# Patient Record
Sex: Male | Born: 1955 | Race: Black or African American | Hispanic: No | Smoking: Current every day smoker
Health system: Southern US, Community
[De-identification: ages and names within clinical notes are randomized; demographics above are authoritative.]

## PROBLEM LIST (undated history)

## (undated) ENCOUNTER — Emergency Department: Discharge status: Absent without leave | Payer: Medicaid Other

## (undated) DIAGNOSIS — K859 Acute pancreatitis without necrosis or infection, unspecified: Secondary | ICD-10-CM

## (undated) DIAGNOSIS — I509 Heart failure, unspecified: Secondary | ICD-10-CM

## (undated) DIAGNOSIS — I480 Paroxysmal atrial fibrillation: Secondary | ICD-10-CM

## (undated) DIAGNOSIS — I10 Essential (primary) hypertension: Secondary | ICD-10-CM

## (undated) DIAGNOSIS — Z789 Other specified health status: Secondary | ICD-10-CM

## (undated) DIAGNOSIS — I219 Acute myocardial infarction, unspecified: Secondary | ICD-10-CM

## (undated) DIAGNOSIS — U071 COVID-19: Secondary | ICD-10-CM

## (undated) DIAGNOSIS — J449 Chronic obstructive pulmonary disease, unspecified: Secondary | ICD-10-CM

## (undated) DIAGNOSIS — I2510 Atherosclerotic heart disease of native coronary artery without angina pectoris: Secondary | ICD-10-CM

## (undated) DIAGNOSIS — F1090 Alcohol use, unspecified, uncomplicated: Secondary | ICD-10-CM

## (undated) HISTORY — DX: Essential (primary) hypertension: I10

## (undated) HISTORY — DX: Atherosclerotic heart disease of native coronary artery without angina pectoris: I25.10

## (undated) HISTORY — DX: Chronic obstructive pulmonary disease, unspecified: J44.9

## (undated) HISTORY — DX: Alcohol use, unspecified, uncomplicated: F10.90

## (undated) HISTORY — PX: PARTIAL COLECTOMY: SHX5273

## (undated) HISTORY — DX: Other specified health status: Z78.9

## (undated) HISTORY — DX: Heart failure, unspecified: I50.9

---

## 2005-05-07 ENCOUNTER — Emergency Department: Payer: Self-pay | Admitting: Emergency Medicine

## 2005-07-15 ENCOUNTER — Emergency Department: Payer: Self-pay | Admitting: Emergency Medicine

## 2005-07-18 ENCOUNTER — Emergency Department: Payer: Self-pay | Admitting: Emergency Medicine

## 2005-07-25 ENCOUNTER — Emergency Department: Payer: Self-pay | Admitting: Emergency Medicine

## 2018-11-11 ENCOUNTER — Emergency Department: Payer: Medicaid Other

## 2018-11-11 ENCOUNTER — Other Ambulatory Visit: Payer: Self-pay

## 2018-11-11 ENCOUNTER — Encounter: Payer: Self-pay | Admitting: Radiology

## 2018-11-11 ENCOUNTER — Inpatient Hospital Stay
Admission: EM | Admit: 2018-11-11 | Discharge: 2018-11-25 | DRG: 871 | Disposition: A | Discharge status: Home / Self Care | Payer: Medicaid Other | Attending: Internal Medicine | Admitting: Internal Medicine

## 2018-11-11 DIAGNOSIS — E860 Dehydration: Secondary | ICD-10-CM | POA: Diagnosis present

## 2018-11-11 DIAGNOSIS — M461 Sacroiliitis, not elsewhere classified: Secondary | ICD-10-CM | POA: Diagnosis present

## 2018-11-11 DIAGNOSIS — J850 Gangrene and necrosis of lung: Secondary | ICD-10-CM | POA: Diagnosis present

## 2018-11-11 DIAGNOSIS — J690 Pneumonitis due to inhalation of food and vomit: Secondary | ICD-10-CM | POA: Diagnosis present

## 2018-11-11 DIAGNOSIS — M109 Gout, unspecified: Secondary | ICD-10-CM | POA: Diagnosis present

## 2018-11-11 DIAGNOSIS — J918 Pleural effusion in other conditions classified elsewhere: Secondary | ICD-10-CM | POA: Diagnosis present

## 2018-11-11 DIAGNOSIS — Z20828 Contact with and (suspected) exposure to other viral communicable diseases: Secondary | ICD-10-CM | POA: Diagnosis present

## 2018-11-11 DIAGNOSIS — E876 Hypokalemia: Secondary | ICD-10-CM | POA: Diagnosis present

## 2018-11-11 DIAGNOSIS — Z809 Family history of malignant neoplasm, unspecified: Secondary | ICD-10-CM

## 2018-11-11 DIAGNOSIS — J869 Pyothorax without fistula: Secondary | ICD-10-CM | POA: Diagnosis not present

## 2018-11-11 DIAGNOSIS — I248 Other forms of acute ischemic heart disease: Secondary | ICD-10-CM | POA: Diagnosis not present

## 2018-11-11 DIAGNOSIS — R1011 Right upper quadrant pain: Secondary | ICD-10-CM

## 2018-11-11 DIAGNOSIS — D65 Disseminated intravascular coagulation [defibrination syndrome]: Secondary | ICD-10-CM | POA: Diagnosis not present

## 2018-11-11 DIAGNOSIS — E43 Unspecified severe protein-calorie malnutrition: Secondary | ICD-10-CM | POA: Diagnosis present

## 2018-11-11 DIAGNOSIS — I4891 Unspecified atrial fibrillation: Secondary | ICD-10-CM | POA: Diagnosis present

## 2018-11-11 DIAGNOSIS — F17200 Nicotine dependence, unspecified, uncomplicated: Secondary | ICD-10-CM | POA: Diagnosis present

## 2018-11-11 DIAGNOSIS — F17210 Nicotine dependence, cigarettes, uncomplicated: Secondary | ICD-10-CM | POA: Diagnosis present

## 2018-11-11 DIAGNOSIS — A419 Sepsis, unspecified organism: Secondary | ICD-10-CM

## 2018-11-11 DIAGNOSIS — Z6823 Body mass index (BMI) 23.0-23.9, adult: Secondary | ICD-10-CM

## 2018-11-11 DIAGNOSIS — E872 Acidosis: Secondary | ICD-10-CM | POA: Diagnosis present

## 2018-11-11 DIAGNOSIS — K8020 Calculus of gallbladder without cholecystitis without obstruction: Secondary | ICD-10-CM | POA: Diagnosis present

## 2018-11-11 DIAGNOSIS — R7989 Other specified abnormal findings of blood chemistry: Secondary | ICD-10-CM

## 2018-11-11 DIAGNOSIS — R079 Chest pain, unspecified: Secondary | ICD-10-CM | POA: Diagnosis not present

## 2018-11-11 DIAGNOSIS — Z841 Family history of disorders of kidney and ureter: Secondary | ICD-10-CM

## 2018-11-11 DIAGNOSIS — B179 Acute viral hepatitis, unspecified: Secondary | ICD-10-CM | POA: Diagnosis not present

## 2018-11-11 DIAGNOSIS — J90 Pleural effusion, not elsewhere classified: Secondary | ICD-10-CM | POA: Diagnosis not present

## 2018-11-11 DIAGNOSIS — F1010 Alcohol abuse, uncomplicated: Secondary | ICD-10-CM | POA: Diagnosis present

## 2018-11-11 DIAGNOSIS — E46 Unspecified protein-calorie malnutrition: Secondary | ICD-10-CM | POA: Diagnosis not present

## 2018-11-11 DIAGNOSIS — A408 Other streptococcal sepsis: Secondary | ICD-10-CM | POA: Diagnosis present

## 2018-11-11 DIAGNOSIS — Z09 Encounter for follow-up examination after completed treatment for conditions other than malignant neoplasm: Secondary | ICD-10-CM

## 2018-11-11 DIAGNOSIS — B954 Other streptococcus as the cause of diseases classified elsewhere: Secondary | ICD-10-CM | POA: Diagnosis not present

## 2018-11-11 DIAGNOSIS — R945 Abnormal results of liver function studies: Secondary | ICD-10-CM | POA: Diagnosis not present

## 2018-11-11 DIAGNOSIS — K7010 Alcoholic hepatitis without ascites: Secondary | ICD-10-CM | POA: Diagnosis present

## 2018-11-11 DIAGNOSIS — Z978 Presence of other specified devices: Secondary | ICD-10-CM | POA: Diagnosis not present

## 2018-11-11 DIAGNOSIS — N179 Acute kidney failure, unspecified: Secondary | ICD-10-CM | POA: Diagnosis present

## 2018-11-11 DIAGNOSIS — R52 Pain, unspecified: Secondary | ICD-10-CM

## 2018-11-11 DIAGNOSIS — D72829 Elevated white blood cell count, unspecified: Secondary | ICD-10-CM | POA: Diagnosis not present

## 2018-11-11 DIAGNOSIS — R7401 Elevation of levels of liver transaminase levels: Secondary | ICD-10-CM | POA: Diagnosis not present

## 2018-11-11 DIAGNOSIS — K760 Fatty (change of) liver, not elsewhere classified: Secondary | ICD-10-CM | POA: Diagnosis not present

## 2018-11-11 LAB — COMPREHENSIVE METABOLIC PANEL
ALT: 309 U/L — ABNORMAL HIGH (ref 0–44)
AST: 1065 U/L — ABNORMAL HIGH (ref 15–41)
Albumin: 2.9 g/dL — ABNORMAL LOW (ref 3.5–5.0)
Alkaline Phosphatase: 141 U/L — ABNORMAL HIGH (ref 38–126)
Anion gap: 26 — ABNORMAL HIGH (ref 5–15)
BUN: 27 mg/dL — ABNORMAL HIGH (ref 8–23)
CO2: 17 mmol/L — ABNORMAL LOW (ref 22–32)
Calcium: 9.2 mg/dL (ref 8.9–10.3)
Chloride: 95 mmol/L — ABNORMAL LOW (ref 98–111)
Creatinine, Ser: 2.04 mg/dL — ABNORMAL HIGH (ref 0.61–1.24)
GFR calc Af Amer: 39 mL/min — ABNORMAL LOW (ref >60)
GFR calc non Af Amer: 34 mL/min — ABNORMAL LOW (ref >60)
Glucose, Bld: 53 mg/dL — ABNORMAL LOW (ref 70–99)
Potassium: 2.4 mmol/L — CL (ref 3.5–5.1)
Sodium: 138 mmol/L (ref 135–145)
Total Bilirubin: 2.4 mg/dL — ABNORMAL HIGH (ref 0.3–1.2)
Total Protein: 7.7 g/dL (ref 6.5–8.1)

## 2018-11-11 LAB — CBC WITH DIFFERENTIAL/PLATELET
Abs Immature Granulocytes: 0 10*3/uL (ref 0.00–0.07)
Basophils Absolute: 0 10*3/uL (ref 0.0–0.1)
Basophils Relative: 0 %
Eosinophils Absolute: 0 10*3/uL (ref 0.0–0.5)
Eosinophils Relative: 0 %
HCT: 49.1 % (ref 39.0–52.0)
Hemoglobin: 16.1 g/dL (ref 13.0–17.0)
Lymphocytes Relative: 3 %
Lymphs Abs: 0.7 10*3/uL (ref 0.7–4.0)
MCH: 31.3 pg (ref 26.0–34.0)
MCHC: 32.8 g/dL (ref 30.0–36.0)
MCV: 95.5 fL (ref 80.0–100.0)
Monocytes Absolute: 1.2 10*3/uL — ABNORMAL HIGH (ref 0.1–1.0)
Monocytes Relative: 5 %
Neutro Abs: 21.3 10*3/uL — ABNORMAL HIGH (ref 1.7–7.7)
Neutrophils Relative %: 87 %
Other: 5 %
Platelets: 363 10*3/uL (ref 150–400)
RBC: 5.14 MIL/uL (ref 4.22–5.81)
RDW: 13.9 % (ref 11.5–15.5)
WBC: 24.7 10*3/uL — ABNORMAL HIGH (ref 4.0–10.5)
nRBC: 0.1 % (ref 0.0–0.2)

## 2018-11-11 LAB — T4, FREE: Free T4: 1.27 ng/dL — ABNORMAL HIGH (ref 0.61–1.12)

## 2018-11-11 LAB — ACETAMINOPHEN LEVEL: Acetaminophen (Tylenol), Serum: 12 ug/mL (ref 10–30)

## 2018-11-11 LAB — LACTIC ACID, PLASMA
Lactic Acid, Venous: 6.7 mmol/L (ref 0.5–1.9)
Lactic Acid, Venous: 5.1 mmol/L (ref 0.5–1.9)

## 2018-11-11 LAB — TROPONIN I (HIGH SENSITIVITY): Troponin I (High Sensitivity): 41 ng/L — ABNORMAL HIGH (ref <18)

## 2018-11-11 LAB — ETHANOL: Alcohol, Ethyl (B): <10 mg/dL (ref <10)

## 2018-11-11 LAB — MAGNESIUM: Magnesium: 1.5 mg/dL — ABNORMAL LOW (ref 1.7–2.4)

## 2018-11-11 LAB — PROTIME-INR
INR: 2.5 — ABNORMAL HIGH (ref 0.8–1.2)
Prothrombin Time: 26.3 seconds — ABNORMAL HIGH (ref 11.4–15.2)

## 2018-11-11 LAB — APTT: aPTT: 39 seconds — ABNORMAL HIGH (ref 24–36)

## 2018-11-11 LAB — FIBRIN DERIVATIVES D-DIMER (ARMC ONLY): Fibrin derivatives D-dimer (ARMC): >7500 ng/mL (FEU) — ABNORMAL HIGH (ref 0.00–499.00)

## 2018-11-11 LAB — TSH: TSH: 0.985 u[IU]/mL (ref 0.350–4.500)

## 2018-11-11 MED ORDER — SODIUM CHLORIDE 0.9 % IV BOLUS
1000.0000 mL | Freq: Once | INTRAVENOUS | Status: AC
  Administered 2018-11-11: 1000 mL via INTRAVENOUS

## 2018-11-11 MED ORDER — MORPHINE SULFATE (PF) 2 MG/ML IV SOLN
2.0000 mg | INTRAVENOUS | Status: DC | PRN
  Administered 2018-11-12: 2 mg via INTRAVENOUS
  Filled 2018-11-11: qty 1

## 2018-11-11 MED ORDER — ACETAMINOPHEN 650 MG RE SUPP: 650.0000 mg | Freq: Four times a day (QID) | RECTAL | Status: DC | PRN

## 2018-11-11 MED ORDER — SODIUM CHLORIDE 0.9 % IV SOLN
2.0000 g | Freq: Once | INTRAVENOUS | Status: AC
  Administered 2018-11-11: 2 g via INTRAVENOUS
  Filled 2018-11-11 (×2): qty 2

## 2018-11-11 MED ORDER — PIPERACILLIN-TAZOBACTAM 3.375 G IVPB 30 MIN
3.3750 g | INTRAVENOUS | Status: AC
  Administered 2018-11-11: 3.375 g via INTRAVENOUS
  Filled 2018-11-11: qty 50

## 2018-11-11 MED ORDER — ONDANSETRON HCL 4 MG/2ML IJ SOLN
4.0000 mg | Freq: Once | INTRAMUSCULAR | Status: AC
  Administered 2018-11-11: 21:00:00 4 mg via INTRAVENOUS

## 2018-11-11 MED ORDER — NICOTINE 21 MG/24HR TD PT24: 21.0000 mg | MEDICATED_PATCH | Freq: Every day | TRANSDERMAL | Status: DC

## 2018-11-11 MED ORDER — ONDANSETRON HCL 4 MG/2ML IJ SOLN
INTRAMUSCULAR | Status: AC
  Administered 2018-11-11: 4 mg via INTRAVENOUS
  Filled 2018-11-11: qty 2

## 2018-11-11 MED ORDER — ACETAMINOPHEN 325 MG PO TABS: 650.0000 mg | ORAL_TABLET | Freq: Four times a day (QID) | ORAL | Status: DC | PRN

## 2018-11-11 MED ORDER — IOHEXOL 350 MG/ML SOLN
75.0000 mL | Freq: Once | INTRAVENOUS | Status: AC | PRN
  Administered 2018-11-11: 60 mL via INTRAVENOUS

## 2018-11-11 MED ORDER — MAGNESIUM SULFATE 2 GM/50ML IV SOLN
2.0000 g | Freq: Once | INTRAVENOUS | Status: AC
  Administered 2018-11-11: 2 g via INTRAVENOUS
  Filled 2018-11-11: qty 50

## 2018-11-11 MED ORDER — DILTIAZEM HCL 25 MG/5ML IV SOLN
15.0000 mg | Freq: Once | INTRAVENOUS | Status: AC
  Administered 2018-11-11: 15 mg via INTRAVENOUS

## 2018-11-11 MED ORDER — ENOXAPARIN SODIUM 40 MG/0.4ML ~~LOC~~ SOLN: 40.0000 mg | SUBCUTANEOUS | Status: DC

## 2018-11-11 MED ORDER — POTASSIUM CHLORIDE CRYS ER 20 MEQ PO TBCR
40.0000 meq | EXTENDED_RELEASE_TABLET | Freq: Once | ORAL | Status: AC
  Administered 2018-11-11: 40 meq via ORAL
  Filled 2018-11-11: qty 2

## 2018-11-11 MED ORDER — ONDANSETRON HCL 4 MG PO TABS: 4.0000 mg | ORAL_TABLET | Freq: Four times a day (QID) | ORAL | Status: DC | PRN

## 2018-11-11 MED ORDER — VANCOMYCIN HCL 10 G IV SOLR
1750.0000 mg | Freq: Once | INTRAVENOUS | Status: AC
  Administered 2018-11-11: 23:00:00 1750 mg via INTRAVENOUS
  Filled 2018-11-11 (×2): qty 1750

## 2018-11-11 MED ORDER — ALUM & MAG HYDROXIDE-SIMETH 200-200-20 MG/5ML PO SUSP
30.0000 mL | Freq: Once | ORAL | Status: AC
  Administered 2018-11-11: 30 mL via ORAL
  Filled 2018-11-11: qty 30

## 2018-11-11 MED ORDER — OXYCODONE HCL 5 MG/5ML PO SOLN
5.0000 mg | Freq: Once | ORAL | Status: AC
  Administered 2018-11-11: 5 mg via ORAL
  Filled 2018-11-11: qty 5

## 2018-11-11 MED ORDER — METRONIDAZOLE IN NACL 5-0.79 MG/ML-% IV SOLN
500.0000 mg | Freq: Once | INTRAVENOUS | Status: DC
  Filled 2018-11-11: qty 100

## 2018-11-11 MED ORDER — POTASSIUM CHLORIDE 10 MEQ/100ML IV SOLN
10.0000 meq | INTRAVENOUS | Status: AC
  Filled 2018-11-11 (×3): qty 100

## 2018-11-11 MED ORDER — POTASSIUM CHLORIDE IN NACL 20-0.9 MEQ/L-% IV SOLN
INTRAVENOUS | Status: DC
  Administered 2018-11-12 – 2018-11-14 (×3): via INTRAVENOUS
  Filled 2018-11-11 (×10): qty 1000

## 2018-11-11 MED ORDER — DILTIAZEM HCL 25 MG/5ML IV SOLN
25.0000 mg | Freq: Once | INTRAVENOUS | Status: AC
  Administered 2018-11-11: 15 mg via INTRAVENOUS

## 2018-11-11 MED ORDER — VANCOMYCIN HCL IN DEXTROSE 1-5 GM/200ML-% IV SOLN
1000.0000 mg | Freq: Once | INTRAVENOUS | Status: DC
  Filled 2018-11-11: qty 200

## 2018-11-11 MED ORDER — ONDANSETRON HCL 4 MG/2ML IJ SOLN: 4.0000 mg | Freq: Four times a day (QID) | INTRAMUSCULAR | Status: DC | PRN

## 2018-11-11 MED ORDER — POTASSIUM CHLORIDE 10 MEQ/100ML IV SOLN
10.0000 meq | INTRAVENOUS | Status: AC
  Administered 2018-11-11 – 2018-11-12 (×4): 10 meq via INTRAVENOUS
  Filled 2018-11-11 (×4): qty 100

## 2018-11-11 NOTE — H&P (Addendum)
[facility] [place] [place] NAME: Thomas Coffey    MR#:  [id number]  DATE OF BIRTH:  [birth year]  DATE OF ADMISSION:  11/11/2018  PRIMARY CARE PHYSICIAN: Patient, [name]   REQUESTING/REFERRING PHYSICIAN: Dr. Marjean Donna  CHIEF COMPLAINT:   Chief Complaint  Patient presents with   Chest Pain    HISTORY OF PRESENT ILLNESS:  Thomas Coffey  is a 63 y.o. male with history of tobacco abuse who presents to the hospital complaining of vague epigastric/right upper quadrant/chest pain.  Patient says he was in his usual state of health and developed worsening symptoms yesterday late evening which progressed today and he had associated vomiting twice today.  His vomitus was nonbloody and bilious in nature.  Patient was not feeling better and therefore came to the ER for further evaluation.  In the emergency room patient underwent blood work which showed acute kidney injury with abnormal LFTs and ultrasound findings suggestive of suspected cholelithiasis with cholecystitis but given his acute kidney injury and the fact that he was in A. fib with RVR surgery recommended admission by hospitalist services.  Patient was given 1 dose of IV Cardizem in the ER for his atrial fibrillation and he has now converted to normal sinus rhythm.  Given patient's elevated D-dimer and atypical symptoms he underwent a CTA of the chest abdomen pelvis which is not suggestive of a necrotizing left-sided pneumonia with empyema.  Patient does admit to some exertional dyspnea, no night sweats, chills but no documented fever.  Given his abnormal imaging findings and blood work hospitalist services were contacted for admission.  PAST MEDICAL HISTORY:  History reviewed. No pertinent past medical history.  PAST SURGICAL HISTORY:  None  SOCIAL HISTORY:   Social History   Tobacco Use   Smoking status: Current Every Day Smoker    Packs/day: 0.50    Years: 30.00    Pack years: 15.00     Types: Cigarettes  Substance Use Topics   Alcohol use: Yes    Comment: socially    FAMILY HISTORY:   Family History  Problem Relation Age of Onset   Cancer Mother    Kidney disease Brother     DRUG ALLERGIES:  No Known Allergies  REVIEW OF SYSTEMS:   Review of Systems  Constitutional: Negative for fever and weight loss.  HENT: Negative for congestion, nosebleeds and tinnitus.   Eyes: Negative for blurred vision, double vision and redness.  Respiratory: Negative for cough, hemoptysis and shortness of breath.   Cardiovascular: Negative for chest pain, orthopnea, leg swelling and PND.  Gastrointestinal: Positive for abdominal pain, nausea and vomiting. Negative for diarrhea and melena.  Genitourinary: Negative for dysuria, hematuria and urgency.  Musculoskeletal: Negative for falls and joint pain.  Neurological: Negative for dizziness, tingling, sensory change, focal weakness, seizures, weakness and headaches.  Endo/Heme/Allergies: Negative for polydipsia. Does not bruise/bleed easily.  Psychiatric/Behavioral: Negative for depression and memory loss. The patient is not nervous/anxious.     MEDICATIONS AT HOME:   Prior to Admission medications   Medication Sig Start Date End Date Taking? Authorizing Provider  acetaminophen-codeine (TYLENOL #3) 300-30 MG tablet Take 1 tablet by mouth every 6 (six) hours as needed for pain. 11/08/18 11/13/18 Yes [provider]      VITAL SIGNS:  Blood pressure 101/60, pulse (!) 108, resp. rate (!) 22, weight 79.4 kg, SpO2 91 %.  PHYSICAL EXAMINATION:  Physical Exam  GENERAL:  63 y.o.-year-old patient lying  in the bed in no acute distress.  EYES: Pupils equal, round, reactive to light and accommodation. No scleral icterus. Extraocular muscles intact.  HEENT: Head atraumatic, normocephalic. Oropharynx and nasopharynx clear. No oropharyngeal erythema, moist oral mucosa  NECK:  Supple, no jugular venous distention. No thyroid  enlargement, no tenderness.  LUNGS: Normal breath sounds bilaterally, no wheezing, rales, rhonchi. No use of accessory muscles of respiration.  CARDIOVASCULAR: S1, S2 RRR. No murmurs, rubs, gallops, clicks.  ABDOMEN: Soft, Tender in RUQ, Epigastric area. No rebound, rigidity, nondistended. Bowel sounds present. No organomegaly or mass.  EXTREMITIES: No pedal edema, cyanosis, or clubbing. + 2 pedal & radial pulses b/l.   NEUROLOGIC: Cranial nerves II through XII are intact. No focal Motor or sensory deficits appreciated b/l PSYCHIATRIC: The patient is alert and oriented x 3.  SKIN: No obvious rash, lesion, or ulcer.   LABORATORY PANEL:   CBC Recent Labs  Lab 11/11/18 1836  WBC 24.7*  HGB 16.1  HCT 49.1  PLT 363   ------------------------------------------------------------------------------------------------------------------  Chemistries  Recent Labs  Lab 11/11/18 1836  NA 138  K 2.4*  CL 95*  CO2 17*  GLUCOSE 53*  BUN 27*  CREATININE 2.04*  CALCIUM 9.2  MG 1.5*  AST 1,065*  ALT 309*  ALKPHOS 141*  BILITOT 2.4*   ------------------------------------------------------------------------------------------------------------------  Cardiac Enzymes No results for input(s): TROPONINI in the last 168 hours. ------------------------------------------------------------------------------------------------------------------  RADIOLOGY:  Dg Chest 1 View  Result Date: 11/11/2018 CLINICAL DATA:  Chest pain EXAM: CHEST  1 VIEW COMPARISON:  07/15/2005 FINDINGS: Defibrillator pads overlie the left lower chest. Increased left basilar ill-defined opacity obscures the left hemidiaphragm may represent atelectasis and/or consolidation. Difficult to exclude left lower lung pneumonia. Right lung remains clear. Normal heart size and vascularity. No large effusion or pneumothorax. Trachea midline. Aorta atherosclerotic. Degenerative changes of the spine. IMPRESSION: Increased left lower lung  ill-defined opacity compatible with atelectasis or developing airspace disease. Electronically Signed   By: Judie Petit.  Shick M.D.   On: 11/11/2018 19:28   Ct Angio Chest Pe W And/or Wo Contrast  Result Date: 11/11/2018 CLINICAL DATA:  Abdominal pain and complex chest pain. EXAM: CT ANGIOGRAPHY CHEST CT ABDOMEN AND PELVIS WITH CONTRAST TECHNIQUE: Multidetector CT imaging of the chest was performed using the standard protocol during bolus administration of intravenous contrast. Multiplanar CT image reconstructions and MIPs were obtained to evaluate the vascular anatomy. Multidetector CT imaging of the abdomen and pelvis was performed using the standard protocol during bolus administration of intravenous contrast. CONTRAST:  45mL OMNIPAQUE IOHEXOL 350 MG/ML SOLN COMPARISON:  CT dated July 15, 2005 FINDINGS: CTA CHEST FINDINGS Cardiovascular: Contrast injection is sufficient to demonstrate satisfactory opacification of the pulmonary arteries to the segmental level. There is no pulmonary embolus. The main pulmonary artery is within normal limits for size. There is no CT evidence of acute right heart strain. The visualized aorta is normal. Heart size is normal, without pericardial effusion. Mediastinum/Nodes: --there is some mildly prominent mediastinal lymph nodes, likely reactive. --No axillary lymphadenopathy. --No supraclavicular lymphadenopathy. --Normal thyroid gland. --The esophagus is unremarkable Lungs/Pleura: There is a large complex left-sided empyema with multiple pockets of gas scattered throughout the collection both in a dependent and nondependent location. There is consolidation of the left lower lobe with findings raising concern for necrotizing pneumonia. Moderate emphysematous changes are noted bilaterally. There is a trace right-sided pleural effusion there is some mild interlobular septal thickening. There is some debris within the trachea. Musculoskeletal: No chest wall abnormality. No  acute or  significant osseous findings. Review of the MIP images confirms the above findings. CT ABDOMEN and PELVIS FINDINGS Hepatobiliary: There is decreased hepatic attenuation suggestive of hepatic steatosis. Normal gallbladder.There is no biliary ductal dilation. Pancreas: Normal contours without ductal dilatation. No peripancreatic fluid collection. Spleen: No splenic laceration or hematoma. Adrenals/Urinary Tract: --Adrenal glands: No adrenal hemorrhage. --Right kidney/ureter: No hydronephrosis or perinephric hematoma. --Left kidney/ureter: No hydronephrosis or perinephric hematoma. --Urinary bladder: Unremarkable. Stomach/Bowel: --Stomach/Duodenum: There is moderate distention of the stomach with an air-fluid level --Small bowel: No dilatation or inflammation. --Colon: The patient is status post right hemicolectomy. --Appendix: Surgically absent. Vascular/Lymphatic: Normal course and caliber of the major abdominal vessels. --No retroperitoneal lymphadenopathy. --No mesenteric lymphadenopathy. --No pelvic or inguinal lymphadenopathy. Reproductive: Unremarkable Other: No ascites or free air. The abdominal wall is normal. Musculoskeletal. No acute displaced fractures. Review of the MIP images confirms the above findings. IMPRESSION: 1. Large complex left-sided empyema. 2. Consolidation of the left lower lobe with findings raising concern for necrotizing pneumonia. 3. Trace right-sided pleural effusion. 4. No acute intra-abdominal or pelvic process. 5. Hepatic steatosis. 6. Moderate distention of the stomach. 7. Status post right hemicolectomy. Aortic Atherosclerosis (ICD10-I70.0) and Emphysema (ICD10-J43.9). Electronically Signed   By: Katherine Mantle M.D.   On: 11/11/2018 21:37   Ct Abdomen Pelvis W Contrast  Result Date: 11/11/2018 CLINICAL DATA:  Abdominal pain and complex chest pain. EXAM: CT ANGIOGRAPHY CHEST CT ABDOMEN AND PELVIS WITH CONTRAST TECHNIQUE: Multidetector CT imaging of the chest was performed  using the standard protocol during bolus administration of intravenous contrast. Multiplanar CT image reconstructions and MIPs were obtained to evaluate the vascular anatomy. Multidetector CT imaging of the abdomen and pelvis was performed using the standard protocol during bolus administration of intravenous contrast. CONTRAST:  18mL OMNIPAQUE IOHEXOL 350 MG/ML SOLN COMPARISON:  CT dated July 15, 2005 FINDINGS: CTA CHEST FINDINGS Cardiovascular: Contrast injection is sufficient to demonstrate satisfactory opacification of the pulmonary arteries to the segmental level. There is no pulmonary embolus. The main pulmonary artery is within normal limits for size. There is no CT evidence of acute right heart strain. The visualized aorta is normal. Heart size is normal, without pericardial effusion. Mediastinum/Nodes: --there is some mildly prominent mediastinal lymph nodes, likely reactive. --No axillary lymphadenopathy. --No supraclavicular lymphadenopathy. --Normal thyroid gland. --The esophagus is unremarkable Lungs/Pleura: There is a large complex left-sided empyema with multiple pockets of gas scattered throughout the collection both in a dependent and nondependent location. There is consolidation of the left lower lobe with findings raising concern for necrotizing pneumonia. Moderate emphysematous changes are noted bilaterally. There is a trace right-sided pleural effusion there is some mild interlobular septal thickening. There is some debris within the trachea. Musculoskeletal: No chest wall abnormality. No acute or significant osseous findings. Review of the MIP images confirms the above findings. CT ABDOMEN and PELVIS FINDINGS Hepatobiliary: There is decreased hepatic attenuation suggestive of hepatic steatosis. Normal gallbladder.There is no biliary ductal dilation. Pancreas: Normal contours without ductal dilatation. No peripancreatic fluid collection. Spleen: No splenic laceration or hematoma.  Adrenals/Urinary Tract: --Adrenal glands: No adrenal hemorrhage. --Right kidney/ureter: No hydronephrosis or perinephric hematoma. --Left kidney/ureter: No hydronephrosis or perinephric hematoma. --Urinary bladder: Unremarkable. Stomach/Bowel: --Stomach/Duodenum: There is moderate distention of the stomach with an air-fluid level --Small bowel: No dilatation or inflammation. --Colon: The patient is status post right hemicolectomy. --Appendix: Surgically absent. Vascular/Lymphatic: Normal course and caliber of the major abdominal vessels. --No retroperitoneal lymphadenopathy. --No mesenteric lymphadenopathy. --No pelvic or inguinal  lymphadenopathy. Reproductive: Unremarkable Other: No ascites or free air. The abdominal wall is normal. Musculoskeletal. No acute displaced fractures. Review of the MIP images confirms the above findings. IMPRESSION: 1. Large complex left-sided empyema. 2. Consolidation of the left lower lobe with findings raising concern for necrotizing pneumonia. 3. Trace right-sided pleural effusion. 4. No acute intra-abdominal or pelvic process. 5. Hepatic steatosis. 6. Moderate distention of the stomach. 7. Status post right hemicolectomy. Aortic Atherosclerosis (ICD10-I70.0) and Emphysema (ICD10-J43.9). Electronically Signed   By: Katherine Mantlehristopher  Green M.D.   On: 11/11/2018 21:37   [place] Abdomen Limited Ruq  Result Date: 11/11/2018 CLINICAL DATA:  Pain EXAM: ULTRASOUND ABDOMEN LIMITED RIGHT UPPER QUADRANT COMPARISON:  None. FINDINGS: Gallbladder: Multiple gallstones are noted. There is gallbladder wall thickening with the gallbladder wall measuring approximately 4 mm. The sonographic Eulah PontMurphy sign is negative. There is no pericholecystic free fluid. Common bile duct: Diameter: 4 mm Liver: No focal lesion identified. Within normal limits in parenchymal echogenicity. Portal vein is patent on color Doppler imaging with normal direction of blood flow towards the liver. Other: None. IMPRESSION:  Cholelithiasis with mild gallbladder wall thickening. In the absence of a positive sonographic Murphy sign, these findings are equivocal for acute cholecystitis. If there is high clinical suspicion for acute cholecystitis, follow-up with HIDA scan is recommended. Electronically Signed   By: Katherine Mantlehristopher  Green M.D.   On: 11/11/2018 20:28     IMPRESSION AND PLAN:   63 year old male with past medical history of tobacco abuse who presents to the hospital complaining of chest/abdominal pain.  1.  Sepsis-patient meets criteria given the patient's leukocytosis and CT scan findings suggestive of necrotizing pneumonia empyema. -We will treat the patient with broad-spectrum IV antibiotics with vancomycin, Zosyn.  Follow cultures. -Patient is presently afebrile and hemodynamically stable.  2.  Necrotizing pneumonia/empyema-source of patient's sepsis. -We will treat the patient with broad-spectrum IV antibiotics with vancomycin, Zosyn. -We will order a ultrasound-guided diagnostic thoracentesis tomorrow. -We will also get a cardiothoracic surgery consult with Dr. Thelma Bargeaks to see if patient would benefit from a chest tube. -Consider pulmonary consult if needed.  3.  Right upper quadrant/epigastric abdominal pain with abnormal LFTs-patient's right upper quadrant ultrasound was suggestive of cholelithiasis with mild gallbladder wall thickening.  CBD was normal in dimension. - ?? Alcohol abuse but he denies.  Will check Hepatitis Panel.  -We will treat the patient empirically with IV Zosyn, get MRCP. -We will also get a gastroenterology and general surgical consult.  I have sent message to Dr. Servando SnareWohl and also Dr. Aleen CampiPiscoya via [facility].  -Continue supportive care with pain control, IV fluids, antiemetics. -Follow LFTs.  4.  Acute kidney injury-secondary to nausea vomiting and dehydration. -We will hydrate the patient with IV fluids, follow BUN/creatinine urine output.  5.  Hypokalemia/hypomagnesemia -secondary to  nausea vomiting. -We will replace the potassium magnesium intravenously and repeat level in the morning.  6. A. Fib with RVR -presented to the hospital with heart rates in the 180s and thought to be in A. fib with RVR/SVT.  Patient given 1 dose of IV Cardizem and has now converted to a sinus rhythm.  This was likely related to sepsis/electrolyte abnormalities as stated above. -Patient currently is in sinus rhythm, continue supportive care to treat underlying infection for now. -I will check an echocardiogram.  6.  Tobacco abuse-we will place the patient on nicotine patch.   All the records are reviewed and case discussed with ED provider. Management plans discussed with the patient, family and  they are in agreement.  CODE STATUS: Full code  TOTAL TIME TAKING CARE OF THIS PATIENT: 45 minutes.    Houston SirenVivek J Gertha Lichtenberg M.D on 11/11/2018 at 9:51 PM  Between 7am to 6pm - Pager - [phone]  After 6pm go to [email or web address] - password [email or web address]  [facility]io [facility]  Office  [phone]  CC: Primary care physician; Patient, [name]

## 2018-11-11 NOTE — ED Notes (Addendum)
Date and time results received: 11/11/18 7:30 PM    Test: Potassium Critical Value: 2.4  Name of Provider Notified: Dr. Jari Pigg

## 2018-11-11 NOTE — ED Notes (Addendum)
Attempted to call report/ RN unable to accept report at this time Secretary was given this RN's number to call

## 2018-11-11 NOTE — Progress Notes (Signed)
11/11/18 8:45 pm  Dr. Jari Pigg paged about this patient.  63 yo male presented with chest pain and abdominal pain that started yesterday.  He had ultrasound which showed cholelithiasis with wall thickness of 4 mm, but no pericholecystic fluid.  CBD normal at 4 mm as well.  However, his lab work has significant electrolyte derangement with K 2.4, Mg 1.5, Cl 95, CO2 17, Cr 2.04, and elevated LFTs with total bilirubin of 2.4, AST 1065, ALT 309, and alk phos 141.  He does drink but alcohol level was < 10.  WBC 24.7 with some hemoconcentration.  He is also in atrial fibrillation.  He is being admitted to medicine.  We'll follow for possible cholecystitis, possible choledocholithiasis.  Recommend GI consult given elevated LFTs.  Anticipate he would need MRCP for further evaluation of his biliary anatomy.  Will add lipase to his labs to evaluate for any possible gallstone pancreatitis given his abdominal pain and elevated LFTs as well.  Full consult note to follow in AM.   Olean Ree, MD

## 2018-11-11 NOTE — ED Notes (Signed)
Pharmacy called regarding missing Cefepime medication, informed it would be sent up.

## 2018-11-11 NOTE — Progress Notes (Signed)
PHARMACY -  BRIEF ANTIBIOTIC NOTE   Pharmacy has received consult(s) for Vancomycin and Cefepime from an ED provider.  The patient's profile has been reviewed for ht/wt/allergies/indication/available labs.    One time order(s) placed for Vancomycin 1,750mg  IV x 1 and Cefepime 2g x 1.  Further antibiotics/pharmacy consults should be ordered by admitting physician if indicated.                       Thank you, Pearla Dubonnet 11/11/2018  8:03 PM

## 2018-11-11 NOTE — ED Notes (Addendum)
MD Jari Pigg at bedside. Pt states he has right lower back pain for approx 1 year. Pt states NV, SOB, dizziness prior to arrival and chest pain that he rates at an 8. Pt's initial HR 189. Pt's HR after 15 of diltiazem in the 150s. Pt refusing to wear O2 which was placed for comfort.

## 2018-11-11 NOTE — ED Triage Notes (Signed)
Pt to ED from home c/o SOB that started yesterday and pain to chest and back.  Pt appears in respiratory distress and pain.  Pt wheeled back immediately after EKG in triage and Dr. Jari Pigg at bedside.

## 2018-11-11 NOTE — ED Notes (Addendum)
Pt asking for O2. Pt placed on 2L O2 per MD Jari Pigg for comfort.

## 2018-11-11 NOTE — ED Notes (Signed)
Patient transported to CT at this time. 

## 2018-11-11 NOTE — Progress Notes (Signed)
Received from ER to room 242 via stretcher at 2330. Assisted to bed and positioned for comfort. Oriented to room, bed and unit.

## 2018-11-11 NOTE — ED Notes (Signed)
Pt has  2 IVs established and antibiotics ordered. Pt is a difficult stick, only able to obtain 1 set of cultures at this time.

## 2018-11-11 NOTE — ED Provider Notes (Signed)
[facility] Emergency Department Provider Note  ____________________________________________   First MD Initiated Contact with Patient 11/11/18 1835     (approximate)  I have reviewed the triage vital signs and the nursing notes.   HISTORY  Chief Complaint Chest Pain    HPI Thomas Coffey is a 63 y.o. male who denies any medical history but has had frequent presentations [facility] for multiple issues who was last seen there 4 days ago for hip pain.  Patient had negative MRI.  Patient presents today for chest pain that started yesterday.  The pain was in the middle of his chest, severe, nothing makes better, nothing makes it worse.  It was associated with severe shortness of breath.  Denies any fevers.  Medical: Denies any medical history Surgical: No heart surgeries Social: Uses alcohol but denies any today.  Denies drugs  Review of Systems Constitutional: No fever/chills Eyes: No visual changes. ENT: No sore throat. Cardiovascular: Positive chest pain Respiratory: Positive shortness of breath Gastrointestinal: No abdominal pain.  No nausea, no vomiting.  No diarrhea.  No constipation. Genitourinary: Negative for dysuria. Musculoskeletal: Negative for back pain. Skin: Negative for rash. Neurological: Negative for headaches, focal weakness or numbness. All other ROS negative ____________________________________________   PHYSICAL EXAM:  VITAL SIGNS: ED Triage Vitals  Enc Vitals Group     BP 11/11/18 1805 (!) 122/102     Pulse Rate 11/11/18 1805 (!) 189     Resp 11/11/18 1805 (!) 32     Temp --      Temp src --      SpO2 11/11/18 1805 96 %     Weight 11/11/18 1833 175 lb (79.4 kg)     Height --      Head Circumference --      Peak Flow --      Pain Score 11/11/18 1821 10     Pain Loc --      Pain Edu? --      Excl. in GC? --     Constitutional: Alert and oriented.  Eyes: Conjunctivae are normal. EOMI. Head: Atraumatic.  Nose: No congestion/rhinnorhea. Mouth/Throat: Mucous membranes are moist.  Poor dentition Neck: No stridor. Trachea Midline. FROM Cardiovascular: Irregular and tachycardic, grossly normal heart sounds.  Good peripheral circulation. Respiratory: Normal respiratory effort.  No retractions. Lungs CTAB. Gastrointestinal: Soft and nontender. No distention. No abdominal bruits.  Musculoskeletal: No lower extremity tenderness nor edema.  No joint effusions. Neurologic:  Normal speech and language. No gross focal neurologic deficits are appreciated.  Skin:  Skin is warm, dry and intact. No rash noted. Psychiatric: Mood and affect are normal. Speech and behavior are normal. GU: Deferred  Back: Flank tenderness (present for 2 years)  ____________________________________________   LABS (all labs ordered are listed, but only abnormal results are displayed)  Labs Reviewed  CBC WITH DIFFERENTIAL/PLATELET  ETHANOL  COMPREHENSIVE METABOLIC PANEL  MAGNESIUM  FIBRIN DERIVATIVES D-DIMER ([facility] ONLY)  TSH  T4, FREE  URINALYSIS, ROUTINE W REFLEX MICROSCOPIC  TROPONIN I (HIGH SENSITIVITY)   ____________________________________________   ED ECG REPORT I, Concha SeMary E Leocadia Idleman, the attending physician, personally viewed and interpreted this ECG.  EKG A. fib with a rate of 195, no ST elevation, no T wave inversion, 2 PVCs, otherwise normal intervals  EKG atrial fib rate of 146, no ST elevation, no T wave inversions, QTC slightly prolonged at 515 ____________________________________________  RADIOLOGY Vela ProseI, Bhavin Monjaraz E Daleon Willinger, personally viewed and evaluated these images (plain radiographs)  as part of my medical decision making, as well as reviewing the written report by the radiologist.  ED MD interpretation:  No pna   Official radiology report(s): Dg Chest 1 View  Result Date: 11/11/2018 CLINICAL DATA:  Chest pain EXAM: CHEST  1 VIEW COMPARISON:  07/15/2005 FINDINGS: Defibrillator pads overlie the left lower  chest. Increased left basilar ill-defined opacity obscures the left hemidiaphragm may represent atelectasis and/or consolidation. Difficult to exclude left lower lung pneumonia. Right lung remains clear. Normal heart size and vascularity. No large effusion or pneumothorax. Trachea midline. Aorta atherosclerotic. Degenerative changes of the spine. IMPRESSION: Increased left lower lung ill-defined opacity compatible with atelectasis or developing airspace disease. Electronically Signed   By: Jerilynn Mages.  Shick M.D.   On: 11/11/2018 19:28   US Abdomen Limited Ruq  Result Date: 11/11/2018 CLINICAL DATA:  Pain EXAM: ULTRASOUND ABDOMEN LIMITED RIGHT UPPER QUADRANT COMPARISON:  None. FINDINGS: Gallbladder: Multiple gallstones are noted. There is gallbladder wall thickening with the gallbladder wall measuring approximately 4 mm. The sonographic Percell Miller sign is negative. There is no pericholecystic free fluid. Common bile duct: Diameter: 4 mm Liver: No focal lesion identified. Within normal limits in parenchymal echogenicity. Portal vein is patent on color Doppler imaging with normal direction of blood flow towards the liver. Other: None. IMPRESSION: Cholelithiasis with mild gallbladder wall thickening. In the absence of a positive sonographic Murphy sign, these findings are equivocal for acute cholecystitis. If there is high clinical suspicion for acute cholecystitis, follow-up with HIDA scan is recommended. Electronically Signed   By: Constance Holster M.D.   On: 11/11/2018 20:28    ____________________________________________   PROCEDURES  Procedure(s) performed (including Critical Care):  .Critical Care Performed by: Vanessa Dayton, MD Authorized by: Vanessa [place], MD   Critical care provider statement:    Critical care time (minutes):  45   Critical care was necessary to treat or prevent imminent or life-threatening deterioration of the following conditions:  Cardiac failure   Critical care was time spent  personally by me on the following activities:  Discussions with consultants, evaluation of patient's response to treatment, examination of patient, ordering and performing treatments and interventions, ordering and review of laboratory studies, ordering and review of radiographic studies, pulse oximetry, re-evaluation of patient's condition, obtaining history from patient or surrogate and review of old charts     ____________________________________________   INITIAL IMPRESSION / Center / ED COURSE   Thomas Coffey was evaluated in Emergency Department on 11/11/2018 for the symptoms described in the history of present illness. He was evaluated in the context of the global COVID-19 pandemic, which necessitated consideration that the patient might be at risk for infection with the SARS-CoV-2 virus that causes COVID-19. Institutional protocols and algorithms that pertain to the evaluation of patients at risk for COVID-19 are in a state of rapid change based on information released by regulatory bodies including the CDC and federal and state organizations. These policies and algorithms were followed during the patient's care in the ED.    This is most concerning for A. fib with RVR.  Patient initially normotensive and given 15 mg of diltiazem.  Afterwards patient became hypotensive and 1 L of fluids was infused.  I discussed with patient cardioversion and he converted to normal sinus during this conversation.  He denies alcohol use today.  He does have some right flank pain that he says been going on for years now.  We will do work-up for his A. fib  but anticipate admission for new A. fib with RVR.   DDx that was also considered d/t potential to cause harm, but was found less likely based on history and physical (as detailed above): -PNA (no fevers, cough but CXR to evaluate) -PNX (reassured with equal b/l breath sounds, CXR to evaluate) -Symptomatic anemia (will get H&H) -Pulmonary  embolism as no sob at rest, not pleuritic in nature, no hypoxia -Aortic Dissection as no tearing pain and no radiation to the mid back, pulses equal -Pericarditis no rub on exam, EKG changes or hx to suggest dx -Tamponade (no notable SOB, tachycardic, hypotensive) -Esophageal rupture (no h/o diffuse vomitting/no crepitus)   6:51 PM repeat EKG sinus tachycardia rate of 108, no ST elevation, no T wave inversions, normal intervals.  Labs show a K 2.4 will begin to repleat. Significant elevated LFTS with T bili 2.4 AG 26. Mag 1.5.   7:38 PM given patient's elevated heart rate in conjunction with the white count of 24,000 we will do a sepsis alert and start patient on broad-spectrum antibiotics.  Review of patient's labs show at [facility] on 10/23 he had normal LFTs.  This concerning for possible cholecystitis.  Will get ultrasound to further evaluate.  8:36 PM ultrasound is concerning for cholecystitis with cholelithiasis and gallbladder wall thickening but given the abnormal LFTs that are new I am concerned about cholecystitis or choledocholithiasis.  D/w Dr Aleen Campi surgery and agree with plan for admission for mrcp added on lipase.  Is difficult to assess for patient's pain is given he has chronic pain all over.  Took 3g tylenol yesterday denies more then 4g in one day.  Added on tylenol level.   Given the elevated D-dimer will get PE scan.  Also get CT abdomen given the elevated white count to make sure that were not missing any of of a chronic infection.  Discussed with Lanora Manis from the hospital team and she will admit patient.     ____________________________________________   FINAL CLINICAL IMPRESSION(S) / ED DIAGNOSES   Final diagnoses:  Atrial fibrillation with rapid ventricular response (HCC)  RUQ pain  Sepsis, due to unspecified organism, unspecified whether acute organ dysfunction present [facility])  Liver function test abnormality  AKI (acute kidney injury) (HCC)      MEDICATIONS GIVEN DURING THIS VISIT:  Medications  potassium chloride 10 mEq in 100 mL IVPB (has no administration in time range)  magnesium sulfate IVPB 2 g 50 mL (has no administration in time range)  sodium chloride 0.9 % bolus 1,000 mL (has no administration in time range)  ceFEPIme (MAXIPIME) 2 g in sodium chloride 0.9 % 100 mL IVPB (has no administration in time range)  metroNIDAZOLE (FLAGYL) IVPB 500 mg (has no administration in time range)  vancomycin (VANCOCIN) 1,750 mg in sodium chloride 0.9 % 500 mL IVPB (has no administration in time range)  diltiazem (CARDIZEM) injection 15 mg (15 mg Intravenous Given 11/11/18 1814)  diltiazem (CARDIZEM) injection 25 mg (15 mg Intravenous Given 11/11/18 1811)  sodium chloride 0.9 % bolus 1,000 mL (1,000 mLs Intravenous New Bag/Given 11/11/18 1811)  potassium chloride SA (KLOR-CON) CR tablet 40 mEq (40 mEq Oral Given 11/11/18 1959)  oxyCODONE (ROXICODONE) 5 MG/5ML solution 5 mg (5 mg Oral Given 11/11/18 1956)  ondansetron (ZOFRAN) injection 4 mg (4 mg Intravenous Given 11/11/18 2051)     ED Discharge Orders    None       Note:  This document was prepared using Dragon voice recognition software and may include  unintentional dictation errors.   Concha Se, MD 11/11/18 2056

## 2018-11-11 NOTE — ED Notes (Signed)
Date and time results received: 11/11/18 10:10 PM    Test: Lactic  Critical Value: 6.7  Name of Provider Notified: Dr. Quentin Cornwall   .

## 2018-11-11 NOTE — ED Notes (Signed)
Pt lying in bed, eyes open, A&Ox4. PT in NAD at this time. Informed that family requested to visit with pt, family notified that they are able to visit at this time. Pt reports 7/10 pain in his left lateral thigh r/t muscle tear that was followed up at [facility]. MD notified of pain mediation request. Pt st he "feels a lot better and can breathe easier".

## 2018-11-12 ENCOUNTER — Inpatient Hospital Stay: Payer: Medicaid Other

## 2018-11-12 ENCOUNTER — Inpatient Hospital Stay (HOSPITAL_COMMUNITY)
Admit: 2018-11-12 | Discharge: 2018-11-12 | Disposition: A | Discharge status: Home / Self Care | Payer: Medicaid Other | Attending: Specialist | Admitting: Specialist

## 2018-11-12 DIAGNOSIS — J869 Pyothorax without fistula: Secondary | ICD-10-CM

## 2018-11-12 DIAGNOSIS — R079 Chest pain, unspecified: Secondary | ICD-10-CM | POA: Diagnosis not present

## 2018-11-12 DIAGNOSIS — B179 Acute viral hepatitis, unspecified: Secondary | ICD-10-CM

## 2018-11-12 LAB — CBC
HCT: 34.7 % — ABNORMAL LOW (ref 39.0–52.0)
HCT: 34.2 % — ABNORMAL LOW (ref 39.0–52.0)
Hemoglobin: 12.1 g/dL — ABNORMAL LOW (ref 13.0–17.0)
Hemoglobin: 11.4 g/dL — ABNORMAL LOW (ref 13.0–17.0)
MCH: 31.8 pg (ref 26.0–34.0)
MCH: 31 pg (ref 26.0–34.0)
MCHC: 34.9 g/dL (ref 30.0–36.0)
MCHC: 33.3 g/dL (ref 30.0–36.0)
MCV: 91.1 fL (ref 80.0–100.0)
MCV: 92.9 fL (ref 80.0–100.0)
Platelets: 285 10*3/uL (ref 150–400)
Platelets: 295 10*3/uL (ref 150–400)
RBC: 3.81 MIL/uL — ABNORMAL LOW (ref 4.22–5.81)
RBC: 3.68 MIL/uL — ABNORMAL LOW (ref 4.22–5.81)
RDW: 13.3 % (ref 11.5–15.5)
RDW: 13.6 % (ref 11.5–15.5)
WBC: 20.9 10*3/uL — ABNORMAL HIGH (ref 4.0–10.5)
WBC: 21.3 10*3/uL — ABNORMAL HIGH (ref 4.0–10.5)
nRBC: 0.1 % (ref 0.0–0.2)
nRBC: 0 % (ref 0.0–0.2)

## 2018-11-12 LAB — HIV ANTIBODY (ROUTINE TESTING W REFLEX): HIV Screen 4th Generation wRfx: NONREACTIVE

## 2018-11-12 LAB — COMPREHENSIVE METABOLIC PANEL
ALT: 572 U/L — ABNORMAL HIGH (ref 0–44)
AST: 2239 U/L — ABNORMAL HIGH (ref 15–41)
Albumin: 2.3 g/dL — ABNORMAL LOW (ref 3.5–5.0)
Alkaline Phosphatase: 93 U/L (ref 38–126)
Anion gap: 14 (ref 5–15)
BUN: 38 mg/dL — ABNORMAL HIGH (ref 8–23)
CO2: 19 mmol/L — ABNORMAL LOW (ref 22–32)
Calcium: 7.5 mg/dL — ABNORMAL LOW (ref 8.9–10.3)
Chloride: 103 mmol/L (ref 98–111)
Creatinine, Ser: 2.25 mg/dL — ABNORMAL HIGH (ref 0.61–1.24)
GFR calc Af Amer: 35 mL/min — ABNORMAL LOW (ref >60)
GFR calc non Af Amer: 30 mL/min — ABNORMAL LOW (ref >60)
Glucose, Bld: 68 mg/dL — ABNORMAL LOW (ref 70–99)
Potassium: 3.9 mmol/L (ref 3.5–5.1)
Sodium: 136 mmol/L (ref 135–145)
Total Bilirubin: 2 mg/dL — ABNORMAL HIGH (ref 0.3–1.2)
Total Protein: 5.6 g/dL — ABNORMAL LOW (ref 6.5–8.1)

## 2018-11-12 LAB — PATHOLOGIST SMEAR REVIEW

## 2018-11-12 LAB — TROPONIN I (HIGH SENSITIVITY)
Troponin I (High Sensitivity): 425 ng/L (ref <18)
Troponin I (High Sensitivity): 439 ng/L (ref <18)

## 2018-11-12 LAB — HEPATIC FUNCTION PANEL
ALT: 572 U/L — ABNORMAL HIGH (ref 0–44)
AST: 2249 U/L — ABNORMAL HIGH (ref 15–41)
Albumin: 2.1 g/dL — ABNORMAL LOW (ref 3.5–5.0)
Alkaline Phosphatase: 87 U/L (ref 38–126)
Bilirubin, Direct: 0.8 mg/dL — ABNORMAL HIGH (ref 0.0–0.2)
Indirect Bilirubin: 0.9 mg/dL (ref 0.3–0.9)
Total Bilirubin: 1.7 mg/dL — ABNORMAL HIGH (ref 0.3–1.2)
Total Protein: 5 g/dL — ABNORMAL LOW (ref 6.5–8.1)

## 2018-11-12 LAB — MAGNESIUM
Magnesium: 1.8 mg/dL (ref 1.7–2.4)
Magnesium: 2.7 mg/dL — ABNORMAL HIGH (ref 1.7–2.4)

## 2018-11-12 LAB — PHOSPHORUS: Phosphorus: 5.4 mg/dL — ABNORMAL HIGH (ref 2.5–4.6)

## 2018-11-12 LAB — HEPATITIS PANEL, ACUTE
HCV Ab: NONREACTIVE
Hep A IgM: NONREACTIVE
Hep B C IgM: NONREACTIVE
Hepatitis B Surface Ag: NONREACTIVE

## 2018-11-12 LAB — ECHOCARDIOGRAM COMPLETE
Height: 69 in
Weight: 2539.7 oz

## 2018-11-12 LAB — PROTIME-INR
INR: 1.9 — ABNORMAL HIGH (ref 0.8–1.2)
Prothrombin Time: 21.7 seconds — ABNORMAL HIGH (ref 11.4–15.2)

## 2018-11-12 LAB — MRSA PCR SCREENING: MRSA by PCR: NEGATIVE

## 2018-11-12 LAB — HEPARIN LEVEL (UNFRACTIONATED): Heparin Unfractionated: <0.1 IU/mL — ABNORMAL LOW (ref 0.30–0.70)

## 2018-11-12 LAB — LACTIC ACID, PLASMA
Lactic Acid, Venous: 2.9 mmol/L (ref 0.5–1.9)
Lactic Acid, Venous: 2.2 mmol/L (ref 0.5–1.9)

## 2018-11-12 LAB — SARS CORONAVIRUS 2 (TAT 6-24 HRS): SARS Coronavirus 2: NEGATIVE

## 2018-11-12 LAB — LIPASE, BLOOD: Lipase: 17 U/L (ref 11–51)

## 2018-11-12 MED ORDER — ADULT MULTIVITAMIN W/MINERALS CH
1.0000 | ORAL_TABLET | Freq: Every day | ORAL | Status: DC
  Administered 2018-11-13 – 2018-11-24 (×12): 1 via ORAL
  Filled 2018-11-12 (×13): qty 1

## 2018-11-12 MED ORDER — MIDAZOLAM HCL 5 MG/5ML IJ SOLN
INTRAMUSCULAR | Status: AC
  Filled 2018-11-12: qty 5

## 2018-11-12 MED ORDER — MAGNESIUM SULFATE IN D5W 1-5 GM/100ML-% IV SOLN
1.0000 g | Freq: Once | INTRAVENOUS | Status: AC
  Administered 2018-11-12: 1 g via INTRAVENOUS
  Filled 2018-11-12: qty 100

## 2018-11-12 MED ORDER — POTASSIUM CHLORIDE 20 MEQ PO PACK
40.0000 meq | PACK | Freq: Once | ORAL | Status: AC
  Administered 2018-11-12: 40 meq via ORAL
  Filled 2018-11-12: qty 2

## 2018-11-12 MED ORDER — VANCOMYCIN HCL 1.25 G IV SOLR: 1250.0000 mg | INTRAVENOUS | Status: DC

## 2018-11-12 MED ORDER — MAGNESIUM SULFATE 2 GM/50ML IV SOLN
2.0000 g | Freq: Once | INTRAVENOUS | Status: AC
  Administered 2018-11-12: 2 g via INTRAVENOUS
  Filled 2018-11-12: qty 50

## 2018-11-12 MED ORDER — VANCOMYCIN VARIABLE DOSE PER UNSTABLE RENAL FUNCTION (PHARMACIST DOSING): Status: DC

## 2018-11-12 MED ORDER — HEPARIN (PORCINE) 25000 UT/250ML-% IV SOLN
1300.0000 [IU]/h | INTRAVENOUS | Status: DC
  Administered 2018-11-12: 900 [IU]/h via INTRAVENOUS
  Administered 2018-11-12: 850 [IU]/h via INTRAVENOUS
  Administered 2018-11-13: 1300 [IU]/h via INTRAVENOUS
  Filled 2018-11-12 (×3): qty 250

## 2018-11-12 MED ORDER — MIDAZOLAM HCL 2 MG/2ML IJ SOLN
INTRAMUSCULAR | Status: AC | PRN
  Administered 2018-11-12 (×2): 1 mg via INTRAVENOUS

## 2018-11-12 MED ORDER — NICOTINE 21 MG/24HR TD PT24
21.0000 mg | MEDICATED_PATCH | Freq: Every day | TRANSDERMAL | Status: DC
  Administered 2018-11-12 – 2018-11-25 (×15): 21 mg via TRANSDERMAL
  Filled 2018-11-12 (×15): qty 1

## 2018-11-12 MED ORDER — PIPERACILLIN-TAZOBACTAM 3.375 G IVPB
3.3750 g | Freq: Three times a day (TID) | INTRAVENOUS | Status: DC
  Administered 2018-11-12 – 2018-11-14 (×7): 3.375 g via INTRAVENOUS
  Filled 2018-11-12 (×7): qty 50

## 2018-11-12 MED ORDER — FENTANYL CITRATE (PF) 100 MCG/2ML IJ SOLN
INTRAMUSCULAR | Status: AC | PRN
  Administered 2018-11-12 (×2): 50 ug via INTRAVENOUS

## 2018-11-12 MED ORDER — DILTIAZEM HCL 25 MG/5ML IV SOLN: 10.0000 mg | Freq: Once | INTRAVENOUS | Status: DC

## 2018-11-12 MED ORDER — FOLIC ACID 1 MG PO TABS
1.0000 mg | ORAL_TABLET | Freq: Every day | ORAL | Status: DC
  Administered 2018-11-13 – 2018-11-24 (×12): 1 mg via ORAL
  Filled 2018-11-12 (×13): qty 1

## 2018-11-12 MED ORDER — ASPIRIN EC 81 MG PO TBEC
81.0000 mg | DELAYED_RELEASE_TABLET | Freq: Every day | ORAL | Status: DC
  Administered 2018-11-12 – 2018-11-25 (×14): 81 mg via ORAL
  Filled 2018-11-12 (×14): qty 1

## 2018-11-12 MED ORDER — LORAZEPAM 2 MG/ML IJ SOLN: 1.0000 mg | INTRAMUSCULAR | Status: AC | PRN

## 2018-11-12 MED ORDER — NITROGLYCERIN 0.4 MG SL SUBL: 0.4000 mg | SUBLINGUAL_TABLET | SUBLINGUAL | Status: DC | PRN

## 2018-11-12 MED ORDER — ATORVASTATIN CALCIUM 20 MG PO TABS: 40.0000 mg | ORAL_TABLET | Freq: Every day | ORAL | Status: DC

## 2018-11-12 MED ORDER — FENTANYL CITRATE (PF) 100 MCG/2ML IJ SOLN
INTRAMUSCULAR | Status: AC
  Filled 2018-11-12: qty 2

## 2018-11-12 MED ORDER — LORAZEPAM 1 MG PO TABS: 1.0000 mg | ORAL_TABLET | ORAL | Status: AC | PRN

## 2018-11-12 MED ORDER — VITAMIN B-1 100 MG PO TABS
100.0000 mg | ORAL_TABLET | Freq: Every day | ORAL | Status: DC
  Administered 2018-11-13 – 2018-11-24 (×11): 100 mg via ORAL
  Filled 2018-11-12 (×13): qty 1

## 2018-11-12 MED ORDER — THIAMINE HCL 100 MG/ML IJ SOLN
100.0000 mg | Freq: Every day | INTRAMUSCULAR | Status: DC
  Administered 2018-11-21: 100 mg via INTRAVENOUS
  Filled 2018-11-12 (×4): qty 2

## 2018-11-12 MED ORDER — LACTATED RINGERS IV BOLUS
500.0000 mL | Freq: Once | INTRAVENOUS | Status: AC
  Administered 2018-11-12: 500 mL via INTRAVENOUS

## 2018-11-12 MED ORDER — MORPHINE SULFATE (PF) 4 MG/ML IV SOLN
4.0000 mg | INTRAVENOUS | Status: DC | PRN
  Administered 2018-11-12 – 2018-11-16 (×15): 4 mg via INTRAVENOUS
  Filled 2018-11-12 (×16): qty 1

## 2018-11-12 MED ORDER — MORPHINE SULFATE (PF) 2 MG/ML IV SOLN: 1.0000 mg | INTRAVENOUS | Status: DC | PRN

## 2018-11-12 NOTE — Procedures (Signed)
Interventional Radiology Procedure:   Indications: Left chest empyema.  Procedure: CT guided left chest tube placement  Findings: 14 French drain placed and cloudy yellow fluid removed.   Complications: None     EBL: less than 20 ml  Plan: Send fluid for culture.  Chest tube management per Thoracic Surgery.     Skyylar Kopf R. Anselm Pancoast, MD  Pager: [phone]

## 2018-11-12 NOTE — Progress Notes (Signed)
*  PRELIMINARY RESULTS* Echocardiogram 2D Echocardiogram has been performed.  Thomas Coffey 11/12/2018, 11:57 AM 

## 2018-11-12 NOTE — Progress Notes (Signed)
Patient ID: Thomas Coffey, male   DOB: 05-Oct-1955, 63 y.o.   MRN: [id number]  Chief Complaint  Patient presents with  . Chest Pain    Referred By Dr. Darvin Neighbours  Reason for Referral complex left pleural effusion  HPI Location, Quality, Duration, Severity, Timing, Context, Modifying Factors, Associated Signs and Symptoms.  Thomas Coffey is a 63 y.o. male.  His problems began several weeks ago when he presented to [facility] with complaints of some right upper quadrant pain midepigastric pain and left chest wall pain.  Occasionally this was associated with some shortness of breath but no fevers or chills.  He states that he was extensively evaluated including x-rays and CT scans but no further intervention was required.  His last visit to [facility] was within the last for 5 days.  He was visiting his son here in [place] and his symptoms persisted so he came to our emergency room where he was found to be in rapid atrial fibrillation.  He also had a CT scan done which showed an extensive left lower lobe pneumonia with what appears to be a complicated left pleural effusion (probable empyema).  He was also noted to have cholelithiasis and extensive elevation of his liver enzymes.  He is admitted at the hospital for evaluation of his pneumonia cholelithiasis hepatitis and abnormal laboratory studies.  The patient states that he is a lifelong smoker.  He smokes about 1/2 pack cigarettes a day.  He has had a previous intestinal surgery although he is not sure what kind.   History reviewed. No pertinent past medical history.    Family History  Problem Relation Age of Onset  . Cancer Mother   . Kidney disease Brother     Social History Social History   Tobacco Use  . Smoking status: Current Every Day Smoker    Packs/day: 0.50    Years: 30.00    Pack years: 15.00    Types: Cigarettes  Substance Use Topics  . Alcohol use: Yes    Comment: socially  . Drug use: Never    No Known  Allergies  Current Facility-Administered Medications  Medication Dose Route Frequency Provider Last Rate Last Dose  . 0.9 % NaCl with KCl 20 mEq/ L  infusion   Intravenous Continuous Henreitta Leber, MD 125 mL/hr at 11/12/18 0109    . acetaminophen (TYLENOL) tablet 650 mg  650 mg Oral Q6H PRN Henreitta Leber, MD       Or  . acetaminophen (TYLENOL) suppository 650 mg  650 mg Rectal Q6H PRN Henreitta Leber, MD      . aspirin EC tablet 81 mg  81 mg Oral Daily Mansy, Jan A, MD      . atorvastatin (LIPITOR) tablet 40 mg  40 mg Oral q1800 Mansy, Jan A, MD      . heparin ADULT infusion 100 units/mL (25000 units/243mL sodium chloride 0.45%)  850 Units/hr Intravenous Continuous Hall, Scott A, RPH      . magnesium sulfate IVPB 2 g 50 mL  2 g Intravenous Once Mansy, Jan A, MD 50 mL/hr at 11/12/18 0639 2 g at 11/12/18 0639  . morphine 2 MG/ML injection 1-2 mg  1-2 mg Intravenous Q2H PRN Mansy, Jan A, MD      . nicotine (NICODERM CQ - dosed in mg/24 hours) patch 21 mg  21 mg Transdermal Daily Hart Robinsons A, RPH   21 mg at 11/12/18 0445  . nitroGLYCERIN (NITROSTAT) SL tablet 0.4 mg  0.4 mg Sublingual Q5 min PRN Mansy, Jan A, MD      . ondansetron (ZOFRAN) tablet 4 mg  4 mg Oral Q6H PRN Houston Siren, MD       Or  . ondansetron (ZOFRAN) injection 4 mg  4 mg Intravenous Q6H PRN Houston Siren, MD      . piperacillin-tazobactam (ZOSYN) IVPB 3.375 g  3.375 g Intravenous Q8H Hall, Scott A, RPH 12.5 mL/hr at 11/12/18 0640 3.375 g at 11/12/18 0640  . [START ON 11/13/2018] vancomycin (VANCOCIN) 1,250 mg in sodium chloride 0.9 % 250 mL IVPB  1,250 mg Intravenous Q36H Hall, Scott A, RPH          Review of Systems A complete review of systems was asked and was negative except for the following positive findings occasional sputum production but no fever he states that he has some left-sided chest pain that is localized to the left lower aspect of his chest.  He holds his hand over his lateral chest  wall.  Blood pressure (!) 93/54, pulse (!) 102, temperature 99 F (37.2 C), temperature source Oral, resp. rate 18, height 5\' 9"  (1.753 m), weight 72 kg, SpO2 90 %.  Physical Exam CONSTITUTIONAL:  Pleasant, well-developed, well-nourished, and in no acute distress. EYES: Pupils equal and reactive to light, Sclera non-icteric EARS, NOSE, MOUTH AND THROAT:  The oropharynx was clear.  Dentition is good repair.  Oral mucosa pink and moist. LYMPH NODES:  Lymph nodes in the neck and axillae were normal RESPIRATORY:  Lungs were clear except at the left base.  Normal respiratory effort without pathologic use of accessory muscles of respiration CARDIOVASCULAR: Heart was regular without murmurs.  There were no carotid bruits. GI: The abdomen was soft, nontender, and nondistended. There were no palpable masses. There was no hepatosplenomegaly. There were normal bowel sounds in all quadrants. GU:  Rectal deferred.   MUSCULOSKELETAL:  Normal muscle strength and tone.  No clubbing or cyanosis.   SKIN:  There were no pathologic skin lesions.  There were no nodules on palpation. NEUROLOGIC:  Sensation is normal.  Cranial nerves are grossly intact. PSYCH:  Oriented to person, place and time.  Mood and affect are normal.  Data Reviewed Chest x-rays and CT scans  I have personally reviewed the patient's imaging, laboratory findings and medical records.    Assessment    Complicated left parapneumonic effusion which may be an empyema    Plan    My recommendation given his extensive comorbid conditions would be to percutaneously drain his left pleural effusion with a large pigtail catheter by interventional radiology.  Once this is complete we can then use intrapleural thrombolytics for management of his empyema.  We will also ask to obtain the x-rays from [facility] obtained earlier this week.  This will be helpful for review of the tempo of his pneumonia.       [facility], MD 11/12/2018, 7:24  AM

## 2018-11-12 NOTE — Progress Notes (Signed)
Patient clinically stable post CT Placement per Dr Anselm Pancoast, tolerated well, denies complaints at this time. Report given to Serenity RN on 2a with questions answered. Pink/tan tinged drainage. Dressing secured and pink tube secured to connection of CT to suction tubing per policy. No visible air leak post procedure. Received Versed 2mg  along with Fentanyl 100 mcg iv for procedure.

## 2018-11-12 NOTE — Progress Notes (Signed)
Pharmacy Antibiotic Note  Thomas Coffey is a 63 y.o. male admitted on 11/11/2018 with pneumonia and IAI.  Pharmacy has been consulted for Zosyn and Vancomycin dosing.  Vanc 1750mg  and Zosyn 3.375gm given in ED.  Plan: Zosyn 3.375gm IV q8h (4 hr infusion)  Vancomycin 1250 mg IV Q 36 hrs. Goal AUC 400-550. Expected AUC: 457.2 SCr used: 2.04  Height: 5\' 9"  (175.3 cm) Weight: 158 lb 11.7 oz (72 kg) IBW/kg (Calculated) : 70.7  Temp (24hrs), Avg:98.6 F (37 C), Min:98.2 F (36.8 C), Max:99 F (37.2 C)  Recent Labs  Lab 11/11/18 1836 11/11/18 1938 11/11/18 2258  WBC 24.7*  --   --   CREATININE 2.04*  --   --   LATICACIDVEN  --  6.7* 5.1*    Estimated Creatinine Clearance: 37.1 mL/min (A) (by C-G formula based on SCr of 2.04 mg/dL (H)).    No Known Allergies  Antimicrobials this admission: Vancomycin 10/26 >>  Zosyn 10/26 >>  Cefepime 10/26 x 1  Dose adjustments this admission:   Microbiology results:  BCx:   UCx:    Sputum:    MRSA PCR:   Thank you for allowing pharmacy to be a part of this patient's care.  Hart Robinsons A 11/12/2018 3:33 AM

## 2018-11-12 NOTE — Progress Notes (Signed)
ANTICOAGULATION CONSULT NOTE - Follow up [place] for Heparin Indication: chest pain/ACS  No Known Allergies  Patient Measurements: Height: 5\' 9"  (175.3 cm) Weight: 158 lb 11.7 oz (72 kg) IBW/kg (Calculated) : 70.7 HEPARIN DW (KG): 72  Vital Signs: BP: 116/72 (10/27 1605) Pulse Rate: 92 (10/27 1605)  Labs: Recent Labs    11/11/18 1836 11/11/18 1938 11/11/18 2345 11/12/18 0501 11/12/18 0816 11/12/18 1354 11/12/18 1635  HGB 16.1  --   --  12.1*  --   --  11.4*  HCT 49.1  --   --  34.7*  --   --  34.2*  PLT 363  --   --  285  --   --  295  APTT  --  39*  --   --   --   --   --   LABPROT  --  26.3*  --   --   --   --  21.7*  INR  --  2.5*  --   --   --   --  1.9*  HEPARINUNFRC  --   --   --   --   --  <0.10*  --   CREATININE 2.04*  --   --  2.25*  --   --   --   TROPONINIHS 41*  --  439*  --  425*  --   --     Estimated Creatinine Clearance: 33.6 mL/min (A) (by C-G formula based on SCr of 2.25 mg/dL (H)).  Assessment: Pharmacy asked to initiate and monitor Heparin for elevated troponin.  Pt not on any anticoagulants PTA per med list.  INR and aPTT is elevated on admission.  Heparin infusion started at 850 units/hr, no bolus due to elevated INR  Goal of Therapy:  Heparin level 0.3-0.7 units/ml Monitor platelets by anticoagulation protocol: Yes   Plan:  HL at 1354 <0.10, heparin drip stopped at 1319 for chest tube placement (delayed entry on MAR). Will restart heparin drip at 900 units/hr.  Will check heparin level 6 hours after infusion restarted CBC in AM  Pharmacy will continue to follow.   Rayna Sexton L 11/12/2018,5:49 PM

## 2018-11-12 NOTE — Consult Note (Signed)
Chief Complaint: Patient was seen in consultation today for chest tube placement.  Referring Physician(s): Dr. Genevive Bi  Patient Status: Thomas Coffey - In-pt  History of Present Illness: Thomas Coffey is a 63 y.o. male with multiple medical problems including probable left chest empyema.  Patient presented yesterday to the hospital with chest pain and shortness of breath.  CT of the chest demonstrates complex left pleural effusion with gas.  Findings are most compatible with an empyema.  Patient was evaluated by thoracic surgery and a image guided chest tube has been requested.  Patient also has abnormal liver enzymes and elevated INR level.  Patient was on heparin for atrial fibrillation but the heparin has been held for the procedure.  Allergies: Patient has no known allergies.  Medications: Prior to Admission medications   Medication Sig Start Date End Date Taking? Authorizing Provider  acetaminophen-codeine (TYLENOL #3) 300-30 MG tablet Take 1 tablet by mouth every 6 (six) hours as needed for pain. 11/08/18 11/13/18 Yes [provider]     Family History  Problem Relation Age of Onset   Cancer Mother    Kidney disease Brother     Social History   Socioeconomic History   Marital status: Married    Spouse name: Not on file   Number of children: Not on file   Years of education: Not on file   Highest education level: Not on file  Occupational History   Not on file  Social Needs   Financial resource strain: Not on file   Food insecurity    Worry: Not on file    Inability: Not on file   Transportation needs    Medical: Not on file    Non-medical: Not on file  Tobacco Use   Smoking status: Current Every Day Smoker    Packs/day: 0.50    Years: 30.00    Pack years: 15.00    Types: Cigarettes  Substance and Sexual Activity   Alcohol use: Yes    Comment: socially   Drug use: Never   Sexual activity: Not on file  Lifestyle   Physical activity   Days per week: Not on file    Minutes per session: Not on file   Stress: Not on file  Relationships   Social connections    Talks on phone: Not on file    Gets together: Not on file    Attends religious service: Not on file    Active member of club or organization: Not on file    Attends meetings of clubs or organizations: Not on file    Relationship status: Not on file  Other Topics Concern   Not on file  Social History Narrative   Not on file      Review of Systems  Respiratory: Positive for shortness of breath.   Cardiovascular: Positive for chest pain.    Vital Signs: BP (!) 93/54 (BP Location: Right Arm)    Pulse 99    Temp 99 F (37.2 C) (Oral)    Resp 18    Ht 5\' 9"  (1.753 m)    Wt 72 kg    SpO2 90%    BMI 23.44 kg/m   Physical Exam Cardiovascular:     Rate and Rhythm: Normal rate and regular rhythm.  Pulmonary:     Breath sounds: Rhonchi present.     Comments: Decreased breath sounds bilaterally. Neurological:     Mental Status: He is alert.     Imaging: Dg Chest 1  View  Result Date: 11/11/2018 CLINICAL DATA:  Chest pain EXAM: CHEST  1 VIEW COMPARISON:  07/15/2005 FINDINGS: Defibrillator pads overlie the left lower chest. Increased left basilar ill-defined opacity obscures the left hemidiaphragm may represent atelectasis and/or consolidation. Difficult to exclude left lower lung pneumonia. Right lung remains clear. Normal heart size and vascularity. No large effusion or pneumothorax. Trachea midline. Aorta atherosclerotic. Degenerative changes of the spine. IMPRESSION: Increased left lower lung ill-defined opacity compatible with atelectasis or developing airspace disease. Electronically Signed   By: Judie Petit.  Shick M.D.   On: 11/11/2018 19:28   Ct Angio Chest Pe W And/or Wo Contrast  Result Date: 11/11/2018 CLINICAL DATA:  Abdominal pain and complex chest pain. EXAM: CT ANGIOGRAPHY CHEST CT ABDOMEN AND PELVIS WITH CONTRAST TECHNIQUE: Multidetector CT imaging of  the chest was performed using the standard protocol during bolus administration of intravenous contrast. Multiplanar CT image reconstructions and MIPs were obtained to evaluate the vascular anatomy. Multidetector CT imaging of the abdomen and pelvis was performed using the standard protocol during bolus administration of intravenous contrast. CONTRAST:  59mL OMNIPAQUE IOHEXOL 350 MG/ML SOLN COMPARISON:  CT dated July 15, 2005 FINDINGS: CTA CHEST FINDINGS Cardiovascular: Contrast injection is sufficient to demonstrate satisfactory opacification of the pulmonary arteries to the segmental level. There is no pulmonary embolus. The main pulmonary artery is within normal limits for size. There is no CT evidence of acute right heart strain. The visualized aorta is normal. Heart size is normal, without pericardial effusion. Mediastinum/Nodes: --there is some mildly prominent mediastinal lymph nodes, likely reactive. --No axillary lymphadenopathy. --No supraclavicular lymphadenopathy. --Normal thyroid gland. --The esophagus is unremarkable Lungs/Pleura: There is a large complex left-sided empyema with multiple pockets of gas scattered throughout the collection both in a dependent and nondependent location. There is consolidation of the left lower lobe with findings raising concern for necrotizing pneumonia. Moderate emphysematous changes are noted bilaterally. There is a trace right-sided pleural effusion there is some mild interlobular septal thickening. There is some debris within the trachea. Musculoskeletal: No chest wall abnormality. No acute or significant osseous findings. Review of the MIP images confirms the above findings. CT ABDOMEN and PELVIS FINDINGS Hepatobiliary: There is decreased hepatic attenuation suggestive of hepatic steatosis. Normal gallbladder.There is no biliary ductal dilation. Pancreas: Normal contours without ductal dilatation. No peripancreatic fluid collection. Spleen: No splenic laceration or  hematoma. Adrenals/Urinary Tract: --Adrenal glands: No adrenal hemorrhage. --Right kidney/ureter: No hydronephrosis or perinephric hematoma. --Left kidney/ureter: No hydronephrosis or perinephric hematoma. --Urinary bladder: Unremarkable. Stomach/Bowel: --Stomach/Duodenum: There is moderate distention of the stomach with an air-fluid level --Small bowel: No dilatation or inflammation. --Colon: The patient is status post right hemicolectomy. --Appendix: Surgically absent. Vascular/Lymphatic: Normal course and caliber of the major abdominal vessels. --No retroperitoneal lymphadenopathy. --No mesenteric lymphadenopathy. --No pelvic or inguinal lymphadenopathy. Reproductive: Unremarkable Other: No ascites or free air. The abdominal wall is normal. Musculoskeletal. No acute displaced fractures. Review of the MIP images confirms the above findings. IMPRESSION: 1. Large complex left-sided empyema. 2. Consolidation of the left lower lobe with findings raising concern for necrotizing pneumonia. 3. Trace right-sided pleural effusion. 4. No acute intra-abdominal or pelvic process. 5. Hepatic steatosis. 6. Moderate distention of the stomach. 7. Status post right hemicolectomy. Aortic Atherosclerosis (ICD10-I70.0) and Emphysema (ICD10-J43.9). Electronically Signed   By: Katherine Mantle M.D.   On: 11/11/2018 21:37   Ct Abdomen Pelvis W Contrast  Result Date: 11/11/2018 CLINICAL DATA:  Abdominal pain and complex chest pain. EXAM: CT ANGIOGRAPHY CHEST CT ABDOMEN  AND PELVIS WITH CONTRAST TECHNIQUE: Multidetector CT imaging of the chest was performed using the standard protocol during bolus administration of intravenous contrast. Multiplanar CT image reconstructions and MIPs were obtained to evaluate the vascular anatomy. Multidetector CT imaging of the abdomen and pelvis was performed using the standard protocol during bolus administration of intravenous contrast. CONTRAST:  60mL OMNIPAQUE IOHEXOL 350 MG/ML SOLN COMPARISON:   CT dated July 15, 2005 FINDINGS: CTA CHEST FINDINGS Cardiovascular: Contrast injection is sufficient to demonstrate satisfactory opacification of the pulmonary arteries to the segmental level. There is no pulmonary embolus. The main pulmonary artery is within normal limits for size. There is no CT evidence of acute right heart strain. The visualized aorta is normal. Heart size is normal, without pericardial effusion. Mediastinum/Nodes: --there is some mildly prominent mediastinal lymph nodes, likely reactive. --No axillary lymphadenopathy. --No supraclavicular lymphadenopathy. --Normal thyroid gland. --The esophagus is unremarkable Lungs/Pleura: There is a large complex left-sided empyema with multiple pockets of gas scattered throughout the collection both in a dependent and nondependent location. There is consolidation of the left lower lobe with findings raising concern for necrotizing pneumonia. Moderate emphysematous changes are noted bilaterally. There is a trace right-sided pleural effusion there is some mild interlobular septal thickening. There is some debris within the trachea. Musculoskeletal: No chest wall abnormality. No acute or significant osseous findings. Review of the MIP images confirms the above findings. CT ABDOMEN and PELVIS FINDINGS Hepatobiliary: There is decreased hepatic attenuation suggestive of hepatic steatosis. Normal gallbladder.There is no biliary ductal dilation. Pancreas: Normal contours without ductal dilatation. No peripancreatic fluid collection. Spleen: No splenic laceration or hematoma. Adrenals/Urinary Tract: --Adrenal glands: No adrenal hemorrhage. --Right kidney/ureter: No hydronephrosis or perinephric hematoma. --Left kidney/ureter: No hydronephrosis or perinephric hematoma. --Urinary bladder: Unremarkable. Stomach/Bowel: --Stomach/Duodenum: There is moderate distention of the stomach with an air-fluid level --Small bowel: No dilatation or inflammation. --Colon: The patient  is status post right hemicolectomy. --Appendix: Surgically absent. Vascular/Lymphatic: Normal course and caliber of the major abdominal vessels. --No retroperitoneal lymphadenopathy. --No mesenteric lymphadenopathy. --No pelvic or inguinal lymphadenopathy. Reproductive: Unremarkable Other: No ascites or free air. The abdominal wall is normal. Musculoskeletal. No acute displaced fractures. Review of the MIP images confirms the above findings. IMPRESSION: 1. Large complex left-sided empyema. 2. Consolidation of the left lower lobe with findings raising concern for necrotizing pneumonia. 3. Trace right-sided pleural effusion. 4. No acute intra-abdominal or pelvic process. 5. Hepatic steatosis. 6. Moderate distention of the stomach. 7. Status post right hemicolectomy. Aortic Atherosclerosis (ICD10-I70.0) and Emphysema (ICD10-J43.9). Electronically Signed   By: Katherine Mantlehristopher  Green M.D.   On: 11/11/2018 21:37   Mr Abdomen Mrcp Wo Contrast  Result Date: 11/12/2018 CLINICAL DATA:  63 year old male with history of abnormal liver function tests. Cholelithiasis. Right upper quadrant abdominal pain. EXAM: MRI ABDOMEN WITHOUT CONTRAST  (INCLUDING MRCP) TECHNIQUE: Multiplanar multisequence MR imaging of the abdomen was performed. Heavily T2-weighted images of the biliary and pancreatic ducts were obtained, and three-dimensional MRCP images were rendered by post processing. COMPARISON:  No prior abdominal MRI. CT the abdomen and pelvis 11/11/2018. FINDINGS: Comment: Study is limited for detection and characterization of visceral and/or vascular lesions by lack of IV gadolinium. Lower chest: Trace right pleural effusion. Complex multilocular left pleural effusion with probable atelectasis in the left lower lobe. Hepatobiliary: Mild diffuse loss of signal intensity throughout the hepatic parenchyma on out of phase dual echo images, indicative of mild hepatic steatosis. No discrete cystic or solid hepatic lesions are confidently  identified on today's  noncontrast examination. Gallbladder is normal in appearance. No intra or extrahepatic biliary ductal dilatation noted on MRCP images. Common bile duct is normal in caliber measuring 5 mm in the porta hepatis. No filling defects in the common bile duct to suggest choledocholithiasis. Pancreas: No definite pancreatic mass identified on today's noncontrast examination. No pancreatic ductal dilatation noted on MRCP images. No pancreatic or peripancreatic fluid collections or inflammatory changes. Spleen:  Unremarkable. Adrenals/Urinary Tract: Unenhanced appearance of the kidneys and bilateral adrenal glands is unremarkable. No hydroureteronephrosis in the visualized portions of the abdomen. Stomach/Bowel: Visualized portions are unremarkable. Vascular/Lymphatic: Aortic atherosclerosis without definite aneurysm in the abdomen on today's noncontrast examination. No lymphadenopathy noted in the abdomen. Other: No significant volume of ascites in the visualized portions of the peritoneal cavity. Trace amount of perinephric fluid bilaterally (nonspecific). Musculoskeletal: No aggressive appearing osseous lesions are noted in the visualized portions of the skeleton. IMPRESSION: 1. No findings to suggest biliary tract obstruction. No choledocholithiasis. 2. Mild hepatic steatosis. 3. Trace right pleural effusion and large complex multilocular left pleural effusion with probable atelectasis in the left lower lobe. 4. Aortic atherosclerosis. Electronically Signed   By: Trudie Reedaniel  Entrikin M.D.   On: 11/12/2018 10:26   Mr 3d Recon At Scanner  Result Date: 11/12/2018 CLINICAL DATA:  63 year old male with history of abnormal liver function tests. Cholelithiasis. Right upper quadrant abdominal pain. EXAM: MRI ABDOMEN WITHOUT CONTRAST  (INCLUDING MRCP) TECHNIQUE: Multiplanar multisequence MR imaging of the abdomen was performed. Heavily T2-weighted images of the biliary and pancreatic ducts were obtained, and  three-dimensional MRCP images were rendered by post processing. COMPARISON:  No prior abdominal MRI. CT the abdomen and pelvis 11/11/2018. FINDINGS: Comment: Study is limited for detection and characterization of visceral and/or vascular lesions by lack of IV gadolinium. Lower chest: Trace right pleural effusion. Complex multilocular left pleural effusion with probable atelectasis in the left lower lobe. Hepatobiliary: Mild diffuse loss of signal intensity throughout the hepatic parenchyma on out of phase dual echo images, indicative of mild hepatic steatosis. No discrete cystic or solid hepatic lesions are confidently identified on today's noncontrast examination. Gallbladder is normal in appearance. No intra or extrahepatic biliary ductal dilatation noted on MRCP images. Common bile duct is normal in caliber measuring 5 mm in the porta hepatis. No filling defects in the common bile duct to suggest choledocholithiasis. Pancreas: No definite pancreatic mass identified on today's noncontrast examination. No pancreatic ductal dilatation noted on MRCP images. No pancreatic or peripancreatic fluid collections or inflammatory changes. Spleen:  Unremarkable. Adrenals/Urinary Tract: Unenhanced appearance of the kidneys and bilateral adrenal glands is unremarkable. No hydroureteronephrosis in the visualized portions of the abdomen. Stomach/Bowel: Visualized portions are unremarkable. Vascular/Lymphatic: Aortic atherosclerosis without definite aneurysm in the abdomen on today's noncontrast examination. No lymphadenopathy noted in the abdomen. Other: No significant volume of ascites in the visualized portions of the peritoneal cavity. Trace amount of perinephric fluid bilaterally (nonspecific). Musculoskeletal: No aggressive appearing osseous lesions are noted in the visualized portions of the skeleton. IMPRESSION: 1. No findings to suggest biliary tract obstruction. No choledocholithiasis. 2. Mild hepatic steatosis. 3. Trace  right pleural effusion and large complex multilocular left pleural effusion with probable atelectasis in the left lower lobe. 4. Aortic atherosclerosis. Electronically Signed   By: Trudie Reedaniel  Entrikin M.D.   On: 11/12/2018 10:26   [place] Abdomen Limited Ruq  Result Date: 11/11/2018 CLINICAL DATA:  Pain EXAM: ULTRASOUND ABDOMEN LIMITED RIGHT UPPER QUADRANT COMPARISON:  None. FINDINGS: Gallbladder: Multiple gallstones are noted. There is  gallbladder wall thickening with the gallbladder wall measuring approximately 4 mm. The sonographic Eulah Pont sign is negative. There is no pericholecystic free fluid. Common bile duct: Diameter: 4 mm Liver: No focal lesion identified. Within normal limits in parenchymal echogenicity. Portal vein is patent on color Doppler imaging with normal direction of blood flow towards the liver. Other: None. IMPRESSION: Cholelithiasis with mild gallbladder wall thickening. In the absence of a positive sonographic Murphy sign, these findings are equivocal for acute cholecystitis. If there is high clinical suspicion for acute cholecystitis, follow-up with HIDA scan is recommended. Electronically Signed   By: Katherine Mantle M.D.   On: 11/11/2018 20:28    Labs:  CBC: Recent Labs    11/11/18 1836 11/12/18 0501  WBC 24.7* 20.9*  HGB 16.1 12.1*  HCT 49.1 34.7*  PLT 363 285    COAGS: Recent Labs    11/11/18 1938  INR 2.5*  APTT 39*    BMP: Recent Labs    11/11/18 1836 11/12/18 0501  NA 138 136  K 2.4* 3.9  CL 95* 103  CO2 17* 19*  GLUCOSE 53* 68*  BUN 27* 38*  CALCIUM 9.2 7.5*  CREATININE 2.04* 2.25*  GFRNONAA 34* 30*  GFRAA 39* 35*    LIVER FUNCTION TESTS: Recent Labs    11/11/18 1836 11/12/18 0501  BILITOT 2.4* 2.0*  AST 1,065* 2,239*  ALT 309* 572*  ALKPHOS 141* 93  PROT 7.7 5.6*  ALBUMIN 2.9* 2.3*    TUMOR MARKERS: No results for input(s): AFPTM, CEA, CA199, CHROMGRNA in the last 8760 hours.  Assessment and Plan:  63 year old with multiple  medical problems including a left chest empyema.  Thoracic surgery has evaluated the patient and recommended an image guided chest tube for intrapleural thrombolytic therapy.  CT-guided left chest tube placement was discussed the patient.   Specifically, we talked about the risk of bleeding due to patient's elevated INR level which is likely related to his current liver disease.   Risks and benefits discussed with the patient including bleeding, infection, damage to adjacent structures, and sepsis.  All of the patient's questions were answered, patient is agreeable to proceed. Consent signed and in chart.  Plan for CT-guided left chest tube placement with moderate sedation.   Thank you for this interesting consult.  I greatly enjoyed meeting Thomas Coffey and look forward to participating in their care.  A copy of this report was sent to the requesting provider on this date.  Electronically Signed: Arn Medal, MD 11/12/2018, 3:08 PM   I spent a total of 20 Minutes    in face to face in clinical consultation, greater than 50% of which was counseling/coordinating care for left chest empyema.

## 2018-11-12 NOTE — Progress Notes (Addendum)
Pharmacy Antibiotic Note  Thomas Coffey is a 63 y.o. male admitted on 11/11/2018 with pneumonia and intra-abdominal infection.  Pharmacy has been consulted for Zosyn and Vancomycin dosing.  Vanc 1750mg  and Zosyn 3.375gm given in ED.  Plan: Zosyn 3.375gm IV q8h (4 hr infusion)  Baseline creatinine unknown. Creatinine is trending up. Will dose vancomycin by levels due to unstable renal function. Pt received vancomycin 1750 @ 10/26 2245. Will order a vancomycin random at 2200. Will re-dose vancomycin according to the level. Recommend to d/c vancomycin if MRSA PCR returned negative.   Height: 5\' 9"  (175.3 cm) Weight: 158 lb 11.7 oz (72 kg) IBW/kg (Calculated) : 70.7  Temp (24hrs), Avg:98.6 F (37 C), Min:98.2 F (36.8 C), Max:99 F (37.2 C)  Recent Labs  Lab 11/11/18 1836 11/11/18 1938 11/11/18 2258 11/12/18 0501  WBC 24.7*  --   --  20.9*  CREATININE 2.04*  --   --  2.25*  LATICACIDVEN  --  6.7* 5.1*  --     Estimated Creatinine Clearance: 33.6 mL/min (A) (by C-G formula based on SCr of 2.25 mg/dL (H)).    No Known Allergies  Antimicrobials this admission: Vancomycin 10/26 >>  Zosyn 10/26 >>  Cefepime 10/26 x 1  Dose adjustments this admission:   Microbiology results: 10/26 BCx: pending:   10/27 MRSA PCR: ordered.   Thank you for allowing pharmacy to be a part of this patient's care.  Oswald Hillock, PharmD, BCPS 11/12/2018 7:55 AM

## 2018-11-12 NOTE — Consult Note (Signed)
Thomas Darby, MD [address]  [place]  [place], [place] [address]  Main: [phone]  Fax: [phone] Pager: [phone]   Consultation  Referring Provider:     No ref. provider found Primary Care Physician:  Patient, [name] Primary Gastroenterologist: Althia Forts        Reason for Consultation:     Elevated LFTs  Date of Admission:  11/11/2018 Date of Consultation:  11/12/2018         HPI:   Thomas Coffey is a 63 y.o. male with no past medical history came to ER yesterday for shortness of breath and pain across his lower chest bilateral as well as back.  He was found to have elevated troponins, CT chest PE protocol revealed large complex left side empyema with consolidation of the left lower lobe concerning for necrotizing pneumonia.  Hepatic steatosis and distended stomach.  He also had a history of right hemicolectomy. In the ER, labs were significant for neutrophilic leukocytosis, elevated lactate 6.7, AKI, significantly elevated transaminases AST 2239, ALT 572, total bilirubin 2, alkaline phosphatase 93.  GI is consulted to evaluate for elevated LFTs He had ultrasound which did not reveal bile duct obstruction, subsequently underwent MRCP which also did not show any biliary obstruction, lipase normal  Review from care everywhere, apparently patient had multiple ER visits to [facility] on several occasions and recently, he has been experiencing pain in his left hip for which he underwent MRI which did not reveal osteomyelitis.  However, patient reports that he has been taking BC powder, over-the-counter NSAIDs including aspirin, Aleve, Advil.  He also reports subjective fevers He reports tobacco use, occasional alcohol use as well  Patient is started on broad-spectrum antibiotics and IV fluids NSAIDs: BC powder, over-the-counter NSAIDs for hip pain  Antiplts/Anticoagulants/Anti thrombotics: None  GI Procedures: Unknown  History reviewed. No pertinent past  medical history.   Prior to Admission medications   Medication Sig Start Date End Date Taking? Authorizing Provider  acetaminophen-codeine (TYLENOL #3) 300-30 MG tablet Take 1 tablet by mouth every 6 (six) hours as needed for pain. 11/08/18 11/13/18 Yes [provider]   Current Facility-Administered Medications:    0.9 % NaCl with KCl 20 mEq/ L  infusion, , Intravenous, Continuous, Sainani, Belia Heman, MD, Last Rate: 125 mL/hr at 11/12/18 0109   aspirin EC tablet 81 mg, 81 mg, Oral, Daily, Mansy, Jan A, MD, 81 mg at 11/12/18 0926   fentaNYL (SUBLIMAZE) 100 MCG/2ML injection, , , ,    heparin ADULT infusion 100 units/mL (25000 units/232m sodium chloride 0.45%), 850 Units/hr, Intravenous, Continuous, Hall, Scott A, RPH, Last Rate: 8.5 mL/hr at 11/12/18 0758, 850 Units/hr at 11/12/18 0758   midazolam (VERSED) 5 MG/5ML injection, , , ,    morphine 4 MG/ML injection 4 mg, 4 mg, Intravenous, Q3H PRN, Mayo, KPete Pelt MD, 4 mg at 11/12/18 1152   nicotine (NICODERM CQ - dosed in mg/24 hours) patch 21 mg, 21 mg, Transdermal, Daily, Hall, Scott A, RPH, 21 mg at 11/12/18 0927   nitroGLYCERIN (NITROSTAT) SL tablet 0.4 mg, 0.4 mg, Sublingual, Q5 min PRN, Mansy, Jan A, MD   ondansetron (ZOFRAN) tablet 4 mg, 4 mg, Oral, Q6H PRN **OR** ondansetron (ZOFRAN) injection 4 mg, 4 mg, Intravenous, Q6H PRN, Sainani, Vivek J, MD   piperacillin-tazobactam (ZOSYN) IVPB 3.375 g, 3.375 g, Intravenous, Q8H, Hall, Scott A, RPH, Last Rate: 12.5 mL/hr at 11/12/18 1423, 3.375 g at 11/12/18 1423   Family History  Problem Relation Age of Onset   Cancer Mother    Kidney disease Brother      Social History   Tobacco Use   Smoking status: Current Every Day Smoker    Packs/day: 0.50    Years: 30.00    Pack years: 15.00    Types: Cigarettes  Substance Use Topics   Alcohol use: Yes    Comment: socially   Drug use: Never    Allergies as of 11/11/2018   (No Known Allergies)    Review of  Systems:    All systems reviewed and negative except where noted in HPI.   Physical Exam:  Vital signs in last 24 hours: Temp:  [98.2 F (36.8 C)-99 F (37.2 C)] 99 F (37.2 C) (10/27 0255) Pulse Rate:  [99-189] 99 (10/27 0752) Resp:  [16-35] 18 (10/27 0255) BP: (84-122)/(49-102) 93/54 (10/27 0255) SpO2:  [90 %-100 %] 90 % (10/27 0255) Weight:  [71.6 kg-79.4 kg] 72 kg (10/27 0255)   General:   Pleasant, cooperative in NAD Head:  Normocephalic and atraumatic. Eyes:   No icterus.   Conjunctiva pink. PERRLA. Ears:  Normal auditory acuity. Neck:  Supple; no masses or thyroidomegaly Lungs: Respirations even and unlabored. Lungs clear to auscultation bilaterally.   No wheezes, crackles, or rhonchi.  Heart:  Regular rate and rhythm;  Without murmur, clicks, rubs or gallops Abdomen:  Soft, nondistended, nontender. Normal bowel sounds. No appreciable masses or hepatomegaly.  No rebound or guarding.  Rectal:  Not performed. Msk:  Symmetrical without gross deformities.  Strength normal Extremities:  Without edema, cyanosis or clubbing. Neurologic:  Alert and oriented x3;  grossly normal neurologically. Skin:  Intact without significant lesions or rashes. Psych:  Alert and cooperative. Normal affect.  LAB RESULTS: CBC Latest Ref Rng & Units 11/12/2018 11/11/2018  WBC 4.0 - 10.5 K/uL 20.9(H) 24.7(H)  Hemoglobin 13.0 - 17.0 g/dL 12.1(L) 16.1  Hematocrit 39.0 - 52.0 % 34.7(L) 49.1  Platelets 150 - 400 K/uL 285 363    BMET BMP Latest Ref Rng & Units 11/12/2018 11/11/2018  Glucose 70 - 99 mg/dL 68(L) 53(L)  BUN 8 - 23 mg/dL 38(H) 27(H)  Creatinine 0.61 - 1.24 mg/dL 2.25(H) 2.04(H)  Sodium 135 - 145 mmol/L 136 138  Potassium 3.5 - 5.1 mmol/L 3.9 2.4(LL)  Chloride 98 - 111 mmol/L 103 95(L)  CO2 22 - 32 mmol/L 19(L) 17(L)  Calcium 8.9 - 10.3 mg/dL 7.5(L) 9.2    LFT Hepatic Function Latest Ref Rng & Units 11/12/2018 11/11/2018  Total Protein 6.5 - 8.1 g/dL 5.6(L) 7.7  Albumin 3.5 -  5.0 g/dL 2.3(L) 2.9(L)  AST 15 - 41 U/L 2,239(H) 1,065(H)  ALT 0 - 44 U/L 572(H) 309(H)  Alk Phosphatase 38 - 126 U/L 93 141(H)  Total Bilirubin 0.3 - 1.2 mg/dL 2.0(H) 2.4(H)     STUDIES: Dg Chest 1 View  Result Date: 11/11/2018 CLINICAL DATA:  Chest pain EXAM: CHEST  1 VIEW COMPARISON:  07/15/2005 FINDINGS: Defibrillator pads overlie the left lower chest. Increased left basilar ill-defined opacity obscures the left hemidiaphragm may represent atelectasis and/or consolidation. Difficult to exclude left lower lung pneumonia. Right lung remains clear. Normal heart size and vascularity. No large effusion or pneumothorax. Trachea midline. Aorta atherosclerotic. Degenerative changes of the spine. IMPRESSION: Increased left lower lung ill-defined opacity compatible with atelectasis or developing airspace disease. Electronically Signed   By: Jerilynn Mages.  Shick M.D.   On: 11/11/2018 19:28   Ct Angio Chest Pe W And/or Wo Contrast  Result Date: 11/11/2018 CLINICAL DATA:  Abdominal pain and complex chest pain. EXAM: CT ANGIOGRAPHY CHEST CT ABDOMEN AND PELVIS WITH CONTRAST TECHNIQUE: Multidetector CT imaging of the chest was performed using the standard protocol during bolus administration of intravenous contrast. Multiplanar CT image reconstructions and MIPs were obtained to evaluate the vascular anatomy. Multidetector CT imaging of the abdomen and pelvis was performed using the standard protocol during bolus administration of intravenous contrast. CONTRAST:  55m OMNIPAQUE IOHEXOL 350 MG/ML SOLN COMPARISON:  CT dated July 15, 2005 FINDINGS: CTA CHEST FINDINGS Cardiovascular: Contrast injection is sufficient to demonstrate satisfactory opacification of the pulmonary arteries to the segmental level. There is no pulmonary embolus. The main pulmonary artery is within normal limits for size. There is no CT evidence of acute right heart strain. The visualized aorta is normal. Heart size is normal, without pericardial  effusion. Mediastinum/Nodes: --there is some mildly prominent mediastinal lymph nodes, likely reactive. --No axillary lymphadenopathy. --No supraclavicular lymphadenopathy. --Normal thyroid gland. --The esophagus is unremarkable Lungs/Pleura: There is a large complex left-sided empyema with multiple pockets of gas scattered throughout the collection both in a dependent and nondependent location. There is consolidation of the left lower lobe with findings raising concern for necrotizing pneumonia. Moderate emphysematous changes are noted bilaterally. There is a trace right-sided pleural effusion there is some mild interlobular septal thickening. There is some debris within the trachea. Musculoskeletal: No chest wall abnormality. No acute or significant osseous findings. Review of the MIP images confirms the above findings. CT ABDOMEN and PELVIS FINDINGS Hepatobiliary: There is decreased hepatic attenuation suggestive of hepatic steatosis. Normal gallbladder.There is no biliary ductal dilation. Pancreas: Normal contours without ductal dilatation. No peripancreatic fluid collection. Spleen: No splenic laceration or hematoma. Adrenals/Urinary Tract: --Adrenal glands: No adrenal hemorrhage. --Right kidney/ureter: No hydronephrosis or perinephric hematoma. --Left kidney/ureter: No hydronephrosis or perinephric hematoma. --Urinary bladder: Unremarkable. Stomach/Bowel: --Stomach/Duodenum: There is moderate distention of the stomach with an air-fluid level --Small bowel: No dilatation or inflammation. --Colon: The patient is status post right hemicolectomy. --Appendix: Surgically absent. Vascular/Lymphatic: Normal course and caliber of the major abdominal vessels. --No retroperitoneal lymphadenopathy. --No mesenteric lymphadenopathy. --No pelvic or inguinal lymphadenopathy. Reproductive: Unremarkable Other: No ascites or free air. The abdominal wall is normal. Musculoskeletal. No acute displaced fractures. Review of the MIP  images confirms the above findings. IMPRESSION: 1. Large complex left-sided empyema. 2. Consolidation of the left lower lobe with findings raising concern for necrotizing pneumonia. 3. Trace right-sided pleural effusion. 4. No acute intra-abdominal or pelvic process. 5. Hepatic steatosis. 6. Moderate distention of the stomach. 7. Status post right hemicolectomy. Aortic Atherosclerosis (ICD10-I70.0) and Emphysema (ICD10-J43.9). Electronically Signed   By: CConstance HolsterM.D.   On: 11/11/2018 21:37   Ct Abdomen Pelvis W Contrast  Result Date: 11/11/2018 CLINICAL DATA:  Abdominal pain and complex chest pain. EXAM: CT ANGIOGRAPHY CHEST CT ABDOMEN AND PELVIS WITH CONTRAST TECHNIQUE: Multidetector CT imaging of the chest was performed using the standard protocol during bolus administration of intravenous contrast. Multiplanar CT image reconstructions and MIPs were obtained to evaluate the vascular anatomy. Multidetector CT imaging of the abdomen and pelvis was performed using the standard protocol during bolus administration of intravenous contrast. CONTRAST:  612mOMNIPAQUE IOHEXOL 350 MG/ML SOLN COMPARISON:  CT dated July 15, 2005 FINDINGS: CTA CHEST FINDINGS Cardiovascular: Contrast injection is sufficient to demonstrate satisfactory opacification of the pulmonary arteries to the segmental level. There is no pulmonary embolus. The main pulmonary artery is within normal limits for size. There is  no CT evidence of acute right heart strain. The visualized aorta is normal. Heart size is normal, without pericardial effusion. Mediastinum/Nodes: --there is some mildly prominent mediastinal lymph nodes, likely reactive. --No axillary lymphadenopathy. --No supraclavicular lymphadenopathy. --Normal thyroid gland. --The esophagus is unremarkable Lungs/Pleura: There is a large complex left-sided empyema with multiple pockets of gas scattered throughout the collection both in a dependent and nondependent location. There is  consolidation of the left lower lobe with findings raising concern for necrotizing pneumonia. Moderate emphysematous changes are noted bilaterally. There is a trace right-sided pleural effusion there is some mild interlobular septal thickening. There is some debris within the trachea. Musculoskeletal: No chest wall abnormality. No acute or significant osseous findings. Review of the MIP images confirms the above findings. CT ABDOMEN and PELVIS FINDINGS Hepatobiliary: There is decreased hepatic attenuation suggestive of hepatic steatosis. Normal gallbladder.There is no biliary ductal dilation. Pancreas: Normal contours without ductal dilatation. No peripancreatic fluid collection. Spleen: No splenic laceration or hematoma. Adrenals/Urinary Tract: --Adrenal glands: No adrenal hemorrhage. --Right kidney/ureter: No hydronephrosis or perinephric hematoma. --Left kidney/ureter: No hydronephrosis or perinephric hematoma. --Urinary bladder: Unremarkable. Stomach/Bowel: --Stomach/Duodenum: There is moderate distention of the stomach with an air-fluid level --Small bowel: No dilatation or inflammation. --Colon: The patient is status post right hemicolectomy. --Appendix: Surgically absent. Vascular/Lymphatic: Normal course and caliber of the major abdominal vessels. --No retroperitoneal lymphadenopathy. --No mesenteric lymphadenopathy. --No pelvic or inguinal lymphadenopathy. Reproductive: Unremarkable Other: No ascites or free air. The abdominal wall is normal. Musculoskeletal. No acute displaced fractures. Review of the MIP images confirms the above findings. IMPRESSION: 1. Large complex left-sided empyema. 2. Consolidation of the left lower lobe with findings raising concern for necrotizing pneumonia. 3. Trace right-sided pleural effusion. 4. No acute intra-abdominal or pelvic process. 5. Hepatic steatosis. 6. Moderate distention of the stomach. 7. Status post right hemicolectomy. Aortic Atherosclerosis (ICD10-I70.0) and  Emphysema (ICD10-J43.9). Electronically Signed   By: Constance Holster M.D.   On: 11/11/2018 21:37   Mr Abdomen Mrcp Wo Contrast  Result Date: 11/12/2018 CLINICAL DATA:  63 year old male with history of abnormal liver function tests. Cholelithiasis. Right upper quadrant abdominal pain. EXAM: MRI ABDOMEN WITHOUT CONTRAST  (INCLUDING MRCP) TECHNIQUE: Multiplanar multisequence MR imaging of the abdomen was performed. Heavily T2-weighted images of the biliary and pancreatic ducts were obtained, and three-dimensional MRCP images were rendered by post processing. COMPARISON:  No prior abdominal MRI. CT the abdomen and pelvis 11/11/2018. FINDINGS: Comment: Study is limited for detection and characterization of visceral and/or vascular lesions by lack of IV gadolinium. Lower chest: Trace right pleural effusion. Complex multilocular left pleural effusion with probable atelectasis in the left lower lobe. Hepatobiliary: Mild diffuse loss of signal intensity throughout the hepatic parenchyma on out of phase dual echo images, indicative of mild hepatic steatosis. No discrete cystic or solid hepatic lesions are confidently identified on today's noncontrast examination. Gallbladder is normal in appearance. No intra or extrahepatic biliary ductal dilatation noted on MRCP images. Common bile duct is normal in caliber measuring 5 mm in the porta hepatis. No filling defects in the common bile duct to suggest choledocholithiasis. Pancreas: No definite pancreatic mass identified on today's noncontrast examination. No pancreatic ductal dilatation noted on MRCP images. No pancreatic or peripancreatic fluid collections or inflammatory changes. Spleen:  Unremarkable. Adrenals/Urinary Tract: Unenhanced appearance of the kidneys and bilateral adrenal glands is unremarkable. No hydroureteronephrosis in the visualized portions of the abdomen. Stomach/Bowel: Visualized portions are unremarkable. Vascular/Lymphatic: Aortic atherosclerosis  without definite aneurysm in the abdomen  on today's noncontrast examination. No lymphadenopathy noted in the abdomen. Other: No significant volume of ascites in the visualized portions of the peritoneal cavity. Trace amount of perinephric fluid bilaterally (nonspecific). Musculoskeletal: No aggressive appearing osseous lesions are noted in the visualized portions of the skeleton. IMPRESSION: 1. No findings to suggest biliary tract obstruction. No choledocholithiasis. 2. Mild hepatic steatosis. 3. Trace right pleural effusion and large complex multilocular left pleural effusion with probable atelectasis in the left lower lobe. 4. Aortic atherosclerosis. Electronically Signed   By: Vinnie Langton M.D.   On: 11/12/2018 10:26   Mr 3d Recon At Scanner  Result Date: 11/12/2018 CLINICAL DATA:  63 year old male with history of abnormal liver function tests. Cholelithiasis. Right upper quadrant abdominal pain. EXAM: MRI ABDOMEN WITHOUT CONTRAST  (INCLUDING MRCP) TECHNIQUE: Multiplanar multisequence MR imaging of the abdomen was performed. Heavily T2-weighted images of the biliary and pancreatic ducts were obtained, and three-dimensional MRCP images were rendered by post processing. COMPARISON:  No prior abdominal MRI. CT the abdomen and pelvis 11/11/2018. FINDINGS: Comment: Study is limited for detection and characterization of visceral and/or vascular lesions by lack of IV gadolinium. Lower chest: Trace right pleural effusion. Complex multilocular left pleural effusion with probable atelectasis in the left lower lobe. Hepatobiliary: Mild diffuse loss of signal intensity throughout the hepatic parenchyma on out of phase dual echo images, indicative of mild hepatic steatosis. No discrete cystic or solid hepatic lesions are confidently identified on today's noncontrast examination. Gallbladder is normal in appearance. No intra or extrahepatic biliary ductal dilatation noted on MRCP images. Common bile duct is normal in  caliber measuring 5 mm in the porta hepatis. No filling defects in the common bile duct to suggest choledocholithiasis. Pancreas: No definite pancreatic mass identified on today's noncontrast examination. No pancreatic ductal dilatation noted on MRCP images. No pancreatic or peripancreatic fluid collections or inflammatory changes. Spleen:  Unremarkable. Adrenals/Urinary Tract: Unenhanced appearance of the kidneys and bilateral adrenal glands is unremarkable. No hydroureteronephrosis in the visualized portions of the abdomen. Stomach/Bowel: Visualized portions are unremarkable. Vascular/Lymphatic: Aortic atherosclerosis without definite aneurysm in the abdomen on today's noncontrast examination. No lymphadenopathy noted in the abdomen. Other: No significant volume of ascites in the visualized portions of the peritoneal cavity. Trace amount of perinephric fluid bilaterally (nonspecific). Musculoskeletal: No aggressive appearing osseous lesions are noted in the visualized portions of the skeleton. IMPRESSION: 1. No findings to suggest biliary tract obstruction. No choledocholithiasis. 2. Mild hepatic steatosis. 3. Trace right pleural effusion and large complex multilocular left pleural effusion with probable atelectasis in the left lower lobe. 4. Aortic atherosclerosis. Electronically Signed   By: Vinnie Langton M.D.   On: 11/12/2018 10:26   US Abdomen Limited Ruq  Result Date: 11/11/2018 CLINICAL DATA:  Pain EXAM: ULTRASOUND ABDOMEN LIMITED RIGHT UPPER QUADRANT COMPARISON:  None. FINDINGS: Gallbladder: Multiple gallstones are noted. There is gallbladder wall thickening with the gallbladder wall measuring approximately 4 mm. The sonographic Percell Miller sign is negative. There is no pericholecystic free fluid. Common bile duct: Diameter: 4 mm Liver: No focal lesion identified. Within normal limits in parenchymal echogenicity. Portal vein is patent on color Doppler imaging with normal direction of blood flow towards  the liver. Other: None. IMPRESSION: Cholelithiasis with mild gallbladder wall thickening. In the absence of a positive sonographic Murphy sign, these findings are equivocal for acute cholecystitis. If there is high clinical suspicion for acute cholecystitis, follow-up with HIDA scan is recommended. Electronically Signed   By: Constance Holster M.D.   On:  11/11/2018 20:28      Impression / Plan:   ZETH BUDAY is a 63 y.o. male with history of alcohol and tobacco use, admitted with left-sided empyema with necrotizing pneumonia and GI is consulted for elevated LFTs  Acute hepatitis Elevated LFTs, elevated transaminases suggest hepatocellular pattern Most likely in the setting of sepsis,and No evidence of biliary obstruction or gallstone pancreatitis, 2D echo revealed EF of 60 to 65% Follow-up on acute viral hepatitis panel Agree with urine toxicology screen to evaluate for cocaine use If above work-up negative, will evaluate for EBV, CMV, HSV infection Monitor LFTs daily Continue maintenance IV fluids  Coagulopathy secondary to DIC in setting of sepsis Closely monitor for symptoms of bleeding FFP as needed  Empyema Patient is undergoing CT-guided drainage with chest tube placement On broad-spectrum antibiotics Cultures pending   Thank you for involving me in the care of this patient.  We will follow along with you    LOS: 1 day   Sherri Sear, MD  11/12/2018, 3:25 PM   Note: This dictation was prepared with Dragon dictation along with smaller phrase technology. Any transcriptional errors that result from this process are unintentional.

## 2018-11-12 NOTE — Consult Note (Signed)
[facility] SURGICAL CONSULTATION NOTE (initial) - cpt: 19509 (Outpatient/ED)   HISTORY OF PRESENT ILLNESS (HPI):  63 y.o. male presented to [facility] ED yesterday (10/26) for evaluation of chest pain. Patient reports that he was working in his yard yesterday afternoon when he noticed that he was SOB. He trie to rest but found himself SOB with even ambulating a short distance. He also endorsed a left sided and central chest tightness. Resting provided mild relief. However this was concerning so he presented to the ED. He notes 1 episode of nausea and emesis and diaphoresis. He denied any cough, congestion, abdominal pain, or bowel changes. No itching or yellowing of his skin or eyes. He denied any history of similar presentation in the past. In the ED he was found to have leukocytosis to 24K, electrolyte derangement, AKI, lactic acidosis (6.7), and elevated LFTs ( AST 2239, ALT 572, total bilirubin 2, alkaline phosphatase 93). RUQ US showed cholelithiasis with CBD dilation. CT did reveal a significant left empyema.   Surgery is consulted by emergency medicine physician Dr. Artis Delay, MD in this context for evaluation and management of cholelithiasis in setting of elevated LFTs.   PAST MEDICAL HISTORY (PMH):  History reviewed. No pertinent past medical history.     MEDICATIONS:  Prior to Admission medications   Medication Sig Start Date End Date Taking? Authorizing Provider  acetaminophen-codeine (TYLENOL #3) 300-30 MG tablet Take 1 tablet by mouth every 6 (six) hours as needed for pain. 11/08/18 11/13/18 Yes [provider]     ALLERGIES:  No Known Allergies   SOCIAL HISTORY:  Social History   Socioeconomic History  . Marital status: Married    Spouse name: Not on file  . Number of children: Not on file  . Years of education: Not on file  . Highest education level: Not on file  Occupational History  . Not on file  Social Needs  . Financial resource strain:  Not on file  . Food insecurity    Worry: Not on file    Inability: Not on file  . Transportation needs    Medical: Not on file    Non-medical: Not on file  Tobacco Use  . Smoking status: Current Every Day Smoker    Packs/day: 0.50    Years: 30.00    Pack years: 15.00    Types: Cigarettes  Substance and Sexual Activity  . Alcohol use: Yes    Comment: socially  . Drug use: Never  . Sexual activity: Not on file  Lifestyle  . Physical activity    Days per week: Not on file    Minutes per session: Not on file  . Stress: Not on file  Relationships  . Social Musician on phone: Not on file    Gets together: Not on file    Attends religious service: Not on file    Active member of club or organization: Not on file    Attends meetings of clubs or organizations: Not on file    Relationship status: Not on file  . Intimate partner violence    Fear of current or ex partner: Not on file    Emotionally abused: Not on file    Physically abused: Not on file    Forced sexual activity: Not on file  Other Topics Concern  . Not on file  Social History Narrative  . Not on file     FAMILY HISTORY:  Family History  Problem Relation Age  of Onset  . Cancer Mother   . Kidney disease Brother       REVIEW OF SYSTEMS:  Review of Systems  Constitutional: Positive for diaphoresis. Negative for chills and fever.  HENT: Negative for congestion and sore throat.   Respiratory: Positive for shortness of breath. Negative for cough.   Cardiovascular: Positive for chest pain. Negative for palpitations.  Gastrointestinal: Positive for nausea and vomiting. Negative for abdominal pain, constipation and diarrhea.  All other systems reviewed and are negative.   VITAL SIGNS:  Temp:  [98.2 F (36.8 C)-99 F (37.2 C)] 99 F (37.2 C) (10/27 0255) Pulse Rate:  [88-189] 95 (10/27 1544) Resp:  [13-35] 26 (10/27 1544) BP: (84-122)/(49-102) 99/64 (10/27 1544) SpO2:  [90 %-100 %] 99 % (10/27  1544) Weight:  [71.6 kg-79.4 kg] 72 kg (10/27 0255)     Height: 5\' 9"  (175.3 cm) Weight: 72 kg BMI (Calculated): 23.43   INTAKE/OUTPUT:  10/26 0701 - 10/27 0700 In: 2200 [IV Piggyback:2200] Out: 0   PHYSICAL EXAM:  Physical Exam Vitals signs and nursing note reviewed.  Constitutional:      General: He is not in acute distress.    Appearance: He is well-developed and normal weight. He is not ill-appearing.  HENT:     Head: Normocephalic and atraumatic.  Eyes:     General: No scleral icterus.    Extraocular Movements: Extraocular movements intact.     Pupils: Pupils are equal, round, and reactive to light.  Cardiovascular:     Rate and Rhythm: Normal rate and regular rhythm.     Pulses: Normal pulses.     Heart sounds: Normal heart sounds. No murmur. No friction rub. No gallop.   Pulmonary:     Effort: Pulmonary effort is normal. No respiratory distress.     Breath sounds: Examination of the left-lower field reveals rhonchi. Decreased breath sounds and rhonchi present. No wheezing.  Abdominal:     General: Abdomen is flat. A surgical scar is present. There is no distension.     Palpations: Abdomen is soft.     Tenderness: There is no abdominal tenderness. There is no guarding or rebound. Negative signs include Murphy's sign.  Genitourinary:    Comments: deferred Musculoskeletal:     Right lower leg: No edema.     Left lower leg: No edema.  Skin:    General: Skin is warm and dry.     Coloration: Skin is not jaundiced.     Findings: No erythema.  Neurological:     General: No focal deficit present.     Mental Status: He is alert. He is disoriented.  Psychiatric:        Mood and Affect: Mood normal.        Behavior: Behavior normal.      Labs:  CBC Latest Ref Rng & Units 11/12/2018 11/11/2018  WBC 4.0 - 10.5 K/uL 20.9(H) 24.7(H)  Hemoglobin 13.0 - 17.0 g/dL 12.1(L) 16.1  Hematocrit 39.0 - 52.0 % 34.7(L) 49.1  Platelets 150 - 400 K/uL 285 363   CMP Latest Ref Rng &  Units 11/12/2018 11/11/2018  Glucose 70 - 99 mg/dL 11/13/2018) 60(V)  BUN 8 - 23 mg/dL 37(T) 06(Y)  Creatinine 0.61 - 1.24 mg/dL 69(S) 8.54(O)  Sodium 135 - 145 mmol/L 136 138  Potassium 3.5 - 5.1 mmol/L 3.9 2.4(LL)  Chloride 98 - 111 mmol/L 103 95(L)  CO2 22 - 32 mmol/L 19(L) 17(L)  Calcium 8.9 - 10.3 mg/dL 7.5(L) 9.2  Total  Protein 6.5 - 8.1 g/dL 5.6(L) 7.7  Total Bilirubin 0.3 - 1.2 mg/dL 2.0(H) 2.4(H)  Alkaline Phos 38 - 126 U/L 93 141(H)  AST 15 - 41 U/L 2,239(H) 1,065(H)  ALT 0 - 44 U/L 572(H) 309(H)     Imaging studies:   RUQ [place] (11/01/2018) personally reviewed showing cholelithiasis and radiologist report reviewed below:  IMPRESSION: Cholelithiasis with mild gallbladder wall thickening. In the absence of a positive sonographic Murphy sign, these findings are equivocal for acute cholecystitis. If there is high clinical suspicion for acute cholecystitis, follow-up with HIDA scan is recommended.  CT Chest/Abdomen/Pelvis (11/11/2018) personally reviewed most concerning for left complex empyema, and radiologist report reviewed:  IMPRESSION: 1. Large complex left-sided empyema. 2. Consolidation of the left lower lobe with findings raising concern for necrotizing pneumonia. 3. Trace right-sided pleural effusion. 4. No acute intra-abdominal or pelvic process. 5. Hepatic steatosis. 6. Moderate distention of the stomach. 7. Status post right hemicolectomy.  MRCP (11/12/2018) personally reviewed without evidence of choledocholithiasis, and radiologist report reviewed:  IMPRESSION: 1. No findings to suggest biliary tract obstruction. No choledocholithiasis. 2. Mild hepatic steatosis. 3. Trace right pleural effusion and large complex multilocular left pleural effusion with probable atelectasis in the left lower lobe. 4. Aortic atherosclerosis.   Assessment/Plan: (ICD-10's: K25.20) 63 y.o. male with cholelithiasis on [place] however he denies any abdominal pain currently and his lab  work is more consistent with hepatic process especially given the significant transaminitis. Do not suspect any gallbladder etiology at this time.    - NPO for IR procedure  - IV ABx for empyema  - No indication for surgical intervention regarding the gallbladder, do not suspect this is the source of his sepsis.    - Agree with GI consultation, appreciate input   - further management per primary service    - No general surgery issues identified, we will sign off.   All of the above findings and recommendations were discussed with the patient and the medical team, and all of patient's questions were answered to his expressed satisfaction.  Thank you for the opportunity to participate in this patient's care.   -- Edison Simon, PA-C [facility] 11/12/2018, 3:50 PM [phone] M-F: 7am - 4pm\

## 2018-11-12 NOTE — Progress Notes (Signed)
ANTICOAGULATION CONSULT NOTE - Initial Consult  Pharmacy Consult for Heparin Indication: chest pain/ACS  No Known Allergies  Patient Measurements: Height: 5\' 9"  (175.3 cm) Weight: 158 lb 11.7 oz (72 kg) IBW/kg (Calculated) : 70.7 HEPARIN DW (KG): 72  Vital Signs: Temp: 99 F (37.2 C) (10/27 0255) Temp Source: Oral (10/27 0255) BP: 93/54 (10/27 0255) Pulse Rate: 102 (10/27 0255)  Labs: Recent Labs    11/11/18 1836 11/11/18 1938 11/11/18 2345 11/12/18 0501  HGB 16.1  --   --  12.1*  HCT 49.1  --   --  34.7*  PLT 363  --   --  285  APTT  --  39*  --   --   LABPROT  --  26.3*  --   --   INR  --  2.5*  --   --   CREATININE 2.04*  --   --   --   TROPONINIHS 41*  --  439*  --     Estimated Creatinine Clearance: 37.1 mL/min (A) (by C-G formula based on SCr of 2.04 mg/dL (H)).   Medical History: History reviewed. No pertinent past medical history.  Medications:  Medications Prior to Admission  Medication Sig Dispense Refill Last Dose  . acetaminophen-codeine (TYLENOL #3) 300-30 MG tablet Take 1 tablet by mouth every 6 (six) hours as needed for pain.   11/11/2018 at 1000    Assessment: Pharmacy asked to initiate and monitor Heparin for elevated troponin.  Pt not on any anticoagulants PTA per med list.  INR and aPTT is elevated on admission.  Goal of Therapy:  Heparin level 0.3-0.7 units/ml Monitor platelets by anticoagulation protocol: Yes   Plan:  Heparin infusion started at 850 units/hr, no bolus due to elevated INR Will check heparin level 6 hours after infusion started  Hart Robinsons A 11/12/2018,6:32 AM

## 2018-11-12 NOTE — Progress Notes (Signed)
[facility] - [place] [facility]   PATIENT NAME: Thomas BeersClinton Coffey    MR#:  [id number]  DATE OF BIRTH:  [birth year]  SUBJECTIVE:   Patient states he is feeling okay today.  He endorses left chest wall pain that radiates down his left flank.  He endorses dry cough.  The left chest wall pain is worse with coughing.  He endorses shortness of breath.  He denies any right upper quadrant pain, epigastric pain, nausea, or vomiting.  REVIEW OF SYSTEMS:  Review of Systems  Constitutional: Negative for chills and fever.  HENT: Negative for congestion and sore throat.   Eyes: Negative for blurred vision and double vision.  Respiratory: Positive for cough and shortness of breath.   Cardiovascular: Positive for chest pain. Negative for palpitations and leg swelling.  Gastrointestinal: Negative for abdominal pain, nausea and vomiting.  Genitourinary: Negative for dysuria and urgency.  Musculoskeletal: Negative for back pain and neck pain.  Neurological: Negative for dizziness and headaches.  Psychiatric/Behavioral: Negative for depression. The patient is not nervous/anxious.     DRUG ALLERGIES:  No Known Allergies VITALS:  Blood pressure (!) 93/54, pulse 99, temperature 99 F (37.2 C), temperature source Oral, resp. rate 18, height 5\' 9"  (1.753 m), weight 72 kg, SpO2 90 %. PHYSICAL EXAMINATION:  Physical Exam  GENERAL:  Laying in the bed with no acute distress.  Thin appearing. HEENT: Head atraumatic, normocephalic. Pupils equal, round, reactive to light and accommodation. No scleral icterus. Extraocular muscles intact. Oropharynx and nasopharynx clear.  NECK:  Supple, no jugular venous distention. No thyroid enlargement. LUNGS: Lungs are clear to auscultation bilaterally. No wheezes, crackles, rhonchi. No use of accessory muscles of respiration.  CARDIOVASCULAR: Mildly tachycardic, regular rhythm, S1, S2 normal. No murmurs, rubs, or gallops.  ABDOMEN: Soft, nontender,  nondistended. Bowel sounds present.  Negative Murphy sign. EXTREMITIES: No pedal edema, cyanosis, or clubbing.  NEUROLOGIC: CN 2-12 intact, no focal deficits. 5/5 muscle strength throughout all extremities. Sensation intact throughout. Gait not checked.  PSYCHIATRIC: The patient is alert and oriented x 3.  SKIN: No obvious rash, lesion, or ulcer.  LABORATORY PANEL:  Male CBC Recent Labs  Lab 11/12/18 0501  WBC 20.9*  HGB 12.1*  HCT 34.7*  PLT 285   ------------------------------------------------------------------------------------------------------------------ Chemistries  Recent Labs  Lab 11/12/18 0501  NA 136  K 3.9  CL 103  CO2 19*  GLUCOSE 68*  BUN 38*  CREATININE 2.25*  CALCIUM 7.5*  MG 1.8  AST 2,239*  ALT 572*  ALKPHOS 93  BILITOT 2.0*   RADIOLOGY:  Dg Chest 1 View  Result Date: 11/11/2018 CLINICAL DATA:  Chest pain EXAM: CHEST  1 VIEW COMPARISON:  07/15/2005 FINDINGS: Defibrillator pads overlie the left lower chest. Increased left basilar ill-defined opacity obscures the left hemidiaphragm may represent atelectasis and/or consolidation. Difficult to exclude left lower lung pneumonia. Right lung remains clear. Normal heart size and vascularity. No large effusion or pneumothorax. Trachea midline. Aorta atherosclerotic. Degenerative changes of the spine. IMPRESSION: Increased left lower lung ill-defined opacity compatible with atelectasis or developing airspace disease. Electronically Signed   By: Judie PetitM.  Shick M.D.   On: 11/11/2018 19:28   Ct Angio Chest Pe W And/or Wo Contrast  Result Date: 11/11/2018 CLINICAL DATA:  Abdominal pain and complex chest pain. EXAM: CT ANGIOGRAPHY CHEST CT ABDOMEN AND PELVIS WITH CONTRAST TECHNIQUE: Multidetector CT imaging of the chest was performed using the standard protocol during bolus administration of intravenous contrast. Multiplanar CT image reconstructions and  MIPs were obtained to evaluate the vascular anatomy. Multidetector CT  imaging of the abdomen and pelvis was performed using the standard protocol during bolus administration of intravenous contrast. CONTRAST:  60mL OMNIPAQUE IOHEXOL 350 MG/ML SOLN COMPARISON:  CT dated July 15, 2005 FINDINGS: CTA CHEST FINDINGS Cardiovascular: Contrast injection is sufficient to demonstrate satisfactory opacification of the pulmonary arteries to the segmental level. There is no pulmonary embolus. The main pulmonary artery is within normal limits for size. There is no CT evidence of acute right heart strain. The visualized aorta is normal. Heart size is normal, without pericardial effusion. Mediastinum/Nodes: --there is some mildly prominent mediastinal lymph nodes, likely reactive. --No axillary lymphadenopathy. --No supraclavicular lymphadenopathy. --Normal thyroid gland. --The esophagus is unremarkable Lungs/Pleura: There is a large complex left-sided empyema with multiple pockets of gas scattered throughout the collection both in a dependent and nondependent location. There is consolidation of the left lower lobe with findings raising concern for necrotizing pneumonia. Moderate emphysematous changes are noted bilaterally. There is a trace right-sided pleural effusion there is some mild interlobular septal thickening. There is some debris within the trachea. Musculoskeletal: No chest wall abnormality. No acute or significant osseous findings. Review of the MIP images confirms the above findings. CT ABDOMEN and PELVIS FINDINGS Hepatobiliary: There is decreased hepatic attenuation suggestive of hepatic steatosis. Normal gallbladder.There is no biliary ductal dilation. Pancreas: Normal contours without ductal dilatation. No peripancreatic fluid collection. Spleen: No splenic laceration or hematoma. Adrenals/Urinary Tract: --Adrenal glands: No adrenal hemorrhage. --Right kidney/ureter: No hydronephrosis or perinephric hematoma. --Left kidney/ureter: No hydronephrosis or perinephric hematoma. --Urinary  bladder: Unremarkable. Stomach/Bowel: --Stomach/Duodenum: There is moderate distention of the stomach with an air-fluid level --Small bowel: No dilatation or inflammation. --Colon: The patient is status post right hemicolectomy. --Appendix: Surgically absent. Vascular/Lymphatic: Normal course and caliber of the major abdominal vessels. --No retroperitoneal lymphadenopathy. --No mesenteric lymphadenopathy. --No pelvic or inguinal lymphadenopathy. Reproductive: Unremarkable Other: No ascites or free air. The abdominal wall is normal. Musculoskeletal. No acute displaced fractures. Review of the MIP images confirms the above findings. IMPRESSION: 1. Large complex left-sided empyema. 2. Consolidation of the left lower lobe with findings raising concern for necrotizing pneumonia. 3. Trace right-sided pleural effusion. 4. No acute intra-abdominal or pelvic process. 5. Hepatic steatosis. 6. Moderate distention of the stomach. 7. Status post right hemicolectomy. Aortic Atherosclerosis (ICD10-I70.0) and Emphysema (ICD10-J43.9). Electronically Signed   By: Katherine Mantlehristopher  Green M.D.   On: 11/11/2018 21:37   Ct Abdomen Pelvis W Contrast  Result Date: 11/11/2018 CLINICAL DATA:  Abdominal pain and complex chest pain. EXAM: CT ANGIOGRAPHY CHEST CT ABDOMEN AND PELVIS WITH CONTRAST TECHNIQUE: Multidetector CT imaging of the chest was performed using the standard protocol during bolus administration of intravenous contrast. Multiplanar CT image reconstructions and MIPs were obtained to evaluate the vascular anatomy. Multidetector CT imaging of the abdomen and pelvis was performed using the standard protocol during bolus administration of intravenous contrast. CONTRAST:  60mL OMNIPAQUE IOHEXOL 350 MG/ML SOLN COMPARISON:  CT dated July 15, 2005 FINDINGS: CTA CHEST FINDINGS Cardiovascular: Contrast injection is sufficient to demonstrate satisfactory opacification of the pulmonary arteries to the segmental level. There is no pulmonary  embolus. The main pulmonary artery is within normal limits for size. There is no CT evidence of acute right heart strain. The visualized aorta is normal. Heart size is normal, without pericardial effusion. Mediastinum/Nodes: --there is some mildly prominent mediastinal lymph nodes, likely reactive. --No axillary lymphadenopathy. --No supraclavicular lymphadenopathy. --Normal thyroid gland. --The esophagus is unremarkable Lungs/Pleura:  There is a large complex left-sided empyema with multiple pockets of gas scattered throughout the collection both in a dependent and nondependent location. There is consolidation of the left lower lobe with findings raising concern for necrotizing pneumonia. Moderate emphysematous changes are noted bilaterally. There is a trace right-sided pleural effusion there is some mild interlobular septal thickening. There is some debris within the trachea. Musculoskeletal: No chest wall abnormality. No acute or significant osseous findings. Review of the MIP images confirms the above findings. CT ABDOMEN and PELVIS FINDINGS Hepatobiliary: There is decreased hepatic attenuation suggestive of hepatic steatosis. Normal gallbladder.There is no biliary ductal dilation. Pancreas: Normal contours without ductal dilatation. No peripancreatic fluid collection. Spleen: No splenic laceration or hematoma. Adrenals/Urinary Tract: --Adrenal glands: No adrenal hemorrhage. --Right kidney/ureter: No hydronephrosis or perinephric hematoma. --Left kidney/ureter: No hydronephrosis or perinephric hematoma. --Urinary bladder: Unremarkable. Stomach/Bowel: --Stomach/Duodenum: There is moderate distention of the stomach with an air-fluid level --Small bowel: No dilatation or inflammation. --Colon: The patient is status post right hemicolectomy. --Appendix: Surgically absent. Vascular/Lymphatic: Normal course and caliber of the major abdominal vessels. --No retroperitoneal lymphadenopathy. --No mesenteric  lymphadenopathy. --No pelvic or inguinal lymphadenopathy. Reproductive: Unremarkable Other: No ascites or free air. The abdominal wall is normal. Musculoskeletal. No acute displaced fractures. Review of the MIP images confirms the above findings. IMPRESSION: 1. Large complex left-sided empyema. 2. Consolidation of the left lower lobe with findings raising concern for necrotizing pneumonia. 3. Trace right-sided pleural effusion. 4. No acute intra-abdominal or pelvic process. 5. Hepatic steatosis. 6. Moderate distention of the stomach. 7. Status post right hemicolectomy. Aortic Atherosclerosis (ICD10-I70.0) and Emphysema (ICD10-J43.9). Electronically Signed   By: Katherine Mantle M.D.   On: 11/11/2018 21:37   Mr Abdomen Mrcp Wo Contrast  Result Date: 11/12/2018 CLINICAL DATA:  63 year old male with history of abnormal liver function tests. Cholelithiasis. Right upper quadrant abdominal pain. EXAM: MRI ABDOMEN WITHOUT CONTRAST  (INCLUDING MRCP) TECHNIQUE: Multiplanar multisequence MR imaging of the abdomen was performed. Heavily T2-weighted images of the biliary and pancreatic ducts were obtained, and three-dimensional MRCP images were rendered by post processing. COMPARISON:  No prior abdominal MRI. CT the abdomen and pelvis 11/11/2018. FINDINGS: Comment: Study is limited for detection and characterization of visceral and/or vascular lesions by lack of IV gadolinium. Lower chest: Trace right pleural effusion. Complex multilocular left pleural effusion with probable atelectasis in the left lower lobe. Hepatobiliary: Mild diffuse loss of signal intensity throughout the hepatic parenchyma on out of phase dual echo images, indicative of mild hepatic steatosis. No discrete cystic or solid hepatic lesions are confidently identified on today's noncontrast examination. Gallbladder is normal in appearance. No intra or extrahepatic biliary ductal dilatation noted on MRCP images. Common bile duct is normal in caliber  measuring 5 mm in the porta hepatis. No filling defects in the common bile duct to suggest choledocholithiasis. Pancreas: No definite pancreatic mass identified on today's noncontrast examination. No pancreatic ductal dilatation noted on MRCP images. No pancreatic or peripancreatic fluid collections or inflammatory changes. Spleen:  Unremarkable. Adrenals/Urinary Tract: Unenhanced appearance of the kidneys and bilateral adrenal glands is unremarkable. No hydroureteronephrosis in the visualized portions of the abdomen. Stomach/Bowel: Visualized portions are unremarkable. Vascular/Lymphatic: Aortic atherosclerosis without definite aneurysm in the abdomen on today's noncontrast examination. No lymphadenopathy noted in the abdomen. Other: No significant volume of ascites in the visualized portions of the peritoneal cavity. Trace amount of perinephric fluid bilaterally (nonspecific). Musculoskeletal: No aggressive appearing osseous lesions are noted in the visualized portions of the skeleton.  IMPRESSION: 1. No findings to suggest biliary tract obstruction. No choledocholithiasis. 2. Mild hepatic steatosis. 3. Trace right pleural effusion and large complex multilocular left pleural effusion with probable atelectasis in the left lower lobe. 4. Aortic atherosclerosis. Electronically Signed   By: Trudie Reed M.D.   On: 11/12/2018 10:26   Mr 3d Recon At Scanner  Result Date: 11/12/2018 CLINICAL DATA:  63 year old male with history of abnormal liver function tests. Cholelithiasis. Right upper quadrant abdominal pain. EXAM: MRI ABDOMEN WITHOUT CONTRAST  (INCLUDING MRCP) TECHNIQUE: Multiplanar multisequence MR imaging of the abdomen was performed. Heavily T2-weighted images of the biliary and pancreatic ducts were obtained, and three-dimensional MRCP images were rendered by post processing. COMPARISON:  No prior abdominal MRI. CT the abdomen and pelvis 11/11/2018. FINDINGS: Comment: Study is limited for detection and  characterization of visceral and/or vascular lesions by lack of IV gadolinium. Lower chest: Trace right pleural effusion. Complex multilocular left pleural effusion with probable atelectasis in the left lower lobe. Hepatobiliary: Mild diffuse loss of signal intensity throughout the hepatic parenchyma on out of phase dual echo images, indicative of mild hepatic steatosis. No discrete cystic or solid hepatic lesions are confidently identified on today's noncontrast examination. Gallbladder is normal in appearance. No intra or extrahepatic biliary ductal dilatation noted on MRCP images. Common bile duct is normal in caliber measuring 5 mm in the porta hepatis. No filling defects in the common bile duct to suggest choledocholithiasis. Pancreas: No definite pancreatic mass identified on today's noncontrast examination. No pancreatic ductal dilatation noted on MRCP images. No pancreatic or peripancreatic fluid collections or inflammatory changes. Spleen:  Unremarkable. Adrenals/Urinary Tract: Unenhanced appearance of the kidneys and bilateral adrenal glands is unremarkable. No hydroureteronephrosis in the visualized portions of the abdomen. Stomach/Bowel: Visualized portions are unremarkable. Vascular/Lymphatic: Aortic atherosclerosis without definite aneurysm in the abdomen on today's noncontrast examination. No lymphadenopathy noted in the abdomen. Other: No significant volume of ascites in the visualized portions of the peritoneal cavity. Trace amount of perinephric fluid bilaterally (nonspecific). Musculoskeletal: No aggressive appearing osseous lesions are noted in the visualized portions of the skeleton. IMPRESSION: 1. No findings to suggest biliary tract obstruction. No choledocholithiasis. 2. Mild hepatic steatosis. 3. Trace right pleural effusion and large complex multilocular left pleural effusion with probable atelectasis in the left lower lobe. 4. Aortic atherosclerosis. Electronically Signed   By: Trudie Reed M.D.   On: 11/12/2018 10:26   US Abdomen Limited Ruq  Result Date: 11/11/2018 CLINICAL DATA:  Pain EXAM: ULTRASOUND ABDOMEN LIMITED RIGHT UPPER QUADRANT COMPARISON:  None. FINDINGS: Gallbladder: Multiple gallstones are noted. There is gallbladder wall thickening with the gallbladder wall measuring approximately 4 mm. The sonographic Eulah Pont sign is negative. There is no pericholecystic free fluid. Common bile duct: Diameter: 4 mm Liver: No focal lesion identified. Within normal limits in parenchymal echogenicity. Portal vein is patent on color Doppler imaging with normal direction of blood flow towards the liver. Other: None. IMPRESSION: Cholelithiasis with mild gallbladder wall thickening. In the absence of a positive sonographic Murphy sign, these findings are equivocal for acute cholecystitis. If there is high clinical suspicion for acute cholecystitis, follow-up with HIDA scan is recommended. Electronically Signed   By: Katherine Mantle M.D.   On: 11/11/2018 20:28   ASSESSMENT AND PLAN:   Sepsis secondary to necrotizing pneumonia/empyema- meeting sepsis criteria on admission with tachycardia, leukocytosis, and lactic acidosis. -Cardiothoracic surgery following-recommend that IR place a large pigtail catheter- this will happen today -F/u fluid studies -MRSA PCR is negative,  so will stop vancomycin and continue Zosyn -Follow-up blood and urine cultures -Trend lactic acid -Continue IV fluids  Elevated LFTs- may be due to ischemic hepatitis in the setting of sepsis vs alcoholic hepatitis.  -RUQ ultrasound was suggestive of cholelithiasis and showed some possible gallbladder wall thickening, but doubt cholecystitis without any right upper quadrant pain -MRCP performed today with no evidence of biliary tract obstruction -General surgery consulted -GI consulted -Acute hepatitis panel is pending -Hold tylenol and statin -Recheck CMP in the morning  AKI- likely due to sepsis and  dehydration in the setting of vomiting.  Creatinine has worsened from yesterday to today. -Continue IV fluids -Check renal ultrasound -Stop vancomycin -Avoid nephrotoxic agents  Elevated troponin- likely demand ischemia in the setting of sepsis.  Patient denies any active cardiac chest pain. -Monitor  A. fib with RVR- likely due to sepsis resolved with a dose of IV Cardizem yesterday.  Patient is currently in normal sinus rhythm. -Continue heparin drip -Echo is pending -Cardiac monitoring  Alcohol abuse- son states patient has been drinking a lot recently- usually 4 Coffey and 1/2 pint of liquor per day. -Start CIWA  Tobacco use -Nicotine patch daily  All the records are reviewed and case discussed with Care Management/Social Worker. Management plans discussed with the patient, family and they are in agreement.  CODE STATUS: Full Code  TOTAL TIME TAKING CARE OF THIS PATIENT: 45 minutes.   More than 50% of the time was spent in counseling/coordination of care: YES  POSSIBLE D/C IN 3-4 DAYS, DEPENDING ON CLINICAL CONDITION.   Berna Spare Sashay Felling M.D on 11/12/2018 at 3:03 PM  Between 7am to 6pm - Pager [phone]  After 6pm go to [email or web address] - Proofreader  [facility] [place] [facility]  Office  [phone]  CC: Primary care physician; Patient, [name]  Note: This dictation was prepared with Dragon dictation along with smaller phrase technology. Any transcriptional errors that result from this process are unintentional.

## 2018-11-13 ENCOUNTER — Inpatient Hospital Stay: Payer: Medicaid Other

## 2018-11-13 DIAGNOSIS — B179 Acute viral hepatitis, unspecified: Secondary | ICD-10-CM | POA: Diagnosis not present

## 2018-11-13 LAB — CBC
HCT: 34.5 % — ABNORMAL LOW (ref 39.0–52.0)
Hemoglobin: 11.6 g/dL — ABNORMAL LOW (ref 13.0–17.0)
MCH: 31.5 pg (ref 26.0–34.0)
MCHC: 33.6 g/dL (ref 30.0–36.0)
MCV: 93.8 fL (ref 80.0–100.0)
Platelets: 305 10*3/uL (ref 150–400)
RBC: 3.68 MIL/uL — ABNORMAL LOW (ref 4.22–5.81)
RDW: 13.9 % (ref 11.5–15.5)
WBC: 20.6 10*3/uL — ABNORMAL HIGH (ref 4.0–10.5)
nRBC: 0.1 % (ref 0.0–0.2)

## 2018-11-13 LAB — COMPREHENSIVE METABOLIC PANEL
ALT: 517 U/L — ABNORMAL HIGH (ref 0–44)
AST: 1488 U/L — ABNORMAL HIGH (ref 15–41)
Albumin: 2.1 g/dL — ABNORMAL LOW (ref 3.5–5.0)
Alkaline Phosphatase: 100 U/L (ref 38–126)
Anion gap: 8 (ref 5–15)
BUN: 52 mg/dL — ABNORMAL HIGH (ref 8–23)
CO2: 22 mmol/L (ref 22–32)
Calcium: 8 mg/dL — ABNORMAL LOW (ref 8.9–10.3)
Chloride: 105 mmol/L (ref 98–111)
Creatinine, Ser: 2.06 mg/dL — ABNORMAL HIGH (ref 0.61–1.24)
GFR calc Af Amer: 39 mL/min — ABNORMAL LOW (ref >60)
GFR calc non Af Amer: 33 mL/min — ABNORMAL LOW (ref >60)
Glucose, Bld: 120 mg/dL — ABNORMAL HIGH (ref 70–99)
Potassium: 3.5 mmol/L (ref 3.5–5.1)
Sodium: 135 mmol/L (ref 135–145)
Total Bilirubin: 1.8 mg/dL — ABNORMAL HIGH (ref 0.3–1.2)
Total Protein: 5.2 g/dL — ABNORMAL LOW (ref 6.5–8.1)

## 2018-11-13 LAB — BODY FLUID CELL COUNT WITH DIFFERENTIAL
Eos, Fluid: 0 %
Lymphs, Fluid: 10 %
Monocyte-Macrophage-Serous Fluid: 15 %
Neutrophil Count, Fluid: 75 %
Total Nucleated Cell Count, Fluid: 89976 cu mm

## 2018-11-13 LAB — HEPARIN LEVEL (UNFRACTIONATED)
Heparin Unfractionated: <0.1 IU/mL — ABNORMAL LOW (ref 0.30–0.70)
Heparin Unfractionated: 0.1 IU/mL — ABNORMAL LOW (ref 0.30–0.70)
Heparin Unfractionated: 0.11 IU/mL — ABNORMAL LOW (ref 0.30–0.70)

## 2018-11-13 LAB — GLUCOSE, BODY FLUID OTHER: Glucose, Body Fluid Other: <20

## 2018-11-13 LAB — APTT: aPTT: 73 seconds — ABNORMAL HIGH (ref 24–36)

## 2018-11-13 LAB — LACTATE DEHYDROGENASE, PLEURAL OR PERITONEAL FLUID: LD, Fluid: 7726 U/L — ABNORMAL HIGH (ref 3–23)

## 2018-11-13 LAB — LACTIC ACID, PLASMA: Lactic Acid, Venous: 2.3 mmol/L (ref 0.5–1.9)

## 2018-11-13 MED ORDER — OXYCODONE HCL 5 MG PO TABS
5.0000 mg | ORAL_TABLET | ORAL | Status: DC | PRN
  Administered 2018-11-14 – 2018-11-25 (×38): 5 mg via ORAL
  Filled 2018-11-13 (×40): qty 1

## 2018-11-13 MED ORDER — LACTATED RINGERS IV BOLUS
500.0000 mL | Freq: Once | INTRAVENOUS | Status: AC
  Administered 2018-11-13: 500 mL via INTRAVENOUS

## 2018-11-13 MED ORDER — HEPARIN BOLUS VIA INFUSION
2000.0000 [IU] | Freq: Once | INTRAVENOUS | Status: AC
  Administered 2018-11-13: 2000 [IU] via INTRAVENOUS
  Filled 2018-11-13: qty 2000

## 2018-11-13 MED ORDER — HEPARIN BOLUS VIA INFUSION
2150.0000 [IU] | Freq: Once | INTRAVENOUS | Status: AC
  Administered 2018-11-13: 2150 [IU] via INTRAVENOUS
  Filled 2018-11-13: qty 2150

## 2018-11-13 MED ORDER — HEPARIN BOLUS VIA INFUSION
1000.0000 [IU] | Freq: Once | INTRAVENOUS | Status: AC
  Administered 2018-11-13: 1000 [IU] via INTRAVENOUS
  Filled 2018-11-13: qty 1000

## 2018-11-13 MED ORDER — HEPARIN (PORCINE) 25000 UT/250ML-% IV SOLN
1700.0000 [IU]/h | INTRAVENOUS | Status: DC
  Administered 2018-11-13: 1500 [IU]/h via INTRAVENOUS
  Filled 2018-11-13: qty 250

## 2018-11-13 NOTE — Progress Notes (Signed)
ANTICOAGULATION CONSULT NOTE - Follow up [place] for Heparin Indication: chest pain/ACS  No Known Allergies  Patient Measurements: Height: 5\' 9"  (175.3 cm) Weight: 158 lb 11.7 oz (72 kg) IBW/kg (Calculated) : 70.7 HEPARIN DW (KG): 72  Vital Signs: Temp: 97.5 F (36.4 C) (10/27 2052) Temp Source: Oral (10/27 2052) BP: 110/67 (10/27 2052) Pulse Rate: 101 (10/27 2052)  Labs: Recent Labs    11/11/18 1836 11/11/18 1938 11/11/18 2345 11/12/18 0501 11/12/18 0816 11/12/18 1354 11/12/18 1635 11/13/18 0026  HGB 16.1  --   --  12.1*  --   --  11.4*  --   HCT 49.1  --   --  34.7*  --   --  34.2*  --   PLT 363  --   --  285  --   --  295  --   APTT  --  39*  --   --   --   --   --   --   LABPROT  --  26.3*  --   --   --   --  21.7*  --   INR  --  2.5*  --   --   --   --  1.9*  --   HEPARINUNFRC  --   --   --   --   --  <0.10*  --  <0.10*  CREATININE 2.04*  --   --  2.25*  --   --   --   --   TROPONINIHS 41*  --  439*  --  425*  --   --   --     Estimated Creatinine Clearance: 33.6 mL/min (A) (by C-G formula based on SCr of 2.25 mg/dL (H)).  Assessment: Pharmacy asked to initiate and monitor Heparin for elevated troponin.  Pt not on any anticoagulants PTA per med list.  INR and aPTT is elevated on admission.  Heparin infusion started at 850 units/hr, no bolus due to elevated INR  Goal of Therapy:  Heparin level 0.3-0.7 units/ml Monitor platelets by anticoagulation protocol: Yes   Plan:  HL at 1354 <0.10, heparin drip stopped at 1319 for chest tube placement (delayed entry on MAR). Will restart heparin drip at 900 units/hr.   10/28:  HL at 0026 < 0.10, subtherapeutic.  INR dropped to 1.9.  Will give 1000 unit bolus and increase infusion to 1100 units/hr.  Will check heparin level 6 hours after rate change.   CBC in AM  Pharmacy will continue to follow.   [place] Crane, Sussan Meter A 11/13/2018,2:18 AM

## 2018-11-13 NOTE — Progress Notes (Addendum)
[facility] - [place] at [facility]   PATIENT NAME: Thomas Coffey    MR#:  [id number]  DATE OF BIRTH:  September 26, 1955  SUBJECTIVE:   Patient states he is feeling okay today.  He endorses left chest wall pain that radiates down his left flank.  He endorses dry cough.  The left chest wall pain is worse with coughing.  He endorses shortness of breath.  He denies any right upper quadrant pain, epigastric pain, nausea, or vomiting.  REVIEW OF SYSTEMS:  Review of Systems  Constitutional: Negative for chills and fever.  HENT: Negative for congestion and sore throat.   Eyes: Negative for blurred vision and double vision.  Respiratory: Positive for cough and shortness of breath.   Cardiovascular: Positive for chest pain. Negative for palpitations and leg swelling.  Gastrointestinal: Negative for abdominal pain, nausea and vomiting.  Genitourinary: Negative for dysuria and urgency.  Musculoskeletal: Negative for back pain and neck pain.  Neurological: Negative for dizziness and headaches.  Psychiatric/Behavioral: Negative for depression. The patient is not nervous/anxious.    DRUG ALLERGIES:  No Known Allergies VITALS:  Blood pressure 117/83, pulse 91, temperature 97.6 F (36.4 C), temperature source Oral, resp. rate 18, height 5\' 9"  (1.753 m), weight 75.3 kg, SpO2 95 %. PHYSICAL EXAMINATION:  Physical Exam  GENERAL:  Laying in the bed with no acute distress.  Thin appearing. HEENT: Head atraumatic, normocephalic. Pupils equal, round, reactive to light and accommodation. No scleral icterus. Extraocular muscles intact. Oropharynx and nasopharynx clear.  NECK:  Supple, no jugular venous distention. No thyroid enlargement. LUNGS: Lungs are clear to auscultation bilaterally. No wheezes, crackles, rhonchi. No use of accessory muscles of respiration.  CARDIOVASCULAR: RRR, S1, S2 normal. No murmurs, rubs, or gallops.  ABDOMEN: Soft, nontender, nondistended. Bowel sounds present.   Negative Murphy sign. EXTREMITIES: No pedal edema, cyanosis, or clubbing.  NEUROLOGIC: CN 2-12 intact, no focal deficits. 5/5 muscle strength throughout all extremities. Sensation intact throughout. Gait not checked.  PSYCHIATRIC: The patient is alert and oriented x 3.  SKIN: No obvious rash, lesion, or ulcer.  LABORATORY PANEL:  Male CBC Recent Labs  Lab 11/13/18 0455  WBC 20.6*  HGB 11.6*  HCT 34.5*  PLT 305   ------------------------------------------------------------------------------------------------------------------ Chemistries  Recent Labs  Lab 11/12/18 1635 11/13/18 0455  NA  --  135  K  --  3.5  CL  --  105  CO2  --  22  GLUCOSE  --  120*  BUN  --  52*  CREATININE  --  2.06*  CALCIUM  --  8.0*  MG 2.7*  --   AST 2,249* 1,488*  ALT 572* 517*  ALKPHOS 87 100  BILITOT 1.7* 1.8*   RADIOLOGY:  Dg Chest 2 View  Result Date: 11/13/2018 CLINICAL DATA:  Status post left chest tube placement for drainage of empyema. EXAM: CHEST - 2 VIEW COMPARISON:  11/11/2018 FINDINGS: The heart size and mediastinal contours are within normal limits. Interval placement of left-sided pleural pigtail catheter with improved aeration of the left lung. No pneumothorax identified. Bibasilar atelectasis present. The visualized skeletal structures are unremarkable. IMPRESSION: No pneumothorax identified after placement of left-sided pleural pigtail catheter. Bibasilar atelectasis present with improved aeration of the left lung. Electronically Signed   By: 11/13/2018 M.D.   On: 11/13/2018 08:59   11/15/2018 Renal  Result Date: 11/13/2018 CLINICAL DATA:  Acute kidney injury EXAM: RENAL / URINARY TRACT ULTRASOUND COMPLETE COMPARISON:  CT 11/11/2018 FINDINGS: Right Kidney: Renal  measurements: 13.7 x 9.5 x 5.9 cm = volume: 231 mL. Mildly increased echotexture throughout the right kidney. No mass or hydronephrosis. Left Kidney: Renal measurements: 12.8 x 5.9 x 5.1 cm = volume: 200 mL. Echogenicity  within normal limits. No mass or hydronephrosis visualized. Bladder: Appears normal for degree of bladder distention. Other: IMPRESSION: Increased echotexture in the kidneys bilaterally suggesting medical renal disease. No hydronephrosis. Electronically Signed   By: Rolm Baptise M.D.   On: 11/13/2018 09:07   Ct Image Guided Drainage By Percutaneous Catheter  Result Date: 11/12/2018 INDICATION: 63 year old with left chest empyema. EXAM: CT-GUIDED PLACEMENT OF LEFT CHEST TUBE MEDICATIONS: Moderate sedation ANESTHESIA/SEDATION: 2.0 mg IV Versed 100 mcg IV Fentanyl Moderate Sedation Time:  15 minutes The patient was continuously monitored during the procedure by the interventional radiology nurse under my direct supervision. COMPLICATIONS: None immediate. TECHNIQUE: Informed written consent was obtained from the patient after a thorough discussion of the procedural risks, benefits and alternatives. All questions were addressed. A timeout was performed prior to the initiation of the procedure. PROCEDURE: Patient was placed on his right side. Images through the chest were obtained. Left side of the chest was prepped with chlorhexidine and sterile field was created. Skin and soft tissues were anesthetized with 1% lidocaine. Using CT guidance, a Yueh catheter was directed into the pleural space and cloudy yellow fluid was aspirated. Stiff Amplatz wire was advanced into the pleural space. The tract was dilated to accommodate a 14 [place] multipurpose drain. Additional cloudy yellow fluid was collected and sent for culture. Catheter was sutured to skin and attached to a PleurEvac collection device. Dressing was placed over the chest tube. FINDINGS: Complex left pleural effusion containing pockets of gas. Consolidation and volume loss in left lung. Cloudy yellow fluid was removed from the left pleural space. 77 French catheter positioned within the left pleural space. IMPRESSION: CT-guided placement of a left chest tube.  Electronically Signed   By: Markus Daft M.D.   On: 11/12/2018 16:26   ASSESSMENT AND PLAN:   Sepsis secondary to necrotizing pneumonia/empyema- meeting sepsis criteria on admission with tachycardia, leukocytosis, and lactic acidosis, all of which are improving. -s/p CT-guided left chest tube placement 10/27 -Cardiothoracic surgery following -Body fluid studies ordered today -Body fluid cultures with no growth to date -Continue zosyn -Blood cultures with no growth -Trend lactic acid -Continue IV fluids  Acute hepatitis- may be due to ischemic hepatitis in the setting of sepsis vs alcoholic hepatitis. Improving. -RUQ ultrasound was suggestive of cholelithiasis and showed some possible gallbladder wall thickening, but doubt cholecystitis without any right upper quadrant pain -MRCP performed 10/27 with no evidence of biliary tract obstruction -General surgery consulted -GI consulted -Acute hepatitis panel was negative -Avoid nephrotoxins  AKI- likely due to sepsis and dehydration in the setting of vomiting.  Creatinine has improved. -Continue IV fluids -Renal ultrasound with medical renal disease -Avoid nephrotoxic agents  Elevated troponin- likely demand ischemia in the setting of sepsis.  Patient denies any active cardiac chest pain. -Monitor  A. fib with RVR- likely due to sepsis, resolved with a dose of IV Cardizem yesterday.  Patient is currently in normal sinus rhythm. -Continue heparin drip -ECHO with EF 60-65%, mild LVH -Cardiac monitoring  Alcohol abuse- drinks 4 beers and 1/2 pint of liquor per day. -CIWA  Tobacco use -Nicotine patch daily  Son, [name], updated via phone.  All the records are reviewed and case discussed with Care Management/Social Worker. Management plans discussed with the patient, family  and they are in agreement.  CODE STATUS: Full Code  TOTAL TIME TAKING CARE OF THIS PATIENT: 38 minutes.   More than 50% of the time was spent in  counseling/coordination of care: YES  POSSIBLE D/C IN 3-4 DAYS, DEPENDING ON CLINICAL CONDITION.   Jinny Blossom Marly Schuld M.D on 11/13/2018 at 1:00 PM  Between 7am to 6pm - Pager [phone]  After 6pm go to [email or web address] - Social research officer, government  [facility]  Office  [phone]  CC: Primary care physician; Patient, [name]  Note: This dictation was prepared with Dragon dictation along with smaller phrase technology. Any transcriptional errors that result from this process are unintentional.

## 2018-11-13 NOTE — Progress Notes (Signed)
ANTICOAGULATION CONSULT NOTE - Follow up [place] for Heparin Indication: chest pain/ACS  No Known Allergies  Patient Measurements: Height: 5\' 9"  (175.3 cm) Weight: 166 lb (75.3 kg)(pt refused to stand) IBW/kg (Calculated) : 70.7 HEPARIN DW (KG): 72  Vital Signs: Temp: 98.1 F (36.7 C) (10/28 1520) Temp Source: Oral (10/28 1520) BP: 100/65 (10/28 1520) Pulse Rate: 91 (10/28 1520)  Labs: Recent Labs    11/11/18 1836 11/11/18 1938 11/11/18 2345 11/12/18 0501 11/12/18 0816  11/12/18 1635 11/13/18 0026 11/13/18 0455 11/13/18 0947 11/13/18 1712  HGB 16.1  --   --  12.1*  --   --  11.4*  --  11.6*  --   --   HCT 49.1  --   --  34.7*  --   --  34.2*  --  34.5*  --   --   PLT 363  --   --  285  --   --  295  --  305  --   --   APTT  --  39*  --   --   --   --   --   --  73*  --   --   LABPROT  --  26.3*  --   --   --   --  21.7*  --   --   --   --   INR  --  2.5*  --   --   --   --  1.9*  --   --   --   --   HEPARINUNFRC  --   --   --   --   --    < >  --  <0.10*  --  0.10* 0.11*  CREATININE 2.04*  --   --  2.25*  --   --   --   --  2.06*  --   --   TROPONINIHS 41*  --  439*  --  425*  --   --   --   --   --   --    < > = values in this interval not displayed.    Estimated Creatinine Clearance: 36.7 mL/min (A) (by C-G formula based on SCr of 2.06 mg/dL (H)).  Assessment: Pharmacy asked to initiate and monitor Heparin for elevated troponin.  Pt not on any anticoagulants PTA per med list.  INR and aPTT is elevated on admission.  Heparin infusion started at 850 units/hr, no bolus due to elevated INR 10/28 @0947  HL 0.1  10/28 @ 1712 HL 0.11. Heparin level is subtherapeutic s/p bolus and rate increase. Confirmed with floor nurse heparin had not been interrupted.   Goal of Therapy:  Heparin level 0.3-0.7 units/ml Monitor platelets by anticoagulation protocol: Yes   Plan:  Will give 2150 unit bolus and increase infusion to 1500 units/hr.  Will check  heparin level 6 hours after rate change. CBC stable.   Pharmacy will continue to follow.   Rowland Lathe, PharmD 11/13/2018,6:19 PM

## 2018-11-13 NOTE — Progress Notes (Signed)
CRITICAL VALUE ALERT  Critical Value:  lactid acid 2.3  Date & Time Notied:  11/13/18.1119  Provider Notified: Dr. Brett Albino  Orders Received/Actions taken: pt is already on continuous fluids/abx

## 2018-11-13 NOTE — Progress Notes (Signed)
Pt difficult IV stick, put IV team consult, per IV team nurse pt refused to get IV stick, pt was not able to tolerated it.

## 2018-11-13 NOTE — Progress Notes (Signed)
ANTICOAGULATION CONSULT NOTE - Follow up [place] for Heparin Indication: chest pain/ACS  No Known Allergies  Patient Measurements: Height: 5\' 9"  (175.3 cm) Weight: 166 lb (75.3 kg)(pt refused to stand) IBW/kg (Calculated) : 70.7 HEPARIN DW (KG): 72  Vital Signs: Temp: 97.6 F (36.4 C) (10/28 0934) Temp Source: Oral (10/28 0934) BP: 117/83 (10/28 0934) Pulse Rate: 91 (10/28 0934)  Labs: Recent Labs    11/11/18 1836 11/11/18 1938 11/11/18 2345 11/12/18 0501 11/12/18 0816 11/12/18 1354 11/12/18 1635 11/13/18 0026 11/13/18 0455 11/13/18 0947  HGB 16.1  --   --  12.1*  --   --  11.4*  --  11.6*  --   HCT 49.1  --   --  34.7*  --   --  34.2*  --  34.5*  --   PLT 363  --   --  285  --   --  295  --  305  --   APTT  --  39*  --   --   --   --   --   --  73*  --   LABPROT  --  26.3*  --   --   --   --  21.7*  --   --   --   INR  --  2.5*  --   --   --   --  1.9*  --   --   --   HEPARINUNFRC  --   --   --   --   --  <0.10*  --  <0.10*  --  0.10*  CREATININE 2.04*  --   --  2.25*  --   --   --   --  2.06*  --   TROPONINIHS 41*  --  439*  --  425*  --   --   --   --   --     Estimated Creatinine Clearance: 36.7 mL/min (A) (by C-G formula based on SCr of 2.06 mg/dL (H)).  Assessment: Pharmacy asked to initiate and monitor Heparin for elevated troponin.  Pt not on any anticoagulants PTA per med list.  INR and aPTT is elevated on admission.  Heparin infusion started at 850 units/hr, no bolus due to elevated INR 10/28 @0947  HL 0.1   Goal of Therapy:  Heparin level 0.3-0.7 units/ml Monitor platelets by anticoagulation protocol: Yes   Plan:  Heparin level is subtherapeutic.  Will give 2000 unit bolus and increase infusion to 1300 units/hr.  Will check heparin level 6 hours after rate change. CBC stable.   Pharmacy will continue to follow.   Oswald Hillock, PharmD, BCPS 11/13/2018,10:53 AM

## 2018-11-13 NOTE — Progress Notes (Signed)
  Patient ID: Thomas Coffey, male   DOB: 1955/10/01, 63 y.o.   MRN: [id number]  HISTORY: He says he feels much better overall.  There is a small amount of clear yellow fluid in chest tube.  Does not appear like pus.  No fluid studies sent yesterday   Vitals:   11/13/18 0402 11/13/18 0404  BP: 99/66 97/63  Pulse: 94 93  Resp: 20   Temp: 97.7 F (36.5 C)   SpO2: (!) 83% (!) 87%     EXAM:    Resp: Lungs are clear on the right but with mechanical chest tube sounds on the left.  No respiratory distress, normal effort. Heart:  Regular without murmurs Abd:  Abdomen is soft, non distended and non tender. No masses are palpable.  There is no rebound and no guarding.  Neurological: Alert and oriented to person, place, and time. Coordination normal.  Skin: Skin is warm and dry. No rash noted. No diaphoretic. No erythema. No pallor.  Psychiatric: Normal mood and affect. Normal behavior. Judgment and thought content normal.    ASSESSMENT: Parapneumonic effusion on left.  Reviewed the indications and risks of intrapleural TPA.  Aware of the risk of bleeding as well.   PLAN:   Would recommend that we send fluid for analysis Would consider using intrapleural TPA but he is already on heparin drip - concerning for increased risk of bleeding Will check CXRay    Nestor Lewandowsky, MD

## 2018-11-13 NOTE — Progress Notes (Signed)
Arlyss Repress, MD [address]  [address]  [place], [place] [address]  Main: [phone]  Fax: [phone] Pager: [phone]   Subjective: He reports that his appetite is coming back.  He denies abdominal pain, nausea or vomiting.  Has been hemodynamically stable.  He underwent chest tube placement yesterday   Objective: Vital signs in last 24 hours: Vitals:   11/13/18 0402 11/13/18 0404 11/13/18 0934 11/13/18 1520  BP: 99/66 97/63 117/83 100/65  Pulse: 94 93 91 91  Resp: Temp: 97.7 F (36.5 C)  97.6 F (36.4 C) 98.1 F (36.7 C)  TempSrc: Oral  Oral Oral  SpO2: (!) 83% (!) 87% 95% 90%  Weight:  75.3 kg    Height:       Weight change: -4.082 kg  Intake/Output Summary (Last 24 hours) at 11/13/2018 1826 Last data filed at 11/13/2018 1658 Gross per 24 hour  Intake 1803.87 ml  Output 325 ml  Net 1478.87 ml     Exam: Heart:: Regular rate and rhythm, S1S2 present or without murmur or extra heart sounds Lungs: Decreased breath sounds in his left lung, chest tube in place Abdomen: soft, nontender, normal bowel sounds   Lab Results: CBC Latest Ref Rng & Units 11/13/2018 11/12/2018 11/12/2018  WBC 4.0 - 10.5 K/uL 20.6(H) 21.3(H) 20.9(H)  Hemoglobin 13.0 - 17.0 g/dL 11.6(L) 11.4(L) 12.1(L)  Hematocrit 39.0 - 52.0 % 34.5(L) 34.2(L) 34.7(L)  Platelets 150 - 400 K/uL 305 295 285   CMP Latest Ref Rng & Units 11/13/2018 11/12/2018 11/12/2018  Glucose 70 - 99 mg/dL 962(X) - 52(W)  BUN 8 - 23 mg/dL 41(L) - 24(M)  Creatinine 0.61 - 1.24 mg/dL 0.10(U) - 7.25(D)  Sodium 135 - 145 mmol/L 135 - 136  Potassium 3.5 - 5.1 mmol/L 3.5 - 3.9  Chloride 98 - 111 mmol/L 105 - 103  CO2 22 - 32 mmol/L 22 - 19(L)  Calcium 8.9 - 10.3 mg/dL 8.0(L) - 7.5(L)  Total Protein 6.5 - 8.1 g/dL 5.2(L) 5.0(L) 5.6(L)  Total Bilirubin 0.3 - 1.2 mg/dL 6.6(Y) 4.0(H) 2.0(H)  Alkaline Phos 38 - 126 U/L 100 87 93  AST 15 - 41 U/L 1,488(H) 2,249(H) 2,239(H)  ALT 0 - 44 U/L 517(H)  572(H) 572(H)    Micro Results: Recent Results (from the past 240 hour(s))  Blood Culture (routine x 2)     Status: None (Preliminary result)   Collection Time: 11/11/18  6:15 PM   Specimen: BLOOD  Result Value Ref Range Status   Specimen Description BLOOD BLOOD LEFT WRIST  Final   Special Requests   Final    BOTTLES DRAWN AEROBIC AND ANAEROBIC Blood Culture adequate volume   Culture   Final    NO GROWTH 2 DAYS Performed at [facility], [address], [place], [place] [address]    Report Status PENDING  Incomplete  SARS CORONAVIRUS 2 (TAT 6-24 HRS) Nasopharyngeal Nasopharyngeal Swab     Status: None   Collection Time: 11/11/18 10:41 PM   Specimen: Nasopharyngeal Swab  Result Value Ref Range Status   SARS Coronavirus 2 NEGATIVE NEGATIVE Final    Comment: (NOTE) SARS-CoV-2 target nucleic acids are NOT DETECTED. The SARS-CoV-2 RNA is generally detectable in upper and lower respiratory specimens during the acute phase of infection. Negative results do not preclude SARS-CoV-2 infection, do not rule out co-infections with other pathogens, and should not be used as the sole basis for treatment or other patient management decisions.  Negative results must be combined with clinical observations, patient history, and epidemiological information. The expected result is Negative. Fact Sheet for Patients: HairSlick.nohttps://www.fda.gov/media/138098/download Fact Sheet for Healthcare Providers: quierodirigir.comhttps://www.fda.gov/media/138095/download This test is not yet approved or cleared by the [place] FDA and  has been authorized for detection and/or diagnosis of SARS-CoV-2 by FDA under an Emergency Use Authorization (EUA). This EUA will remain  in effect (meaning this test can be used) for the duration of the COVID-19 declaration under Section 56 4(b)(1) of the Act, 21 U.S.C. section 360bbb-3(b)(1), unless the authorization is terminated or revoked sooner. Performed at [facility], [address] [place], [place] [address]   Blood Culture (routine x 2)     Status: None (Preliminary result)   Collection Time: 11/11/18 11:45 PM   Specimen: BLOOD  Result Value Ref Range Status   Specimen Description BLOOD BLOOD LEFT WRIST  Final   Special Requests   Final    BOTTLES DRAWN AEROBIC AND ANAEROBIC Blood Culture adequate volume   Culture   Final    NO GROWTH 2 DAYS Performed at [facility], [address] [place], [place] [address]    Report Status PENDING  Incomplete  MRSA PCR Screening     Status: None   Collection Time: 11/12/18  7:50 AM   Specimen: Nasal Mucosa; Nasopharyngeal  Result Value Ref Range Status   MRSA by PCR NEGATIVE NEGATIVE Final    Comment:        The GeneXpert MRSA Assay (FDA approved for NASAL specimens only), is one component of a comprehensive MRSA colonization surveillance program. It is not intended to diagnose MRSA infection nor to guide or monitor treatment for MRSA infections. Performed at [facility], [address] [place], [place] [address]   Aerobic/Anaerobic Culture (surgical/deep wound)     Status: None (Preliminary result)   Collection Time: 11/12/18  5:08 PM   Specimen: Pleural Fluid  Result Value Ref Range Status   Specimen Description   Final    PLEURAL LEFT PLEURAL FLUID Performed at [facility], [address] [place], [place] [address]    Special Requests   Final    NONE Performed at [facility], [address] [place], [place] [address]    Gram Stain   Final    WBC PRESENT,BOTH PMN AND MONONUCLEAR NO ORGANISMS SEEN CYTOSPIN SMEAR    Culture   Final    NO GROWTH < 12 HOURS Performed at [facility], [address] [place], [place] [address]    Report Status PENDING  Incomplete   Studies/Results: Dg Chest 1 View  Result Date: 11/11/2018 CLINICAL DATA:  Chest pain EXAM: CHEST  1 VIEW COMPARISON:  07/15/2005 FINDINGS: Defibrillator pads overlie the left  lower chest. Increased left basilar ill-defined opacity obscures the left hemidiaphragm may represent atelectasis and/or consolidation. Difficult to exclude left lower lung pneumonia. Right lung remains clear. Normal heart size and vascularity. No large effusion or pneumothorax. Trachea midline. Aorta atherosclerotic. Degenerative changes of the spine. IMPRESSION: Increased left lower lung ill-defined opacity compatible with atelectasis or developing airspace disease. Electronically Signed   By: Judie PetitM.  Shick M.D.   On: 11/11/2018 19:28   Dg Chest 2 View  Result Date: 11/13/2018 CLINICAL DATA:  Status post left chest tube placement for drainage of empyema. EXAM: CHEST - 2 VIEW COMPARISON:  11/11/2018 FINDINGS: The heart size and mediastinal contours are within normal limits. Interval placement of left-sided pleural pigtail  catheter with improved aeration of the left lung. No pneumothorax identified. Bibasilar atelectasis present. The visualized skeletal structures are unremarkable. IMPRESSION: No pneumothorax identified after placement of left-sided pleural pigtail catheter. Bibasilar atelectasis present with improved aeration of the left lung. Electronically Signed   By: Irish Lack M.D.   On: 11/13/2018 08:59   Ct Angio Chest Pe W And/or Wo Contrast  Result Date: 11/11/2018 CLINICAL DATA:  Abdominal pain and complex chest pain. EXAM: CT ANGIOGRAPHY CHEST CT ABDOMEN AND PELVIS WITH CONTRAST TECHNIQUE: Multidetector CT imaging of the chest was performed using the standard protocol during bolus administration of intravenous contrast. Multiplanar CT image reconstructions and MIPs were obtained to evaluate the vascular anatomy. Multidetector CT imaging of the abdomen and pelvis was performed using the standard protocol during bolus administration of intravenous contrast. CONTRAST:  41mL OMNIPAQUE IOHEXOL 350 MG/ML SOLN COMPARISON:  CT dated July 15, 2005 FINDINGS: CTA CHEST FINDINGS Cardiovascular: Contrast  injection is sufficient to demonstrate satisfactory opacification of the pulmonary arteries to the segmental level. There is no pulmonary embolus. The main pulmonary artery is within normal limits for size. There is no CT evidence of acute right heart strain. The visualized aorta is normal. Heart size is normal, without pericardial effusion. Mediastinum/Nodes: --there is some mildly prominent mediastinal lymph nodes, likely reactive. --No axillary lymphadenopathy. --No supraclavicular lymphadenopathy. --Normal thyroid gland. --The esophagus is unremarkable Lungs/Pleura: There is a large complex left-sided empyema with multiple pockets of gas scattered throughout the collection both in a dependent and nondependent location. There is consolidation of the left lower lobe with findings raising concern for necrotizing pneumonia. Moderate emphysematous changes are noted bilaterally. There is a trace right-sided pleural effusion there is some mild interlobular septal thickening. There is some debris within the trachea. Musculoskeletal: No chest wall abnormality. No acute or significant osseous findings. Review of the MIP images confirms the above findings. CT ABDOMEN and PELVIS FINDINGS Hepatobiliary: There is decreased hepatic attenuation suggestive of hepatic steatosis. Normal gallbladder.There is no biliary ductal dilation. Pancreas: Normal contours without ductal dilatation. No peripancreatic fluid collection. Spleen: No splenic laceration or hematoma. Adrenals/Urinary Tract: --Adrenal glands: No adrenal hemorrhage. --Right kidney/ureter: No hydronephrosis or perinephric hematoma. --Left kidney/ureter: No hydronephrosis or perinephric hematoma. --Urinary bladder: Unremarkable. Stomach/Bowel: --Stomach/Duodenum: There is moderate distention of the stomach with an air-fluid level --Small bowel: No dilatation or inflammation. --Colon: The patient is status post right hemicolectomy. --Appendix: Surgically absent.  Vascular/Lymphatic: Normal course and caliber of the major abdominal vessels. --No retroperitoneal lymphadenopathy. --No mesenteric lymphadenopathy. --No pelvic or inguinal lymphadenopathy. Reproductive: Unremarkable Other: No ascites or free air. The abdominal wall is normal. Musculoskeletal. No acute displaced fractures. Review of the MIP images confirms the above findings. IMPRESSION: 1. Large complex left-sided empyema. 2. Consolidation of the left lower lobe with findings raising concern for necrotizing pneumonia. 3. Trace right-sided pleural effusion. 4. No acute intra-abdominal or pelvic process. 5. Hepatic steatosis. 6. Moderate distention of the stomach. 7. Status post right hemicolectomy. Aortic Atherosclerosis (ICD10-I70.0) and Emphysema (ICD10-J43.9). Electronically Signed   By: Katherine Mantle M.D.   On: 11/11/2018 21:37   Ct Abdomen Pelvis W Contrast  Result Date: 11/11/2018 CLINICAL DATA:  Abdominal pain and complex chest pain. EXAM: CT ANGIOGRAPHY CHEST CT ABDOMEN AND PELVIS WITH CONTRAST TECHNIQUE: Multidetector CT imaging of the chest was performed using the standard protocol during bolus administration of intravenous contrast. Multiplanar CT image reconstructions and MIPs were obtained to evaluate the vascular anatomy. Multidetector CT imaging of the abdomen and  pelvis was performed using the standard protocol during bolus administration of intravenous contrast. CONTRAST:  60mL OMNIPAQUE IOHEXOL 350 MG/ML SOLN COMPARISON:  CT dated July 15, 2005 FINDINGS: CTA CHEST FINDINGS Cardiovascular: Contrast injection is sufficient to demonstrate satisfactory opacification of the pulmonary arteries to the segmental level. There is no pulmonary embolus. The main pulmonary artery is within normal limits for size. There is no CT evidence of acute right heart strain. The visualized aorta is normal. Heart size is normal, without pericardial effusion. Mediastinum/Nodes: --there is some mildly prominent  mediastinal lymph nodes, likely reactive. --No axillary lymphadenopathy. --No supraclavicular lymphadenopathy. --Normal thyroid gland. --The esophagus is unremarkable Lungs/Pleura: There is a large complex left-sided empyema with multiple pockets of gas scattered throughout the collection both in a dependent and nondependent location. There is consolidation of the left lower lobe with findings raising concern for necrotizing pneumonia. Moderate emphysematous changes are noted bilaterally. There is a trace right-sided pleural effusion there is some mild interlobular septal thickening. There is some debris within the trachea. Musculoskeletal: No chest wall abnormality. No acute or significant osseous findings. Review of the MIP images confirms the above findings. CT ABDOMEN and PELVIS FINDINGS Hepatobiliary: There is decreased hepatic attenuation suggestive of hepatic steatosis. Normal gallbladder.There is no biliary ductal dilation. Pancreas: Normal contours without ductal dilatation. No peripancreatic fluid collection. Spleen: No splenic laceration or hematoma. Adrenals/Urinary Tract: --Adrenal glands: No adrenal hemorrhage. --Right kidney/ureter: No hydronephrosis or perinephric hematoma. --Left kidney/ureter: No hydronephrosis or perinephric hematoma. --Urinary bladder: Unremarkable. Stomach/Bowel: --Stomach/Duodenum: There is moderate distention of the stomach with an air-fluid level --Small bowel: No dilatation or inflammation. --Colon: The patient is status post right hemicolectomy. --Appendix: Surgically absent. Vascular/Lymphatic: Normal course and caliber of the major abdominal vessels. --No retroperitoneal lymphadenopathy. --No mesenteric lymphadenopathy. --No pelvic or inguinal lymphadenopathy. Reproductive: Unremarkable Other: No ascites or free air. The abdominal wall is normal. Musculoskeletal. No acute displaced fractures. Review of the MIP images confirms the above findings. IMPRESSION: 1. Large  complex left-sided empyema. 2. Consolidation of the left lower lobe with findings raising concern for necrotizing pneumonia. 3. Trace right-sided pleural effusion. 4. No acute intra-abdominal or pelvic process. 5. Hepatic steatosis. 6. Moderate distention of the stomach. 7. Status post right hemicolectomy. Aortic Atherosclerosis (ICD10-I70.0) and Emphysema (ICD10-J43.9). Electronically Signed   By: Katherine Mantlehristopher  Green M.D.   On: 11/11/2018 21:37   [place] Renal  Result Date: 11/13/2018 CLINICAL DATA:  Acute kidney injury EXAM: RENAL / URINARY TRACT ULTRASOUND COMPLETE COMPARISON:  CT 11/11/2018 FINDINGS: Right Kidney: Renal measurements: 13.7 x 9.5 x 5.9 cm = volume: 231 mL. Mildly increased echotexture throughout the right kidney. No mass or hydronephrosis. Left Kidney: Renal measurements: 12.8 x 5.9 x 5.1 cm = volume: 200 mL. Echogenicity within normal limits. No mass or hydronephrosis visualized. Bladder: Appears normal for degree of bladder distention. Other: IMPRESSION: Increased echotexture in the kidneys bilaterally suggesting medical renal disease. No hydronephrosis. Electronically Signed   By: Charlett NoseKevin  Dover M.D.   On: 11/13/2018 09:07   Mr Abdomen Mrcp Wo Contrast  Result Date: 11/12/2018 CLINICAL DATA:  63 year old male with history of abnormal liver function tests. Cholelithiasis. Right upper quadrant abdominal pain. EXAM: MRI ABDOMEN WITHOUT CONTRAST  (INCLUDING MRCP) TECHNIQUE: Multiplanar multisequence MR imaging of the abdomen was performed. Heavily T2-weighted images of the biliary and pancreatic ducts were obtained, and three-dimensional MRCP images were rendered by post processing. COMPARISON:  No prior abdominal MRI. CT the abdomen and pelvis 11/11/2018. FINDINGS: Comment: Study is limited for detection  and characterization of visceral and/or vascular lesions by lack of IV gadolinium. Lower chest: Trace right pleural effusion. Complex multilocular left pleural effusion with probable atelectasis  in the left lower lobe. Hepatobiliary: Mild diffuse loss of signal intensity throughout the hepatic parenchyma on out of phase dual echo images, indicative of mild hepatic steatosis. No discrete cystic or solid hepatic lesions are confidently identified on today's noncontrast examination. Gallbladder is normal in appearance. No intra or extrahepatic biliary ductal dilatation noted on MRCP images. Common bile duct is normal in caliber measuring 5 mm in the porta hepatis. No filling defects in the common bile duct to suggest choledocholithiasis. Pancreas: No definite pancreatic mass identified on today's noncontrast examination. No pancreatic ductal dilatation noted on MRCP images. No pancreatic or peripancreatic fluid collections or inflammatory changes. Spleen:  Unremarkable. Adrenals/Urinary Tract: Unenhanced appearance of the kidneys and bilateral adrenal glands is unremarkable. No hydroureteronephrosis in the visualized portions of the abdomen. Stomach/Bowel: Visualized portions are unremarkable. Vascular/Lymphatic: Aortic atherosclerosis without definite aneurysm in the abdomen on today's noncontrast examination. No lymphadenopathy noted in the abdomen. Other: No significant volume of ascites in the visualized portions of the peritoneal cavity. Trace amount of perinephric fluid bilaterally (nonspecific). Musculoskeletal: No aggressive appearing osseous lesions are noted in the visualized portions of the skeleton. IMPRESSION: 1. No findings to suggest biliary tract obstruction. No choledocholithiasis. 2. Mild hepatic steatosis. 3. Trace right pleural effusion and large complex multilocular left pleural effusion with probable atelectasis in the left lower lobe. 4. Aortic atherosclerosis. Electronically Signed   By: Vinnie Langton M.D.   On: 11/12/2018 10:26   Mr 3d Recon At Scanner  Result Date: 11/12/2018 CLINICAL DATA:  63 year old male with history of abnormal liver function tests. Cholelithiasis. Right  upper quadrant abdominal pain. EXAM: MRI ABDOMEN WITHOUT CONTRAST  (INCLUDING MRCP) TECHNIQUE: Multiplanar multisequence MR imaging of the abdomen was performed. Heavily T2-weighted images of the biliary and pancreatic ducts were obtained, and three-dimensional MRCP images were rendered by post processing. COMPARISON:  No prior abdominal MRI. CT the abdomen and pelvis 11/11/2018. FINDINGS: Comment: Study is limited for detection and characterization of visceral and/or vascular lesions by lack of IV gadolinium. Lower chest: Trace right pleural effusion. Complex multilocular left pleural effusion with probable atelectasis in the left lower lobe. Hepatobiliary: Mild diffuse loss of signal intensity throughout the hepatic parenchyma on out of phase dual echo images, indicative of mild hepatic steatosis. No discrete cystic or solid hepatic lesions are confidently identified on today's noncontrast examination. Gallbladder is normal in appearance. No intra or extrahepatic biliary ductal dilatation noted on MRCP images. Common bile duct is normal in caliber measuring 5 mm in the porta hepatis. No filling defects in the common bile duct to suggest choledocholithiasis. Pancreas: No definite pancreatic mass identified on today's noncontrast examination. No pancreatic ductal dilatation noted on MRCP images. No pancreatic or peripancreatic fluid collections or inflammatory changes. Spleen:  Unremarkable. Adrenals/Urinary Tract: Unenhanced appearance of the kidneys and bilateral adrenal glands is unremarkable. No hydroureteronephrosis in the visualized portions of the abdomen. Stomach/Bowel: Visualized portions are unremarkable. Vascular/Lymphatic: Aortic atherosclerosis without definite aneurysm in the abdomen on today's noncontrast examination. No lymphadenopathy noted in the abdomen. Other: No significant volume of ascites in the visualized portions of the peritoneal cavity. Trace amount of perinephric fluid bilaterally  (nonspecific). Musculoskeletal: No aggressive appearing osseous lesions are noted in the visualized portions of the skeleton. IMPRESSION: 1. No findings to suggest biliary tract obstruction. No choledocholithiasis. 2. Mild hepatic steatosis. 3. Trace  right pleural effusion and large complex multilocular left pleural effusion with probable atelectasis in the left lower lobe. 4. Aortic atherosclerosis. Electronically Signed   By: Trudie Reed M.D.   On: 11/12/2018 10:26   Ct Image Guided Drainage By Percutaneous Catheter  Result Date: 11/12/2018 INDICATION: 63 year old with left chest empyema. EXAM: CT-GUIDED PLACEMENT OF LEFT CHEST TUBE MEDICATIONS: Moderate sedation ANESTHESIA/SEDATION: 2.0 mg IV Versed 100 mcg IV Fentanyl Moderate Sedation Time:  15 minutes The patient was continuously monitored during the procedure by the interventional radiology nurse under my direct supervision. COMPLICATIONS: None immediate. TECHNIQUE: Informed written consent was obtained from the patient after a thorough discussion of the procedural risks, benefits and alternatives. All questions were addressed. A timeout was performed prior to the initiation of the procedure. PROCEDURE: Patient was placed on his right side. Images through the chest were obtained. Left side of the chest was prepped with chlorhexidine and sterile field was created. Skin and soft tissues were anesthetized with 1% lidocaine. Using CT guidance, a Yueh catheter was directed into the pleural space and cloudy yellow fluid was aspirated. Stiff Amplatz wire was advanced into the pleural space. The tract was dilated to accommodate a 14 [place] multipurpose drain. Additional cloudy yellow fluid was collected and sent for culture. Catheter was sutured to skin and attached to a PleurEvac collection device. Dressing was placed over the chest tube. FINDINGS: Complex left pleural effusion containing pockets of gas. Consolidation and volume loss in left lung. Cloudy  yellow fluid was removed from the left pleural space. 14 French catheter positioned within the left pleural space. IMPRESSION: CT-guided placement of a left chest tube. Electronically Signed   By: Richarda Overlie M.D.   On: 11/12/2018 16:26   US Abdomen Limited Ruq  Result Date: 11/11/2018 CLINICAL DATA:  Pain EXAM: ULTRASOUND ABDOMEN LIMITED RIGHT UPPER QUADRANT COMPARISON:  None. FINDINGS: Gallbladder: Multiple gallstones are noted. There is gallbladder wall thickening with the gallbladder wall measuring approximately 4 mm. The sonographic Eulah Pont sign is negative. There is no pericholecystic free fluid. Common bile duct: Diameter: 4 mm Liver: No focal lesion identified. Within normal limits in parenchymal echogenicity. Portal vein is patent on color Doppler imaging with normal direction of blood flow towards the liver. Other: None. IMPRESSION: Cholelithiasis with mild gallbladder wall thickening. In the absence of a positive sonographic Murphy sign, these findings are equivocal for acute cholecystitis. If there is high clinical suspicion for acute cholecystitis, follow-up with HIDA scan is recommended. Electronically Signed   By: Katherine Mantle M.D.   On: 11/11/2018 20:28   Medications:  I have reviewed the patient's current medications. Prior to Admission:  Medications Prior to Admission  Medication Sig Dispense Refill Last Dose   acetaminophen-codeine (TYLENOL #3) 300-30 MG tablet Take 1 tablet by mouth every 6 (six) hours as needed for pain.   11/11/2018 at 1000   Scheduled:  aspirin EC  81 mg Oral Daily   folic acid  1 mg Oral Daily   multivitamin with minerals  1 tablet Oral Daily   nicotine  21 mg Transdermal Daily   thiamine  100 mg Oral Daily   Or   thiamine  100 mg Intravenous Daily   Continuous:  0.9 % NaCl with KCl 20 mEq / L 125 mL/hr at 11/13/18 1219   heparin 1,300 Units/hr (11/13/18 1424)   lactated ringers     piperacillin-tazobactam (ZOSYN)  IV 3.375 g  (11/13/18 1332)   YVO:[id number] **OR** LORazepam, morphine injection, nitroGLYCERIN, ondansetron **OR**  ondansetron (ZOFRAN) IV, oxyCODONE Anti-infectives (From admission, onward)   Start     Dose/Rate Route Frequency Ordered Stop   11/13/18 1000  vancomycin (VANCOCIN) 1,250 mg in sodium chloride 0.9 % 250 mL IVPB  Status:  Discontinued     1,250 mg 166.7 mL/hr over 90 Minutes Intravenous Every 36 hours 11/12/18 0340 11/12/18 0759   11/12/18 0759  vancomycin variable dose per unstable renal function (pharmacist dosing)  Status:  Discontinued      Does not apply See admin instructions 11/12/18 0759 11/12/18 1501   11/12/18 0600  piperacillin-tazobactam (ZOSYN) IVPB 3.375 g     3.375 g 12.5 mL/hr over 240 Minutes Intravenous Every 8 hours 11/12/18 0332     11/11/18 2230  piperacillin-tazobactam (ZOSYN) IVPB 3.375 g     3.375 g 100 mL/hr over 30 Minutes Intravenous STAT 11/11/18 2205 11/11/18 2257   11/11/18 2000  vancomycin (VANCOCIN) 1,750 mg in sodium chloride 0.9 % 500 mL IVPB     1,750 mg 250 mL/hr over 120 Minutes Intravenous  Once 11/11/18 1944 11/12/18 0045   11/11/18 1945  ceFEPIme (MAXIPIME) 2 g in sodium chloride 0.9 % 100 mL IVPB     2 g 200 mL/hr over 30 Minutes Intravenous  Once 11/11/18 1938 11/11/18 2240   11/11/18 1945  metroNIDAZOLE (FLAGYL) IVPB 500 mg  Status:  Discontinued     500 mg 100 mL/hr over 60 Minutes Intravenous  Once 11/11/18 1938 11/12/18 0332   11/11/18 1945  vancomycin (VANCOCIN) IVPB 1000 mg/200 mL premix  Status:  Discontinued     1,000 mg 200 mL/hr over 60 Minutes Intravenous  Once 11/11/18 1938 11/11/18 1944     Scheduled Meds:  aspirin EC  81 mg Oral Daily   folic acid  1 mg Oral Daily   multivitamin with minerals  1 tablet Oral Daily   nicotine  21 mg Transdermal Daily   thiamine  100 mg Oral Daily   Or   thiamine  100 mg Intravenous Daily   Continuous Infusions:  0.9 % NaCl with KCl 20 mEq / L 125 mL/hr at 11/13/18 1219   heparin  1,300 Units/hr (11/13/18 1424)   lactated ringers     piperacillin-tazobactam (ZOSYN)  IV 3.375 g (11/13/18 1332)   PRN Meds:.LORazepam **OR** LORazepam, morphine injection, nitroGLYCERIN, ondansetron **OR** ondansetron (ZOFRAN) IV, oxyCODONE   Assessment: Principal Problem:   Empyema lung (HCC)  Acute hepatitis with no signs of liver failure, secondary to sepsis LFTs are improving Acute viral hepatitis panel negative, HIV nonreactive  Plan: Monitor LFTs daily Avoid hepatotoxic agents Adequate IV hydration Will hold off on further work-up at this time as LFTs are downtrending   LOS: 2 days   Agatha Duplechain 11/13/2018, 6:26 PM

## 2018-11-14 DIAGNOSIS — B179 Acute viral hepatitis, unspecified: Secondary | ICD-10-CM | POA: Diagnosis not present

## 2018-11-14 LAB — COMPREHENSIVE METABOLIC PANEL
ALT: 338 U/L — ABNORMAL HIGH (ref 0–44)
AST: 637 U/L — ABNORMAL HIGH (ref 15–41)
Albumin: 2 g/dL — ABNORMAL LOW (ref 3.5–5.0)
Alkaline Phosphatase: 160 U/L — ABNORMAL HIGH (ref 38–126)
Anion gap: 6 (ref 5–15)
BUN: 31 mg/dL — ABNORMAL HIGH (ref 8–23)
CO2: 24 mmol/L (ref 22–32)
Calcium: 8.1 mg/dL — ABNORMAL LOW (ref 8.9–10.3)
Chloride: 108 mmol/L (ref 98–111)
Creatinine, Ser: 1.3 mg/dL — ABNORMAL HIGH (ref 0.61–1.24)
GFR calc Af Amer: >60 mL/min (ref >60)
GFR calc non Af Amer: 58 mL/min — ABNORMAL LOW (ref >60)
Glucose, Bld: 102 mg/dL — ABNORMAL HIGH (ref 70–99)
Potassium: 3.8 mmol/L (ref 3.5–5.1)
Sodium: 138 mmol/L (ref 135–145)
Total Bilirubin: 1.3 mg/dL — ABNORMAL HIGH (ref 0.3–1.2)
Total Protein: 5.3 g/dL — ABNORMAL LOW (ref 6.5–8.1)

## 2018-11-14 LAB — CBC
HCT: 32.6 % — ABNORMAL LOW (ref 39.0–52.0)
Hemoglobin: 11.2 g/dL — ABNORMAL LOW (ref 13.0–17.0)
MCH: 31.2 pg (ref 26.0–34.0)
MCHC: 34.4 g/dL (ref 30.0–36.0)
MCV: 90.8 fL (ref 80.0–100.0)
Platelets: 335 10*3/uL (ref 150–400)
RBC: 3.59 MIL/uL — ABNORMAL LOW (ref 4.22–5.81)
RDW: 13.8 % (ref 11.5–15.5)
WBC: 21.8 10*3/uL — ABNORMAL HIGH (ref 4.0–10.5)
nRBC: 0.2 % (ref 0.0–0.2)

## 2018-11-14 LAB — TRIGLYCERIDES, BODY FLUIDS: Triglycerides, Fluid: 64 mg/dL

## 2018-11-14 LAB — LACTIC ACID, PLASMA: Lactic Acid, Venous: 1.5 mmol/L (ref 0.5–1.9)

## 2018-11-14 LAB — PROTEIN, BODY FLUID (OTHER): Total Protein, Body Fluid Other: 3.4 g/dL

## 2018-11-14 LAB — HEPARIN LEVEL (UNFRACTIONATED): Heparin Unfractionated: 0.18 IU/mL — ABNORMAL LOW (ref 0.30–0.70)

## 2018-11-14 MED ORDER — ENOXAPARIN SODIUM 40 MG/0.4ML ~~LOC~~ SOLN: 40.0000 mg | SUBCUTANEOUS | Status: DC

## 2018-11-14 MED ORDER — SODIUM CHLORIDE (PF) 0.9 % IJ SOLN
Freq: Once | INTRAMUSCULAR | Status: AC
  Administered 2018-11-14: 12:00:00 via INTRAPLEURAL
  Filled 2018-11-14: qty 10

## 2018-11-14 MED ORDER — ENSURE ENLIVE PO LIQD
237.0000 mL | Freq: Two times a day (BID) | ORAL | Status: DC
  Administered 2018-11-15 – 2018-11-24 (×9): 237 mL via ORAL

## 2018-11-14 MED ORDER — HEPARIN BOLUS VIA INFUSION
2000.0000 [IU] | INTRAVENOUS | Status: AC
  Administered 2018-11-14: 2000 [IU] via INTRAVENOUS
  Filled 2018-11-14: qty 2000

## 2018-11-14 MED ORDER — SODIUM CHLORIDE 0.9 % IV SOLN
2.0000 g | Freq: Two times a day (BID) | INTRAVENOUS | Status: DC
  Administered 2018-11-14 – 2018-11-15 (×3): 2 g via INTRAVENOUS
  Filled 2018-11-14 (×4): qty 2

## 2018-11-14 NOTE — Progress Notes (Signed)
daughter

## 2018-11-14 NOTE — Consult Note (Signed)
Pharmacy Antibiotic Note  Thomas Coffey is a 63 y.o. male admitted on 11/11/2018 with pneumonia and empyema.  Pharmacy has been consulted for cefepime dosing.  Empyema: drain left pleural effusion 10/28 then possibly intrapleural thrombolytics. Lactic acid 5.1 WBC 20.9> 20.6 > 21.8, afeb. Day 4 of abx.   Plan: Cefepime 2g q12H. If CrCl > 60 change frequency to q8H.   Height: 5\' 9"  (175.3 cm) Weight: 166 lb (75.3 kg)(pt refused to stand) IBW/kg (Calculated) : 70.7  Temp (24hrs), Avg:97.9 F (36.6 C), Min:97.6 F (36.4 C), Max:98.1 F (36.7 C)  Recent Labs  Lab 11/11/18 1836 11/11/18 1938 11/11/18 2258 11/12/18 0501 11/12/18 0816 11/12/18 1635 11/13/18 0455 11/13/18 0947 11/14/18 0520  WBC 24.7*  --   --  20.9*  --  21.3* 20.6*  --  21.8*  CREATININE 2.04*  --   --  2.25*  --   --  2.06*  --  1.30*  LATICACIDVEN  --  6.7* 5.1*  --  2.9* 2.2*  --  2.3*  --     Estimated Creatinine Clearance: 58.2 mL/min (A) (by C-G formula based on SCr of 1.3 mg/dL (H)).    No Known Allergies  Antimicrobials this admission: 10/26 pip/tazp >> 10/29 10/29 cefepime >>   Dose adjustments this admission: None  Microbiology results: 10/26 BCx: pending 10/27 MRSA PCR: negative 10/27 Pleural Fluid Cx: pending   Thank you for allowing pharmacy to be a part of this patient's care.  Oswald Hillock, PharmD, BCPS 11/14/2018 7:56 AM

## 2018-11-14 NOTE — Progress Notes (Signed)
   Patient ID: Thomas Coffey, male   DOB: November 10, 1955, 63 y.o.   MRN: [id number]  HISTORY: He seems somewhat confused this morning and agitated.  It was difficult to understand exactly his questions.  He did not appear to be short of breath or complaining of any significant chest pain or shortness of breath.   Vitals:   11/13/18 1520 11/14/18 0724  BP: 100/65 97/60  Pulse: 91 88  Resp: 19 20  Temp: 98.1 F (36.7 C) 98 F (36.7 C)  SpO2: 90% 93%     EXAM:    Resp: Lungs are clear bilaterally anteriorly..  No respiratory distress, normal effort. Heart:  Regular without murmurs Abd:  Abdomen is soft, non distended and non tender. No masses are palpable.  There is no rebound and no guarding.  Skin: Skin is warm and dry. No rash noted. No diaphoretic. No erythema. No pallor.     ASSESSMENT: I have reviewed the results of the pleural fluid analysis.  It is suggestive of an empyema.   PLAN:   I discussed his care today with Dr. Brett Albino.  We will instill intrapleural thrombolytics.  This was explained to the patient who gave his consent.    Thomas Lewandowsky, MD

## 2018-11-14 NOTE — Progress Notes (Addendum)
[facility] - [place] at [facility]   PATIENT NAME: Thomas Coffey    MR#:  [id number]  DATE OF BIRTH:  [birth year]  SUBJECTIVE:   Patient states he is feeling better today.  His left chest wall pain has overall improved.  He states he does not feel short of breath currently.  He has been able to get up to use the bathroom without any issues.  REVIEW OF SYSTEMS:  Review of Systems  Constitutional: Negative for chills and fever.  HENT: Negative for congestion and sore throat.   Eyes: Negative for blurred vision and double vision.  Respiratory: Positive for cough. Negative for shortness of breath.   Cardiovascular: Negative for chest pain, palpitations and leg swelling.  Gastrointestinal: Negative for abdominal pain, nausea and vomiting.  Genitourinary: Negative for dysuria and urgency.  Musculoskeletal: Negative for back pain and neck pain.  Neurological: Negative for dizziness and headaches.  Psychiatric/Behavioral: Negative for depression. The patient is not nervous/anxious.    DRUG ALLERGIES:  No Known Allergies VITALS:  Blood pressure 97/60, pulse 88, temperature 98 F (36.7 C), temperature source Oral, resp. rate 20, height 5\' 9"  (1.753 m), weight 75.3 kg, SpO2 93 %. PHYSICAL EXAMINATION:  Physical Exam  GENERAL:  Laying in the bed with no acute distress.  Thin appearing. HEENT: Head atraumatic, normocephalic. Pupils equal, round, reactive to light and accommodation. No scleral icterus. Extraocular muscles intact. Oropharynx and nasopharynx clear.  NECK:  Supple, no jugular venous distention. No thyroid enlargement. LUNGS: +diminished breath sounds in the left lung base, +chest tube in place over left posterior chest pain.  CARDIOVASCULAR: RRR, S1, S2 normal. No murmurs, rubs, or gallops.  ABDOMEN: Soft, nontender, nondistended. Bowel sounds present.  Negative Murphy sign. EXTREMITIES: No pedal edema, cyanosis, or clubbing.  NEUROLOGIC: CN 2-12 intact, no  focal deficits. 5/5 muscle strength throughout all extremities. Sensation intact throughout. Gait not checked.  PSYCHIATRIC: The patient is alert and oriented x 3.  SKIN: No obvious rash, lesion, or ulcer.  LABORATORY PANEL:  Male CBC Recent Labs  Lab 11/14/18 0520  WBC 21.8*  HGB 11.2*  HCT 32.6*  PLT 335   ------------------------------------------------------------------------------------------------------------------ Chemistries  Recent Labs  Lab 11/12/18 1635  11/14/18 0520  NA  --    < > 138  K  --    < > 3.8  CL  --    < > 108  CO2  --    < > 24  GLUCOSE  --    < > 102*  BUN  --    < > 31*  CREATININE  --    < > 1.30*  CALCIUM  --    < > 8.1*  MG 2.7*  --   --   AST 2,249*   < > 637*  ALT 572*   < > 338*  ALKPHOS 87   < > 160*  BILITOT 1.7*   < > 1.3*   < > = values in this interval not displayed.   RADIOLOGY:  No results found. ASSESSMENT AND PLAN:   Left sided empyema- meeting sepsis criteria on admission with tachycardia, leukocytosis, and lactic acidosis, all of which are improving. -s/p CT-guided left chest tube placement 10/27 -Cardiothoracic surgery following- plan for intrapleural thrombolytics today and tomorrow -Body fluid cultures with no growth to date -Switch antibiotics to cefepime -Blood cultures with no growth -Stop IVFs today  Acute hepatitis- may be due to ischemic hepatitis in the setting of sepsis vs alcoholic  hepatitis. Improving. -RUQ ultrasound was suggestive of cholelithiasis and showed some possible gallbladder wall thickening, but doubt cholecystitis without any right upper quadrant pain -MRCP performed 10/27 with no evidence of biliary tract obstruction -Acute hepatitis panel was negative -Avoid hepatotoxins  AKI- likely due to sepsis and dehydration in the setting of vomiting.  Creatinine continues to improve. -Stop IVFs -Renal ultrasound with medical renal disease -Avoid nephrotoxic agents  Elevated troponin- likely demand  ischemia in the setting of sepsis.  Patient denies any active cardiac chest pain. -Monitor  A. fib with RVR- patient had one episode on admission, likely precipitated by pneumonia. He has been in NSR since then. CHADSVASc is a 0. -Will stop heparin gtt with no plans to place patient on long term anticoagulation -ECHO with EF 60-65%, mild LVH -Cardiac monitoring  Alcohol abuse- drinks 4 beers and 1/2 pint of liquor per day. -CIWA  Tobacco use -Nicotine patch daily  Son, Thomas Coffey, was at bedside and was updated on the plan.  All the records are reviewed and case discussed with Care Management/Social Worker. Management plans discussed with the patient, family and they are in agreement.  CODE STATUS: Full Code  TOTAL TIME TAKING CARE OF THIS PATIENT: 40 minutes.   More than 50% of the time was spent in counseling/coordination of care: YES  POSSIBLE D/C IN 2-3 DAYS, DEPENDING ON CLINICAL CONDITION.   Berna Spare Frona Yost M.D on 11/14/2018 at 2:26 PM  Between 7am to 6pm - Pager [phone]  After 6pm go to [email or web address] - Proofreader  [facility]  Hospitalists  Office  [phone]  CC: Primary care physician; Patient, [name]  Note: This dictation was prepared with Dragon dictation along with smaller phrase technology. Any transcriptional errors that result from this process are unintentional.

## 2018-11-14 NOTE — Progress Notes (Signed)
Thomas Darby, MD [address]  [place]  [place], [place] [address]  Main: [phone]  Fax: [phone] Pager: [phone]   Subjective: He reports that his appetite is coming back.  He denies abdominal pain, nausea or vomiting.  Has been hemodynamically stable.  Persistent leukocytosis   Objective: Vital signs in last 24 hours: Vitals:   11/13/18 0404 11/13/18 0934 11/13/18 1520 11/14/18 0724  BP: 97/63 117/83 100/65 97/60  Pulse: 93 91 91 88  Resp:  18 19 20   Temp:  97.6 F (36.4 C) 98.1 F (36.7 C) 98 F (36.7 C)  TempSrc:  Oral Oral Oral  SpO2: (!) 87% 95% 90% 93%  Weight: 75.3 kg     Height:       Weight change:   Intake/Output Summary (Last 24 hours) at 11/14/2018 1742 Last data filed at 11/14/2018 1538 Gross per 24 hour  Intake 327.22 ml  Output 1210 ml  Net -882.78 ml     Exam: Heart:: Regular rate and rhythm, S1S2 present or without murmur or extra heart sounds Lungs: Decreased breath sounds in his left lung, chest tube in place Abdomen: soft, nontender, normal bowel sounds   Lab Results: CBC Latest Ref Rng & Units 11/14/2018 11/13/2018 11/12/2018  WBC 4.0 - 10.5 K/uL 21.8(H) 20.6(H) 21.3(H)  Hemoglobin 13.0 - 17.0 g/dL 11.2(L) 11.6(L) 11.4(L)  Hematocrit 39.0 - 52.0 % 32.6(L) 34.5(L) 34.2(L)  Platelets 150 - 400 K/uL 335 305 295   CMP Latest Ref Rng & Units 11/14/2018 11/13/2018 11/12/2018  Glucose 70 - 99 mg/dL 102(H) 120(H) -  BUN 8 - 23 mg/dL 31(H) 52(H) -  Creatinine 0.61 - 1.24 mg/dL 1.30(H) 2.06(H) -  Sodium 135 - 145 mmol/L 138 135 -  Potassium 3.5 - 5.1 mmol/L 3.8 3.5 -  Chloride 98 - 111 mmol/L 108 105 -  CO2 22 - 32 mmol/L 24 22 -  Calcium 8.9 - 10.3 mg/dL 8.1(L) 8.0(L) -  Total Protein 6.5 - 8.1 g/dL 5.3(L) 5.2(L) 5.0(L)  Total Bilirubin 0.3 - 1.2 mg/dL 1.3(H) 1.8(H) 1.7(H)  Alkaline Phos 38 - 126 U/L 160(H) 100 87  AST 15 - 41 U/L 637(H) 1,488(H) 2,249(H)  ALT 0 - 44 U/L 338(H) 517(H) 572(H)    Micro Results:  Recent Results (from the past 240 hour(s))  Blood Culture (routine x 2)     Status: None (Preliminary result)   Collection Time: 11/11/18  6:15 PM   Specimen: BLOOD  Result Value Ref Range Status   Specimen Description BLOOD BLOOD LEFT WRIST  Final   Special Requests   Final    BOTTLES DRAWN AEROBIC AND ANAEROBIC Blood Culture adequate volume   Culture   Final    NO GROWTH 3 DAYS Performed at [facility], [address], [place], [place] [address]    Report Status PENDING  Incomplete  SARS CORONAVIRUS 2 (TAT 6-24 HRS) Nasopharyngeal Nasopharyngeal Swab     Status: None   Collection Time: 11/11/18 10:41 PM   Specimen: Nasopharyngeal Swab  Result Value Ref Range Status   SARS Coronavirus 2 NEGATIVE NEGATIVE Final    Comment: (NOTE) SARS-CoV-2 target nucleic acids are NOT DETECTED. The SARS-CoV-2 RNA is generally detectable in upper and lower respiratory specimens during the acute phase of infection. Negative results do not preclude SARS-CoV-2 infection, do not rule out co-infections with other pathogens, and should not be used as the sole basis for treatment or other patient management decisions. Negative results must be combined with  clinical observations, patient history, and epidemiological information. The expected result is Negative. Fact Sheet for Patients: HairSlick.no Fact Sheet for Healthcare Providers: quierodirigir.com This test is not yet approved or cleared by the [place] FDA and  has been authorized for detection and/or diagnosis of SARS-CoV-2 by FDA under an Emergency Use Authorization (EUA). This EUA will remain  in effect (meaning this test can be used) for the duration of the COVID-19 declaration under Section 56 4(b)(1) of the Act, 21 U.S.C. section 360bbb-3(b)(1), unless the authorization is terminated or revoked sooner. Performed at [facility], [address] [place], [place]  [address]   Blood Culture (routine x 2)     Status: None (Preliminary result)   Collection Time: 11/11/18 11:45 PM   Specimen: BLOOD  Result Value Ref Range Status   Specimen Description BLOOD BLOOD LEFT WRIST  Final   Special Requests   Final    BOTTLES DRAWN AEROBIC AND ANAEROBIC Blood Culture adequate volume   Culture   Final    NO GROWTH 3 DAYS Performed at [facility], [address] [place], [place] [address]    Report Status PENDING  Incomplete  MRSA PCR Screening     Status: None   Collection Time: 11/12/18  7:50 AM   Specimen: Nasal Mucosa; Nasopharyngeal  Result Value Ref Range Status   MRSA by PCR NEGATIVE NEGATIVE Final    Comment:        The GeneXpert MRSA Assay (FDA approved for NASAL specimens only), is one component of a comprehensive MRSA colonization surveillance program. It is not intended to diagnose MRSA infection nor to guide or monitor treatment for MRSA infections. Performed at [facility], [address] [place], [place] [address]   Aerobic/Anaerobic Culture (surgical/deep wound)     Status: None (Preliminary result)   Collection Time: 11/12/18  5:08 PM   Specimen: Pleural Fluid  Result Value Ref Range Status   Specimen Description   Final    PLEURAL LEFT PLEURAL FLUID Performed at [facility], [address] [place], [place] [address]    Special Requests   Final    NONE Performed at [facility], [address] [place], [place] [address]    Gram Stain   Final    WBC PRESENT,BOTH PMN AND MONONUCLEAR NO ORGANISMS SEEN CYTOSPIN SMEAR    Culture   Final    NO GROWTH 2 DAYS NO ANAEROBES ISOLATED; CULTURE IN PROGRESS FOR 5 DAYS Performed at [facility], [address] [place], [place] [address]    Report Status PENDING  Incomplete   Studies/Results: Dg Chest 2 View  Result Date: 11/13/2018 CLINICAL DATA:  Status post left chest tube placement for drainage of empyema. EXAM: CHEST - 2 VIEW  COMPARISON:  11/11/2018 FINDINGS: The heart size and mediastinal contours are within normal limits. Interval placement of left-sided pleural pigtail catheter with improved aeration of the left lung. No pneumothorax identified. Bibasilar atelectasis present. The visualized skeletal structures are unremarkable. IMPRESSION: No pneumothorax identified after placement of left-sided pleural pigtail catheter. Bibasilar atelectasis present with improved aeration of the left lung. Electronically Signed   By: Irish Lack M.D.   On: 11/13/2018 08:59   US Renal  Result Date: 11/13/2018 CLINICAL DATA:  Acute kidney injury EXAM: RENAL / URINARY TRACT ULTRASOUND COMPLETE COMPARISON:  CT 11/11/2018 FINDINGS: Right Kidney: Renal measurements: 13.7 x 9.5 x 5.9 cm = volume: 231 mL. Mildly increased echotexture throughout the right  kidney. No mass or hydronephrosis. Left Kidney: Renal measurements: 12.8 x 5.9 x 5.1 cm = volume: 200 mL. Echogenicity within normal limits. No mass or hydronephrosis visualized. Bladder: Appears normal for degree of bladder distention. Other: IMPRESSION: Increased echotexture in the kidneys bilaterally suggesting medical renal disease. No hydronephrosis. Electronically Signed   By: Charlett Nose M.D.   On: 11/13/2018 09:07   Medications:  I have reviewed the patient's current medications. Prior to Admission:  Medications Prior to Admission  Medication Sig Dispense Refill Last Dose  . [EXPIRED] acetaminophen-codeine (TYLENOL #3) 300-30 MG tablet Take 1 tablet by mouth every 6 (six) hours as needed for pain.   11/11/2018 at 1000   Scheduled: . aspirin EC  81 mg Oral Daily  . [START ON 11/15/2018] feeding supplement (ENSURE ENLIVE)  237 mL Oral BID BM  . folic acid  1 mg Oral Daily  . multivitamin with minerals  1 tablet Oral Daily  . nicotine  21 mg Transdermal Daily  . thiamine  100 mg Oral Daily   Or  . thiamine  100 mg Intravenous Daily   Continuous: . ceFEPime (MAXIPIME) IV 2 g  (11/14/18 1233)   OHY:[id number] **OR** LORazepam, morphine injection, nitroGLYCERIN, ondansetron **OR** ondansetron (ZOFRAN) IV, oxyCODONE Anti-infectives (From admission, onward)   Start     Dose/Rate Route Frequency Ordered Stop   11/14/18 1200  ceFEPIme (MAXIPIME) 2 g in sodium chloride 0.9 % 100 mL IVPB     2 g 200 mL/hr over 30 Minutes Intravenous Every 12 hours 11/14/18 0801     11/13/18 1000  vancomycin (VANCOCIN) 1,250 mg in sodium chloride 0.9 % 250 mL IVPB  Status:  Discontinued     1,250 mg 166.7 mL/hr over 90 Minutes Intravenous Every 36 hours 11/12/18 0340 11/12/18 0759   11/12/18 0759  vancomycin variable dose per unstable renal function (pharmacist dosing)  Status:  Discontinued      Does not apply See admin instructions 11/12/18 0759 11/12/18 1501   11/12/18 0600  piperacillin-tazobactam (ZOSYN) IVPB 3.375 g  Status:  Discontinued     3.375 g 12.5 mL/hr over 240 Minutes Intravenous Every 8 hours 11/12/18 0332 11/14/18 0740   11/11/18 2230  piperacillin-tazobactam (ZOSYN) IVPB 3.375 g     3.375 g 100 mL/hr over 30 Minutes Intravenous STAT 11/11/18 2205 11/11/18 2257   11/11/18 2000  vancomycin (VANCOCIN) 1,750 mg in sodium chloride 0.9 % 500 mL IVPB     1,750 mg 250 mL/hr over 120 Minutes Intravenous  Once 11/11/18 1944 11/12/18 0045   11/11/18 1945  ceFEPIme (MAXIPIME) 2 g in sodium chloride 0.9 % 100 mL IVPB     2 g 200 mL/hr over 30 Minutes Intravenous  Once 11/11/18 1938 11/11/18 2240   11/11/18 1945  metroNIDAZOLE (FLAGYL) IVPB 500 mg  Status:  Discontinued     500 mg 100 mL/hr over 60 Minutes Intravenous  Once 11/11/18 1938 11/12/18 0332   11/11/18 1945  vancomycin (VANCOCIN) IVPB 1000 mg/200 mL premix  Status:  Discontinued     1,000 mg 200 mL/hr over 60 Minutes Intravenous  Once 11/11/18 1938 11/11/18 1944     Scheduled Meds: . aspirin EC  81 mg Oral Daily  . [START ON 11/15/2018] feeding supplement (ENSURE ENLIVE)  237 mL Oral BID BM  . folic acid  1 mg  Oral Daily  . multivitamin with minerals  1 tablet Oral Daily  . nicotine  21 mg Transdermal Daily  . thiamine  100 mg Oral  Daily   Or  . thiamine  100 mg Intravenous Daily   Continuous Infusions: . ceFEPime (MAXIPIME) IV 2 g (11/14/18 1233)   PRN Meds:.LORazepam **OR** LORazepam, morphine injection, nitroGLYCERIN, ondansetron **OR** ondansetron (ZOFRAN) IV, oxyCODONE   Assessment: Principal Problem:   Empyema lung (HCC)  Acute hepatitis with no signs of liver failure, secondary to sepsis LFTs are improving Acute viral hepatitis panel negative, HIV nonreactive Acute kidney injury improved  Plan: Monitor LFTs every other day Avoid hepatotoxic agents Encourage hydration Will hold off on further work-up at this time as LFTs have significantly improved  GI will sign off at this time, please call us back with questions or concerns   LOS: 3 days     11/14/2018, 5:42 PM

## 2018-11-14 NOTE — Progress Notes (Signed)
ANTICOAGULATION CONSULT NOTE - Follow up [place] for Heparin Indication: chest pain/ACS  No Known Allergies  Patient Measurements: Height: 5\' 9"  (175.3 cm) Weight: 166 lb (75.3 kg)(pt refused to stand) IBW/kg (Calculated) : 70.7 HEPARIN DW (KG): 72  Vital Signs: Temp: 98.1 F (36.7 C) (10/28 1520) Temp Source: Oral (10/28 1520) BP: 100/65 (10/28 1520) Pulse Rate: 91 (10/28 1520)  Labs: Recent Labs    11/11/18 1836 11/11/18 1938 11/11/18 2345 11/12/18 0501 11/12/18 0816  11/12/18 1635  11/13/18 0455 11/13/18 0947 11/13/18 1712 11/14/18 0053  HGB 16.1  --   --  12.1*  --   --  11.4*  --  11.6*  --   --   --   HCT 49.1  --   --  34.7*  --   --  34.2*  --  34.5*  --   --   --   PLT 363  --   --  285  --   --  295  --  305  --   --   --   APTT  --  39*  --   --   --   --   --   --  73*  --   --   --   LABPROT  --  26.3*  --   --   --   --  21.7*  --   --   --   --   --   INR  --  2.5*  --   --   --   --  1.9*  --   --   --   --   --   HEPARINUNFRC  --   --   --   --   --    < >  --    < >  --  0.10* 0.11* 0.18*  CREATININE 2.04*  --   --  2.25*  --   --   --   --  2.06*  --   --   --   TROPONINIHS 41*  --  439*  --  425*  --   --   --   --   --   --   --    < > = values in this interval not displayed.    Estimated Creatinine Clearance: 36.7 mL/min (A) (by C-G formula based on SCr of 2.06 mg/dL (H)).  Assessment: Pharmacy asked to initiate and monitor Heparin for elevated troponin.  Pt not on any anticoagulants PTA per med list.  INR and aPTT is elevated on admission.  Heparin infusion started at 850 units/hr, no bolus due to elevated INR 10/28 @0947  HL 0.1  10/28 @ 1712 HL 0.11. Heparin level is subtherapeutic s/p bolus and rate increase. Confirmed with floor nurse heparin had not been interrupted.  10/28 @ 0053 HL 0.18, subtherapeutic despite bolus and rate increase.  Goal of Therapy:  Heparin level 0.3-0.7 units/ml Monitor platelets by  anticoagulation protocol: Yes   Plan:  Will give 2000 unit bolus and increase infusion to 1700 units/hr.  Will check heparin level 6 hours after rate change. CBC stable.   Pharmacy will continue to follow.   Ena Dawley, PharmD 11/14/2018,1:27 AM

## 2018-11-14 NOTE — Progress Notes (Signed)
Change patient's chest tube container for accurate drainage measurement because it seems like the container was trip over before I came in today. Drainage output was charted in the output. Educated patient to be careful when handling the chest tube to not trip it over for [place] to get an accurate measurement. Chest tube in -20 cont. Suction. RN will continue to monitor.

## 2018-11-15 ENCOUNTER — Inpatient Hospital Stay: Payer: Medicaid Other

## 2018-11-15 LAB — CBC
HCT: 32.8 % — ABNORMAL LOW (ref 39.0–52.0)
Hemoglobin: 11 g/dL — ABNORMAL LOW (ref 13.0–17.0)
MCH: 31.2 pg (ref 26.0–34.0)
MCHC: 33.5 g/dL (ref 30.0–36.0)
MCV: 92.9 fL (ref 80.0–100.0)
Platelets: 357 10*3/uL (ref 150–400)
RBC: 3.53 MIL/uL — ABNORMAL LOW (ref 4.22–5.81)
RDW: 14.5 % (ref 11.5–15.5)
WBC: 18.2 10*3/uL — ABNORMAL HIGH (ref 4.0–10.5)
nRBC: 0 % (ref 0.0–0.2)

## 2018-11-15 LAB — COMPREHENSIVE METABOLIC PANEL
ALT: 210 U/L — ABNORMAL HIGH (ref 0–44)
AST: 200 U/L — ABNORMAL HIGH (ref 15–41)
Albumin: 2.1 g/dL — ABNORMAL LOW (ref 3.5–5.0)
Alkaline Phosphatase: 160 U/L — ABNORMAL HIGH (ref 38–126)
Anion gap: 8 (ref 5–15)
BUN: 13 mg/dL (ref 8–23)
CO2: 23 mmol/L (ref 22–32)
Calcium: 8.4 mg/dL — ABNORMAL LOW (ref 8.9–10.3)
Chloride: 106 mmol/L (ref 98–111)
Creatinine, Ser: 0.95 mg/dL (ref 0.61–1.24)
GFR calc Af Amer: >60 mL/min (ref >60)
GFR calc non Af Amer: >60 mL/min (ref >60)
Glucose, Bld: 143 mg/dL — ABNORMAL HIGH (ref 70–99)
Potassium: 3.4 mmol/L — ABNORMAL LOW (ref 3.5–5.1)
Sodium: 137 mmol/L (ref 135–145)
Total Bilirubin: 0.9 mg/dL (ref 0.3–1.2)
Total Protein: 5.5 g/dL — ABNORMAL LOW (ref 6.5–8.1)

## 2018-11-15 LAB — CYTOLOGY - NON PAP

## 2018-11-15 MED ORDER — POTASSIUM CHLORIDE CRYS ER 20 MEQ PO TBCR
40.0000 meq | EXTENDED_RELEASE_TABLET | Freq: Once | ORAL | Status: AC
  Administered 2018-11-15: 40 meq via ORAL
  Filled 2018-11-15: qty 2

## 2018-11-15 MED ORDER — SODIUM CHLORIDE 0.9 % IV SOLN
2.0000 g | Freq: Three times a day (TID) | INTRAVENOUS | Status: DC
  Administered 2018-11-15 – 2018-11-20 (×12): 2 g via INTRAVENOUS
  Filled 2018-11-15 (×17): qty 2

## 2018-11-15 MED ORDER — SODIUM CHLORIDE (PF) 0.9 % IJ SOLN
Freq: Once | INTRAMUSCULAR | Status: DC
  Filled 2018-11-15: qty 10

## 2018-11-15 NOTE — Progress Notes (Signed)
  Patient ID: Thomas Coffey, male   DOB: 03/25/1955, 63 y.o.   MRN: [id number]  HISTORY: He says he feels better today but still has a productive cough.  Not short of breath and no chest pain.   Vitals:   11/15/18 0534 11/15/18 0745  BP: 111/66 112/70  Pulse: 96 97  Resp: 20 19  Temp: 98.6 F (37 C) 98.5 F (36.9 C)  SpO2: (!) 83% 95%     EXAM:    Resp: Lungs are clear on the right but decreased on the left.  No respiratory distress, normal effort. Heart:  Regular without murmurs Abd:  Abdomen is soft, non distended and non tender. No masses are palpable.  There is no rebound and no guarding.  Neurological: Alert and oriented to person, place, and time. Coordination normal.  Skin: Skin is warm and dry. No rash noted. No diaphoretic. No erythema. No pallor.  Psychiatric: Normal mood and affect. Normal behavior. Judgment and thought content normal.    ASSESSMENT: Left sided empyema   PLAN:   Today, I placed 10 mg of intrapleural TPA at 1400 hours.  The catheter was unobstructed and the fluid was easy to administer.  Will leave chest tube clamped for 4 hours and then place back to suction.    Nestor Lewandowsky, MD

## 2018-11-15 NOTE — Progress Notes (Signed)
[name] at this time, as ordered. Will continue to monitor. Thomas Coffey [facility]

## 2018-11-15 NOTE — Consult Note (Addendum)
Pharmacy Antibiotic Note  Thomas Coffey is a 63 y.o. male admitted on 11/11/2018 with pneumonia and empyema.  Pharmacy has been consulted for cefepime dosing.  Empyema: drain left pleural effusion 10/28 then possibly intrapleural thrombolytics. Lactic acid 5.1 WBC 20.9> 20.6 > 21.8, afeb. Day 5 of abx.   Plan: Change to Cefepime 2g q8H. Renal function improved.   Height: 5\' 9"  (175.3 cm) Weight: 166 lb (75.3 kg)(pt refused to stand) IBW/kg (Calculated) : 70.7  Temp (24hrs), Avg:99.4 F (37.4 C), Min:98.5 F (36.9 C), Max:100.6 F (38.1 C)  Recent Labs  Lab 11/11/18 1836  11/11/18 2258 11/12/18 0501 11/12/18 0816 11/12/18 1635 11/13/18 0455 11/13/18 0947 11/14/18 0520 11/14/18 0755 11/15/18 0753  WBC 24.7*  --   --  20.9*  --  21.3* 20.6*  --  21.8*  --  18.2*  CREATININE 2.04*  --   --  2.25*  --   --  2.06*  --  1.30*  --  0.95  LATICACIDVEN  --    < > 5.1*  --  2.9* 2.2*  --  2.3*  --  1.5  --    < > = values in this interval not displayed.    Estimated Creatinine Clearance: 79.6 mL/min (by C-G formula based on SCr of 0.95 mg/dL).    No Known Allergies  Antimicrobials this admission: 10/26 pip/tazp >> 10/29 10/29 cefepime >>   Dose adjustments this admission: None  Microbiology results: 10/26 BCx: pending 10/27 MRSA PCR: negative 10/27 Pleural Fluid Cx: pending   Thank you for allowing pharmacy to be a part of this patient's care.  Oswald Hillock, PharmD, BCPS 11/15/2018 12:50 PM

## 2018-11-15 NOTE — Plan of Care (Signed)
  Problem: Education: Goal: Knowledge of General Education information will improve Description: Including pain rating scale, medication(s)/side effects and non-pharmacologic comfort measures Outcome: Progressing   Problem: Health Behavior/Discharge Planning: Goal: Ability to manage health-related needs will improve Outcome: Progressing   Problem: Clinical Measurements: Goal: Ability to maintain clinical measurements within normal limits will improve Outcome: Not Progressing Note: K+ = only 3.4 today. Replaced. Will continue to monitor lab values for the remainder of the shift. Wenda Low [facility]

## 2018-11-15 NOTE — Progress Notes (Signed)
[facility] [place] at [facility]   PATIENT NAME: Thomas Coffey    MR#:  [id number]  DATE OF BIRTH:  [birth year]  SUBJECTIVE:   Patient states he is feeling well this morning.  He endorses some mild chest pain, but states the pain medicines are helping.  He feels like his shortness of breath is improving.  He endorses cough.  REVIEW OF SYSTEMS:  Review of Systems  Constitutional: Negative for chills and fever.  HENT: Negative for congestion and sore throat.   Eyes: Negative for blurred vision and double vision.  Respiratory: Positive for cough. Negative for shortness of breath.   Cardiovascular: Negative for chest pain, palpitations and leg swelling.  Gastrointestinal: Negative for abdominal pain, nausea and vomiting.  Genitourinary: Negative for dysuria and urgency.  Musculoskeletal: Negative for back pain and neck pain.  Neurological: Negative for dizziness and headaches.  Psychiatric/Behavioral: Negative for depression. The patient is not nervous/anxious.    DRUG ALLERGIES:  No Known Allergies VITALS:  Blood pressure 112/70, pulse 97, temperature 98.5 F (36.9 C), temperature source Oral, resp. rate 19, height 5\' 9"  (1.753 m), weight 75.3 kg, SpO2 95 %. PHYSICAL EXAMINATION:  Physical Exam  GENERAL:  Laying in the bed with no acute distress.  Thin appearing. HEENT: Head atraumatic, normocephalic. Pupils equal, round, reactive to light and accommodation. No scleral icterus. Extraocular muscles intact. Oropharynx and nasopharynx clear.  NECK:  Supple, no jugular venous distention. No thyroid enlargement. LUNGS: +diminished breath sounds in the left lung, +chest tube in place over left posterior chest. CARDIOVASCULAR: RRR, S1, S2 normal. No murmurs, rubs, or gallops.  ABDOMEN: Soft, nontender, nondistended. Bowel sounds present.  Negative Murphy sign. EXTREMITIES: No pedal edema, cyanosis, or clubbing.  NEUROLOGIC: CN 2-12 intact, no focal deficits. 5/5  muscle strength throughout all extremities. Sensation intact throughout. Gait not checked.  PSYCHIATRIC: The patient is alert and oriented x 3.  SKIN: No obvious rash, lesion, or ulcer.  LABORATORY PANEL:  Male CBC Recent Labs  Lab 11/15/18 0753  WBC 18.2*  HGB 11.0*  HCT 32.8*  PLT 357   ------------------------------------------------------------------------------------------------------------------ Chemistries  Recent Labs  Lab 11/12/18 1635  11/15/18 0753  NA  --    < > 137  K  --    < > 3.4*  CL  --    < > 106  CO2  --    < > 23  GLUCOSE  --    < > 143*  BUN  --    < > 13  CREATININE  --    < > 0.95  CALCIUM  --    < > 8.4*  MG 2.7*  --   --   AST 2,249*   < > 200*  ALT 572*   < > 210*  ALKPHOS 87   < > 160*  BILITOT 1.7*   < > 0.9   < > = values in this interval not displayed.   RADIOLOGY:  Dg Chest Port 1 View  Result Date: 11/15/2018 CLINICAL DATA:  Chest tube in place EXAM: PORTABLE CHEST 1 VIEW COMPARISON:  11/13/2018 FINDINGS: Left-sided chest tube remains in position without significant pneumothorax. Slight interval increase in heterogeneous and interstitial opacity of the left lung. The right lung remains normally aerated. No focal airspace opacity. Heart and mediastinum are normal. IMPRESSION: Left-sided chest tube remains in position without significant pneumothorax. Slight interval increase in heterogeneous and interstitial opacity of the left lung. The right lung remains normally aerated.  No focal airspace opacity Electronically Signed   By: Eddie Candle M.D.   On: 11/15/2018 08:42   ASSESSMENT AND PLAN:   Left sided empyema- meeting sepsis criteria on admission with tachycardia, leukocytosis, and lactic acidosis, all of which are improving. -s/p CT-guided left chest tube placement 10/27 -Cardiothoracic surgery following-received intrapleural thrombolytics yesterday and today -Body fluid cultures with no growth to date -Continue cefepime -Blood  cultures with no growth  Acute hepatitis- may be due to ischemic hepatitis in the setting of sepsis vs alcoholic hepatitis. Improving. -RUQ ultrasound was suggestive of cholelithiasis and showed some possible gallbladder wall thickening, but doubt cholecystitis without any right upper quadrant pain -MRCP performed 10/27 with no evidence of biliary tract obstruction -Acute hepatitis panel was negative -Avoid hepatotoxins  Hypokalemia -Replete and recheck  Elevated troponin- likely demand ischemia in the setting of sepsis.  Patient denies any active cardiac chest pain. -Monitor  A. fib with RVR- patient had one episode on admission, likely precipitated by pneumonia. He has been in NSR since then. CHADSVASc is a 0. -No plans to place patient on long term anticoagulation -ECHO with EF 60-65%, mild LVH -Cardiac monitoring  Alcohol abuse- drinks 4 beers and 1/2 pint of liquor per day. -CIWA  Tobacco use -Nicotine patch daily  Son, [name], was at bedside and was updated on the plan.  All the records are reviewed and case discussed with Care Management/Social Worker. Management plans discussed with the patient, family and they are in agreement.  CODE STATUS: Full Code  TOTAL TIME TAKING CARE OF THIS PATIENT: 37 minutes.   More than 50% of the time was spent in counseling/coordination of care: YES  POSSIBLE D/C IN 2-3 DAYS, DEPENDING ON CLINICAL CONDITION.   Berna Spare Mayo M.D on 11/15/2018 at 12:10 PM  Between 7am to 6pm - Pager [phone]  After 6pm go to [email or web address] - Proofreader  [facility]  Office  [phone]  CC: Primary care physician; Patient, [name]  Note: This dictation was prepared with Dragon dictation along with smaller phrase technology. Any transcriptional errors that result from this process are unintentional.

## 2018-11-16 ENCOUNTER — Inpatient Hospital Stay: Payer: Medicaid Other

## 2018-11-16 LAB — CBC
HCT: 33.2 % — ABNORMAL LOW (ref 39.0–52.0)
Hemoglobin: 11.1 g/dL — ABNORMAL LOW (ref 13.0–17.0)
MCH: 30.8 pg (ref 26.0–34.0)
MCHC: 33.4 g/dL (ref 30.0–36.0)
MCV: 92.2 fL (ref 80.0–100.0)
Platelets: 414 10*3/uL — ABNORMAL HIGH (ref 150–400)
RBC: 3.6 MIL/uL — ABNORMAL LOW (ref 4.22–5.81)
RDW: 14.8 % (ref 11.5–15.5)
WBC: 18 10*3/uL — ABNORMAL HIGH (ref 4.0–10.5)
nRBC: 0 % (ref 0.0–0.2)

## 2018-11-16 LAB — CULTURE, BLOOD (ROUTINE X 2)
Culture: NO GROWTH
Culture: NO GROWTH
Special Requests: ADEQUATE
Special Requests: ADEQUATE

## 2018-11-16 LAB — COMPREHENSIVE METABOLIC PANEL
ALT: 139 U/L — ABNORMAL HIGH (ref 0–44)
AST: 84 U/L — ABNORMAL HIGH (ref 15–41)
Albumin: 2 g/dL — ABNORMAL LOW (ref 3.5–5.0)
Alkaline Phosphatase: 158 U/L — ABNORMAL HIGH (ref 38–126)
Anion gap: 8 (ref 5–15)
BUN: 8 mg/dL (ref 8–23)
CO2: 26 mmol/L (ref 22–32)
Calcium: 8.6 mg/dL — ABNORMAL LOW (ref 8.9–10.3)
Chloride: 105 mmol/L (ref 98–111)
Creatinine, Ser: 0.79 mg/dL (ref 0.61–1.24)
GFR calc Af Amer: >60 mL/min (ref >60)
GFR calc non Af Amer: >60 mL/min (ref >60)
Glucose, Bld: 110 mg/dL — ABNORMAL HIGH (ref 70–99)
Potassium: 3.7 mmol/L (ref 3.5–5.1)
Sodium: 139 mmol/L (ref 135–145)
Total Bilirubin: 1.1 mg/dL (ref 0.3–1.2)
Total Protein: 5.6 g/dL — ABNORMAL LOW (ref 6.5–8.1)

## 2018-11-16 MED ORDER — VANCOMYCIN HCL 1.5 G IV SOLR
1500.0000 mg | Freq: Once | INTRAVENOUS | Status: AC
  Administered 2018-11-16: 1500 mg via INTRAVENOUS
  Filled 2018-11-16: qty 1500

## 2018-11-16 MED ORDER — VANCOMYCIN HCL IN DEXTROSE 1-5 GM/200ML-% IV SOLN
1000.0000 mg | Freq: Two times a day (BID) | INTRAVENOUS | Status: DC
  Administered 2018-11-17: 1000 mg via INTRAVENOUS
  Filled 2018-11-16 (×3): qty 200

## 2018-11-16 MED ORDER — NAPHAZOLINE-GLYCERIN 0.012-0.2 % OP SOLN
1.0000 [drp] | Freq: Four times a day (QID) | OPHTHALMIC | Status: DC | PRN
  Filled 2018-11-16: qty 15

## 2018-11-16 MED ORDER — SODIUM CHLORIDE 0.9 % IV SOLN
INTRAVENOUS | Status: DC | PRN
  Administered 2018-11-16: 10 mL via INTRAVENOUS
  Administered 2018-11-17 – 2018-11-18 (×2): 250 mL via INTRAVENOUS

## 2018-11-16 MED ORDER — SODIUM CHLORIDE 0.9 % IV BOLUS
500.0000 mL | Freq: Once | INTRAVENOUS | Status: AC
  Administered 2018-11-16: 500 mL via INTRAVENOUS

## 2018-11-16 MED ORDER — SODIUM CHLORIDE 0.9 % IV SOLN
INTRAVENOUS | Status: DC
  Administered 2018-11-16 – 2018-11-22 (×9): via INTRAVENOUS

## 2018-11-16 MED ORDER — ACETAMINOPHEN 325 MG PO TABS
650.0000 mg | ORAL_TABLET | Freq: Four times a day (QID) | ORAL | Status: DC | PRN
  Administered 2018-11-16 – 2018-11-25 (×4): 650 mg via ORAL
  Filled 2018-11-16 (×4): qty 2

## 2018-11-16 MED ORDER — SODIUM CHLORIDE (PF) 0.9 % IJ SOLN
Freq: Once | INTRAMUSCULAR | Status: AC
  Administered 2018-11-16: 11:00:00 via INTRAPLEURAL
  Filled 2018-11-16: qty 10

## 2018-11-16 MED ORDER — MORPHINE SULFATE (PF) 4 MG/ML IV SOLN
4.0000 mg | INTRAVENOUS | Status: DC | PRN
  Administered 2018-11-16 – 2018-11-20 (×6): 4 mg via INTRAVENOUS
  Filled 2018-11-16 (×6): qty 1

## 2018-11-16 MED ORDER — POLYVINYL ALCOHOL 1.4 % OP SOLN
1.0000 [drp] | OPHTHALMIC | Status: DC | PRN
  Administered 2018-11-17: 1 [drp] via OPHTHALMIC
  Filled 2018-11-16: qty 15

## 2018-11-16 NOTE — Consult Note (Signed)
Pharmacy Antibiotic Note  Thomas Coffey is a 63 y.o. male admitted on 11/11/2018 with pneumonia and empyema.  Pharmacy has been consulted for cefepime dosing.  Empyema: drain left pleural effusion 10/28 then possibly intrapleural thrombolytics. Lactic acid 5.1 WBC 20.9> 20.6 > 21.8, afeb. Day 6 of abx.   10/31: Per hospitalist, patient continues to spike fevers and will treat empirically along with ordering repeat blood cultures. Will retreat with Vancomycin and continue Cefepime.  Plan: 1) Vancomycin 1g IV Q 12 hrs. Goal AUC 400-550. Expected AUC: 453.7 Expected Css: 12.0 SCr used: 0.8(0.79 actual)  2) Continue Cefepime 2g IV Q8 hours   Height: 5\' 9"  (175.3 cm) Weight: 163 lb (73.9 kg) IBW/kg (Calculated) : 70.7  Temp (24hrs), Avg:100.5 F (38.1 C), Min:98.6 F (37 C), Max:102 F (38.9 C)  Recent Labs  Lab 11/11/18 2258 11/12/18 0501 11/12/18 0816 11/12/18 1635 11/13/18 0455 11/13/18 0947 11/14/18 0520 11/14/18 0755 11/15/18 0753 11/16/18 0531  WBC  --  20.9*  --  21.3* 20.6*  --  21.8*  --  18.2* 18.0*  CREATININE  --  2.25*  --   --  2.06*  --  1.30*  --  0.95 0.79  LATICACIDVEN 5.1*  --  2.9* 2.2*  --  2.3*  --  1.5  --   --     Estimated Creatinine Clearance: 94.5 mL/min (by C-G formula based on SCr of 0.79 mg/dL).    No Known Allergies  Antimicrobials this admission: 10/26 pip/tazp >> 10/29 10/31 Vancomycin >> 10/29 cefepime >>   Dose adjustments this admission: None  Microbiology results: 10/31 repeat BCx: pending 10/26 BCx: NGF 10/27 MRSA PCR: negative 10/27 Pleural Fluid Cx: pending   Thank you for allowing pharmacy to be a part of this patient's care.  Pearla Dubonnet, PharmD 11/16/2018 6:40 PM

## 2018-11-16 NOTE — Progress Notes (Signed)
Unclamped chest tube

## 2018-11-16 NOTE — Progress Notes (Signed)
Notify Dr. Brett Albino about patient's frequent requesting for his morphine 4mg  IV almost every 3 hours, encourage patient to alternate IV morphine with oxycodone but still wants to have his IV morphine instead. Asked MD if we can either change the dose or change the frequency, order is change. RN will continue to monitor.

## 2018-11-16 NOTE — Progress Notes (Signed)
Spoke with RN regarding patient's status. He had a fever to 102F and is in sinus tachycardia with HR in the 120s. Will obtain repeat set of blood cultures. Will add back vancomycin. I ordered a 500 cc bolus and will restart IVFs. Continue to monitor closely.   Hyman Bible, MD

## 2018-11-16 NOTE — Progress Notes (Signed)
[facility] [place] at [facility]   PATIENT NAME: Thomas Coffey    MR#:  [id number]  DATE OF BIRTH:  03/08/1955  SUBJECTIVE:   Patient states he feels fine today.  His shortness of breath has improved.  He is having some significant left chest wall pain today at the site of the chest tube.  He did have a fever to 101F overnight.  He states this was because it was very hot in his room last night.  REVIEW OF SYSTEMS:  Review of Systems  Constitutional: Negative for chills and fever.  HENT: Negative for congestion and sore throat.   Eyes: Negative for blurred vision and double vision.  Respiratory: Positive for cough. Negative for shortness of breath.   Cardiovascular: Negative for chest pain, palpitations and leg swelling.  Gastrointestinal: Negative for abdominal pain, nausea and vomiting.  Genitourinary: Negative for dysuria and urgency.  Musculoskeletal: Negative for back pain and neck pain.  Neurological: Negative for dizziness and headaches.  Psychiatric/Behavioral: Negative for depression. The patient is not nervous/anxious.    DRUG ALLERGIES:  No Known Allergies VITALS:  Blood pressure 116/74, pulse (!) 113, temperature 100.2 F (37.9 C), temperature source Oral, resp. rate 20, height 5\' 9"  (1.753 m), weight 73.9 kg, SpO2 93 %. PHYSICAL EXAMINATION:  Physical Exam  GENERAL:  Laying in the bed with no acute distress.  Thin appearing. HEENT: Head atraumatic, normocephalic. Pupils equal, round, reactive to light and accommodation. No scleral icterus. Extraocular muscles intact. Oropharynx and nasopharynx clear.  NECK:  Supple, no jugular venous distention. No thyroid enlargement. LUNGS: +diminished breath sounds in the left lung base, +chest tube in place over left posterior chest. CARDIOVASCULAR: RRR, S1, S2 normal. No murmurs, rubs, or gallops.  ABDOMEN: Soft, nontender, nondistended. Bowel sounds present.  Negative Murphy sign. EXTREMITIES: No pedal  edema, cyanosis, or clubbing.  NEUROLOGIC: CN 2-12 intact, no focal deficits. 5/5 muscle strength throughout all extremities. Sensation intact throughout. Gait not checked.  PSYCHIATRIC: The patient is alert and oriented x 3.  SKIN: No obvious rash, lesion, or ulcer.  LABORATORY PANEL:  Male CBC Recent Labs  Lab 11/16/18 0531  WBC 18.0*  HGB 11.1*  HCT 33.2*  PLT 414*   ------------------------------------------------------------------------------------------------------------------ Chemistries  Recent Labs  Lab 11/12/18 1635  11/16/18 0531  NA  --    < > 139  K  --    < > 3.7  CL  --    < > 105  CO2  --    < > 26  GLUCOSE  --    < > 110*  BUN  --    < > 8  CREATININE  --    < > 0.79  CALCIUM  --    < > 8.6*  MG 2.7*  --   --   AST 2,249*   < > 84*  ALT 572*   < > 139*  ALKPHOS 87   < > 158*  BILITOT 1.7*   < > 1.1   < > = values in this interval not displayed.   RADIOLOGY:  Dg Chest 1 View  Result Date: 11/16/2018 CLINICAL DATA:  Follow-up for empyema.  Left-sided chest tube. EXAM: CHEST  1 VIEW COMPARISON:  11/15/2018 and earlier exams. FINDINGS: Pigtail chest tube projects over the left mid hemithorax, stable. There is hazy opacity from the left mid lung to the base, improved compared to the previous day's exam. Right lung is clear. No pneumothorax. IMPRESSION: 1. Improved  lung aeration since the previous day's exam. Left lung opacities have decreased. No new lung abnormalities. No pneumothorax. Electronically Signed   By: Lajean Manes M.D.   On: 11/16/2018 10:27   ASSESSMENT AND PLAN:   Left sided empyema- meeting sepsis criteria on admission with tachycardia, leukocytosis, and lactic acidosis, all of which are improving. -s/p CT-guided left chest tube placement 10/27 -Cardiothoracic surgery following- patient will receive his 3rd round of intrapleural thrombolytics today -Plan for repeat CT chest tomorrow -Body fluid cultures with no growth to date -Continue  cefepime -Blood cultures with no growth -Wean down pain meds today  Acute hepatitis- may be due to ischemic hepatitis in the setting of sepsis vs alcoholic hepatitis. Improving. -RUQ ultrasound was suggestive of cholelithiasis and showed some possible gallbladder wall thickening, but doubt cholecystitis without any right upper quadrant pain -MRCP performed 10/27 with no evidence of biliary tract obstruction -Acute hepatitis panel was negative -Avoid hepatotoxins  A. fib with RVR- patient had one episode on admission, likely precipitated by his pneumonia. He has been in NSR since then. CHADSVASc is a 0. -No plans to place patient on long term anticoagulation -ECHO with EF 60-65%, mild LVH -Cardiac monitoring  Alcohol abuse- drinks 4 beers and 1/2 pint of liquor per day. -Will stop CIWA, as patient has been asymptomatic for 5 days -MVI, thiamine, folate  Tobacco use -Nicotine patch daily  Son, [name], was updated via the phone.  All the records are reviewed and case discussed with Care Management/Social Worker. Management plans discussed with the patient, family and they are in agreement.  CODE STATUS: Full Code  TOTAL TIME TAKING CARE OF THIS PATIENT: 37 minutes.   More than 50% of the time was spent in counseling/coordination of care: YES  POSSIBLE D/C IN 2-3 DAYS, DEPENDING ON CLINICAL CONDITION.   Berna Spare Amaris Garrette M.D on 11/16/2018 at 1:27 PM  Between 7am to 6pm - Pager [phone]  After 6pm go to [email or web address] - Proofreader  [facility] [place]  Office  [phone]  CC: Primary care physician; Patient, [name]  Note: This dictation was prepared with Dragon dictation along with smaller phrase technology. Any transcriptional errors that result from this process are unintentional.

## 2018-11-16 NOTE — Progress Notes (Signed)
  Patient ID: Thomas Coffey, male   DOB: [birth year], 63 y.o.   MRN: [id number]  HISTORY: States he feels better today.  Not short of breath.   Vitals:   11/16/18 0634 11/16/18 0736  BP:  116/74  Pulse: (!) 111 (!) 113  Resp:  20  Temp:  100.2 F (37.9 C)  SpO2:  93%     EXAM:    Resp: Lungs are clear bilaterally though slightly decreased at left base.  No respiratory distress, normal effort. Heart:  Regular without murmurs Abd:  Abdomen is soft, non distended and non tender. No masses are palpable.  There is no rebound and no guarding.  Neurological: Alert and oriented to person, place, and time. Coordination normal.  Skin: Skin is warm and dry. No rash noted. No diaphoretic. No erythema. No pallor.  Psychiatric: Normal mood and affect. Normal behavior. Judgment and thought content normal.   Independent review of cxray shows improving left chest opacity without obvious pleural effusion  ASSESSMENT: Left empyema.  I stripped the tubes today and got out about 100 cc of purulent fluid   PLAN:   Will place TPA again today.  Will need ct scan of chest tomorrow to assess the effectiveness of the TPA   Nestor Lewandowsky, MD

## 2018-11-16 NOTE — Procedures (Signed)
10 mg of intrapleural TPA were administered through the patient's existing chest tube.  I verified on the syringe the contents of the syringe.  After the TPA was administered the tube was clamped.  We will leave it clamped for 6 hours.  He tolerated the procedure well without any complications.

## 2018-11-16 NOTE — Progress Notes (Signed)
Notify Dr. Brett Albino about patient's having low grade temperature since last noc, this morning was 100.2 asked if we can have PRN Tylenol for him, order given. MD states patient had an elevated LFT during this hospitalization so use tylenol sparingly. RN will continue to monitor.

## 2018-11-17 ENCOUNTER — Inpatient Hospital Stay: Payer: Medicaid Other

## 2018-11-17 DIAGNOSIS — I4891 Unspecified atrial fibrillation: Secondary | ICD-10-CM | POA: Diagnosis present

## 2018-11-17 DIAGNOSIS — F17200 Nicotine dependence, unspecified, uncomplicated: Secondary | ICD-10-CM | POA: Diagnosis present

## 2018-11-17 DIAGNOSIS — F1010 Alcohol abuse, uncomplicated: Secondary | ICD-10-CM | POA: Diagnosis present

## 2018-11-17 DIAGNOSIS — B179 Acute viral hepatitis, unspecified: Secondary | ICD-10-CM | POA: Diagnosis present

## 2018-11-17 LAB — COMPREHENSIVE METABOLIC PANEL
ALT: 93 U/L — ABNORMAL HIGH (ref 0–44)
AST: 55 U/L — ABNORMAL HIGH (ref 15–41)
Albumin: 1.9 g/dL — ABNORMAL LOW (ref 3.5–5.0)
Alkaline Phosphatase: 125 U/L (ref 38–126)
Anion gap: 10 (ref 5–15)
BUN: 7 mg/dL — ABNORMAL LOW (ref 8–23)
CO2: 24 mmol/L (ref 22–32)
Calcium: 8.2 mg/dL — ABNORMAL LOW (ref 8.9–10.3)
Chloride: 106 mmol/L (ref 98–111)
Creatinine, Ser: 0.81 mg/dL (ref 0.61–1.24)
GFR calc Af Amer: >60 mL/min (ref >60)
GFR calc non Af Amer: >60 mL/min (ref >60)
Glucose, Bld: 104 mg/dL — ABNORMAL HIGH (ref 70–99)
Potassium: 3.3 mmol/L — ABNORMAL LOW (ref 3.5–5.1)
Sodium: 140 mmol/L (ref 135–145)
Total Bilirubin: 1.5 mg/dL — ABNORMAL HIGH (ref 0.3–1.2)
Total Protein: 5.2 g/dL — ABNORMAL LOW (ref 6.5–8.1)

## 2018-11-17 LAB — CBC
HCT: 32.1 % — ABNORMAL LOW (ref 39.0–52.0)
Hemoglobin: 10.6 g/dL — ABNORMAL LOW (ref 13.0–17.0)
MCH: 31.1 pg (ref 26.0–34.0)
MCHC: 33 g/dL (ref 30.0–36.0)
MCV: 94.1 fL (ref 80.0–100.0)
Platelets: 489 10*3/uL — ABNORMAL HIGH (ref 150–400)
RBC: 3.41 MIL/uL — ABNORMAL LOW (ref 4.22–5.81)
RDW: 14.7 % (ref 11.5–15.5)
WBC: 15.3 10*3/uL — ABNORMAL HIGH (ref 4.0–10.5)
nRBC: 0 % (ref 0.0–0.2)

## 2018-11-17 MED ORDER — LEVOFLOXACIN 750 MG PO TABS
750.0000 mg | ORAL_TABLET | Freq: Once | ORAL | Status: AC
  Administered 2018-11-17: 750 mg via ORAL
  Filled 2018-11-17: qty 1

## 2018-11-17 NOTE — Progress Notes (Signed)
Patient's only IV occluded, writer and IV team unable to insert new IV and patient refusing to allow anyone to attempt IV access at this point. Dr. Wyonia Hough notified and this RN will attempt once more to place IV before end of shift.  Patient understands IV access would be necessary in the event of emergent situation, but continues to refused attempts at this time. Will monitor.  Report given to night RN and patient agreeable to let IV team attempt IV noc shift. Will place order for consult.

## 2018-11-17 NOTE — Plan of Care (Signed)
  Problem: Education: Goal: Knowledge of General Education information will improve Description: Including pain rating scale, medication(s)/side effects and non-pharmacologic comfort measures Outcome: Progressing   Problem: Clinical Measurements: Goal: Diagnostic test results will improve Outcome: Progressing Goal: Respiratory complications will improve Outcome: Progressing   Problem: Pain Managment: Goal: General experience of comfort will improve Outcome: Progressing   

## 2018-11-17 NOTE — Progress Notes (Signed)
Patient is off the floor having CT scan  Unable to review any images from CT scan.  None in the chart  We have tried to treat his empyema with nonoperative methods such as intrapleural TPA  If the CT shows a residual pleural effusion, I would recommend that we transfer the patient to main campus as I will be unavailable this week.  [employer].

## 2018-11-17 NOTE — Progress Notes (Signed)
PROGRESS NOTE    Thomas LickClinton D Coffey  QMV:[id number] DOB: 1955/06/24 DOA: 11/11/2018 PCP: Patient, [name]   Brief Narrative:  Patient states he feels fine today.  His shortness of breath has improved.  He is having some significant left chest wall pain today at the site of the chest tube.  He did have a fever to 101F overnight.  He states this was because it was very hot in his room last night.   Assessment & Plan:   Principal Problem:   Empyema lung (HCC)   Left sided empyema- meeting sepsis criteria on admission with tachycardia, leukocytosis, and lactic acidosis, all of which are improving. -s/p CT-guided left chest tube placement 10/27 -Cardiothoracic surgery following- patient will received his 3rd round of intrapleural thrombolytics 10/31 -repeat CT chest today-pending -Body fluid cultures with no growth to date -Continue cefepime -Blood cultures with no growth  Acute hepatitis- I agree, likely due to ischemic hepatitis in the setting of sepsis vs alcoholic hepatitis. Improving. -RUQ ultrasound was suggestive of cholelithiasis and showed some possible gallbladder wall thickening, but doubt cholecystitis without any right upper quadrant pain-patient still without pain -MRCP performed 10/27 with no evidence of biliary tract obstruction -Acute hepatitis panel was negative -Avoid hepatotoxins  A. fib with RVR- patient had one episode on admission, likely precipitated by his pneumonia. He has been in NSR since then. CHADSVASc is a 0. -No plans to place patient on long term anticoagulation -ECHO with EF 60-65%, mild LVH -Cardiac monitoring  Alcohol abuse- drinks 4 beers and 1/2 pint of liquor per day. -Stopped CIWA 10/31, as patient has been asymptomatic for 5 days -Continue with MVI, thiamine, folate  Tobacco use -Nicotine patch daily -Provided tobacco cessation counseling greater than 10 minutes  DVT prophylaxis: SCD/Compression stockings  Code Status: full      Code Status Orders  (From admission, onward)         Start     Ordered   11/11/18 2353  Full code  Continuous     11/11/18 2355        Code Status History    This patient has a current code status but no historical code status.   Advance Care Planning Activity     Family Communication: Discussed with son Disposition Plan:    Patient remained inpatient, chest tube in place, requires IV antibiotics, continue chest tube management, possible transfer to main campus for surgical intervention.  Patient not stable for discharge Consults called: None Admission status: Inpatient   Consultants:   ct surgery  Procedures:  Dg Chest 1 View  Result Date: 11/16/2018 CLINICAL DATA:  Follow-up for empyema.  Left-sided chest tube. EXAM: CHEST  1 VIEW COMPARISON:  11/15/2018 and earlier exams. FINDINGS: Pigtail chest tube projects over the left mid hemithorax, stable. There is hazy opacity from the left mid lung to the base, improved compared to the previous day's exam. Right lung is clear. No pneumothorax. IMPRESSION: 1. Improved lung aeration since the previous day's exam. Left lung opacities have decreased. No new lung abnormalities. No pneumothorax. Electronically Signed   By: Amie Portlandavid  Ormond M.D.   On: 11/16/2018 10:27   Dg Chest 1 View  Result Date: 11/11/2018 CLINICAL DATA:  Chest pain EXAM: CHEST  1 VIEW COMPARISON:  07/15/2005 FINDINGS: Defibrillator pads overlie the left lower chest. Increased left basilar ill-defined opacity obscures the left hemidiaphragm may represent atelectasis and/or consolidation. Difficult to exclude left lower lung pneumonia. Right lung remains clear. Normal heart size and  vascularity. No large effusion or pneumothorax. Trachea midline. Aorta atherosclerotic. Degenerative changes of the spine. IMPRESSION: Increased left lower lung ill-defined opacity compatible with atelectasis or developing airspace disease. Electronically Signed   By: Judie Petit.  Shick M.D.   On:  11/11/2018 19:28   Dg Chest 2 View  Result Date: 11/13/2018 CLINICAL DATA:  Status post left chest tube placement for drainage of empyema. EXAM: CHEST - 2 VIEW COMPARISON:  11/11/2018 FINDINGS: The heart size and mediastinal contours are within normal limits. Interval placement of left-sided pleural pigtail catheter with improved aeration of the left lung. No pneumothorax identified. Bibasilar atelectasis present. The visualized skeletal structures are unremarkable. IMPRESSION: No pneumothorax identified after placement of left-sided pleural pigtail catheter. Bibasilar atelectasis present with improved aeration of the left lung. Electronically Signed   By: Irish Lack M.D.   On: 11/13/2018 08:59   Ct Angio Chest Pe W And/or Wo Contrast  Result Date: 11/11/2018 CLINICAL DATA:  Abdominal pain and complex chest pain. EXAM: CT ANGIOGRAPHY CHEST CT ABDOMEN AND PELVIS WITH CONTRAST TECHNIQUE: Multidetector CT imaging of the chest was performed using the standard protocol during bolus administration of intravenous contrast. Multiplanar CT image reconstructions and MIPs were obtained to evaluate the vascular anatomy. Multidetector CT imaging of the abdomen and pelvis was performed using the standard protocol during bolus administration of intravenous contrast. CONTRAST:  60mL OMNIPAQUE IOHEXOL 350 MG/ML SOLN COMPARISON:  CT dated July 15, 2005 FINDINGS: CTA CHEST FINDINGS Cardiovascular: Contrast injection is sufficient to demonstrate satisfactory opacification of the pulmonary arteries to the segmental level. There is no pulmonary embolus. The main pulmonary artery is within normal limits for size. There is no CT evidence of acute right heart strain. The visualized aorta is normal. Heart size is normal, without pericardial effusion. Mediastinum/Nodes: --there is some mildly prominent mediastinal lymph nodes, likely reactive. --No axillary lymphadenopathy. --No supraclavicular lymphadenopathy. --Normal thyroid  gland. --The esophagus is unremarkable Lungs/Pleura: There is a large complex left-sided empyema with multiple pockets of gas scattered throughout the collection both in a dependent and nondependent location. There is consolidation of the left lower lobe with findings raising concern for necrotizing pneumonia. Moderate emphysematous changes are noted bilaterally. There is a trace right-sided pleural effusion there is some mild interlobular septal thickening. There is some debris within the trachea. Musculoskeletal: No chest wall abnormality. No acute or significant osseous findings. Review of the MIP images confirms the above findings. CT ABDOMEN and PELVIS FINDINGS Hepatobiliary: There is decreased hepatic attenuation suggestive of hepatic steatosis. Normal gallbladder.There is no biliary ductal dilation. Pancreas: Normal contours without ductal dilatation. No peripancreatic fluid collection. Spleen: No splenic laceration or hematoma. Adrenals/Urinary Tract: --Adrenal glands: No adrenal hemorrhage. --Right kidney/ureter: No hydronephrosis or perinephric hematoma. --Left kidney/ureter: No hydronephrosis or perinephric hematoma. --Urinary bladder: Unremarkable. Stomach/Bowel: --Stomach/Duodenum: There is moderate distention of the stomach with an air-fluid level --Small bowel: No dilatation or inflammation. --Colon: The patient is status post right hemicolectomy. --Appendix: Surgically absent. Vascular/Lymphatic: Normal course and caliber of the major abdominal vessels. --No retroperitoneal lymphadenopathy. --No mesenteric lymphadenopathy. --No pelvic or inguinal lymphadenopathy. Reproductive: Unremarkable Other: No ascites or free air. The abdominal wall is normal. Musculoskeletal. No acute displaced fractures. Review of the MIP images confirms the above findings. IMPRESSION: 1. Large complex left-sided empyema. 2. Consolidation of the left lower lobe with findings raising concern for necrotizing pneumonia. 3. Trace  right-sided pleural effusion. 4. No acute intra-abdominal or pelvic process. 5. Hepatic steatosis. 6. Moderate distention of the stomach. 7. Status  post right hemicolectomy. Aortic Atherosclerosis (ICD10-I70.0) and Emphysema (ICD10-J43.9). Electronically Signed   By: Katherine Mantle M.D.   On: 11/11/2018 21:37   Ct Abdomen Pelvis W Contrast  Result Date: 11/11/2018 CLINICAL DATA:  Abdominal pain and complex chest pain. EXAM: CT ANGIOGRAPHY CHEST CT ABDOMEN AND PELVIS WITH CONTRAST TECHNIQUE: Multidetector CT imaging of the chest was performed using the standard protocol during bolus administration of intravenous contrast. Multiplanar CT image reconstructions and MIPs were obtained to evaluate the vascular anatomy. Multidetector CT imaging of the abdomen and pelvis was performed using the standard protocol during bolus administration of intravenous contrast. CONTRAST:  60mL OMNIPAQUE IOHEXOL 350 MG/ML SOLN COMPARISON:  CT dated July 15, 2005 FINDINGS: CTA CHEST FINDINGS Cardiovascular: Contrast injection is sufficient to demonstrate satisfactory opacification of the pulmonary arteries to the segmental level. There is no pulmonary embolus. The main pulmonary artery is within normal limits for size. There is no CT evidence of acute right heart strain. The visualized aorta is normal. Heart size is normal, without pericardial effusion. Mediastinum/Nodes: --there is some mildly prominent mediastinal lymph nodes, likely reactive. --No axillary lymphadenopathy. --No supraclavicular lymphadenopathy. --Normal thyroid gland. --The esophagus is unremarkable Lungs/Pleura: There is a large complex left-sided empyema with multiple pockets of gas scattered throughout the collection both in a dependent and nondependent location. There is consolidation of the left lower lobe with findings raising concern for necrotizing pneumonia. Moderate emphysematous changes are noted bilaterally. There is a trace right-sided pleural  effusion there is some mild interlobular septal thickening. There is some debris within the trachea. Musculoskeletal: No chest wall abnormality. No acute or significant osseous findings. Review of the MIP images confirms the above findings. CT ABDOMEN and PELVIS FINDINGS Hepatobiliary: There is decreased hepatic attenuation suggestive of hepatic steatosis. Normal gallbladder.There is no biliary ductal dilation. Pancreas: Normal contours without ductal dilatation. No peripancreatic fluid collection. Spleen: No splenic laceration or hematoma. Adrenals/Urinary Tract: --Adrenal glands: No adrenal hemorrhage. --Right kidney/ureter: No hydronephrosis or perinephric hematoma. --Left kidney/ureter: No hydronephrosis or perinephric hematoma. --Urinary bladder: Unremarkable. Stomach/Bowel: --Stomach/Duodenum: There is moderate distention of the stomach with an air-fluid level --Small bowel: No dilatation or inflammation. --Colon: The patient is status post right hemicolectomy. --Appendix: Surgically absent. Vascular/Lymphatic: Normal course and caliber of the major abdominal vessels. --No retroperitoneal lymphadenopathy. --No mesenteric lymphadenopathy. --No pelvic or inguinal lymphadenopathy. Reproductive: Unremarkable Other: No ascites or free air. The abdominal wall is normal. Musculoskeletal. No acute displaced fractures. Review of the MIP images confirms the above findings. IMPRESSION: 1. Large complex left-sided empyema. 2. Consolidation of the left lower lobe with findings raising concern for necrotizing pneumonia. 3. Trace right-sided pleural effusion. 4. No acute intra-abdominal or pelvic process. 5. Hepatic steatosis. 6. Moderate distention of the stomach. 7. Status post right hemicolectomy. Aortic Atherosclerosis (ICD10-I70.0) and Emphysema (ICD10-J43.9). Electronically Signed   By: Katherine Mantle M.D.   On: 11/11/2018 21:37   US Renal  Result Date: 11/13/2018 CLINICAL DATA:  Acute kidney injury EXAM:  RENAL / URINARY TRACT ULTRASOUND COMPLETE COMPARISON:  CT 11/11/2018 FINDINGS: Right Kidney: Renal measurements: 13.7 x 9.5 x 5.9 cm = volume: 231 mL. Mildly increased echotexture throughout the right kidney. No mass or hydronephrosis. Left Kidney: Renal measurements: 12.8 x 5.9 x 5.1 cm = volume: 200 mL. Echogenicity within normal limits. No mass or hydronephrosis visualized. Bladder: Appears normal for degree of bladder distention. Other: IMPRESSION: Increased echotexture in the kidneys bilaterally suggesting medical renal disease. No hydronephrosis. Electronically Signed   By: Charlett Nose  M.D.   On: 11/13/2018 09:07   Mr Abdomen Mrcp Wo Contrast  Result Date: 11/12/2018 CLINICAL DATA:  63 year old male with history of abnormal liver function tests. Cholelithiasis. Right upper quadrant abdominal pain. EXAM: MRI ABDOMEN WITHOUT CONTRAST  (INCLUDING MRCP) TECHNIQUE: Multiplanar multisequence MR imaging of the abdomen was performed. Heavily T2-weighted images of the biliary and pancreatic ducts were obtained, and three-dimensional MRCP images were rendered by post processing. COMPARISON:  No prior abdominal MRI. CT the abdomen and pelvis 11/11/2018. FINDINGS: Comment: Study is limited for detection and characterization of visceral and/or vascular lesions by lack of IV gadolinium. Lower chest: Trace right pleural effusion. Complex multilocular left pleural effusion with probable atelectasis in the left lower lobe. Hepatobiliary: Mild diffuse loss of signal intensity throughout the hepatic parenchyma on out of phase dual echo images, indicative of mild hepatic steatosis. No discrete cystic or solid hepatic lesions are confidently identified on today's noncontrast examination. Gallbladder is normal in appearance. No intra or extrahepatic biliary ductal dilatation noted on MRCP images. Common bile duct is normal in caliber measuring 5 mm in the porta hepatis. No filling defects in the common bile duct to suggest  choledocholithiasis. Pancreas: No definite pancreatic mass identified on today's noncontrast examination. No pancreatic ductal dilatation noted on MRCP images. No pancreatic or peripancreatic fluid collections or inflammatory changes. Spleen:  Unremarkable. Adrenals/Urinary Tract: Unenhanced appearance of the kidneys and bilateral adrenal glands is unremarkable. No hydroureteronephrosis in the visualized portions of the abdomen. Stomach/Bowel: Visualized portions are unremarkable. Vascular/Lymphatic: Aortic atherosclerosis without definite aneurysm in the abdomen on today's noncontrast examination. No lymphadenopathy noted in the abdomen. Other: No significant volume of ascites in the visualized portions of the peritoneal cavity. Trace amount of perinephric fluid bilaterally (nonspecific). Musculoskeletal: No aggressive appearing osseous lesions are noted in the visualized portions of the skeleton. IMPRESSION: 1. No findings to suggest biliary tract obstruction. No choledocholithiasis. 2. Mild hepatic steatosis. 3. Trace right pleural effusion and large complex multilocular left pleural effusion with probable atelectasis in the left lower lobe. 4. Aortic atherosclerosis. Electronically Signed   By: Trudie Reedaniel  Entrikin M.D.   On: 11/12/2018 10:26   Mr 3d Recon At Scanner  Result Date: 11/12/2018 CLINICAL DATA:  63 year old male with history of abnormal liver function tests. Cholelithiasis. Right upper quadrant abdominal pain. EXAM: MRI ABDOMEN WITHOUT CONTRAST  (INCLUDING MRCP) TECHNIQUE: Multiplanar multisequence MR imaging of the abdomen was performed. Heavily T2-weighted images of the biliary and pancreatic ducts were obtained, and three-dimensional MRCP images were rendered by post processing. COMPARISON:  No prior abdominal MRI. CT the abdomen and pelvis 11/11/2018. FINDINGS: Comment: Study is limited for detection and characterization of visceral and/or vascular lesions by lack of IV gadolinium. Lower chest:  Trace right pleural effusion. Complex multilocular left pleural effusion with probable atelectasis in the left lower lobe. Hepatobiliary: Mild diffuse loss of signal intensity throughout the hepatic parenchyma on out of phase dual echo images, indicative of mild hepatic steatosis. No discrete cystic or solid hepatic lesions are confidently identified on today's noncontrast examination. Gallbladder is normal in appearance. No intra or extrahepatic biliary ductal dilatation noted on MRCP images. Common bile duct is normal in caliber measuring 5 mm in the porta hepatis. No filling defects in the common bile duct to suggest choledocholithiasis. Pancreas: No definite pancreatic mass identified on today's noncontrast examination. No pancreatic ductal dilatation noted on MRCP images. No pancreatic or peripancreatic fluid collections or inflammatory changes. Spleen:  Unremarkable. Adrenals/Urinary Tract: Unenhanced appearance of the kidneys and  bilateral adrenal glands is unremarkable. No hydroureteronephrosis in the visualized portions of the abdomen. Stomach/Bowel: Visualized portions are unremarkable. Vascular/Lymphatic: Aortic atherosclerosis without definite aneurysm in the abdomen on today's noncontrast examination. No lymphadenopathy noted in the abdomen. Other: No significant volume of ascites in the visualized portions of the peritoneal cavity. Trace amount of perinephric fluid bilaterally (nonspecific). Musculoskeletal: No aggressive appearing osseous lesions are noted in the visualized portions of the skeleton. IMPRESSION: 1. No findings to suggest biliary tract obstruction. No choledocholithiasis. 2. Mild hepatic steatosis. 3. Trace right pleural effusion and large complex multilocular left pleural effusion with probable atelectasis in the left lower lobe. 4. Aortic atherosclerosis. Electronically Signed   By: Vinnie Langton M.D.   On: 11/12/2018 10:26   Dg Chest Port 1 View  Result Date:  11/15/2018 CLINICAL DATA:  Chest tube in place EXAM: PORTABLE CHEST 1 VIEW COMPARISON:  11/13/2018 FINDINGS: Left-sided chest tube remains in position without significant pneumothorax. Slight interval increase in heterogeneous and interstitial opacity of the left lung. The right lung remains normally aerated. No focal airspace opacity. Heart and mediastinum are normal. IMPRESSION: Left-sided chest tube remains in position without significant pneumothorax. Slight interval increase in heterogeneous and interstitial opacity of the left lung. The right lung remains normally aerated. No focal airspace opacity Electronically Signed   By: Eddie Candle M.D.   On: 11/15/2018 08:42   Ct Image Guided Drainage By Percutaneous Catheter  Result Date: 11/12/2018 INDICATION: 63 year old with left chest empyema. EXAM: CT-GUIDED PLACEMENT OF LEFT CHEST TUBE MEDICATIONS: Moderate sedation ANESTHESIA/SEDATION: 2.0 mg IV Versed 100 mcg IV Fentanyl Moderate Sedation Time:  15 minutes The patient was continuously monitored during the procedure by the interventional radiology nurse under my direct supervision. COMPLICATIONS: None immediate. TECHNIQUE: Informed written consent was obtained from the patient after a thorough discussion of the procedural risks, benefits and alternatives. All questions were addressed. A timeout was performed prior to the initiation of the procedure. PROCEDURE: Patient was placed on his right side. Images through the chest were obtained. Left side of the chest was prepped with chlorhexidine and sterile field was created. Skin and soft tissues were anesthetized with 1% lidocaine. Using CT guidance, a Yueh catheter was directed into the pleural space and cloudy yellow fluid was aspirated. Stiff Amplatz wire was advanced into the pleural space. The tract was dilated to accommodate a 14 [place] multipurpose drain. Additional cloudy yellow fluid was collected and sent for culture. Catheter was sutured to skin  and attached to a PleurEvac collection device. Dressing was placed over the chest tube. FINDINGS: Complex left pleural effusion containing pockets of gas. Consolidation and volume loss in left lung. Cloudy yellow fluid was removed from the left pleural space. 68 French catheter positioned within the left pleural space. IMPRESSION: CT-guided placement of a left chest tube. Electronically Signed   By: Markus Daft M.D.   On: 11/12/2018 16:26   US Abdomen Limited Ruq  Result Date: 11/11/2018 CLINICAL DATA:  Pain EXAM: ULTRASOUND ABDOMEN LIMITED RIGHT UPPER QUADRANT COMPARISON:  None. FINDINGS: Gallbladder: Multiple gallstones are noted. There is gallbladder wall thickening with the gallbladder wall measuring approximately 4 mm. The sonographic Percell Miller sign is negative. There is no pericholecystic free fluid. Common bile duct: Diameter: 4 mm Liver: No focal lesion identified. Within normal limits in parenchymal echogenicity. Portal vein is patent on color Doppler imaging with normal direction of blood flow towards the liver. Other: None. IMPRESSION: Cholelithiasis with mild gallbladder wall thickening. In the absence of a  positive sonographic Murphy sign, these findings are equivocal for acute cholecystitis. If there is high clinical suspicion for acute cholecystitis, follow-up with HIDA scan is recommended. Electronically Signed   By: Katherine Mantle M.D.   On: 11/11/2018 20:28     Antimicrobials:   Cefepime >10/29   Subjective: Sitting bedside eating breakfast No acute events overnight  Objective: Vitals:   11/16/18 1936 11/16/18 2113 11/17/18 0452 11/17/18 0747  BP: (!) 103/59  127/75 115/73  Pulse: (!) 113 (!) 109 (!) 104 (!) 106  Resp: 20  19 20   Temp: 98.9 F (37.2 C)  98.9 F (37.2 C) (!) 100.4 F (38 C)  TempSrc: Oral  Oral Oral  SpO2: 95%  95% 94%  Weight:   73.2 kg   Height:        Intake/Output Summary (Last 24 hours) at 11/17/2018 1132 Last data filed at 11/17/2018  0451 Gross per 24 hour  Intake 1138.59 ml  Output 1100 ml  Net 38.59 ml   Filed Weights   11/13/18 0404 11/16/18 0449 11/17/18 0452  Weight: 75.3 kg 73.9 kg 73.2 kg    Examination:  General exam: Appears calm and comfortable  Respiratory system: Rales on the left, decreased breath sounds.  Chest tube in place no evidence of leakage purulence or bleeding at chest tube site Cardiovascular system: S1 & S2 heard, RRR. No JVD, murmurs, rubs, gallops or clicks. No pedal edema. Gastrointestinal system: Abdomen is nondistended, soft and nontender. No organomegaly or masses felt. Normal bowel sounds heard. Central nervous system: Alert and oriented. No focal neurological deficits. Extremities: Moves all 4 extremities freely, no focal neurological deficits Skin: No rashes, lesions or ulcers Psychiatry: Judgement and insight appear normal. Mood & affect appropriate.     Data Reviewed: I have personally reviewed following labs and imaging studies  CBC: Recent Labs  Lab 11/11/18 1836  11/13/18 0455 11/14/18 0520 11/15/18 0753 11/16/18 0531 11/17/18 0446  WBC 24.7*   < > 20.6* 21.8* 18.2* 18.0* 15.3*  NEUTROABS 21.3*  --   --   --   --   --   --   HGB 16.1   < > 11.6* 11.2* 11.0* 11.1* 10.6*  HCT 49.1   < > 34.5* 32.6* 32.8* 33.2* 32.1*  MCV 95.5   < > 93.8 90.8 92.9 92.2 94.1  PLT 363   < > 305 335 357 414* 489*   < > = values in this interval not displayed.   Basic Metabolic Panel: Recent Labs  Lab 11/11/18 1836 11/12/18 0501 11/12/18 1635 11/13/18 0455 11/14/18 0520 11/15/18 0753 11/16/18 0531 11/17/18 0446  NA 138 136  --  135 138 137 139 140  K 2.4* 3.9  --  3.5 3.8 3.4* 3.7 3.3*  CL 95* 103  --  105 108 106 105 106  CO2 17* 19*  --  22 24 23 26 24   GLUCOSE 53* 68*  --  120* 102* 143* 110* 104*  BUN 27* 38*  --  52* 31* 13 8 7*  CREATININE 2.04* 2.25*  --  2.06* 1.30* 0.95 0.79 0.81  CALCIUM 9.2 7.5*  --  8.0* 8.1* 8.4* 8.6* 8.2*  MG 1.5* 1.8 2.7*  --   --   --    --   --   PHOS  --   --  5.4*  --   --   --   --   --    GFR: Estimated Creatinine Clearance: 93.3 mL/min (by C-G  formula based on SCr of 0.81 mg/dL). Liver Function Tests: Recent Labs  Lab 11/13/18 0455 11/14/18 0520 11/15/18 0753 11/16/18 0531 11/17/18 0446  AST 1,488* 637* 200* 84* 55*  ALT 517* 338* 210* 139* 93*  ALKPHOS 100 160* 160* 158* 125  BILITOT 1.8* 1.3* 0.9 1.1 1.5*  PROT 5.2* 5.3* 5.5* 5.6* 5.2*  ALBUMIN 2.1* 2.0* 2.1* 2.0* 1.9*   Recent Labs  Lab 11/11/18 2345  LIPASE 17   No results for input(s): AMMONIA in the last 168 hours. Coagulation Profile: Recent Labs  Lab 11/11/18 1938 11/12/18 1635  INR 2.5* 1.9*   Cardiac Enzymes: No results for input(s): CKTOTAL, CKMB, CKMBINDEX, TROPONINI in the last 168 hours. BNP (last 3 results) No results for input(s): PROBNP in the last 8760 hours. HbA1C: No results for input(s): HGBA1C in the last 72 hours. CBG: No results for input(s): GLUCAP in the last 168 hours. Lipid Profile: No results for input(s): CHOL, HDL, LDLCALC, TRIG, CHOLHDL, LDLDIRECT in the last 72 hours. Thyroid Function Tests: No results for input(s): TSH, T4TOTAL, FREET4, T3FREE, THYROIDAB in the last 72 hours. Anemia Panel: No results for input(s): VITAMINB12, FOLATE, FERRITIN, TIBC, IRON, RETICCTPCT in the last 72 hours. Sepsis Labs: Recent Labs  Lab 11/12/18 0816 11/12/18 1635 11/13/18 0947 11/14/18 0755  LATICACIDVEN 2.9* 2.2* 2.3* 1.5    Recent Results (from the past 240 hour(s))  Blood Culture (routine x 2)     Status: None   Collection Time: 11/11/18  6:15 PM   Specimen: BLOOD  Result Value Ref Range Status   Specimen Description BLOOD BLOOD LEFT WRIST  Final   Special Requests   Final    BOTTLES DRAWN AEROBIC AND ANAEROBIC Blood Culture adequate volume   Culture   Final    NO GROWTH 5 DAYS Performed at [facility], [address], [place], [place] [address]    Report Status 11/16/2018 FINAL  Final  SARS  CORONAVIRUS 2 (TAT 6-24 HRS) Nasopharyngeal Nasopharyngeal Swab     Status: None   Collection Time: 11/11/18 10:41 PM   Specimen: Nasopharyngeal Swab  Result Value Ref Range Status   SARS Coronavirus 2 NEGATIVE NEGATIVE Final    Comment: (NOTE) SARS-CoV-2 target nucleic acids are NOT DETECTED. The SARS-CoV-2 RNA is generally detectable in upper and lower respiratory specimens during the acute phase of infection. Negative results do not preclude SARS-CoV-2 infection, do not rule out co-infections with other pathogens, and should not be used as the sole basis for treatment or other patient management decisions. Negative results must be combined with clinical observations, patient history, and epidemiological information. The expected result is Negative. Fact Sheet for Patients: HairSlick.no Fact Sheet for Healthcare Providers: quierodirigir.com This test is not yet approved or cleared by the [place] FDA and  has been authorized for detection and/or diagnosis of SARS-CoV-2 by FDA under an Emergency Use Authorization (EUA). This EUA will remain  in effect (meaning this test can be used) for the duration of the COVID-19 declaration under Section 56 4(b)(1) of the Act, 21 U.S.C. section 360bbb-3(b)(1), unless the authorization is terminated or revoked sooner. Performed at [facility], [address] [place], [place] [address]   Blood Culture (routine x 2)     Status: None   Collection Time: 11/11/18 11:45 PM   Specimen: BLOOD  Result Value Ref Range Status   Specimen Description BLOOD BLOOD LEFT WRIST  Final   Special Requests   Final    BOTTLES DRAWN AEROBIC  AND ANAEROBIC Blood Culture adequate volume   Culture   Final    NO GROWTH 5 DAYS Performed at [facility], [address] [place], [place] [address]    Report Status 11/16/2018 FINAL  Final  MRSA PCR Screening     Status: None   Collection Time:  11/12/18  7:50 AM   Specimen: Nasal Mucosa; Nasopharyngeal  Result Value Ref Range Status   MRSA by PCR NEGATIVE NEGATIVE Final    Comment:        The GeneXpert MRSA Assay (FDA approved for NASAL specimens only), is one component of a comprehensive MRSA colonization surveillance program. It is not intended to diagnose MRSA infection nor to guide or monitor treatment for MRSA infections. Performed at [facility], [address] [place], [place] [address]   Aerobic/Anaerobic Culture (surgical/deep wound)     Status: None (Preliminary result)   Collection Time: 11/12/18  5:08 PM   Specimen: Pleural Fluid  Result Value Ref Range Status   Specimen Description   Final    PLEURAL LEFT PLEURAL FLUID Performed at [facility], [address], [place], [place] [address]    Special Requests   Final    NONE Performed at [facility], [address] [place], [place] [address]    Gram Stain   Final    WBC PRESENT,BOTH PMN AND MONONUCLEAR NO ORGANISMS SEEN CYTOSPIN SMEAR    Culture   Final    NO GROWTH 4 DAYS NO ANAEROBES ISOLATED; CULTURE IN PROGRESS FOR 5 DAYS Performed at [facility], [address] [place], [place] [address]    Report Status PENDING  Incomplete  CULTURE, BLOOD (ROUTINE X 2) w Reflex to ID Panel     Status: None (Preliminary result)   Collection Time: 11/16/18  5:12 PM   Specimen: BLOOD  Result Value Ref Range Status   Specimen Description BLOOD BLOOD RIGHT WRIST  Final   Special Requests   Final    BOTTLES DRAWN AEROBIC AND ANAEROBIC Blood Culture adequate volume   Culture   Final    NO GROWTH < 24 HOURS Performed at [facility], [address] [place], [place] [address]    Report Status PENDING  Incomplete  CULTURE, BLOOD (ROUTINE X 2) w Reflex to ID Panel     Status: None (Preliminary result)   Collection Time: 11/16/18  5:27 PM   Specimen: BLOOD  Result Value Ref Range Status   Specimen Description BLOOD  BLOOD LEFT WRIST  Final   Special Requests   Final    BOTTLES DRAWN AEROBIC AND ANAEROBIC Blood Culture results may not be optimal due to an excessive volume of blood received in culture bottles   Culture   Final    NO GROWTH < 24 HOURS Performed at [facility], [address] [place], [place] [address]    Report Status PENDING  Incomplete         Radiology Studies: Dg Chest 1 View  Result Date: 11/16/2018 CLINICAL DATA:  Follow-up for empyema.  Left-sided chest tube. EXAM: CHEST  1 VIEW COMPARISON:  11/15/2018 and earlier exams. FINDINGS: Pigtail chest tube projects over the left mid hemithorax, stable. There is hazy opacity from the left mid lung to the base, improved compared to the previous day's exam. Right lung is clear. No pneumothorax. IMPRESSION: 1. Improved lung aeration since the previous day's exam. Left lung opacities have decreased. No new lung abnormalities. No pneumothorax. Electronically  Signed   By: Amie Portland M.D.   On: 11/16/2018 10:27        Scheduled Meds:  alteplase (tPA)  in NS 40mL for Dr.Oaks (intrapleural administration/[facility])   Intrapleural Once   aspirin EC  81 mg Oral Daily   feeding supplement (ENSURE ENLIVE)  237 mL Oral BID BM   folic acid  1 mg Oral Daily   multivitamin with minerals  1 tablet Oral Daily   nicotine  21 mg Transdermal Daily   thiamine  100 mg Oral Daily   Or   thiamine  100 mg Intravenous Daily   Continuous Infusions:  sodium chloride 250 mL (11/17/18 0600)   sodium chloride 75 mL/hr at 11/17/18 0451   ceFEPime (MAXIPIME) IV 2 g (11/17/18 0532)   vancomycin 1,000 mg (11/17/18 0601)     LOS: 6 days    Time spent: 35 min    Burke Keels, MD [facility]  If 7PM-7AM, please contact night-coverage  11/17/2018, 11:32 AM

## 2018-11-17 NOTE — Progress Notes (Signed)
Pt yelling and refusing IV or midline insertion.

## 2018-11-18 DIAGNOSIS — J869 Pyothorax without fistula: Secondary | ICD-10-CM

## 2018-11-18 LAB — CREATININE, SERUM
Creatinine, Ser: 0.89 mg/dL (ref 0.61–1.24)
GFR calc Af Amer: >60 mL/min (ref >60)
GFR calc non Af Amer: >60 mL/min (ref >60)

## 2018-11-18 NOTE — Progress Notes (Signed)
PROGRESS NOTE    KEDARIUS ALOISI  ZOX:[id number] DOB: [birth year] DOA: 11/11/2018 PCP: Patient, [name]   Brief Narrative:  Is a 63 year old white male with history of tobacco abuse presented to the hospital complaining of vague epigastric right upper quadrant and chest pain.  In the ER patient underwent blood work which showed acute kidney injury, abnormal LFTs, and ultrasound findings suggestive of suspected cholelithiasis with possible cholecystitis.  Is also known to be in A. fib RVR.  As part of his work-up in the ER he was found to have an elevated D-dimer and underwent a CTA of chest which was suggestive of a necrotizing left-sided pneumonia with empyema.  He was sent to the hospital service admitted for further eval and treatment..    Assessment & Plan:   Principal Problem:   Empyema lung (HCC) Active Problems:   Acute hepatitis   Atrial fibrillation with RVR (HCC)   Alcohol abuse   Tobacco dependence syndrome   Left sided empyema- meeting sepsis criteria on admission with tachycardia, leukocytosis, and lactic acidosis, all of which are improving. -s/p CT-guided left chest tube placement 10/27 -Cardiothoracic surgery following-patient will received his 3rd round ofintrapleural thrombolytics10/31 -repeat CT chest November 1 showed significant improvement-discussed the case with cardiothoracic recommendations for continued chest tube management and IV antibiotics. -If patient declines recommend transfer to [facility] Case has been discussed with [facility]. -Body fluid cultures with no growth to date -Continue cefepime -Blood cultures with no growth  Acute hepatitis- I agree, likely due to ischemic hepatitis in the setting of sepsis vs alcoholic hepatitis. Improving. -RUQ ultrasound was suggestive of cholelithiasis and showed some possible gallbladder wall thickening, but doubt cholecystitis without any right upper quadrant pain-patient still  without pain -MRCP performed 10/27 with no evidence of biliary tract obstruction -Acute hepatitis panel was negative -Avoid hepatotoxins -Monitor CMP  A. fib with RVR- patient had one episode on admission, likely precipitated byhispneumonia. He has been in NSR since then. CHADSVASc is a 0. -Noplans to place patient on long term anticoagulation -ECHO with EF 60-65%, mild LVH -Cardiac monitoring  Alcohol abuse- drinks 4 beers and 1/2 pint of liquor per day. -Stopped CIWA 10/31, as patient has been asymptomatic for 5 days -Continue with MVI, thiamine, folate  Tobacco use -Nicotine patch daily -Provided tobacco cessation counseling greater than 10 minutes  DVT prophylaxis: SCD/Compression stockings  Code Status: full    Code Status Orders  (From admission, onward)         Start     Ordered   11/11/18 2353  Full code  Continuous     11/11/18 2355        Code Status History    This patient has a current code status but no historical code status.   Advance Care Planning Activity     Family Communication: None today, discussed with patient detail with son yesterday Disposition Plan:   Patient remained inpatient, chest tube in place, requires IV antibiotics, continue chest tube management, possible transfer to main campus for surgical intervention if patient declines all currently stable and improving.  Patient not stable for discharge Consults called: None Admission status: Inpatient   Consultants:   Cardiothoracic surgery  Procedures:  Dg Chest 1 View  Result Date: 11/16/2018 CLINICAL DATA:  Follow-up for empyema.  Left-sided chest tube. EXAM: CHEST  1 VIEW COMPARISON:  11/15/2018 and earlier exams. FINDINGS: Pigtail chest tube projects over the left mid hemithorax, stable. There is hazy  opacity from the left mid lung to the base, improved compared to the previous day's exam. Right lung is clear. No pneumothorax. IMPRESSION: 1. Improved lung aeration since the  previous day's exam. Left lung opacities have decreased. No new lung abnormalities. No pneumothorax. Electronically Signed   By: Amie Portlandavid  Ormond M.D.   On: 11/16/2018 10:27   Dg Chest 1 View  Result Date: 11/11/2018 CLINICAL DATA:  Chest pain EXAM: CHEST  1 VIEW COMPARISON:  07/15/2005 FINDINGS: Defibrillator pads overlie the left lower chest. Increased left basilar ill-defined opacity obscures the left hemidiaphragm may represent atelectasis and/or consolidation. Difficult to exclude left lower lung pneumonia. Right lung remains clear. Normal heart size and vascularity. No large effusion or pneumothorax. Trachea midline. Aorta atherosclerotic. Degenerative changes of the spine. IMPRESSION: Increased left lower lung ill-defined opacity compatible with atelectasis or developing airspace disease. Electronically Signed   By: Judie PetitM.  Shick M.D.   On: 11/11/2018 19:28   Dg Chest 2 View  Result Date: 11/13/2018 CLINICAL DATA:  Status post left chest tube placement for drainage of empyema. EXAM: CHEST - 2 VIEW COMPARISON:  11/11/2018 FINDINGS: The heart size and mediastinal contours are within normal limits. Interval placement of left-sided pleural pigtail catheter with improved aeration of the left lung. No pneumothorax identified. Bibasilar atelectasis present. The visualized skeletal structures are unremarkable. IMPRESSION: No pneumothorax identified after placement of left-sided pleural pigtail catheter. Bibasilar atelectasis present with improved aeration of the left lung. Electronically Signed   By: Irish LackGlenn  Yamagata M.D.   On: 11/13/2018 08:59   Ct Chest Wo Contrast  Result Date: 11/17/2018 CLINICAL DATA:  Follow-up left empyema status post chest tube drainage. EXAM: CT CHEST WITHOUT CONTRAST TECHNIQUE: Multidetector CT imaging of the chest was performed following the standard protocol without IV contrast. COMPARISON:  11/11/2018 FINDINGS: Cardiovascular: No acute findings. Aortic and coronary artery  atherosclerosis. Mediastinum/Nodes: No masses or pathologically enlarged lymph nodes identified on this unenhanced exam. Lungs/Pleura: There has been placement of a left pleural pigtail drainage catheter since prior study with near complete resolution of left pleural fluid collection since previous study. Mild left lower lobe atelectasis has significantly decreased since previous study. A small right pleural effusion and mild dependent atelectasis have increased since previous study. No evidence of pulmonary consolidation or mass. Mild emphysema again noted. Upper Abdomen:  Unremarkable. Musculoskeletal:  No suspicious bone lesions. IMPRESSION: Near complete resolution of left pleural fluid collection and decreased left lower lobe atelectasis following placement of left pleural drainage catheter. Increased small right pleural effusion and mild dependent right lower lobe atelectasis. Aortic Atherosclerosis (ICD10-I70.0) and Emphysema (ICD10-J43.9). Coronary artery atherosclerosis. Electronically Signed   By: Danae OrleansJohn A Stahl M.D.   On: 11/17/2018 14:03   Ct Angio Chest Pe W And/or Wo Contrast  Result Date: 11/11/2018 CLINICAL DATA:  Abdominal pain and complex chest pain. EXAM: CT ANGIOGRAPHY CHEST CT ABDOMEN AND PELVIS WITH CONTRAST TECHNIQUE: Multidetector CT imaging of the chest was performed using the standard protocol during bolus administration of intravenous contrast. Multiplanar CT image reconstructions and MIPs were obtained to evaluate the vascular anatomy. Multidetector CT imaging of the abdomen and pelvis was performed using the standard protocol during bolus administration of intravenous contrast. CONTRAST:  60mL OMNIPAQUE IOHEXOL 350 MG/ML SOLN COMPARISON:  CT dated July 15, 2005 FINDINGS: CTA CHEST FINDINGS Cardiovascular: Contrast injection is sufficient to demonstrate satisfactory opacification of the pulmonary arteries to the segmental level. There is no pulmonary embolus. The main pulmonary artery  is within normal limits for size.  There is no CT evidence of acute right heart strain. The visualized aorta is normal. Heart size is normal, without pericardial effusion. Mediastinum/Nodes: --there is some mildly prominent mediastinal lymph nodes, likely reactive. --No axillary lymphadenopathy. --No supraclavicular lymphadenopathy. --Normal thyroid gland. --The esophagus is unremarkable Lungs/Pleura: There is a large complex left-sided empyema with multiple pockets of gas scattered throughout the collection both in a dependent and nondependent location. There is consolidation of the left lower lobe with findings raising concern for necrotizing pneumonia. Moderate emphysematous changes are noted bilaterally. There is a trace right-sided pleural effusion there is some mild interlobular septal thickening. There is some debris within the trachea. Musculoskeletal: No chest wall abnormality. No acute or significant osseous findings. Review of the MIP images confirms the above findings. CT ABDOMEN and PELVIS FINDINGS Hepatobiliary: There is decreased hepatic attenuation suggestive of hepatic steatosis. Normal gallbladder.There is no biliary ductal dilation. Pancreas: Normal contours without ductal dilatation. No peripancreatic fluid collection. Spleen: No splenic laceration or hematoma. Adrenals/Urinary Tract: --Adrenal glands: No adrenal hemorrhage. --Right kidney/ureter: No hydronephrosis or perinephric hematoma. --Left kidney/ureter: No hydronephrosis or perinephric hematoma. --Urinary bladder: Unremarkable. Stomach/Bowel: --Stomach/Duodenum: There is moderate distention of the stomach with an air-fluid level --Small bowel: No dilatation or inflammation. --Colon: The patient is status post right hemicolectomy. --Appendix: Surgically absent. Vascular/Lymphatic: Normal course and caliber of the major abdominal vessels. --No retroperitoneal lymphadenopathy. --No mesenteric lymphadenopathy. --No pelvic or inguinal  lymphadenopathy. Reproductive: Unremarkable Other: No ascites or free air. The abdominal wall is normal. Musculoskeletal. No acute displaced fractures. Review of the MIP images confirms the above findings. IMPRESSION: 1. Large complex left-sided empyema. 2. Consolidation of the left lower lobe with findings raising concern for necrotizing pneumonia. 3. Trace right-sided pleural effusion. 4. No acute intra-abdominal or pelvic process. 5. Hepatic steatosis. 6. Moderate distention of the stomach. 7. Status post right hemicolectomy. Aortic Atherosclerosis (ICD10-I70.0) and Emphysema (ICD10-J43.9). Electronically Signed   By: Katherine Mantle M.D.   On: 11/11/2018 21:37   Ct Abdomen Pelvis W Contrast  Result Date: 11/11/2018 CLINICAL DATA:  Abdominal pain and complex chest pain. EXAM: CT ANGIOGRAPHY CHEST CT ABDOMEN AND PELVIS WITH CONTRAST TECHNIQUE: Multidetector CT imaging of the chest was performed using the standard protocol during bolus administration of intravenous contrast. Multiplanar CT image reconstructions and MIPs were obtained to evaluate the vascular anatomy. Multidetector CT imaging of the abdomen and pelvis was performed using the standard protocol during bolus administration of intravenous contrast. CONTRAST:  40mL OMNIPAQUE IOHEXOL 350 MG/ML SOLN COMPARISON:  CT dated July 15, 2005 FINDINGS: CTA CHEST FINDINGS Cardiovascular: Contrast injection is sufficient to demonstrate satisfactory opacification of the pulmonary arteries to the segmental level. There is no pulmonary embolus. The main pulmonary artery is within normal limits for size. There is no CT evidence of acute right heart strain. The visualized aorta is normal. Heart size is normal, without pericardial effusion. Mediastinum/Nodes: --there is some mildly prominent mediastinal lymph nodes, likely reactive. --No axillary lymphadenopathy. --No supraclavicular lymphadenopathy. --Normal thyroid gland. --The esophagus is unremarkable  Lungs/Pleura: There is a large complex left-sided empyema with multiple pockets of gas scattered throughout the collection both in a dependent and nondependent location. There is consolidation of the left lower lobe with findings raising concern for necrotizing pneumonia. Moderate emphysematous changes are noted bilaterally. There is a trace right-sided pleural effusion there is some mild interlobular septal thickening. There is some debris within the trachea. Musculoskeletal: No chest wall abnormality. No acute or significant osseous findings. Review of the MIP  images confirms the above findings. CT ABDOMEN and PELVIS FINDINGS Hepatobiliary: There is decreased hepatic attenuation suggestive of hepatic steatosis. Normal gallbladder.There is no biliary ductal dilation. Pancreas: Normal contours without ductal dilatation. No peripancreatic fluid collection. Spleen: No splenic laceration or hematoma. Adrenals/Urinary Tract: --Adrenal glands: No adrenal hemorrhage. --Right kidney/ureter: No hydronephrosis or perinephric hematoma. --Left kidney/ureter: No hydronephrosis or perinephric hematoma. --Urinary bladder: Unremarkable. Stomach/Bowel: --Stomach/Duodenum: There is moderate distention of the stomach with an air-fluid level --Small bowel: No dilatation or inflammation. --Colon: The patient is status post right hemicolectomy. --Appendix: Surgically absent. Vascular/Lymphatic: Normal course and caliber of the major abdominal vessels. --No retroperitoneal lymphadenopathy. --No mesenteric lymphadenopathy. --No pelvic or inguinal lymphadenopathy. Reproductive: Unremarkable Other: No ascites or free air. The abdominal wall is normal. Musculoskeletal. No acute displaced fractures. Review of the MIP images confirms the above findings. IMPRESSION: 1. Large complex left-sided empyema. 2. Consolidation of the left lower lobe with findings raising concern for necrotizing pneumonia. 3. Trace right-sided pleural effusion. 4. No  acute intra-abdominal or pelvic process. 5. Hepatic steatosis. 6. Moderate distention of the stomach. 7. Status post right hemicolectomy. Aortic Atherosclerosis (ICD10-I70.0) and Emphysema (ICD10-J43.9). Electronically Signed   By: Katherine Mantle M.D.   On: 11/11/2018 21:37   US Renal  Result Date: 11/13/2018 CLINICAL DATA:  Acute kidney injury EXAM: RENAL / URINARY TRACT ULTRASOUND COMPLETE COMPARISON:  CT 11/11/2018 FINDINGS: Right Kidney: Renal measurements: 13.7 x 9.5 x 5.9 cm = volume: 231 mL. Mildly increased echotexture throughout the right kidney. No mass or hydronephrosis. Left Kidney: Renal measurements: 12.8 x 5.9 x 5.1 cm = volume: 200 mL. Echogenicity within normal limits. No mass or hydronephrosis visualized. Bladder: Appears normal for degree of bladder distention. Other: IMPRESSION: Increased echotexture in the kidneys bilaterally suggesting medical renal disease. No hydronephrosis. Electronically Signed   By: Charlett Nose M.D.   On: 11/13/2018 09:07   Mr Abdomen Mrcp Wo Contrast  Result Date: 11/12/2018 CLINICAL DATA:  63 year old male with history of abnormal liver function tests. Cholelithiasis. Right upper quadrant abdominal pain. EXAM: MRI ABDOMEN WITHOUT CONTRAST  (INCLUDING MRCP) TECHNIQUE: Multiplanar multisequence MR imaging of the abdomen was performed. Heavily T2-weighted images of the biliary and pancreatic ducts were obtained, and three-dimensional MRCP images were rendered by post processing. COMPARISON:  No prior abdominal MRI. CT the abdomen and pelvis 11/11/2018. FINDINGS: Comment: Study is limited for detection and characterization of visceral and/or vascular lesions by lack of IV gadolinium. Lower chest: Trace right pleural effusion. Complex multilocular left pleural effusion with probable atelectasis in the left lower lobe. Hepatobiliary: Mild diffuse loss of signal intensity throughout the hepatic parenchyma on out of phase dual echo images, indicative of mild  hepatic steatosis. No discrete cystic or solid hepatic lesions are confidently identified on today's noncontrast examination. Gallbladder is normal in appearance. No intra or extrahepatic biliary ductal dilatation noted on MRCP images. Common bile duct is normal in caliber measuring 5 mm in the porta hepatis. No filling defects in the common bile duct to suggest choledocholithiasis. Pancreas: No definite pancreatic mass identified on today's noncontrast examination. No pancreatic ductal dilatation noted on MRCP images. No pancreatic or peripancreatic fluid collections or inflammatory changes. Spleen:  Unremarkable. Adrenals/Urinary Tract: Unenhanced appearance of the kidneys and bilateral adrenal glands is unremarkable. No hydroureteronephrosis in the visualized portions of the abdomen. Stomach/Bowel: Visualized portions are unremarkable. Vascular/Lymphatic: Aortic atherosclerosis without definite aneurysm in the abdomen on today's noncontrast examination. No lymphadenopathy noted in the abdomen. Other: No significant volume of ascites  in the visualized portions of the peritoneal cavity. Trace amount of perinephric fluid bilaterally (nonspecific). Musculoskeletal: No aggressive appearing osseous lesions are noted in the visualized portions of the skeleton. IMPRESSION: 1. No findings to suggest biliary tract obstruction. No choledocholithiasis. 2. Mild hepatic steatosis. 3. Trace right pleural effusion and large complex multilocular left pleural effusion with probable atelectasis in the left lower lobe. 4. Aortic atherosclerosis. Electronically Signed   By: Trudie Reed M.D.   On: 11/12/2018 10:26   Mr 3d Recon At Scanner  Result Date: 11/12/2018 CLINICAL DATA:  63 year old male with history of abnormal liver function tests. Cholelithiasis. Right upper quadrant abdominal pain. EXAM: MRI ABDOMEN WITHOUT CONTRAST  (INCLUDING MRCP) TECHNIQUE: Multiplanar multisequence MR imaging of the abdomen was performed.  Heavily T2-weighted images of the biliary and pancreatic ducts were obtained, and three-dimensional MRCP images were rendered by post processing. COMPARISON:  No prior abdominal MRI. CT the abdomen and pelvis 11/11/2018. FINDINGS: Comment: Study is limited for detection and characterization of visceral and/or vascular lesions by lack of IV gadolinium. Lower chest: Trace right pleural effusion. Complex multilocular left pleural effusion with probable atelectasis in the left lower lobe. Hepatobiliary: Mild diffuse loss of signal intensity throughout the hepatic parenchyma on out of phase dual echo images, indicative of mild hepatic steatosis. No discrete cystic or solid hepatic lesions are confidently identified on today's noncontrast examination. Gallbladder is normal in appearance. No intra or extrahepatic biliary ductal dilatation noted on MRCP images. Common bile duct is normal in caliber measuring 5 mm in the porta hepatis. No filling defects in the common bile duct to suggest choledocholithiasis. Pancreas: No definite pancreatic mass identified on today's noncontrast examination. No pancreatic ductal dilatation noted on MRCP images. No pancreatic or peripancreatic fluid collections or inflammatory changes. Spleen:  Unremarkable. Adrenals/Urinary Tract: Unenhanced appearance of the kidneys and bilateral adrenal glands is unremarkable. No hydroureteronephrosis in the visualized portions of the abdomen. Stomach/Bowel: Visualized portions are unremarkable. Vascular/Lymphatic: Aortic atherosclerosis without definite aneurysm in the abdomen on today's noncontrast examination. No lymphadenopathy noted in the abdomen. Other: No significant volume of ascites in the visualized portions of the peritoneal cavity. Trace amount of perinephric fluid bilaterally (nonspecific). Musculoskeletal: No aggressive appearing osseous lesions are noted in the visualized portions of the skeleton. IMPRESSION: 1. No findings to suggest  biliary tract obstruction. No choledocholithiasis. 2. Mild hepatic steatosis. 3. Trace right pleural effusion and large complex multilocular left pleural effusion with probable atelectasis in the left lower lobe. 4. Aortic atherosclerosis. Electronically Signed   By: Trudie Reed M.D.   On: 11/12/2018 10:26   Dg Chest Port 1 View  Result Date: 11/15/2018 CLINICAL DATA:  Chest tube in place EXAM: PORTABLE CHEST 1 VIEW COMPARISON:  11/13/2018 FINDINGS: Left-sided chest tube remains in position without significant pneumothorax. Slight interval increase in heterogeneous and interstitial opacity of the left lung. The right lung remains normally aerated. No focal airspace opacity. Heart and mediastinum are normal. IMPRESSION: Left-sided chest tube remains in position without significant pneumothorax. Slight interval increase in heterogeneous and interstitial opacity of the left lung. The right lung remains normally aerated. No focal airspace opacity Electronically Signed   By: Lauralyn Primes M.D.   On: 11/15/2018 08:42   Ct Image Guided Drainage By Percutaneous Catheter  Result Date: 11/12/2018 INDICATION: 63 year old with left chest empyema. EXAM: CT-GUIDED PLACEMENT OF LEFT CHEST TUBE MEDICATIONS: Moderate sedation ANESTHESIA/SEDATION: 2.0 mg IV Versed 100 mcg IV Fentanyl Moderate Sedation Time:  15 minutes The patient was continuously  monitored during the procedure by the interventional radiology nurse under my direct supervision. COMPLICATIONS: None immediate. TECHNIQUE: Informed written consent was obtained from the patient after a thorough discussion of the procedural risks, benefits and alternatives. All questions were addressed. A timeout was performed prior to the initiation of the procedure. PROCEDURE: Patient was placed on his right side. Images through the chest were obtained. Left side of the chest was prepped with chlorhexidine and sterile field was created. Skin and soft tissues were  anesthetized with 1% lidocaine. Using CT guidance, a Yueh catheter was directed into the pleural space and cloudy yellow fluid was aspirated. Stiff Amplatz wire was advanced into the pleural space. The tract was dilated to accommodate a 14 [place]French multipurpose drain. Additional cloudy yellow fluid was collected and sent for culture. Catheter was sutured to skin and attached to a PleurEvac collection device. Dressing was placed over the chest tube. FINDINGS: Complex left pleural effusion containing pockets of gas. Consolidation and volume loss in left lung. Cloudy yellow fluid was removed from the left pleural space. 14 French catheter positioned within the left pleural space. IMPRESSION: CT-guided placement of a left chest tube. Electronically Signed   By: Richarda OverlieAdam  Henn M.D.   On: 11/12/2018 16:26   [place] Abdomen Limited Ruq  Result Date: 11/11/2018 CLINICAL DATA:  Pain EXAM: ULTRASOUND ABDOMEN LIMITED RIGHT UPPER QUADRANT COMPARISON:  None. FINDINGS: Gallbladder: Multiple gallstones are noted. There is gallbladder wall thickening with the gallbladder wall measuring approximately 4 mm. The sonographic Eulah PontMurphy sign is negative. There is no pericholecystic free fluid. Common bile duct: Diameter: 4 mm Liver: No focal lesion identified. Within normal limits in parenchymal echogenicity. Portal vein is patent on color Doppler imaging with normal direction of blood flow towards the liver. Other: None. IMPRESSION: Cholelithiasis with mild gallbladder wall thickening. In the absence of a positive sonographic Murphy sign, these findings are equivocal for acute cholecystitis. If there is high clinical suspicion for acute cholecystitis, follow-up with HIDA scan is recommended. Electronically Signed   By: Katherine Mantlehristopher  Green M.D.   On: 11/11/2018 20:28     Antimicrobials:   Cefepime   Subjective: Patient lost his IV yesterday but was somewhat reticent about replacing.  Patient ultimately agreed IV was placed antibiotics  continued  Objective: Vitals:   11/17/18 1550 11/17/18 2046 11/18/18 0458 11/18/18 0737  BP: 118/72 103/68 118/72 136/83  Pulse: (!) 109 (!) 110 89 96  Resp: 20 20 18 20   Temp: (!) 100.4 F (38 C) 99 F (37.2 C) 98.7 F (37.1 C) 98.1 F (36.7 C)  TempSrc: Oral Oral Oral Oral  SpO2: 94% 98% 95% 97%  Weight:   70.9 kg   Height:        Intake/Output Summary (Last 24 hours) at 11/18/2018 1222 Last data filed at 11/18/2018 1100 Gross per 24 hour  Intake 1108.31 ml  Output 830 ml  Net 278.31 ml   Filed Weights   11/16/18 0449 11/17/18 0452 11/18/18 0458  Weight: 73.9 kg 73.2 kg 70.9 kg    Examination:  General exam: Appears calm and comfortable  Respiratory system: Mild rales bilaterally improved aeration, no accessory muscle use l. Cardiovascular system: S1 & S2 heard, RRR. No JVD, murmurs, rubs, gallops or clicks. No pedal edema. Gastrointestinal system: Abdomen is nondistended, soft and nontender. No organomegaly or masses felt. Normal bowel sounds heard. Central nervous system: Alert and oriented. No focal neurological deficits. Extremities: Warm well perfused, moves all 4 extremities freely, neurovascularly intact Skin: No rashes,  lesions or ulcers Psychiatry: Judgement and insight appear normal. Mood & affect appropriate.     Data Reviewed: I have personally reviewed following labs and imaging studies  CBC: Recent Labs  Lab 11/11/18 1836  11/13/18 0455 11/14/18 0520 11/15/18 0753 11/16/18 0531 11/17/18 0446  WBC 24.7*   < > 20.6* 21.8* 18.2* 18.0* 15.3*  NEUTROABS 21.3*  --   --   --   --   --   --   HGB 16.1   < > 11.6* 11.2* 11.0* 11.1* 10.6*  HCT 49.1   < > 34.5* 32.6* 32.8* 33.2* 32.1*  MCV 95.5   < > 93.8 90.8 92.9 92.2 94.1  PLT 363   < > 305 335 357 414* 489*   < > = values in this interval not displayed.   Basic Metabolic Panel: Recent Labs  Lab 11/11/18 1836 11/12/18 0501 11/12/18 1635 11/13/18 0455 11/14/18 0520 11/15/18 0753  11/16/18 0531 11/17/18 0446 11/18/18 0440  NA 138 136  --  135 138 137 139 140  --   K 2.4* 3.9  --  3.5 3.8 3.4* 3.7 3.3*  --   CL 95* 103  --  105 108 106 105 106  --   CO2 17* 19*  --  22 24 23 26 24   --   GLUCOSE 53* 68*  --  120* 102* 143* 110* 104*  --   BUN 27* 38*  --  52* 31* 13 8 7*  --   CREATININE 2.04* 2.25*  --  2.06* 1.30* 0.95 0.79 0.81 0.89  CALCIUM 9.2 7.5*  --  8.0* 8.1* 8.4* 8.6* 8.2*  --   MG 1.5* 1.8 2.7*  --   --   --   --   --   --   PHOS  --   --  5.4*  --   --   --   --   --   --    GFR: Estimated Creatinine Clearance: 85 mL/min (by C-G formula based on SCr of 0.89 mg/dL). Liver Function Tests: Recent Labs  Lab 11/13/18 0455 11/14/18 0520 11/15/18 0753 11/16/18 0531 11/17/18 0446  AST 1,488* 637* 200* 84* 55*  ALT 517* 338* 210* 139* 93*  ALKPHOS 100 160* 160* 158* 125  BILITOT 1.8* 1.3* 0.9 1.1 1.5*  PROT 5.2* 5.3* 5.5* 5.6* 5.2*  ALBUMIN 2.1* 2.0* 2.1* 2.0* 1.9*   Recent Labs  Lab 11/11/18 2345  LIPASE 17   No results for input(s): AMMONIA in the last 168 hours. Coagulation Profile: Recent Labs  Lab 11/11/18 1938 11/12/18 1635  INR 2.5* 1.9*   Cardiac Enzymes: No results for input(s): CKTOTAL, CKMB, CKMBINDEX, TROPONINI in the last 168 hours. BNP (last 3 results) No results for input(s): PROBNP in the last 8760 hours. HbA1C: No results for input(s): HGBA1C in the last 72 hours. CBG: No results for input(s): GLUCAP in the last 168 hours. Lipid Profile: No results for input(s): CHOL, HDL, LDLCALC, TRIG, CHOLHDL, LDLDIRECT in the last 72 hours. Thyroid Function Tests: No results for input(s): TSH, T4TOTAL, FREET4, T3FREE, THYROIDAB in the last 72 hours. Anemia Panel: No results for input(s): VITAMINB12, FOLATE, FERRITIN, TIBC, IRON, RETICCTPCT in the last 72 hours. Sepsis Labs: Recent Labs  Lab 11/12/18 0816 11/12/18 1635 11/13/18 0947 11/14/18 0755  LATICACIDVEN 2.9* 2.2* 2.3* 1.5    Recent Results (from the past 240  hour(s))  Blood Culture (routine x 2)     Status: None   Collection Time: 11/11/18  6:15 PM   Specimen: BLOOD  Result Value Ref Range Status   Specimen Description BLOOD BLOOD LEFT WRIST  Final   Special Requests   Final    BOTTLES DRAWN AEROBIC AND ANAEROBIC Blood Culture adequate volume   Culture   Final    NO GROWTH 5 DAYS Performed at [facility], [address] [place], [place] [address]    Report Status 11/16/2018 FINAL  Final  SARS CORONAVIRUS 2 (TAT 6-24 HRS) Nasopharyngeal Nasopharyngeal Swab     Status: None   Collection Time: 11/11/18 10:41 PM   Specimen: Nasopharyngeal Swab  Result Value Ref Range Status   SARS Coronavirus 2 NEGATIVE NEGATIVE Final    Comment: (NOTE) SARS-CoV-2 target nucleic acids are NOT DETECTED. The SARS-CoV-2 RNA is generally detectable in upper and lower respiratory specimens during the acute phase of infection. Negative results do not preclude SARS-CoV-2 infection, do not rule out co-infections with other pathogens, and should not be used as the sole basis for treatment or other patient management decisions. Negative results must be combined with clinical observations, patient history, and epidemiological information. The expected result is Negative. Fact Sheet for Patients: HairSlick.no Fact Sheet for Healthcare Providers: quierodirigir.com This test is not yet approved or cleared by the [place] FDA and  has been authorized for detection and/or diagnosis of SARS-CoV-2 by FDA under an Emergency Use Authorization (EUA). This EUA will remain  in effect (meaning this test can be used) for the duration of the COVID-19 declaration under Section 56 4(b)(1) of the Act, 21 U.S.C. section 360bbb-3(b)(1), unless the authorization is terminated or revoked sooner. Performed at [facility], [address], [place], [place] [address]   Blood Culture (routine x 2)     Status:  None   Collection Time: 11/11/18 11:45 PM   Specimen: BLOOD  Result Value Ref Range Status   Specimen Description BLOOD BLOOD LEFT WRIST  Final   Special Requests   Final    BOTTLES DRAWN AEROBIC AND ANAEROBIC Blood Culture adequate volume   Culture   Final    NO GROWTH 5 DAYS Performed at [facility], [address] [place], [place] [address]    Report Status 11/16/2018 FINAL  Final  MRSA PCR Screening     Status: None   Collection Time: 11/12/18  7:50 AM   Specimen: Nasal Mucosa; Nasopharyngeal  Result Value Ref Range Status   MRSA by PCR NEGATIVE NEGATIVE Final    Comment:        The GeneXpert MRSA Assay (FDA approved for NASAL specimens only), is one component of a comprehensive MRSA colonization surveillance program. It is not intended to diagnose MRSA infection nor to guide or monitor treatment for MRSA infections. Performed at [facility], [address] [place], [place] [address]   Aerobic/Anaerobic Culture (surgical/deep wound)     Status: None (Preliminary result)   Collection Time: 11/12/18  5:08 PM   Specimen: Pleural Fluid  Result Value Ref Range Status   Specimen Description   Final    PLEURAL LEFT PLEURAL FLUID Performed at [facility], [address], [place], [place] [address]    Special Requests   Final    NONE Performed at [facility], [address] [place], [place] [address]    Gram Stain   Final    WBC PRESENT,BOTH PMN AND MONONUCLEAR NO ORGANISMS SEEN CYTOSPIN SMEAR Performed at [facility], [address], [place], [place]  [address]    Culture   Final    CULTURE REINCUBATED FOR BETTER GROWTH NO ANAEROBES ISOLATED; CULTURE IN PROGRESS FOR 5 DAYS   Report Status PENDING  Incomplete  CULTURE, BLOOD (ROUTINE X 2) w Reflex to ID Panel     Status: None (Preliminary result)   Collection Time: 11/16/18  5:12 PM   Specimen: BLOOD  Result Value Ref Range Status   Specimen Description BLOOD BLOOD  RIGHT WRIST  Final   Special Requests   Final    BOTTLES DRAWN AEROBIC AND ANAEROBIC Blood Culture adequate volume   Culture   Final    NO GROWTH 2 DAYS Performed at [facility], [address] [place], [place] [address]    Report Status PENDING  Incomplete  CULTURE, BLOOD (ROUTINE X 2) w Reflex to ID Panel     Status: None (Preliminary result)   Collection Time: 11/16/18  5:27 PM   Specimen: BLOOD  Result Value Ref Range Status   Specimen Description BLOOD BLOOD LEFT WRIST  Final   Special Requests   Final    BOTTLES DRAWN AEROBIC AND ANAEROBIC Blood Culture results may not be optimal due to an excessive volume of blood received in culture bottles   Culture   Final    NO GROWTH 2 DAYS Performed at [facility], [address] [place], [place] [address]    Report Status PENDING  Incomplete         Radiology Studies: Ct Chest Wo Contrast  Result Date: 11/17/2018 CLINICAL DATA:  Follow-up left empyema status post chest tube drainage. EXAM: CT CHEST WITHOUT CONTRAST TECHNIQUE: Multidetector CT imaging of the chest was performed following the standard protocol without IV contrast. COMPARISON:  11/11/2018 FINDINGS: Cardiovascular: No acute findings. Aortic and coronary artery atherosclerosis. Mediastinum/Nodes: No masses or pathologically enlarged lymph nodes identified on this unenhanced exam. Lungs/Pleura: There has been placement of a left pleural pigtail drainage catheter since prior study with near complete resolution of left pleural fluid collection since previous study. Mild left lower lobe atelectasis has significantly decreased since previous study. A small right pleural effusion and mild dependent atelectasis have increased since previous study. No evidence of pulmonary consolidation or mass. Mild emphysema again noted. Upper Abdomen:  Unremarkable. Musculoskeletal:  No suspicious bone lesions. IMPRESSION: Near complete resolution of left pleural fluid  collection and decreased left lower lobe atelectasis following placement of left pleural drainage catheter. Increased small right pleural effusion and mild dependent right lower lobe atelectasis. Aortic Atherosclerosis (ICD10-I70.0) and Emphysema (ICD10-J43.9). Coronary artery atherosclerosis. Electronically Signed   By: Danae Orleans M.D.   On: 11/17/2018 14:03        Scheduled Meds:  alteplase (tPA)  in NS 40mL for Dr.Oaks (intrapleural administration/[facility])   Intrapleural Once   aspirin EC  81 mg Oral Daily   feeding supplement (ENSURE ENLIVE)  237 mL Oral BID BM   folic acid  1 mg Oral Daily   multivitamin with minerals  1 tablet Oral Daily   nicotine  21 mg Transdermal Daily   thiamine  100 mg Oral Daily   Or   thiamine  100 mg Intravenous Daily   Continuous Infusions:  sodium chloride 250 mL (11/18/18 0613)   sodium chloride 75 mL/hr at 11/17/18 1457   ceFEPime (MAXIPIME) IV 2 g (11/18/18 0614)     LOS: 7 days    Time spent: 35 min    Burke Keels, MD [facility]  If 7PM-7AM, please contact night-coverage  11/18/2018, 12:22 PM

## 2018-11-18 NOTE — Plan of Care (Signed)
  Problem: Education: Goal: Knowledge of General Education information will improve Description: Including pain rating scale, medication(s)/side effects and non-pharmacologic comfort measures Outcome: Progressing   Problem: Clinical Measurements: Goal: Respiratory complications will improve Outcome: Progressing   Problem: Pain Managment: Goal: General experience of comfort will improve Outcome: Progressing   

## 2018-11-18 NOTE — Progress Notes (Signed)
     [address]       [place] [address]             [phone]       Images reviewed and case discussed with primary team.  Small bilateral effusion on CT from 11/1.  Left effusion is much improved from 10/26.  Given that he is on room air, and only has a small effusion on the left, surgical decortication will likely not yield much improvement.  Recs: - continue to treat with abx, and chest tube drainage - if he develops more fevers, will re-discuss transfer at that point.  Caira Poche Bary Leriche

## 2018-11-18 NOTE — Plan of Care (Signed)

## 2018-11-18 NOTE — Progress Notes (Signed)
There is no IV access. Pt is refusing the IV team to stick him. MDs are made aware. Pts chest tube drainage chamber changed.

## 2018-11-19 LAB — CBC WITH DIFFERENTIAL/PLATELET
Abs Immature Granulocytes: 0.18 10*3/uL — ABNORMAL HIGH (ref 0.00–0.07)
Basophils Absolute: 0.1 10*3/uL (ref 0.0–0.1)
Basophils Relative: 1 %
Eosinophils Absolute: 0.1 10*3/uL (ref 0.0–0.5)
Eosinophils Relative: 1 %
HCT: 34.5 % — ABNORMAL LOW (ref 39.0–52.0)
Hemoglobin: 11.3 g/dL — ABNORMAL LOW (ref 13.0–17.0)
Immature Granulocytes: 1 %
Lymphocytes Relative: 9 %
Lymphs Abs: 1.5 10*3/uL (ref 0.7–4.0)
MCH: 31 pg (ref 26.0–34.0)
MCHC: 32.8 g/dL (ref 30.0–36.0)
MCV: 94.5 fL (ref 80.0–100.0)
Monocytes Absolute: 0.9 10*3/uL (ref 0.1–1.0)
Monocytes Relative: 5 %
Neutro Abs: 14.9 10*3/uL — ABNORMAL HIGH (ref 1.7–7.7)
Neutrophils Relative %: 83 %
Platelets: 755 10*3/uL — ABNORMAL HIGH (ref 150–400)
RBC: 3.65 MIL/uL — ABNORMAL LOW (ref 4.22–5.81)
RDW: 14.8 % (ref 11.5–15.5)
WBC: 17.7 10*3/uL — ABNORMAL HIGH (ref 4.0–10.5)
nRBC: 0 % (ref 0.0–0.2)

## 2018-11-19 LAB — BASIC METABOLIC PANEL
Anion gap: 9 (ref 5–15)
BUN: 6 mg/dL — ABNORMAL LOW (ref 8–23)
CO2: 25 mmol/L (ref 22–32)
Calcium: 8.6 mg/dL — ABNORMAL LOW (ref 8.9–10.3)
Chloride: 106 mmol/L (ref 98–111)
Creatinine, Ser: 0.88 mg/dL (ref 0.61–1.24)
GFR calc Af Amer: >60 mL/min (ref >60)
GFR calc non Af Amer: >60 mL/min (ref >60)
Glucose, Bld: 122 mg/dL — ABNORMAL HIGH (ref 70–99)
Potassium: 3.2 mmol/L — ABNORMAL LOW (ref 3.5–5.1)
Sodium: 140 mmol/L (ref 135–145)

## 2018-11-19 LAB — AEROBIC/ANAEROBIC CULTURE W GRAM STAIN (SURGICAL/DEEP WOUND)

## 2018-11-19 MED ORDER — POTASSIUM CHLORIDE CRYS ER 20 MEQ PO TBCR
40.0000 meq | EXTENDED_RELEASE_TABLET | Freq: Once | ORAL | Status: AC
  Administered 2018-11-19: 40 meq via ORAL
  Filled 2018-11-19: qty 2

## 2018-11-19 MED ORDER — ENOXAPARIN SODIUM 40 MG/0.4ML ~~LOC~~ SOLN
40.0000 mg | SUBCUTANEOUS | Status: DC
  Administered 2018-11-19 – 2018-11-23 (×5): 40 mg via SUBCUTANEOUS
  Filled 2018-11-19 (×6): qty 0.4

## 2018-11-19 NOTE — Progress Notes (Signed)
PROGRESS NOTE    Thomas Coffey  VOZ:[id number] DOB: [birth year] DOA: 11/11/2018 PCP: Patient, [name]   Brief Narrative:  Is a 63 year old white male with history of tobacco abuse presented to the hospital complaining of vague epigastric right upper quadrant and chest pain.  In the ER patient underwent blood work which showed acute kidney injury, abnormal LFTs, and ultrasound findings suggestive of suspected cholelithiasis with possible cholecystitis.  Is also known to be in A. fib RVR.  As part of his work-up in the ER he was found to have an elevated D-dimer and underwent a CTA of chest which was suggestive of a necrotizing left-sided pneumonia with empyema.  He was sent to the hospital service admitted for further eval and treatment..   Assessment & Plan:   Principal Problem:   Empyema lung ([place]) Active Problems:   Acute hepatitis   Atrial fibrillation with RVR (HCC)   Alcohol abuse   Tobacco dependence syndrome   Left sided empyema- meeting sepsis criteria on admission with tachycardia, leukocytosis, and lactic acidosis, all of which are improving. -s/p CT-guided left chest tube placement 10/27 -Cardiothoracic surgery following-patient will receivedhis 3rd round ofintrapleural thrombolytics10/31 with good response as noted by CT below -repeat CT chest November 1 showed significant improvement-discussed the case with cardiothoracic 11/2 recommendations for continued chest tube management and IV antibiotics. -If patient declines recommend transfer to [facility], Case has been discussed with [facility].  See note of 11/2 -Body fluid cultures grew out [place] 11/2-final pending -Continue cefepime -Blood cultures negative to date  Acute hepatitis-I agree,likelydue to ischemic hepatitis in the setting of sepsis vs alcoholic hepatitis. Improving. -RUQ ultrasound was suggestive of cholelithiasis and showed some possible gallbladder wall thickening, but  doubt cholecystitis without any right upper quadrant pain-patient still without pain -MRCP performed 10/27 with no evidence of biliary tract obstruction -Acute hepatitis panel was negative -Avoid hepatotoxins -Monitor CMP, LFTs continue to trend down  A. fib with RVR- patient had one episode on admission, likely precipitated byhispneumonia. He has been in NSR since then. CHADSVASc is a 0. -Noplans to place patient on long term anticoagulation -ECHO with EF 60-65%, mild LVH -Cardiac monitoring  Alcohol abuse- drinks 4 beers and 1/2 pint of liquor per day. -[place] 10/31, as patient has been asymptomatic for 5 days -Continue withMVI, thiamine, folate  Tobacco use -Nicotine patch daily -Provided tobacco cessation counseling greater than 10 minutes  DVT prophylaxis: SCD/Compression stockings  Code Status: Full code    Code Status Orders  (From admission, onward)         Start     Ordered   11/11/18 2353  Full code  Continuous     11/11/18 2355        Code Status History    This patient has a current code status but no historical code status.   Advance Care Planning Activity     Family Communication: Called son updated answered all questions Disposition Plan:Patient remained inpatient, chest tube in place, requires IV antibiotics, continue chest tube management, possible transfer to main campus for surgical intervention if patient declines although currently stable and improving. Patient not stable for discharge    Consults called: None Admission status: Inpatient   Consultants:   Cardiothoracic surgery  Procedures:  Dg Chest 1 View  Result Date: 11/16/2018 CLINICAL DATA:  Follow-up for empyema.  Left-sided chest tube. EXAM: CHEST  1 VIEW COMPARISON:  11/15/2018 and earlier exams. FINDINGS: Pigtail chest tube projects  over the left mid hemithorax, stable. There is hazy opacity from the left mid lung to the base, improved compared to the previous day's  exam. Right lung is clear. No pneumothorax. IMPRESSION: 1. Improved lung aeration since the previous day's exam. Left lung opacities have decreased. No new lung abnormalities. No pneumothorax. Electronically Signed   By: Amie Portland M.D.   On: 11/16/2018 10:27   Dg Chest 1 View  Result Date: 11/11/2018 CLINICAL DATA:  Chest pain EXAM: CHEST  1 VIEW COMPARISON:  07/15/2005 FINDINGS: Defibrillator pads overlie the left lower chest. Increased left basilar ill-defined opacity obscures the left hemidiaphragm may represent atelectasis and/or consolidation. Difficult to exclude left lower lung pneumonia. Right lung remains clear. Normal heart size and vascularity. No large effusion or pneumothorax. Trachea midline. Aorta atherosclerotic. Degenerative changes of the spine. IMPRESSION: Increased left lower lung ill-defined opacity compatible with atelectasis or developing airspace disease. Electronically Signed   By: Judie Petit.  Shick M.D.   On: 11/11/2018 19:28   Dg Chest 2 View  Result Date: 11/13/2018 CLINICAL DATA:  Status post left chest tube placement for drainage of empyema. EXAM: CHEST - 2 VIEW COMPARISON:  11/11/2018 FINDINGS: The heart size and mediastinal contours are within normal limits. Interval placement of left-sided pleural pigtail catheter with improved aeration of the left lung. No pneumothorax identified. Bibasilar atelectasis present. The visualized skeletal structures are unremarkable. IMPRESSION: No pneumothorax identified after placement of left-sided pleural pigtail catheter. Bibasilar atelectasis present with improved aeration of the left lung. Electronically Signed   By: Irish Lack M.D.   On: 11/13/2018 08:59   Ct Chest Wo Contrast  Result Date: 11/17/2018 CLINICAL DATA:  Follow-up left empyema status post chest tube drainage. EXAM: CT CHEST WITHOUT CONTRAST TECHNIQUE: Multidetector CT imaging of the chest was performed following the standard protocol without IV contrast. COMPARISON:   11/11/2018 FINDINGS: Cardiovascular: No acute findings. Aortic and coronary artery atherosclerosis. Mediastinum/Nodes: No masses or pathologically enlarged lymph nodes identified on this unenhanced exam. Lungs/Pleura: There has been placement of a left pleural pigtail drainage catheter since prior study with near complete resolution of left pleural fluid collection since previous study. Mild left lower lobe atelectasis has significantly decreased since previous study. A small right pleural effusion and mild dependent atelectasis have increased since previous study. No evidence of pulmonary consolidation or mass. Mild emphysema again noted. Upper Abdomen:  Unremarkable. Musculoskeletal:  No suspicious bone lesions. IMPRESSION: Near complete resolution of left pleural fluid collection and decreased left lower lobe atelectasis following placement of left pleural drainage catheter. Increased small right pleural effusion and mild dependent right lower lobe atelectasis. Aortic Atherosclerosis (ICD10-I70.0) and Emphysema (ICD10-J43.9). Coronary artery atherosclerosis. Electronically Signed   By: Danae Orleans M.D.   On: 11/17/2018 14:03   Ct Angio Chest Pe W And/or Wo Contrast  Result Date: 11/11/2018 CLINICAL DATA:  Abdominal pain and complex chest pain. EXAM: CT ANGIOGRAPHY CHEST CT ABDOMEN AND PELVIS WITH CONTRAST TECHNIQUE: Multidetector CT imaging of the chest was performed using the standard protocol during bolus administration of intravenous contrast. Multiplanar CT image reconstructions and MIPs were obtained to evaluate the vascular anatomy. Multidetector CT imaging of the abdomen and pelvis was performed using the standard protocol during bolus administration of intravenous contrast. CONTRAST:  12mL OMNIPAQUE IOHEXOL 350 MG/ML SOLN COMPARISON:  CT dated July 15, 2005 FINDINGS: CTA CHEST FINDINGS Cardiovascular: Contrast injection is sufficient to demonstrate satisfactory opacification of the pulmonary  arteries to the segmental level. There is no pulmonary embolus. The  main pulmonary artery is within normal limits for size. There is no CT evidence of acute right heart strain. The visualized aorta is normal. Heart size is normal, without pericardial effusion. Mediastinum/Nodes: --there is some mildly prominent mediastinal lymph nodes, likely reactive. --No axillary lymphadenopathy. --No supraclavicular lymphadenopathy. --Normal thyroid gland. --The esophagus is unremarkable Lungs/Pleura: There is a large complex left-sided empyema with multiple pockets of gas scattered throughout the collection both in a dependent and nondependent location. There is consolidation of the left lower lobe with findings raising concern for necrotizing pneumonia. Moderate emphysematous changes are noted bilaterally. There is a trace right-sided pleural effusion there is some mild interlobular septal thickening. There is some debris within the trachea. Musculoskeletal: No chest wall abnormality. No acute or significant osseous findings. Review of the MIP images confirms the above findings. CT ABDOMEN and PELVIS FINDINGS Hepatobiliary: There is decreased hepatic attenuation suggestive of hepatic steatosis. Normal gallbladder.There is no biliary ductal dilation. Pancreas: Normal contours without ductal dilatation. No peripancreatic fluid collection. Spleen: No splenic laceration or hematoma. Adrenals/Urinary Tract: --Adrenal glands: No adrenal hemorrhage. --Right kidney/ureter: No hydronephrosis or perinephric hematoma. --Left kidney/ureter: No hydronephrosis or perinephric hematoma. --Urinary bladder: Unremarkable. Stomach/Bowel: --Stomach/Duodenum: There is moderate distention of the stomach with an air-fluid level --Small bowel: No dilatation or inflammation. --Colon: The patient is status post right hemicolectomy. --Appendix: Surgically absent. Vascular/Lymphatic: Normal course and caliber of the major abdominal vessels. --No  retroperitoneal lymphadenopathy. --No mesenteric lymphadenopathy. --No pelvic or inguinal lymphadenopathy. Reproductive: Unremarkable Other: No ascites or free air. The abdominal wall is normal. Musculoskeletal. No acute displaced fractures. Review of the MIP images confirms the above findings. IMPRESSION: 1. Large complex left-sided empyema. 2. Consolidation of the left lower lobe with findings raising concern for necrotizing pneumonia. 3. Trace right-sided pleural effusion. 4. No acute intra-abdominal or pelvic process. 5. Hepatic steatosis. 6. Moderate distention of the stomach. 7. Status post right hemicolectomy. Aortic Atherosclerosis (ICD10-I70.0) and Emphysema (ICD10-J43.9). Electronically Signed   By: Katherine Mantle M.D.   On: 11/11/2018 21:37   Ct Abdomen Pelvis W Contrast  Result Date: 11/11/2018 CLINICAL DATA:  Abdominal pain and complex chest pain. EXAM: CT ANGIOGRAPHY CHEST CT ABDOMEN AND PELVIS WITH CONTRAST TECHNIQUE: Multidetector CT imaging of the chest was performed using the standard protocol during bolus administration of intravenous contrast. Multiplanar CT image reconstructions and MIPs were obtained to evaluate the vascular anatomy. Multidetector CT imaging of the abdomen and pelvis was performed using the standard protocol during bolus administration of intravenous contrast. CONTRAST:  60mL OMNIPAQUE IOHEXOL 350 MG/ML SOLN COMPARISON:  CT dated July 15, 2005 FINDINGS: CTA CHEST FINDINGS Cardiovascular: Contrast injection is sufficient to demonstrate satisfactory opacification of the pulmonary arteries to the segmental level. There is no pulmonary embolus. The main pulmonary artery is within normal limits for size. There is no CT evidence of acute right heart strain. The visualized aorta is normal. Heart size is normal, without pericardial effusion. Mediastinum/Nodes: --there is some mildly prominent mediastinal lymph nodes, likely reactive. --No axillary lymphadenopathy. --No  supraclavicular lymphadenopathy. --Normal thyroid gland. --The esophagus is unremarkable Lungs/Pleura: There is a large complex left-sided empyema with multiple pockets of gas scattered throughout the collection both in a dependent and nondependent location. There is consolidation of the left lower lobe with findings raising concern for necrotizing pneumonia. Moderate emphysematous changes are noted bilaterally. There is a trace right-sided pleural effusion there is some mild interlobular septal thickening. There is some debris within the trachea. Musculoskeletal: No chest wall abnormality. No  acute or significant osseous findings. Review of the MIP images confirms the above findings. CT ABDOMEN and PELVIS FINDINGS Hepatobiliary: There is decreased hepatic attenuation suggestive of hepatic steatosis. Normal gallbladder.There is no biliary ductal dilation. Pancreas: Normal contours without ductal dilatation. No peripancreatic fluid collection. Spleen: No splenic laceration or hematoma. Adrenals/Urinary Tract: --Adrenal glands: No adrenal hemorrhage. --Right kidney/ureter: No hydronephrosis or perinephric hematoma. --Left kidney/ureter: No hydronephrosis or perinephric hematoma. --Urinary bladder: Unremarkable. Stomach/Bowel: --Stomach/Duodenum: There is moderate distention of the stomach with an air-fluid level --Small bowel: No dilatation or inflammation. --Colon: The patient is status post right hemicolectomy. --Appendix: Surgically absent. Vascular/Lymphatic: Normal course and caliber of the major abdominal vessels. --No retroperitoneal lymphadenopathy. --No mesenteric lymphadenopathy. --No pelvic or inguinal lymphadenopathy. Reproductive: Unremarkable Other: No ascites or free air. The abdominal wall is normal. Musculoskeletal. No acute displaced fractures. Review of the MIP images confirms the above findings. IMPRESSION: 1. Large complex left-sided empyema. 2. Consolidation of the left lower lobe with findings  raising concern for necrotizing pneumonia. 3. Trace right-sided pleural effusion. 4. No acute intra-abdominal or pelvic process. 5. Hepatic steatosis. 6. Moderate distention of the stomach. 7. Status post right hemicolectomy. Aortic Atherosclerosis (ICD10-I70.0) and Emphysema (ICD10-J43.9). Electronically Signed   By: Katherine Mantle M.D.   On: 11/11/2018 21:37   US Renal  Result Date: 11/13/2018 CLINICAL DATA:  Acute kidney injury EXAM: RENAL / URINARY TRACT ULTRASOUND COMPLETE COMPARISON:  CT 11/11/2018 FINDINGS: Right Kidney: Renal measurements: 13.7 x 9.5 x 5.9 cm = volume: 231 mL. Mildly increased echotexture throughout the right kidney. No mass or hydronephrosis. Left Kidney: Renal measurements: 12.8 x 5.9 x 5.1 cm = volume: 200 mL. Echogenicity within normal limits. No mass or hydronephrosis visualized. Bladder: Appears normal for degree of bladder distention. Other: IMPRESSION: Increased echotexture in the kidneys bilaterally suggesting medical renal disease. No hydronephrosis. Electronically Signed   By: Charlett Nose M.D.   On: 11/13/2018 09:07   Mr Abdomen Mrcp Wo Contrast  Result Date: 11/12/2018 CLINICAL DATA:  63 year old male with history of abnormal liver function tests. Cholelithiasis. Right upper quadrant abdominal pain. EXAM: MRI ABDOMEN WITHOUT CONTRAST  (INCLUDING MRCP) TECHNIQUE: Multiplanar multisequence MR imaging of the abdomen was performed. Heavily T2-weighted images of the biliary and pancreatic ducts were obtained, and three-dimensional MRCP images were rendered by post processing. COMPARISON:  No prior abdominal MRI. CT the abdomen and pelvis 11/11/2018. FINDINGS: Comment: Study is limited for detection and characterization of visceral and/or vascular lesions by lack of IV gadolinium. Lower chest: Trace right pleural effusion. Complex multilocular left pleural effusion with probable atelectasis in the left lower lobe. Hepatobiliary: Mild diffuse loss of signal intensity  throughout the hepatic parenchyma on out of phase dual echo images, indicative of mild hepatic steatosis. No discrete cystic or solid hepatic lesions are confidently identified on today's noncontrast examination. Gallbladder is normal in appearance. No intra or extrahepatic biliary ductal dilatation noted on MRCP images. Common bile duct is normal in caliber measuring 5 mm in the porta hepatis. No filling defects in the common bile duct to suggest choledocholithiasis. Pancreas: No definite pancreatic mass identified on today's noncontrast examination. No pancreatic ductal dilatation noted on MRCP images. No pancreatic or peripancreatic fluid collections or inflammatory changes. Spleen:  Unremarkable. Adrenals/Urinary Tract: Unenhanced appearance of the kidneys and bilateral adrenal glands is unremarkable. No hydroureteronephrosis in the visualized portions of the abdomen. Stomach/Bowel: Visualized portions are unremarkable. Vascular/Lymphatic: Aortic atherosclerosis without definite aneurysm in the abdomen on today's noncontrast examination. No lymphadenopathy noted  in the abdomen. Other: No significant volume of ascites in the visualized portions of the peritoneal cavity. Trace amount of perinephric fluid bilaterally (nonspecific). Musculoskeletal: No aggressive appearing osseous lesions are noted in the visualized portions of the skeleton. IMPRESSION: 1. No findings to suggest biliary tract obstruction. No choledocholithiasis. 2. Mild hepatic steatosis. 3. Trace right pleural effusion and large complex multilocular left pleural effusion with probable atelectasis in the left lower lobe. 4. Aortic atherosclerosis. Electronically Signed   By: Trudie Reed M.D.   On: 11/12/2018 10:26   Mr 3d Recon At Scanner  Result Date: 11/12/2018 CLINICAL DATA:  63 year old male with history of abnormal liver function tests. Cholelithiasis. Right upper quadrant abdominal pain. EXAM: MRI ABDOMEN WITHOUT CONTRAST  (INCLUDING  MRCP) TECHNIQUE: Multiplanar multisequence MR imaging of the abdomen was performed. Heavily T2-weighted images of the biliary and pancreatic ducts were obtained, and three-dimensional MRCP images were rendered by post processing. COMPARISON:  No prior abdominal MRI. CT the abdomen and pelvis 11/11/2018. FINDINGS: Comment: Study is limited for detection and characterization of visceral and/or vascular lesions by lack of IV gadolinium. Lower chest: Trace right pleural effusion. Complex multilocular left pleural effusion with probable atelectasis in the left lower lobe. Hepatobiliary: Mild diffuse loss of signal intensity throughout the hepatic parenchyma on out of phase dual echo images, indicative of mild hepatic steatosis. No discrete cystic or solid hepatic lesions are confidently identified on today's noncontrast examination. Gallbladder is normal in appearance. No intra or extrahepatic biliary ductal dilatation noted on MRCP images. Common bile duct is normal in caliber measuring 5 mm in the porta hepatis. No filling defects in the common bile duct to suggest choledocholithiasis. Pancreas: No definite pancreatic mass identified on today's noncontrast examination. No pancreatic ductal dilatation noted on MRCP images. No pancreatic or peripancreatic fluid collections or inflammatory changes. Spleen:  Unremarkable. Adrenals/Urinary Tract: Unenhanced appearance of the kidneys and bilateral adrenal glands is unremarkable. No hydroureteronephrosis in the visualized portions of the abdomen. Stomach/Bowel: Visualized portions are unremarkable. Vascular/Lymphatic: Aortic atherosclerosis without definite aneurysm in the abdomen on today's noncontrast examination. No lymphadenopathy noted in the abdomen. Other: No significant volume of ascites in the visualized portions of the peritoneal cavity. Trace amount of perinephric fluid bilaterally (nonspecific). Musculoskeletal: No aggressive appearing osseous lesions are noted in  the visualized portions of the skeleton. IMPRESSION: 1. No findings to suggest biliary tract obstruction. No choledocholithiasis. 2. Mild hepatic steatosis. 3. Trace right pleural effusion and large complex multilocular left pleural effusion with probable atelectasis in the left lower lobe. 4. Aortic atherosclerosis. Electronically Signed   By: Trudie Reed M.D.   On: 11/12/2018 10:26   Dg Chest Port 1 View  Result Date: 11/15/2018 CLINICAL DATA:  Chest tube in place EXAM: PORTABLE CHEST 1 VIEW COMPARISON:  11/13/2018 FINDINGS: Left-sided chest tube remains in position without significant pneumothorax. Slight interval increase in heterogeneous and interstitial opacity of the left lung. The right lung remains normally aerated. No focal airspace opacity. Heart and mediastinum are normal. IMPRESSION: Left-sided chest tube remains in position without significant pneumothorax. Slight interval increase in heterogeneous and interstitial opacity of the left lung. The right lung remains normally aerated. No focal airspace opacity Electronically Signed   By: Lauralyn Primes M.D.   On: 11/15/2018 08:42   Ct Image Guided Drainage By Percutaneous Catheter  Result Date: 11/12/2018 INDICATION: 63 year old with left chest empyema. EXAM: CT-GUIDED PLACEMENT OF LEFT CHEST TUBE MEDICATIONS: Moderate sedation ANESTHESIA/SEDATION: 2.0 mg IV Versed 100 mcg IV Fentanyl Moderate  Sedation Time:  15 minutes The patient was continuously monitored during the procedure by the interventional radiology nurse under my direct supervision. COMPLICATIONS: None immediate. TECHNIQUE: Informed written consent was obtained from the patient after a thorough discussion of the procedural risks, benefits and alternatives. All questions were addressed. A timeout was performed prior to the initiation of the procedure. PROCEDURE: Patient was placed on his right side. Images through the chest were obtained. Left side of the chest was prepped with  chlorhexidine and sterile field was created. Skin and soft tissues were anesthetized with 1% lidocaine. Using CT guidance, a Yueh catheter was directed into the pleural space and cloudy yellow fluid was aspirated. Stiff Amplatz wire was advanced into the pleural space. The tract was dilated to accommodate a 14 [place] multipurpose drain. Additional cloudy yellow fluid was collected and sent for culture. Catheter was sutured to skin and attached to a PleurEvac collection device. Dressing was placed over the chest tube. FINDINGS: Complex left pleural effusion containing pockets of gas. Consolidation and volume loss in left lung. Cloudy yellow fluid was removed from the left pleural space. 14 French catheter positioned within the left pleural space. IMPRESSION: CT-guided placement of a left chest tube. Electronically Signed   By: Richarda Overlie M.D.   On: 11/12/2018 16:26   US Abdomen Limited Ruq  Result Date: 11/11/2018 CLINICAL DATA:  Pain EXAM: ULTRASOUND ABDOMEN LIMITED RIGHT UPPER QUADRANT COMPARISON:  None. FINDINGS: Gallbladder: Multiple gallstones are noted. There is gallbladder wall thickening with the gallbladder wall measuring approximately 4 mm. The sonographic Eulah Pont sign is negative. There is no pericholecystic free fluid. Common bile duct: Diameter: 4 mm Liver: No focal lesion identified. Within normal limits in parenchymal echogenicity. Portal vein is patent on color Doppler imaging with normal direction of blood flow towards the liver. Other: None. IMPRESSION: Cholelithiasis with mild gallbladder wall thickening. In the absence of a positive sonographic Murphy sign, these findings are equivocal for acute cholecystitis. If there is high clinical suspicion for acute cholecystitis, follow-up with HIDA scan is recommended. Electronically Signed   By: Katherine Mantle M.D.   On: 11/11/2018 20:28     Antimicrobials:   Cefepime   Subjective: Patient continues to improve No complaints  overnight Patient is about when chest tube will come out to go home-deferred to cardiothoracic surgery  Objective: Vitals:   11/18/18 1639 11/18/18 1916 11/19/18 0317 11/19/18 0742  BP: 115/73 136/78 127/73 129/77  Pulse: 98 98 98 91  Resp: Temp: 98.5 F (36.9 C) 98.3 F (36.8 C) 99.1 F (37.3 C) 98.1 F (36.7 C)  TempSrc:  Oral Oral Oral  SpO2: 100% 97% 100% 96%  Weight:   72.4 kg   Height:        Intake/Output Summary (Last 24 hours) at 11/19/2018 0935 Last data filed at 11/19/2018 0636 Gross per 24 hour  Intake 1822.44 ml  Output 580 ml  Net 1242.44 ml   Filed Weights   11/17/18 0452 11/18/18 0458 11/19/18 0317  Weight: 73.2 kg 70.9 kg 72.4 kg    Examination:  General exam: Appears calm and comfortable  Respiratory system: Rales bilaterally, trace rhonchi, chest tube site left chest wall without signs of infection did not take taping down Cardiovascular system: S1 & S2 heard, RRR. No JVD, murmurs, rubs, gallops or clicks. No pedal edema. Gastrointestinal system: Abdomen is nondistended, soft and nontender. No organomegaly or masses felt. Normal bowel sounds heard. Central nervous system: Alert and oriented. No  focal neurological deficits. Extremities: Warm well perfused, no edema neurovascular intact Skin: No rashes, lesions or ulcers Psychiatry: Judgement and insight appear normal. Mood & affect appropriate.     Data Reviewed: I have personally reviewed following labs and imaging studies  CBC: Recent Labs  Lab 11/14/18 0520 11/15/18 0753 11/16/18 0531 11/17/18 0446 11/19/18 0918  WBC 21.8* 18.2* 18.0* 15.3* 17.7*  NEUTROABS  --   --   --   --  14.9*  HGB 11.2* 11.0* 11.1* 10.6* 11.3*  HCT 32.6* 32.8* 33.2* 32.1* 34.5*  MCV 90.8 92.9 92.2 94.1 94.5  PLT 335 357 414* 489* 755*   Basic Metabolic Panel: Recent Labs  Lab 11/12/18 1635  11/13/18 0455 11/14/18 0520 11/15/18 0753 11/16/18 0531 11/17/18 0446 11/18/18 0440  NA  --   --  135  138 137 139 140  --   K  --   --  3.5 3.8 3.4* 3.7 3.3*  --   CL  --   --  105 108 106 105 106  --   CO2  --   --  22 24 23 26 24   --   GLUCOSE  --   --  120* 102* 143* 110* 104*  --   BUN  --   --  52* 31* 13 8 7*  --   CREATININE  --    < > 2.06* 1.30* 0.95 0.79 0.81 0.89  CALCIUM  --   --  8.0* 8.1* 8.4* 8.6* 8.2*  --   MG 2.7*  --   --   --   --   --   --   --   PHOS 5.4*  --   --   --   --   --   --   --    < > = values in this interval not displayed.   GFR: Estimated Creatinine Clearance: 85 mL/min (by C-G formula based on SCr of 0.89 mg/dL). Liver Function Tests: Recent Labs  Lab 11/13/18 0455 11/14/18 0520 11/15/18 0753 11/16/18 0531 11/17/18 0446  AST 1,488* 637* 200* 84* 55*  ALT 517* 338* 210* 139* 93*  ALKPHOS 100 160* 160* 158* 125  BILITOT 1.8* 1.3* 0.9 1.1 1.5*  PROT 5.2* 5.3* 5.5* 5.6* 5.2*  ALBUMIN 2.1* 2.0* 2.1* 2.0* 1.9*   No results for input(s): LIPASE, AMYLASE in the last 168 hours. No results for input(s): AMMONIA in the last 168 hours. Coagulation Profile: Recent Labs  Lab 11/12/18 1635  INR 1.9*   Cardiac Enzymes: No results for input(s): CKTOTAL, CKMB, CKMBINDEX, TROPONINI in the last 168 hours. BNP (last 3 results) No results for input(s): PROBNP in the last 8760 hours. HbA1C: No results for input(s): HGBA1C in the last 72 hours. CBG: No results for input(s): GLUCAP in the last 168 hours. Lipid Profile: No results for input(s): CHOL, HDL, LDLCALC, TRIG, CHOLHDL, LDLDIRECT in the last 72 hours. Thyroid Function Tests: No results for input(s): TSH, T4TOTAL, FREET4, T3FREE, THYROIDAB in the last 72 hours. Anemia Panel: No results for input(s): VITAMINB12, FOLATE, FERRITIN, TIBC, IRON, RETICCTPCT in the last 72 hours. Sepsis Labs: Recent Labs  Lab 11/12/18 1635 11/13/18 0947 11/14/18 0755  LATICACIDVEN 2.2* 2.3* 1.5    Recent Results (from the past 240 hour(s))  Blood Culture (routine x 2)     Status: None   Collection Time:  11/11/18  6:15 PM   Specimen: BLOOD  Result Value Ref Range Status   Specimen Description BLOOD BLOOD LEFT WRIST  Final   Special Requests   Final    BOTTLES DRAWN AEROBIC AND ANAEROBIC Blood Culture adequate volume   Culture   Final    NO GROWTH 5 DAYS Performed at [facility], [address] [place], [place] [address]    Report Status 11/16/2018 FINAL  Final  SARS CORONAVIRUS 2 (TAT 6-24 HRS) Nasopharyngeal Nasopharyngeal Swab     Status: None   Collection Time: 11/11/18 10:41 PM   Specimen: Nasopharyngeal Swab  Result Value Ref Range Status   SARS Coronavirus 2 NEGATIVE NEGATIVE Final    Comment: (NOTE) SARS-CoV-2 target nucleic acids are NOT DETECTED. The SARS-CoV-2 RNA is generally detectable in upper and lower respiratory specimens during the acute phase of infection. Negative results do not preclude SARS-CoV-2 infection, do not rule out co-infections with other pathogens, and should not be used as the sole basis for treatment or other patient management decisions. Negative results must be combined with clinical observations, patient history, and epidemiological information. The expected result is Negative. Fact Sheet for Patients: HairSlick.nohttps://www.fda.gov/media/138098/download Fact Sheet for Healthcare Providers: quierodirigir.comhttps://www.fda.gov/media/138095/download This test is not yet approved or cleared by the [place] FDA and  has been authorized for detection and/or diagnosis of SARS-CoV-2 by FDA under an Emergency Use Authorization (EUA). This EUA will remain  in effect (meaning this test can be used) for the duration of the COVID-19 declaration under Section 56 4(b)(1) of the Act, 21 U.S.C. section 360bbb-3(b)(1), unless the authorization is terminated or revoked sooner. Performed at [facility], [address] [place], [place] [address]   Blood Culture (routine x 2)     Status: None   Collection Time: 11/11/18 11:45 PM   Specimen: BLOOD  Result Value  Ref Range Status   Specimen Description BLOOD BLOOD LEFT WRIST  Final   Special Requests   Final    BOTTLES DRAWN AEROBIC AND ANAEROBIC Blood Culture adequate volume   Culture   Final    NO GROWTH 5 DAYS Performed at [facility], [address] [place], [place] [address]    Report Status 11/16/2018 FINAL  Final  MRSA PCR Screening     Status: None   Collection Time: 11/12/18  7:50 AM   Specimen: Nasal Mucosa; Nasopharyngeal  Result Value Ref Range Status   MRSA by PCR NEGATIVE NEGATIVE Final    Comment:        The GeneXpert MRSA Assay (FDA approved for NASAL specimens only), is one component of a comprehensive MRSA colonization surveillance program. It is not intended to diagnose MRSA infection nor to guide or monitor treatment for MRSA infections. Performed at [facility], [address] [place], [place] [address]   Aerobic/Anaerobic Culture (surgical/deep wound)     Status: None (Preliminary result)   Collection Time: 11/12/18  5:08 PM   Specimen: Pleural Fluid  Result Value Ref Range Status   Specimen Description   Final    PLEURAL LEFT PLEURAL FLUID Performed at [facility], [address] [place], [place] [address]    Special Requests   Final    NONE Performed at [facility], [address] [place], [place] [address]    Gram Stain   Final    WBC PRESENT,BOTH PMN AND MONONUCLEAR NO ORGANISMS SEEN CYTOSPIN SMEAR    Culture   Final    RARE GRAM POSITIVE COCCI CRITICAL RESULT CALLED TO, READ BACK BY AND VERIFIED WITH: RN DARLENE LISTOPAD [id number] FCP Performed at [facility], [address] [place], [place] [address]    Report Status PENDING  Incomplete  CULTURE, BLOOD (ROUTINE X 2) w Reflex to ID Panel     Status: None (Preliminary result)   Collection Time: 11/16/18  5:12 PM   Specimen: BLOOD  Result Value Ref Range Status   Specimen Description BLOOD BLOOD RIGHT WRIST  Final   Special Requests   Final     BOTTLES DRAWN AEROBIC AND ANAEROBIC Blood Culture adequate volume   Culture   Final    NO GROWTH 3 DAYS Performed at [facility], [address] [place], [place] [address]    Report Status PENDING  Incomplete  CULTURE, BLOOD (ROUTINE X 2) w Reflex to ID Panel     Status: None (Preliminary result)   Collection Time: 11/16/18  5:27 PM   Specimen: BLOOD  Result Value Ref Range Status   Specimen Description BLOOD BLOOD LEFT WRIST  Final   Special Requests   Final    BOTTLES DRAWN AEROBIC AND ANAEROBIC Blood Culture results may not be optimal due to an excessive volume of blood received in culture bottles   Culture   Final    NO GROWTH 3 DAYS Performed at [facility], [address] [place], [place] [address]    Report Status PENDING  Incomplete         Radiology Studies: Ct Chest Wo Contrast  Result Date: 11/17/2018 CLINICAL DATA:  Follow-up left empyema status post chest tube drainage. EXAM: CT CHEST WITHOUT CONTRAST TECHNIQUE: Multidetector CT imaging of the chest was performed following the standard protocol without IV contrast. COMPARISON:  11/11/2018 FINDINGS: Cardiovascular: No acute findings. Aortic and coronary artery atherosclerosis. Mediastinum/Nodes: No masses or pathologically enlarged lymph nodes identified on this unenhanced exam. Lungs/Pleura: There has been placement of a left pleural pigtail drainage catheter since prior study with near complete resolution of left pleural fluid collection since previous study. Mild left lower lobe atelectasis has significantly decreased since previous study. A small right pleural effusion and mild dependent atelectasis have increased since previous study. No evidence of pulmonary consolidation or mass. Mild emphysema again noted. Upper Abdomen:  Unremarkable. Musculoskeletal:  No suspicious bone lesions. IMPRESSION: Near complete resolution of left pleural fluid collection and decreased left lower lobe atelectasis  following placement of left pleural drainage catheter. Increased small right pleural effusion and mild dependent right lower lobe atelectasis. Aortic Atherosclerosis (ICD10-I70.0) and Emphysema (ICD10-J43.9). Coronary artery atherosclerosis. Electronically Signed   By: Danae OrleansJohn A Stahl M.D.   On: 11/17/2018 14:03        Scheduled Meds:  alteplase (tPA) 10mg  in NS 40mL for Dr.Oaks (intrapleural administration/[facility])   Intrapleural Once   aspirin EC  81 mg Oral Daily   feeding supplement (ENSURE ENLIVE)  237 mL Oral BID BM   folic acid  1 mg Oral Daily   multivitamin with minerals  1 tablet Oral Daily   nicotine  21 mg Transdermal Daily   thiamine  100 mg Oral Daily   Or   thiamine  100 mg Intravenous Daily   Continuous Infusions:  sodium chloride 75 mL/hr at 11/18/18 1859   sodium chloride 75 mL/hr at 11/19/18 0636   ceFEPime (MAXIPIME) IV Stopped (11/19/18 0615)     LOS: 8 days    Time spent: 35 min    Burke Keelshristopher Brion Hedges, MD [facility]  If 7PM-7AM, please contact night-coverage  11/19/2018, 9:35 AM

## 2018-11-20 ENCOUNTER — Inpatient Hospital Stay: Payer: Medicaid Other

## 2018-11-20 DIAGNOSIS — I4891 Unspecified atrial fibrillation: Secondary | ICD-10-CM | POA: Diagnosis present

## 2018-11-20 LAB — BASIC METABOLIC PANEL
Anion gap: 10 (ref 5–15)
BUN: 5 mg/dL — ABNORMAL LOW (ref 8–23)
CO2: 22 mmol/L (ref 22–32)
Calcium: 8.3 mg/dL — ABNORMAL LOW (ref 8.9–10.3)
Chloride: 107 mmol/L (ref 98–111)
Creatinine, Ser: 0.77 mg/dL (ref 0.61–1.24)
GFR calc Af Amer: >60 mL/min (ref >60)
GFR calc non Af Amer: >60 mL/min (ref >60)
Glucose, Bld: 103 mg/dL — ABNORMAL HIGH (ref 70–99)
Potassium: 3.4 mmol/L — ABNORMAL LOW (ref 3.5–5.1)
Sodium: 139 mmol/L (ref 135–145)

## 2018-11-20 LAB — CBC WITH DIFFERENTIAL/PLATELET
Abs Immature Granulocytes: 0.2 10*3/uL — ABNORMAL HIGH (ref 0.00–0.07)
Basophils Absolute: 0.1 10*3/uL (ref 0.0–0.1)
Basophils Relative: 1 %
Eosinophils Absolute: 0.1 10*3/uL (ref 0.0–0.5)
Eosinophils Relative: 0 %
HCT: 29.5 % — ABNORMAL LOW (ref 39.0–52.0)
Hemoglobin: 9.7 g/dL — ABNORMAL LOW (ref 13.0–17.0)
Immature Granulocytes: 1 %
Lymphocytes Relative: 10 %
Lymphs Abs: 1.9 10*3/uL (ref 0.7–4.0)
MCH: 30.5 pg (ref 26.0–34.0)
MCHC: 32.9 g/dL (ref 30.0–36.0)
MCV: 92.8 fL (ref 80.0–100.0)
Monocytes Absolute: 1.5 10*3/uL — ABNORMAL HIGH (ref 0.1–1.0)
Monocytes Relative: 8 %
Neutro Abs: 15.7 10*3/uL — ABNORMAL HIGH (ref 1.7–7.7)
Neutrophils Relative %: 80 %
Platelets: 696 10*3/uL — ABNORMAL HIGH (ref 150–400)
RBC: 3.18 MIL/uL — ABNORMAL LOW (ref 4.22–5.81)
RDW: 14.7 % (ref 11.5–15.5)
WBC: 19.5 10*3/uL — ABNORMAL HIGH (ref 4.0–10.5)
nRBC: 0 % (ref 0.0–0.2)

## 2018-11-20 MED ORDER — POTASSIUM CHLORIDE CRYS ER 20 MEQ PO TBCR
40.0000 meq | EXTENDED_RELEASE_TABLET | Freq: Once | ORAL | Status: AC
  Administered 2018-11-20: 40 meq via ORAL
  Filled 2018-11-20: qty 2

## 2018-11-20 MED ORDER — SODIUM CHLORIDE 0.9 % IV SOLN
2.0000 g | INTRAVENOUS | Status: AC
  Administered 2018-11-20 – 2018-11-21 (×2): 2 g via INTRAVENOUS
  Filled 2018-11-20: qty 20
  Filled 2018-11-20: qty 2
  Filled 2018-11-20: qty 20
  Filled 2018-11-20: qty 2

## 2018-11-20 NOTE — Progress Notes (Signed)
PROGRESS NOTE    Thomas LickClinton D Coffey  ZOX:[id number] DOB: [birth year] DOA: 11/11/2018 PCP: Patient, [name]    Brief Narrative:  Is a 63 year old white male with history of tobacco and alcohol abuse presented to the hospital complaining of vague epigastric right upper quadrant and chest pain. In the ER patient underwent blood work which showed acute kidney injury, abnormal LFTs, and ultrasound findings suggestive of suspected cholelithiasis with possible cholecystitis. Is also known to be in A. fib RVR. As part of his work-up in the ER he was found to have an elevated D-dimer and underwent a CTA of chest which was suggestive of a necrotizing left-sided pneumonia with empyema. He was sent to the hospital service admitted for further eval and treatment.  Interim History: -Pleural fluid culture grow RARE VIRIDANS STREPTOCOCCUS. Will narrow down abx from cefepime to rocephine. Bx negative so far.  Assessment & Plan:   Principal Problem:   Empyema lung (HCC) Active Problems:   Acute hepatitis   Atrial fibrillation with RVR (HCC)   Alcohol abuse   Tobacco dependence syndrome   Left sided empyema- meeting sepsis criteria on admission with tachycardia, leukocytosis, and lactic acidosis, all of which are improving. -s/p CT-guided left chest tube placement 10/27 -Cardiothoracic surgery following-patient will receivedhis 3rd round ofintrapleural thrombolytics10/31 -  PA from Surgery saw pt on 11/4 -repeat CT chest November 1 showed significant improvement-discussed the case with cardiothoracic recommendations for continued chest tube management and IV antibiotics. -If patient declines [facility] with [facility]. -Body fluid cultures positive for RARE VIRIDANS STREPTOCOCCUS. -narrow down from cefepime to rocephine -Blood cultures with no growth  Acute hepatitis:likelydue to ischemic hepatitis in the setting of  sepsis vs alcoholic hepatitis. Improving.GI was consulted. -RUQ ultrasound was suggestive of cholelithiasis and showed some possible gallbladder wall thickening, but doubt cholecystitis without any right upper quadrant pain-patient still without pain -MRCP performed 10/27 with no evidence of biliary tract obstruction -Acute hepatitis panel was negative -Avoid hepatotoxins -Monitor CMP  A. fib with RVR- patient had one episode on admission, likely precipitated byhispneumonia. He has been in NSR since then. CHADSVASc is a 0. -Noplans to place patient on long term anticoagulation -ECHO with EF 60-65%, mild LVH -Cardiac monitoring -on ASA  Alcohol abuse- drinks 4 beers and 1/2 pint of liquor per day. -StoppedCIWA 10/31, as patient has been asymptomatic for 5 days -Continue withMVI, thiamine, folate  Tobacco use -Nicotine patch daily -Provided tobacco cessation counseling   DVT prophylaxis: Lovenos  Code Status: fall Family Communication: none.  Disposition Plan: not ready for discharge. Still has chest tube with draining.   Consultants:   CT surgeon  surgery  Procedures:  S/p of MRCP  S/p of  CT-guided left chest tube placement 10/27  Antimicrobials: Anti-infectives (From admission, onward)   Start     Dose/Rate Route Frequency Ordered Stop   11/20/18 1400  cefTRIAXone (ROCEPHIN) 2 g in sodium chloride 0.9 % 100 mL IVPB     2 g 200 mL/hr over 30 Minutes Intravenous Every 24 hours 11/20/18 1222     11/17/18 1745  levofloxacin (LEVAQUIN) tablet 750 mg     750 mg Oral  Once 11/17/18 1733 11/17/18 1843   11/17/18 0600  vancomycin (VANCOCIN) IVPB 1000 mg/200 mL premix  Status:  Discontinued     1,000 mg 200 mL/hr over 60 Minutes Intravenous Every 12 hours 11/16/18 1848 11/17/18 1319   11/16/18 1800  vancomycin (VANCOCIN) 1,500 mg in sodium chloride 0.9 % 500 mL IVPB     1,500 mg 250 mL/hr over 120 Minutes Intravenous  Once 11/16/18 1717 11/16/18 2023   11/15/18  2200  ceFEPIme (MAXIPIME) 2 g in sodium chloride 0.9 % 100 mL IVPB  Status:  Discontinued     2 g 200 mL/hr over 30 Minutes Intravenous Every 8 hours 11/15/18 1246 11/20/18 1222   11/14/18 1200  ceFEPIme (MAXIPIME) 2 g in sodium chloride 0.9 % 100 mL IVPB  Status:  Discontinued     2 g 200 mL/hr over 30 Minutes Intravenous Every 12 hours 11/14/18 0801 11/15/18 1246   11/13/18 1000  vancomycin (VANCOCIN) 1,250 mg in sodium chloride 0.9 % 250 mL IVPB  Status:  Discontinued     1,250 mg 166.7 mL/hr over 90 Minutes Intravenous Every 36 hours 11/12/18 0340 11/12/18 0759   11/12/18 0759  vancomycin variable dose per unstable renal function (pharmacist dosing)  Status:  Discontinued      Does not apply See admin instructions 11/12/18 0759 11/12/18 1501   11/12/18 0600  piperacillin-tazobactam (ZOSYN) IVPB 3.375 g  Status:  Discontinued     3.375 g 12.5 mL/hr over 240 Minutes Intravenous Every 8 hours 11/12/18 0332 11/14/18 0740   11/11/18 2230  piperacillin-tazobactam (ZOSYN) IVPB 3.375 g     3.375 g 100 mL/hr over 30 Minutes Intravenous STAT 11/11/18 2205 11/11/18 2257   11/11/18 2000  vancomycin (VANCOCIN) 1,750 mg in sodium chloride 0.9 % 500 mL IVPB     1,750 mg 250 mL/hr over 120 Minutes Intravenous  Once 11/11/18 1944 11/12/18 0045   11/11/18 1945  ceFEPIme (MAXIPIME) 2 g in sodium chloride 0.9 % 100 mL IVPB     2 g 200 mL/hr over 30 Minutes Intravenous  Once 11/11/18 1938 11/11/18 2240   11/11/18 1945  metroNIDAZOLE (FLAGYL) IVPB 500 mg  Status:  Discontinued     500 mg 100 mL/hr over 60 Minutes Intravenous  Once 11/11/18 1938 11/12/18 0332   11/11/18 1945  vancomycin (VANCOCIN) IVPB 1000 mg/200 mL premix  Status:  Discontinued     1,000 mg 200 mL/hr over 60 Minutes Intravenous  Once 11/11/18 1938 11/11/18 1944          Subjective:  has moderate pain in left side of chest. Has mild cough with white mucus production. No SOB. AP has resolved. No fever or chills  Objective:  Vitals:   11/19/18 1644 11/19/18 1950 11/20/18 0447 11/20/18 0732  BP: 123/73 129/74 133/77 118/76  Pulse: 95 97 94 97  Resp: 18 20 20 18   Temp: 98.9 F (37.2 C) 98.1 F (36.7 C) 99 F (37.2 C) 98.7 F (37.1 C)  TempSrc: Oral Oral Oral Oral  SpO2: 96% 97% 95% 96%  Weight:      Height:        Intake/Output Summary (Last 24 hours) at 11/20/2018 0814 Last data filed at 11/20/2018 [id number] Gross per 24 hour  Intake 2120.69 ml  Output 950 ml  Net 1170.69 ml   Filed Weights   11/17/18 0452 11/18/18 0458 11/19/18 0317  Weight: 73.2 kg 70.9 kg 72.4 kg    Examination: Physical Exam:  General: Not in acute distress HEENT: PERRL, EOMI, no scleral icterus, No JVD or bruit Cardiac: S1/S2, RRR, No murmurs, gallops or rubs Pulm: Clear to auscultation bilaterally. No rales, wheezing, rhonchi or rubs. Has left lateral chest tube in place. Has some tenderness around chest tube. Abd: Soft, nondistended, nontender,  no rebound pain, no organomegaly, BS present Ext: No edema. 2+DP/PT pulse bilaterally Musculoskeletal: No joint deformities, erythema, or stiffness, ROM full Skin: No rashes.  Neuro: Alert and oriented X3, cranial nerves II-XII grossly intact. Psych: Patient is not psychotic, no suicidal or hemocidal ideation.    Data Reviewed: I have personally reviewed following labs and imaging studies  CBC: Recent Labs  Lab 11/15/18 0753 11/16/18 0531 11/17/18 0446 11/19/18 0918 11/20/18 0620  WBC 18.2* 18.0* 15.3* 17.7* 19.5*  NEUTROABS  --   --   --  14.9* 15.7*  HGB 11.0* 11.1* 10.6* 11.3* 9.7*  HCT 32.8* 33.2* 32.1* 34.5* 29.5*  MCV 92.9 92.2 94.1 94.5 92.8  PLT 357 414* 489* 755* 696*   Basic Metabolic Panel: Recent Labs  Lab 11/15/18 0753 11/16/18 0531 11/17/18 0446 11/18/18 0440 11/19/18 0918 11/20/18 0620  NA 137 139 140  --  140 139  K 3.4* 3.7 3.3*  --  3.2* 3.4*  CL 106 105 106  --  106 107  CO2 23 26 24   --  25 22  GLUCOSE 143* 110* 104*  --  122* 103*  BUN  13 8 7*  --  6* 5*  CREATININE 0.95 0.79 0.81 0.89 0.88 0.77  CALCIUM 8.4* 8.6* 8.2*  --  8.6* 8.3*   GFR: Estimated Creatinine Clearance: 94.5 mL/min (by C-G formula based on SCr of 0.77 mg/dL). Liver Function Tests: Recent Labs  Lab 11/14/18 0520 11/15/18 0753 11/16/18 0531 11/17/18 0446  AST 637* 200* 84* 55*  ALT 338* 210* 139* 93*  ALKPHOS 160* 160* 158* 125  BILITOT 1.3* 0.9 1.1 1.5*  PROT 5.3* 5.5* 5.6* 5.2*  ALBUMIN 2.0* 2.1* 2.0* 1.9*   No results for input(s): LIPASE, AMYLASE in the last 168 hours. No results for input(s): AMMONIA in the last 168 hours. Coagulation Profile: No results for input(s): INR, PROTIME in the last 168 hours. Cardiac Enzymes: No results for input(s): CKTOTAL, CKMB, CKMBINDEX, TROPONINI in the last 168 hours. BNP (last 3 results) No results for input(s): PROBNP in the last 8760 hours. HbA1C: No results for input(s): HGBA1C in the last 72 hours. CBG: No results for input(s): GLUCAP in the last 168 hours. Lipid Profile: No results for input(s): CHOL, HDL, LDLCALC, TRIG, CHOLHDL, LDLDIRECT in the last 72 hours. Thyroid Function Tests: No results for input(s): TSH, T4TOTAL, FREET4, T3FREE, THYROIDAB in the last 72 hours. Anemia Panel: No results for input(s): VITAMINB12, FOLATE, FERRITIN, TIBC, IRON, RETICCTPCT in the last 72 hours. Sepsis Labs: Recent Labs  Lab 11/13/18 0947 11/14/18 0755  LATICACIDVEN 2.3* 1.5    Recent Results (from the past 240 hour(s))  Blood Culture (routine x 2)     Status: None   Collection Time: 11/11/18  6:15 PM   Specimen: BLOOD  Result Value Ref Range Status   Specimen Description BLOOD BLOOD LEFT WRIST  Final   Special Requests   Final    BOTTLES DRAWN AEROBIC AND ANAEROBIC Blood Culture adequate volume   Culture   Final    NO GROWTH 5 DAYS Performed at [facility], [address] [place], [place] [address]    Report Status 11/16/2018 FINAL  Final  SARS CORONAVIRUS 2 (TAT 6-24 HRS)  Nasopharyngeal Nasopharyngeal Swab     Status: None   Collection Time: 11/11/18 10:41 PM   Specimen: Nasopharyngeal Swab  Result Value Ref Range Status   SARS Coronavirus 2 NEGATIVE NEGATIVE Final    Comment: (NOTE) SARS-CoV-2 target nucleic acids are  NOT DETECTED. The SARS-CoV-2 RNA is generally detectable in upper and lower respiratory specimens during the acute phase of infection. Negative results do not preclude SARS-CoV-2 infection, do not rule out co-infections with other pathogens, and should not be used as the sole basis for treatment or other patient management decisions. Negative results must be combined with clinical observations, patient history, and epidemiological information. The expected result is Negative. Fact Sheet for Patients: HairSlick.no Fact Sheet for Healthcare Providers: quierodirigir.com This test is not yet approved or cleared by the [place] FDA and  has been authorized for detection and/or diagnosis of SARS-CoV-2 by FDA under an Emergency Use Authorization (EUA). This EUA will remain  in effect (meaning this test can be used) for the duration of the COVID-19 declaration under Section 56 4(b)(1) of the Act, 21 U.S.C. section 360bbb-3(b)(1), unless the authorization is terminated or revoked sooner. Performed at [facility], [address] [place], [place] [address]   Blood Culture (routine x 2)     Status: None   Collection Time: 11/11/18 11:45 PM   Specimen: BLOOD  Result Value Ref Range Status   Specimen Description BLOOD BLOOD LEFT WRIST  Final   Special Requests   Final    BOTTLES DRAWN AEROBIC AND ANAEROBIC Blood Culture adequate volume   Culture   Final    NO GROWTH 5 DAYS Performed at [facility], [address] [place], [place] [address]    Report Status 11/16/2018 FINAL  Final  MRSA PCR Screening     Status: None   Collection Time: 11/12/18  7:50 AM    Specimen: Nasal Mucosa; Nasopharyngeal  Result Value Ref Range Status   MRSA by PCR NEGATIVE NEGATIVE Final    Comment:        The GeneXpert MRSA Assay (FDA approved for NASAL specimens only), is one component of a comprehensive MRSA colonization surveillance program. It is not intended to diagnose MRSA infection nor to guide or monitor treatment for MRSA infections. Performed at [facility], [address] [place], [place] [address]   Aerobic/Anaerobic Culture (surgical/deep wound)     Status: None   Collection Time: 11/12/18  5:08 PM   Specimen: Pleural Fluid  Result Value Ref Range Status   Specimen Description   Final    PLEURAL LEFT PLEURAL FLUID Performed at [facility], [address] [place], [place] [address]    Special Requests   Final    NONE Performed at [facility], [address] [place], [place] [address]    Gram Stain   Final    WBC PRESENT,BOTH PMN AND MONONUCLEAR NO ORGANISMS SEEN CYTOSPIN SMEAR    Culture   Final    RARE VIRIDANS STREPTOCOCCUS CRITICAL RESULT CALLED TO, READ BACK BY AND VERIFIED WITH: RN DARLENE LISTOPAD [id number] FCP NO ANAEROBES ISOLATED Performed at [facility], [address], [place], [place] [address]    Report Status 11/19/2018 FINAL  Final   Organism ID, Bacteria VIRIDANS STREPTOCOCCUS  Final      Susceptibility   Viridans streptococcus - MIC*    PENICILLIN 0.25 INTERMEDIATE Intermediate     CEFTRIAXONE 1 SENSITIVE Sensitive     ERYTHROMYCIN >=8 RESISTANT Resistant     LEVOFLOXACIN 1 SENSITIVE Sensitive     VANCOMYCIN 0.5 SENSITIVE Sensitive     * RARE VIRIDANS STREPTOCOCCUS  CULTURE, BLOOD (ROUTINE X 2) w Reflex to ID Panel     Status: None (Preliminary result)  Collection Time: 11/16/18  5:12 PM   Specimen: BLOOD  Result Value Ref Range Status   Specimen Description BLOOD BLOOD RIGHT WRIST  Final   Special Requests   Final    BOTTLES DRAWN AEROBIC AND ANAEROBIC Blood Culture  adequate volume   Culture   Final    NO GROWTH 4 DAYS Performed at [facility], [address], [place], [place] [address]    Report Status PENDING  Incomplete  CULTURE, BLOOD (ROUTINE X 2) w Reflex to ID Panel     Status: None (Preliminary result)   Collection Time: 11/16/18  5:27 PM   Specimen: BLOOD  Result Value Ref Range Status   Specimen Description BLOOD BLOOD LEFT WRIST  Final   Special Requests   Final    BOTTLES DRAWN AEROBIC AND ANAEROBIC Blood Culture results may not be optimal due to an excessive volume of blood received in culture bottles   Culture   Final    NO GROWTH 4 DAYS Performed at [facility], [address], [place], [place] [address]    Report Status PENDING  Incomplete     RN Pressure Injury Documentation:     Estimated body mass index is 23.57 kg/m as calculated from the following:   Height as of this encounter: 5\' 9"  (1.753 m).   Weight as of this encounter: 72.4 kg.  Malnutrition Type:      Malnutrition Characteristics:      Nutrition Interventions:           Radiology Studies: No results found.      Scheduled Meds: . alteplase (tPA) 10mg  in NS 62mL for Dr.Oaks (intrapleural administration/[facility])   Intrapleural Once  . aspirin EC  81 mg Oral Daily  . enoxaparin (LOVENOX) injection  40 mg Subcutaneous Q24H  . feeding supplement (ENSURE ENLIVE)  237 mL Oral BID BM  . folic acid  1 mg Oral Daily  . multivitamin with minerals  1 tablet Oral Daily  . nicotine  21 mg Transdermal Daily  . thiamine  100 mg Oral Daily   Or  . thiamine  100 mg Intravenous Daily   Continuous Infusions: . sodium chloride 75 mL/hr at 11/18/18 1859  . sodium chloride 75 mL/hr at 11/20/18 0500  . ceFEPime (MAXIPIME) IV 2 g (11/20/18 0502)     LOS: 9 days    Time spent: 30 min    Ivor Costa, DO [facility] PAGER is on [place]  If 7PM-7AM, please contact night-coverage [email or web address] Password [email or web address] 11/20/2018, 8:14  AM

## 2018-11-20 NOTE — Progress Notes (Addendum)
[facility] SURGICAL PROGRESS NOTE (cpt [phone])  Hospital Day(s): 9.   Interval History: Patient seen and examined, no acute events or new complaints overnight. Patient reports his breathing has significantly continued to improve. No fever, chills, SOB, or CP. Chest tube output has been about 50 ccs or less a day x2 days. This appears serous. There is a slight bump in his leukocytosis over the last 2 days, unclear source. No other complaints from thoracic standpoint.   Review of Systems:  Constitutional: denies fever, chills  HEENT: denies cough or congestion  Respiratory: denies any shortness of breath  Cardiovascular: denies chest pain or palpitations  Gastrointestinal: denies abdominal pain, N/V, or diarrhea/and bowel function as per interval history  Vital signs in last 24 hours: [min-max] current  Temp:  [98.1 F (36.7 C)-99 F (37.2 C)] 98.7 F (37.1 C) (11/04 0732) Pulse Rate:  [94-97] 97 (11/04 0732) Resp:  [18-20] 18 (11/04 0732) BP: (118-133)/(73-77) 118/76 (11/04 0732) SpO2:  [95 %-97 %] 96 % (11/04 0732)     Height: 5\' 9"  (175.3 cm) Weight: 72.4 kg BMI (Calculated): 23.56   Intake/Output last 2 shifts:  11/03 0701 - 11/04 0700 In: 2120.7 [P.O.:360; I.V.:1560.7; IV Piggyback:200] Out: 950 [Urine:950]   Physical Exam:  Constitutional: alert, cooperative and no distress  HENT: normocephalic without obvious abnormality  Respiratory: breathing non-labored at rest; lungs sound clear bilaterally  Cardiovascular: regular rate and sinus rhythm  Chest: Chest tube in the left lateral chest wall, CDI Musculoskeletal: no edema or wounds, motor and sensation grossly intact, NT    Labs:  CBC Latest Ref Rng & Units 11/20/2018 11/19/2018 11/17/2018  WBC 4.0 - 10.5 K/uL 19.5(H) 17.7(H) 15.3(H)  Hemoglobin 13.0 - 17.0 g/dL 13/01/2018) 11.3(L) 10.6(L)  Hematocrit 39.0 - 52.0 % 29.5(L) 34.5(L) 32.1(L)  Platelets 150 - 400 K/uL 696(H) 755(H) 489(H)   CMP Latest Ref Rng &  Units 11/20/2018 11/19/2018 11/18/2018  Glucose 70 - 99 mg/dL 13/02/2018) 591(M) -  BUN 8 - 23 mg/dL 5(L) 6(L) -  Creatinine 0.61 - 1.24 mg/dL 384(Y 6.59 9.35  Sodium 135 - 145 mmol/L 139 140 -  Potassium 3.5 - 5.1 mmol/L 3.4(L) 3.2(L) -  Chloride 98 - 111 mmol/L 107 106 -  CO2 22 - 32 mmol/L 22 25 -  Calcium 8.9 - 10.3 mg/dL 8.3(L) 8.6(L) -  Total Protein 6.5 - 8.1 g/dL - - -  Total Bilirubin 0.3 - 1.2 mg/dL - - -  Alkaline Phos 38 - 126 U/L - - -  AST 15 - 41 U/L - - -  ALT 0 - 44 U/L - - -     Imaging studies:   CXR (11/20/2018) personally reviewed which shows near resolution of left pleural effusion, and radiologist report reviewed IMPRESSION: Marked reduction in size of the left empyema with some residual loculated fluid at the left lung base. Left chest tube remains in Place.   Assessment/Plan: (ICD-10's: J90.00) 63 y.o. male overall doing well, mild leukocytosis of unclear etiology, now with resolution in his left sided pleural effusion.    - Discussed case with Dr 64. I will place chest tube to waterseal this afternoon. Continue to monitor output. If his output remains low, serous, and he remains without fevers/increasing leukocytosis then plan to remove chest tube tomorrow. No indication currently for any surgical intervention regarding his effusions as they are essentially resolved. Again, I discussed case with [facility] on Monday, nothing more to add from their standpoint, however if he acutely worsens (  ex: fevers, worsening effusions) they would be willing to see him in consult at [facility].    All of the above findings and recommendations were discussed with the patient, and the medical team, and all of patient's questions were answered to his expressed satisfaction.  -- Edison Simon, PA-C [facility] 11/20/2018, 9:58 AM [phone] M-F: 7am - 4pm

## 2018-11-21 DIAGNOSIS — D72829 Elevated white blood cell count, unspecified: Secondary | ICD-10-CM

## 2018-11-21 DIAGNOSIS — M109 Gout, unspecified: Secondary | ICD-10-CM

## 2018-11-21 DIAGNOSIS — Z978 Presence of other specified devices: Secondary | ICD-10-CM

## 2018-11-21 DIAGNOSIS — E46 Unspecified protein-calorie malnutrition: Secondary | ICD-10-CM

## 2018-11-21 DIAGNOSIS — K760 Fatty (change of) liver, not elsewhere classified: Secondary | ICD-10-CM

## 2018-11-21 DIAGNOSIS — R7401 Elevation of levels of liver transaminase levels: Secondary | ICD-10-CM

## 2018-11-21 DIAGNOSIS — Z7289 Other problems related to lifestyle: Secondary | ICD-10-CM

## 2018-11-21 DIAGNOSIS — M461 Sacroiliitis, not elsewhere classified: Secondary | ICD-10-CM

## 2018-11-21 DIAGNOSIS — N179 Acute kidney failure, unspecified: Secondary | ICD-10-CM

## 2018-11-21 DIAGNOSIS — R945 Abnormal results of liver function studies: Secondary | ICD-10-CM

## 2018-11-21 DIAGNOSIS — B954 Other streptococcus as the cause of diseases classified elsewhere: Secondary | ICD-10-CM

## 2018-11-21 DIAGNOSIS — I4891 Unspecified atrial fibrillation: Secondary | ICD-10-CM

## 2018-11-21 DIAGNOSIS — F17200 Nicotine dependence, unspecified, uncomplicated: Secondary | ICD-10-CM

## 2018-11-21 LAB — CULTURE, BLOOD (ROUTINE X 2)
Culture: NO GROWTH
Culture: NO GROWTH
Special Requests: ADEQUATE

## 2018-11-21 LAB — CBC WITH DIFFERENTIAL/PLATELET
Abs Immature Granulocytes: 0.22 10*3/uL — ABNORMAL HIGH (ref 0.00–0.07)
Abs Immature Granulocytes: 0.21 10*3/uL — ABNORMAL HIGH (ref 0.00–0.07)
Basophils Absolute: 0.1 10*3/uL (ref 0.0–0.1)
Basophils Absolute: 0.1 10*3/uL (ref 0.0–0.1)
Basophils Relative: 1 %
Basophils Relative: 0 %
Eosinophils Absolute: 0.1 10*3/uL (ref 0.0–0.5)
Eosinophils Absolute: 0.1 10*3/uL (ref 0.0–0.5)
Eosinophils Relative: 0 %
Eosinophils Relative: 1 %
HCT: 29.2 % — ABNORMAL LOW (ref 39.0–52.0)
HCT: 30.8 % — ABNORMAL LOW (ref 39.0–52.0)
Hemoglobin: 9.7 g/dL — ABNORMAL LOW (ref 13.0–17.0)
Hemoglobin: 10 g/dL — ABNORMAL LOW (ref 13.0–17.0)
Immature Granulocytes: 1 %
Immature Granulocytes: 1 %
Lymphocytes Relative: 8 %
Lymphocytes Relative: 8 %
Lymphs Abs: 1.6 10*3/uL (ref 0.7–4.0)
Lymphs Abs: 1.7 10*3/uL (ref 0.7–4.0)
MCH: 30.2 pg (ref 26.0–34.0)
MCH: 30 pg (ref 26.0–34.0)
MCHC: 33.2 g/dL (ref 30.0–36.0)
MCHC: 32.5 g/dL (ref 30.0–36.0)
MCV: 91 fL (ref 80.0–100.0)
MCV: 92.5 fL (ref 80.0–100.0)
Monocytes Absolute: 1.4 10*3/uL — ABNORMAL HIGH (ref 0.1–1.0)
Monocytes Absolute: 1.4 10*3/uL — ABNORMAL HIGH (ref 0.1–1.0)
Monocytes Relative: 7 %
Monocytes Relative: 7 %
Neutro Abs: 18 10*3/uL — ABNORMAL HIGH (ref 1.7–7.7)
Neutro Abs: 17.4 10*3/uL — ABNORMAL HIGH (ref 1.7–7.7)
Neutrophils Relative %: 83 %
Neutrophils Relative %: 83 %
Platelets: 721 10*3/uL — ABNORMAL HIGH (ref 150–400)
Platelets: 711 10*3/uL — ABNORMAL HIGH (ref 150–400)
RBC: 3.21 MIL/uL — ABNORMAL LOW (ref 4.22–5.81)
RBC: 3.33 MIL/uL — ABNORMAL LOW (ref 4.22–5.81)
RDW: 14.6 % (ref 11.5–15.5)
RDW: 14.6 % (ref 11.5–15.5)
WBC: 21.5 10*3/uL — ABNORMAL HIGH (ref 4.0–10.5)
WBC: 20.9 10*3/uL — ABNORMAL HIGH (ref 4.0–10.5)
nRBC: 0 % (ref 0.0–0.2)
nRBC: 0 % (ref 0.0–0.2)

## 2018-11-21 LAB — COMPREHENSIVE METABOLIC PANEL
ALT: 77 U/L — ABNORMAL HIGH (ref 0–44)
AST: 63 U/L — ABNORMAL HIGH (ref 15–41)
Albumin: 2 g/dL — ABNORMAL LOW (ref 3.5–5.0)
Alkaline Phosphatase: 116 U/L (ref 38–126)
Anion gap: 8 (ref 5–15)
BUN: 5 mg/dL — ABNORMAL LOW (ref 8–23)
CO2: 22 mmol/L (ref 22–32)
Calcium: 8.6 mg/dL — ABNORMAL LOW (ref 8.9–10.3)
Chloride: 108 mmol/L (ref 98–111)
Creatinine, Ser: 0.8 mg/dL (ref 0.61–1.24)
GFR calc Af Amer: >60 mL/min (ref >60)
GFR calc non Af Amer: >60 mL/min (ref >60)
Glucose, Bld: 99 mg/dL (ref 70–99)
Potassium: 3.6 mmol/L (ref 3.5–5.1)
Sodium: 138 mmol/L (ref 135–145)
Total Bilirubin: 0.6 mg/dL (ref 0.3–1.2)
Total Protein: 5.7 g/dL — ABNORMAL LOW (ref 6.5–8.1)

## 2018-11-21 MED ORDER — SODIUM CHLORIDE 0.9 % IV SOLN
3.0000 g | Freq: Four times a day (QID) | INTRAVENOUS | Status: DC
  Administered 2018-11-21 – 2018-11-25 (×15): 3 g via INTRAVENOUS
  Filled 2018-11-21: qty 3
  Filled 2018-11-21: qty 8
  Filled 2018-11-21 (×7): qty 3
  Filled 2018-11-21 (×2): qty 8
  Filled 2018-11-21 (×4): qty 3
  Filled 2018-11-21 (×2): qty 8
  Filled 2018-11-21 (×2): qty 3

## 2018-11-21 NOTE — Progress Notes (Signed)
Initial Nutrition Assessment  DOCUMENTATION CODES:   Severe malnutrition in context of social or environmental circumstances  INTERVENTION:   Ensure Enlive po BID, each supplement provides 350 kcal and 20 grams of protein  Magic cup TID with meals, each supplement provides 290 kcal and 9 grams of protein  MVI, thiamine and folic acid in setting of etoh abuse   Pt likely at moderate refeed risk r/t etoh abuse; recommend monitor K, Mg and P labs until stable  NUTRITION DIAGNOSIS:   Severe Malnutrition related to social / environmental circumstances(etoh abuse) as evidenced by moderate to severe muscle depletions, moderate to severe fat depletions.  GOAL:   Patient will meet greater than or equal to 90% of their needs  MONITOR:   PO intake, Supplement acceptance, Labs, Weight trends, Skin, I & O's  REASON FOR ASSESSMENT:   LOS    ASSESSMENT:   63 year old white male with history of tobacco and alcohol abuse presented to the hospital complaining of vague epigastric right upper quadrant and chest pain. Pt found to have empyema/necrotizing PNA s/p IR chest tube 10/27, hepatitis s/p MRCP 10/27 and gallstones.   Met with pt in room today. Pt reports poor appetite and oral intake for several days pta and several days after admit but reports that over the past 3-4 days his appetite has improved. Pt currently eating 100% of meals and drinking some Ensure in hospital. Recommend continue supplements and vitamins after discharge. Pt reports that is weight is stable; there is no documented history in chart to confirm.    Medications reviewed and include: aspirin, lovenox, folic acid, MVI, nicotine, thiamine, ceftriaxone    Labs reviewed: K 3.6 wnl, AST 63(H), ALT 77(H) Wbc- 21.5(H), Hgb 9.7(L), Hct 29.2(L)  NUTRITION - FOCUSED PHYSICAL EXAM:    Most Recent Value  Orbital Region  Mild depletion  Upper Arm Region  Severe depletion  Thoracic and Lumbar Region  Severe depletion  Buccal  Region  Moderate depletion  Temple Region  Severe depletion  Clavicle Bone Region  Severe depletion  Clavicle and Acromion Bone Region  Severe depletion  Scapular Bone Region  Moderate depletion  Dorsal Hand  Severe depletion  Patellar Region  Severe depletion  Anterior Thigh Region  Severe depletion  Posterior Calf Region  Severe depletion  Edema (RD Assessment)  None  Hair  Reviewed  Eyes  Reviewed  Mouth  Reviewed  Skin  Reviewed  Nails  Reviewed     Diet Order:   Diet Order            Diet Heart Room service appropriate? Yes; Fluid consistency: Thin  Diet effective now             EDUCATION NEEDS:   Education needs have been addressed  Skin:  Skin Assessment: Reviewed RN Assessment  Last BM:  11/5- type 4  Height:   Ht Readings from Last 1 Encounters:  11/12/18 5' 9"  (1.753 m)    Weight:   Wt Readings from Last 1 Encounters:  11/21/18 72.3 kg    Ideal Body Weight:  72.7 kg  BMI:  Body mass index is 23.55 kg/m.  Estimated Nutritional Needs:   Kcal:  2000-2300kcal/day  Protein:  100-115g/day  Fluid:  >2L/day  [name] MS, RD, LDN Pager #- [phone] Office#- [phone] After Hours Pager: [phone]

## 2018-11-21 NOTE — Progress Notes (Signed)
PROGRESS NOTE    Thomas Coffey  TKK:[id number] DOB: [birth year] DOA: 11/11/2018 PCP: Patient, [name]    Brief Narrative:  Is a 63 year old white male with history of tobacco and alcohol abuse presented to the hospital complaining of vague epigastric right upper quadrant and chest pain. In the ER patient underwent blood work which showed acute kidney injury, abnormal LFTs, and ultrasound findings suggestive of suspected cholelithiasis with possible cholecystitis. Is also known to be in A. fib RVR. As part of his work-up in the ER he was found to have an elevated D-dimer and underwent a CTA of chest which was suggestive of a necrotizing left-sided pneumonia with empyema. He was sent to the hospital service admitted for further eval and treatment.  Interim History: -Pleural fluid culture grow RARE VIRIDANS STREPTOCOCCUS. narrowed down abx from cefepime to rocephine 11/4. Bx negative so far. -WBC trending up 17.7 -->19.5 -->21.5 today --> will consult ID  Assessment & Plan:   Principal Problem:   Empyema lung (HCC) Active Problems:   Acute hepatitis   Atrial fibrillation with RVR (HCC)   Alcohol abuse   Tobacco dependence syndrome   Atrial fibrillation with rapid ventricular response (HCC)   Empyema (HCC)   Left sided empyema- meeting sepsis criteria on admission with tachycardia, leukocytosis, and lactic acidosis, all of which are improving. Body fluid cultures positive for RARE VIRIDANS STREPTOCOCCUS. Narrowed down from cefepime to rocephine 11/4. WBC trending up 17.7 -->19.5 -->21.5 today --> will consult ID -s/p CT-guided left chest tube placement 10/27 -Cardiothoracic surgery following-patient will receivedhis 3rd round ofintrapleural thrombolytics10/31 -  PA from Surgery saw pt on 11/4 -repeat CT chest November 1 showed significant improvement-discussed the case with cardiothoracic recommendations for continued chest tube management and IV antibiotics. -If patient  declines recommend transfer to [facility] Case has been discussed with [facility]. -Blood cultures with no growth -will consult ID -surgeon agreed to hold off pulling out chest tube until we get ID input   Acute hepatitis:likelydue to ischemic hepatitis in the setting of sepsis vs alcoholic hepatitis. Improving. GI was consulted. -RUQ ultrasound was suggestive of cholelithiasis and showed some possible gallbladder wall thickening, but doubt cholecystitis without any right upper quadrant pain-patient still without pain -MRCP performed 10/27 with no evidence of biliary tract obstruction -Acute hepatitis panel was negative -Avoid hepatotoxins -Monitor  A. fib with RVR- patient had one episode on admission, likely precipitated byhispneumonia. He has been in NSR since then. CHADSVASc is a 0. -Noplans to place patient on long term anticoagulation -ECHO with EF 60-65%, mild LVH -Cardiac monitoring -on ASA  Alcohol abuse- drinks 4 beers and 1/2 pint of liquor per day. -StoppedCIWA 10/31, as patient has been asymptomatic for 5 days -Continue withMVI, thiamine, folate  Tobacco use -Nicotine patch daily -Provided tobacco cessation counseling   DVT prophylaxis: Lovenos  Code Status: fall Family Communication: none.  Disposition Plan: not ready for discharge. Still has chest tube with draining.   Consultants:   CT surgeon  surgery  Procedures:  S/p of MRCP  S/p of  CT-guided left chest tube placement 10/27  Antimicrobials: Anti-infectives (From admission, onward)   Start     Dose/Rate Route Frequency Ordered Stop   11/20/18 1400  cefTRIAXone (ROCEPHIN) 2 g in sodium chloride 0.9 % 100 mL IVPB     2 g 200 mL/hr over 30 Minutes Intravenous Every 24 hours 11/20/18 1222     11/17/18 1745  levofloxacin (LEVAQUIN) tablet  750 mg     750 mg Oral  Once 11/17/18 1733 11/17/18 1843   11/17/18 0600  vancomycin (VANCOCIN) IVPB 1000 mg/200 mL premix   Status:  Discontinued     1,000 mg 200 mL/hr over 60 Minutes Intravenous Every 12 hours 11/16/18 1848 11/17/18 1319   11/16/18 1800  vancomycin (VANCOCIN) 1,500 mg in sodium chloride 0.9 % 500 mL IVPB     1,500 mg 250 mL/hr over 120 Minutes Intravenous  Once 11/16/18 1717 11/16/18 2023   11/15/18 2200  ceFEPIme (MAXIPIME) 2 g in sodium chloride 0.9 % 100 mL IVPB  Status:  Discontinued     2 g 200 mL/hr over 30 Minutes Intravenous Every 8 hours 11/15/18 1246 11/20/18 1222   11/14/18 1200  ceFEPIme (MAXIPIME) 2 g in sodium chloride 0.9 % 100 mL IVPB  Status:  Discontinued     2 g 200 mL/hr over 30 Minutes Intravenous Every 12 hours 11/14/18 0801 11/15/18 1246   11/13/18 1000  vancomycin (VANCOCIN) 1,250 mg in sodium chloride 0.9 % 250 mL IVPB  Status:  Discontinued     1,250 mg 166.7 mL/hr over 90 Minutes Intravenous Every 36 hours 11/12/18 0340 11/12/18 0759   11/12/18 0759  vancomycin variable dose per unstable renal function (pharmacist dosing)  Status:  Discontinued      Does not apply See admin instructions 11/12/18 0759 11/12/18 1501   11/12/18 0600  piperacillin-tazobactam (ZOSYN) IVPB 3.375 g  Status:  Discontinued     3.375 g 12.5 mL/hr over 240 Minutes Intravenous Every 8 hours 11/12/18 0332 11/14/18 0740   11/11/18 2230  piperacillin-tazobactam (ZOSYN) IVPB 3.375 g     3.375 g 100 mL/hr over 30 Minutes Intravenous STAT 11/11/18 2205 11/11/18 2257   11/11/18 2000  vancomycin (VANCOCIN) 1,750 mg in sodium chloride 0.9 % 500 mL IVPB     1,750 mg 250 mL/hr over 120 Minutes Intravenous  Once 11/11/18 1944 11/12/18 0045   11/11/18 1945  ceFEPIme (MAXIPIME) 2 g in sodium chloride 0.9 % 100 mL IVPB     2 g 200 mL/hr over 30 Minutes Intravenous  Once 11/11/18 1938 11/11/18 2240   11/11/18 1945  metroNIDAZOLE (FLAGYL) IVPB 500 mg  Status:  Discontinued     500 mg 100 mL/hr over 60 Minutes Intravenous  Once 11/11/18 1938 11/12/18 0332   11/11/18 1945  vancomycin (VANCOCIN) IVPB 1000  mg/200 mL premix  Status:  Discontinued     1,000 mg 200 mL/hr over 60 Minutes Intravenous  Once 11/11/18 1938 11/11/18 1944         Subjective:  has moderate pain in left side of chest. Has mild cough with white mucus production. No SOB. AP has resolved. No fever or chills  Objective: Vitals:   11/20/18 1512 11/20/18 1820 11/20/18 2048 11/21/18 0457  BP: (!) 141/73  133/84 (!) 141/79  Pulse: 96  93 (!) 101  Resp: 18  20 20   Temp: 100 F (37.8 C) 98.7 F (37.1 C) 98.7 F (37.1 C) 98 F (36.7 C)  TempSrc: Oral Oral Oral Oral  SpO2: 97%  96% 100%  Weight:    72.3 kg  Height:        Intake/Output Summary (Last 24 hours) at 11/21/2018 0719 Last data filed at 11/21/2018 0500 Gross per 24 hour  Intake 1048.34 ml  Output 1725 ml  Net -676.66 ml   Filed Weights   11/18/18 0458 11/19/18 0317 11/21/18 0457  Weight: 70.9 kg 72.4 kg 72.3 kg  Examination: Physical Exam:  General: Not in acute distress HEENT: PERRL, EOMI, no scleral icterus, No JVD or bruit Cardiac: S1/S2, RRR, No murmurs, gallops or rubs Pulm: Clear to auscultation bilaterally. No rales, wheezing, rhonchi or rubs. Has left lateral chest tube in place. Has some tenderness around chest tube. Abd: Soft, nondistended, nontender, no rebound pain, no organomegaly, BS present Ext: No edema. 2+DP/PT pulse bilaterally Musculoskeletal: No joint deformities, erythema, or stiffness, ROM full Skin: No rashes.  Neuro: Alert and oriented X3, cranial nerves II-XII grossly intact. Psych: Patient is not psychotic, no suicidal or hemocidal ideation.    Data Reviewed: I have personally reviewed following labs and imaging studies  CBC: Recent Labs  Lab 11/16/18 0531 11/17/18 0446 11/19/18 0918 11/20/18 0620 11/21/18 0442  WBC 18.0* 15.3* 17.7* 19.5* 21.5*  NEUTROABS  --   --  14.9* 15.7* 18.0*  HGB 11.1* 10.6* 11.3* 9.7* 9.7*  HCT 33.2* 32.1* 34.5* 29.5* 29.2*  MCV 92.2 94.1 94.5 92.8 91.0  PLT 414* 489* 755*  696* 160*   Basic Metabolic Panel: Recent Labs  Lab 11/16/18 0531 11/17/18 0446 11/18/18 0440 11/19/18 0918 11/20/18 0620 11/21/18 0442  NA 139 140  --  140 139 138  K 3.7 3.3*  --  3.2* 3.4* 3.6  CL 105 106  --  106 107 108  CO2 26 24  --  25 22 22   GLUCOSE 110* 104*  --  122* 103* 99  BUN 8 7*  --  6* 5* 5*  CREATININE 0.79 0.81 0.89 0.88 0.77 0.80  CALCIUM 8.6* 8.2*  --  8.6* 8.3* 8.6*   GFR: Estimated Creatinine Clearance: 94.5 mL/min (by C-G formula based on SCr of 0.8 mg/dL). Liver Function Tests: Recent Labs  Lab 11/15/18 0753 11/16/18 0531 11/17/18 0446 11/21/18 0442  AST 200* 84* 55* 63*  ALT 210* 139* 93* 77*  ALKPHOS 160* 158* 125 116  BILITOT 0.9 1.1 1.5* 0.6  PROT 5.5* 5.6* 5.2* 5.7*  ALBUMIN 2.1* 2.0* 1.9* 2.0*   No results for input(s): LIPASE, AMYLASE in the last 168 hours. No results for input(s): AMMONIA in the last 168 hours. Coagulation Profile: No results for input(s): INR, PROTIME in the last 168 hours. Cardiac Enzymes: No results for input(s): CKTOTAL, CKMB, CKMBINDEX, TROPONINI in the last 168 hours. BNP (last 3 results) No results for input(s): PROBNP in the last 8760 hours. HbA1C: No results for input(s): HGBA1C in the last 72 hours. CBG: No results for input(s): GLUCAP in the last 168 hours. Lipid Profile: No results for input(s): CHOL, HDL, LDLCALC, TRIG, CHOLHDL, LDLDIRECT in the last 72 hours. Thyroid Function Tests: No results for input(s): TSH, T4TOTAL, FREET4, T3FREE, THYROIDAB in the last 72 hours. Anemia Panel: No results for input(s): VITAMINB12, FOLATE, FERRITIN, TIBC, IRON, RETICCTPCT in the last 72 hours. Sepsis Labs: No results for input(s): PROCALCITON, LATICACIDVEN in the last 168 hours.  Recent Results (from the past 240 hour(s))  Blood Culture (routine x 2)     Status: None   Collection Time: 11/11/18  6:15 PM   Specimen: BLOOD  Result Value Ref Range Status   Specimen Description BLOOD BLOOD LEFT WRIST  Final    Special Requests   Final    BOTTLES DRAWN AEROBIC AND ANAEROBIC Blood Culture adequate volume   Culture   Final    NO GROWTH 5 DAYS Performed at [facility], [address] [place], [place] [address]    Report Status 11/16/2018 FINAL  Final  SARS  CORONAVIRUS 2 (TAT 6-24 HRS) Nasopharyngeal Nasopharyngeal Swab     Status: None   Collection Time: 11/11/18 10:41 PM   Specimen: Nasopharyngeal Swab  Result Value Ref Range Status   SARS Coronavirus 2 NEGATIVE NEGATIVE Final    Comment: (NOTE) SARS-CoV-2 target nucleic acids are NOT DETECTED. The SARS-CoV-2 RNA is generally detectable in upper and lower respiratory specimens during the acute phase of infection. Negative results do not preclude SARS-CoV-2 infection, do not rule out co-infections with other pathogens, and should not be used as the sole basis for treatment or other patient management decisions. Negative results must be combined with clinical observations, patient history, and epidemiological information. The expected result is Negative. Fact Sheet for Patients: HairSlick.no Fact Sheet for Healthcare Providers: quierodirigir.com This test is not yet approved or cleared by the [place] FDA and  has been authorized for detection and/or diagnosis of SARS-CoV-2 by FDA under an Emergency Use Authorization (EUA). This EUA will remain  in effect (meaning this test can be used) for the duration of the COVID-19 declaration under Section 56 4(b)(1) of the Act, 21 U.S.C. section 360bbb-3(b)(1), unless the authorization is terminated or revoked sooner. Performed at [facility], [address], [place], [place] [address]   Blood Culture (routine x 2)     Status: None   Collection Time: 11/11/18 11:45 PM   Specimen: BLOOD  Result Value Ref Range Status   Specimen Description BLOOD BLOOD LEFT WRIST  Final   Special Requests   Final    BOTTLES DRAWN  AEROBIC AND ANAEROBIC Blood Culture adequate volume   Culture   Final    NO GROWTH 5 DAYS Performed at [facility], [address] [place], [place] [address]    Report Status 11/16/2018 FINAL  Final  MRSA PCR Screening     Status: None   Collection Time: 11/12/18  7:50 AM   Specimen: Nasal Mucosa; Nasopharyngeal  Result Value Ref Range Status   MRSA by PCR NEGATIVE NEGATIVE Final    Comment:        The GeneXpert MRSA Assay (FDA approved for NASAL specimens only), is one component of a comprehensive MRSA colonization surveillance program. It is not intended to diagnose MRSA infection nor to guide or monitor treatment for MRSA infections. Performed at [facility], [address] [place], [place] [address]   Aerobic/Anaerobic Culture (surgical/deep wound)     Status: None   Collection Time: 11/12/18  5:08 PM   Specimen: Pleural Fluid  Result Value Ref Range Status   Specimen Description   Final    PLEURAL LEFT PLEURAL FLUID Performed at [facility], [address], [place], [place] [address]    Special Requests   Final    NONE Performed at [facility], [address] [place], [place] [address]    Gram Stain   Final    WBC PRESENT,BOTH PMN AND MONONUCLEAR NO ORGANISMS SEEN CYTOSPIN SMEAR    Culture   Final    RARE VIRIDANS STREPTOCOCCUS CRITICAL RESULT CALLED TO, READ BACK BY AND VERIFIED WITH: RN DARLENE LISTOPAD [id number] FCP NO ANAEROBES ISOLATED Performed at [facility], [address] [place], [place] [address]    Report Status 11/19/2018 FINAL  Final   Organism ID, Bacteria VIRIDANS STREPTOCOCCUS  Final      Susceptibility   Viridans streptococcus - MIC*    PENICILLIN 0.25 INTERMEDIATE Intermediate     CEFTRIAXONE 1 SENSITIVE Sensitive  ERYTHROMYCIN >=8 RESISTANT Resistant     LEVOFLOXACIN 1 SENSITIVE Sensitive     VANCOMYCIN 0.5 SENSITIVE Sensitive     * RARE VIRIDANS STREPTOCOCCUS  CULTURE, BLOOD  (ROUTINE X 2) w Reflex to ID Panel     Status: None   Collection Time: 11/16/18  5:12 PM   Specimen: BLOOD  Result Value Ref Range Status   Specimen Description BLOOD BLOOD RIGHT WRIST  Final   Special Requests   Final    BOTTLES DRAWN AEROBIC AND ANAEROBIC Blood Culture adequate volume   Culture   Final    NO GROWTH 5 DAYS Performed at [facility], [address] [place], [place] [address]    Report Status 11/21/2018 FINAL  Final  CULTURE, BLOOD (ROUTINE X 2) w Reflex to ID Panel     Status: None   Collection Time: 11/16/18  5:27 PM   Specimen: BLOOD  Result Value Ref Range Status   Specimen Description BLOOD BLOOD LEFT WRIST  Final   Special Requests   Final    BOTTLES DRAWN AEROBIC AND ANAEROBIC Blood Culture results may not be optimal due to an excessive volume of blood received in culture bottles   Culture   Final    NO GROWTH 5 DAYS Performed at [facility], [address] [place], [place] [address]    Report Status 11/21/2018 FINAL  Final     RN Pressure Injury Documentation:     Estimated body mass index is 23.55 kg/m as calculated from the following:   Height as of this encounter: 5\' 9"  (1.753 m).   Weight as of this encounter: 72.3 kg.  Malnutrition Type:      Malnutrition Characteristics:      Nutrition Interventions:           Radiology Studies: Dg Chest 2 View  Result Date: 11/20/2018 CLINICAL DATA:  Left empyema. Left chest tube. EXAM: CHEST - 2 VIEW COMPARISON:  11/16/2018 and 11/13/2018 FINDINGS: Left chest tube remains in place. There has been a significant reduction in size of the left empyema since prior studies. There is some residual loculated fluid at the left lung base. The heart size and vascularity are normal. No infiltrates. No acute bone abnormality. IMPRESSION: Marked reduction in size of the left empyema with some residual loculated fluid at the left lung base. Left chest tube remains in place. Electronically  Signed   By: Francene BoyersJames  Maxwell M.D.   On: 11/20/2018 09:46        Scheduled Meds: . alteplase (tPA) 10mg  in NS 40mL for Dr.Oaks (intrapleural administration/[facility])   Intrapleural Once  . aspirin EC  81 mg Oral Daily  . enoxaparin (LOVENOX) injection  40 mg Subcutaneous Q24H  . feeding supplement (ENSURE ENLIVE)  237 mL Oral BID BM  . folic acid  1 mg Oral Daily  . multivitamin with minerals  1 tablet Oral Daily  . nicotine  21 mg Transdermal Daily  . thiamine  100 mg Oral Daily   Or  . thiamine  100 mg Intravenous Daily   Continuous Infusions: . sodium chloride 75 mL/hr at 11/18/18 1859  . sodium chloride 75 mL/hr at 11/21/18 0336  . cefTRIAXone (ROCEPHIN)  IV 2 g (11/20/18 1421)     LOS: 10 days    Time spent: 30 min    Lorretta HarpXilin Shrihan Putt, DO [facility] PAGER is on AMION  If 7PM-7AM, please contact night-coverage [email or web address] Password [email or web address] 11/21/2018, 7:19 AM

## 2018-11-21 NOTE — Consult Note (Signed)
NAME: Thomas Coffey  DOB: 08-11-1955  MRN: [id number]  Date/Time: 11/21/2018 4:30 PM  REQUESTING PROVIDER:Dr.NIU Subjective:  REASON FOR CONSULT: Empyema with leukocytosis  Patient with history of alcohol use /gout presented to the ED on 11/11/2018 with pain to the chest and back on the right side and epigastric pain.[name]  He was also having shortness of breath.  In the ED the blood work showed acute kidney injury with abnormal LFTs and ultrasound finding suggestive of suspected cholelithiasis with cholecystitis.  He was also found to be in A. fib with RVR.  He was given Cardizem.He had elevated D-dimer and atypical symptoms and hence underwent CT of the chest abdomen and pelvis which showed necrotizing left-sided pneumonia with empyema.  He was initially started on IV Zosyn and vancomycin.  He was seen by surgery.  He was seen by thoracic surgeon Dr. Inez Catalina who recommended IR place a pleural tube which was done on 11/12/2018.  Culture from the fluid showed strep viridans.  On 11/12/2018 underwent MRCP and that showed no evidence of biliary tract obstruction.  No choledocholithiasis.  There was mild hepatic steatosis.  On 11/16/2018 Dr. Inez Catalina placed 10 mg of intrapleural TPA through the tube.  A repeat CT chest done on 11/17/2018 showed near complete resolution of left pleural fluid collection and decreased left lower lobe atelectasis following placement of left pleural drainage catheter.  His antibiotics were changed to ceftriaxone yesterday.  I am asked to see the patient today as his white count seems to be Increasing.  Chart review:He was at the [facility] ED on 11/08/2018 with pain left hip.  An MRI done that time showed no significant hip joint effusion.  But bilateral symmetrical sacroiliitis.  And low-grade partial tear of the left gluteus medius tendon insertion.  The labs that day showed a WBC of 12.1, hemoglobin of 13.1 , sedimentation rate of more than 130, blood cultures were negative.  SARS-CoV-2 was  negative     ?Past medical history Gout  Past surgical history Colon surgery  Social history Smoker Alcohol use No IV drug use   Family History  Problem Relation Age of Onset  . Cancer Mother   . Kidney disease Brother    No Known Allergies  ? Current Facility-Administered Medications  Medication Dose Route Frequency Provider Last Rate Last Dose  . 0.9 %  sodium chloride infusion   Intravenous PRN Campbell Stall, MD 75 mL/hr at 11/18/18 1859    . 0.9 %  sodium chloride infusion   Intravenous Continuous Mayo, Allyn Kenner, MD 75 mL/hr at 11/21/18 0336    . acetaminophen (TYLENOL) tablet 650 mg  650 mg Oral Q6H PRN Campbell Stall, MD   650 mg at 11/20/18 1415  . alteplase (tPA) 10mg  in NS 56mL for Dr.Oaks (intrapleural administration/[facility])   Intrapleural Once 43m, MD   Stopped at 11/15/18 1320  . [START ON 11/22/2018] Ampicillin-Sulbactam (UNASYN) 3 g in sodium chloride 0.9 % 100 mL IVPB  3 g Intravenous Q6H Sarath Privott, MD      . aspirin EC tablet 81 mg  81 mg Oral Daily Mansy, Jan A, MD   81 mg at 11/21/18 0859  . enoxaparin (LOVENOX) injection 40 mg  40 mg Subcutaneous Q24H 13/05/20, MD   40 mg at 11/21/18 1300  . feeding supplement (ENSURE ENLIVE) (ENSURE ENLIVE) liquid 237 mL  237 mL Oral BID BM Vanga, 13/05/20, MD   237 mL at 11/21/18 0900  . folic  acid (FOLVITE) tablet 1 mg  1 mg Oral Daily Mayo, Allyn KennerKaty Dodd, MD   1 mg at 11/21/18 0858  . morphine 4 MG/ML injection 4 mg  4 mg Intravenous Q4H PRN Mayo, Allyn KennerKaty Dodd, MD   4 mg at 11/20/18 0956  . multivitamin with minerals tablet 1 tablet  1 tablet Oral Daily Mayo, Allyn KennerKaty Dodd, MD   1 tablet at 11/21/18 0859  . naphazoline-glycerin (CLEAR EYES REDNESS) ophth solution 1-2 drop  1-2 drop Both Eyes QID PRN Sakai, Isami, DO      . nicotine (NICODERM CQ - dosed in mg/24 hours) patch 21 mg  21 mg Transdermal Daily Valrie HartHall, Scott A, RPH   21 mg at 11/21/18 [id number]  . nitroGLYCERIN (NITROSTAT) SL tablet 0.4  mg  0.4 mg Sublingual Q5 min PRN Mansy, Jan A, MD      . ondansetron (ZOFRAN) tablet 4 mg  4 mg Oral Q6H PRN [place] SirenSainani, Vivek J, MD       Or  . ondansetron (ZOFRAN) injection 4 mg  4 mg Intravenous Q6H PRN [place] SirenSainani, Vivek J, MD      . oxyCODONE (Oxy IR/ROXICODONE) immediate release tablet 5 mg  5 mg Oral Q4H PRN Mayo, Allyn KennerKaty Dodd, MD   5 mg at 11/21/18 1304  . polyvinyl alcohol (LIQUIFILM TEARS) 1.4 % ophthalmic solution 1 drop  1 drop Both Eyes PRN Mayo, Allyn KennerKaty Dodd, MD   1 drop at 11/17/18 0143  . thiamine (VITAMIN B-1) tablet 100 mg  100 mg Oral Daily Mayo, Allyn KennerKaty Dodd, MD   100 mg at 11/20/18 [id number]   Or  . thiamine (B-1) injection 100 mg  100 mg Intravenous Daily Mayo, Allyn KennerKaty Dodd, MD   100 mg at 11/21/18 0900     Abtx:  Anti-infectives (From admission, onward)   Start     Dose/Rate Route Frequency Ordered Stop   11/22/18 0000  Ampicillin-Sulbactam (UNASYN) 3 g in sodium chloride 0.9 % 100 mL IVPB     3 g 200 mL/hr over 30 Minutes Intravenous Every 6 hours 11/21/18 1630     11/20/18 1400  cefTRIAXone (ROCEPHIN) 2 g in sodium chloride 0.9 % 100 mL IVPB     2 g 200 mL/hr over 30 Minutes Intravenous Every 24 hours 11/20/18 1222 11/21/18 1338   11/17/18 1745  levofloxacin (LEVAQUIN) tablet 750 mg     750 mg Oral  Once 11/17/18 1733 11/17/18 1843   11/17/18 0600  vancomycin (VANCOCIN) IVPB 1000 mg/200 mL premix  Status:  Discontinued     1,000 mg 200 mL/hr over 60 Minutes Intravenous Every 12 hours 11/16/18 1848 11/17/18 1319   11/16/18 1800  vancomycin (VANCOCIN) 1,500 mg in sodium chloride 0.9 % 500 mL IVPB     1,500 mg 250 mL/hr over 120 Minutes Intravenous  Once 11/16/18 1717 11/16/18 2023   11/15/18 2200  ceFEPIme (MAXIPIME) 2 g in sodium chloride 0.9 % 100 mL IVPB  Status:  Discontinued     2 g 200 mL/hr over 30 Minutes Intravenous Every 8 hours 11/15/18 1246 11/20/18 1222   11/14/18 1200  ceFEPIme (MAXIPIME) 2 g in sodium chloride 0.9 % 100 mL IVPB  Status:  Discontinued     2 g 200 mL/hr  over 30 Minutes Intravenous Every 12 hours 11/14/18 0801 11/15/18 1246   11/13/18 1000  vancomycin (VANCOCIN) 1,250 mg in sodium chloride 0.9 % 250 mL IVPB  Status:  Discontinued     1,250 mg 166.7 mL/hr over 90 Minutes Intravenous Every  36 hours 11/12/18 0340 11/12/18 0759   11/12/18 0759  vancomycin variable dose per unstable renal function (pharmacist dosing)  Status:  Discontinued      Does not apply See admin instructions 11/12/18 0759 11/12/18 1501   11/12/18 0600  piperacillin-tazobactam (ZOSYN) IVPB 3.375 g  Status:  Discontinued     3.375 g 12.5 mL/hr over 240 Minutes Intravenous Every 8 hours 11/12/18 0332 11/14/18 0740   11/11/18 2230  piperacillin-tazobactam (ZOSYN) IVPB 3.375 g     3.375 g 100 mL/hr over 30 Minutes Intravenous STAT 11/11/18 2205 11/11/18 2257   11/11/18 2000  vancomycin (VANCOCIN) 1,750 mg in sodium chloride 0.9 % 500 mL IVPB     1,750 mg 250 mL/hr over 120 Minutes Intravenous  Once 11/11/18 1944 11/12/18 0045   11/11/18 1945  ceFEPIme (MAXIPIME) 2 g in sodium chloride 0.9 % 100 mL IVPB     2 g 200 mL/hr over 30 Minutes Intravenous  Once 11/11/18 1938 11/11/18 2240   11/11/18 1945  metroNIDAZOLE (FLAGYL) IVPB 500 mg  Status:  Discontinued     500 mg 100 mL/hr over 60 Minutes Intravenous  Once 11/11/18 1938 11/12/18 0332   11/11/18 1945  vancomycin (VANCOCIN) IVPB 1000 mg/200 mL premix  Status:  Discontinued     1,000 mg 200 mL/hr over 60 Minutes Intravenous  Once 11/11/18 1938 11/11/18 1944      REVIEW OF SYSTEMS:  Const: negative fever, negative chills, positive weight loss Eyes: negative diplopia or visual changes, negative eye pain ENT: negative coryza, negative sore throat Resp: Positive cough, no hemoptysis, has dyspnea Cards:  positive for chest pain, palpitations, no lower extremity edema GU: negative for frequency, dysuria and hematuria GI: Negative for abdominal pain, diarrhea, bleeding, constipation Skin: negative for rash and pruritus Heme:  negative for easy bruising and gum/nose bleeding MS: Has pain left leg going from his buttock to left ankle Neurolo:negative for headaches, dizziness, vertigo, memory problems  Psych: negative for feelings of anxiety, depression  Endocrine: negative for thyroid, diabetes Allergy/Immunology- negative for any medication or food allergies ?  Objective:  VITALS:  BP (!) 141/79 (BP Location: Right Arm)   Pulse (!) 101   Temp 98 F (36.7 C) (Oral)   Resp 20   Ht 5\' 9"  (1.753 m)   Wt 72.3 kg   SpO2 100%   BMI 23.55 kg/m  PHYSICAL EXAM:  General: Alert, cooperative, no distress, disheveled.  Chronically ill Head: Normocephalic, without obvious abnormality, atraumatic. Eyes: Conjunctivae clear, anicteric sclerae. Pupils are equal ENT Nares normal. No drainage or sinus tenderness. Lips, mucosa, and tongue normal. No Thrush.  Just one tooth left Neck: Supple, symmetrical, no adenopathy, thyroid: non tender no carotid bruit and no JVD. Back: No CVA tenderness. Lungs: B/l air entry. Decreased left base, chest drain in place on the left side Heart: irregular well controlled  Abdomen: Soft, non-tender,not distended. Bowel sounds normal. No masses Extremities: atraumatic, no cyanosis. No edema. No clubbing Skin: Healed nodular/acneform eruption back. Front of this chest has got scarring.   Lymph: Cervical, supraclavicular normal. Neurologic: Grossly non-focal Pertinent Labs Lab Results C       Component Value Date/Time   WBC 20.9 (H) 11/21/2018 1504   RBC 3.33 (L) 11/21/2018 1504   HGB 10.0 (L) 11/21/2018 1504   HCT 30.8 (L) 11/21/2018 1504   PLT 711 (H) 11/21/2018 1504   MCV 92.5 11/21/2018 1504   MCH 30.0 11/21/2018 1504   MCHC 32.5 11/21/2018 1504   RDW  14.6 11/21/2018 1504   LYMPHSABS 1.7 11/21/2018 1504   MONOABS 1.4 (H) 11/21/2018 1504   EOSABS 0.1 11/21/2018 1504   BASOSABS 0.1 11/21/2018 1504    CMP Latest Ref Rng & Units 11/21/2018 11/20/2018 11/19/2018  Glucose 70  - 99 mg/dL 99 103(H) 122(H)  BUN 8 - 23 mg/dL 5(L) 5(L) 6(L)  Creatinine 0.61 - 1.24 mg/dL 0.80 0.77 0.88  Sodium 135 - 145 mmol/L 138 139 140  Potassium 3.5 - 5.1 mmol/L 3.6 3.4(L) 3.2(L)  Chloride 98 - 111 mmol/L 108 107 106  CO2 22 - 32 mmol/L 22 22 25   Calcium 8.9 - 10.3 mg/dL 8.6(L) 8.3(L) 8.6(L)  Total Protein 6.5 - 8.1 g/dL 5.7(L) - -  Total Bilirubin 0.3 - 1.2 mg/dL 0.6 - -  Alkaline Phos 38 - 126 U/L 116 - -  AST 15 - 41 U/L 63(H) - -  ALT 0 - 44 U/L 77(H) - -      Microbiology: Recent Results (from the past 240 hour(s))  Blood Culture (routine x 2)     Status: None   Collection Time: 11/11/18  6:15 PM   Specimen: BLOOD  Result Value Ref Range Status   Specimen Description BLOOD BLOOD LEFT WRIST  Final   Special Requests   Final    BOTTLES DRAWN AEROBIC AND ANAEROBIC Blood Culture adequate volume   Culture   Final    NO GROWTH 5 DAYS Performed at [facility], [address] [place], [place] [address]    Report Status 11/16/2018 FINAL  Final  SARS CORONAVIRUS 2 (TAT 6-24 HRS) Nasopharyngeal Nasopharyngeal Swab     Status: None   Collection Time: 11/11/18 10:41 PM   Specimen: Nasopharyngeal Swab  Result Value Ref Range Status   SARS Coronavirus 2 NEGATIVE NEGATIVE Final    Comment: (NOTE) SARS-CoV-2 target nucleic acids are NOT DETECTED. The SARS-CoV-2 RNA is generally detectable in upper and lower respiratory specimens during the acute phase of infection. Negative results do not preclude SARS-CoV-2 infection, do not rule out co-infections with other pathogens, and should not be used as the sole basis for treatment or other patient management decisions. Negative results must be combined with clinical observations, patient history, and epidemiological information. The expected result is Negative. Fact Sheet for Patients: SugarRoll.be Fact Sheet for Healthcare Providers: https://www.woods-mathews.com/ This test  is not yet approved or cleared by the [place] FDA and  has been authorized for detection and/or diagnosis of SARS-CoV-2 by FDA under an Emergency Use Authorization (EUA). This EUA will remain  in effect (meaning this test can be used) for the duration of the COVID-19 declaration under Section 56 4(b)(1) of the Act, 21 U.S.C. section 360bbb-3(b)(1), unless the authorization is terminated or revoked sooner. Performed at [facility], [place] [address], [place], [place] [address]   Blood Culture (routine x 2)     Status: None   Collection Time: 11/11/18 11:45 PM   Specimen: BLOOD  Result Value Ref Range Status   Specimen Description BLOOD BLOOD LEFT WRIST  Final   Special Requests   Final    BOTTLES DRAWN AEROBIC AND ANAEROBIC Blood Culture adequate volume   Culture   Final    NO GROWTH 5 DAYS Performed at [facility], [address], [place], [place] [address]    Report Status 11/16/2018 FINAL  Final  MRSA PCR Screening     Status: None   Collection Time: 11/12/18  7:50 AM   Specimen: Nasal Mucosa;  Nasopharyngeal  Result Value Ref Range Status   MRSA by PCR NEGATIVE NEGATIVE Final    Comment:        The GeneXpert MRSA Assay (FDA approved for NASAL specimens only), is one component of a comprehensive MRSA colonization surveillance program. It is not intended to diagnose MRSA infection nor to guide or monitor treatment for MRSA infections. Performed at [facility], [address] [place], [place] [address]   Aerobic/Anaerobic Culture (surgical/deep wound)     Status: None   Collection Time: 11/12/18  5:08 PM   Specimen: Pleural Fluid  Result Value Ref Range Status   Specimen Description   Final    PLEURAL LEFT PLEURAL FLUID Performed at [facility], [address] [place], [place] [address]    Special Requests   Final    NONE Performed at [facility], [address] [place], [place] [address]    Gram Stain   Final     WBC PRESENT,BOTH PMN AND MONONUCLEAR NO ORGANISMS SEEN CYTOSPIN SMEAR    Culture   Final    RARE VIRIDANS STREPTOCOCCUS CRITICAL RESULT CALLED TO, READ BACK BY AND VERIFIED WITH: RN DARLENE LISTOPAD [id number] FCP NO ANAEROBES ISOLATED Performed at [facility], [address] [place], [place] [address]    Report Status 11/19/2018 FINAL  Final   Organism ID, Bacteria VIRIDANS STREPTOCOCCUS  Final      Susceptibility   Viridans streptococcus - MIC*    PENICILLIN 0.25 INTERMEDIATE Intermediate     CEFTRIAXONE 1 SENSITIVE Sensitive     ERYTHROMYCIN >=8 RESISTANT Resistant     LEVOFLOXACIN 1 SENSITIVE Sensitive     VANCOMYCIN 0.5 SENSITIVE Sensitive     * RARE VIRIDANS STREPTOCOCCUS  CULTURE, BLOOD (ROUTINE X 2) w Reflex to ID Panel     Status: None   Collection Time: 11/16/18  5:12 PM   Specimen: BLOOD  Result Value Ref Range Status   Specimen Description BLOOD BLOOD RIGHT WRIST  Final   Special Requests   Final    BOTTLES DRAWN AEROBIC AND ANAEROBIC Blood Culture adequate volume   Culture   Final    NO GROWTH 5 DAYS Performed at [facility], [address] [place], [place] [address]    Report Status 11/21/2018 FINAL  Final  CULTURE, BLOOD (ROUTINE X 2) w Reflex to ID Panel     Status: None   Collection Time: 11/16/18  5:27 PM   Specimen: BLOOD  Result Value Ref Range Status   Specimen Description BLOOD BLOOD LEFT WRIST  Final   Special Requests   Final    BOTTLES DRAWN AEROBIC AND ANAEROBIC Blood Culture results may not be optimal due to an excessive volume of blood received in culture bottles   Culture   Final    NO GROWTH 5 DAYS Performed at [facility], [address] [place], [place] [address]    Report Status 11/21/2018 FINAL  Final    IMAGING RESULTS: 11/1    10/26  I have personally reviewed the films ? Impression/Recommendation ? ?Left lower lobe necrotizing pneumonia with empyema.s/p chest tube placement as well as TPA  instillation.  Repeat CAT scan shows much improvement in the empyema.  Strep viridans in the culture.  This is suggestive of aspiration pneumonia leading to empyema.  Currently patient is on ceftriaxone.  Will change that to Unasyn for better anaerobic coverage as well.  We will follow the leukocytosis.  He will  need minimum 3 weeks of antibiotics.  May be able to transition to p.o. once leukocytosis resolves.  AKI was present on admission but that has resolved now completely.  Could be due to dehydration as well as sepsis.  Abnormal LFTs.  Had very high transaminases.  Now it is improved a lot.  This could be due to sepsis versus drugs versus underlying alcohol induced changes.  Is much improved now.   History of gout.  Alcohol excess with malnutrition.  Stop getting multivitamin.  On 11/08/2018 the MRI done at [facility] showed left gluteal tendon partial tear and also bilateral sacroiliitis. With acneform eruption cystic scars on his back could he have SAPHO ___________________________________________________ Discussed with patient, requesting provider Note:  This document was prepared using Dragon voice recognition software and may include unintentional dictation errors.

## 2018-11-22 ENCOUNTER — Inpatient Hospital Stay: Payer: Medicaid Other

## 2018-11-22 DIAGNOSIS — R7989 Other specified abnormal findings of blood chemistry: Secondary | ICD-10-CM | POA: Insufficient documentation

## 2018-11-22 DIAGNOSIS — R945 Abnormal results of liver function studies: Secondary | ICD-10-CM | POA: Insufficient documentation

## 2018-11-22 LAB — COMPREHENSIVE METABOLIC PANEL
ALT: 58 U/L — ABNORMAL HIGH (ref 0–44)
AST: 43 U/L — ABNORMAL HIGH (ref 15–41)
Albumin: 2.1 g/dL — ABNORMAL LOW (ref 3.5–5.0)
Alkaline Phosphatase: 103 U/L (ref 38–126)
Anion gap: 9 (ref 5–15)
BUN: <5 mg/dL — ABNORMAL LOW (ref 8–23)
CO2: 21 mmol/L — ABNORMAL LOW (ref 22–32)
Calcium: 8.4 mg/dL — ABNORMAL LOW (ref 8.9–10.3)
Chloride: 108 mmol/L (ref 98–111)
Creatinine, Ser: 0.74 mg/dL (ref 0.61–1.24)
GFR calc Af Amer: >60 mL/min (ref >60)
GFR calc non Af Amer: >60 mL/min (ref >60)
Glucose, Bld: 98 mg/dL (ref 70–99)
Potassium: 3.4 mmol/L — ABNORMAL LOW (ref 3.5–5.1)
Sodium: 138 mmol/L (ref 135–145)
Total Bilirubin: 0.8 mg/dL (ref 0.3–1.2)
Total Protein: 5.8 g/dL — ABNORMAL LOW (ref 6.5–8.1)

## 2018-11-22 NOTE — Progress Notes (Signed)
Encourage patient to ambulate, patient is refusing for now.

## 2018-11-22 NOTE — Progress Notes (Signed)
To xray via bed 

## 2018-11-22 NOTE — Progress Notes (Signed)
Brief Progress Note Patient case discussed with Dr Genevive Bi and surgery team. He has remained afebrile although has had slowly rising leukocytosis over the last 4-5 days (15.3 >> 17.7 >> 19.5 >> 21.5 >> 20.9). Repeat imaging earlier this week showed near resolution of left pleural effusion and I am not certain his rising leukocytosis is attributable to his pulmonary disease. Chest tube output remains low and serous in appearance. Evaluated by ID on 11/05 - recommending switching to IV Unasyn and trending leukocytosis. Dr Genevive Bi (thoracic surgery) will be back on Monday.   Plan:  - Repeat 2V CXR today to ensure continued improvement in left pleural effusion and no worsening process that explains leukocytosis - Continue chest tube to water seal; monitor output - Agree with IV Abx & ID recommendations; monitor leukocytosis + fever curve - If he deteriorates from a pulmonary standpoint or develops fevers over the weekend he may need xfer to [facility] for evaluation by their thoracic service. I discussed case with them on Monday and they agreeable to see in consult - Dr Genevive Bi will be back Monday to evaluate  -- Edison Simon, PA-C [facility] 11/22/2018, 11:42 AM [phone] M-F: 7am - 4pm

## 2018-11-22 NOTE — Progress Notes (Signed)
PROGRESS NOTE    Thomas Coffey  XTG:[id number] DOB: 1955/06/18 DOA: 11/11/2018 PCP: Patient, [name]    Brief Narrative:  Is a 63 year old white male with history of tobacco and alcohol abuse presented to the hospital complaining of vague epigastric right upper quadrant and chest pain. In the ER patient underwent blood work which showed acute kidney injury, abnormal LFTs, and ultrasound findings suggestive of suspected cholelithiasis with possible cholecystitis. Is also known to be in A. fib RVR. As part of his work-up in the ER he was found to have an elevated D-dimer and underwent a CTA of chest which was suggestive of a necrotizing left-sided pneumonia with empyema. He was sent to the hospital service admitted for further eval and treatment.  Interim History: -Pleural fluid culture grow RARE VIRIDANS STREPTOCOCCUS. narrowed down abx from cefepime to rocephine 11/4. Bx negative so far. -WBC trending up 17.7 -->19.5 -->21.5 -->20.9 today.  - Consult ID 11/5 -->changed Abx to   Assessment & Plan:   Principal Problem:   Empyema lung (HCC) Active Problems:   Acute hepatitis   Atrial fibrillation with RVR (HCC)   Alcohol abuse   Tobacco dependence syndrome   Atrial fibrillation with rapid ventricular response (HCC)   Empyema (HCC)   AKI (acute kidney injury) (HCC)   Left sided empyema- meeting sepsis criteria on admission with tachycardia, leukocytosis, and lactic acidosis, all of which are improving. Body fluid cultures positive for RARE VIRIDANS STREPTOCOCCUS. Narrowed down from cefepime to rocephine 11/4. WBC trending up 17.7 -->19.5 -->21.5 -->20.9 today. Consulted ID 11/5. Dr. Rivka Safer evaluated pt. Changed Abx to Unasyn for better anaerobic coverage, will need minimum 3 weeks of antibiotics.  May be able to transition to p.o. once leukocytosis resolves per Dr. Rivka Safer.  -Highly appreciate Dr. Lynne Logan recommendation. -Unasyn 11/6 -s/p CT-guided left  chest tube placement 10/27 -Cardiothoracic surgery following-patient will receivedhis 3rd round ofintrapleural thrombolytics10/31 -repeat CT chest November 1 showed significant improvement-discussed the case with cardiothoracic recommendations for continued chest tube management and IV antibiotics. -If patient declines recommend transfer to [facility] Case has been discussed with [facility]. -Blood cultures with no growth -Hold off pulling out chest tube now  Acute hepatitis:likelydue to ischemic hepatitis in the setting of sepsis vs alcoholic hepatitis. Improving. GI was consulted. -RUQ ultrasound was suggestive of cholelithiasis and showed some possible gallbladder wall thickening, but doubt cholecystitis without any right upper quadrant pain-patient still without pain -MRCP performed 10/27 with no evidence of biliary tract obstruction -Acute hepatitis panel was negative -Avoid hepatotoxins -Monitor  A. fib with RVR- patient had one episode on admission, likely precipitated byhispneumonia. He has been in NSR since then. CHADSVASc is a 0. -Noplans to place patient on long term anticoagulation -ECHO with EF 60-65%, mild LVH -Cardiac monitoring -on ASA  Alcohol abuse- drinks 4 beers and 1/2 pint of liquor per day. -StoppedCIWA 10/31, as patient has been asymptomatic for 5 days -Continue withMVI, thiamine, folate  Tobacco use -Nicotine patch daily -Provided tobacco cessation counseling   DVT prophylaxis: Lovenos  Code Status: fall Family Communication: none.  Disposition Plan: not ready for discharge. Still has chest tube with draining.   Consultants:   CT surgeon  surgery  Procedures:  S/p of MRCP  S/p of  CT-guided left chest tube placement 10/27  Antimicrobials: Anti-infectives (From admission, onward)   Start     Dose/Rate Route Frequency Ordered Stop   11/22/18 0000  Ampicillin-Sulbactam (UNASYN) 3 g in  sodium chloride 0.9 %  100 mL IVPB     3 g 200 mL/hr over 30 Minutes Intravenous Every 6 hours 11/21/18 1630     11/20/18 1400  cefTRIAXone (ROCEPHIN) 2 g in sodium chloride 0.9 % 100 mL IVPB     2 g 200 mL/hr over 30 Minutes Intravenous Every 24 hours 11/20/18 1222 11/21/18 2229   11/17/18 1745  levofloxacin (LEVAQUIN) tablet 750 mg     750 mg Oral  Once 11/17/18 1733 11/17/18 1843   11/17/18 0600  vancomycin (VANCOCIN) IVPB 1000 mg/200 mL premix  Status:  Discontinued     1,000 mg 200 mL/hr over 60 Minutes Intravenous Every 12 hours 11/16/18 1848 11/17/18 1319   11/16/18 1800  vancomycin (VANCOCIN) 1,500 mg in sodium chloride 0.9 % 500 mL IVPB     1,500 mg 250 mL/hr over 120 Minutes Intravenous  Once 11/16/18 1717 11/16/18 2023   11/15/18 2200  ceFEPIme (MAXIPIME) 2 g in sodium chloride 0.9 % 100 mL IVPB  Status:  Discontinued     2 g 200 mL/hr over 30 Minutes Intravenous Every 8 hours 11/15/18 1246 11/20/18 1222   11/14/18 1200  ceFEPIme (MAXIPIME) 2 g in sodium chloride 0.9 % 100 mL IVPB  Status:  Discontinued     2 g 200 mL/hr over 30 Minutes Intravenous Every 12 hours 11/14/18 0801 11/15/18 1246   11/13/18 1000  vancomycin (VANCOCIN) 1,250 mg in sodium chloride 0.9 % 250 mL IVPB  Status:  Discontinued     1,250 mg 166.7 mL/hr over 90 Minutes Intravenous Every 36 hours 11/12/18 0340 11/12/18 0759   11/12/18 0759  vancomycin variable dose per unstable renal function (pharmacist dosing)  Status:  Discontinued      Does not apply See admin instructions 11/12/18 0759 11/12/18 1501   11/12/18 0600  piperacillin-tazobactam (ZOSYN) IVPB 3.375 g  Status:  Discontinued     3.375 g 12.5 mL/hr over 240 Minutes Intravenous Every 8 hours 11/12/18 0332 11/14/18 0740   11/11/18 2230  piperacillin-tazobactam (ZOSYN) IVPB 3.375 g     3.375 g 100 mL/hr over 30 Minutes Intravenous STAT 11/11/18 2205 11/11/18 2257   11/11/18 2000  vancomycin (VANCOCIN) 1,750 mg in sodium chloride 0.9 % 500 mL IVPB     1,750 mg 250 mL/hr  over 120 Minutes Intravenous  Once 11/11/18 1944 11/12/18 0045   11/11/18 1945  ceFEPIme (MAXIPIME) 2 g in sodium chloride 0.9 % 100 mL IVPB     2 g 200 mL/hr over 30 Minutes Intravenous  Once 11/11/18 1938 11/11/18 2240   11/11/18 1945  metroNIDAZOLE (FLAGYL) IVPB 500 mg  Status:  Discontinued     500 mg 100 mL/hr over 60 Minutes Intravenous  Once 11/11/18 1938 11/12/18 0332   11/11/18 1945  vancomycin (VANCOCIN) IVPB 1000 mg/200 mL premix  Status:  Discontinued     1,000 mg 200 mL/hr over 60 Minutes Intravenous  Once 11/11/18 1938 11/11/18 1944         Subjective:  has mild pain in left side of chest. Has mild cough with white mucus production. No SOB. No fever or chills  Objective: Vitals:   11/22/18 0514 11/22/18 0823 11/22/18 1606 11/22/18 2104  BP: 136/77 129/81 (!) 149/79 140/75  Pulse: 98 90 93 94  Resp: 18 18 18 19   Temp: 99.1 F (37.3 C) 98.7 F (37.1 C) 99.1 F (37.3 C) 99.3 F (37.4 C)  TempSrc: Oral Oral Oral Oral  SpO2: 96% 97% 97% 96%  Weight:      Height:        Intake/Output Summary (Last 24 hours) at 11/22/2018 2115 Last data filed at 11/22/2018 2059 Gross per 24 hour  Intake 4187.48 ml  Output 2000 ml  Net 2187.48 ml   Filed Weights   11/18/18 0458 11/19/18 0317 11/21/18 0457  Weight: 70.9 kg 72.4 kg 72.3 kg    Examination: Physical Exam:  General: Not in acute distress HEENT: PERRL, EOMI, no scleral icterus, No JVD or bruit Cardiac: S1/S2, RRR, No murmurs, gallops or rubs Pulm: Clear to auscultation bilaterally. No rales, wheezing, rhonchi or rubs. Has left lateral chest tube in place. Has some tenderness around chest tube. Abd: Soft, nondistended, nontender, no rebound pain, no organomegaly, BS present Ext: No edema. 2+DP/PT pulse bilaterally Musculoskeletal: No joint deformities, erythema, or stiffness, ROM full Skin: No rashes.  Neuro: Alert and oriented X3, cranial nerves II-XII grossly intact. Psych: Patient is not psychotic, no  suicidal or hemocidal ideation.    Data Reviewed: I have personally reviewed following labs and imaging studies  CBC: Recent Labs  Lab 11/17/18 0446 11/19/18 0918 11/20/18 0620 11/21/18 0442 11/21/18 1504  WBC 15.3* 17.7* 19.5* 21.5* 20.9*  NEUTROABS  --  14.9* 15.7* 18.0* 17.4*  HGB 10.6* 11.3* 9.7* 9.7* 10.0*  HCT 32.1* 34.5* 29.5* 29.2* 30.8*  MCV 94.1 94.5 92.8 91.0 92.5  PLT 489* 755* 696* 721* 578*   Basic Metabolic Panel: Recent Labs  Lab 11/17/18 0446 11/18/18 0440 11/19/18 0918 11/20/18 0620 11/21/18 0442 11/22/18 0620  NA 140  --  140 139 138 138  K 3.3*  --  3.2* 3.4* 3.6 3.4*  CL 106  --  106 107 108 108  CO2 24  --  25 22 22  21*  GLUCOSE 104*  --  122* 103* 99 98  BUN 7*  --  6* 5* 5* <5*  CREATININE 0.81 0.89 0.88 0.77 0.80 0.74  CALCIUM 8.2*  --  8.6* 8.3* 8.6* 8.4*   GFR: Estimated Creatinine Clearance: 94.5 mL/min (by C-G formula based on SCr of 0.74 mg/dL). Liver Function Tests: Recent Labs  Lab 11/16/18 0531 11/17/18 0446 11/21/18 0442 11/22/18 0620  AST 84* 55* 63* 43*  ALT 139* 93* 77* 58*  ALKPHOS 158* 125 116 103  BILITOT 1.1 1.5* 0.6 0.8  PROT 5.6* 5.2* 5.7* 5.8*  ALBUMIN 2.0* 1.9* 2.0* 2.1*   No results for input(s): LIPASE, AMYLASE in the last 168 hours. No results for input(s): AMMONIA in the last 168 hours. Coagulation Profile: No results for input(s): INR, PROTIME in the last 168 hours. Cardiac Enzymes: No results for input(s): CKTOTAL, CKMB, CKMBINDEX, TROPONINI in the last 168 hours. BNP (last 3 results) No results for input(s): PROBNP in the last 8760 hours. HbA1C: No results for input(s): HGBA1C in the last 72 hours. CBG: No results for input(s): GLUCAP in the last 168 hours. Lipid Profile: No results for input(s): CHOL, HDL, LDLCALC, TRIG, CHOLHDL, LDLDIRECT in the last 72 hours. Thyroid Function Tests: No results for input(s): TSH, T4TOTAL, FREET4, T3FREE, THYROIDAB in the last 72 hours. Anemia Panel: No  results for input(s): VITAMINB12, FOLATE, FERRITIN, TIBC, IRON, RETICCTPCT in the last 72 hours. Sepsis Labs: No results for input(s): PROCALCITON, LATICACIDVEN in the last 168 hours.  Recent Results (from the past 240 hour(s))  CULTURE, BLOOD (ROUTINE X 2) w Reflex to ID Panel     Status: None   Collection Time: 11/16/18  5:12 PM   Specimen: BLOOD  Result Value Ref Range Status   Specimen Description BLOOD BLOOD RIGHT WRIST  Final   Special Requests   Final    BOTTLES DRAWN AEROBIC AND ANAEROBIC Blood Culture adequate volume   Culture   Final    NO GROWTH 5 DAYS Performed at [facility], [address] [place], [place] [address]    Report Status 11/21/2018 FINAL  Final  CULTURE, BLOOD (ROUTINE X 2) w Reflex to ID Panel     Status: None   Collection Time: 11/16/18  5:27 PM   Specimen: BLOOD  Result Value Ref Range Status   Specimen Description BLOOD BLOOD LEFT WRIST  Final   Special Requests   Final    BOTTLES DRAWN AEROBIC AND ANAEROBIC Blood Culture results may not be optimal due to an excessive volume of blood received in culture bottles   Culture   Final    NO GROWTH 5 DAYS Performed at [facility], [address], [place], [place] [address]    Report Status 11/21/2018 FINAL  Final     RN Pressure Injury Documentation:     Estimated body mass index is 23.55 kg/m as calculated from the following:   Height as of this encounter: 5\' 9"  (1.753 m).   Weight as of this encounter: 72.3 kg.  Malnutrition Type:  Nutrition Problem: Severe Malnutrition Etiology: social / environmental circumstances(etoh abuse)   Malnutrition Characteristics:  Signs/Symptoms: moderate muscle depletion, severe muscle depletion, moderate fat depletion, severe fat depletion   Nutrition Interventions:  Interventions: Ensure Enlive (each supplement provides 350kcal and 20 grams of protein), Magic cup, MVI        Radiology Studies: Dg Chest 2 View  Result Date:  11/22/2018 CLINICAL DATA:  Smoker, pleural effusion on LEFT, chest tube EXAM: CHEST - 2 VIEW COMPARISON:  11/20/2018 Correlation: CT chest 11/17/2018 FINDINGS: Pigtail LEFT thoracostomy tube. Normal heart size, mediastinal contours, and pulmonary vascularity. Atherosclerotic calcification aorta. Small residual LEFT pleural effusion. No acute infiltrate, RIGHT pleural effusion, or pneumothorax. Probable LEFT nipple shadow, no pulmonary nodule seen at this site on recent CT. 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 IMPRESSION: LEFT thoracostomy tube with minimal residual LEFT pleural effusion. Probable LEFT nipple shadow as above. Otherwise negative exam Electronically Signed   By: 13/01/2018 M.D.   On: 11/22/2018 13:35        Scheduled Meds: . alteplase (tPA) 10mg  in NS 66mL for Dr.Oaks (intrapleural administration/[facility])   Intrapleural Once  . aspirin EC  81 mg Oral Daily  . enoxaparin (LOVENOX) injection  40 mg Subcutaneous Q24H  . feeding supplement (ENSURE ENLIVE)  237 mL Oral BID BM  . folic acid  1 mg Oral Daily  . multivitamin with minerals  1 tablet Oral Daily  . nicotine  21 mg Transdermal Daily  . thiamine  100 mg Oral Daily   Or  . thiamine  100 mg Intravenous Daily   Continuous Infusions: . sodium chloride 75 mL/hr at 11/18/18 1859  . ampicillin-sulbactam (UNASYN) IV 3 g (11/22/18 1829)     LOS: 11 days    Time spent: 30 min    13/02/20, DO [facility] PAGER is on AMION  If 7PM-7AM, please contact night-coverage [email or web address] Password [email or web address] 11/22/2018, 9:15 PM

## 2018-11-22 NOTE — Progress Notes (Signed)
Back from xray

## 2018-11-22 NOTE — Plan of Care (Signed)
  Problem: Education: Goal: Knowledge of General Education information will improve Description: Including pain rating scale, medication(s)/side effects and non-pharmacologic comfort measures Outcome: Progressing   Problem: Health Behavior/Discharge Planning: Goal: Ability to manage health-related needs will improve Outcome: Progressing   Problem: Clinical Measurements: Goal: Ability to maintain clinical measurements within normal limits will improve Outcome: Progressing Goal: Diagnostic test results will improve Outcome: Progressing Goal: Respiratory complications will improve Outcome: Progressing Note: Lungs remain diminished Goal: Cardiovascular complication will be avoided Outcome: Progressing   Problem: Nutrition: Goal: Adequate nutrition will be maintained Outcome: Progressing   Problem: Coping: Goal: Level of anxiety will decrease Outcome: Progressing   Problem: Elimination: Goal: Will not experience complications related to bowel motility Outcome: Progressing Goal: Will not experience complications related to urinary retention Outcome: Progressing   Problem: Pain Managment: Goal: General experience of comfort will improve Outcome: Progressing Note: PRN medications   Problem: Clinical Measurements: Goal: Will remain free from infection Outcome: Not Progressing Note: WBC 20.9, remains on IV antibotics

## 2018-11-23 DIAGNOSIS — J90 Pleural effusion, not elsewhere classified: Secondary | ICD-10-CM | POA: Diagnosis present

## 2018-11-23 DIAGNOSIS — E43 Unspecified severe protein-calorie malnutrition: Secondary | ICD-10-CM

## 2018-11-23 LAB — CBC
HCT: 28.3 % — ABNORMAL LOW (ref 39.0–52.0)
Hemoglobin: 9.6 g/dL — ABNORMAL LOW (ref 13.0–17.0)
MCH: 30.3 pg (ref 26.0–34.0)
MCHC: 33.9 g/dL (ref 30.0–36.0)
MCV: 89.3 fL (ref 80.0–100.0)
Platelets: 720 10*3/uL — ABNORMAL HIGH (ref 150–400)
RBC: 3.17 MIL/uL — ABNORMAL LOW (ref 4.22–5.81)
RDW: 14.7 % (ref 11.5–15.5)
WBC: 13.5 10*3/uL — ABNORMAL HIGH (ref 4.0–10.5)
nRBC: 0 % (ref 0.0–0.2)

## 2018-11-23 LAB — BASIC METABOLIC PANEL
Anion gap: 9 (ref 5–15)
BUN: 5 mg/dL — ABNORMAL LOW (ref 8–23)
CO2: 23 mmol/L (ref 22–32)
Calcium: 8.8 mg/dL — ABNORMAL LOW (ref 8.9–10.3)
Chloride: 107 mmol/L (ref 98–111)
Creatinine, Ser: 0.79 mg/dL (ref 0.61–1.24)
GFR calc Af Amer: >60 mL/min (ref >60)
GFR calc non Af Amer: >60 mL/min (ref >60)
Glucose, Bld: 100 mg/dL — ABNORMAL HIGH (ref 70–99)
Potassium: 3.6 mmol/L (ref 3.5–5.1)
Sodium: 139 mmol/L (ref 135–145)

## 2018-11-23 NOTE — Progress Notes (Signed)
Earlier tonight around 0140, Nurse d/c'd peripheral IV for infiltrated and would not allow IV antibiotics to run through. Patient states it was tender but no redness noted and very mild swelling noted and would not flush. IV team put in a new peripheral IV. Nurse went to reconnect IV antibiotic that the previous Nurse had started but patient refused. Explained the importance of getting medication and patient states he does not want at that time. Explained to patient that he has another dose in the morning and asked will he at least take that and agreed to allow Nurse administer IV antibiotic med at that time. Currently patient states he is not in pain at this time when asked. Will continue to monitor to end of shift.

## 2018-11-23 NOTE — Progress Notes (Signed)
PROGRESS NOTE    Thomas Coffey  TFT:[id number] DOB: 1955/11/27 DOA: 11/11/2018 PCP: Patient, [name]    Brief Narrative:  Is a 63 year old white male with history of tobacco and alcohol abuse presented to the hospital complaining of vague epigastric right upper quadrant and chest pain. In the ER patient underwent blood work which showed acute kidney injury, abnormal LFTs, and ultrasound findings suggestive of suspected cholelithiasis with possible cholecystitis. Is also known to be in A. fib RVR. As part of his work-up in the ER he was found to have an elevated D-dimer and underwent a CTA of chest which was suggestive of a necrotizing left-sided pneumonia with empyema. He was sent to the hospital service admitted for further eval and treatment.  Interim History: -Pleural fluid culture grow RARE VIRIDANS STREPTOCOCCUS. narrowed down abx from cefepime to rocephine 11/4. Bx negative so far. -WBC trending up 17.7 -->19.5 -->21.5 -->20.9-->13.5 today.  - Consult ID 11/5 -->changed Abx to [facility] 11/6  Assessment & Plan:   Principal Problem:   Empyema lung (HCC) Active Problems:   Acute hepatitis   Atrial fibrillation with RVR (HCC)   Alcohol abuse   Tobacco dependence syndrome   Atrial fibrillation with rapid ventricular response (HCC)   Empyema (HCC)   AKI (acute kidney injury) (HCC)   Liver function test abnormality   Protein-calorie malnutrition, severe   Left sided empyema- meeting sepsis criteria on admission with tachycardia, leukocytosis, and lactic acidosis, all of which are improving. Body fluid cultures positive for RARE VIRIDANS STREPTOCOCCUS. Narrowed down from cefepime to rocephine 11/4. WBC trending up 17.7 -->19.5 -->21.5 -->20.9 -->13.5 today. Consulted ID 11/5. Dr. Rivka Safer evaluated pt. Changed Abx to Unasyn for better anaerobic coverage, will need minimum 3 weeks of antibiotics.  May be able to transition to p.o. once leukocytosis resolves per Dr.  Rivka Safer.  -Highly appreciate Dr. Lynne Logan recommendation. -Unasyn 11/6 -s/p CT-guided left chest tube placement 10/27 -Cardiothoracic surgery following-patient will receivedhis 3rd round ofintrapleural thrombolytics10/31 -repeat CT chest November 1 showed significant improvement-discussed the case with cardiothoracic recommendations for continued chest tube management and IV antibiotics. -If patient declines recommend transfer to [facility] Case has been discussed with [facility]. -Blood cultures with no growth -Hold off pulling out chest tube now  Acute hepatitis:likelydue to ischemic hepatitis in the setting of sepsis vs alcoholic hepatitis. Improving. GI was consulted. -RUQ ultrasound was suggestive of cholelithiasis and showed some possible gallbladder wall thickening, but doubt cholecystitis without any right upper quadrant pain-patient still without pain -MRCP performed 10/27 with no evidence of biliary tract obstruction -Acute hepatitis panel was negative -Avoid hepatotoxins -Monitor  A. fib with RVR- patient had one episode on admission, likely precipitated byhispneumonia. He has been in NSR since then. CHADSVASc is a 0. -Noplans to place patient on long term anticoagulation -ECHO with EF 60-65%, mild LVH -Cardiac monitoring -on ASA  Alcohol abuse- drinks 4 beers and 1/2 pint of liquor per day. -StoppedCIWA 10/31, as patient has been asymptomatic for 5 days -Continue withMVI, thiamine, folate  Tobacco use -Nicotine patch daily -Provided tobacco cessation counseling   DVT prophylaxis: Lovenos  Code Status: fall Family Communication: none.  Disposition Plan: not ready for discharge. Still has chest tube with draining.   Consultants:   CT surgeon  surgery  Procedures:  S/p of MRCP  S/p of  CT-guided left chest tube placement 10/27  Antimicrobials: Anti-infectives (From admission, onward)   Start     Dose/Rate  Route Frequency Ordered Stop   11/22/18 0000  Ampicillin-Sulbactam (UNASYN) 3 g in sodium chloride 0.9 % 100 mL IVPB     3 g 200 mL/hr over 30 Minutes Intravenous Every 6 hours 11/21/18 1630     11/20/18 1400  cefTRIAXone (ROCEPHIN) 2 g in sodium chloride 0.9 % 100 mL IVPB     2 g 200 mL/hr over 30 Minutes Intravenous Every 24 hours 11/20/18 1222 11/21/18 2229   11/17/18 1745  levofloxacin (LEVAQUIN) tablet 750 mg     750 mg Oral  Once 11/17/18 1733 11/17/18 1843   11/17/18 0600  vancomycin (VANCOCIN) IVPB 1000 mg/200 mL premix  Status:  Discontinued     1,000 mg 200 mL/hr over 60 Minutes Intravenous Every 12 hours 11/16/18 1848 11/17/18 1319   11/16/18 1800  vancomycin (VANCOCIN) 1,500 mg in sodium chloride 0.9 % 500 mL IVPB     1,500 mg 250 mL/hr over 120 Minutes Intravenous  Once 11/16/18 1717 11/16/18 2023   11/15/18 2200  ceFEPIme (MAXIPIME) 2 g in sodium chloride 0.9 % 100 mL IVPB  Status:  Discontinued     2 g 200 mL/hr over 30 Minutes Intravenous Every 8 hours 11/15/18 1246 11/20/18 1222   11/14/18 1200  ceFEPIme (MAXIPIME) 2 g in sodium chloride 0.9 % 100 mL IVPB  Status:  Discontinued     2 g 200 mL/hr over 30 Minutes Intravenous Every 12 hours 11/14/18 0801 11/15/18 1246   11/13/18 1000  vancomycin (VANCOCIN) 1,250 mg in sodium chloride 0.9 % 250 mL IVPB  Status:  Discontinued     1,250 mg 166.7 mL/hr over 90 Minutes Intravenous Every 36 hours 11/12/18 0340 11/12/18 0759   11/12/18 0759  vancomycin variable dose per unstable renal function (pharmacist dosing)  Status:  Discontinued      Does not apply See admin instructions 11/12/18 0759 11/12/18 1501   11/12/18 0600  piperacillin-tazobactam (ZOSYN) IVPB 3.375 g  Status:  Discontinued     3.375 g 12.5 mL/hr over 240 Minutes Intravenous Every 8 hours 11/12/18 0332 11/14/18 0740   11/11/18 2230  piperacillin-tazobactam (ZOSYN) IVPB 3.375 g     3.375 g 100 mL/hr over 30 Minutes Intravenous STAT 11/11/18 2205 11/11/18 2257    11/11/18 2000  vancomycin (VANCOCIN) 1,750 mg in sodium chloride 0.9 % 500 mL IVPB     1,750 mg 250 mL/hr over 120 Minutes Intravenous  Once 11/11/18 1944 11/12/18 0045   11/11/18 1945  ceFEPIme (MAXIPIME) 2 g in sodium chloride 0.9 % 100 mL IVPB     2 g 200 mL/hr over 30 Minutes Intravenous  Once 11/11/18 1938 11/11/18 2240   11/11/18 1945  metroNIDAZOLE (FLAGYL) IVPB 500 mg  Status:  Discontinued     500 mg 100 mL/hr over 60 Minutes Intravenous  Once 11/11/18 1938 11/12/18 0332   11/11/18 1945  vancomycin (VANCOCIN) IVPB 1000 mg/200 mL premix  Status:  Discontinued     1,000 mg 200 mL/hr over 60 Minutes Intravenous  Once 11/11/18 1938 11/11/18 1944         Subjective:  has mild pain in left side of chest. Has mild cough with white mucus production. No SOB. No fever or chills  Objective: Vitals:   11/22/18 0823 11/22/18 1606 11/22/18 2104 11/23/18 0516  BP: 129/81 (!) 149/79 140/75 134/83  Pulse: 90 93 94 94  Resp: 18 18 19 20   Temp: 98.7 F (37.1 C) 99.1 F (37.3 C) 99.3 F (37.4 C) 99.1 F (37.3 C)  TempSrc: Oral Oral Oral Oral  SpO2: 97% 97% 96% 98%  Weight:    69.4 kg  Height:        Intake/Output Summary (Last 24 hours) at 11/23/2018 0719 Last data filed at 11/23/2018 7944 Gross per 24 hour  Intake 4487.48 ml  Output 1950 ml  Net 2537.48 ml   Filed Weights   11/19/18 0317 11/21/18 0457 11/23/18 0516  Weight: 72.4 kg 72.3 kg 69.4 kg    Examination: Physical Exam:  General: Not in acute distress HEENT: PERRL, EOMI, no scleral icterus, No JVD or bruit Cardiac: S1/S2, RRR, No murmurs, gallops or rubs Pulm: Clear to auscultation bilaterally. No rales, wheezing, rhonchi or rubs. Has left lateral chest tube in place. Has some tenderness around chest tube. Abd: Soft, nondistended, nontender, no rebound pain, no organomegaly, BS present Ext: No edema. 2+DP/PT pulse bilaterally Musculoskeletal: No joint deformities, erythema, or stiffness, ROM full Skin: No  rashes.  Neuro: Alert and oriented X3, cranial nerves II-XII grossly intact. Psych: Patient is not psychotic, no suicidal or hemocidal ideation.    Data Reviewed: I have personally reviewed following labs and imaging studies  CBC: Recent Labs  Lab 11/19/18 0918 11/20/18 0620 11/21/18 0442 11/21/18 1504 11/23/18 0454  WBC 17.7* 19.5* 21.5* 20.9* 13.5*  NEUTROABS 14.9* 15.7* 18.0* 17.4*  --   HGB 11.3* 9.7* 9.7* 10.0* 9.6*  HCT 34.5* 29.5* 29.2* 30.8* 28.3*  MCV 94.5 92.8 91.0 92.5 89.3  PLT 755* 696* 721* 711* 720*   Basic Metabolic Panel: Recent Labs  Lab 11/19/18 0918 11/20/18 0620 11/21/18 0442 11/22/18 0620 11/23/18 0454  NA 140 139 138 138 139  K 3.2* 3.4* 3.6 3.4* 3.6  CL 106 107 108 108 107  CO2 25 22 22  21* 23  GLUCOSE 122* 103* 99 98 100*  BUN 6* 5* 5* <5* 5*  CREATININE 0.88 0.77 0.80 0.74 0.79  CALCIUM 8.6* 8.3* 8.6* 8.4* 8.8*   GFR: Estimated Creatinine Clearance: 92.8 mL/min (by C-G formula based on SCr of 0.79 mg/dL). Liver Function Tests: Recent Labs  Lab 11/17/18 0446 11/21/18 0442 11/22/18 0620  AST 55* 63* 43*  ALT 93* 77* 58*  ALKPHOS 125 116 103  BILITOT 1.5* 0.6 0.8  PROT 5.2* 5.7* 5.8*  ALBUMIN 1.9* 2.0* 2.1*   No results for input(s): LIPASE, AMYLASE in the last 168 hours. No results for input(s): AMMONIA in the last 168 hours. Coagulation Profile: No results for input(s): INR, PROTIME in the last 168 hours. Cardiac Enzymes: No results for input(s): CKTOTAL, CKMB, CKMBINDEX, TROPONINI in the last 168 hours. BNP (last 3 results) No results for input(s): PROBNP in the last 8760 hours. HbA1C: No results for input(s): HGBA1C in the last 72 hours. CBG: No results for input(s): GLUCAP in the last 168 hours. Lipid Profile: No results for input(s): CHOL, HDL, LDLCALC, TRIG, CHOLHDL, LDLDIRECT in the last 72 hours. Thyroid Function Tests: No results for input(s): TSH, T4TOTAL, FREET4, T3FREE, THYROIDAB in the last 72 hours. Anemia  Panel: No results for input(s): VITAMINB12, FOLATE, FERRITIN, TIBC, IRON, RETICCTPCT in the last 72 hours. Sepsis Labs: No results for input(s): PROCALCITON, LATICACIDVEN in the last 168 hours.  Recent Results (from the past 240 hour(s))  CULTURE, BLOOD (ROUTINE X 2) w Reflex to ID Panel     Status: None   Collection Time: 11/16/18  5:12 PM   Specimen: BLOOD  Result Value Ref Range Status   Specimen Description BLOOD BLOOD RIGHT WRIST  Final   Special Requests  Final    BOTTLES DRAWN AEROBIC AND ANAEROBIC Blood Culture adequate volume   Culture   Final    NO GROWTH 5 DAYS Performed at [facility], [address] [place], [place] [address]    Report Status 11/21/2018 FINAL  Final  CULTURE, BLOOD (ROUTINE X 2) w Reflex to ID Panel     Status: None   Collection Time: 11/16/18  5:27 PM   Specimen: BLOOD  Result Value Ref Range Status   Specimen Description BLOOD BLOOD LEFT WRIST  Final   Special Requests   Final    BOTTLES DRAWN AEROBIC AND ANAEROBIC Blood Culture results may not be optimal due to an excessive volume of blood received in culture bottles   Culture   Final    NO GROWTH 5 DAYS Performed at [facility], [address] [place], [place] [address]    Report Status 11/21/2018 FINAL  Final     RN Pressure Injury Documentation:     Estimated body mass index is 22.58 kg/m as calculated from the following:   Height as of this encounter: 5\' 9"  (1.753 m).   Weight as of this encounter: 69.4 kg.  Malnutrition Type:  Nutrition Problem: Severe Malnutrition Etiology: social / environmental circumstances(etoh abuse)   Malnutrition Characteristics:  Signs/Symptoms: moderate muscle depletion, severe muscle depletion, moderate fat depletion, severe fat depletion   Nutrition Interventions:  Interventions: Ensure Enlive (each supplement provides 350kcal and 20 grams of protein), Magic cup, MVI        Radiology Studies: Dg Chest 2  View  Result Date: 11/22/2018 CLINICAL DATA:  Smoker, pleural effusion on LEFT, chest tube EXAM: CHEST - 2 VIEW COMPARISON:  11/20/2018 Correlation: CT chest 11/17/2018 FINDINGS: Pigtail LEFT thoracostomy tube. Normal heart size, mediastinal contours, and pulmonary vascularity. Atherosclerotic calcification aorta. Small residual LEFT pleural effusion. No acute infiltrate, RIGHT pleural effusion, or pneumothorax. Probable LEFT nipple shadow, no pulmonary nodule seen at this site on recent CT. 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 IMPRESSION: LEFT thoracostomy tube with minimal residual LEFT pleural effusion. Probable LEFT nipple shadow as above. Otherwise negative exam Electronically Signed   By: Lavonia Dana M.D.   On: 11/22/2018 13:35        Scheduled Meds:  alteplase (tPA) 10mg  in NS 57mL for Dr.Oaks (intrapleural administration/[facility])   Intrapleural Once   aspirin EC  81 mg Oral Daily   enoxaparin (LOVENOX) injection  40 mg Subcutaneous Q24H   feeding supplement (ENSURE ENLIVE)  237 mL Oral BID BM   folic acid  1 mg Oral Daily   multivitamin with minerals  1 tablet Oral Daily   nicotine  21 mg Transdermal Daily   thiamine  100 mg Oral Daily   Or   thiamine  100 mg Intravenous Daily   Continuous Infusions:  sodium chloride 75 mL/hr at 11/18/18 1859   ampicillin-sulbactam (UNASYN) IV 3 g (11/23/18 0618)     LOS: 12 days    Time spent: 30 min    Ivor Costa, DO [facility] PAGER is on AMION  If 7PM-7AM, please contact night-coverage [email or web address] Password [facility] 11/23/2018, 7:19 AM

## 2018-11-24 DIAGNOSIS — J850 Gangrene and necrosis of lung: Secondary | ICD-10-CM

## 2018-11-24 DIAGNOSIS — F1010 Alcohol abuse, uncomplicated: Secondary | ICD-10-CM

## 2018-11-24 LAB — BASIC METABOLIC PANEL
Anion gap: 10 (ref 5–15)
BUN: 6 mg/dL — ABNORMAL LOW (ref 8–23)
CO2: 23 mmol/L (ref 22–32)
Calcium: 9.2 mg/dL (ref 8.9–10.3)
Chloride: 107 mmol/L (ref 98–111)
Creatinine, Ser: 0.79 mg/dL (ref 0.61–1.24)
GFR calc Af Amer: >60 mL/min (ref >60)
GFR calc non Af Amer: >60 mL/min (ref >60)
Glucose, Bld: 118 mg/dL — ABNORMAL HIGH (ref 70–99)
Potassium: 4.1 mmol/L (ref 3.5–5.1)
Sodium: 140 mmol/L (ref 135–145)

## 2018-11-24 LAB — MAGNESIUM: Magnesium: 1.6 mg/dL — ABNORMAL LOW (ref 1.7–2.4)

## 2018-11-24 MED ORDER — MAGNESIUM SULFATE 4 GM/100ML IV SOLN
4.0000 g | Freq: Once | INTRAVENOUS | Status: AC
  Administered 2018-11-24: 4 g via INTRAVENOUS
  Filled 2018-11-24: qty 100

## 2018-11-24 NOTE — Progress Notes (Addendum)
Date of Admission:  11/11/2018      ID: Thomas Coffey is a 63 y.o. male  Principal Problem:   Empyema lung (HCC) Active Problems:   Acute hepatitis   Atrial fibrillation with RVR (HCC)   Alcohol abuse   Tobacco dependence syndrome   Atrial fibrillation with rapid ventricular response (HCC)   Empyema (HCC)   AKI (acute kidney injury) (HCC)   Liver function test abnormality   Protein-calorie malnutrition, severe   Pleural effusion on left  Patient with history of alcohol use /gout presented to the ED on 11/11/2018 with pain to the chest and back on the right side and epigastric pain.[name]  He was also having shortness of breath.  In the ED the blood work showed acute kidney injury with abnormal LFTs and ultrasound finding suggestive of suspected cholelithiasis with cholecystitis.  He was also found to be in A. fib with RVR.  He was given Cardizem.He had elevated D-dimer and atypical symptoms and hence underwent CT of the chest abdomen and pelvis which showed necrotizing left-sided pneumonia with empyema.  He was initially started on IV Zosyn and vancomycin.  He was seen by surgery.  He was seen by thoracic surgeon Dr. Inez Catalina who recommended IR place a pleural tube which was done on 11/12/2018.  Culture from the fluid showed strep viridans.  On 11/12/2018 underwent MRCP and that showed no evidence of biliary tract obstruction.  No choledocholithiasis.  There was mild hepatic steatosis.  On 11/16/2018 Dr. Inez Catalina placed 10 mg of intrapleural TPA through the tube.  A repeat CT chest done on 11/17/2018 showed near complete resolution of left pleural fluid collection and decreased left lower lobe atelectasis following placement of left pleural drainage catheter  Subjective: Patient says he is feeling better.  The left side of the chest is not hurting . States he he notices small specks of blood from his nose occasionally  Medications:  . alteplase (tPA) 10mg  in NS 36mL for Dr.Oaks (intrapleural  administration/[facility])   Intrapleural Once  . aspirin EC  81 mg Oral Daily  . enoxaparin (LOVENOX) injection  40 mg Subcutaneous Q24H  . feeding supplement (ENSURE ENLIVE)  237 mL Oral BID BM  . folic acid  1 mg Oral Daily  . multivitamin with minerals  1 tablet Oral Daily  . nicotine  21 mg Transdermal Daily  . thiamine  100 mg Oral Daily   Or  . thiamine  100 mg Intravenous Daily    Objective: Vital signs in last 24 hours: Temp:  [98.3 F (36.8 C)-99.1 F (37.3 C)] 98.3 F (36.8 C) (11/08 1509) Pulse Rate:  [96-104] 96 (11/08 1509) Resp:  [16-19] 19 (11/08 1509) BP: (116-135)/(70-86) 135/86 (11/08 1509) SpO2:  [97 %-99 %] 98 % (11/08 1509) Weight:  [69.4 kg] 69.4 kg (11/08 0600)  PHYSICAL EXAM:  General: Alert, cooperative, no distress, appears stated age.  Head: Normocephalic, without obvious abnormality, atraumatic. Lungs: Bilateral air entry Decreased left base Left-sided chest drain Heart: S1-S2 skin: Healed nodular acneform eruptions  . Neurologic: Grossly non-focal  Lab Results Recent Labs    11/23/18 0454 11/24/18 0550  WBC 13.5*  --   HGB 9.6*  --   HCT 28.3*  --   NA 139 140  K 3.6 4.1  CL 107 107  CO2 23 23  BUN 5* 6*  CREATININE 0.79 0.79   Liver Panel Recent Labs    11/22/18 0620  PROT 5.8*  ALBUMIN 2.1*  AST 43*  ALT 58*  ALKPHOS 103  BILITOT 0.8   Sedimentation Rate No results for input(s): ESRSEDRATE in the last 72 hours. C-Reactive Protein No results for input(s): CRP in the last 72 hours.  Microbiology: Strep viridans in the pleural fluid culture   Assessment/Plan:  Left lower lobe necrotizing pneumonia with empyema.  Status post chest tube placement.  Also had TPA instillation.  Strep viridans in the culture.  Very likely it all started as an aspiration pneumonia leading to empyema.  Currently on Unasyn since 11/21/2018.  Leukocytosis is improving..  While in the hospital continue with IV Unasyn.  He will need a total of 3 to 4  weeks of antibiotics including all the antibiotics that were given in house.  On discharge can go on p.o. Augmentin .  Please discuss  with CT surgery regarding removal of the chest tube.   AKI was present on admission but now has completely resolved  Acute hepatitis: Transaminitis on admission is much improved.  There is an element of underlying alcoholic hepatitis with possible sepsis contributing to the worsening.  A. fib with RVR on admission.  Now in sinus rhythm.  Alcohol abuse  Protein calorie malnutrition  Discussed the management with the patient and  his nurse about observing for any further epistaxis and informing the primary team  ID will sign off now.  Call if needed.

## 2018-11-24 NOTE — Progress Notes (Addendum)
PROGRESS NOTE    Thomas Coffey  TKP:[id number] DOB: 11-Jan-1956 DOA: 11/11/2018 PCP: Patient, [name]    Brief Narrative:  Is a 63 year old white male with history of tobacco and alcohol abuse presented to the hospital complaining of vague epigastric right upper quadrant and chest pain. In the ER patient underwent blood work which showed acute kidney injury, abnormal LFTs, and ultrasound findings suggestive of suspected cholelithiasis with possible cholecystitis. Is also known to be in A. fib RVR. As part of his work-up in the ER he was found to have an elevated D-dimer and underwent a CTA of chest which was suggestive of a necrotizing left-sided pneumonia with empyema. He was sent to the hospital service admitted for further eval and treatment.  Interim History: -Pleural fluid culture grow RARE VIRIDANS STREPTOCOCCUS. narrowed down abx from cefepime to rocephine 11/4. Bx negative so far. -WBC trending up 17.7 -->19.5 -->21.5 -->20.9-->13.5 --> no lab today.  - Consult ID 11/5 -->changed Abx to [facility] 11/6  Assessment & Plan:   Principal Problem:   Empyema lung (HCC) Active Problems:   Acute hepatitis   Atrial fibrillation with RVR (HCC)   Alcohol abuse   Tobacco dependence syndrome   Atrial fibrillation with rapid ventricular response (HCC)   AKI (acute kidney injury) (HCC)   Protein-calorie malnutrition, severe   Pleural effusion on left   Hypomagnesemia   Left sided empyema and left pleural effusion: Meeting sepsis criteria on admission with tachycardia, leukocytosis, and lactic acidosis, all of which are improving. Sepsis resolved. Body fluid cultures positive for RARE VIRIDANS STREPTOCOCCUS. Narrowed down from cefepime to rocephine 11/4. WBC trending up 17.7 -->19.5 -->21.5 -->20.9 -->13.5 --> no lab today. Consulted ID 11/5. Dr. Rivka Safer evaluated pt. Changed Abx to Unasyn for better anaerobic coverage, will need minimum 3 weeks of antibiotics.  May be able to  transition to p.o. once leukocytosis resolves per Dr. Rivka Safer.  -Highly appreciate Dr. Lynne Logan recommendation. -Unasyn 11/6 -s/p CT-guided left chest tube placement 10/27 -Cardiothoracic surgery following-patient will receivedhis 3rd round ofintrapleural thrombolytics10/31 -repeat CT chest November 1 showed significant improvement-discussed the case with cardiothoracic recommendations for continued chest tube management and IV antibiotics. -If patient declines recommend transfer to [facility] Case has been discussed with [facility]. -Blood cultures with no growth -Hold off pulling out chest tube now  Acute hepatitis:likelydue to ischemic hepatitis in the setting of sepsis vs alcoholic hepatitis. Improving. GI was consulted. -RUQ ultrasound was suggestive of cholelithiasis and showed some possible gallbladder wall thickening, but doubt cholecystitis without any right upper quadrant pain-patient still without pain -MRCP performed 10/27 with no evidence of biliary tract obstruction -Acute hepatitis panel was negative -Avoid hepatotoxins -Monitor  A. fib with RVR- patient had one episode on admission, likely precipitated byhispneumonia. He has been in NSR since then. CHADSVASc is a 0. -Noplans to place patient on long term anticoagulation -ECHO with EF 60-65%, mild LVH -Cardiac monitoring -on ASA  Alcohol abuse- drinks 4 beers and 1/2 pint of liquor per day. -StoppedCIWA 10/31, as patient has been asymptomatic for 5 days -Continue withMVI, thiamine, folate  Tobacco use -Nicotine patch daily -Provided tobacco cessation counseling  Protein-calorie malnutrition, severe -Ensurre  AKI: resolved. Cre 0.79 and BUN 6  Hypomagnesemia: Mg 1.6 -4 g of magnesium sulfate by IV   DVT prophylaxis: Lovenos  Code Status: fall Family Communication: none.  Disposition Plan: not ready for discharge. Still has chest tube with draining.    Consultants:  CT surgeon  surgery  Procedures:  S/p of MRCP  S/p of  CT-guided left chest tube placement 10/27  Antimicrobials: Anti-infectives (From admission, onward)   Start     Dose/Rate Route Frequency Ordered Stop   11/22/18 0000  Ampicillin-Sulbactam (UNASYN) 3 g in sodium chloride 0.9 % 100 mL IVPB     3 g 200 mL/hr over 30 Minutes Intravenous Every 6 hours 11/21/18 1630     11/20/18 1400  cefTRIAXone (ROCEPHIN) 2 g in sodium chloride 0.9 % 100 mL IVPB     2 g 200 mL/hr over 30 Minutes Intravenous Every 24 hours 11/20/18 1222 11/21/18 2229   11/17/18 1745  levofloxacin (LEVAQUIN) tablet 750 mg     750 mg Oral  Once 11/17/18 1733 11/17/18 1843   11/17/18 0600  vancomycin (VANCOCIN) IVPB 1000 mg/200 mL premix  Status:  Discontinued     1,000 mg 200 mL/hr over 60 Minutes Intravenous Every 12 hours 11/16/18 1848 11/17/18 1319   11/16/18 1800  vancomycin (VANCOCIN) 1,500 mg in sodium chloride 0.9 % 500 mL IVPB     1,500 mg 250 mL/hr over 120 Minutes Intravenous  Once 11/16/18 1717 11/16/18 2023   11/15/18 2200  ceFEPIme (MAXIPIME) 2 g in sodium chloride 0.9 % 100 mL IVPB  Status:  Discontinued     2 g 200 mL/hr over 30 Minutes Intravenous Every 8 hours 11/15/18 1246 11/20/18 1222   11/14/18 1200  ceFEPIme (MAXIPIME) 2 g in sodium chloride 0.9 % 100 mL IVPB  Status:  Discontinued     2 g 200 mL/hr over 30 Minutes Intravenous Every 12 hours 11/14/18 0801 11/15/18 1246   11/13/18 1000  vancomycin (VANCOCIN) 1,250 mg in sodium chloride 0.9 % 250 mL IVPB  Status:  Discontinued     1,250 mg 166.7 mL/hr over 90 Minutes Intravenous Every 36 hours 11/12/18 0340 11/12/18 0759   11/12/18 0759  vancomycin variable dose per unstable renal function (pharmacist dosing)  Status:  Discontinued      Does not apply See admin instructions 11/12/18 0759 11/12/18 1501   11/12/18 0600  piperacillin-tazobactam (ZOSYN) IVPB 3.375 g  Status:  Discontinued     3.375 g 12.5 mL/hr over 240 Minutes  Intravenous Every 8 hours 11/12/18 0332 11/14/18 0740   11/11/18 2230  piperacillin-tazobactam (ZOSYN) IVPB 3.375 g     3.375 g 100 mL/hr over 30 Minutes Intravenous STAT 11/11/18 2205 11/11/18 2257   11/11/18 2000  vancomycin (VANCOCIN) 1,750 mg in sodium chloride 0.9 % 500 mL IVPB     1,750 mg 250 mL/hr over 120 Minutes Intravenous  Once 11/11/18 1944 11/12/18 0045   11/11/18 1945  ceFEPIme (MAXIPIME) 2 g in sodium chloride 0.9 % 100 mL IVPB     2 g 200 mL/hr over 30 Minutes Intravenous  Once 11/11/18 1938 11/11/18 2240   11/11/18 1945  metroNIDAZOLE (FLAGYL) IVPB 500 mg  Status:  Discontinued     500 mg 100 mL/hr over 60 Minutes Intravenous  Once 11/11/18 1938 11/12/18 0332   11/11/18 1945  vancomycin (VANCOCIN) IVPB 1000 mg/200 mL premix  Status:  Discontinued     1,000 mg 200 mL/hr over 60 Minutes Intravenous  Once 11/11/18 1938 11/11/18 1944         Subjective:  has mild pain in left side of chest. Has mild cough. No SOB. No fever or chills  Objective: Vitals:   11/23/18 1922 11/24/18 0600 11/24/18 0727 11/24/18 1509  BP: 125/70 120/73 116/76 135/86  Pulse: 98 (!) 104 96 96  Resp: 16 16 19 19   Temp: 99.1 F (37.3 C) 98.9 F (37.2 C) 98.5 F (36.9 C) 98.3 F (36.8 C)  TempSrc: Oral Oral Oral Oral  SpO2: 98% 97% 99% 98%  Weight:  69.4 kg    Height:        Intake/Output Summary (Last 24 hours) at 11/24/2018 1648 Last data filed at 11/24/2018 1333 Gross per 24 hour  Intake 1350.33 ml  Output 800 ml  Net 550.33 ml   Filed Weights   11/21/18 0457 11/23/18 0516 11/24/18 0600  Weight: 72.3 kg 69.4 kg 69.4 kg    Examination: Physical Exam:  General: Not in acute distress HEENT: PERRL, EOMI, no scleral icterus, No JVD or bruit Cardiac: S1/S2, RRR, No murmurs, gallops or rubs Pulm: Clear to auscultation bilaterally. No rales, wheezing, rhonchi or rubs. Has left lateral chest tube in place. Has some tenderness around chest tube. Abd: Soft, nondistended, nontender,  no rebound pain, no organomegaly, BS present Ext: No edema. 2+DP/PT pulse bilaterally Musculoskeletal: No joint deformities, erythema, or stiffness, ROM full Skin: No rashes.  Neuro: Alert and oriented X3, cranial nerves II-XII grossly intact. Psych: Patient is not psychotic, no suicidal or hemocidal ideation.    Data Reviewed: I have personally reviewed following labs and imaging studies  CBC: Recent Labs  Lab 11/19/18 0918 11/20/18 0620 11/21/18 0442 11/21/18 1504 11/23/18 0454  WBC 17.7* 19.5* 21.5* 20.9* 13.5*  NEUTROABS 14.9* 15.7* 18.0* 17.4*  --   HGB 11.3* 9.7* 9.7* 10.0* 9.6*  HCT 34.5* 29.5* 29.2* 30.8* 28.3*  MCV 94.5 92.8 91.0 92.5 89.3  PLT 755* 696* 721* 711* 720*   Basic Metabolic Panel: Recent Labs  Lab 11/20/18 0620 11/21/18 0442 11/22/18 0620 11/23/18 0454 11/24/18 0550  NA 139 138 138 139 140  K 3.4* 3.6 3.4* 3.6 4.1  CL 107 108 108 107 107  CO2 22 22 21* 23 23  GLUCOSE 103* 99 98 100* 118*  BUN 5* 5* <5* 5* 6*  CREATININE 0.77 0.80 0.74 0.79 0.79  CALCIUM 8.3* 8.6* 8.4* 8.8* 9.2  MG  --   --   --   --  1.6*   GFR: Estimated Creatinine Clearance: 92.8 mL/min (by C-G formula based on SCr of 0.79 mg/dL). Liver Function Tests: Recent Labs  Lab 11/21/18 0442 11/22/18 0620  AST 63* 43*  ALT 77* 58*  ALKPHOS 116 103  BILITOT 0.6 0.8  PROT 5.7* 5.8*  ALBUMIN 2.0* 2.1*   No results for input(s): LIPASE, AMYLASE in the last 168 hours. No results for input(s): AMMONIA in the last 168 hours. Coagulation Profile: No results for input(s): INR, PROTIME in the last 168 hours. Cardiac Enzymes: No results for input(s): CKTOTAL, CKMB, CKMBINDEX, TROPONINI in the last 168 hours. BNP (last 3 results) No results for input(s): PROBNP in the last 8760 hours. HbA1C: No results for input(s): HGBA1C in the last 72 hours. CBG: No results for input(s): GLUCAP in the last 168 hours. Lipid Profile: No results for input(s): CHOL, HDL, LDLCALC, TRIG, CHOLHDL,  LDLDIRECT in the last 72 hours. Thyroid Function Tests: No results for input(s): TSH, T4TOTAL, FREET4, T3FREE, THYROIDAB in the last 72 hours. Anemia Panel: No results for input(s): VITAMINB12, FOLATE, FERRITIN, TIBC, IRON, RETICCTPCT in the last 72 hours. Sepsis Labs: No results for input(s): PROCALCITON, LATICACIDVEN in the last 168 hours.  Recent Results (from the past 240 hour(s))  CULTURE, BLOOD (ROUTINE X 2) w Reflex to ID Panel  Status: None   Collection Time: 11/16/18  5:12 PM   Specimen: BLOOD  Result Value Ref Range Status   Specimen Description BLOOD BLOOD RIGHT WRIST  Final   Special Requests   Final    BOTTLES DRAWN AEROBIC AND ANAEROBIC Blood Culture adequate volume   Culture   Final    NO GROWTH 5 DAYS Performed at [facility], [address] [place], [place] [address]    Report Status 11/21/2018 FINAL  Final  CULTURE, BLOOD (ROUTINE X 2) w Reflex to ID Panel     Status: None   Collection Time: 11/16/18  5:27 PM   Specimen: BLOOD  Result Value Ref Range Status   Specimen Description BLOOD BLOOD LEFT WRIST  Final   Special Requests   Final    BOTTLES DRAWN AEROBIC AND ANAEROBIC Blood Culture results may not be optimal due to an excessive volume of blood received in culture bottles   Culture   Final    NO GROWTH 5 DAYS Performed at [facility], [address] [place], [place] [address]    Report Status 11/21/2018 FINAL  Final     RN Pressure Injury Documentation:     Estimated body mass index is 22.58 kg/m as calculated from the following:   Height as of this encounter: 5\' 9"  (1.753 m).   Weight as of this encounter: 69.4 kg.  Malnutrition Type:  Nutrition Problem: Severe Malnutrition Etiology: social / environmental circumstances(etoh abuse)   Malnutrition Characteristics:  Signs/Symptoms: moderate muscle depletion, severe muscle depletion, moderate fat depletion, severe fat depletion   Nutrition Interventions:   Interventions: Ensure Enlive (each supplement provides 350kcal and 20 grams of protein), Magic cup, MVI        Radiology Studies: No results found.      Scheduled Meds: . alteplase (tPA) 10mg  in NS 67mL for Dr.Oaks (intrapleural administration/[facility])   Intrapleural Once  . aspirin EC  81 mg Oral Daily  . enoxaparin (LOVENOX) injection  40 mg Subcutaneous Q24H  . feeding supplement (ENSURE ENLIVE)  237 mL Oral BID BM  . folic acid  1 mg Oral Daily  . multivitamin with minerals  1 tablet Oral Daily  . nicotine  21 mg Transdermal Daily  . thiamine  100 mg Oral Daily   Or  . thiamine  100 mg Intravenous Daily   Continuous Infusions: . sodium chloride 75 mL/hr at 11/18/18 1859  . ampicillin-sulbactam (UNASYN) IV 3 g (11/24/18 1419)     LOS: 13 days    Time spent: 30 min    Lorretta Harp, DO [facility] PAGER is on AMION  If 7PM-7AM, please contact night-coverage [email or web address] Password [facility] 11/24/2018, 4:48 PM

## 2018-11-25 LAB — CBC
HCT: 30.9 % — ABNORMAL LOW (ref 39.0–52.0)
Hemoglobin: 10.2 g/dL — ABNORMAL LOW (ref 13.0–17.0)
MCH: 29.7 pg (ref 26.0–34.0)
MCHC: 33 g/dL (ref 30.0–36.0)
MCV: 90.1 fL (ref 80.0–100.0)
Platelets: 654 10*3/uL — ABNORMAL HIGH (ref 150–400)
RBC: 3.43 MIL/uL — ABNORMAL LOW (ref 4.22–5.81)
RDW: 15.3 % (ref 11.5–15.5)
WBC: 9.7 10*3/uL (ref 4.0–10.5)
nRBC: 0 % (ref 0.0–0.2)

## 2018-11-25 LAB — BASIC METABOLIC PANEL
Anion gap: 8 (ref 5–15)
BUN: 6 mg/dL — ABNORMAL LOW (ref 8–23)
CO2: 24 mmol/L (ref 22–32)
Calcium: 9 mg/dL (ref 8.9–10.3)
Chloride: 104 mmol/L (ref 98–111)
Creatinine, Ser: 0.69 mg/dL (ref 0.61–1.24)
GFR calc Af Amer: >60 mL/min (ref >60)
GFR calc non Af Amer: >60 mL/min (ref >60)
Glucose, Bld: 90 mg/dL (ref 70–99)
Potassium: 4.3 mmol/L (ref 3.5–5.1)
Sodium: 136 mmol/L (ref 135–145)

## 2018-11-25 LAB — MAGNESIUM: Magnesium: 1.7 mg/dL (ref 1.7–2.4)

## 2018-11-25 MED ORDER — AMOXICILLIN-POT CLAVULANATE 875-125 MG PO TABS
1.0000 | ORAL_TABLET | Freq: Two times a day (BID) | ORAL | Status: DC
  Administered 2018-11-25: 1 via ORAL
  Filled 2018-11-25: qty 1

## 2018-11-25 MED ORDER — ACETAMINOPHEN 325 MG PO TABS: 650.0000 mg | ORAL_TABLET | Freq: Four times a day (QID) | ORAL | 1 refills | Status: DC | PRN

## 2018-11-25 MED ORDER — AMOXICILLIN-POT CLAVULANATE 875-125 MG PO TABS: 1.0000 | ORAL_TABLET | Freq: Two times a day (BID) | ORAL | 0 refills | Status: DC

## 2018-11-25 MED ORDER — THIAMINE HCL 100 MG PO TABS: 100.0000 mg | ORAL_TABLET | Freq: Every day | ORAL | 1 refills | Status: DC

## 2018-11-25 MED ORDER — MAGNESIUM SULFATE IN D5W 1-5 GM/100ML-% IV SOLN
1.0000 g | Freq: Once | INTRAVENOUS | Status: AC
  Administered 2018-11-25: 1 g via INTRAVENOUS
  Filled 2018-11-25 (×2): qty 100

## 2018-11-25 MED ORDER — ADULT MULTIVITAMIN W/MINERALS CH: 1.0000 | ORAL_TABLET | Freq: Every day | ORAL | 1 refills | Status: DC

## 2018-11-25 MED ORDER — NICOTINE 21 MG/24HR TD PT24: 21.0000 mg | MEDICATED_PATCH | Freq: Every day | TRANSDERMAL | 0 refills | Status: DC

## 2018-11-25 MED ORDER — ASPIRIN 81 MG PO TBEC: 81.0000 mg | DELAYED_RELEASE_TABLET | Freq: Every day | ORAL | 3 refills | Status: DC

## 2018-11-25 MED ORDER — FOLIC ACID 1 MG PO TABS: 1.0000 mg | ORAL_TABLET | Freq: Every day | ORAL | 1 refills | Status: DC

## 2018-11-25 NOTE — Progress Notes (Signed)
PT Cancellation Note  Patient Details Name: KHAMANI FAIRLEY MRN: [id number] DOB: [birth year]   Cancelled Treatment:    Reason Eval/Treat Not Completed: (Consult received and chart reviewed.  Patient refusing participation with session at this time, reporting headache.  Meds requested per RN.  Will re-attempt at later time/date as medically appropriate and available.)   Turhan Chill H. Owens Shark, PT, DPT, NCS 11/25/18, 10:59 AM [phone]

## 2018-11-25 NOTE — Discharge Summary (Signed)
Physician Discharge Summary  Thomas Coffey MGQ:[id number] DOB: [birth year] DOA: 11/11/2018  PCP: Patient, [name]  Admit date: 11/11/2018 Discharge date: 11/25/2018  Recommendations for Outpatient Follow-up:  1. Follow up with PCP in 1-2 weeks 2. Check CBC and CMP, Mg level at visit  Home Health: none Equipment/Devices: none    Discharge Condition: stable  CODE STATUS: Fuall  Diet recommendation: regular  Brief/Interim Summary (HPI) Thomas Coffey  is a 63 y.o. male with history of tobacco abuse who presents to the hospital complaining of vague epigastric/right upper quadrant/chest pain.  Patient says he was in his usual state of health and developed worsening symptoms yesterday late evening which progressed today and he had associated vomiting twice today.  His vomitus was nonbloody and bilious in nature.  Patient was not feeling better and therefore came to the ER for further evaluation.  In the emergency room patient underwent blood work which showed acute kidney injury with abnormal LFTs and ultrasound findings suggestive of suspected cholelithiasis with cholecystitis but given his acute kidney injury and the fact that he was in A. fib with RVR surgery recommended admission by hospitalist services.  Patient was given 1 dose of IV Cardizem in the ER for his atrial fibrillation and he has now converted to normal sinus rhythm.  Given patient's elevated D-dimer and atypical symptoms he underwent a CTA of the chest abdomen pelvis which is not suggestive of a necrotizing left-sided pneumonia with empyema.  Patient does admit to some exertional dyspnea, no night sweats, chills but no documented fever.  Given his abnormal imaging findings and blood work hospitalist services were contacted for admission.  Discharge Diagnoses and Hospital Course:   Principal Problem:   Empyema (HCC) Active Problems:   Acute hepatitis   Atrial fibrillation with RVR (HCC)   Alcohol abuse   Tobacco  dependence syndrome   Atrial fibrillation with rapid ventricular response (HCC)   AKI (acute kidney injury) (HCC)   Protein-calorie malnutrition, severe   Pleural effusion on left   Hypomagnesemia  Left sided empyema and left pleural effusion: s/p CT-guided left chest tube placement 10/27. meeting sepsis criteria on admission with tachycardia, leukocytosis, and lactic acidosis, all of which are improving. Sepsis resolved. Body fluid cultures positive for RARE VIRIDANS STREPTOCOCCUS. Narrowed down from cefepime to rocephine 11/4. WBC trending up 17.7 -->19.5 -->21.5 -->20.9 -->13.5 -->9.7 at discharge. Consulted ID 11/5. Dr. Rivka Safer evaluated pt. Changed Abx to Unasyn for better anaerobic coverage, will pt complete total of 4 weeks of antibiotics. Blood cultures with no growth. Patient does not have fever or leukocytosis at the discharge.  Has minimal pain.  Chest tube with removed.  Per cardiothoracic surgeon, Dr. Thelma Barge patient is ready to be discharged. -Will discharge patient on Augmentin for another 17 days. -will let follow up with PCP in 1 week -will f/u with cardiothoracic surgeon in 10 days -Patient does not have PCP, I offered to find a PCP for him, but he said he wants to find PCP by himself.  I recommended the patient to find a PCP as soon as possible, hopefully in 1 week.  Need to see in 1 week.  Acute hepatitis:likelydue to ischemic hepatitis in the setting of sepsis vs alcoholic hepatitis. Improving. GI was consulted. RUQ ultrasound was suggestive of cholelithiasis and showed some possible gallbladder wall thickening, but doubt cholecystitis without any right upper quadrant pain-patient still without pain. MRCP performed 10/27 with no evidence of biliary tract obstruction. Acute hepatitis panel was negative -f/u  visit to check CMP for LFT  A. fib with RVR- patient had one episode on admission, likely precipitated byhispneumonia. He has been in NSR since then. CHADSVASc is a  0. -Noplans to place patient on long term anticoagulation. ECHO with EF 60-65%, mild LVH - continue ASA  Alcohol abuse- drinks 4 beers and 1/2 pint of liquor per day. StoppedCIWA 10/31, as patient has been asymptomatic for 5 days -Continue with thiamine, folate  Tobacco use -treated with Nicotine patch daily  Protein-calorie malnutrition, severe -treated with Ensurre  AKI: resolved. Cre 0.69 and BUN 6  Hypomagnesemia: Mg 1.7 -1 g of magnesium sulfate by IV was given     Discharge Instructions  Discharge Instructions    Call MD for:  difficulty breathing, headache or visual disturbances   Complete by: As directed    Call MD for:  persistant dizziness or light-headedness   Complete by: As directed    Call MD for:  severe uncontrolled pain   Complete by: As directed    Call MD for:  temperature >100.4   Complete by: As directed    Diet general   Complete by: As directed    Increase activity slowly   Complete by: As directed      Allergies as of 11/25/2018   No Known Allergies     Medication List    STOP taking these medications   acetaminophen-codeine 300-30 MG tablet Commonly known as: TYLENOL #3     TAKE these medications   acetaminophen 325 MG tablet Commonly known as: TYLENOL Take 2 tablets (650 mg total) by mouth every 6 (six) hours as needed for mild pain or fever.   amoxicillin-clavulanate 875-125 MG tablet Commonly known as: AUGMENTIN Take 1 tablet by mouth every 12 (twelve) hours.   aspirin 81 MG EC tablet Take 1 tablet (81 mg total) by mouth daily. Start taking on: November 26, 2018   folic acid 1 MG tablet Commonly known as: FOLVITE Take 1 tablet (1 mg total) by mouth daily. Start taking on: November 26, 2018   multivitamin with minerals Tabs tablet Take 1 tablet by mouth daily. Start taking on: November 26, 2018   nicotine 21 mg/24hr patch Commonly known as: NICODERM CQ - dosed in mg/24 hours Place 1 patch (21 mg total) onto the  skin daily. Start taking on: November 26, 2018   thiamine 100 MG tablet Take 1 tablet (100 mg total) by mouth daily. Start taking on: November 26, 2018      Follow-up Information    Hulda Marin, MD Follow up in 1 week(s).   Specialties: Cardiothoracic Surgery, General Surgery Contact information: [address] [place] [address] [phone]          No Known Allergies  Consultations:  Cardiothoracic surgeon   Procedures/Studies: Dg Chest 1 View  Result Date: 11/16/2018 CLINICAL DATA:  Follow-up for empyema.  Left-sided chest tube. EXAM: CHEST  1 VIEW COMPARISON:  11/15/2018 and earlier exams. FINDINGS: Pigtail chest tube projects over the left mid hemithorax, stable. There is hazy opacity from the left mid lung to the base, improved compared to the previous day's exam. Right lung is clear. No pneumothorax. IMPRESSION: 1. Improved lung aeration since the previous day's exam. Left lung opacities have decreased. No new lung abnormalities. No pneumothorax. Electronically Signed   By: Amie Portland M.D.   On: 11/16/2018 10:27   Dg Chest 1 View  Result Date: 11/11/2018 CLINICAL DATA:  Chest pain EXAM: CHEST  1  VIEW COMPARISON:  07/15/2005 FINDINGS: Defibrillator pads overlie the left lower chest. Increased left basilar ill-defined opacity obscures the left hemidiaphragm may represent atelectasis and/or consolidation. Difficult to exclude left lower lung pneumonia. Right lung remains clear. Normal heart size and vascularity. No large effusion or pneumothorax. Trachea midline. Aorta atherosclerotic. Degenerative changes of the spine. IMPRESSION: Increased left lower lung ill-defined opacity compatible with atelectasis or developing airspace disease. Electronically Signed   By: Judie Petit.  Shick M.D.   On: 11/11/2018 19:28   Dg Chest 2 View  Result Date: 11/22/2018 CLINICAL DATA:  Smoker, pleural effusion on LEFT, chest tube EXAM: CHEST - 2 VIEW COMPARISON:  11/20/2018  Correlation: CT chest 11/17/2018 FINDINGS: Pigtail LEFT thoracostomy tube. Normal heart size, mediastinal contours, and pulmonary vascularity. Atherosclerotic calcification aorta. Small residual LEFT pleural effusion. No acute infiltrate, RIGHT pleural effusion, or pneumothorax. Probable LEFT nipple shadow, no pulmonary nodule seen at this site on recent CT. 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 IMPRESSION: LEFT thoracostomy tube with minimal residual LEFT pleural effusion. Probable LEFT nipple shadow as above. Otherwise negative exam Electronically Signed   By: Ulyses Southward M.D.   On: 11/22/2018 13:35   Dg Chest 2 View  Result Date: 11/20/2018 CLINICAL DATA:  Left empyema. Left chest tube. EXAM: CHEST - 2 VIEW COMPARISON:  11/16/2018 and 11/13/2018 FINDINGS: Left chest tube remains in place. There has been a significant reduction in size of the left empyema since prior studies. There is some residual loculated fluid at the left lung base. The heart size and vascularity are normal. No infiltrates. No acute bone abnormality. IMPRESSION: Marked reduction in size of the left empyema with some residual loculated fluid at the left lung base. Left chest tube remains in place. Electronically Signed   By: Francene Boyers M.D.   On: 11/20/2018 09:46   Dg Chest 2 View  Result Date: 11/13/2018 CLINICAL DATA:  Status post left chest tube placement for drainage of empyema. EXAM: CHEST - 2 VIEW COMPARISON:  11/11/2018 FINDINGS: The heart size and mediastinal contours are within normal limits. Interval placement of left-sided pleural pigtail catheter with improved aeration of the left lung. No pneumothorax identified. Bibasilar atelectasis present. The visualized skeletal structures are unremarkable. IMPRESSION: No pneumothorax identified after placement of left-sided pleural pigtail catheter. Bibasilar atelectasis present with improved aeration of the left lung. Electronically Signed   By:  Irish Lack M.D.   On: 11/13/2018 08:59   Ct Chest Wo Contrast  Result Date: 11/17/2018 CLINICAL DATA:  Follow-up left empyema status post chest tube drainage. EXAM: CT CHEST WITHOUT CONTRAST TECHNIQUE: Multidetector CT imaging of the chest was performed following the standard protocol without IV contrast. COMPARISON:  11/11/2018 FINDINGS: Cardiovascular: No acute findings. Aortic and coronary artery atherosclerosis. Mediastinum/Nodes: No masses or pathologically enlarged lymph nodes identified on this unenhanced exam. Lungs/Pleura: There has been placement of a left pleural pigtail drainage catheter since prior study with near complete resolution of left pleural fluid collection since previous study. Mild left lower lobe atelectasis has significantly decreased since previous study. A small right pleural effusion and mild dependent atelectasis have increased since previous study. No evidence of pulmonary consolidation or mass. Mild emphysema again noted. Upper Abdomen:  Unremarkable. Musculoskeletal:  No suspicious bone lesions. IMPRESSION: Near complete resolution of left pleural fluid collection and decreased left lower lobe atelectasis following placement of left pleural drainage catheter. Increased small right pleural effusion and mild dependent right lower lobe atelectasis. Aortic Atherosclerosis (ICD10-I70.0) and Emphysema (ICD10-J43.9).  Coronary artery atherosclerosis. Electronically Signed   By: Marlaine Hind M.D.   On: 11/17/2018 14:03   Ct Angio Chest Pe W And/or Wo Contrast  Result Date: 11/11/2018 CLINICAL DATA:  Abdominal pain and complex chest pain. EXAM: CT ANGIOGRAPHY CHEST CT ABDOMEN AND PELVIS WITH CONTRAST TECHNIQUE: Multidetector CT imaging of the chest was performed using the standard protocol during bolus administration of intravenous contrast. Multiplanar CT image reconstructions and MIPs were obtained to evaluate the vascular anatomy. Multidetector CT imaging of the abdomen and  pelvis was performed using the standard protocol during bolus administration of intravenous contrast. CONTRAST:  65mL OMNIPAQUE IOHEXOL 350 MG/ML SOLN COMPARISON:  CT dated July 15, 2005 FINDINGS: CTA CHEST FINDINGS Cardiovascular: Contrast injection is sufficient to demonstrate satisfactory opacification of the pulmonary arteries to the segmental level. There is no pulmonary embolus. The main pulmonary artery is within normal limits for size. There is no CT evidence of acute right heart strain. The visualized aorta is normal. Heart size is normal, without pericardial effusion. Mediastinum/Nodes: --there is some mildly prominent mediastinal lymph nodes, likely reactive. --No axillary lymphadenopathy. --No supraclavicular lymphadenopathy. --Normal thyroid gland. --The esophagus is unremarkable Lungs/Pleura: There is a large complex left-sided empyema with multiple pockets of gas scattered throughout the collection both in a dependent and nondependent location. There is consolidation of the left lower lobe with findings raising concern for necrotizing pneumonia. Moderate emphysematous changes are noted bilaterally. There is a trace right-sided pleural effusion there is some mild interlobular septal thickening. There is some debris within the trachea. Musculoskeletal: No chest wall abnormality. No acute or significant osseous findings. Review of the MIP images confirms the above findings. CT ABDOMEN and PELVIS FINDINGS Hepatobiliary: There is decreased hepatic attenuation suggestive of hepatic steatosis. Normal gallbladder.There is no biliary ductal dilation. Pancreas: Normal contours without ductal dilatation. No peripancreatic fluid collection. Spleen: No splenic laceration or hematoma. Adrenals/Urinary Tract: --Adrenal glands: No adrenal hemorrhage. --Right kidney/ureter: No hydronephrosis or perinephric hematoma. --Left kidney/ureter: No hydronephrosis or perinephric hematoma. --Urinary bladder: Unremarkable.  Stomach/Bowel: --Stomach/Duodenum: There is moderate distention of the stomach with an air-fluid level --Small bowel: No dilatation or inflammation. --Colon: The patient is status post right hemicolectomy. --Appendix: Surgically absent. Vascular/Lymphatic: Normal course and caliber of the major abdominal vessels. --No retroperitoneal lymphadenopathy. --No mesenteric lymphadenopathy. --No pelvic or inguinal lymphadenopathy. Reproductive: Unremarkable Other: No ascites or free air. The abdominal wall is normal. Musculoskeletal. No acute displaced fractures. Review of the MIP images confirms the above findings. IMPRESSION: 1. Large complex left-sided empyema. 2. Consolidation of the left lower lobe with findings raising concern for necrotizing pneumonia. 3. Trace right-sided pleural effusion. 4. No acute intra-abdominal or pelvic process. 5. Hepatic steatosis. 6. Moderate distention of the stomach. 7. Status post right hemicolectomy. Aortic Atherosclerosis (ICD10-I70.0) and Emphysema (ICD10-J43.9). Electronically Signed   By: Constance Holster M.D.   On: 11/11/2018 21:37   Ct Abdomen Pelvis W Contrast  Result Date: 11/11/2018 CLINICAL DATA:  Abdominal pain and complex chest pain. EXAM: CT ANGIOGRAPHY CHEST CT ABDOMEN AND PELVIS WITH CONTRAST TECHNIQUE: Multidetector CT imaging of the chest was performed using the standard protocol during bolus administration of intravenous contrast. Multiplanar CT image reconstructions and MIPs were obtained to evaluate the vascular anatomy. Multidetector CT imaging of the abdomen and pelvis was performed using the standard protocol during bolus administration of intravenous contrast. CONTRAST:  67mL OMNIPAQUE IOHEXOL 350 MG/ML SOLN COMPARISON:  CT dated July 15, 2005 FINDINGS: CTA CHEST FINDINGS Cardiovascular: Contrast injection is sufficient to  demonstrate satisfactory opacification of the pulmonary arteries to the segmental level. There is no pulmonary embolus. The main  pulmonary artery is within normal limits for size. There is no CT evidence of acute right heart strain. The visualized aorta is normal. Heart size is normal, without pericardial effusion. Mediastinum/Nodes: --there is some mildly prominent mediastinal lymph nodes, likely reactive. --No axillary lymphadenopathy. --No supraclavicular lymphadenopathy. --Normal thyroid gland. --The esophagus is unremarkable Lungs/Pleura: There is a large complex left-sided empyema with multiple pockets of gas scattered throughout the collection both in a dependent and nondependent location. There is consolidation of the left lower lobe with findings raising concern for necrotizing pneumonia. Moderate emphysematous changes are noted bilaterally. There is a trace right-sided pleural effusion there is some mild interlobular septal thickening. There is some debris within the trachea. Musculoskeletal: No chest wall abnormality. No acute or significant osseous findings. Review of the MIP images confirms the above findings. CT ABDOMEN and PELVIS FINDINGS Hepatobiliary: There is decreased hepatic attenuation suggestive of hepatic steatosis. Normal gallbladder.There is no biliary ductal dilation. Pancreas: Normal contours without ductal dilatation. No peripancreatic fluid collection. Spleen: No splenic laceration or hematoma. Adrenals/Urinary Tract: --Adrenal glands: No adrenal hemorrhage. --Right kidney/ureter: No hydronephrosis or perinephric hematoma. --Left kidney/ureter: No hydronephrosis or perinephric hematoma. --Urinary bladder: Unremarkable. Stomach/Bowel: --Stomach/Duodenum: There is moderate distention of the stomach with an air-fluid level --Small bowel: No dilatation or inflammation. --Colon: The patient is status post right hemicolectomy. --Appendix: Surgically absent. Vascular/Lymphatic: Normal course and caliber of the major abdominal vessels. --No retroperitoneal lymphadenopathy. --No mesenteric lymphadenopathy. --No pelvic or  inguinal lymphadenopathy. Reproductive: Unremarkable Other: No ascites or free air. The abdominal wall is normal. Musculoskeletal. No acute displaced fractures. Review of the MIP images confirms the above findings. IMPRESSION: 1. Large complex left-sided empyema. 2. Consolidation of the left lower lobe with findings raising concern for necrotizing pneumonia. 3. Trace right-sided pleural effusion. 4. No acute intra-abdominal or pelvic process. 5. Hepatic steatosis. 6. Moderate distention of the stomach. 7. Status post right hemicolectomy. Aortic Atherosclerosis (ICD10-I70.0) and Emphysema (ICD10-J43.9). Electronically Signed   By: Katherine Mantle M.D.   On: 11/11/2018 21:37   US Renal  Result Date: 11/13/2018 CLINICAL DATA:  Acute kidney injury EXAM: RENAL / URINARY TRACT ULTRASOUND COMPLETE COMPARISON:  CT 11/11/2018 FINDINGS: Right Kidney: Renal measurements: 13.7 x 9.5 x 5.9 cm = volume: 231 mL. Mildly increased echotexture throughout the right kidney. No mass or hydronephrosis. Left Kidney: Renal measurements: 12.8 x 5.9 x 5.1 cm = volume: 200 mL. Echogenicity within normal limits. No mass or hydronephrosis visualized. Bladder: Appears normal for degree of bladder distention. Other: IMPRESSION: Increased echotexture in the kidneys bilaterally suggesting medical renal disease. No hydronephrosis. Electronically Signed   By: Charlett Nose M.D.   On: 11/13/2018 09:07   Mr Abdomen Mrcp Wo Contrast  Result Date: 11/12/2018 CLINICAL DATA:  63 year old male with history of abnormal liver function tests. Cholelithiasis. Right upper quadrant abdominal pain. EXAM: MRI ABDOMEN WITHOUT CONTRAST  (INCLUDING MRCP) TECHNIQUE: Multiplanar multisequence MR imaging of the abdomen was performed. Heavily T2-weighted images of the biliary and pancreatic ducts were obtained, and three-dimensional MRCP images were rendered by post processing. COMPARISON:  No prior abdominal MRI. CT the abdomen and pelvis 11/11/2018.  FINDINGS: Comment: Study is limited for detection and characterization of visceral and/or vascular lesions by lack of IV gadolinium. Lower chest: Trace right pleural effusion. Complex multilocular left pleural effusion with probable atelectasis in the left lower lobe. Hepatobiliary: Mild diffuse loss of signal  intensity throughout the hepatic parenchyma on out of phase dual echo images, indicative of mild hepatic steatosis. No discrete cystic or solid hepatic lesions are confidently identified on today's noncontrast examination. Gallbladder is normal in appearance. No intra or extrahepatic biliary ductal dilatation noted on MRCP images. Common bile duct is normal in caliber measuring 5 mm in the porta hepatis. No filling defects in the common bile duct to suggest choledocholithiasis. Pancreas: No definite pancreatic mass identified on today's noncontrast examination. No pancreatic ductal dilatation noted on MRCP images. No pancreatic or peripancreatic fluid collections or inflammatory changes. Spleen:  Unremarkable. Adrenals/Urinary Tract: Unenhanced appearance of the kidneys and bilateral adrenal glands is unremarkable. No hydroureteronephrosis in the visualized portions of the abdomen. Stomach/Bowel: Visualized portions are unremarkable. Vascular/Lymphatic: Aortic atherosclerosis without definite aneurysm in the abdomen on today's noncontrast examination. No lymphadenopathy noted in the abdomen. Other: No significant volume of ascites in the visualized portions of the peritoneal cavity. Trace amount of perinephric fluid bilaterally (nonspecific). Musculoskeletal: No aggressive appearing osseous lesions are noted in the visualized portions of the skeleton. IMPRESSION: 1. No findings to suggest biliary tract obstruction. No choledocholithiasis. 2. Mild hepatic steatosis. 3. Trace right pleural effusion and large complex multilocular left pleural effusion with probable atelectasis in the left lower lobe. 4. Aortic  atherosclerosis. Electronically Signed   By: Trudie Reed M.D.   On: 11/12/2018 10:26   Mr 3d Recon At Scanner  Result Date: 11/12/2018 CLINICAL DATA:  63 year old male with history of abnormal liver function tests. Cholelithiasis. Right upper quadrant abdominal pain. EXAM: MRI ABDOMEN WITHOUT CONTRAST  (INCLUDING MRCP) TECHNIQUE: Multiplanar multisequence MR imaging of the abdomen was performed. Heavily T2-weighted images of the biliary and pancreatic ducts were obtained, and three-dimensional MRCP images were rendered by post processing. COMPARISON:  No prior abdominal MRI. CT the abdomen and pelvis 11/11/2018. FINDINGS: Comment: Study is limited for detection and characterization of visceral and/or vascular lesions by lack of IV gadolinium. Lower chest: Trace right pleural effusion. Complex multilocular left pleural effusion with probable atelectasis in the left lower lobe. Hepatobiliary: Mild diffuse loss of signal intensity throughout the hepatic parenchyma on out of phase dual echo images, indicative of mild hepatic steatosis. No discrete cystic or solid hepatic lesions are confidently identified on today's noncontrast examination. Gallbladder is normal in appearance. No intra or extrahepatic biliary ductal dilatation noted on MRCP images. Common bile duct is normal in caliber measuring 5 mm in the porta hepatis. No filling defects in the common bile duct to suggest choledocholithiasis. Pancreas: No definite pancreatic mass identified on today's noncontrast examination. No pancreatic ductal dilatation noted on MRCP images. No pancreatic or peripancreatic fluid collections or inflammatory changes. Spleen:  Unremarkable. Adrenals/Urinary Tract: Unenhanced appearance of the kidneys and bilateral adrenal glands is unremarkable. No hydroureteronephrosis in the visualized portions of the abdomen. Stomach/Bowel: Visualized portions are unremarkable. Vascular/Lymphatic: Aortic atherosclerosis without definite  aneurysm in the abdomen on today's noncontrast examination. No lymphadenopathy noted in the abdomen. Other: No significant volume of ascites in the visualized portions of the peritoneal cavity. Trace amount of perinephric fluid bilaterally (nonspecific). Musculoskeletal: No aggressive appearing osseous lesions are noted in the visualized portions of the skeleton. IMPRESSION: 1. No findings to suggest biliary tract obstruction. No choledocholithiasis. 2. Mild hepatic steatosis. 3. Trace right pleural effusion and large complex multilocular left pleural effusion with probable atelectasis in the left lower lobe. 4. Aortic atherosclerosis. Electronically Signed   By: Trudie Reed M.D.   On: 11/12/2018 10:26   Dg  Chest Port 1 View  Result Date: 11/15/2018 CLINICAL DATA:  Chest tube in place EXAM: PORTABLE CHEST 1 VIEW COMPARISON:  11/13/2018 FINDINGS: Left-sided chest tube remains in position without significant pneumothorax. Slight interval increase in heterogeneous and interstitial opacity of the left lung. The right lung remains normally aerated. No focal airspace opacity. Heart and mediastinum are normal. IMPRESSION: Left-sided chest tube remains in position without significant pneumothorax. Slight interval increase in heterogeneous and interstitial opacity of the left lung. The right lung remains normally aerated. No focal airspace opacity Electronically Signed   By: Lauralyn Primes M.D.   On: 11/15/2018 08:42   Ct Image Guided Drainage By Percutaneous Catheter  Result Date: 11/12/2018 INDICATION: 63 year old with left chest empyema. EXAM: CT-GUIDED PLACEMENT OF LEFT CHEST TUBE MEDICATIONS: Moderate sedation ANESTHESIA/SEDATION: 2.0 mg IV Versed 100 mcg IV Fentanyl Moderate Sedation Time:  15 minutes The patient was continuously monitored during the procedure by the interventional radiology nurse under my direct supervision. COMPLICATIONS: None immediate. TECHNIQUE: Informed written consent was obtained  from the patient after a thorough discussion of the procedural risks, benefits and alternatives. All questions were addressed. A timeout was performed prior to the initiation of the procedure. PROCEDURE: Patient was placed on his right side. Images through the chest were obtained. Left side of the chest was prepped with chlorhexidine and sterile field was created. Skin and soft tissues were anesthetized with 1% lidocaine. Using CT guidance, a Yueh catheter was directed into the pleural space and cloudy yellow fluid was aspirated. Stiff Amplatz wire was advanced into the pleural space. The tract was dilated to accommodate a 14 [place] multipurpose drain. Additional cloudy yellow fluid was collected and sent for culture. Catheter was sutured to skin and attached to a PleurEvac collection device. Dressing was placed over the chest tube. FINDINGS: Complex left pleural effusion containing pockets of gas. Consolidation and volume loss in left lung. Cloudy yellow fluid was removed from the left pleural space. 14 French catheter positioned within the left pleural space. IMPRESSION: CT-guided placement of a left chest tube. Electronically Signed   By: Richarda Overlie M.D.   On: 11/12/2018 16:26   US Abdomen Limited Ruq  Result Date: 11/11/2018 CLINICAL DATA:  Pain EXAM: ULTRASOUND ABDOMEN LIMITED RIGHT UPPER QUADRANT COMPARISON:  None. FINDINGS: Gallbladder: Multiple gallstones are noted. There is gallbladder wall thickening with the gallbladder wall measuring approximately 4 mm. The sonographic Eulah Pont sign is negative. There is no pericholecystic free fluid. Common bile duct: Diameter: 4 mm Liver: No focal lesion identified. Within normal limits in parenchymal echogenicity. Portal vein is patent on color Doppler imaging with normal direction of blood flow towards the liver. Other: None. IMPRESSION: Cholelithiasis with mild gallbladder wall thickening. In the absence of a positive sonographic Murphy sign, these findings are  equivocal for acute cholecystitis. If there is high clinical suspicion for acute cholecystitis, follow-up with HIDA scan is recommended. Electronically Signed   By: Katherine Mantle M.D.   On: 11/11/2018 20:28     Subjective: Patient feels good,  He is happy that his chest tubes removed.  He has minimal pain from the chest tube insertion site.  No shortness of breath, cough, fever or chills.  No GI symptoms.  Discharge Exam: Vitals:   11/24/18 1947 11/25/18 0355  BP: 128/75 127/83  Pulse: 99 88  Resp: 20 20  Temp: 99.2 F (37.3 C) 98.3 F (36.8 C)  SpO2: 98% 97%   Vitals:   11/24/18 0727 11/24/18 1509 11/24/18 1947 11/25/18  0355  BP: 116/76 135/86 128/75 127/83  Pulse: 96 96 99 88  Resp: 19 19 20 20   Temp: 98.5 F (36.9 C) 98.3 F (36.8 C) 99.2 F (37.3 C) 98.3 F (36.8 C)  TempSrc: Oral Oral Oral Oral  SpO2: 99% 98% 98% 97%  Weight:      Height:        General: Pt is alert, awake, not in acute distress Cardiovascular: RRR, S1/S2 +, no rubs, no gallops Respiratory: CTA bilaterally, no wheezing, no rhonchi Abdominal: Soft, NT, ND, bowel sounds + Extremities: no edema, no cyanosis    The results of significant diagnostics from this hospitalization (including imaging, microbiology, ancillary and laboratory) are listed below for reference.     Microbiology: Recent Results (from the past 240 hour(s))  CULTURE, BLOOD (ROUTINE X 2) w Reflex to ID Panel     Status: None   Collection Time: 11/16/18  5:12 PM   Specimen: BLOOD  Result Value Ref Range Status   Specimen Description BLOOD BLOOD RIGHT WRIST  Final   Special Requests   Final    BOTTLES DRAWN AEROBIC AND ANAEROBIC Blood Culture adequate volume   Culture   Final    NO GROWTH 5 DAYS Performed at [facility], [address] [place], [place] [address]    Report Status 11/21/2018 FINAL  Final  CULTURE, BLOOD (ROUTINE X 2) w Reflex to ID Panel     Status: None   Collection Time: 11/16/18  5:27 PM    Specimen: BLOOD  Result Value Ref Range Status   Specimen Description BLOOD BLOOD LEFT WRIST  Final   Special Requests   Final    BOTTLES DRAWN AEROBIC AND ANAEROBIC Blood Culture results may not be optimal due to an excessive volume of blood received in culture bottles   Culture   Final    NO GROWTH 5 DAYS Performed at [facility], [address] [place], [place] [address]    Report Status 11/21/2018 FINAL  Final     Labs: BNP (last 3 results) No results for input(s): BNP in the last 8760 hours. Basic Metabolic Panel: Recent Labs  Lab 11/21/18 0442 11/22/18 0620 11/23/18 0454 11/24/18 0550 11/25/18 0517  NA 138 138 139 140 136  K 3.6 3.4* 3.6 4.1 4.3  CL 108 108 107 107 104  CO2 22 21* 23 23 24   GLUCOSE 99 98 100* 118* 90  BUN 5* <5* 5* 6* 6*  CREATININE 0.80 0.74 0.79 0.79 0.69  CALCIUM 8.6* 8.4* 8.8* 9.2 9.0  MG  --   --   --  1.6* 1.7   Liver Function Tests: Recent Labs  Lab 11/21/18 0442 11/22/18 0620  AST 63* 43*  ALT 77* 58*  ALKPHOS 116 103  BILITOT 0.6 0.8  PROT 5.7* 5.8*  ALBUMIN 2.0* 2.1*   No results for input(s): LIPASE, AMYLASE in the last 168 hours. No results for input(s): AMMONIA in the last 168 hours. CBC: Recent Labs  Lab 11/19/18 0918 11/20/18 0620 11/21/18 0442 11/21/18 1504 11/23/18 0454 11/25/18 0517  WBC 17.7* 19.5* 21.5* 20.9* 13.5* 9.7  NEUTROABS 14.9* 15.7* 18.0* 17.4*  --   --   HGB 11.3* 9.7* 9.7* 10.0* 9.6* 10.2*  HCT 34.5* 29.5* 29.2* 30.8* 28.3* 30.9*  MCV 94.5 92.8 91.0 92.5 89.3 90.1  PLT 755* 696* 721* 711* 720* 654*   Cardiac Enzymes: No results for input(s): CKTOTAL, CKMB, CKMBINDEX, TROPONINI in the last 168 hours. BNP: Invalid input(s): POCBNP  CBG: No results for input(s): GLUCAP in the last 168 hours. D-Dimer No results for input(s): DDIMER in the last 72 hours. Hgb A1c No results for input(s): HGBA1C in the last 72 hours. Lipid Profile No results for input(s): CHOL, HDL, LDLCALC, TRIG,  CHOLHDL, LDLDIRECT in the last 72 hours. Thyroid function studies No results for input(s): TSH, T4TOTAL, T3FREE, THYROIDAB in the last 72 hours.  Invalid input(s): FREET3 Anemia work up No results for input(s): VITAMINB12, FOLATE, FERRITIN, TIBC, IRON, RETICCTPCT in the last 72 hours. Urinalysis No results found for: COLORURINE, APPEARANCEUR, LABSPEC, PHURINE, GLUCOSEU, HGBUR, BILIRUBINUR, KETONESUR, PROTEINUR, UROBILINOGEN, NITRITE, LEUKOCYTESUR Sepsis Labs Invalid input(s): PROCALCITONIN,  WBC,  LACTICIDVEN Microbiology Recent Results (from the past 240 hour(s))  CULTURE, BLOOD (ROUTINE X 2) w Reflex to ID Panel     Status: None   Collection Time: 11/16/18  5:12 PM   Specimen: BLOOD  Result Value Ref Range Status   Specimen Description BLOOD BLOOD RIGHT WRIST  Final   Special Requests   Final    BOTTLES DRAWN AEROBIC AND ANAEROBIC Blood Culture adequate volume   Culture   Final    NO GROWTH 5 DAYS Performed at [facility], [address] [place], [place] [address]    Report Status 11/21/2018 FINAL  Final  CULTURE, BLOOD (ROUTINE X 2) w Reflex to ID Panel     Status: None   Collection Time: 11/16/18  5:27 PM   Specimen: BLOOD  Result Value Ref Range Status   Specimen Description BLOOD BLOOD LEFT WRIST  Final   Special Requests   Final    BOTTLES DRAWN AEROBIC AND ANAEROBIC Blood Culture results may not be optimal due to an excessive volume of blood received in culture bottles   Culture   Final    NO GROWTH 5 DAYS Performed at [facility], [address] [place], [place] [address]    Report Status 11/21/2018 FINAL  Final    Time coordinating discharge:  35  minutes  SIGNED:  Lorretta HarpXilin Emari Hreha, DO [facility] 11/25/2018, 2:53 PM Pager is on AMION  If 7PM-7AM, please contact night-coverage [email or web address] Password [email or web address]

## 2018-11-25 NOTE — Progress Notes (Signed)
  Patient ID: Thomas Coffey, male   DOB: [birth year], 63 y.o.   MRN: [id number]  HISTORY: He states he is feeling much better today.  Not short of breath.  No pain.  He has remained afebrile and his WBC is normal   Vitals:   11/24/18 1947 11/25/18 0355  BP: 128/75 127/83  Pulse: 99 88  Resp: 20 20  Temp: 99.2 F (37.3 C) 98.3 F (36.8 C)  SpO2: 98% 97%     EXAM:    Resp: Lungs are clear bilaterally.  No respiratory distress, normal effort. Heart:  Regular without murmurs Abd:  Abdomen is soft, non distended and non tender. No masses are palpable.  There is no rebound and no guarding.  Neurological: Alert and oriented to person, place, and time. Coordination normal.  Skin: Skin is warm and dry. No rash noted. No diaphoretic. No erythema. No pallor.  Psychiatric: Normal mood and affect. Normal behavior. Judgment and thought content normal.   No air leak seen   ASSESSMENT: Independent review of CXRay shows possible small left pleural effusion.     PLAN:   I believe it safe to remove his chest tube and I have done that.  He is OK to be discharged today if all other consultants agree.      Nestor Lewandowsky, MD

## 2018-11-25 NOTE — Discharge Instructions (Signed)
You were cared for by a hospitalist during your hospital stay. If you have any questions about your discharge medications or the care you received while you were in the hospital after you are discharged, you can call the unit and ask to speak with the hospitalist on call if the hospitalist that took care of you is not available. Once you are discharged, your primary care physician will handle any further medical issues. Please note that NO REFILLS for any discharge medications will be authorized once you are discharged, as it is imperative that you return to your primary care physician (or establish a relationship with a primary care physician if you do not have one) for your aftercare needs so that they can reassess your need for medications and monitor your lab values.  Follow up with cardiothoracic surgery in 1 week. Take all medications as prescribed. If symptoms change or worsen please return to the ED for evaluation.  As you told me that you will find a primary doctor by yourself, please do this as soon as possible, hopefully in 1 week .You need to see primary care doctor in 1 week.

## 2018-11-25 NOTE — Progress Notes (Signed)
OT Cancellation Note  Patient Details Name: SHAHRUKH PASCH MRN: [id number] DOB: 23-Jun-1955   Cancelled Treatment:    Reason Eval/Treat Not Completed: Patient declined, no reason specified Spoke to PT who just left patient's room and patient is refusing participation with session at this time, reporting headache.  Meds requested per RN.  Will re-attempt at later time/date as medically appropriate and available.  Thank you for the referral.  Chrys Racer, OTR/L, [facility] ascom [phone] 11/25/18, 11:06 AM

## 2018-11-29 ENCOUNTER — Ambulatory Visit: Payer: Medicaid Other | Admitting: Cardiothoracic Surgery

## 2018-12-05 ENCOUNTER — Other Ambulatory Visit: Payer: Self-pay

## 2018-12-05 DIAGNOSIS — J869 Pyothorax without fistula: Secondary | ICD-10-CM

## 2018-12-06 ENCOUNTER — Encounter: Payer: Self-pay | Admitting: Cardiothoracic Surgery

## 2018-12-06 ENCOUNTER — Ambulatory Visit
Admission: RE | Admit: 2018-12-06 | Discharge: 2018-12-06 | Disposition: A | Discharge status: Home / Self Care | Payer: Medicaid Other | Source: Ambulatory Visit | Attending: Cardiothoracic Surgery | Admitting: Cardiothoracic Surgery

## 2018-12-06 ENCOUNTER — Ambulatory Visit (INDEPENDENT_AMBULATORY_CARE_PROVIDER_SITE_OTHER): Payer: Medicaid Other | Admitting: Cardiothoracic Surgery

## 2018-12-06 ENCOUNTER — Other Ambulatory Visit: Payer: Self-pay

## 2018-12-06 ENCOUNTER — Other Ambulatory Visit: Payer: Self-pay | Admitting: Cardiothoracic Surgery

## 2018-12-06 ENCOUNTER — Telehealth: Payer: Self-pay | Admitting: *Deleted

## 2018-12-06 VITALS — BP 158/95 | HR 109 | Temp 97.5°F | Resp 14 | Ht 69.0 in | Wt 150.0 lb

## 2018-12-06 DIAGNOSIS — J869 Pyothorax without fistula: Secondary | ICD-10-CM

## 2018-12-06 NOTE — Telephone Encounter (Signed)
Called patient to let him know about his referral to [facility]. He will be seeing Dr.Bender on 12/18/18 at 1pm virtually. Patient is aware.

## 2018-12-06 NOTE — Progress Notes (Signed)
  Patient ID: Thomas Coffey, male   DOB: Dec 13, 1955, 63 y.o.   MRN: [id number]  HISTORY: He returns today in follow-up.  While in the hospital he was treated for a left empyema.  He states he has had no fevers or chills.  He gets occasionally short of breath whenever he exercises.  He is down to smoking just a few cigarettes a day.  He does not have a cough.   Vitals:   12/06/18 1012  BP: (!) 158/95  Pulse: (!) 109  Resp: 14  Temp: (!) 97.5 F (36.4 C)  SpO2: 99%     EXAM:    Resp: Lungs are clear bilaterally.  No respiratory distress, normal effort. Heart:  Regular without murmurs Abd:  Abdomen is soft, non distended and non tender. No masses are palpable.  There is no rebound and no guarding.  Neurological: Alert and oriented to person, place, and time. Coordination normal.  Skin: Skin is warm and dry. No rash noted. No diaphoretic. No erythema. No pallor.  Psychiatric: Normal mood and affect. Normal behavior. Judgment and thought content normal.    ASSESSMENT: I have independently reviewed his chest x-ray.  I see no evidence of pleural effusion or pneumothorax.  It appears essentially that his pneumonia is now resolved.   PLAN:   I did encourage him to obtain a primary care physician.  Will make a referral to the [facility] clinic for him to establish care there.  I did not make a return appointment for him but would be happy to see him should the need arise.    Nestor Lewandowsky, MD

## 2018-12-20 ENCOUNTER — Other Ambulatory Visit: Payer: Self-pay

## 2018-12-20 ENCOUNTER — Ambulatory Visit
Admission: RE | Admit: 2018-12-20 | Discharge: 2018-12-20 | Disposition: A | Discharge status: Home / Self Care | Payer: Medicaid Other | Source: Ambulatory Visit | Attending: Cardiothoracic Surgery | Admitting: Cardiothoracic Surgery

## 2018-12-20 ENCOUNTER — Ambulatory Visit (INDEPENDENT_AMBULATORY_CARE_PROVIDER_SITE_OTHER): Payer: Medicaid Other | Admitting: Cardiothoracic Surgery

## 2018-12-20 ENCOUNTER — Encounter: Payer: Self-pay | Admitting: Cardiothoracic Surgery

## 2018-12-20 ENCOUNTER — Ambulatory Visit
Admission: RE | Admit: 2018-12-20 | Discharge: 2018-12-20 | Disposition: A | Discharge status: Home / Self Care | Payer: Medicaid Other | Attending: Cardiothoracic Surgery | Admitting: Cardiothoracic Surgery

## 2018-12-20 VITALS — BP 130/82 | HR 102 | Temp 97.7°F | Resp 14 | Ht 68.0 in | Wt 155.0 lb

## 2018-12-20 DIAGNOSIS — J869 Pyothorax without fistula: Secondary | ICD-10-CM | POA: Diagnosis not present

## 2018-12-20 NOTE — Patient Instructions (Signed)
Appointment with Dr.Binder is scheduled 01/06/2019 @ [facility] @ 9:20 am.  [place] [address]

## 2018-12-20 NOTE — Progress Notes (Signed)
  Patient ID: Thomas Coffey, male   DOB: 1955-12-30, 63 y.o.   MRN: [id number]  HISTORY: He returns today in follow-up.  He states that his breathing continues to improve.  He is not short of breath unless he walks or exerts himself.  He is interested in obtaining a primary care physician but was unable to do so at this time. He thinks the Augmentin is causing him a headache but he is not sure that is the correct medication   Vitals:   12/20/18 0835  BP: 130/82  Pulse: (!) 102  Resp: 14  Temp: 97.7 F (36.5 C)  SpO2: 99%     EXAM:    Resp: Lungs are clear bilaterally.  No respiratory distress, normal effort. Heart:  Regular without murmurs Abd:  Abdomen is soft, non distended and non tender. No masses are palpable.  There is no rebound and no guarding.  Neurological: Alert and oriented to person, place, and time. Coordination normal.  Skin: Skin is warm and dry. No rash noted. No diaphoretic. No erythema. No pallor.  Psychiatric: Normal mood and affect. Normal behavior. Judgment and thought content normal.    ASSESSMENT: Independent review of his CXRay shows no obvious empyema   PLAN:   We did not make an appointment with him today but did refer him again to the [facility].  He should remain on all of his medications until he establishes a primary care physician/APP.    Nestor Lewandowsky, MD

## 2019-05-13 ENCOUNTER — Other Ambulatory Visit: Payer: Self-pay

## 2019-05-13 ENCOUNTER — Emergency Department
Admission: EM | Admit: 2019-05-13 | Discharge: 2019-05-13 | Disposition: A | Discharge status: Home / Self Care | Payer: Medicaid Other | Attending: Emergency Medicine | Admitting: Emergency Medicine

## 2019-05-13 ENCOUNTER — Emergency Department: Payer: Medicaid Other

## 2019-05-13 DIAGNOSIS — Z7982 Long term (current) use of aspirin: Secondary | ICD-10-CM | POA: Diagnosis not present

## 2019-05-13 DIAGNOSIS — K852 Alcohol induced acute pancreatitis without necrosis or infection: Secondary | ICD-10-CM | POA: Diagnosis not present

## 2019-05-13 DIAGNOSIS — R0789 Other chest pain: Secondary | ICD-10-CM | POA: Diagnosis present

## 2019-05-13 DIAGNOSIS — Z79899 Other long term (current) drug therapy: Secondary | ICD-10-CM | POA: Diagnosis not present

## 2019-05-13 DIAGNOSIS — F17210 Nicotine dependence, cigarettes, uncomplicated: Secondary | ICD-10-CM | POA: Insufficient documentation

## 2019-05-13 HISTORY — DX: Acute myocardial infarction, unspecified: I21.9

## 2019-05-13 LAB — CBC
HCT: 38.2 % — ABNORMAL LOW (ref 39.0–52.0)
Hemoglobin: 13.2 g/dL (ref 13.0–17.0)
MCH: 32.8 pg (ref 26.0–34.0)
MCHC: 34.6 g/dL (ref 30.0–36.0)
MCV: 94.8 fL (ref 80.0–100.0)
Platelets: 161 10*3/uL (ref 150–400)
RBC: 4.03 MIL/uL — ABNORMAL LOW (ref 4.22–5.81)
RDW: 15.5 % (ref 11.5–15.5)
WBC: 9.4 10*3/uL (ref 4.0–10.5)
nRBC: 0 % (ref 0.0–0.2)

## 2019-05-13 LAB — BASIC METABOLIC PANEL
Anion gap: 12 (ref 5–15)
BUN: 7 mg/dL — ABNORMAL LOW (ref 8–23)
CO2: 21 mmol/L — ABNORMAL LOW (ref 22–32)
Calcium: 8.6 mg/dL — ABNORMAL LOW (ref 8.9–10.3)
Chloride: 105 mmol/L (ref 98–111)
Creatinine, Ser: 0.91 mg/dL (ref 0.61–1.24)
GFR calc Af Amer: >60 mL/min (ref >60)
GFR calc non Af Amer: >60 mL/min (ref >60)
Glucose, Bld: 77 mg/dL (ref 70–99)
Potassium: 3.3 mmol/L — ABNORMAL LOW (ref 3.5–5.1)
Sodium: 138 mmol/L (ref 135–145)

## 2019-05-13 LAB — LIPASE, BLOOD: Lipase: 972 U/L — ABNORMAL HIGH (ref 11–51)

## 2019-05-13 LAB — HEPATIC FUNCTION PANEL
ALT: 54 U/L — ABNORMAL HIGH (ref 0–44)
AST: 181 U/L — ABNORMAL HIGH (ref 15–41)
Albumin: 3.4 g/dL — ABNORMAL LOW (ref 3.5–5.0)
Alkaline Phosphatase: 174 U/L — ABNORMAL HIGH (ref 38–126)
Bilirubin, Direct: 0.5 mg/dL — ABNORMAL HIGH (ref 0.0–0.2)
Indirect Bilirubin: 1 mg/dL — ABNORMAL HIGH (ref 0.3–0.9)
Total Bilirubin: 1.5 mg/dL — ABNORMAL HIGH (ref 0.3–1.2)
Total Protein: 7.1 g/dL (ref 6.5–8.1)

## 2019-05-13 LAB — TROPONIN I (HIGH SENSITIVITY)
Troponin I (High Sensitivity): 5 ng/L (ref <18)
Troponin I (High Sensitivity): 4 ng/L (ref <18)

## 2019-05-13 LAB — MAGNESIUM: Magnesium: 1.5 mg/dL — ABNORMAL LOW (ref 1.7–2.4)

## 2019-05-13 MED ORDER — ONDANSETRON HCL 4 MG/2ML IJ SOLN
INTRAMUSCULAR | Status: AC
  Filled 2019-05-13: qty 2

## 2019-05-13 MED ORDER — OXYCODONE-ACETAMINOPHEN 5-325 MG PO TABS: 1.0000 | ORAL_TABLET | ORAL | 0 refills | Status: DC | PRN

## 2019-05-13 MED ORDER — ALUM & MAG HYDROXIDE-SIMETH 200-200-20 MG/5ML PO SUSP
30.0000 mL | Freq: Once | ORAL | Status: AC
  Administered 2019-05-13: 14:00:00 30 mL via ORAL
  Filled 2019-05-13: qty 30

## 2019-05-13 MED ORDER — MORPHINE SULFATE (PF) 4 MG/ML IV SOLN
INTRAVENOUS | Status: AC
  Filled 2019-05-13: qty 1

## 2019-05-13 MED ORDER — LIDOCAINE VISCOUS HCL 2 % MT SOLN
15.0000 mL | Freq: Once | OROMUCOSAL | Status: AC
  Administered 2019-05-13: 14:00:00 15 mL via ORAL
  Filled 2019-05-13: qty 15

## 2019-05-13 MED ORDER — ONDANSETRON HCL 4 MG/2ML IJ SOLN
4.0000 mg | Freq: Once | INTRAMUSCULAR | Status: AC
  Administered 2019-05-13: 11:00:00 4 mg via INTRAVENOUS
  Filled 2019-05-13: qty 2

## 2019-05-13 MED ORDER — MORPHINE SULFATE (PF) 4 MG/ML IV SOLN
4.0000 mg | Freq: Once | INTRAVENOUS | Status: AC
  Administered 2019-05-13: 4 mg via INTRAVENOUS

## 2019-05-13 MED ORDER — MORPHINE SULFATE (PF) 4 MG/ML IV SOLN
4.0000 mg | Freq: Once | INTRAVENOUS | Status: AC
  Administered 2019-05-13: 11:00:00 4 mg via INTRAVENOUS
  Filled 2019-05-13: qty 1

## 2019-05-13 MED ORDER — ONDANSETRON HCL 4 MG/2ML IJ SOLN
4.0000 mg | Freq: Once | INTRAMUSCULAR | Status: AC
  Administered 2019-05-13: 14:00:00 4 mg via INTRAVENOUS

## 2019-05-13 MED ORDER — ONDANSETRON HCL 4 MG PO TABS: 4.0000 mg | ORAL_TABLET | Freq: Every day | ORAL | 0 refills | Status: DC | PRN

## 2019-05-13 NOTE — ED Notes (Signed)
Pt verifies that he has a ride to take him home prior to pain medication given. States he will not drive.

## 2019-05-13 NOTE — ED Provider Notes (Signed)
[facility] Emergency Department Provider Note  Time seen: 11:18 AM  I have reviewed the triage vital signs and the nursing notes.   HISTORY  Chief Complaint Chest Pain   HPI Thomas Coffey is a 64 y.o. male with a past medical history of a myocardial infarction in November, atrial fibrillation, alcohol abuse, presents to the emergency department for chest pain.  According to the patient since last night he has been experiencing a chest pain but also states some body aches fairly diffusely.  Denies any shortness of breath.   Denies any nausea or diaphoresis.  States moderate discomfort currently which he states is in the center of his chest and in the suprapubic region.  Denies any hematuria or dysuria.  No fever.  Has not had Covid vaccinations.  Past Medical History:  Diagnosis Date  . MI (myocardial infarction) [facility])     Patient Active Problem List   Diagnosis Date Noted  . Hypomagnesemia 11/24/2018  . Protein-calorie malnutrition, severe 11/23/2018  . Pleural effusion on left   . Liver function test abnormality   . AKI (acute kidney injury) ([place])   . Atrial fibrillation with rapid ventricular response ([place])   . Acute hepatitis 11/17/2018  . Atrial fibrillation with RVR ([place]) 11/17/2018  . Alcohol abuse 11/17/2018  . Tobacco dependence syndrome 11/17/2018  . Empyema [facility])     History reviewed. No pertinent surgical history.  Prior to Admission medications   Medication Sig Start Date End Date Taking? Authorizing Provider  acetaminophen (TYLENOL) 325 MG tablet Take 2 tablets (650 mg total) by mouth every 6 (six) hours as needed for mild pain or fever. 11/25/18   Ivor Costa, MD  amoxicillin-clavulanate (AUGMENTIN) 875-125 MG tablet Take 1 tablet by mouth every 12 (twelve) hours. 11/25/18   Ivor Costa, MD  aspirin EC 81 MG EC tablet Take 1 tablet (81 mg total) by mouth daily. 11/26/18   Ivor Costa, MD  folic acid (FOLVITE) 1 MG tablet Take 1 tablet (1  mg total) by mouth daily. 11/26/18   Ivor Costa, MD  Multiple Vitamin (MULTIVITAMIN WITH MINERALS) TABS tablet Take 1 tablet by mouth daily. 11/26/18   Ivor Costa, MD  nicotine (NICODERM CQ - DOSED IN MG/24 HOURS) 21 mg/24hr patch Place 1 patch (21 mg total) onto the skin daily. 11/26/18   Ivor Costa, MD  thiamine 100 MG tablet Take 1 tablet (100 mg total) by mouth daily. 11/26/18   Ivor Costa, MD    No Known Allergies  Family History  Problem Relation Age of Onset  . Cancer Mother   . Kidney disease Brother     Social History Social History   Tobacco Use  . Smoking status: Current Every Day Smoker    Packs/day: 0.50    Years: 30.00    Pack years: 15.00    Types: Cigarettes  . Smokeless tobacco: Never Used  Substance Use Topics  . Alcohol use: Yes    Comment: daily  . Drug use: Never    Review of Systems Constitutional: Negative for fever Cardiovascular: Positive for mild to moderate central chest pain. Respiratory: Negative for shortness of breath. Gastrointestinal: Negative for abdominal pain, vomiting  Musculoskeletal: Negative for musculoskeletal complaints Neurological: Negative for headache All other ROS negative  ____________________________________________   PHYSICAL EXAM:  VITAL SIGNS: ED Triage Vitals  Enc Vitals Group     BP 05/13/19 1027 (!) 156/91     Pulse Rate 05/13/19 1027 95     Resp  05/13/19 1027 (!) 21     Temp 05/13/19 1027 98.2 F (36.8 C)     Temp Source 05/13/19 1027 Oral     SpO2 05/13/19 1027 98 %     Weight 05/13/19 1028 170 lb (77.1 kg)     Height 05/13/19 1028 5\' 8"  (1.727 m)     Head Circumference --      Peak Flow --      Pain Score 05/13/19 1024 6     Pain Loc --      Pain Edu? --      Excl. in GC? --     Constitutional: Alert and oriented. Well appearing and in no distress. Eyes: Normal exam ENT      Head: Normocephalic and atraumatic.      Mouth/Throat: Mucous membranes are moist. Cardiovascular: Normal rate,  regular rhythm. No murmur Respiratory: Normal respiratory effort without tachypnea nor retractions. Breath sounds are clear.  Mild chest wall tenderness to palpation. Gastrointestinal: Soft, mild suprapubic tenderness palpation without rebound guarding or distention. Musculoskeletal: Nontender with normal range of motion in all extremities. No lower extremity tenderness or edema. Neurologic:  Normal speech and language. No gross focal neurologic deficits  Skin:  Skin is warm, dry and intact.  Psychiatric: Mood and affect are normal.   ____________________________________________    EKG  EKG viewed and interpreted by myself shows a sinus tachycardia 102 bpm with a narrow QRS, normal axis, normal intervals, no concerning ST changes.  ____________________________________________    RADIOLOGY  Chest x-ray negative  ____________________________________________   INITIAL IMPRESSION / ASSESSMENT AND PLAN / ED COURSE  Pertinent labs & imaging results that were available during my care of the patient were reviewed by me and considered in my medical decision making (see chart for details).   Patient presents to the emergency department for chest pain.  Patient states pain in his abdomen and chest and somewhat over his body.  Denies any fever cough or shortness of breath.  States mild to moderate discomfort currently.  Patient's initial work-up including basic labs and a troponin are negative.  Chest x-ray is normal.  EKG is reassuring.  I have added on abdominal labs as well as a magnesium levels the patient has a history of hypomagnesemia.  We will treat pain and continue to closely monitor.  Patient agreeable to plan of care.  Patient continues to have abdominal pain.  His lipase is finally resulted at 972.  Highly suspect alcohol induced pancreatitis.  Patient strongly wishes to go home.  States he has had pancreatitis before but it has been several years.  I discussed with the patient  admission to the hospital, however he wishes to go home but states he will come back if the pain worsens.  I discussed home care with the patient including clear liquid diet avoiding alcohol and taking pain medication as needed.  Patient is to return for any worsening abdominal pain.    05/15/19 was evaluated in Emergency Department on 05/13/2019 for the symptoms described in the history of present illness. He was evaluated in the context of the global COVID-19 pandemic, which necessitated consideration that the patient might be at risk for infection with the SARS-CoV-2 virus that causes COVID-19. Institutional protocols and algorithms that pertain to the evaluation of patients at risk for COVID-19 are in a state of rapid change based on information released by regulatory bodies including the CDC and federal and state organizations. These policies and algorithms were followed  during the patient's care in the ED.  ____________________________________________   FINAL CLINICAL IMPRESSION(S) / ED DIAGNOSES  Chest pain Alcohol induced pancreatitis   Minna Antis, MD 05/13/19 1410

## 2019-05-13 NOTE — ED Notes (Addendum)
Pt alert and oriented X 4, stable for discharge. RR even and unlabored, color WNL. Discussed discharge instructions and follow up when appropriate. Instructed to follow up with ER for any life threatening symptoms or concerns that patient or family of patient may have  Wheeled to lobby, awaiting son. Pt contracted that he was not driving and would not drive due to receiving morphine.

## 2019-05-13 NOTE — Discharge Instructions (Addendum)
As we discussed please take your pain medication as needed as prescribed.  Please drink plenty of fluids and avoid all alcohol.  Please adhere to a very bland diet avoiding all fatty/oily foods.  Please see discharge paperwork for an eating plan.  Return to the emergency department for any worsening pain, or any other symptom personally concerning to yourself.

## 2019-05-13 NOTE — ED Notes (Signed)
Pt requesting pain medication. EDP informed

## 2019-05-13 NOTE — ED Triage Notes (Signed)
Patient presents to the ED with chest pain that began this morning.  Patient states pain is in the upper center of his chest and some in his abdomen.  Patient states he has had a heart attack in the past.

## 2019-07-19 ENCOUNTER — Other Ambulatory Visit: Payer: Self-pay

## 2019-07-19 ENCOUNTER — Emergency Department: Payer: Medicaid Other

## 2019-07-19 ENCOUNTER — Observation Stay
Admission: EM | Admit: 2019-07-19 | Discharge: 2019-07-20 | DRG: 440 | Discharge status: Against Medical Advice | Payer: Medicaid Other | Attending: Family Medicine | Admitting: Family Medicine

## 2019-07-19 DIAGNOSIS — Z716 Tobacco abuse counseling: Secondary | ICD-10-CM | POA: Diagnosis not present

## 2019-07-19 DIAGNOSIS — E876 Hypokalemia: Secondary | ICD-10-CM | POA: Diagnosis not present

## 2019-07-19 DIAGNOSIS — Z7982 Long term (current) use of aspirin: Secondary | ICD-10-CM | POA: Diagnosis not present

## 2019-07-19 DIAGNOSIS — F1290 Cannabis use, unspecified, uncomplicated: Secondary | ICD-10-CM | POA: Diagnosis not present

## 2019-07-19 DIAGNOSIS — Z79899 Other long term (current) drug therapy: Secondary | ICD-10-CM | POA: Diagnosis not present

## 2019-07-19 DIAGNOSIS — Z7289 Other problems related to lifestyle: Secondary | ICD-10-CM | POA: Diagnosis not present

## 2019-07-19 DIAGNOSIS — Z20822 Contact with and (suspected) exposure to covid-19: Secondary | ICD-10-CM | POA: Diagnosis present

## 2019-07-19 DIAGNOSIS — F17210 Nicotine dependence, cigarettes, uncomplicated: Secondary | ICD-10-CM | POA: Diagnosis not present

## 2019-07-19 DIAGNOSIS — K529 Noninfective gastroenteritis and colitis, unspecified: Secondary | ICD-10-CM | POA: Diagnosis present

## 2019-07-19 DIAGNOSIS — I2510 Atherosclerotic heart disease of native coronary artery without angina pectoris: Secondary | ICD-10-CM | POA: Diagnosis not present

## 2019-07-19 DIAGNOSIS — Z5329 Procedure and treatment not carried out because of patient's decision for other reasons: Secondary | ICD-10-CM | POA: Diagnosis not present

## 2019-07-19 DIAGNOSIS — I4891 Unspecified atrial fibrillation: Secondary | ICD-10-CM | POA: Diagnosis not present

## 2019-07-19 DIAGNOSIS — F1010 Alcohol abuse, uncomplicated: Secondary | ICD-10-CM | POA: Diagnosis present

## 2019-07-19 DIAGNOSIS — K861 Other chronic pancreatitis: Secondary | ICD-10-CM | POA: Diagnosis present

## 2019-07-19 DIAGNOSIS — R1013 Epigastric pain: Secondary | ICD-10-CM

## 2019-07-19 DIAGNOSIS — I252 Old myocardial infarction: Secondary | ICD-10-CM | POA: Diagnosis not present

## 2019-07-19 DIAGNOSIS — K859 Acute pancreatitis without necrosis or infection, unspecified: Secondary | ICD-10-CM | POA: Diagnosis not present

## 2019-07-19 LAB — CBC
HCT: 40.7 % (ref 39.0–52.0)
Hemoglobin: 14.1 g/dL (ref 13.0–17.0)
MCH: 32.7 pg (ref 26.0–34.0)
MCHC: 34.6 g/dL (ref 30.0–36.0)
MCV: 94.4 fL (ref 80.0–100.0)
Platelets: 165 10*3/uL (ref 150–400)
RBC: 4.31 MIL/uL (ref 4.22–5.81)
RDW: 13.6 % (ref 11.5–15.5)
WBC: 7.2 10*3/uL (ref 4.0–10.5)
nRBC: 0 % (ref 0.0–0.2)

## 2019-07-19 LAB — BASIC METABOLIC PANEL
Anion gap: 13 (ref 5–15)
BUN: <5 mg/dL — ABNORMAL LOW (ref 8–23)
CO2: 21 mmol/L — ABNORMAL LOW (ref 22–32)
Calcium: 8.7 mg/dL — ABNORMAL LOW (ref 8.9–10.3)
Chloride: 101 mmol/L (ref 98–111)
Creatinine, Ser: 0.83 mg/dL (ref 0.61–1.24)
GFR calc Af Amer: >60 mL/min (ref >60)
GFR calc non Af Amer: >60 mL/min (ref >60)
Glucose, Bld: 102 mg/dL — ABNORMAL HIGH (ref 70–99)
Potassium: 2.8 mmol/L — ABNORMAL LOW (ref 3.5–5.1)
Sodium: 135 mmol/L (ref 135–145)

## 2019-07-19 LAB — URINALYSIS, ROUTINE W REFLEX MICROSCOPIC
Bilirubin Urine: NEGATIVE
Glucose, UA: NEGATIVE mg/dL
Hgb urine dipstick: NEGATIVE
Ketones, ur: NEGATIVE mg/dL
Leukocytes,Ua: NEGATIVE
Nitrite: NEGATIVE
Protein, ur: NEGATIVE mg/dL
Specific Gravity, Urine: 1.001 — ABNORMAL LOW (ref 1.005–1.030)
pH: 6 (ref 5.0–8.0)

## 2019-07-19 LAB — LIPASE, BLOOD: Lipase: 158 U/L — ABNORMAL HIGH (ref 11–51)

## 2019-07-19 LAB — TROPONIN I (HIGH SENSITIVITY): Troponin I (High Sensitivity): 6 ng/L (ref <18)

## 2019-07-19 MED ORDER — LACTATED RINGERS IV BOLUS
1000.0000 mL | Freq: Once | INTRAVENOUS | Status: AC
  Administered 2019-07-20: 1000 mL via INTRAVENOUS

## 2019-07-19 MED ORDER — SODIUM CHLORIDE 0.9% FLUSH: 3.0000 mL | Freq: Once | INTRAVENOUS | Status: DC

## 2019-07-19 MED ORDER — MORPHINE SULFATE (PF) 4 MG/ML IV SOLN
4.0000 mg | INTRAVENOUS | Status: DC | PRN
  Administered 2019-07-20: 4 mg via INTRAVENOUS
  Filled 2019-07-19: qty 1

## 2019-07-19 MED ORDER — ONDANSETRON HCL 4 MG/2ML IJ SOLN
4.0000 mg | Freq: Once | INTRAMUSCULAR | Status: AC
  Administered 2019-07-20: 4 mg via INTRAVENOUS
  Filled 2019-07-19: qty 2

## 2019-07-19 NOTE — ED Triage Notes (Signed)
Pt arrives to ED via POV with friend. Pt c/o dizziness "head feels floaty", abd pain radiating to his back, and is unable to keep his balance. Pt states it started Tuesday and he tried to ignore it.  NAD noted, pt able to walk into hospital.

## 2019-07-19 NOTE — ED Provider Notes (Signed)
[facility] Emergency Department Provider Note    First MD Initiated Contact with Patient 07/19/19 2301     (approximate)  I have reviewed the triage vital signs and the nursing notes.   HISTORY  Chief Complaint Chest Pain    HPI Thomas Coffey is a 64 y.o. male with a history of pancreatitis and persistent alcohol use presents to the ER for evaluation of epigastric pain radiating through to his back feeling lightheaded as he is not been able to keep any food or liquid down for the past 2 days.  Denies any fevers.  Denies any chest pain or shortness of breath.  Denies any lateralizing weakness.  Just feels weak and lightheaded when he stands.    Past Medical History:  Diagnosis Date  . MI (myocardial infarction) (HCC)    Family History  Problem Relation Age of Onset  . Cancer Mother   . Kidney disease Brother    History reviewed. No pertinent surgical history. Patient Active Problem List   Diagnosis Date Noted  . Acute pancreatitis 07/20/2019  . Hypomagnesemia 11/24/2018  . Protein-calorie malnutrition, severe 11/23/2018  . Pleural effusion on left   . Liver function test abnormality   . AKI (acute kidney injury) (HCC)   . Atrial fibrillation with rapid ventricular response (HCC)   . Acute hepatitis 11/17/2018  . Atrial fibrillation with RVR (HCC) 11/17/2018  . Alcohol abuse 11/17/2018  . Tobacco dependence syndrome 11/17/2018  . Empyema (HCC)       Prior to Admission medications   Medication Sig Start Date End Date Taking? Authorizing Provider  acetaminophen (TYLENOL) 325 MG tablet Take 2 tablets (650 mg total) by mouth every 6 (six) hours as needed for mild pain or fever. 11/25/18   Lorretta Harp, MD  amoxicillin-clavulanate (AUGMENTIN) 875-125 MG tablet Take 1 tablet by mouth every 12 (twelve) hours. 11/25/18   Lorretta Harp, MD  aspirin EC 81 MG EC tablet Take 1 tablet (81 mg total) by mouth daily. 11/26/18   Lorretta Harp, MD  folic acid  (FOLVITE) 1 MG tablet Take 1 tablet (1 mg total) by mouth daily. 11/26/18   Lorretta Harp, MD  Multiple Vitamin (MULTIVITAMIN WITH MINERALS) TABS tablet Take 1 tablet by mouth daily. 11/26/18   Lorretta Harp, MD  nicotine (NICODERM CQ - DOSED IN MG/24 HOURS) 21 mg/24hr patch Place 1 patch (21 mg total) onto the skin daily. 11/26/18   Lorretta Harp, MD  ondansetron (ZOFRAN) 4 MG tablet Take 1 tablet (4 mg total) by mouth daily as needed for nausea or vomiting. 05/13/19   Minna Antis, MD  oxyCODONE-acetaminophen (PERCOCET) 5-325 MG tablet Take 1 tablet by mouth every 4 (four) hours as needed for severe pain. 05/13/19   Minna Antis, MD  thiamine 100 MG tablet Take 1 tablet (100 mg total) by mouth daily. 11/26/18   Lorretta Harp, MD    Allergies Patient has no known allergies.    Social History Social History   Tobacco Use  . Smoking status: Current Every Day Smoker    Packs/day: 1.00    Years: 30.00    Pack years: 30.00    Types: Cigarettes  . Smokeless tobacco: Never Used  Substance Use Topics  . Alcohol use: Yes    Alcohol/week: 6.0 standard drinks    Types: 6 Cans of beer per week    Comment: daily  . Drug use: Yes    Types: Marijuana    Review of Systems Patient denies headaches,  rhinorrhea, blurry vision, numbness, shortness of breath, chest pain, edema, cough, abdominal pain, nausea, vomiting, diarrhea, dysuria, fevers, rashes or hallucinations unless otherwise stated above in HPI. ____________________________________________   PHYSICAL EXAM:  VITAL SIGNS: Vitals:   07/20/19 0352 07/20/19 0440  BP: 128/76 119/80  Pulse: 97 86  Resp: 18 17  Temp: 98.2 F (36.8 C) 97.9 F (36.6 C)  SpO2: 99% 96%    Constitutional: Alert and oriented.  Eyes: Conjunctivae are normal.  Head: Atraumatic. Nose: No congestion/rhinnorhea. Mouth/Throat: Mucous membranes are moist.   Neck: No stridor. Painless ROM.  Cardiovascular: Normal rate, regular rhythm. Grossly normal heart  sounds.  Good peripheral circulation. Respiratory: Normal respiratory effort.  No retractions. Lungs CTAB. Gastrointestinal: Soft with mild epigastric ttp. No distention. No abdominal bruits. No CVA tenderness. Genitourinary:  Musculoskeletal: No lower extremity tenderness nor edema.  No joint effusions. Neurologic:  Normal speech and language. No gross focal neurologic deficits are appreciated. No facial droop Skin:  Skin is warm, dry and intact. No rash noted. Psychiatric: Mood and affect are normal. Speech and behavior are normal.  ____________________________________________   LABS (all labs ordered are listed, but only abnormal results are displayed)  Results for orders placed or performed during the hospital encounter of 07/19/19 (from the past 24 hour(s))  Basic metabolic panel     Status: Abnormal   Collection Time: 07/19/19  7:27 PM  Result Value Ref Range   Sodium 135 135 - 145 mmol/L   Potassium 2.8 (L) 3.5 - 5.1 mmol/L   Chloride 101 98 - 111 mmol/L   CO2 21 (L) 22 - 32 mmol/L   Glucose, Bld 102 (H) 70 - 99 mg/dL   BUN <5 (L) 8 - 23 mg/dL   Creatinine, Ser 0.93 0.61 - 1.24 mg/dL   Calcium 8.7 (L) 8.9 - 10.3 mg/dL   GFR calc non Af Amer >60 >60 mL/min   GFR calc Af Amer >60 >60 mL/min   Anion gap 13 5 - 15  CBC     Status: None   Collection Time: 07/19/19  7:27 PM  Result Value Ref Range   WBC 7.2 4.0 - 10.5 K/uL   RBC 4.31 4.22 - 5.81 MIL/uL   Hemoglobin 14.1 13.0 - 17.0 g/dL   HCT 23.5 39 - 52 %   MCV 94.4 80.0 - 100.0 fL   MCH 32.7 26.0 - 34.0 pg   MCHC 34.6 30.0 - 36.0 g/dL   RDW 57.3 22.0 - 25.4 %   Platelets 165 150 - 400 K/uL   nRBC 0.0 0.0 - 0.2 %  Troponin I (High Sensitivity)     Status: None   Collection Time: 07/19/19  7:27 PM  Result Value Ref Range   Troponin I (High Sensitivity) 6 <18 ng/L  Lipase, blood     Status: Abnormal   Collection Time: 07/19/19  7:27 PM  Result Value Ref Range   Lipase 158 (H) 11 - 51 U/L  Urinalysis, Routine w  reflex microscopic     Status: Abnormal   Collection Time: 07/19/19  7:27 PM  Result Value Ref Range   Color, Urine YELLOW (A) YELLOW   APPearance HAZY (A) CLEAR   Specific Gravity, Urine 1.001 (L) 1.005 - 1.030   pH 6.0 5.0 - 8.0   Glucose, UA NEGATIVE NEGATIVE mg/dL   Hgb urine dipstick NEGATIVE NEGATIVE   Bilirubin Urine NEGATIVE NEGATIVE   Ketones, ur NEGATIVE NEGATIVE mg/dL   Protein, ur NEGATIVE NEGATIVE mg/dL   Nitrite  NEGATIVE NEGATIVE   Leukocytes,Ua NEGATIVE NEGATIVE  Troponin I (High Sensitivity)     Status: None   Collection Time: 07/20/19 12:03 AM  Result Value Ref Range   Troponin I (High Sensitivity) 6 <18 ng/L  Magnesium     Status: None   Collection Time: 07/20/19 12:04 AM  Result Value Ref Range   Magnesium 1.7 1.7 - 2.4 mg/dL  CBC     Status: Abnormal   Collection Time: 07/20/19  5:34 AM  Result Value Ref Range   WBC 6.8 4.0 - 10.5 K/uL   RBC 4.04 (L) 4.22 - 5.81 MIL/uL   Hemoglobin 13.1 13.0 - 17.0 g/dL   HCT 13.0 (L) 39 - 52 %   MCV 94.1 80.0 - 100.0 fL   MCH 32.4 26.0 - 34.0 pg   MCHC 34.5 30.0 - 36.0 g/dL   RDW 86.5 78.4 - 69.6 %   Platelets 157 150 - 400 K/uL   nRBC 0.0 0.0 - 0.2 %  Lipase, blood     Status: Abnormal   Collection Time: 07/20/19  5:34 AM  Result Value Ref Range   Lipase 90 (H) 11 - 51 U/L  Comprehensive metabolic panel     Status: Abnormal   Collection Time: 07/20/19  5:34 AM  Result Value Ref Range   Sodium 137 135 - 145 mmol/L   Potassium 3.1 (L) 3.5 - 5.1 mmol/L   Chloride 103 98 - 111 mmol/L   CO2 25 22 - 32 mmol/L   Glucose, Bld 99 70 - 99 mg/dL   BUN <5 (L) 8 - 23 mg/dL   Creatinine, Ser 2.95 0.61 - 1.24 mg/dL   Calcium 8.4 (L) 8.9 - 10.3 mg/dL   Total Protein 6.3 (L) 6.5 - 8.1 g/dL   Albumin 2.9 (L) 3.5 - 5.0 g/dL   AST 284 (H) 15 - 41 U/L   ALT 31 0 - 44 U/L   Alkaline Phosphatase 135 (H) 38 - 126 U/L   Total Bilirubin 1.3 (H) 0.3 - 1.2 mg/dL   GFR calc non Af Amer >60 >60 mL/min   GFR calc Af Amer >60 >60 mL/min    Anion gap 9 5 - 15  Magnesium     Status: Abnormal   Collection Time: 07/20/19  5:34 AM  Result Value Ref Range   Magnesium 1.5 (L) 1.7 - 2.4 mg/dL  Phosphorus     Status: None   Collection Time: 07/20/19  5:34 AM  Result Value Ref Range   Phosphorus 3.7 2.5 - 4.6 mg/dL   ____________________________________________  EKG My review and personal interpretation at Time: 19:09   Indication: abd pain  Rate: 105  Rhythm: sinus Axis: normal Other: nonspecific st abm, no stemi  ____________________________________________  RADIOLOGY  I personally reviewed all radiographic images ordered to evaluate for the above acute complaints and reviewed radiology reports and findings.  These findings were personally discussed with the patient.  Please see medical record for radiology report.  ____________________________________________   PROCEDURES  Procedure(s) performed:  Procedures    Critical Care performed: no ____________________________________________   INITIAL IMPRESSION / ASSESSMENT AND PLAN / ED COURSE  Pertinent labs & imaging results that were available during my care of the patient were reviewed by me and considered in my medical decision making (see chart for details).   DDX: pancreatitis, gastritis, enteritis, acs, pna, colitis  Thomas Coffey is a 64 y.o. who presents to the ED with epigastric pain as described above.  CT imaging  ordered for above differential.  Not complaining of any diarrhea have a lower suspicion for infectious colitis but CT imaging is concerning for colitis and possible IBD.  With lipase being elevated however I am still concerned for early pancreatitis.  Patient does feel weak and lightheaded particularly standing suspect some dehydration.  Is hypokalemic.  Will give IV potassium.  Will discuss with hospitalist for admission for IV fluids and pain control.     The patient was evaluated in Emergency Department today for the symptoms described in  the history of present illness. He/she was evaluated in the context of the global COVID-19 pandemic, which necessitated consideration that the patient might be at risk for infection with the SARS-CoV-2 virus that causes COVID-19. Institutional protocols and algorithms that pertain to the evaluation of patients at risk for COVID-19 are in a state of rapid change based on information released by regulatory bodies including the CDC and federal and state organizations. These policies and algorithms were followed during the patient's care in the ED.  As part of my medical decision making, I reviewed the following data within the electronic MEDICAL RECORD NUMBER Nursing notes reviewed and incorporated, Labs reviewed, notes from prior ED visits and [place] Controlled Substance Database   ____________________________________________   FINAL CLINICAL IMPRESSION(S) / ED DIAGNOSES  Final diagnoses:  Epigastric pain      NEW MEDICATIONS STARTED DURING THIS VISIT:  Current Discharge Medication List       Note:  This document was prepared using Dragon voice recognition software and may include unintentional dictation errors.    Willy Eddy, MD 07/20/19 [phone]

## 2019-07-19 NOTE — ED Notes (Signed)
First Nurse Note: Pt c/o diarrhea, lightheadedness, and body aches. Pt is in NAD.

## 2019-07-19 NOTE — ED Notes (Signed)
Patient transported to X-ray 

## 2019-07-19 NOTE — ED Notes (Signed)
Patient reports symptoms off/on for approximate a week.  Reports feeling dizzy headed, off balance and having abdominal pain.  Patient denies nausea and vomiting.  Patient with no acute distress noted.

## 2019-07-20 ENCOUNTER — Emergency Department: Payer: Medicaid Other

## 2019-07-20 ENCOUNTER — Encounter: Payer: Self-pay | Admitting: Radiology

## 2019-07-20 DIAGNOSIS — K529 Noninfective gastroenteritis and colitis, unspecified: Secondary | ICD-10-CM | POA: Diagnosis not present

## 2019-07-20 DIAGNOSIS — K8500 Idiopathic acute pancreatitis without necrosis or infection: Secondary | ICD-10-CM | POA: Diagnosis not present

## 2019-07-20 DIAGNOSIS — K859 Acute pancreatitis without necrosis or infection, unspecified: Secondary | ICD-10-CM | POA: Diagnosis present

## 2019-07-20 DIAGNOSIS — E876 Hypokalemia: Secondary | ICD-10-CM | POA: Diagnosis not present

## 2019-07-20 LAB — SARS CORONAVIRUS 2 BY RT PCR (HOSPITAL ORDER, PERFORMED IN ~~LOC~~ HOSPITAL LAB): SARS Coronavirus 2: NEGATIVE

## 2019-07-20 LAB — CBC
HCT: 38 % — ABNORMAL LOW (ref 39.0–52.0)
Hemoglobin: 13.1 g/dL (ref 13.0–17.0)
MCH: 32.4 pg (ref 26.0–34.0)
MCHC: 34.5 g/dL (ref 30.0–36.0)
MCV: 94.1 fL (ref 80.0–100.0)
Platelets: 157 10*3/uL (ref 150–400)
RBC: 4.04 MIL/uL — ABNORMAL LOW (ref 4.22–5.81)
RDW: 13.6 % (ref 11.5–15.5)
WBC: 6.8 10*3/uL (ref 4.0–10.5)
nRBC: 0 % (ref 0.0–0.2)

## 2019-07-20 LAB — COMPREHENSIVE METABOLIC PANEL
ALT: 31 U/L (ref 0–44)
AST: 143 U/L — ABNORMAL HIGH (ref 15–41)
Albumin: 2.9 g/dL — ABNORMAL LOW (ref 3.5–5.0)
Alkaline Phosphatase: 135 U/L — ABNORMAL HIGH (ref 38–126)
Anion gap: 9 (ref 5–15)
BUN: <5 mg/dL — ABNORMAL LOW (ref 8–23)
CO2: 25 mmol/L (ref 22–32)
Calcium: 8.4 mg/dL — ABNORMAL LOW (ref 8.9–10.3)
Chloride: 103 mmol/L (ref 98–111)
Creatinine, Ser: 0.71 mg/dL (ref 0.61–1.24)
GFR calc Af Amer: >60 mL/min (ref >60)
GFR calc non Af Amer: >60 mL/min (ref >60)
Glucose, Bld: 99 mg/dL (ref 70–99)
Potassium: 3.1 mmol/L — ABNORMAL LOW (ref 3.5–5.1)
Sodium: 137 mmol/L (ref 135–145)
Total Bilirubin: 1.3 mg/dL — ABNORMAL HIGH (ref 0.3–1.2)
Total Protein: 6.3 g/dL — ABNORMAL LOW (ref 6.5–8.1)

## 2019-07-20 LAB — TROPONIN I (HIGH SENSITIVITY): Troponin I (High Sensitivity): 6 ng/L (ref <18)

## 2019-07-20 LAB — MAGNESIUM
Magnesium: 1.5 mg/dL — ABNORMAL LOW (ref 1.7–2.4)
Magnesium: 1.7 mg/dL (ref 1.7–2.4)

## 2019-07-20 LAB — PHOSPHORUS: Phosphorus: 3.7 mg/dL (ref 2.5–4.6)

## 2019-07-20 LAB — LIPASE, BLOOD: Lipase: 90 U/L — ABNORMAL HIGH (ref 11–51)

## 2019-07-20 MED ORDER — THIAMINE HCL 100 MG/ML IJ SOLN: 100.0000 mg | Freq: Every day | INTRAMUSCULAR | Status: DC

## 2019-07-20 MED ORDER — FOLIC ACID 1 MG PO TABS: 1.0000 mg | ORAL_TABLET | Freq: Every day | ORAL | Status: DC

## 2019-07-20 MED ORDER — METRONIDAZOLE IN NACL 5-0.79 MG/ML-% IV SOLN
500.0000 mg | Freq: Three times a day (TID) | INTRAVENOUS | Status: DC
  Administered 2019-07-20: 06:00:00 500 mg via INTRAVENOUS
  Filled 2019-07-20 (×3): qty 100

## 2019-07-20 MED ORDER — LORAZEPAM 1 MG PO TABS: 1.0000 mg | ORAL_TABLET | ORAL | Status: DC | PRN

## 2019-07-20 MED ORDER — KETOROLAC TROMETHAMINE 30 MG/ML IJ SOLN: 15.0000 mg | Freq: Four times a day (QID) | INTRAMUSCULAR | Status: DC | PRN

## 2019-07-20 MED ORDER — ENOXAPARIN SODIUM 40 MG/0.4ML ~~LOC~~ SOLN
40.0000 mg | SUBCUTANEOUS | Status: DC
  Filled 2019-07-20: qty 0.4

## 2019-07-20 MED ORDER — ACETAMINOPHEN 650 MG RE SUPP: 650.0000 mg | Freq: Four times a day (QID) | RECTAL | Status: DC | PRN

## 2019-07-20 MED ORDER — ONDANSETRON HCL 4 MG PO TABS: 4.0000 mg | ORAL_TABLET | Freq: Four times a day (QID) | ORAL | Status: DC | PRN

## 2019-07-20 MED ORDER — THIAMINE HCL 100 MG PO TABS: 100.0000 mg | ORAL_TABLET | Freq: Every day | ORAL | Status: DC

## 2019-07-20 MED ORDER — MORPHINE SULFATE (PF) 2 MG/ML IV SOLN: 2.0000 mg | INTRAVENOUS | Status: DC | PRN

## 2019-07-20 MED ORDER — ASPIRIN EC 81 MG PO TBEC: 81.0000 mg | DELAYED_RELEASE_TABLET | Freq: Every day | ORAL | Status: DC

## 2019-07-20 MED ORDER — THIAMINE HCL 100 MG PO TABS
100.0000 mg | ORAL_TABLET | Freq: Every day | ORAL | Status: DC
  Filled 2019-07-20: qty 1

## 2019-07-20 MED ORDER — OXYCODONE-ACETAMINOPHEN 5-325 MG PO TABS
1.0000 | ORAL_TABLET | ORAL | Status: DC | PRN
  Administered 2019-07-20: 1 via ORAL
  Filled 2019-07-20: qty 1

## 2019-07-20 MED ORDER — LORAZEPAM 2 MG/ML IJ SOLN: 1.0000 mg | INTRAMUSCULAR | Status: DC | PRN

## 2019-07-20 MED ORDER — LORAZEPAM 2 MG PO TABS: 0.0000 mg | ORAL_TABLET | Freq: Two times a day (BID) | ORAL | Status: DC

## 2019-07-20 MED ORDER — MAGNESIUM SULFATE 4 GM/100ML IV SOLN
4.0000 g | Freq: Once | INTRAVENOUS | Status: DC
  Filled 2019-07-20: qty 100

## 2019-07-20 MED ORDER — ACETAMINOPHEN 325 MG PO TABS: 650.0000 mg | ORAL_TABLET | Freq: Four times a day (QID) | ORAL | Status: DC | PRN

## 2019-07-20 MED ORDER — NICOTINE 21 MG/24HR TD PT24
21.0000 mg | MEDICATED_PATCH | Freq: Every day | TRANSDERMAL | Status: DC
  Filled 2019-07-20: qty 1

## 2019-07-20 MED ORDER — PANTOPRAZOLE SODIUM 40 MG IV SOLR
40.0000 mg | Freq: Two times a day (BID) | INTRAVENOUS | Status: DC
  Filled 2019-07-20: qty 40

## 2019-07-20 MED ORDER — LORAZEPAM 1 MG PO TABS
1.0000 mg | ORAL_TABLET | ORAL | Status: DC
  Administered 2019-07-20: 1 mg via ORAL
  Filled 2019-07-20: qty 1

## 2019-07-20 MED ORDER — POTASSIUM CHLORIDE 10 MEQ/100ML IV SOLN
10.0000 meq | Freq: Once | INTRAVENOUS | Status: AC
  Administered 2019-07-20: 10 meq via INTRAVENOUS
  Filled 2019-07-20: qty 100

## 2019-07-20 MED ORDER — FOLIC ACID 1 MG PO TABS
1.0000 mg | ORAL_TABLET | Freq: Every day | ORAL | Status: DC
  Filled 2019-07-20: qty 1

## 2019-07-20 MED ORDER — ONDANSETRON HCL 4 MG/2ML IJ SOLN: 4.0000 mg | Freq: Four times a day (QID) | INTRAMUSCULAR | Status: DC | PRN

## 2019-07-20 MED ORDER — SODIUM CHLORIDE 0.9 % IV SOLN
2.0000 g | INTRAVENOUS | Status: DC
  Administered 2019-07-20: 07:00:00 2 g via INTRAVENOUS
  Filled 2019-07-20 (×2): qty 20

## 2019-07-20 MED ORDER — ADULT MULTIVITAMIN W/MINERALS CH: 1.0000 | ORAL_TABLET | Freq: Every day | ORAL | Status: DC

## 2019-07-20 MED ORDER — LORAZEPAM 2 MG PO TABS: 0.0000 mg | ORAL_TABLET | Freq: Four times a day (QID) | ORAL | Status: DC

## 2019-07-20 MED ORDER — POTASSIUM CHLORIDE CRYS ER 20 MEQ PO TBCR
40.0000 meq | EXTENDED_RELEASE_TABLET | Freq: Once | ORAL | Status: AC
  Administered 2019-07-20: 40 meq via ORAL
  Filled 2019-07-20: qty 2

## 2019-07-20 MED ORDER — POTASSIUM CHLORIDE CRYS ER 20 MEQ PO TBCR
40.0000 meq | EXTENDED_RELEASE_TABLET | ORAL | Status: DC
  Filled 2019-07-20: qty 2

## 2019-07-20 MED ORDER — IOHEXOL 300 MG/ML  SOLN
100.0000 mL | Freq: Once | INTRAMUSCULAR | Status: AC | PRN
  Administered 2019-07-20: 100 mL via INTRAVENOUS

## 2019-07-20 MED ORDER — POTASSIUM CHLORIDE 20 MEQ PO PACK: 40.0000 meq | PACK | Freq: Once | ORAL | Status: DC

## 2019-07-20 MED ORDER — ONDANSETRON HCL 4 MG PO TABS: 4.0000 mg | ORAL_TABLET | Freq: Every day | ORAL | Status: DC | PRN

## 2019-07-20 MED ORDER — TRAZODONE HCL 50 MG PO TABS: 25.0000 mg | ORAL_TABLET | Freq: Every evening | ORAL | Status: DC | PRN

## 2019-07-20 MED ORDER — ADULT MULTIVITAMIN W/MINERALS CH
1.0000 | ORAL_TABLET | Freq: Every day | ORAL | Status: DC
  Filled 2019-07-20: qty 1

## 2019-07-20 MED ORDER — SODIUM CHLORIDE 0.9 % IV SOLN: INTRAVENOUS | Status: DC

## 2019-07-20 NOTE — Progress Notes (Signed)
Pt refusing all medications, telemetry, and assessment. Pt complaining about need to HEPA filter to be on until his Covid test came back and was negative. Pt did not want it on regardless.This RN and charge nurse Cherylynn Ridges spoke to pt about patient care and about safety. Pt wanted IV removed and to leave. Pt signed AMA and left.

## 2019-07-20 NOTE — Progress Notes (Signed)
Pt refusing treatment. Left AMA

## 2019-07-20 NOTE — H&P (Signed)
[place] [facility]   PATIENT NAME: Thomas Coffey    MR#:  [id number]  DATE OF BIRTH:  [birth year]  DATE OF ADMISSION:  07/19/2019  PRIMARY CARE PHYSICIAN: Patient, [name]   REQUESTING/REFERRING PHYSICIAN: Willy Eddy, MD  CHIEF COMPLAINT:   Chief Complaint  Patient presents with  . Chest Pain    HISTORY OF PRESENT ILLNESS:  Thomas Coffey  is a 64 y.o. African-American male with a known history of coronary artery disease status post MI,, atrial fibrillation, tobacco and alcohol abuse, who presented to the emergency room with acute onset of epigastric pain with radiation to his back over the last week with associated intractable nausea and vomiting.  His last alcoholic drink was yesterday.  He averages about half pint of alcoholic drinks per day.  He denied any fever or chills.  He has not had any bowel movements due to lack of p.o. intake.  He admits to cough without wheezing or dyspnea or chest pain.  No melena or bright red bleeding per rectum.  No dysuria, oliguria or hematuria or flank pain.  No headache or dizziness or blurred vision.  Upon presentation to the emergency room, heart rate was 114 with otherwise normal vital signs.  Labs revealed hypokalemia of 2.8 with magnesium level 1.7 lipase was 158 and high-sensitivity troponin I was 6 and later the same.  CBC was within normal.  Urinalysis was unremarkable.  Two-view chest x-ray showed no acute cardiopulmonary disease.  Abdominal and pelvic CT scan revealed the following: 1. Mild distal colonic thickening from the level of the descending to rectosigmoid with some faint pericolonic stranding and increased vascularity, suggestive of mild colitis, which may be infectious or inflammatory in etiology. 2. Postsurgical changes from right hemicolectomy with ileocecal anastomosis. Anastomotic line appears patent. 3. No evidence of bowel obstruction. 4. Prostatomegaly with indentation of the bladder base.  Mild bladder wall thickening slightly greater than expected for underdistention with some faint perivesicular haze, could reflect cystitis or features of chronic outlet obstruction. Correlate with symptoms and urinalysis. 5. Hepatic steatosis. 6. Cholelithiasis without convincing CT evidence of acute cholecystitis. 7. Resolution of the previously seen complex left empyema. 8. Aortic Atherosclerosis  The patient was given 1 L bolus of IV lactated Ringer and 4 mg IV morphine sulfate as well as 4 mg of IV Zofran and 10 mEq IV potassium chloride.  He will be admitted to a medical bed for further evaluation and management.  PAST MEDICAL HISTORY:   Past Medical History:  Diagnosis Date  . MI (myocardial infarction) (HCC)   Atrial fibrillation Tobacco abuse Alcohol abuse  PAST SURGICAL HISTORY:  Laparotomy for partial colon resection.  SOCIAL HISTORY:   Social History   Tobacco Use  . Smoking status: Current Every Day Smoker    Packs/day: 1.00    Years: 30.00    Pack years: 30.00    Types: Cigarettes  . Smokeless tobacco: Never Used  Substance Use Topics  . Alcohol use: Yes    Alcohol/week: 6.0 standard drinks    Types: 6 Cans of beer per week    Comment: daily    FAMILY HISTORY:   Family History  Problem Relation Age of Onset  . Cancer Mother   . Kidney disease Brother     DRUG ALLERGIES:  No Known Allergies  REVIEW OF SYSTEMS:   ROS As per history of present illness. All pertinent systems were reviewed above. Constitutional,  HEENT, cardiovascular, respiratory, GI,  GU, musculoskeletal, neuro, psychiatric, endocrine,  integumentary and hematologic systems were reviewed and are otherwise  negative/unremarkable except for positive findings mentioned above in the HPI.   MEDICATIONS AT HOME:   Prior to Admission medications   Medication Sig Start Date End Date Taking? Authorizing Provider  acetaminophen (TYLENOL) 325 MG tablet Take 2 tablets (650 mg total) by  mouth every 6 (six) hours as needed for mild pain or fever. 11/25/18   Lorretta Harp, MD  amoxicillin-clavulanate (AUGMENTIN) 875-125 MG tablet Take 1 tablet by mouth every 12 (twelve) hours. 11/25/18   Lorretta Harp, MD  aspirin EC 81 MG EC tablet Take 1 tablet (81 mg total) by mouth daily. 11/26/18   Lorretta Harp, MD  folic acid (FOLVITE) 1 MG tablet Take 1 tablet (1 mg total) by mouth daily. 11/26/18   Lorretta Harp, MD  Multiple Vitamin (MULTIVITAMIN WITH MINERALS) TABS tablet Take 1 tablet by mouth daily. 11/26/18   Lorretta Harp, MD  nicotine (NICODERM CQ - DOSED IN MG/24 HOURS) 21 mg/24hr patch Place 1 patch (21 mg total) onto the skin daily. 11/26/18   Lorretta Harp, MD  ondansetron (ZOFRAN) 4 MG tablet Take 1 tablet (4 mg total) by mouth daily as needed for nausea or vomiting. 05/13/19   Minna Antis, MD  oxyCODONE-acetaminophen (PERCOCET) 5-325 MG tablet Take 1 tablet by mouth every 4 (four) hours as needed for severe pain. 05/13/19   Minna Antis, MD  thiamine 100 MG tablet Take 1 tablet (100 mg total) by mouth daily. 11/26/18   Lorretta Harp, MD      VITAL SIGNS:  Blood pressure 137/86, pulse (!) 114, temperature 98.1 F (36.7 C), temperature source Oral, resp. rate 18, height 5\' 8"  (1.727 m), weight 77.1 kg, SpO2 100 %.  PHYSICAL EXAMINATION:  Physical Exam  GENERAL:  64 y.o.-year-old African-American male patient lying in the bed with no acute distress.  He is slightly anxious and restless. EYES: Pupils equal, round, reactive to light and accommodation. No scleral icterus. Extraocular muscles intact.  HEENT: Head atraumatic, normocephalic. Oropharynx and nasopharynx clear.  NECK:  Supple, no jugular venous distention. No thyroid enlargement, no tenderness.  LUNGS: Normal breath sounds bilaterally, no wheezing, rales,rhonchi or crepitation. No use of accessory muscles of respiration.  CARDIOVASCULAR: Regular rate and rhythm, S1, S2 normal. No murmurs, rubs, or gallops.  ABDOMEN: Soft, with  epigastric and left upper quadrant tenderness without rebound tenderness guarding or rigidity.  Bowel sounds present. No organomegaly or mass.  EXTREMITIES: No pedal edema, cyanosis, or clubbing.  NEUROLOGIC: Cranial nerves II through XII are intact. Muscle strength 5/5 in all extremities. Sensation intact. Gait not checked.  PSYCHIATRIC: The patient is alert and oriented x 3.  Normal affect and good eye contact. SKIN: No obvious rash, lesion, or ulcer.   LABORATORY PANEL:   CBC Recent Labs  Lab 07/19/19 1927  WBC 7.2  HGB 14.1  HCT 40.7  PLT 165   ------------------------------------------------------------------------------------------------------------------  Chemistries  Recent Labs  Lab 07/19/19 1927 07/20/19 0004  NA 135  --   K 2.8*  --   CL 101  --   CO2 21*  --   GLUCOSE 102*  --   BUN <5*  --   CREATININE 0.83  --   CALCIUM 8.7*  --   MG  --  1.7   ------------------------------------------------------------------------------------------------------------------  Cardiac Enzymes No results for input(s): TROPONINI in the last 168 hours. ------------------------------------------------------------------------------------------------------------------  RADIOLOGY:  DG Chest 2 View  Result Date: 07/19/2019 CLINICAL DATA:  Dizziness and abdominal pain. EXAM: CHEST - 2 VIEW COMPARISON:  May 13, 2019 FINDINGS: There is no evidence of acute infiltrate, pleural effusion or pneumothorax. The heart size and mediastinal contours are within normal limits. There is mild calcification of the aortic arch. Multilevel degenerative changes seen throughout the thoracic spine. Radiopaque surgical clips are seen overlying the right upper quadrant. IMPRESSION: No active cardiopulmonary disease. Electronically Signed   By: Aram Candela M.D.   On: 07/19/2019 20:25   CT ABDOMEN PELVIS W CONTRAST  Result Date: 07/20/2019 CLINICAL DATA:  Abdominal distension, nausea and vomiting EXAM:  CT ABDOMEN AND PELVIS WITH CONTRAST TECHNIQUE: Multidetector CT imaging of the abdomen and pelvis was performed using the standard protocol following bolus administration of intravenous contrast. CONTRAST:  OMNIPAQUE IOHEXOL 300 MG/ML  SOLN COMPARISON:  CT 11/11/2018 FINDINGS: Lower chest: Resolution of the previously seen complex left empyema. Lung bases are clear. Normal heart size. No pericardial effusion. Coronary artery calcifications are present. Hepatobiliary: Diffuse hepatic hypoattenuation compatible with hepatic steatosis. Focal sparing along the gallbladder fossa. No concerning focal liver lesions. Smooth liver surface contour. Prominent fold at the gallbladder body with few calcified gallstones. No pericholecystic fluid or inflammation. No significant biliary ductal dilatation or visible calcified gallstones. Pancreas: Unremarkable. No pancreatic ductal dilatation or surrounding inflammatory changes. Spleen: Normal in size without focal abnormality. Adrenals/Urinary Tract: Normal adrenal glands. Kidneys enhance and excrete symmetrically. Stable mild bilateral symmetric perinephric stranding, a nonspecific finding though may correlate with either age or decreased renal function. Bilateral extrarenal pelves. No obstructive urolithiasis or hydronephrosis. Urinary bladder is largely decompressed at the time of exam and therefore poorly evaluated by CT imaging. Mild bladder wall thickening slightly greater than expected for underdistention with some faint perivesicular haze. Indentation of the bladder base by an enlarged prostate. Stomach/Bowel: Distal esophagus, stomach, and duodenum are unremarkable with a normal course across the midline abdomen. No small bowel thickening or dilatation. There are postsurgical changes from right hemicolectomy with ileocecal anastomosis. Anastomotic line appears patent. No colonic thickening or dilatation seen proximally. There is some mild distal colonic thickening  from the level of the descending to rectosigmoid with some faint pericolonic stranding and increased vascularity. No evidence of high-grade bowel obstruction. Vascular/Lymphatic: Atherosclerotic calcifications within the abdominal aorta and branch vessels. No aneurysm or ectasia. No enlarged abdominopelvic lymph nodes. Reproductive: Prostatomegaly with indentation of the bladder base. No concerning prostate seminal vesicle lesions. Calcification of the ductus deferens, most often seen in the setting of diabetes mellitus. Other: No abdominopelvic free fluid or free gas. No bowel containing hernias. Musculoskeletal: Multilevel degenerative changes are present in the imaged portions of the spine. Large bony spur at the T3 inferior endplate results in at least moderate canal stenosis similar to comparison. Additional multilevel facet degenerative changes are noted as well with severe bilateral foraminal narrowing L5-S1. stable degenerative changes in the hips and pelvis. No acute osseous abnormality or suspicious osseous lesion. IMPRESSION: 1. Mild distal colonic thickening from the level of the descending to rectosigmoid with some faint pericolonic stranding and increased vascularity, suggestive of mild colitis, which may be infectious or inflammatory in etiology. 2. Postsurgical changes from right hemicolectomy with ileocecal anastomosis. Anastomotic line appears patent. 3. No evidence of bowel obstruction. 4. Prostatomegaly with indentation of the bladder base. Mild bladder wall thickening slightly greater than expected for underdistention with some faint perivesicular haze, could reflect cystitis or features of chronic outlet obstruction. Correlate with symptoms and urinalysis. 5. Hepatic steatosis. 6. Cholelithiasis without  convincing CT evidence of acute cholecystitis. 7. Resolution of the previously seen complex left empyema. 8. Aortic Atherosclerosis (ICD10-I70.0). Electronically Signed   By: Kreg Shropshire M.D.    On: 07/20/2019 01:19      IMPRESSION AND PLAN:   1.  Acute pancreatitis likely associated colitis. -Patient will be admitted to a medical bed. -We will place him n.p.o. except for medications. -He will be hydrated with IV normal saline. -We will follow daily serum lipase. -We will place him on IV Rocephin and Flagyl. -Pain management will be provided. -He will be placed on multivitamins, folic acid and thiamine.  2.  Hypokalemia and hypomagnesemia. -Potassium will be replaced and magnesium level will be optimized.  3.  Alcohol abuse. -He will be placed on CIWA protocol and a banana bag daily.  3.  Tobacco abuse. -She was counseled for smoking cessation and will receive further counseling here. -NicoDerm CQ patch replaced for nicotine craving.  4.  Coronary artery disease. -We will hold aspirin given his recurrent nausea and vomiting.  5.  DVT prophylaxis. -Subcutaneous Lovenox.  All the records are reviewed and case discussed with ED provider. The plan of care was discussed in details with the patient (and family). I answered all questions. The patient agreed to proceed with the above mentioned plan. Further management will depend upon hospital course.   CODE STATUS: Full code  Status is: Inpatient  Remains inpatient appropriate because:Persistent severe electrolyte disturbances, Ongoing active pain requiring inpatient pain management, Unsafe d/c plan, IV treatments appropriate due to intensity of illness or inability to take PO and Inpatient level of care appropriate due to severity of illness   Dispo: The patient is from: Home              Anticipated d/c is to: Home              Anticipated d/c date is: 2 days              Patient currently is not medically stable to d/c.   TOTAL TIME TAKING CARE OF THIS PATIENT: 55 minutes.    Hannah Beat M.D on 07/20/2019 at 2:37 AM  [facility]   From 7 PM-7 AM, contact night-coverage [email or web address]  CC: Primary  care physician; Patient, [name]   Note: This dictation was prepared with Dragon dictation along with smaller phrase technology. Any transcriptional typo errors that result from this process are unintentional.

## 2019-07-20 NOTE — ED Notes (Signed)
Patient to CT via stretcher.

## 2019-07-20 NOTE — Progress Notes (Signed)
Pt arrived to unit from ED. Alert and oriented x 4. Observed with pack of cigarettes;however, he refused to give them to Clinical research associate. Pt educated about dangers of smoking in room. Pt voiced understanding. Pt also stated he has over $1000 in his wallet (unseen by Clinical research associate) and stated he would not allow security to take it.

## 2019-07-20 NOTE — ED Notes (Addendum)
Patient resting with eyes closed, easily aroused. Patient refusing COVID swab at this time.

## 2019-07-20 NOTE — ED Notes (Signed)
Dr. Arville Care in with patient.

## 2019-07-20 NOTE — Discharge Summary (Addendum)
Discharge Summary  DOA: 07/19/2019 DOD: 07/20/2019 PCP: None  The patient left the hospital AMA.  64 year old African-American male with PMH of CAD s/p MI, atrial fibrillation, tobacco and alcohol abuse was admitted overnight due to acute onset of epigastric pain with radiation to his back over the last week with associated intractable nausea and vomiting.  His last alcohol drink was day prior to admission.  He averages about half a pint of alcohol drinks per day.  Upon presentation to the emergency room, heart rate was 114 with otherwise normal vital signs.  Labs revealed hypokalemia of 2.8 with magnesium level 1.7 lipase was 158 and high-sensitivity troponin I was 6 and later the same.  CBC was within normal.  Urinalysis was unremarkable.  Two-view chest x-ray showed no acute cardiopulmonary disease.  Abdominal and pelvic CT scan revealed the following: 1. Mild distal colonic thickening from the level of the descending to rectosigmoid with some faint pericolonic stranding and increased vascularity, suggestive of mild colitis, which may be infectious or inflammatory in etiology. 2. Postsurgical changes from right hemicolectomy with ileocecal anastomosis. Anastomotic line appears patent. 3. No evidence of bowel obstruction. 4. Prostatomegaly with indentation of the bladder base. Mild bladder wall thickening slightly greater than expected for underdistention with some faint perivesicular haze, could reflect cystitis or features of chronic outlet obstruction. Correlate with symptoms and urinalysis. 5. Hepatic steatosis. 6. Cholelithiasis without convincing CT evidence of acute cholecystitis. 7. Resolution of the previously seen complex left empyema. 8. Aortic Atherosclerosis  The patient was given 1 L bolus of IV lactated Ringer and 4 mg IV morphine sulfate as well as 4 mg of IV Zofran and 10 mEq IV potassium chloride.  He will be admitted to a medical bed for further evaluation and  management.  He was admitted for diagnosis of acute pancreatitis with likely associated colitis, hypokalemia, hypomagnesemia, alcohol and tobacco abuse.  He was admitted to a medical bed, placed on bowel rest/n.p.o. except for medications, started on IV fluids, placed on IV ceftriaxone and metronidazole, pain management, plans to replace potassium and magnesium, CIWA protocol and nicotine patch.  Early this morning I discussed with patient's RN.  He was on contact isolation because he had refused COVID-19 testing in the ED, subsequently agreed which had been sent and was pending.  He was reportedly being uncooperative with several aspects of care, as stated above initially refused COVID-19 testing, refused telemetry, all his medications, nursing assessment, did not want the HEPA filter on in his room while we were waiting for the COVID-19 testing.  He subsequently demanded nursing to remove his IV line to leave AMA.  I was informed after the patient had actually signed and left the hospital AMA.  As per my communication with RN, when she went to give him his medications, he refused them all and started complaining about how he was ready to leave and did not want to stay and be on Covid precautions.  They tried to explain that he would not be on them for too much longer, but he wanted IV removed and to go home.  As stated above, unfortunately I did not get a chance to evaluate him in person because he signed out and left AMA prior to being seen.  Thereby no formal discharge instructions or paperwork was completed.   Marcellus Scott, MD, [place], [facility]. [facility]  To contact the attending provider between 7A-7P or the covering provider during after hours 7P-7A, please log into the web site [email or web address]  and access using universal [place] password for that web site. If you do not have the password, please call the hospital operator.

## 2020-01-22 ENCOUNTER — Emergency Department: Payer: Medicaid Other

## 2020-01-22 ENCOUNTER — Emergency Department
Admission: EM | Admit: 2020-01-22 | Discharge: 2020-01-22 | Disposition: A | Discharge status: Home / Self Care | Payer: Medicaid Other | Attending: Emergency Medicine | Admitting: Emergency Medicine

## 2020-01-22 ENCOUNTER — Encounter: Payer: Self-pay | Admitting: Emergency Medicine

## 2020-01-22 ENCOUNTER — Other Ambulatory Visit: Payer: Self-pay

## 2020-01-22 DIAGNOSIS — M5410 Radiculopathy, site unspecified: Secondary | ICD-10-CM

## 2020-01-22 DIAGNOSIS — Z7982 Long term (current) use of aspirin: Secondary | ICD-10-CM | POA: Diagnosis not present

## 2020-01-22 DIAGNOSIS — R059 Cough, unspecified: Secondary | ICD-10-CM | POA: Diagnosis present

## 2020-01-22 DIAGNOSIS — M5416 Radiculopathy, lumbar region: Secondary | ICD-10-CM | POA: Diagnosis not present

## 2020-01-22 DIAGNOSIS — F17210 Nicotine dependence, cigarettes, uncomplicated: Secondary | ICD-10-CM | POA: Diagnosis not present

## 2020-01-22 DIAGNOSIS — U071 COVID-19: Secondary | ICD-10-CM | POA: Insufficient documentation

## 2020-01-22 DIAGNOSIS — J40 Bronchitis, not specified as acute or chronic: Secondary | ICD-10-CM | POA: Diagnosis not present

## 2020-01-22 LAB — CBC
HCT: 45.8 % (ref 39.0–52.0)
Hemoglobin: 15.3 g/dL (ref 13.0–17.0)
MCH: 31.7 pg (ref 26.0–34.0)
MCHC: 33.4 g/dL (ref 30.0–36.0)
MCV: 94.8 fL (ref 80.0–100.0)
Platelets: 152 10*3/uL (ref 150–400)
RBC: 4.83 MIL/uL (ref 4.22–5.81)
RDW: 13.8 % (ref 11.5–15.5)
WBC: 6.6 10*3/uL (ref 4.0–10.5)
nRBC: 0 % (ref 0.0–0.2)

## 2020-01-22 LAB — BASIC METABOLIC PANEL
Anion gap: 13 (ref 5–15)
BUN: <5 mg/dL — ABNORMAL LOW (ref 8–23)
CO2: 24 mmol/L (ref 22–32)
Calcium: 8.3 mg/dL — ABNORMAL LOW (ref 8.9–10.3)
Chloride: 99 mmol/L (ref 98–111)
Creatinine, Ser: 0.74 mg/dL (ref 0.61–1.24)
GFR, Estimated: >60 mL/min (ref >60)
Glucose, Bld: 142 mg/dL — ABNORMAL HIGH (ref 70–99)
Potassium: 2.9 mmol/L — ABNORMAL LOW (ref 3.5–5.1)
Sodium: 136 mmol/L (ref 135–145)

## 2020-01-22 LAB — TROPONIN I (HIGH SENSITIVITY)
Troponin I (High Sensitivity): 18 ng/L — ABNORMAL HIGH (ref <18)
Troponin I (High Sensitivity): 8 ng/L (ref <18)

## 2020-01-22 MED ORDER — HYDROCODONE-ACETAMINOPHEN 5-325 MG PO TABS: 1.0000 | ORAL_TABLET | Freq: Four times a day (QID) | ORAL | 0 refills | Status: AC | PRN

## 2020-01-22 MED ORDER — ALBUTEROL SULFATE HFA 108 (90 BASE) MCG/ACT IN AERS
2.0000 | INHALATION_SPRAY | RESPIRATORY_TRACT | 0 refills | Status: DC | PRN
Start: 2020-01-22 — End: 2020-07-06

## 2020-01-22 MED ORDER — PREDNISONE 20 MG PO TABS: 60.0000 mg | ORAL_TABLET | Freq: Every day | ORAL | 0 refills | Status: AC

## 2020-01-22 MED ORDER — AZITHROMYCIN 250 MG PO TABS: ORAL_TABLET | ORAL | 0 refills | Status: AC

## 2020-01-22 MED ORDER — POTASSIUM CHLORIDE ER 10 MEQ PO TBCR: 10.0000 meq | EXTENDED_RELEASE_TABLET | Freq: Two times a day (BID) | ORAL | 0 refills | Status: DC

## 2020-01-22 NOTE — ED Triage Notes (Signed)
Pt to ED via POV with c/o L arm numbness/tingling, cough, and SOB. Pt states symptoms x 1 week. Pt A&O x4, NAD noted in triage.

## 2020-01-22 NOTE — ED Provider Notes (Signed)
[facility] Emergency Department Provider Note ____________________________________________   Event Date/Time   First MD Initiated Contact with Patient 01/22/20 1610     (approximate)  I have reviewed the triage vital signs and the nursing notes.   HISTORY  Chief Complaint Numbness, Cough (S), and Shortness of Breath    HPI Thomas Coffey is a 65 y.o. male with PMH as noted below including atrial fibrillation and CAD who presents with cough over the last week, productive of green sputum, persistent course, and associated with some intermittent shortness of breath and chest tightness.  The patient denies any active chest pain.  He reports some generalized body aches and sometimes some intermittent epigastric abdominal pain.  He has some chills but no vomiting or diarrhea.  He does not have any known sick contacts.  Patient also reports left arm tingling and intermittent numbness over the last week, although he has no weakness.  He states that he does have some pain in his left neck at times.  He denies any trauma.  Past Medical History:  Diagnosis Date  . MI (myocardial infarction) [facility])     Patient Active Problem List   Diagnosis Date Noted  . Acute pancreatitis 07/20/2019  . Hypomagnesemia 11/24/2018  . Protein-calorie malnutrition, severe 11/23/2018  . Pleural effusion on left   . Liver function test abnormality   . AKI (acute kidney injury) (HCC)   . Atrial fibrillation with rapid ventricular response (HCC)   . Acute hepatitis 11/17/2018  . Atrial fibrillation with RVR (HCC) 11/17/2018  . Alcohol abuse 11/17/2018  . Tobacco dependence syndrome 11/17/2018  . Empyema [facility])     History reviewed. No pertinent surgical history.  Prior to Admission medications   Medication Sig Start Date End Date Taking? Authorizing Provider  albuterol (VENTOLIN HFA) 108 (90 Base) MCG/ACT inhaler Inhale 2 puffs into the lungs every 4 (four) hours as needed for  wheezing or shortness of breath. 01/22/20  Yes Dionne Bucy, MD  azithromycin (ZITHROMAX Z-PAK) 250 MG tablet Take 2 tablets (500 mg) on  Day 1,  followed by 1 tablet (250 mg) once daily on Days 2 through 5. 01/22/20 01/27/20 Yes Dionne Bucy, MD  HYDROcodone-acetaminophen (NORCO/VICODIN) 5-325 MG tablet Take 1 tablet by mouth every 6 (six) hours as needed for up to 5 days for severe pain. 01/22/20 01/27/20 Yes Dionne Bucy, MD  potassium chloride (KLOR-CON) 10 MEQ tablet Take 1 tablet (10 mEq total) by mouth 2 (two) times daily for 7 days. 01/22/20 01/29/20 Yes Dionne Bucy, MD  predniSONE (DELTASONE) 20 MG tablet Take 3 tablets (60 mg total) by mouth daily for 5 days. 01/22/20 01/27/20 Yes Dionne Bucy, MD  acetaminophen (TYLENOL) 325 MG tablet Take 2 tablets (650 mg total) by mouth every 6 (six) hours as needed for mild pain or fever. 11/25/18   Lorretta Harp, MD  amoxicillin-clavulanate (AUGMENTIN) 875-125 MG tablet Take 1 tablet by mouth every 12 (twelve) hours. 11/25/18   Lorretta Harp, MD  aspirin EC 81 MG EC tablet Take 1 tablet (81 mg total) by mouth daily. 11/26/18   Lorretta Harp, MD  folic acid (FOLVITE) 1 MG tablet Take 1 tablet (1 mg total) by mouth daily. 11/26/18   Lorretta Harp, MD  Multiple Vitamin (MULTIVITAMIN WITH MINERALS) TABS tablet Take 1 tablet by mouth daily. 11/26/18   Lorretta Harp, MD  nicotine (NICODERM CQ - DOSED IN MG/24 HOURS) 21 mg/24hr patch Place 1 patch (21 mg total) onto the skin daily. 11/26/18  Lorretta Harp, MD  ondansetron [facility]) 4 MG tablet Take 1 tablet (4 mg total) by mouth daily as needed for nausea or vomiting. 05/13/19   Minna Antis, MD  oxyCODONE-acetaminophen (PERCOCET) 5-325 MG tablet Take 1 tablet by mouth every 4 (four) hours as needed for severe pain. 05/13/19   Minna Antis, MD  thiamine 100 MG tablet Take 1 tablet (100 mg total) by mouth daily. 11/26/18   Lorretta Harp, MD    Allergies Patient has no known allergies.  Family  History  Problem Relation Age of Onset  . Cancer Mother   . Kidney disease Brother     Social History Social History   Tobacco Use  . Smoking status: Current Every Day Smoker    Packs/day: 1.00    Years: 30.00    Pack years: 30.00    Types: Cigarettes  . Smokeless tobacco: Never Used  Substance Use Topics  . Alcohol use: Yes    Alcohol/week: 6.0 standard drinks    Types: 6 Cans of beer per week    Comment: daily  . Drug use: Yes    Types: Marijuana    Review of Systems  Constitutional: No fever. Eyes: No redness. ENT: No sore throat. Cardiovascular: Denies chest pain. Respiratory: Positive for intermittent shortness of breath. Gastrointestinal: No vomiting or diarrhea.  Genitourinary: Negative for dysuria.  Musculoskeletal: Negative for back pain. Skin: Negative for rash. Neurological: Negative for headaches or focal weakness.  Positive for left arm tingling.   ____________________________________________   PHYSICAL EXAM:  VITAL SIGNS: ED Triage Vitals  Enc Vitals Group     BP 01/22/20 0958 129/74     Pulse Rate 01/22/20 0958 (!) 106     Resp 01/22/20 0958 20     Temp 01/22/20 0958 97.7 F (36.5 C)     Temp Source 01/22/20 0958 Oral     SpO2 01/22/20 0958 100 %     Weight 01/22/20 0957 160 lb (72.6 kg)     Height 01/22/20 0957 5\' 9"  (1.753 m)     Head Circumference --      Peak Flow --      Pain Score 01/22/20 0957 6     Pain Loc --      Pain Edu? --      Excl. in GC? --     Constitutional: Alert and oriented. Well appearing and in no acute distress. Eyes: Conjunctivae are normal.  Head: Atraumatic. Nose: No congestion/rhinnorhea. Mouth/Throat: Mucous membranes are moist.   Neck: Normal range of motion.  Cardiovascular: Borderline tachycardic, regular rhythm. Grossly normal heart sounds.  Good peripheral circulation. Respiratory: Normal respiratory effort.  No retractions.  Coarse breath sounds bilaterally with no wheezes or  rales. Gastrointestinal: Soft and nontender. No distention.  Genitourinary: No flank tenderness. Musculoskeletal: No lower extremity edema.  Extremities warm and well perfused.  Neurologic:  Normal speech and language.  5/5 motor strength and intact sensation of bilateral upper and lower extremities.  No focal sensory deficits.  Normal gait.  Normal coordination. Skin:  Skin is warm and dry. No rash noted. Psychiatric: Mood and affect are normal. Speech and behavior are normal.  ____________________________________________   LABS (all labs ordered are listed, but only abnormal results are displayed)  Labs Reviewed  BASIC METABOLIC PANEL - Abnormal; Notable for the following components:      Result Value   Potassium 2.9 (*)    Glucose, Bld 142 (*)    BUN <5 (*)    Calcium 8.3 (*)  All other components within normal limits  TROPONIN I (HIGH SENSITIVITY) - Abnormal; Notable for the following components:   Troponin I (High Sensitivity) 18 (*)    All other components within normal limits  SARS CORONAVIRUS 2 (TAT 6-24 HRS)  CBC  TROPONIN I (HIGH SENSITIVITY)   ____________________________________________  EKG  ED ECG REPORT I, Arta Silence, the attending physician, personally viewed and interpreted this ECG.  Date: 01/22/2020 EKG Time: 0953 Rate: 99 Rhythm: normal sinus rhythm QRS Axis: Left axis Intervals: normal ST/T Wave abnormalities: normal Narrative Interpretation: no evidence of acute ischemia; no significant change when compared to EKG of 07/19/2019  ____________________________________________  RADIOLOGY  CXR interpreted by me shows no focal infiltrate or edema  ____________________________________________   PROCEDURES  Procedure(s) performed: No  Procedures  Critical Care performed: No ____________________________________________   INITIAL IMPRESSION / ASSESSMENT AND PLAN / ED COURSE  Pertinent labs & imaging results that were available  during my care of the patient were reviewed by me and considered in my medical decision making (see chart for details).  65 year old male with PMH as noted above presents with productive cough, intermittent shortness of breath, body aches, and slight malaise over the last week.  I reviewed the past medical records in [place].  The patient was most recently admitted in July with acute pancreatitis, colitis and electrolyte abnormalities in the context of alcohol abuse.  On exam today the patient is overall well-appearing.  His vital signs are normal except for borderline tachycardia.  It appears that on prior ED visits he has been similarly tachycardic between 100-110.  O2 saturation is in the high 90s on room air and he has no increased work of breathing or respiratory distress.  There are coarse breath sounds bilaterally.  He has no peripheral edema.  Abdomen soft with no focal tenderness.  Of note, the patient waited for over 6 hours prior to being placed in an exam area to be seen.  Basic labs were obtained from triage and are unremarkable except for slight hypokalemia which the patient has had previously.  He has no leukocytosis.  Initial troponin was slightly indeterminate but the repeat is negative.  EKG shows no ischemic changes and the chest x-ray is clear.  Given the productive cough, coarse breath sounds, clear x-ray, presentation is most consistent with acute bronchitis.  Differential also includes COVID-19.  There is no evidence of pneumonia.  There is also no evidence of cardiac cause.  Although the patient is slightly tachycardic, there is no clinical evidence for PE.  He has no leg swelling, hypoxia, chest pain, or other findings that would suggest VTE.  He does report some body aches and some upper abdominal discomfort but no significant abdominal pain to suggest pancreatitis or other acute intra-abdominal process.  He has no vomiting  The patient overall states he feels well.  He states  he wanted to "just get checked out" and wants to go home.  At this time given the reassuring work-up, he is appropriate for discharge home.  I counseled him on the results of the work-up.  We will send a COVID swab.  I have prescribed prednisone, albuterol, and azithromycin.  I have also prescribed potassium for repletion.  The patient states that he has been taking over-the-counter medications for both his body aches and chronic pain request some additional pain medication.  He has been on hydrocodone in the past I have prescribed a short course of this.  Return precautions given, and the patient  expressed understanding.  ____________________________________________   FINAL CLINICAL IMPRESSION(S) / ED DIAGNOSES  Final diagnoses:  Bronchitis  Radiculopathy, unspecified spinal region      NEW MEDICATIONS STARTED DURING THIS VISIT:  Discharge Medication List as of 01/22/2020  5:16 PM    START taking these medications   Details  albuterol (VENTOLIN HFA) 108 (90 Base) MCG/ACT inhaler Inhale 2 puffs into the lungs every 4 (four) hours as needed for wheezing or shortness of breath., Starting Thu 01/22/2020, Normal    azithromycin (ZITHROMAX Z-PAK) 250 MG tablet Take 2 tablets (500 mg) on  Day 1,  followed by 1 tablet (250 mg) once daily on Days 2 through 5., Normal    potassium chloride (KLOR-CON) 10 MEQ tablet Take 1 tablet (10 mEq total) by mouth 2 (two) times daily for 7 days., Starting Thu 01/22/2020, Until Thu 01/29/2020, Normal    predniSONE (DELTASONE) 20 MG tablet Take 3 tablets (60 mg total) by mouth daily for 5 days., Starting Thu 01/22/2020, Until Tue 01/27/2020, Normal         Note:  This document was prepared using Dragon voice recognition software and may include unintentional dictation errors.   Dionne Bucy, MD 01/22/20 [phone]

## 2020-01-22 NOTE — Discharge Instructions (Signed)
The antibiotic (azithromycin) as prescribed and finish the full 5-day course.  You should also take the prednisone for a 5-day course.  You may use the albuterol inhaler as needed for shortness of breath or chest tightness.  Your potassium level is slightly low again, so we have prescribed oral potassium to take for the next week.  Follow-up with your regular doctor in the next few weeks.  We have sent a COVID swab.  This should result tomorrow and he will get called with the result.  Return to the ER for new, worsening, or persistent severe shortness of breath, chest pain or tightness, fever, weakness, vomiting, worsening numbness or weakness in the arm, or any other new or worsening symptoms that concern you.

## 2020-01-23 LAB — SARS CORONAVIRUS 2 (TAT 6-24 HRS): SARS Coronavirus 2: POSITIVE — AB

## 2020-01-25 ENCOUNTER — Telehealth: Payer: Self-pay | Admitting: Emergency Medicine

## 2020-01-25 NOTE — Telephone Encounter (Signed)
Called patient to inform of positive covid result.  He was not aware and states that he has other people living with him including small children.  I explained isolation in the home and the need for the others in the home to quarantine due to exposure to him.

## 2020-01-28 ENCOUNTER — Encounter: Payer: Self-pay | Admitting: Emergency Medicine

## 2020-01-28 ENCOUNTER — Emergency Department: Payer: Medicaid Other

## 2020-01-28 ENCOUNTER — Inpatient Hospital Stay
Admission: EM | Admit: 2020-01-28 | Discharge: 2020-02-26 | DRG: 177 | Disposition: A | Discharge status: Home Health | Payer: Medicaid Other | Attending: Internal Medicine | Admitting: Internal Medicine

## 2020-01-28 ENCOUNTER — Other Ambulatory Visit: Payer: Self-pay

## 2020-01-28 DIAGNOSIS — K760 Fatty (change of) liver, not elsewhere classified: Secondary | ICD-10-CM | POA: Diagnosis present

## 2020-01-28 DIAGNOSIS — Z79899 Other long term (current) drug therapy: Secondary | ICD-10-CM

## 2020-01-28 DIAGNOSIS — J208 Acute bronchitis due to other specified organisms: Secondary | ICD-10-CM | POA: Diagnosis present

## 2020-01-28 DIAGNOSIS — E876 Hypokalemia: Secondary | ICD-10-CM | POA: Diagnosis present

## 2020-01-28 DIAGNOSIS — I482 Chronic atrial fibrillation, unspecified: Secondary | ICD-10-CM | POA: Diagnosis present

## 2020-01-28 DIAGNOSIS — U071 COVID-19: Principal | ICD-10-CM | POA: Diagnosis present

## 2020-01-28 DIAGNOSIS — J690 Pneumonitis due to inhalation of food and vomit: Secondary | ICD-10-CM | POA: Diagnosis not present

## 2020-01-28 DIAGNOSIS — K852 Alcohol induced acute pancreatitis without necrosis or infection: Secondary | ICD-10-CM | POA: Diagnosis present

## 2020-01-28 DIAGNOSIS — F17210 Nicotine dependence, cigarettes, uncomplicated: Secondary | ICD-10-CM | POA: Diagnosis present

## 2020-01-28 DIAGNOSIS — J069 Acute upper respiratory infection, unspecified: Secondary | ICD-10-CM | POA: Diagnosis present

## 2020-01-28 DIAGNOSIS — I2510 Atherosclerotic heart disease of native coronary artery without angina pectoris: Secondary | ICD-10-CM | POA: Diagnosis present

## 2020-01-28 DIAGNOSIS — Z7982 Long term (current) use of aspirin: Secondary | ICD-10-CM

## 2020-01-28 DIAGNOSIS — K8500 Idiopathic acute pancreatitis without necrosis or infection: Secondary | ICD-10-CM

## 2020-01-28 DIAGNOSIS — R7401 Elevation of levels of liver transaminase levels: Secondary | ICD-10-CM

## 2020-01-28 DIAGNOSIS — F10230 Alcohol dependence with withdrawal, uncomplicated: Secondary | ICD-10-CM | POA: Diagnosis not present

## 2020-01-28 DIAGNOSIS — F1010 Alcohol abuse, uncomplicated: Secondary | ICD-10-CM | POA: Diagnosis present

## 2020-01-28 DIAGNOSIS — Z6823 Body mass index (BMI) 23.0-23.9, adult: Secondary | ICD-10-CM

## 2020-01-28 DIAGNOSIS — G9341 Metabolic encephalopathy: Secondary | ICD-10-CM | POA: Diagnosis not present

## 2020-01-28 DIAGNOSIS — K859 Acute pancreatitis without necrosis or infection, unspecified: Secondary | ICD-10-CM | POA: Diagnosis present

## 2020-01-28 DIAGNOSIS — R7989 Other specified abnormal findings of blood chemistry: Secondary | ICD-10-CM | POA: Diagnosis present

## 2020-01-28 DIAGNOSIS — Z841 Family history of disorders of kidney and ureter: Secondary | ICD-10-CM

## 2020-01-28 DIAGNOSIS — Z720 Tobacco use: Secondary | ICD-10-CM | POA: Diagnosis present

## 2020-01-28 DIAGNOSIS — I252 Old myocardial infarction: Secondary | ICD-10-CM

## 2020-01-28 DIAGNOSIS — R945 Abnormal results of liver function studies: Secondary | ICD-10-CM

## 2020-01-28 DIAGNOSIS — E162 Hypoglycemia, unspecified: Secondary | ICD-10-CM | POA: Diagnosis not present

## 2020-01-28 DIAGNOSIS — I10 Essential (primary) hypertension: Secondary | ICD-10-CM | POA: Diagnosis present

## 2020-01-28 DIAGNOSIS — R627 Adult failure to thrive: Secondary | ICD-10-CM | POA: Diagnosis not present

## 2020-01-28 DIAGNOSIS — E43 Unspecified severe protein-calorie malnutrition: Secondary | ICD-10-CM | POA: Diagnosis not present

## 2020-01-28 DIAGNOSIS — K8020 Calculus of gallbladder without cholecystitis without obstruction: Secondary | ICD-10-CM | POA: Diagnosis present

## 2020-01-28 DIAGNOSIS — Z809 Family history of malignant neoplasm, unspecified: Secondary | ICD-10-CM

## 2020-01-28 LAB — PROCALCITONIN: Procalcitonin: 0.11 ng/mL

## 2020-01-28 LAB — COMPREHENSIVE METABOLIC PANEL
ALT: 37 U/L (ref 0–44)
AST: 146 U/L — ABNORMAL HIGH (ref 15–41)
Albumin: 3 g/dL — ABNORMAL LOW (ref 3.5–5.0)
Alkaline Phosphatase: 213 U/L — ABNORMAL HIGH (ref 38–126)
Anion gap: 14 (ref 5–15)
BUN: 7 mg/dL — ABNORMAL LOW (ref 8–23)
CO2: 24 mmol/L (ref 22–32)
Calcium: 9.2 mg/dL (ref 8.9–10.3)
Chloride: 100 mmol/L (ref 98–111)
Creatinine, Ser: 0.62 mg/dL (ref 0.61–1.24)
GFR, Estimated: >60 mL/min (ref >60)
Glucose, Bld: 112 mg/dL — ABNORMAL HIGH (ref 70–99)
Potassium: 3.4 mmol/L — ABNORMAL LOW (ref 3.5–5.1)
Sodium: 138 mmol/L (ref 135–145)
Total Bilirubin: 1.8 mg/dL — ABNORMAL HIGH (ref 0.3–1.2)
Total Protein: 7.3 g/dL (ref 6.5–8.1)

## 2020-01-28 LAB — C-REACTIVE PROTEIN: CRP: 2.7 mg/dL — ABNORMAL HIGH (ref <1.0)

## 2020-01-28 LAB — CBC WITH DIFFERENTIAL/PLATELET
Abs Immature Granulocytes: 0.06 10*3/uL (ref 0.00–0.07)
Basophils Absolute: 0 10*3/uL (ref 0.0–0.1)
Basophils Relative: 0 %
Eosinophils Absolute: 0 10*3/uL (ref 0.0–0.5)
Eosinophils Relative: 0 %
HCT: 48.5 % (ref 39.0–52.0)
Hemoglobin: 16.4 g/dL (ref 13.0–17.0)
Immature Granulocytes: 1 %
Lymphocytes Relative: 8 %
Lymphs Abs: 0.8 10*3/uL (ref 0.7–4.0)
MCH: 31.4 pg (ref 26.0–34.0)
MCHC: 33.8 g/dL (ref 30.0–36.0)
MCV: 92.7 fL (ref 80.0–100.0)
Monocytes Absolute: 0.9 10*3/uL (ref 0.1–1.0)
Monocytes Relative: 9 %
Neutro Abs: 9 10*3/uL — ABNORMAL HIGH (ref 1.7–7.7)
Neutrophils Relative %: 82 %
Platelets: 183 10*3/uL (ref 150–400)
RBC: 5.23 MIL/uL (ref 4.22–5.81)
RDW: 13.5 % (ref 11.5–15.5)
WBC: 10.8 10*3/uL — ABNORMAL HIGH (ref 4.0–10.5)
nRBC: 0 % (ref 0.0–0.2)

## 2020-01-28 LAB — HEPATIC FUNCTION PANEL
ALT: 39 U/L (ref 0–44)
AST: 153 U/L — ABNORMAL HIGH (ref 15–41)
Albumin: 3.1 g/dL — ABNORMAL LOW (ref 3.5–5.0)
Alkaline Phosphatase: 215 U/L — ABNORMAL HIGH (ref 38–126)
Bilirubin, Direct: 0.7 mg/dL — ABNORMAL HIGH (ref 0.0–0.2)
Indirect Bilirubin: 1 mg/dL — ABNORMAL HIGH (ref 0.3–0.9)
Total Bilirubin: 1.7 mg/dL — ABNORMAL HIGH (ref 0.3–1.2)
Total Protein: 7.4 g/dL (ref 6.5–8.1)

## 2020-01-28 LAB — FIBRINOGEN: Fibrinogen: 303 mg/dL (ref 210–475)

## 2020-01-28 LAB — FIBRIN DERIVATIVES D-DIMER (ARMC ONLY): Fibrin derivatives D-dimer (ARMC): 2957.61 ng/mL (FEU) — ABNORMAL HIGH (ref 0.00–499.00)

## 2020-01-28 LAB — LIPASE, BLOOD: Lipase: 518 U/L — ABNORMAL HIGH (ref 11–51)

## 2020-01-28 LAB — FERRITIN: Ferritin: 466 ng/mL — ABNORMAL HIGH (ref 24–336)

## 2020-01-28 LAB — TROPONIN I (HIGH SENSITIVITY): Troponin I (High Sensitivity): 9 ng/L (ref <18)

## 2020-01-28 LAB — LACTATE DEHYDROGENASE: LDH: 157 U/L (ref 98–192)

## 2020-01-28 LAB — HIV ANTIBODY (ROUTINE TESTING W REFLEX): HIV Screen 4th Generation wRfx: NONREACTIVE

## 2020-01-28 LAB — MAGNESIUM: Magnesium: 1.6 mg/dL — ABNORMAL LOW (ref 1.7–2.4)

## 2020-01-28 LAB — HEPATITIS B SURFACE ANTIGEN: Hepatitis B Surface Ag: NONREACTIVE

## 2020-01-28 LAB — TRIGLYCERIDES: Triglycerides: 84 mg/dL (ref <150)

## 2020-01-28 LAB — BRAIN NATRIURETIC PEPTIDE: B Natriuretic Peptide: 117.5 pg/mL — ABNORMAL HIGH (ref 0.0–100.0)

## 2020-01-28 LAB — ETHANOL: Alcohol, Ethyl (B): <10 mg/dL (ref <10)

## 2020-01-28 MED ORDER — ENOXAPARIN SODIUM 40 MG/0.4ML ~~LOC~~ SOLN
40.0000 mg | SUBCUTANEOUS | Status: DC
  Administered 2020-01-28 – 2020-02-25 (×25): 40 mg via SUBCUTANEOUS
  Filled 2020-01-28 (×28): qty 0.4

## 2020-01-28 MED ORDER — LORAZEPAM 2 MG/ML IJ SOLN
1.0000 mg | INTRAMUSCULAR | Status: AC | PRN
Start: 2020-01-28 — End: 2020-01-31
  Administered 2020-01-29: 22:00:00 2 mg via INTRAVENOUS

## 2020-01-28 MED ORDER — MAGNESIUM SULFATE 2 GM/50ML IV SOLN
2.0000 g | Freq: Once | INTRAVENOUS | Status: AC
  Administered 2020-01-28: 2 g via INTRAVENOUS
  Filled 2020-01-28: qty 50

## 2020-01-28 MED ORDER — SODIUM CHLORIDE 0.9 % IV SOLN: INTRAVENOUS | Status: DC

## 2020-01-28 MED ORDER — LORAZEPAM 1 MG PO TABS
1.0000 mg | ORAL_TABLET | ORAL | Status: AC | PRN
  Administered 2020-01-30 – 2020-01-31 (×5): 4 mg via ORAL
  Filled 2020-01-28 (×6): qty 4

## 2020-01-28 MED ORDER — IPRATROPIUM BROMIDE HFA 17 MCG/ACT IN AERS
2.0000 | INHALATION_SPRAY | Freq: Four times a day (QID) | RESPIRATORY_TRACT | Status: DC
  Administered 2020-01-28 – 2020-02-03 (×15): 2 via RESPIRATORY_TRACT
  Filled 2020-01-28: qty 12.9

## 2020-01-28 MED ORDER — ZINC SULFATE 220 (50 ZN) MG PO CAPS
220.0000 mg | ORAL_CAPSULE | Freq: Every day | ORAL | Status: DC
  Administered 2020-01-28 – 2020-02-26 (×22): 220 mg via ORAL
  Filled 2020-01-28 (×28): qty 1

## 2020-01-28 MED ORDER — HYDRALAZINE HCL 20 MG/ML IJ SOLN
5.0000 mg | INTRAMUSCULAR | Status: DC | PRN
  Administered 2020-01-28: 5 mg via INTRAVENOUS
  Filled 2020-01-28: qty 1

## 2020-01-28 MED ORDER — FOLIC ACID 1 MG PO TABS
1.0000 mg | ORAL_TABLET | Freq: Every day | ORAL | Status: DC
  Administered 2020-01-28 – 2020-02-03 (×4): 1 mg via ORAL
  Filled 2020-01-28 (×6): qty 1

## 2020-01-28 MED ORDER — LORAZEPAM 2 MG/ML IJ SOLN: 0.0000 mg | Freq: Two times a day (BID) | INTRAMUSCULAR | Status: DC

## 2020-01-28 MED ORDER — NICOTINE 21 MG/24HR TD PT24: 21.0000 mg | MEDICATED_PATCH | Freq: Every day | TRANSDERMAL | Status: DC

## 2020-01-28 MED ORDER — ASPIRIN EC 81 MG PO TBEC
81.0000 mg | DELAYED_RELEASE_TABLET | Freq: Every day | ORAL | Status: DC
Start: 2020-01-28 — End: 2020-02-26
  Administered 2020-01-28 – 2020-02-26 (×25): 81 mg via ORAL
  Filled 2020-01-28 (×27): qty 1

## 2020-01-28 MED ORDER — SODIUM CHLORIDE 0.9 % IV BOLUS
1000.0000 mL | Freq: Once | INTRAVENOUS | Status: AC
  Administered 2020-01-28: 1000 mL via INTRAVENOUS

## 2020-01-28 MED ORDER — POTASSIUM CHLORIDE CRYS ER 20 MEQ PO TBCR
40.0000 meq | EXTENDED_RELEASE_TABLET | Freq: Once | ORAL | Status: AC
  Administered 2020-01-28: 40 meq via ORAL
  Filled 2020-01-28: qty 2

## 2020-01-28 MED ORDER — DM-GUAIFENESIN ER 30-600 MG PO TB12
1.0000 | ORAL_TABLET | Freq: Two times a day (BID) | ORAL | Status: DC | PRN
  Administered 2020-01-29: 1 via ORAL
  Filled 2020-01-28: qty 1

## 2020-01-28 MED ORDER — ASCORBIC ACID 500 MG PO TABS
500.0000 mg | ORAL_TABLET | Freq: Every day | ORAL | Status: DC
  Administered 2020-01-28 – 2020-02-26 (×21): 500 mg via ORAL
  Filled 2020-01-28 (×25): qty 1

## 2020-01-28 MED ORDER — METHYLPREDNISOLONE SODIUM SUCC 40 MG IJ SOLR
40.0000 mg | Freq: Two times a day (BID) | INTRAMUSCULAR | Status: DC
  Administered 2020-01-28 – 2020-01-29 (×3): 40 mg via INTRAVENOUS
  Filled 2020-01-28 (×3): qty 1

## 2020-01-28 MED ORDER — OXYCODONE HCL 5 MG PO TABS
5.0000 mg | ORAL_TABLET | ORAL | Status: DC | PRN
  Administered 2020-01-30 – 2020-02-01 (×7): 5 mg via ORAL
  Filled 2020-01-28 (×7): qty 1

## 2020-01-28 MED ORDER — LORAZEPAM 2 MG/ML IJ SOLN
0.0000 mg | Freq: Four times a day (QID) | INTRAMUSCULAR | Status: DC
  Administered 2020-01-28 – 2020-01-29 (×3): 1 mg via INTRAVENOUS
  Administered 2020-01-29: 2 mg via INTRAVENOUS
  Filled 2020-01-28 (×3): qty 1
  Filled 2020-01-28: qty 2
  Filled 2020-01-28: qty 1

## 2020-01-28 MED ORDER — ACETAMINOPHEN 325 MG PO TABS: 650.0000 mg | ORAL_TABLET | Freq: Four times a day (QID) | ORAL | Status: DC | PRN

## 2020-01-28 MED ORDER — ALBUTEROL SULFATE HFA 108 (90 BASE) MCG/ACT IN AERS
2.0000 | INHALATION_SPRAY | RESPIRATORY_TRACT | Status: DC | PRN
  Filled 2020-01-28: qty 6.7

## 2020-01-28 MED ORDER — FENTANYL CITRATE (PF) 100 MCG/2ML IJ SOLN
100.0000 ug | Freq: Once | INTRAMUSCULAR | Status: AC
  Administered 2020-01-28: 100 ug via INTRAVENOUS
  Filled 2020-01-28: qty 2

## 2020-01-28 MED ORDER — SODIUM CHLORIDE 0.9 % IV SOLN
100.0000 mg | Freq: Every day | INTRAVENOUS | Status: AC
  Administered 2020-01-29 – 2020-02-01 (×3): 100 mg via INTRAVENOUS
  Filled 2020-01-28 (×3): qty 20

## 2020-01-28 MED ORDER — MORPHINE SULFATE (PF) 2 MG/ML IV SOLN
2.0000 mg | INTRAVENOUS | Status: DC | PRN
  Administered 2020-01-28 – 2020-02-05 (×7): 2 mg via INTRAVENOUS
  Filled 2020-01-28 (×8): qty 1

## 2020-01-28 MED ORDER — ONDANSETRON HCL 4 MG/2ML IJ SOLN
4.0000 mg | Freq: Once | INTRAMUSCULAR | Status: AC
  Administered 2020-01-28: 4 mg via INTRAVENOUS
  Filled 2020-01-28: qty 2

## 2020-01-28 MED ORDER — NICOTINE 21 MG/24HR TD PT24
21.0000 mg | MEDICATED_PATCH | Freq: Every day | TRANSDERMAL | Status: DC
  Administered 2020-01-28 – 2020-02-26 (×27): 21 mg via TRANSDERMAL
  Filled 2020-01-28 (×27): qty 1

## 2020-01-28 MED ORDER — THIAMINE HCL 100 MG/ML IJ SOLN
100.0000 mg | Freq: Every day | INTRAMUSCULAR | Status: DC
  Filled 2020-01-28 (×2): qty 2

## 2020-01-28 MED ORDER — AZITHROMYCIN 500 MG PO TABS: 250.0000 mg | ORAL_TABLET | Freq: Every day | ORAL | Status: DC

## 2020-01-28 MED ORDER — ADULT MULTIVITAMIN W/MINERALS CH
1.0000 | ORAL_TABLET | Freq: Every day | ORAL | Status: DC
  Administered 2020-01-29 – 2020-02-26 (×19): 1 via ORAL
  Filled 2020-01-28 (×22): qty 1

## 2020-01-28 MED ORDER — AZITHROMYCIN 500 MG PO TABS: 500.0000 mg | ORAL_TABLET | Freq: Every day | ORAL | Status: DC

## 2020-01-28 MED ORDER — SODIUM CHLORIDE 0.9 % IV SOLN
200.0000 mg | Freq: Once | INTRAVENOUS | Status: AC
  Administered 2020-01-28: 200 mg via INTRAVENOUS
  Filled 2020-01-28: qty 200

## 2020-01-28 MED ORDER — ONDANSETRON HCL 4 MG/2ML IJ SOLN: 4.0000 mg | Freq: Three times a day (TID) | INTRAMUSCULAR | Status: DC | PRN

## 2020-01-28 MED ORDER — DOXYCYCLINE HYCLATE 100 MG PO TABS
100.0000 mg | ORAL_TABLET | Freq: Two times a day (BID) | ORAL | Status: DC
Start: 2020-01-28 — End: 2020-02-01
  Administered 2020-01-28 – 2020-01-31 (×7): 100 mg via ORAL
  Filled 2020-01-28 (×8): qty 1

## 2020-01-28 MED ORDER — THIAMINE HCL 100 MG PO TABS
100.0000 mg | ORAL_TABLET | Freq: Every day | ORAL | Status: DC
  Administered 2020-01-28 – 2020-01-31 (×4): 100 mg via ORAL
  Filled 2020-01-28 (×5): qty 1

## 2020-01-28 NOTE — ED Notes (Signed)
Says he is here for abdominal pain starting yesterday and some vomiting and says it is his pancreatitis.  Still has cough.   His abdomen is soft and says hurts all over and in lower back.

## 2020-01-28 NOTE — ED Notes (Signed)
Lab contacted to collect labs, this RN multiple attempts

## 2020-01-28 NOTE — ED Notes (Signed)
Dr Clyde Lundborg messaged to notify of BP 183/100, orders placed

## 2020-01-28 NOTE — ED Triage Notes (Addendum)
Patient ambulatory to triage with steady gait, without difficulty or distress noted; pt reports body aches, prod cough green sputum and congestion since last wk; st was seen for same and rx meds without relief; + smoker; pt st he does not know what dx he was given; results indicate +COVID when seen here 1/6 but pt reports he was not aware of such

## 2020-01-28 NOTE — ED Provider Notes (Signed)
[facility] Emergency Department Provider Note  ____________________________________________  Time seen: Approximately 7:07 AM  I have reviewed the triage vital signs and the nursing notes.   HISTORY  Chief Complaint Generalized Body Aches and Cough   HPI Thomas Coffey is a 65 y.o. male with a history of MI presents to the emergency department for treatment and evaluation of abdominal pain and vomiting that started yesterday. He is also complaining of cough. He denies fever, shortness of breath, or diarrhea.      Past Medical History:  Diagnosis Date  . MI (myocardial infarction) [facility])     Patient Active Problem List   Diagnosis Date Noted  . COVID-19 virus infection 01/28/2020  . Abnormal LFTs 01/28/2020  . CAD (coronary artery disease) 01/28/2020  . Atrial fibrillation, chronic (HCC) 01/28/2020  . Acute respiratory disease due to COVID-19 virus 01/28/2020  . Tobacco abuse 01/28/2020  . Acute pancreatitis 07/20/2019  . Hypomagnesemia 11/24/2018  . Protein-calorie malnutrition, severe 11/23/2018  . Pleural effusion on left   . Liver function test abnormality   . AKI (acute kidney injury) (HCC)   . Atrial fibrillation with rapid ventricular response (HCC)   . Acute hepatitis 11/17/2018  . Atrial fibrillation with RVR (HCC) 11/17/2018  . Alcohol abuse 11/17/2018  . Tobacco dependence syndrome 11/17/2018  . Empyema [facility])     History reviewed. No pertinent surgical history.  Prior to Admission medications   Medication Sig Start Date End Date Taking? Authorizing Provider  acetaminophen (TYLENOL) 325 MG tablet Take 2 tablets (650 mg total) by mouth every 6 (six) hours as needed for mild pain or fever. 11/25/18  Yes Lorretta Harp, MD  albuterol (VENTOLIN HFA) 108 (90 Base) MCG/ACT inhaler Inhale 2 puffs into the lungs every 4 (four) hours as needed for wheezing or shortness of breath. 01/22/20  Yes Dionne Bucy, MD  aspirin EC 81 MG EC tablet  Take 1 tablet (81 mg total) by mouth daily. 11/26/18  Yes Lorretta Harp, MD  potassium chloride (KLOR-CON) 10 MEQ tablet Take 1 tablet (10 mEq total) by mouth 2 (two) times daily for 7 days. 01/22/20 01/29/20 Yes Dionne Bucy, MD    Allergies Patient has no known allergies.  Family History  Problem Relation Age of Onset  . Cancer Mother   . Kidney disease Brother     Social History Social History   Tobacco Use  . Smoking status: Current Every Day Smoker    Packs/day: 1.00    Years: 30.00    Pack years: 30.00    Types: Cigarettes  . Smokeless tobacco: Never Used  Substance Use Topics  . Alcohol use: Yes    Alcohol/week: 6.0 standard drinks    Types: 6 Cans of beer per week    Comment: daily  . Drug use: Yes    Types: Marijuana    Review of Systems Constitutional: Negative for fever/chills. Normal appetite. ENT: No sore throat. Cardiovascular: Denies chest pain. Respiratory: Negative for shortness of breath. Negative for cough. no wheezing.  Gastrointestinal: Positive for nausea,  Positive for vomiting.  no diarrhea.  Musculoskeletal: Positive for body aches Skin: Negative for rash. Neurological: Negative for headaches ____________________________________________   PHYSICAL EXAM:  VITAL SIGNS: ED Triage Vitals [01/28/20 0532]  Enc Vitals Group     BP (!) 161/100     Pulse Rate (!) 102     Resp 20     Temp 98.1 F (36.7 C)     Temp Source Oral  SpO2 99 %     Weight 170 lb (77.1 kg)     Height 5\' 8"  (1.727 m)     Head Circumference      Peak Flow      Pain Score 10     Pain Loc      Pain Edu?      Excl. in GC?     Constitutional: Alert and oriented. Overall well appearing and in no acute distress. Eyes: Conjunctivae are normal. Ears: Exam deferred. Nose: Maxillary sinus congestion noted; no rhinnorhea. Mouth/Throat: Mucous membranes are moist.  Oropharynx normal. Tonsils not visualized. Uvula midline. Neck: No stridor.  Lymphatic: No cervical  lymphadenopathy. Cardiovascular: Normal rate, regular rhythm. Good peripheral circulation. Respiratory: Respirations are even and unlabored.  No retractions. Breath sounds clear to auscultation. Gastrointestinal: Soft and diffusely tender, increased on right side abdomen.  Musculoskeletal: FROM x 4 extremities.  Neurologic:  Normal speech and language. Skin:  Skin is warm, dry and intact. No rash noted. Psychiatric: Mood and affect are normal. Speech and behavior are normal.  ____________________________________________   LABS (all labs ordered are listed, but only abnormal results are displayed)  Labs Reviewed  COMPREHENSIVE METABOLIC PANEL - Abnormal; Notable for the following components:      Result Value   Potassium 3.4 (*)    Glucose, Bld 112 (*)    BUN 7 (*)    Albumin 3.0 (*)    AST 146 (*)    Alkaline Phosphatase 213 (*)    Total Bilirubin 1.8 (*)    All other components within normal limits  LIPASE, BLOOD - Abnormal; Notable for the following components:   Lipase 518 (*)    All other components within normal limits  CBC WITH DIFFERENTIAL/PLATELET - Abnormal; Notable for the following components:   WBC 10.8 (*)    Neutro Abs 9.0 (*)    All other components within normal limits  MAGNESIUM - Abnormal; Notable for the following components:   Magnesium 1.6 (*)    All other components within normal limits  HEPATIC FUNCTION PANEL - Abnormal; Notable for the following components:   Albumin 3.1 (*)    AST 153 (*)    Alkaline Phosphatase 215 (*)    Total Bilirubin 1.7 (*)    Bilirubin, Direct 0.7 (*)    Indirect Bilirubin 1.0 (*)    All other components within normal limits  BRAIN NATRIURETIC PEPTIDE - Abnormal; Notable for the following components:   B Natriuretic Peptide 117.5 (*)    All other components within normal limits  FIBRIN DERIVATIVES D-DIMER ([facility] ONLY) - Abnormal; Notable for the following components:   Fibrin derivatives D-dimer [facility] (*)    All  other components within normal limits  FERRITIN - Abnormal; Notable for the following components:   Ferritin 466 (*)    All other components within normal limits  EXPECTORATED SPUTUM ASSESSMENT W REFEX TO RESP CULTURE  TRIGLYCERIDES  ETHANOL  FIBRINOGEN  LACTATE DEHYDROGENASE  PROCALCITONIN  HIV ANTIBODY (ROUTINE TESTING W REFLEX)  C-REACTIVE PROTEIN  HEPATITIS B SURFACE ANTIGEN  TROPONIN I (HIGH SENSITIVITY)   ____________________________________________  EKG  Not indicated. ____________________________________________  RADIOLOGY  Chest x-ray consistent with bronchitis. ____________________________________________   PROCEDURES  Procedure(s) performed: None  Critical Care performed: No ____________________________________________   INITIAL IMPRESSION / ASSESSMENT AND PLAN / ED COURSE  65 y.o. male presents to the ER for multiple complaints. He feels that he has pancreatitis. Abdominal pain started 2 days ago. He is a daily  alcohol drinker. He drank liquor last night.   Lipase is elevated at 518. Kidney function normal. Very mild leukocytosis. Bilirubin and AST elevated.   IV fluids infusing. Vital signs are reassuring. Heart rate is now 82.   Plan will be to admit for observation for fluids and pain control. Hospitalist consult entered.  Patient accepted for admission.   Medications  0.9 %  sodium chloride infusion ( Intravenous New Bag/Given 01/28/20 1102)  ondansetron (ZOFRAN) injection 4 mg (has no administration in time range)  LORazepam (ATIVAN) tablet 1-4 mg (has no administration in time range)    Or  LORazepam (ATIVAN) injection 1-4 mg (has no administration in time range)  thiamine tablet 100 mg (100 mg Oral Given 01/28/20 1059)    Or  thiamine (B-1) injection 100 mg ( Intravenous See Alternative 01/28/20 1059)  folic acid (FOLVITE) tablet 1 mg (1 mg Oral Given 01/28/20 1059)  multivitamin with minerals tablet 1 tablet (1 tablet Oral Not Given 01/28/20  1223)  LORazepam (ATIVAN) injection 0-4 mg (1 mg Intravenous Given 01/28/20 1059)    Followed by  LORazepam (ATIVAN) injection 0-4 mg (has no administration in time range)  nicotine (NICODERM CQ - dosed in mg/24 hours) patch 21 mg (21 mg Transdermal Patch Applied 01/28/20 1100)  morphine 2 MG/ML injection 2 mg (2 mg Intravenous Given 01/28/20 1436)  oxyCODONE (Oxy IR/ROXICODONE) immediate release tablet 5 mg (has no administration in time range)  ipratropium (ATROVENT HFA) inhaler 2 puff (2 puffs Inhalation Given 01/28/20 1315)  albuterol (VENTOLIN HFA) 108 (90 Base) MCG/ACT inhaler 2 puff (has no administration in time range)  dextromethorphan-guaiFENesin (MUCINEX DM) 30-600 MG per 12 hr tablet 1 tablet (has no administration in time range)  zinc sulfate capsule 220 mg (220 mg Oral Given 01/28/20 1059)  ascorbic acid (VITAMIN C) tablet 500 mg (500 mg Oral Given 01/28/20 1059)  acetaminophen (TYLENOL) tablet 650 mg (has no administration in time range)  aspirin EC tablet 81 mg (81 mg Oral Given 01/28/20 1228)  albuterol (VENTOLIN HFA) 108 (90 Base) MCG/ACT inhaler 2 puff (has no administration in time range)  enoxaparin (LOVENOX) injection 40 mg (has no administration in time range)  remdesivir 200 mg in sodium chloride 0.9% 250 mL IVPB (0 mg Intravenous Stopped 01/28/20 1318)    Followed by  remdesivir 100 mg in sodium chloride 0.9 % 100 mL IVPB (has no administration in time range)  doxycycline (VIBRA-TABS) tablet 100 mg (100 mg Oral Given 01/28/20 1059)  methylPREDNISolone sodium succinate (SOLU-MEDROL) 40 mg/mL injection 40 mg (40 mg Intravenous Given 01/28/20 1442)  sodium chloride 0.9 % bolus 1,000 mL (0 mLs Intravenous Stopped 01/28/20 1439)  ondansetron (ZOFRAN) injection 4 mg (4 mg Intravenous Given 01/28/20 0910)  fentaNYL (SUBLIMAZE) injection 100 mcg (100 mcg Intravenous Given 01/28/20 0912)  potassium chloride SA (KLOR-CON) CR tablet 40 mEq (40 mEq Oral Given 01/28/20 1059)  magnesium sulfate  IVPB 2 g 50 mL (2 g Intravenous New Bag/Given 01/28/20 1349)    ED Discharge Orders    None       Pertinent labs & imaging results that were available during my care of the patient were reviewed by me and considered in my medical decision making (see chart for details).    If controlled substance prescribed during this visit, 12 month history viewed on the [facility] prior to issuing an initial prescription for Schedule II or III opiod. ____________________________________________   FINAL CLINICAL IMPRESSION(S) / ED DIAGNOSES  Final diagnoses:  COVID-19  Alcohol-induced acute pancreatitis, unspecified complication status    Note:  This document was prepared using Dragon voice recognition software and may include unintentional dictation errors.    Chinita Pester, FNP 01/28/20 1514    Sharman Cheek, MD 01/28/20 [phone]

## 2020-01-28 NOTE — H&P (Signed)
History and Physical    Thomas Coffey QJJ:[id number] DOB: 01/12/1956 DOA: 01/28/2020  Referring MD/NP/PA:   PCP: Patient, [name]   Patient coming from:  The patient is coming from home.  At baseline, pt is independent for most of ADL.        Chief Complaint: Cough, shortness of breath, abdominal pain,  HPI: Thomas Coffey is a 65 y.o. male with medical history significant of alcohol abuse, tobacco abuse, alcoholic pancreatitis, CAD, myocardial infarction, atrial fibrillation not on anticoagulants, who presents with cough, abdominal pain.  Patient states that he has been having cough and body aches for about 10 days.  He has greenish colored sputum production.  Patient has intermittent shortness of breath and chest tightness. Pt was seen in ED and had positive COVID-19 PCR on 1/6.  Patient was given prescription for prednisone, albuterol and azithromycin.  Patient states he still has persistent cough and intermittent shortness of breath. No chest pain, fever or chills.  Patient also has nausea, vomiting, abdominal pain since yesterday, which has been progressively worsening.  He states that he has had nonbilious nonbloody vomiting few times. He states that he had mild diarrhea yesterday at home, currently no diarrhea.  His abdominal pain is located in central abdomen and hypogastric area, which is constant, sharp, moderate to severe, nonradiating.  Denies symptoms of UTI.  No unilateral weakness.   ED Course: pt was found to have lipase 518, pending alcohol level, potassium was 3.4, renal function okay, liver function (ALT 213, AST 146, ALT 37, total bilirubin 1.8), temperature normal, blood pressure 128/106, 172/98, heart rate 105, 82, RR 20, oxygen saturation 96% on room air.  Chest x-ray showed acute bronchitic change.  Patient is placed in a MedSurg bed for physician   Review of Systems:   General: no fevers, chills, no body weight gain, has poor appetite, has fatigue HEENT: no  blurry vision, hearing changes or sore throat Respiratory: has dyspnea, coughing, no wheezing CV: no chest pain, no palpitations GI: has nausea, vomiting, abdominal pain, diarrhea, no constipation GU: no dysuria, burning on urination, increased urinary frequency, hematuria  Ext: no leg edema Neuro: no unilateral weakness, numbness, or tingling, no vision change or hearing loss Skin: no rash, no skin tear. MSK: No muscle spasm, no deformity, no limitation of range of movement in spin Heme: No easy bruising.  Travel history: No recent long distant travel.  Allergy: No Known Allergies  Past Medical History:  Diagnosis Date  . MI (myocardial infarction) (HCC)     History reviewed. No pertinent surgical history.  Social History:  reports that he has been smoking cigarettes. He has a 30.00 pack-year smoking history. He has never used smokeless tobacco. He reports current alcohol use of about 6.0 standard drinks of alcohol per week. He reports current drug use. Drug: Marijuana.  Family History:  Family History  Problem Relation Age of Onset  . Cancer Mother   . Kidney disease Brother      Prior to Admission medications   Medication Sig Start Date End Date Taking? Authorizing Provider  acetaminophen (TYLENOL) 325 MG tablet Take 2 tablets (650 mg total) by mouth every 6 (six) hours as needed for mild pain or fever. 11/25/18   Lorretta Harp, MD  albuterol (VENTOLIN HFA) 108 (90 Base) MCG/ACT inhaler Inhale 2 puffs into the lungs every 4 (four) hours as needed for wheezing or shortness of breath. 01/22/20   Dionne Bucy, MD  amoxicillin-clavulanate (AUGMENTIN) [phone]  MG tablet Take 1 tablet by mouth every 12 (twelve) hours. 11/25/18   Lorretta Harp, MD  aspirin EC 81 MG EC tablet Take 1 tablet (81 mg total) by mouth daily. 11/26/18   Lorretta Harp, MD  folic acid (FOLVITE) 1 MG tablet Take 1 tablet (1 mg total) by mouth daily. 11/26/18   Lorretta Harp, MD  Multiple Vitamin (MULTIVITAMIN WITH  MINERALS) TABS tablet Take 1 tablet by mouth daily. 11/26/18   Lorretta Harp, MD  nicotine (NICODERM CQ - DOSED IN MG/24 HOURS) 21 mg/24hr patch Place 1 patch (21 mg total) onto the skin daily. 11/26/18   Lorretta Harp, MD  ondansetron (ZOFRAN) 4 MG tablet Take 1 tablet (4 mg total) by mouth daily as needed for nausea or vomiting. 05/13/19   Minna Antis, MD  oxyCODONE-acetaminophen (PERCOCET) 5-325 MG tablet Take 1 tablet by mouth every 4 (four) hours as needed for severe pain. 05/13/19   Minna Antis, MD  potassium chloride (KLOR-CON) 10 MEQ tablet Take 1 tablet (10 mEq total) by mouth 2 (two) times daily for 7 days. 01/22/20 01/29/20  Dionne Bucy, MD  thiamine 100 MG tablet Take 1 tablet (100 mg total) by mouth daily. 11/26/18   Lorretta Harp, MD    Physical Exam: Vitals:   01/28/20 0532 01/28/20 0700 01/28/20 0845 01/28/20 1036  BP: (!) 161/100 (!) 128/106 (!) 172/98 (!) 180/104  Pulse: (!) 102 (!) 105 82 87  Resp: 20 18 18    Temp: 98.1 F (36.7 C)     TempSrc: Oral     SpO2: 99% 99% 96%   Weight: 77.1 kg     Height: 5\' 8"  (1.727 m)      General: Not in acute distress HEENT:       Eyes: PERRL, EOMI, no scleral icterus.       ENT: No discharge from the ears and nose, no pharynx injection, no tonsillar enlargement.        Neck: No JVD, no bruit, no mass felt. Heme: No neck lymph node enlargement. Cardiac: S1/S2, RRR, No murmurs, No gallops or rubs. Respiratory: No rales, wheezing, rhonchi or rubs. GI: Soft, nondistended, has tenderness on central and epigastric area, no rebound pain, no organomegaly, BS present. GU: No hematuria Ext: No pitting leg edema bilaterally. 1+DP/PT pulse bilaterally. Musculoskeletal: No joint deformities, No joint redness or warmth, no limitation of ROM in spin. Skin: No rashes.  Neuro: Alert, oriented X3, cranial nerves II-XII grossly intact, moves all extremities normally. Psych: Patient is not psychotic, no suicidal or hemocidal  ideation.  Labs on Admission: I have personally reviewed following labs and imaging studies  CBC: Recent Labs  Lab 01/22/20 1000 01/28/20 0724  WBC 6.6 10.8*  NEUTROABS  --  9.0*  HGB 15.3 16.4  HCT 45.8 48.5  MCV 94.8 92.7  PLT 152 183   Basic Metabolic Panel: Recent Labs  Lab 01/22/20 1000 01/28/20 0724  NA 136 138  K 2.9* 3.4*  CL 99 100  CO2 24 24  GLUCOSE 142* 112*  BUN <5* 7*  CREATININE 0.74 0.62  CALCIUM 8.3* 9.2  MG  --  1.6*   GFR: Estimated Creatinine Clearance: 90.3 mL/min (by C-G formula based on SCr of 0.62 mg/dL). Liver Function Tests: Recent Labs  Lab 01/28/20 0724  AST 153*  146*  ALT 39  37  ALKPHOS 215*  213*  BILITOT 1.7*  1.8*  PROT 7.4  7.3  ALBUMIN 3.1*  3.0*   Recent Labs  Lab 01/28/20  7939  LIPASE 518*   No results for input(s): AMMONIA in the last 168 hours. Coagulation Profile: No results for input(s): INR, PROTIME in the last 168 hours. Cardiac Enzymes: No results for input(s): CKTOTAL, CKMB, CKMBINDEX, TROPONINI in the last 168 hours. BNP (last 3 results) No results for input(s): PROBNP in the last 8760 hours. HbA1C: No results for input(s): HGBA1C in the last 72 hours. CBG: No results for input(s): GLUCAP in the last 168 hours. Lipid Profile: Recent Labs    01/28/20 0724  TRIG 84   Thyroid Function Tests: No results for input(s): TSH, T4TOTAL, FREET4, T3FREE, THYROIDAB in the last 72 hours. Anemia Panel: No results for input(s): VITAMINB12, FOLATE, FERRITIN, TIBC, IRON, RETICCTPCT in the last 72 hours. Urine analysis:    Component Value Date/Time   COLORURINE YELLOW (A) 07/19/2019 1927   APPEARANCEUR HAZY (A) 07/19/2019 1927   LABSPEC 1.001 (L) 07/19/2019 1927   PHURINE 6.0 07/19/2019 1927   GLUCOSEU NEGATIVE 07/19/2019 1927   HGBUR NEGATIVE 07/19/2019 1927   BILIRUBINUR NEGATIVE 07/19/2019 1927   KETONESUR NEGATIVE 07/19/2019 1927   PROTEINUR NEGATIVE 07/19/2019 1927   NITRITE NEGATIVE 07/19/2019  1927   LEUKOCYTESUR NEGATIVE 07/19/2019 1927   Sepsis Labs: @LABRCNTIP (procalcitonin:4,lacticidven:4) ) Recent Results (from the past 240 hour(s))  SARS CORONAVIRUS 2 (TAT 6-24 HRS) Nasopharyngeal Nasopharyngeal Swab     Status: Abnormal   Collection Time: 01/22/20  5:35 PM   Specimen: Nasopharyngeal Swab  Result Value Ref Range Status   SARS Coronavirus 2 POSITIVE (A) NEGATIVE Final    Comment: (NOTE) SARS-CoV-2 target nucleic acids are DETECTED.  The SARS-CoV-2 RNA is generally detectable in upper and lower respiratory specimens during the acute phase of infection. Positive results are indicative of the presence of SARS-CoV-2 RNA. Clinical correlation with patient history and other diagnostic information is  necessary to determine patient infection status. Positive results do not rule out bacterial infection or co-infection with other viruses.  The expected result is Negative.  Fact Sheet for Patients: HairSlick.no  Fact Sheet for Healthcare Providers: quierodirigir.com  This test is not yet approved or cleared by the [place] FDA and  has been authorized for detection and/or diagnosis of SARS-CoV-2 by FDA under an Emergency Use Authorization (EUA). This EUA will remain  in effect (meaning this test can be used) for the duration of the COVID-19 declaration under Section 564(b)(1) of the Act, 21 U. S.C. section 360bbb-3(b)(1), unless the authorization is terminated or revoked sooner.   Performed at [facility], [address], [place], [place] [address]      Radiological Exams on Admission: DG Chest 2 View  Result Date: 01/28/2020 CLINICAL DATA:  Body aches, productive cough and congestion EXAM: CHEST - 2 VIEW COMPARISON:  Radiograph 01/22/2020 FINDINGS: Mild airways thickening and coarsened interstitial changes slightly more pronounced than on comparison imaging. No consolidative opacity or convincing  features of edema. No pneumothorax or visible effusion. No suspicious pulmonary nodules or masses. The aorta is calcified. The remaining cardiomediastinal contours are unremarkable. No acute osseous or soft tissue abnormality. Degenerative changes are present in the imaged spine and shoulders. IMPRESSION: Mild airways thickening and coarsened interstitium could reflect an acute bronchitis, likely on a background of more chronic smoking related changes. Aortic Atherosclerosis (ICD10-I70.0). Electronically Signed   By: Kreg Shropshire M.D.   On: 01/28/2020 05:49     EKG: Not done in ED, will get one.   Assessment/Plan Principal Problem:   Acute respiratory disease due to COVID-19  virus Active Problems:   Alcohol abuse   Hypomagnesemia   Acute pancreatitis   Abnormal LFTs   CAD (coronary artery disease)   Atrial fibrillation, chronic (HCC)   Tobacco abuse   Acute respiratory disease due to COVID-19 virus: Patient does not have oxygen desaturation, but chest x-ray showed acute bronchitis change.  Patient has persistent cough, intermittent shortness of breath.   -will admit to med-surg bed as inpt -Remdesivir per pharm - will Solumedrol 40 mg bid - swill start doxycycline 100 mg orally twice daily since patient has mild leukocytosis -vitamin C, zinc.  -Bronchodilators -PRN Mucinex for cough -f/u Blood culture -Gentle IV fluid:  -D-dimer, BNP,Trop, LFT, CRP, LDH, Procalcitonin, Ferritin, fibinogen, TG, Hep B SAg, HIV ab -Daily CRP, Ferritin, D-dimer, -Will ask the patient to maintain an awake prone position for 16+ hours a day, if possible, with a minimum of 2-3 hours at a time -Will attempt to maintain euvolemia to a net negative fluid status -IF patient deteriorates, will consult PCCM and ID  Acute pancreatitis: Lipase 518.  Most likely due to alcohol abuse -IV fluid: 1 L normal saline, followed by 125 cc/h -As needed morphine, oxycodone for pain -As needed Zofran -Repeat lipase in  the morning -check TG level -->84  Tobacco abuse and Alcohol abuse: -Nicotine patch -CIWA protocol  Hypokalemia: K= 3.4 and Mg 1.6  on admission. - Repleted both - Check Mg level  Abnormal LFTs: Most likely due to alcohol abuse. -check hepatitis panel and HIV antibody -Avoid using Tylenol  CAD (coronary artery disease): no CP.  -Continue aspirin  Atrial fibrillation, chronic (HCC): Heart rate 105, 82, patient is not taking anticoagulants or nodal blockers -Get EKG -Cardiac monitoring     DVT ppx:   SQ Lovenox Code Status: Full code Family Communication: not done, no family member is at bed side.    Disposition Plan:  Anticipate discharge back to previous environment Consults called:  none Admission status: Med-surg bed for obs   Status is: Observation  The patient remains OBS appropriate and will d/c before 2 midnights.  Dispo: The patient is from: Home              Anticipated d/c is to: Home              Anticipated d/c date is: 1 day              Patient currently is not medically stable to d/c.          Date of Service 01/28/2020    Lorretta Harp [facility]   If 7PM-7AM, please contact night-coverage [email or web address] 01/28/2020, 1:20 PM

## 2020-01-28 NOTE — ED Notes (Signed)
Pt placed back on monitors, reminded not to take off

## 2020-01-28 NOTE — Consult Note (Signed)
Remdesivir - Pharmacy Brief Note   O:  ALT: 37 CXR: "Mild airways thickening and coarsened interstitium could reflect an acute bronchitis" SpO2: On room air   A/P:  01/22/20 SARS-CoV-2 Ag (+)  Remdesivir 200 mg IVPB once followed by 100 mg IVPB daily x 4 days.   [name] 01/28/2020 10:20 AM

## 2020-01-29 DIAGNOSIS — I482 Chronic atrial fibrillation, unspecified: Secondary | ICD-10-CM | POA: Diagnosis present

## 2020-01-29 DIAGNOSIS — F1010 Alcohol abuse, uncomplicated: Secondary | ICD-10-CM | POA: Diagnosis not present

## 2020-01-29 DIAGNOSIS — K852 Alcohol induced acute pancreatitis without necrosis or infection: Secondary | ICD-10-CM | POA: Diagnosis not present

## 2020-01-29 DIAGNOSIS — E876 Hypokalemia: Secondary | ICD-10-CM | POA: Diagnosis present

## 2020-01-29 DIAGNOSIS — J690 Pneumonitis due to inhalation of food and vomit: Secondary | ICD-10-CM | POA: Diagnosis not present

## 2020-01-29 DIAGNOSIS — R945 Abnormal results of liver function studies: Secondary | ICD-10-CM | POA: Diagnosis not present

## 2020-01-29 DIAGNOSIS — Z515 Encounter for palliative care: Secondary | ICD-10-CM | POA: Diagnosis not present

## 2020-01-29 DIAGNOSIS — E162 Hypoglycemia, unspecified: Secondary | ICD-10-CM | POA: Diagnosis not present

## 2020-01-29 DIAGNOSIS — J208 Acute bronchitis due to other specified organisms: Secondary | ICD-10-CM | POA: Diagnosis present

## 2020-01-29 DIAGNOSIS — K760 Fatty (change of) liver, not elsewhere classified: Secondary | ICD-10-CM | POA: Diagnosis present

## 2020-01-29 DIAGNOSIS — Z7982 Long term (current) use of aspirin: Secondary | ICD-10-CM | POA: Diagnosis not present

## 2020-01-29 DIAGNOSIS — U071 COVID-19: Secondary | ICD-10-CM | POA: Diagnosis present

## 2020-01-29 DIAGNOSIS — K8020 Calculus of gallbladder without cholecystitis without obstruction: Secondary | ICD-10-CM | POA: Diagnosis present

## 2020-01-29 DIAGNOSIS — J069 Acute upper respiratory infection, unspecified: Secondary | ICD-10-CM | POA: Diagnosis not present

## 2020-01-29 DIAGNOSIS — G9341 Metabolic encephalopathy: Secondary | ICD-10-CM | POA: Diagnosis not present

## 2020-01-29 DIAGNOSIS — Z809 Family history of malignant neoplasm, unspecified: Secondary | ICD-10-CM | POA: Diagnosis not present

## 2020-01-29 DIAGNOSIS — E43 Unspecified severe protein-calorie malnutrition: Secondary | ICD-10-CM | POA: Diagnosis not present

## 2020-01-29 DIAGNOSIS — F10230 Alcohol dependence with withdrawal, uncomplicated: Secondary | ICD-10-CM | POA: Diagnosis not present

## 2020-01-29 DIAGNOSIS — R627 Adult failure to thrive: Secondary | ICD-10-CM | POA: Diagnosis not present

## 2020-01-29 DIAGNOSIS — I252 Old myocardial infarction: Secondary | ICD-10-CM | POA: Diagnosis not present

## 2020-01-29 DIAGNOSIS — Z841 Family history of disorders of kidney and ureter: Secondary | ICD-10-CM | POA: Diagnosis not present

## 2020-01-29 DIAGNOSIS — Z7189 Other specified counseling: Secondary | ICD-10-CM | POA: Diagnosis not present

## 2020-01-29 DIAGNOSIS — F17210 Nicotine dependence, cigarettes, uncomplicated: Secondary | ICD-10-CM | POA: Diagnosis present

## 2020-01-29 DIAGNOSIS — Z79899 Other long term (current) drug therapy: Secondary | ICD-10-CM | POA: Diagnosis not present

## 2020-01-29 DIAGNOSIS — Z6823 Body mass index (BMI) 23.0-23.9, adult: Secondary | ICD-10-CM | POA: Diagnosis not present

## 2020-01-29 DIAGNOSIS — I10 Essential (primary) hypertension: Secondary | ICD-10-CM | POA: Diagnosis present

## 2020-01-29 DIAGNOSIS — I2510 Atherosclerotic heart disease of native coronary artery without angina pectoris: Secondary | ICD-10-CM | POA: Diagnosis present

## 2020-01-29 LAB — CBC WITH DIFFERENTIAL/PLATELET
Abs Immature Granulocytes: 0.05 10*3/uL (ref 0.00–0.07)
Basophils Absolute: 0 10*3/uL (ref 0.0–0.1)
Basophils Relative: 0 %
Eosinophils Absolute: 0 10*3/uL (ref 0.0–0.5)
Eosinophils Relative: 0 %
HCT: 43.1 % (ref 39.0–52.0)
Hemoglobin: 14.8 g/dL (ref 13.0–17.0)
Immature Granulocytes: 1 %
Lymphocytes Relative: 8 %
Lymphs Abs: 0.8 10*3/uL (ref 0.7–4.0)
MCH: 31.6 pg (ref 26.0–34.0)
MCHC: 34.3 g/dL (ref 30.0–36.0)
MCV: 91.9 fL (ref 80.0–100.0)
Monocytes Absolute: 1 10*3/uL (ref 0.1–1.0)
Monocytes Relative: 9 %
Neutro Abs: 8.5 10*3/uL — ABNORMAL HIGH (ref 1.7–7.7)
Neutrophils Relative %: 82 %
Platelets: 172 10*3/uL (ref 150–400)
RBC: 4.69 MIL/uL (ref 4.22–5.81)
RDW: 13.8 % (ref 11.5–15.5)
WBC: 10.3 10*3/uL (ref 4.0–10.5)
nRBC: 0 % (ref 0.0–0.2)

## 2020-01-29 LAB — COMPREHENSIVE METABOLIC PANEL
ALT: 28 U/L (ref 0–44)
AST: 94 U/L — ABNORMAL HIGH (ref 15–41)
Albumin: 2.6 g/dL — ABNORMAL LOW (ref 3.5–5.0)
Alkaline Phosphatase: 171 U/L — ABNORMAL HIGH (ref 38–126)
Anion gap: 11 (ref 5–15)
BUN: 9 mg/dL (ref 8–23)
CO2: 22 mmol/L (ref 22–32)
Calcium: 8.6 mg/dL — ABNORMAL LOW (ref 8.9–10.3)
Chloride: 101 mmol/L (ref 98–111)
Creatinine, Ser: 0.57 mg/dL — ABNORMAL LOW (ref 0.61–1.24)
GFR, Estimated: >60 mL/min (ref >60)
Glucose, Bld: 92 mg/dL (ref 70–99)
Potassium: 3.5 mmol/L (ref 3.5–5.1)
Sodium: 134 mmol/L — ABNORMAL LOW (ref 135–145)
Total Bilirubin: 1.9 mg/dL — ABNORMAL HIGH (ref 0.3–1.2)
Total Protein: 6.6 g/dL (ref 6.5–8.1)

## 2020-01-29 LAB — MAGNESIUM: Magnesium: 1.7 mg/dL (ref 1.7–2.4)

## 2020-01-29 LAB — PHOSPHORUS: Phosphorus: 3.3 mg/dL (ref 2.5–4.6)

## 2020-01-29 LAB — FERRITIN: Ferritin: 429 ng/mL — ABNORMAL HIGH (ref 24–336)

## 2020-01-29 LAB — C-REACTIVE PROTEIN: CRP: 7.4 mg/dL — ABNORMAL HIGH (ref <1.0)

## 2020-01-29 LAB — FIBRIN DERIVATIVES D-DIMER (ARMC ONLY): Fibrin derivatives D-dimer (ARMC): 2453.64 ng/mL (FEU) — ABNORMAL HIGH (ref 0.00–499.00)

## 2020-01-29 LAB — LIPASE, BLOOD: Lipase: 184 U/L — ABNORMAL HIGH (ref 11–51)

## 2020-01-29 MED ORDER — DM-GUAIFENESIN ER 30-600 MG PO TB12
1.0000 | ORAL_TABLET | Freq: Two times a day (BID) | ORAL | Status: DC
  Administered 2020-01-29 – 2020-02-19 (×26): 1 via ORAL
  Filled 2020-01-29 (×32): qty 1

## 2020-01-29 MED ORDER — LABETALOL HCL 5 MG/ML IV SOLN
10.0000 mg | INTRAVENOUS | Status: DC | PRN
  Administered 2020-01-31: 10 mg via INTRAVENOUS
  Filled 2020-01-29: qty 4

## 2020-01-29 NOTE — Progress Notes (Signed)
PROGRESS NOTE    Thomas Coffey   ASN:[id number]  DOB: 1955/05/08  PCP: Patient, [name]    DOA: 01/28/2020 LOS: 0   Brief Narrative   Cross Thomas Coffey is a 65 y.o. male with medical history significant of alcohol abuse, tobacco abuse, alcoholic pancreatitis, CAD, myocardial infarction, atrial fibrillation not on anticoagulants, who presented to the ED on 01/28/2020 with 10 days of cough and body aches, in addition to progressive abdominal pain with nausea vomiting since the day before.  Evaluation in the ED revealed acute pancreatitis with lipase 518.  COVID-19 positive.  Liver enzymes elevated.  Chest x-ray showed acute bronchitic changes.     Assessment & Plan   Principal Problem:   Acute respiratory disease due to COVID-19 virus Active Problems:   Alcohol abuse   Hypomagnesemia   Acute pancreatitis   Abnormal LFTs   CAD (coronary artery disease)   Atrial fibrillation, chronic (HCC)   Tobacco abuse   COVID-19   COVID-19 infection -presented with 10-day history of body aches and cough, tested positive in the ED.  Has not been hypoxic.  Chest x-ray showed bronchitic changes. -- Continue remdesivir -- Stop steroids given no hypoxia -- Monitor inflammatory markers, CMP daily -- Scheduled Mucinex, as needed albuterol -- Vitamin C, zinc -- Continue doxycycline in case of secondary bacterial bronchitis  Acute pancreatitis -present on admission, due to alcohol abuse. Patient reported tolerating clear liquids for breakfast and would like to try advancing diet.   Lipase trended 518>> 184 -- Advance diet to soft -- Continue IV fluids per order -- Pain control per orders -- As needed Zofran  Hypokalemia  / Hypomagnesemia -present on admission.  Both were replaced.   -- Monitor BMP and Mg daily and replace as needed  Alcohol abuse -on CIWA protocol with as needed Ativan.  Morning CIWA scores were 7 and 11.  Tobacco abuse -nicotine patch.  Counseled on the importance  of cessation.  Transaminitis -likely due to alcohol abuse.  Monitor CMP closely since on remdesivir.  CAD -chronic, stable without active chest pain or acute ischemic changes on EKG.   -- Continue aspirin -- Stat EKG and troponins if chest pain occurs  A. fib, chronic -rate slightly high at 100s due to pancreatitis and possibly withdrawal from alcohol.  Not on anticoagulation or rate control agent. -- Monitor on telemetry    DVT prophylaxis: enoxaparin (LOVENOX) injection 40 mg Start: 01/28/20 2200   Diet:  Diet Orders (From admission, onward)    Start     Ordered   01/29/20 1104  DIET SOFT Room service appropriate? Yes; Fluid consistency: Thin  Diet effective now       Question Answer Comment  Room service appropriate? Yes   Fluid consistency: Thin      01/29/20 1103            Code Status: Full Code    Subjective 01/29/20    Patient seen today at bedside.  Reports he got clear liquids for breakfast, mostly tolerated but caused a little burning epigastric pain.  Says he wants to try to eat some regular/solid foods today.  Denies shortness of breath, fevers or chills, chest pain, nausea vomiting diarrhea or other complaints.  No acute events reported.   Disposition Plan & Communication   Status is: Inpatient  Remains inpatient appropriate because:IV treatments appropriate due to intensity of illness or inability to take PO   Dispo: The patient is from: Home  Anticipated d/c is to: Home              Anticipated d/c date is: 2 days              Patient currently is not medically stable to d/c.    Consults, Procedures, Significant Events   Consultants:   None  Procedures:   None  Antimicrobials:  Anti-infectives (From admission, onward)   Start     Dose/Rate Route Frequency Ordered Stop   01/29/20 1000  azithromycin (ZITHROMAX) tablet 250 mg  Status:  Discontinued       "Followed by" Linked Group Details   250 mg Oral Daily 01/28/20 1010  01/28/20 1025   01/29/20 1000  remdesivir 100 mg in sodium chloride 0.9 % 100 mL IVPB       "Followed by" Linked Group Details   100 mg 200 mL/hr over 30 Minutes Intravenous Daily 01/28/20 1024 02/02/20 0959   01/28/20 1200  remdesivir 200 mg in sodium chloride 0.9% 250 mL IVPB       "Followed by" Linked Group Details   200 mg 580 mL/hr over 30 Minutes Intravenous Once 01/28/20 1024 01/28/20 1318   01/28/20 1030  doxycycline (VIBRA-TABS) tablet 100 mg        100 mg Oral Every 12 hours 01/28/20 1025     01/28/20 1015  azithromycin (ZITHROMAX) tablet 500 mg  Status:  Discontinued       "Followed by" Linked Group Details   500 mg Oral Daily 01/28/20 1010 01/28/20 1025        Objective   Vitals:   01/28/20 1939 01/28/20 2336 01/29/20 0440 01/29/20 0746  BP: (!) 181/104 (!) 155/96 (!) 159/106 (!) 158/121  Pulse: 86 85 (!) 106 (!) 101  Resp: 18 18 16 16   Temp: 98.9 F (37.2 C) 98.6 F (37 C) 98.5 F (36.9 C) 97.6 F (36.4 C)  TempSrc:    Oral  SpO2: 98% 98% 100% 98%  Weight:      Height:        Intake/Output Summary (Last 24 hours) at 01/29/2020 1659 Last data filed at 01/29/2020 0654 Gross per 24 hour  Intake --  Output 5 ml  Net -5 ml   Filed Weights   01/28/20 0532  Weight: 77.1 kg    Physical Exam:  General exam: awake, alert, no acute distress Respiratory system: Symmetric chest rise, normal respiratory effort, on room air. Cardiovascular system: RRR, no pedal edema.   Gastrointestinal system: soft, tender on palpation with voluntary guarding, no rebound tenderness, positive bowel sounds Central nervous system: A&O x3. no gross focal neurologic deficits, normal speech Extremities: moves all, no edema, normal tone, no cyanosis Skin: dry, intact, normal temperature Psychiatry: normal mood, flat affect, judgement and insight appear normal  Labs   Data Reviewed: I have personally reviewed following labs and imaging studies  CBC: Recent Labs  Lab 01/28/20 0724  01/29/20 0418  WBC 10.8* 10.3  NEUTROABS 9.0* 8.5*  HGB 16.4 14.8  HCT 48.5 43.1  MCV 92.7 91.9  PLT 183 172   Basic Metabolic Panel: Recent Labs  Lab 01/28/20 0724 01/29/20 0418  NA 138 134*  K 3.4* 3.5  CL 100 101  CO2 24 22  GLUCOSE 112* 92  BUN 7* 9  CREATININE 0.62 0.57*  CALCIUM 9.2 8.6*  MG 1.6* 1.7  PHOS  --  3.3   GFR: Estimated Creatinine Clearance: 93.3 mL/min (A) (by C-G formula based on SCr of 0.57 mg/dL (  L)). Liver Function Tests: Recent Labs  Lab 01/28/20 0724 01/29/20 0418  AST 153*  146* 94*  ALT 39  37 28  ALKPHOS 215*  213* 171*  BILITOT 1.7*  1.8* 1.9*  PROT 7.4  7.3 6.6  ALBUMIN 3.1*  3.0* 2.6*   Recent Labs  Lab 01/28/20 0724 01/29/20 0418  LIPASE 518* 184*   No results for input(s): AMMONIA in the last 168 hours. Coagulation Profile: No results for input(s): INR, PROTIME in the last 168 hours. Cardiac Enzymes: No results for input(s): CKTOTAL, CKMB, CKMBINDEX, TROPONINI in the last 168 hours. BNP (last 3 results) No results for input(s): PROBNP in the last 8760 hours. HbA1C: No results for input(s): HGBA1C in the last 72 hours. CBG: No results for input(s): GLUCAP in the last 168 hours. Lipid Profile: Recent Labs    01/28/20 0724  TRIG 84   Thyroid Function Tests: No results for input(s): TSH, T4TOTAL, FREET4, T3FREE, THYROIDAB in the last 72 hours. Anemia Panel: Recent Labs    01/28/20 1310 01/29/20 0418  FERRITIN 466* 429*   Sepsis Labs: Recent Labs  Lab 01/28/20 1310  PROCALCITON 0.11    Recent Results (from the past 240 hour(s))  SARS CORONAVIRUS 2 (TAT 6-24 HRS) Nasopharyngeal Nasopharyngeal Swab     Status: Abnormal   Collection Time: 01/22/20  5:35 PM   Specimen: Nasopharyngeal Swab  Result Value Ref Range Status   SARS Coronavirus 2 POSITIVE (A) NEGATIVE Final    Comment: (NOTE) SARS-CoV-2 target nucleic acids are DETECTED.  The SARS-CoV-2 RNA is generally detectable in upper and  lower respiratory specimens during the acute phase of infection. Positive results are indicative of the presence of SARS-CoV-2 RNA. Clinical correlation with patient history and other diagnostic information is  necessary to determine patient infection status. Positive results do not rule out bacterial infection or co-infection with other viruses.  The expected result is Negative.  Fact Sheet for Patients: HairSlick.no  Fact Sheet for Healthcare Providers: quierodirigir.com  This test is not yet approved or cleared by the [place] FDA and  has been authorized for detection and/or diagnosis of SARS-CoV-2 by FDA under an Emergency Use Authorization (EUA). This EUA will remain  in effect (meaning this test can be used) for the duration of the COVID-19 declaration under Section 564(b)(1) of the Act, 21 U. S.C. section 360bbb-3(b)(1), unless the authorization is terminated or revoked sooner.   Performed at [facility], [address] [place], [place] [address]       Imaging Studies   DG Chest 2 View  Result Date: 01/28/2020 CLINICAL DATA:  Body aches, productive cough and congestion EXAM: CHEST - 2 VIEW COMPARISON:  Radiograph 01/22/2020 FINDINGS: Mild airways thickening and coarsened interstitial changes slightly more pronounced than on comparison imaging. No consolidative opacity or convincing features of edema. No pneumothorax or visible effusion. No suspicious pulmonary nodules or masses. The aorta is calcified. The remaining cardiomediastinal contours are unremarkable. No acute osseous or soft tissue abnormality. Degenerative changes are present in the imaged spine and shoulders. IMPRESSION: Mild airways thickening and coarsened interstitium could reflect an acute bronchitis, likely on a background of more chronic smoking related changes. Aortic Atherosclerosis (ICD10-I70.0). Electronically Signed   By: Kreg Shropshire  M.D.   On: 01/28/2020 05:49     Medications   Scheduled Meds: . vitamin C  500 mg Oral Daily  . aspirin EC  81 mg Oral Daily  . doxycycline  100 mg Oral Q12H  .  enoxaparin (LOVENOX) injection  40 mg Subcutaneous Q24H  . folic acid  1 mg Oral Daily  . ipratropium  2 puff Inhalation Q6H  . LORazepam  0-4 mg Intravenous Q6H   Followed by  . [START ON 01/30/2020] LORazepam  0-4 mg Intravenous Q12H  . methylPREDNISolone (SOLU-MEDROL) injection  40 mg Intravenous Q12H  . multivitamin with minerals  1 tablet Oral Daily  . nicotine  21 mg Transdermal Daily  . thiamine  100 mg Oral Daily   Or  . thiamine  100 mg Intravenous Daily  . zinc sulfate  220 mg Oral Daily   Continuous Infusions: . sodium chloride 125 mL/hr at 01/29/20 1413  . remdesivir 100 mg in NS 100 mL 100 mg (01/29/20 0851)       LOS: 0 days    Time spent: 30 minutes    Pennie Banter, DO [facility]  01/29/2020, 4:59 PM    If 7PM-7AM, please contact night-coverage. How to contact the [facility] Attending or Consulting provider 7A - 7P or covering provider during after hours 7P -7A, for this patient?    1. Check the care team in [facility] and look for a) attending/consulting TRH provider listed and b) the [facility] team listed 2. Log into [email or web address] and use [place]'s universal password to access. If you do not have the password, please contact the hospital operator. 3. Locate the [facility] provider you are looking for under [facility] and page to a number that you can be directly reached. 4. If you still have difficulty reaching the provider, please page the [facility] (Director on Call) for the Hospitalists listed on amion for assistance.

## 2020-01-29 NOTE — Hospital Course (Addendum)
DAGOBERTO NEALY is a 65 y.o. male with medical history significant of alcohol abuse, tobacco abuse, alcoholic pancreatitis, CAD, myocardial infarction, atrial fibrillation not on anticoagulants, who presented to the ED on 01/28/2020 with 10 days of cough and body aches, in addition to progressive abdominal pain with nausea vomiting since the day before.  Evaluation in the ED revealed acute pancreatitis with lipase 518.  COVID-19 positive.  Liver enzymes elevated.  Chest x-ray showed acute bronchitic changes.  Patient has had issues with dysphagia, and made NPO on multiple occasions.  This seems secondary to his encephalopathy.  SLP consulted.  Currently tolerating dysphagia 1 diet.   Has been off CIWA and Ativan and doing well.  Started on PRN Vistaril for anxiety/agitation as pt continues to try to get out of the bed.  He has mostly been redirectable by staff for the past few days.  Hospitalization prolonged due to difficult disposition.  Pt requires SNF placement, is profoundly weak.  Palliative discussions on GOC with family are ongoing.  May get PEG tube.

## 2020-01-30 DIAGNOSIS — K852 Alcohol induced acute pancreatitis without necrosis or infection: Secondary | ICD-10-CM | POA: Diagnosis not present

## 2020-01-30 DIAGNOSIS — F1010 Alcohol abuse, uncomplicated: Secondary | ICD-10-CM | POA: Diagnosis not present

## 2020-01-30 DIAGNOSIS — U071 COVID-19: Secondary | ICD-10-CM | POA: Diagnosis not present

## 2020-01-30 LAB — COMPREHENSIVE METABOLIC PANEL
ALT: 30 U/L (ref 0–44)
AST: 106 U/L — ABNORMAL HIGH (ref 15–41)
Albumin: 2.4 g/dL — ABNORMAL LOW (ref 3.5–5.0)
Alkaline Phosphatase: 189 U/L — ABNORMAL HIGH (ref 38–126)
Anion gap: 8 (ref 5–15)
BUN: 9 mg/dL (ref 8–23)
CO2: 24 mmol/L (ref 22–32)
Calcium: 8.6 mg/dL — ABNORMAL LOW (ref 8.9–10.3)
Chloride: 104 mmol/L (ref 98–111)
Creatinine, Ser: 0.55 mg/dL — ABNORMAL LOW (ref 0.61–1.24)
GFR, Estimated: >60 mL/min (ref >60)
Glucose, Bld: 97 mg/dL (ref 70–99)
Potassium: 3.4 mmol/L — ABNORMAL LOW (ref 3.5–5.1)
Sodium: 136 mmol/L (ref 135–145)
Total Bilirubin: 1.8 mg/dL — ABNORMAL HIGH (ref 0.3–1.2)
Total Protein: 6.3 g/dL — ABNORMAL LOW (ref 6.5–8.1)

## 2020-01-30 LAB — CBC WITH DIFFERENTIAL/PLATELET
Abs Immature Granulocytes: 0.04 10*3/uL (ref 0.00–0.07)
Basophils Absolute: 0 10*3/uL (ref 0.0–0.1)
Basophils Relative: 0 %
Eosinophils Absolute: 0 10*3/uL (ref 0.0–0.5)
Eosinophils Relative: 0 %
HCT: 42.5 % (ref 39.0–52.0)
Hemoglobin: 14.9 g/dL (ref 13.0–17.0)
Immature Granulocytes: 0 %
Lymphocytes Relative: 9 %
Lymphs Abs: 0.8 10*3/uL (ref 0.7–4.0)
MCH: 32.1 pg (ref 26.0–34.0)
MCHC: 35.1 g/dL (ref 30.0–36.0)
MCV: 91.6 fL (ref 80.0–100.0)
Monocytes Absolute: 0.7 10*3/uL (ref 0.1–1.0)
Monocytes Relative: 8 %
Neutro Abs: 7.7 10*3/uL (ref 1.7–7.7)
Neutrophils Relative %: 83 %
Platelets: 173 10*3/uL (ref 150–400)
RBC: 4.64 MIL/uL (ref 4.22–5.81)
RDW: 13.6 % (ref 11.5–15.5)
WBC: 9.3 10*3/uL (ref 4.0–10.5)
nRBC: 0 % (ref 0.0–0.2)

## 2020-01-30 LAB — FIBRIN DERIVATIVES D-DIMER (ARMC ONLY): Fibrin derivatives D-dimer (ARMC): 1794.68 ng/mL (FEU) — ABNORMAL HIGH (ref 0.00–499.00)

## 2020-01-30 LAB — MAGNESIUM: Magnesium: 1.6 mg/dL — ABNORMAL LOW (ref 1.7–2.4)

## 2020-01-30 LAB — FERRITIN: Ferritin: 420 ng/mL — ABNORMAL HIGH (ref 24–336)

## 2020-01-30 LAB — PHOSPHORUS: Phosphorus: 2.9 mg/dL (ref 2.5–4.6)

## 2020-01-30 LAB — C-REACTIVE PROTEIN: CRP: 6.3 mg/dL — ABNORMAL HIGH (ref <1.0)

## 2020-01-30 MED ORDER — POTASSIUM CHLORIDE 10 MEQ/100ML IV SOLN
10.0000 meq | INTRAVENOUS | Status: AC
  Administered 2020-01-30 (×4): 10 meq via INTRAVENOUS
  Filled 2020-01-30 (×4): qty 100

## 2020-01-30 MED ORDER — AMLODIPINE BESYLATE 5 MG PO TABS
5.0000 mg | ORAL_TABLET | Freq: Every day | ORAL | Status: DC
  Administered 2020-01-30 – 2020-02-20 (×17): 5 mg via ORAL
  Filled 2020-01-30 (×20): qty 1

## 2020-01-30 MED ORDER — MAGNESIUM SULFATE 2 GM/50ML IV SOLN
2.0000 g | Freq: Once | INTRAVENOUS | Status: AC
  Administered 2020-01-30: 2 g via INTRAVENOUS
  Filled 2020-01-30: qty 50

## 2020-01-30 NOTE — Progress Notes (Signed)
PROGRESS NOTE    Thomas Coffey   QMG:[id number]  DOB: 1955-04-23  PCP: Patient, [name]    DOA: 01/28/2020 LOS: 1   Brief Narrative   Thomas Coffey is a 65 y.o. male with medical history significant of alcohol abuse, tobacco abuse, alcoholic pancreatitis, CAD, myocardial infarction, atrial fibrillation not on anticoagulants, who presented to the ED on 01/28/2020 with 10 days of cough and body aches, in addition to progressive abdominal pain with nausea vomiting since the day before.  Evaluation in the ED revealed acute pancreatitis with lipase 518.  COVID-19 positive.  Liver enzymes elevated.  Chest x-ray showed acute bronchitic changes.     Assessment & Plan   Principal Problem:   Acute respiratory disease due to COVID-19 virus Active Problems:   Alcohol abuse   Hypomagnesemia   Acute pancreatitis   Abnormal LFTs   CAD (coronary artery disease)   Atrial fibrillation, chronic (HCC)   Tobacco abuse   COVID-19   COVID-19 infection -presented with 10-day history of body aches and cough, tested positive in the ED.  Has not been hypoxic.  Chest x-ray showed bronchitic changes. -- Continue remdesivir -- Stopped steroids given no hypoxia -- Monitor inflammatory markers, CMP daily -- Scheduled Mucinex, as needed albuterol -- Vitamin C, zinc -- Continue doxycycline in case of secondary bacterial bronchitis  Acute pancreatitis -present on admission, due to alcohol abuse. Patient reported tolerating clear liquids for breakfast and would like to try advancing diet.   Lipase trended 518>> 184. -- Advance diet to soft -- Continue IV fluids per order -- Pain control per orders -- As needed Zofran  Hypokalemia  / Hypomagnesemia -present on admission.  Both were replaced.   -- Monitor BMP and Mg daily and replace as needed  Alcohol abuse -on CIWA protocol with as needed Ativan.  Scoring high enough to require some Ativan, but otherwise uncomplicated withdrawal thus  far.  Tobacco abuse -nicotine patch.  Counseled on the importance of cessation.  Transaminitis -likely due to alcohol abuse.  Monitor CMP closely since on remdesivir.  CAD -chronic, stable without active chest pain or acute ischemic changes on EKG.   -- Continue aspirin -- Stat EKG and troponins if chest pain occurs  A. fib, chronic -rate slightly high at 100s due to pancreatitis and possibly withdrawal from alcohol.  Not on anticoagulation or rate control agent. -- Monitor on telemetry    DVT prophylaxis: enoxaparin (LOVENOX) injection 40 mg Start: 01/28/20 2200   Diet:  Diet Orders (From admission, onward)    Start     Ordered   01/29/20 1104  DIET SOFT Room service appropriate? Yes; Fluid consistency: Thin  Diet effective now       Question Answer Comment  Room service appropriate? Yes   Fluid consistency: Thin      01/29/20 1103            Code Status: Full Code    Subjective 01/30/20    Patient reports feeling so-so today.  Is having some cough, intermittent mild shortness of breath, but no fevers or chills.  Tolerating soft diet without significant increase in abdominal pain, states he is hungry.  No other acute complaints.   Disposition Plan & Communication   Status is: Inpatient  Remains inpatient appropriate because:IV treatments appropriate due to intensity of illness or inability to take PO   Dispo: The patient is from: Home  Anticipated d/c is to: Home              Anticipated d/c date is: 2 days              Patient currently is not medically stable to d/c.    Consults, Procedures, Significant Events   Consultants:   None  Procedures:   None  Antimicrobials:  Anti-infectives (From admission, onward)   Start     Dose/Rate Route Frequency Ordered Stop   01/29/20 1000  azithromycin (ZITHROMAX) tablet 250 mg  Status:  Discontinued       "Followed by" Linked Group Details   250 mg Oral Daily 01/28/20 1010 01/28/20 1025    01/29/20 1000  remdesivir 100 mg in sodium chloride 0.9 % 100 mL IVPB       "Followed by" Linked Group Details   100 mg 200 mL/hr over 30 Minutes Intravenous Daily 01/28/20 1024 02/02/20 0959   01/28/20 1200  remdesivir 200 mg in sodium chloride 0.9% 250 mL IVPB       "Followed by" Linked Group Details   200 mg 580 mL/hr over 30 Minutes Intravenous Once 01/28/20 1024 01/28/20 1318   01/28/20 1030  doxycycline (VIBRA-TABS) tablet 100 mg        100 mg Oral Every 12 hours 01/28/20 1025     01/28/20 1015  azithromycin (ZITHROMAX) tablet 500 mg  Status:  Discontinued       "Followed by" Linked Group Details   500 mg Oral Daily 01/28/20 1010 01/28/20 1025        Objective   Vitals:   01/29/20 2359 01/30/20 0506 01/30/20 1151 01/30/20 1544  BP: (!) 144/99 (!) 159/103 (!) 148/106 (!) 145/102  Pulse: (!) 105 90 97 (!) 101  Resp: 19 19 16 16   Temp: 98.1 F (36.7 C) 98.2 F (36.8 C) 98.4 F (36.9 C) 98.6 F (37 C)  TempSrc: Oral Oral Oral Oral  SpO2: 99% 99% 100% 99%  Weight:      Height:        Intake/Output Summary (Last 24 hours) at 01/30/2020 1636 Last data filed at 01/29/2020 1816 Gross per 24 hour  Intake 2882.66 ml  Output --  Net 2882.66 ml   Filed Weights   01/28/20 0532  Weight: 77.1 kg    Physical Exam:  General exam: awake but drowsy, alert, no acute distress Respiratory system: On room air, normal respiratory effort, symmetric chest rise. Cardiovascular system: RRR, no pedal edema.   Gastrointestinal system: soft, mild epigastric tenderness on palpation, no rebound tenderness, positive bowel sounds Central nervous system: A&O x3. no gross focal neurologic deficits, normal speech Extremities: moves all, no edema   Labs   Data Reviewed: I have personally reviewed following labs and imaging studies  CBC: Recent Labs  Lab 01/28/20 0724 01/29/20 0418 01/30/20 0602  WBC 10.8* 10.3 9.3  NEUTROABS 9.0* 8.5* 7.7  HGB 16.4 14.8 14.9  HCT 48.5 43.1 42.5  MCV  92.7 91.9 91.6  PLT 183 172 173   Basic Metabolic Panel: Recent Labs  Lab 01/28/20 0724 01/29/20 0418 01/30/20 0602  NA 138 134* 136  K 3.4* 3.5 3.4*  CL 100 101 104  CO2 24 22 24   GLUCOSE 112* 92 97  BUN 7* 9 9  CREATININE 0.62 0.57* 0.55*  CALCIUM 9.2 8.6* 8.6*  MG 1.6* 1.7 1.6*  PHOS  --  3.3 2.9   GFR: Estimated Creatinine Clearance: 93.3 mL/min (A) (by C-G formula based on SCr  of 0.55 mg/dL (L)). Liver Function Tests: Recent Labs  Lab 01/28/20 0724 01/29/20 0418 01/30/20 0602  AST 153*  146* 94* 106*  ALT 39  37 28 30  ALKPHOS 215*  213* 171* 189*  BILITOT 1.7*  1.8* 1.9* 1.8*  PROT 7.4  7.3 6.6 6.3*  ALBUMIN 3.1*  3.0* 2.6* 2.4*   Recent Labs  Lab 01/28/20 0724 01/29/20 0418  LIPASE 518* 184*   No results for input(s): AMMONIA in the last 168 hours. Coagulation Profile: No results for input(s): INR, PROTIME in the last 168 hours. Cardiac Enzymes: No results for input(s): CKTOTAL, CKMB, CKMBINDEX, TROPONINI in the last 168 hours. BNP (last 3 results) No results for input(s): PROBNP in the last 8760 hours. HbA1C: No results for input(s): HGBA1C in the last 72 hours. CBG: No results for input(s): GLUCAP in the last 168 hours. Lipid Profile: Recent Labs    01/28/20 0724  TRIG 84   Thyroid Function Tests: No results for input(s): TSH, T4TOTAL, FREET4, T3FREE, THYROIDAB in the last 72 hours. Anemia Panel: Recent Labs    01/29/20 0418 01/30/20 0602  FERRITIN 429* 420*   Sepsis Labs: Recent Labs  Lab 01/28/20 1310  PROCALCITON 0.11    Recent Results (from the past 240 hour(s))  SARS CORONAVIRUS 2 (TAT 6-24 HRS) Nasopharyngeal Nasopharyngeal Swab     Status: Abnormal   Collection Time: 01/22/20  5:35 PM   Specimen: Nasopharyngeal Swab  Result Value Ref Range Status   SARS Coronavirus 2 POSITIVE (A) NEGATIVE Final    Comment: (NOTE) SARS-CoV-2 target nucleic acids are DETECTED.  The SARS-CoV-2 RNA is generally detectable in upper  and lower respiratory specimens during the acute phase of infection. Positive results are indicative of the presence of SARS-CoV-2 RNA. Clinical correlation with patient history and other diagnostic information is  necessary to determine patient infection status. Positive results do not rule out bacterial infection or co-infection with other viruses.  The expected result is Negative.  Fact Sheet for Patients: HairSlick.no  Fact Sheet for Healthcare Providers: quierodirigir.com  This test is not yet approved or cleared by the [place] FDA and  has been authorized for detection and/or diagnosis of SARS-CoV-2 by FDA under an Emergency Use Authorization (EUA). This EUA will remain  in effect (meaning this test can be used) for the duration of the COVID-19 declaration under Section 564(b)(1) of the Act, 21 U. S.C. section 360bbb-3(b)(1), unless the authorization is terminated or revoked sooner.   Performed at [facility], [address], [place], [place] [address]       Imaging Studies   No results found.   Medications   Scheduled Meds: . amLODipine  5 mg Oral Daily  . vitamin C  500 mg Oral Daily  . aspirin EC  81 mg Oral Daily  . dextromethorphan-guaiFENesin  1 tablet Oral BID  . doxycycline  100 mg Oral Q12H  . enoxaparin (LOVENOX) injection  40 mg Subcutaneous Q24H  . folic acid  1 mg Oral Daily  . ipratropium  2 puff Inhalation Q6H  . LORazepam  0-4 mg Intravenous Q12H  . multivitamin with minerals  1 tablet Oral Daily  . nicotine  21 mg Transdermal Daily  . thiamine  100 mg Oral Daily   Or  . thiamine  100 mg Intravenous Daily  . zinc sulfate  220 mg Oral Daily   Continuous Infusions: . sodium chloride 125 mL/hr at 01/30/20 0817  . remdesivir 100 mg in NS  100 mL Stopped (01/30/20 0900)       LOS: 1 day    Time spent: 25 minutes with greater than 50% spent at bedside in coordination of  care    Pennie Banter, DO [facility]  01/30/2020, 4:36 PM    If 7PM-7AM, please contact night-coverage. How to contact the [facility] Attending or Consulting provider 7A - 7P or covering provider during after hours 7P -7A, for this patient?    1. Check the care team in [facility] and look for a) attending/consulting TRH provider listed and b) the [facility] team listed 2. Log into [email or web address] and use [place]'s universal password to access. If you do not have the password, please contact the hospital operator. 3. Locate the [facility] provider you are looking for under [facility] and page to a number that you can be directly reached. 4. If you still have difficulty reaching the provider, please page the [facility] (Director on Call) for the Hospitalists listed on amion for assistance.

## 2020-01-30 NOTE — Progress Notes (Signed)
No PCP listed in chart. Patient should have an assigned PCP through Medicaid.  Thomas Coffey, [place] [phone]

## 2020-01-31 DIAGNOSIS — K852 Alcohol induced acute pancreatitis without necrosis or infection: Secondary | ICD-10-CM | POA: Diagnosis not present

## 2020-01-31 DIAGNOSIS — U071 COVID-19: Secondary | ICD-10-CM | POA: Diagnosis not present

## 2020-01-31 DIAGNOSIS — R945 Abnormal results of liver function studies: Secondary | ICD-10-CM | POA: Diagnosis not present

## 2020-01-31 LAB — CBC WITH DIFFERENTIAL/PLATELET
Abs Immature Granulocytes: 0.05 10*3/uL (ref 0.00–0.07)
Basophils Absolute: 0 10*3/uL (ref 0.0–0.1)
Basophils Relative: 0 %
Eosinophils Absolute: 0 10*3/uL (ref 0.0–0.5)
Eosinophils Relative: 1 %
HCT: 42.2 % (ref 39.0–52.0)
Hemoglobin: 14.6 g/dL (ref 13.0–17.0)
Immature Granulocytes: 1 %
Lymphocytes Relative: 5 %
Lymphs Abs: 0.4 10*3/uL — ABNORMAL LOW (ref 0.7–4.0)
MCH: 31.8 pg (ref 26.0–34.0)
MCHC: 34.6 g/dL (ref 30.0–36.0)
MCV: 91.9 fL (ref 80.0–100.0)
Monocytes Absolute: 0.8 10*3/uL (ref 0.1–1.0)
Monocytes Relative: 9 %
Neutro Abs: 7.1 10*3/uL (ref 1.7–7.7)
Neutrophils Relative %: 84 %
Platelets: 175 10*3/uL (ref 150–400)
RBC: 4.59 MIL/uL (ref 4.22–5.81)
RDW: 13.7 % (ref 11.5–15.5)
WBC: 8.4 10*3/uL (ref 4.0–10.5)
nRBC: 0 % (ref 0.0–0.2)

## 2020-01-31 LAB — COMPREHENSIVE METABOLIC PANEL
ALT: 38 U/L (ref 0–44)
AST: 125 U/L — ABNORMAL HIGH (ref 15–41)
Albumin: 2.4 g/dL — ABNORMAL LOW (ref 3.5–5.0)
Alkaline Phosphatase: 162 U/L — ABNORMAL HIGH (ref 38–126)
Anion gap: 11 (ref 5–15)
BUN: 7 mg/dL — ABNORMAL LOW (ref 8–23)
CO2: 21 mmol/L — ABNORMAL LOW (ref 22–32)
Calcium: 8.6 mg/dL — ABNORMAL LOW (ref 8.9–10.3)
Chloride: 103 mmol/L (ref 98–111)
Creatinine, Ser: 0.48 mg/dL — ABNORMAL LOW (ref 0.61–1.24)
GFR, Estimated: >60 mL/min (ref >60)
Glucose, Bld: 60 mg/dL — ABNORMAL LOW (ref 70–99)
Potassium: 3.1 mmol/L — ABNORMAL LOW (ref 3.5–5.1)
Sodium: 135 mmol/L (ref 135–145)
Total Bilirubin: 2.1 mg/dL — ABNORMAL HIGH (ref 0.3–1.2)
Total Protein: 5.9 g/dL — ABNORMAL LOW (ref 6.5–8.1)

## 2020-01-31 LAB — LIPASE, BLOOD: Lipase: 59 U/L — ABNORMAL HIGH (ref 11–51)

## 2020-01-31 LAB — MAGNESIUM: Magnesium: 1.6 mg/dL — ABNORMAL LOW (ref 1.7–2.4)

## 2020-01-31 LAB — FERRITIN: Ferritin: 419 ng/mL — ABNORMAL HIGH (ref 24–336)

## 2020-01-31 LAB — C-REACTIVE PROTEIN: CRP: 8.4 mg/dL — ABNORMAL HIGH (ref <1.0)

## 2020-01-31 LAB — FIBRIN DERIVATIVES D-DIMER (ARMC ONLY): Fibrin derivatives D-dimer (ARMC): 1412.51 ng/mL (FEU) — ABNORMAL HIGH (ref 0.00–499.00)

## 2020-01-31 MED ORDER — LORAZEPAM 1 MG PO TABS
1.0000 mg | ORAL_TABLET | ORAL | Status: DC | PRN
  Administered 2020-01-31 (×2): 4 mg via ORAL
  Administered 2020-02-01: 16:00:00 2 mg via ORAL
  Administered 2020-02-02: 08:00:00 4 mg via ORAL
  Filled 2020-01-31: qty 4
  Filled 2020-01-31: qty 2
  Filled 2020-01-31: qty 4

## 2020-01-31 MED ORDER — MAGNESIUM OXIDE 400 (241.3 MG) MG PO TABS
400.0000 mg | ORAL_TABLET | Freq: Two times a day (BID) | ORAL | Status: DC
  Administered 2020-01-31 – 2020-02-26 (×38): 400 mg via ORAL
  Filled 2020-01-31 (×45): qty 1

## 2020-01-31 MED ORDER — LORAZEPAM 2 MG/ML IJ SOLN: 1.0000 mg | INTRAMUSCULAR | Status: DC | PRN

## 2020-01-31 MED ORDER — POTASSIUM CHLORIDE CRYS ER 20 MEQ PO TBCR
40.0000 meq | EXTENDED_RELEASE_TABLET | ORAL | Status: AC
Start: 2020-01-31 — End: 2020-01-31
  Administered 2020-01-31 (×2): 40 meq via ORAL
  Filled 2020-01-31 (×2): qty 2

## 2020-01-31 MED ORDER — CHLORDIAZEPOXIDE HCL 25 MG PO CAPS
50.0000 mg | ORAL_CAPSULE | Freq: Four times a day (QID) | ORAL | Status: DC
  Administered 2020-01-31 (×3): 50 mg via ORAL
  Filled 2020-01-31 (×4): qty 2

## 2020-01-31 MED ORDER — MAGNESIUM SULFATE 4 GM/100ML IV SOLN: 4.0000 g | Freq: Once | INTRAVENOUS | Status: DC

## 2020-01-31 NOTE — Progress Notes (Signed)
PROGRESS NOTE    Thomas Coffey   OIZ:[id number]  DOB: Feb 15, 1955  PCP: Patient, [name]    DOA: 01/28/2020 LOS: 2   Brief Narrative   Thomas Coffey is a 65 y.o. male with medical history significant of alcohol abuse, tobacco abuse, alcoholic pancreatitis, CAD, myocardial infarction, atrial fibrillation not on anticoagulants, who presented to the ED on 01/28/2020 with 10 days of cough and body aches, in addition to progressive abdominal pain with nausea vomiting since the day before.  Evaluation in the ED revealed acute pancreatitis with lipase 518.  COVID-19 positive.  Liver enzymes elevated.  Chest x-ray showed acute bronchitic changes.     Assessment & Plan   Principal Problem:   Acute respiratory disease due to COVID-19 virus Active Problems:   Alcohol abuse   Hypomagnesemia   Acute pancreatitis   Abnormal LFTs   CAD (coronary artery disease)   Atrial fibrillation, chronic (HCC)   Tobacco abuse   COVID-19   COVID-19 infection -presented with 10-day history of body aches and cough, tested positive in the ED.  Has not been hypoxic.  Chest x-ray showed bronchitic changes. -- Continue remdesivir -- Stopped steroids given no hypoxia -- Monitor inflammatory markers, CMP daily -- Scheduled Mucinex, as needed albuterol -- Vitamin C, zinc -- Continue doxycycline in case of secondary bacterial bronchitis  Acute pancreatitis -present on admission, due to alcohol abuse.  Continues with epigastric pain worse with eating. Lipase trended 518>> 184>>59. -- Soft diet -- Continue IV fluids  -- Pain control per orders -- As needed Zofran  Hypokalemia  / Hypomagnesemia -present on admission.  Both were replaced.   -- Monitor BMP and Mg daily and replace as needed  Alcohol abuse -on CIWA protocol with as needed Ativan. Start Librium 50 mg TID to reduce need for Ativan hopefully. Overall uncomplicated withdrawal thus far.  Tobacco abuse -nicotine patch.  Counseled on the  importance of cessation.  Transaminitis -likely due to alcohol abuse.  Monitor CMP closely since on remdesivir.  CAD -chronic, stable without active chest pain or acute ischemic changes on EKG.   -- Continue aspirin -- Stat EKG and troponins if chest pain occurs  A. fib, chronic -rate slightly high at 100s due to pancreatitis and possibly withdrawal from alcohol.  Not on anticoagulation or rate control agent. -- Monitor on telemetry    DVT prophylaxis: enoxaparin (LOVENOX) injection 40 mg Start: 01/28/20 2200   Diet:  Diet Orders (From admission, onward)    Start     Ordered   01/29/20 1104  DIET SOFT Room service appropriate? Yes; Fluid consistency: Thin  Diet effective now       Question Answer Comment  Room service appropriate? Yes   Fluid consistency: Thin      01/29/20 1103            Code Status: Full Code    Subjective 01/31/20    Patient dressed in his clothes and shoes this AM and wanting to leave per nursing.  Pt wants to go to his car.  Tachycardic with high CIWA scores.  Pt tolerating a little food, he says in small amounts.  Continues to have a cough.     Disposition Plan & Communication   Status is: Inpatient  Remains inpatient appropriate because:IV treatments appropriate due to intensity of illness or inability to take PO.     Dispo: The patient is from: Home  Anticipated d/c is to: Home              Anticipated d/c date is: 2 days              Patient currently is not medically stable to d/c.    Consults, Procedures, Significant Events   Consultants:   None  Procedures:   None  Antimicrobials:  Anti-infectives (From admission, onward)   Start     Dose/Rate Route Frequency Ordered Stop   01/29/20 1000  azithromycin (ZITHROMAX) tablet 250 mg  Status:  Discontinued       "Followed by" Linked Group Details   250 mg Oral Daily 01/28/20 1010 01/28/20 1025   01/29/20 1000  remdesivir 100 mg in sodium chloride 0.9 % 100 mL IVPB        "Followed by" Linked Group Details   100 mg 200 mL/hr over 30 Minutes Intravenous Daily 01/28/20 1024 02/02/20 0959   01/28/20 1200  remdesivir 200 mg in sodium chloride 0.9% 250 mL IVPB       "Followed by" Linked Group Details   200 mg 580 mL/hr over 30 Minutes Intravenous Once 01/28/20 1024 01/28/20 1318   01/28/20 1030  doxycycline (VIBRA-TABS) tablet 100 mg        100 mg Oral Every 12 hours 01/28/20 1025     01/28/20 1015  azithromycin (ZITHROMAX) tablet 500 mg  Status:  Discontinued       "Followed by" Linked Group Details   500 mg Oral Daily 01/28/20 1010 01/28/20 1025        Objective   Vitals:   01/31/20 0805 01/31/20 1149 01/31/20 1244 01/31/20 1638  BP: 120/80 (!) 136/96 117/86 (!) 135/97  Pulse: 99 (!) 127 (!) 109 97  Resp: 14 16 16 16   Temp: (!) 96.5 F (35.8 C) (!) 97.2 F (36.2 C) 98 F (36.7 C) (!) 96.7 F (35.9 C)  TempSrc:   Axillary   SpO2: 98% 98% 98% (!) 79%  Weight:      Height:        Intake/Output Summary (Last 24 hours) at 01/31/2020 1921 Last data filed at 01/30/2020 2326 Gross per 24 hour  Intake -  Output 200 ml  Net -200 ml   Filed Weights   01/28/20 0532  Weight: 77.1 kg    Physical Exam:  General exam: sleeping, woke easily, appears drowsy, no acute distress Respiratory system: On room air, CTAB, normal work of breathing Cardiovascular system: RRR, no pedal edema.   Gastrointestinal system: soft, epigastric tenderness with mild guarding Central nervous system: A&O x3. no gross focal neurologic deficits, normal speech Extremities: moves all, no edema   Labs   Data Reviewed: I have personally reviewed following labs and imaging studies  CBC: Recent Labs  Lab 01/28/20 0724 01/29/20 0418 01/30/20 0602 01/31/20 0706  WBC 10.8* 10.3 9.3 8.4  NEUTROABS 9.0* 8.5* 7.7 7.1  HGB 16.4 14.8 14.9 14.6  HCT 48.5 43.1 42.5 42.2  MCV 92.7 91.9 91.6 91.9  PLT 183 172 173 175   Basic Metabolic Panel: Recent Labs  Lab  01/28/20 0724 01/29/20 0418 01/30/20 0602 01/31/20 0706  NA 138 134* 136 135  K 3.4* 3.5 3.4* 3.1*  CL 100 101 104 103  CO2 24 22 24  21*  GLUCOSE 112* 92 97 60*  BUN 7* 9 9 7*  CREATININE 0.62 0.57* 0.55* 0.48*  CALCIUM 9.2 8.6* 8.6* 8.6*  MG 1.6* 1.7 1.6* 1.6*  PHOS  --  3.3 2.9  --  GFR: Estimated Creatinine Clearance: 93.3 mL/min (A) (by C-G formula based on SCr of 0.48 mg/dL (L)). Liver Function Tests: Recent Labs  Lab 01/28/20 0724 01/29/20 0418 01/30/20 0602 01/31/20 0706  AST 153*  146* 94* 106* 125*  ALT 39  37 28 30 38  ALKPHOS 215*  213* 171* 189* 162*  BILITOT 1.7*  1.8* 1.9* 1.8* 2.1*  PROT 7.4  7.3 6.6 6.3* 5.9*  ALBUMIN 3.1*  3.0* 2.6* 2.4* 2.4*   Recent Labs  Lab 01/28/20 0724 01/29/20 0418 01/31/20 0706  LIPASE 518* 184* 59*   No results for input(s): AMMONIA in the last 168 hours. Coagulation Profile: No results for input(s): INR, PROTIME in the last 168 hours. Cardiac Enzymes: No results for input(s): CKTOTAL, CKMB, CKMBINDEX, TROPONINI in the last 168 hours. BNP (last 3 results) No results for input(s): PROBNP in the last 8760 hours. HbA1C: No results for input(s): HGBA1C in the last 72 hours. CBG: No results for input(s): GLUCAP in the last 168 hours. Lipid Profile: No results for input(s): CHOL, HDL, LDLCALC, TRIG, CHOLHDL, LDLDIRECT in the last 72 hours. Thyroid Function Tests: No results for input(s): TSH, T4TOTAL, FREET4, T3FREE, THYROIDAB in the last 72 hours. Anemia Panel: Recent Labs    01/30/20 0602 01/31/20 0706  FERRITIN 420* 419*   Sepsis Labs: Recent Labs  Lab 01/28/20 1310  PROCALCITON 0.11    Recent Results (from the past 240 hour(s))  SARS CORONAVIRUS 2 (TAT 6-24 HRS) Nasopharyngeal Nasopharyngeal Swab     Status: Abnormal   Collection Time: 01/22/20  5:35 PM   Specimen: Nasopharyngeal Swab  Result Value Ref Range Status   SARS Coronavirus 2 POSITIVE (A) NEGATIVE Final    Comment: (NOTE) SARS-CoV-2  target nucleic acids are DETECTED.  The SARS-CoV-2 RNA is generally detectable in upper and lower respiratory specimens during the acute phase of infection. Positive results are indicative of the presence of SARS-CoV-2 RNA. Clinical correlation with patient history and other diagnostic information is  necessary to determine patient infection status. Positive results do not rule out bacterial infection or co-infection with other viruses.  The expected result is Negative.  Fact Sheet for Patients: HairSlick.no  Fact Sheet for Healthcare Providers: quierodirigir.com  This test is not yet approved or cleared by the [place] FDA and  has been authorized for detection and/or diagnosis of SARS-CoV-2 by FDA under an Emergency Use Authorization (EUA). This EUA will remain  in effect (meaning this test can be used) for the duration of the COVID-19 declaration under Section 564(b)(1) of the Act, 21 U. S.C. section 360bbb-3(b)(1), unless the authorization is terminated or revoked sooner.   Performed at [facility], [address] [place], [place] [address]       Imaging Studies   No results found.   Medications   Scheduled Meds: . amLODipine  5 mg Oral Daily  . vitamin C  500 mg Oral Daily  . aspirin EC  81 mg Oral Daily  . chlordiazePOXIDE  50 mg Oral QID  . dextromethorphan-guaiFENesin  1 tablet Oral BID  . doxycycline  100 mg Oral Q12H  . enoxaparin (LOVENOX) injection  40 mg Subcutaneous Q24H  . folic acid  1 mg Oral Daily  . ipratropium  2 puff Inhalation Q6H  . magnesium oxide  400 mg Oral BID  . multivitamin with minerals  1 tablet Oral Daily  . nicotine  21 mg Transdermal Daily  . thiamine  100 mg Oral Daily   Or  .  thiamine  100 mg Intravenous Daily  . zinc sulfate  220 mg Oral Daily   Continuous Infusions: . remdesivir 100 mg in NS 100 mL Stopped (01/30/20 0846)       LOS: 2 days    Time  spent: 25 minutes with greater than 50% spent at bedside in coordination of care    Pennie Banter, DO [facility]  01/31/2020, 7:21 PM    If 7PM-7AM, please contact night-coverage. How to contact the [facility] Attending or Consulting provider 7A - 7P or covering provider during after hours 7P -7A, for this patient?    1. Check the care team in [facility] and look for a) attending/consulting TRH provider listed and b) the [facility] team listed 2. Log into [email or web address] and use [place]'s universal password to access. If you do not have the password, please contact the hospital operator. 3. Locate the [facility] provider you are looking for under [facility] and page to a number that you can be directly reached. 4. If you still have difficulty reaching the provider, please page the [facility] (Director on Call) for the Hospitalists listed on amion for assistance.

## 2020-02-01 DIAGNOSIS — K852 Alcohol induced acute pancreatitis without necrosis or infection: Secondary | ICD-10-CM | POA: Diagnosis not present

## 2020-02-01 DIAGNOSIS — J069 Acute upper respiratory infection, unspecified: Secondary | ICD-10-CM | POA: Diagnosis not present

## 2020-02-01 DIAGNOSIS — I482 Chronic atrial fibrillation, unspecified: Secondary | ICD-10-CM | POA: Diagnosis not present

## 2020-02-01 DIAGNOSIS — U071 COVID-19: Secondary | ICD-10-CM | POA: Diagnosis not present

## 2020-02-01 LAB — COMPREHENSIVE METABOLIC PANEL
ALT: 39 U/L (ref 0–44)
AST: 112 U/L — ABNORMAL HIGH (ref 15–41)
Albumin: 2.4 g/dL — ABNORMAL LOW (ref 3.5–5.0)
Alkaline Phosphatase: 159 U/L — ABNORMAL HIGH (ref 38–126)
Anion gap: 10 (ref 5–15)
BUN: 7 mg/dL — ABNORMAL LOW (ref 8–23)
CO2: 20 mmol/L — ABNORMAL LOW (ref 22–32)
Calcium: 8.9 mg/dL (ref 8.9–10.3)
Chloride: 106 mmol/L (ref 98–111)
Creatinine, Ser: 0.56 mg/dL — ABNORMAL LOW (ref 0.61–1.24)
GFR, Estimated: >60 mL/min (ref >60)
Glucose, Bld: 77 mg/dL (ref 70–99)
Potassium: 4.2 mmol/L (ref 3.5–5.1)
Sodium: 136 mmol/L (ref 135–145)
Total Bilirubin: 2.5 mg/dL — ABNORMAL HIGH (ref 0.3–1.2)
Total Protein: 6.2 g/dL — ABNORMAL LOW (ref 6.5–8.1)

## 2020-02-01 LAB — CBC WITH DIFFERENTIAL/PLATELET
Abs Immature Granulocytes: 0.04 10*3/uL (ref 0.00–0.07)
Basophils Absolute: 0 10*3/uL (ref 0.0–0.1)
Basophils Relative: 0 %
Eosinophils Absolute: 0.1 10*3/uL (ref 0.0–0.5)
Eosinophils Relative: 1 %
HCT: 43.7 % (ref 39.0–52.0)
Hemoglobin: 14.8 g/dL (ref 13.0–17.0)
Immature Granulocytes: 0 %
Lymphocytes Relative: 6 %
Lymphs Abs: 0.6 10*3/uL — ABNORMAL LOW (ref 0.7–4.0)
MCH: 31.1 pg (ref 26.0–34.0)
MCHC: 33.9 g/dL (ref 30.0–36.0)
MCV: 91.8 fL (ref 80.0–100.0)
Monocytes Absolute: 1 10*3/uL (ref 0.1–1.0)
Monocytes Relative: 11 %
Neutro Abs: 7.5 10*3/uL (ref 1.7–7.7)
Neutrophils Relative %: 82 %
Platelets: 191 10*3/uL (ref 150–400)
RBC: 4.76 MIL/uL (ref 4.22–5.81)
RDW: 13.9 % (ref 11.5–15.5)
WBC: 9.2 10*3/uL (ref 4.0–10.5)
nRBC: 0 % (ref 0.0–0.2)

## 2020-02-01 LAB — C-REACTIVE PROTEIN: CRP: 13.9 mg/dL — ABNORMAL HIGH (ref <1.0)

## 2020-02-01 LAB — FERRITIN: Ferritin: 444 ng/mL — ABNORMAL HIGH (ref 24–336)

## 2020-02-01 LAB — MAGNESIUM: Magnesium: 1.7 mg/dL (ref 1.7–2.4)

## 2020-02-01 LAB — FIBRIN DERIVATIVES D-DIMER (ARMC ONLY): Fibrin derivatives D-dimer (ARMC): 1365.89 ng/mL (FEU) — ABNORMAL HIGH (ref 0.00–499.00)

## 2020-02-01 MED ORDER — METOPROLOL TARTRATE 25 MG PO TABS
25.0000 mg | ORAL_TABLET | Freq: Two times a day (BID) | ORAL | Status: DC
  Administered 2020-02-01 – 2020-02-26 (×40): 25 mg via ORAL
  Filled 2020-02-01 (×44): qty 1

## 2020-02-01 MED ORDER — CHLORDIAZEPOXIDE HCL 25 MG PO CAPS
50.0000 mg | ORAL_CAPSULE | Freq: Three times a day (TID) | ORAL | Status: DC
  Administered 2020-02-01 – 2020-02-02 (×3): 50 mg via ORAL
  Filled 2020-02-01 (×3): qty 2

## 2020-02-01 NOTE — Progress Notes (Signed)
PROGRESS NOTE    Thomas Coffey   NKN:[id number]  DOB: 26-Sep-1955  PCP: Patient, [name]    DOA: 01/28/2020 LOS: 3   Brief Narrative   Thomas Coffey is a 65 y.o. male with medical history significant of alcohol abuse, tobacco abuse, alcoholic pancreatitis, CAD, myocardial infarction, atrial fibrillation not on anticoagulants, who presented to the ED on 01/28/2020 with 10 days of cough and body aches, in addition to progressive abdominal pain with nausea vomiting since the day before.  Evaluation in the ED revealed acute pancreatitis with lipase 518.  COVID-19 positive.  Liver enzymes elevated.  Chest x-ray showed acute bronchitic changes.     Assessment & Plan   Principal Problem:   Acute respiratory disease due to COVID-19 virus Active Problems:   Alcohol abuse   Hypomagnesemia   Acute pancreatitis   Abnormal LFTs   CAD (coronary artery disease)   Atrial fibrillation, chronic (HCC)   Tobacco abuse   COVID-19   COVID-19 infection -presented with 10-day history of body aches and cough, tested positive in the ED.  Has not been hypoxic.  Chest x-ray showed bronchitic changes. -- Continue remdesivir -- Stopped steroids given no hypoxia -- Monitor inflammatory markers, CMP daily -- Scheduled Mucinex, as needed albuterol -- Vitamin C, zinc -- Completed 5 day course doxycycline for secondary bronchitis  Acute pancreatitis -present on admission, due to alcohol abuse.  Continues with epigastric pain worse with eating.  Lipase trend 518>> 184>>59.   -- Diet as tolerated -- Stop IV fluids  -- Pain control per orders -- As needed Zofran  Sinus tachycardia - due to Etoh withdrawal most likely.  Normal sinus on monitor.    Hypertension - probably related to EtOH withdrawal.  Started on Lopressor given A-fib history and tachycardia.   Hypokalemia  / Hypomagnesemia -present on admission.  Both were replaced.   -- Monitor BMP and Mg daily and replace as needed  Alcohol  abuse -on CIWA protocol with as needed Ativan. Librium 50 mg TID to reduce need for Ativan hopefully. Overall uncomplicated withdrawal thus far.  Tobacco abuse -nicotine patch.  Counseled on the importance of cessation.  Transaminitis -likely due to alcohol abuse.  Monitor CMP closely since on remdesivir.  CAD -chronic, stable without active chest pain or acute ischemic changes on EKG.   -- Continue aspirin -- Stat EKG and troponins if chest pain occurs  A. fib, chronic -rate slightly high at 100s due to pancreatitis and possibly withdrawal from alcohol.  Not on anticoagulation or rate control agent. -- Monitor on telemetry -- started on PO Lopressor for rate and BP    DVT prophylaxis: enoxaparin (LOVENOX) injection 40 mg Start: 01/28/20 2200   Diet:  Diet Orders (From admission, onward)    Start     Ordered   01/31/20 1927  Diet Heart Room service appropriate? Yes; Fluid consistency: Thin  Diet effective now       Question Answer Comment  Room service appropriate? Yes   Fluid consistency: Thin      01/31/20 1927            Code Status: Full Code    Subjective 02/01/20    Patient sleeping with sheet over his head today.  I lifted up the sheet while talking to him, he wouldn't uncover himself.  Says feeling "so so".  Abdominal pain also "so so" but worse after eating sometimes.  Still has cough and feels fatigued.    Per RN,  patient refused many of his meds today.   Disposition Plan & Communication   Status is: Inpatient  Remains inpatient appropriate because:IV treatments appropriate due to intensity of illness or inability to take PO.     Dispo: The patient is from: Home              Anticipated d/c is to: Home              Anticipated d/c date is: 1 day              Patient currently is not medically stable to d/c.    Consults, Procedures, Significant Events   Consultants:   None  Procedures:   None  Antimicrobials:  Anti-infectives (From  admission, onward)   Start     Dose/Rate Route Frequency Ordered Stop   01/29/20 1000  azithromycin (ZITHROMAX) tablet 250 mg  Status:  Discontinued       "Followed by" Linked Group Details   250 mg Oral Daily 01/28/20 1010 01/28/20 1025   01/29/20 1000  remdesivir 100 mg in sodium chloride 0.9 % 100 mL IVPB       "Followed by" Linked Group Details   100 mg 200 mL/hr over 30 Minutes Intravenous Daily 01/28/20 1024 02/02/20 0959   01/28/20 1200  remdesivir 200 mg in sodium chloride 0.9% 250 mL IVPB       "Followed by" Linked Group Details   200 mg 580 mL/hr over 30 Minutes Intravenous Once 01/28/20 1024 01/28/20 1318   01/28/20 1030  doxycycline (VIBRA-TABS) tablet 100 mg        100 mg Oral Every 12 hours 01/28/20 1025     01/28/20 1015  azithromycin (ZITHROMAX) tablet 500 mg  Status:  Discontinued       "Followed by" Linked Group Details   500 mg Oral Daily 01/28/20 1010 01/28/20 1025        Objective   Vitals:   02/01/20 0720 02/01/20 0732 02/01/20 1025 02/01/20 1232  BP: (!) 164/92 (!) 164/92 127/75 107/70  Pulse: (!) 126 (!) 126 (!) 124 (!) 105  Resp:  17 20 18   Temp:   98.2 F (36.8 C) 97.8 F (36.6 C)  TempSrc:      SpO2:  98% 99% 96%  Weight:      Height:       No intake or output data in the 24 hours ending 02/01/20 1418 Filed Weights   01/28/20 0532  Weight: 77.1 kg    Physical Exam:  General exam: sleeping with sheet over head, no acute distress Respiratory system: CTAB, on room air, normal work of breathing Cardiovascular system: RRR, no pedal edema.   Gastrointestinal system: soft, guarding on epigastric palpation, no rebound tenderness Central nervous system: A&O x3. no gross focal neurologic deficits, normal speech Extremities: moves all, no edema   Labs   Data Reviewed: I have personally reviewed following labs and imaging studies  CBC: Recent Labs  Lab 01/28/20 0724 01/29/20 0418 01/30/20 0602 01/31/20 0706 02/01/20 0646  WBC 10.8* 10.3  9.3 8.4 9.2  NEUTROABS 9.0* 8.5* 7.7 7.1 7.5  HGB 16.4 14.8 14.9 14.6 14.8  HCT 48.5 43.1 42.5 42.2 43.7  MCV 92.7 91.9 91.6 91.9 91.8  PLT 183 172 173 175 191   Basic Metabolic Panel: Recent Labs  Lab 01/28/20 0724 01/29/20 0418 01/30/20 0602 01/31/20 0706 02/01/20 0646  NA 138 134* 136 135 136  K 3.4* 3.5 3.4* 3.1* 4.2  CL 100 101 104  103 106  CO2 24 22 24  21* 20*  GLUCOSE 112* 92 97 60* 77  BUN 7* 9 9 7* 7*  CREATININE 0.62 0.57* 0.55* 0.48* 0.56*  CALCIUM 9.2 8.6* 8.6* 8.6* 8.9  MG 1.6* 1.7 1.6* 1.6* 1.7  PHOS  --  3.3 2.9  --   --    GFR: Estimated Creatinine Clearance: 93.3 mL/min (A) (by C-G formula based on SCr of 0.56 mg/dL (L)). Liver Function Tests: Recent Labs  Lab 01/28/20 0724 01/29/20 0418 01/30/20 0602 01/31/20 0706 02/01/20 0646  AST 153*  146* 94* 106* 125* 112*  ALT 39  37 28 30 38 39  ALKPHOS 215*  213* 171* 189* 162* 159*  BILITOT 1.7*  1.8* 1.9* 1.8* 2.1* 2.5*  PROT 7.4  7.3 6.6 6.3* 5.9* 6.2*  ALBUMIN 3.1*  3.0* 2.6* 2.4* 2.4* 2.4*   Recent Labs  Lab 01/28/20 0724 01/29/20 0418 01/31/20 0706  LIPASE 518* 184* 59*   No results for input(s): AMMONIA in the last 168 hours. Coagulation Profile: No results for input(s): INR, PROTIME in the last 168 hours. Cardiac Enzymes: No results for input(s): CKTOTAL, CKMB, CKMBINDEX, TROPONINI in the last 168 hours. BNP (last 3 results) No results for input(s): PROBNP in the last 8760 hours. HbA1C: No results for input(s): HGBA1C in the last 72 hours. CBG: No results for input(s): GLUCAP in the last 168 hours. Lipid Profile: No results for input(s): CHOL, HDL, LDLCALC, TRIG, CHOLHDL, LDLDIRECT in the last 72 hours. Thyroid Function Tests: No results for input(s): TSH, T4TOTAL, FREET4, T3FREE, THYROIDAB in the last 72 hours. Anemia Panel: Recent Labs    01/31/20 0706 02/01/20 0646  FERRITIN 419* 444*   Sepsis Labs: Recent Labs  Lab 01/28/20 1310  PROCALCITON 0.11    Recent  Results (from the past 240 hour(s))  SARS CORONAVIRUS 2 (TAT 6-24 HRS) Nasopharyngeal Nasopharyngeal Swab     Status: Abnormal   Collection Time: 01/22/20  5:35 PM   Specimen: Nasopharyngeal Swab  Result Value Ref Range Status   SARS Coronavirus 2 POSITIVE (A) NEGATIVE Final    Comment: (NOTE) SARS-CoV-2 target nucleic acids are DETECTED.  The SARS-CoV-2 RNA is generally detectable in upper and lower respiratory specimens during the acute phase of infection. Positive results are indicative of the presence of SARS-CoV-2 RNA. Clinical correlation with patient history and other diagnostic information is  necessary to determine patient infection status. Positive results do not rule out bacterial infection or co-infection with other viruses.  The expected result is Negative.  Fact Sheet for Patients: 03/21/20  Fact Sheet for Healthcare Providers: HairSlick.no  This test is not yet approved or cleared by the [place] FDA and  has been authorized for detection and/or diagnosis of SARS-CoV-2 by FDA under an Emergency Use Authorization (EUA). This EUA will remain  in effect (meaning this test can be used) for the duration of the COVID-19 declaration under Section 564(b)(1) of the Act, 21 U. S.C. section 360bbb-3(b)(1), unless the authorization is terminated or revoked sooner.   Performed at [facility], [address] [place], [place]       Imaging Studies   No results found.   Medications   Scheduled Meds: . amLODipine  5 mg Oral Daily  . vitamin C  500 mg Oral Daily  . aspirin EC  81 mg Oral Daily  . chlordiazePOXIDE  50 mg Oral TID  . dextromethorphan-guaiFENesin  1 tablet Oral BID  . doxycycline  100 mg  Oral Q12H  . enoxaparin (LOVENOX) injection  40 mg Subcutaneous Q24H  . folic acid  1 mg Oral Daily  . ipratropium  2 puff Inhalation Q6H  . magnesium oxide  400 mg Oral BID  .  metoprolol tartrate  25 mg Oral BID  . multivitamin with minerals  1 tablet Oral Daily  . nicotine  21 mg Transdermal Daily  . thiamine  100 mg Oral Daily   Or  . thiamine  100 mg Intravenous Daily  . zinc sulfate  220 mg Oral Daily   Continuous Infusions: . remdesivir 100 mg in NS 100 mL 100 mg (02/01/20 1112)       LOS: 3 days    Time spent: 20 minutes    Pennie Banter, DO [facility]  02/01/2020, 2:18 PM    If 7PM-7AM, please contact night-coverage. How to contact the [facility] Attending or Consulting provider 7A - 7P or covering provider during after hours 7P -7A, for this patient?    1. Check the care team in [facility] and look for a) attending/consulting TRH provider listed and b) the [facility] team listed 2. Log into [email or web address] and use [place]'s universal password to access. If you do not have the password, please contact the hospital operator. 3. Locate the [facility] provider you are looking for under [facility] and page to a number that you can be directly reached. 4. If you still have difficulty reaching the provider, please page the [facility] (Director on Call) for the Hospitalists listed on amion for assistance.

## 2020-02-02 DIAGNOSIS — R945 Abnormal results of liver function studies: Secondary | ICD-10-CM | POA: Diagnosis not present

## 2020-02-02 DIAGNOSIS — F1010 Alcohol abuse, uncomplicated: Secondary | ICD-10-CM | POA: Diagnosis not present

## 2020-02-02 DIAGNOSIS — U071 COVID-19: Secondary | ICD-10-CM | POA: Diagnosis not present

## 2020-02-02 DIAGNOSIS — K852 Alcohol induced acute pancreatitis without necrosis or infection: Secondary | ICD-10-CM | POA: Diagnosis not present

## 2020-02-02 MED ORDER — SENNOSIDES-DOCUSATE SODIUM 8.6-50 MG PO TABS
1.0000 | ORAL_TABLET | Freq: Every day | ORAL | Status: DC
  Administered 2020-02-02 – 2020-02-23 (×15): 1 via ORAL
  Filled 2020-02-02 (×18): qty 1

## 2020-02-02 MED ORDER — BISACODYL 5 MG PO TBEC: 5.0000 mg | DELAYED_RELEASE_TABLET | Freq: Every day | ORAL | Status: DC | PRN

## 2020-02-02 MED ORDER — LORAZEPAM 1 MG PO TABS
1.0000 mg | ORAL_TABLET | ORAL | Status: DC | PRN
  Administered 2020-02-02: 1 mg via ORAL
  Filled 2020-02-02: qty 1

## 2020-02-02 MED ORDER — CHLORDIAZEPOXIDE HCL 25 MG PO CAPS
25.0000 mg | ORAL_CAPSULE | Freq: Three times a day (TID) | ORAL | Status: DC
  Administered 2020-02-02 – 2020-02-03 (×2): 25 mg via ORAL
  Filled 2020-02-02 (×2): qty 1

## 2020-02-02 NOTE — Evaluation (Signed)
Physical Therapy Evaluation Patient Details Name: Thomas Coffey MRN: [id number] DOB: 12-12-1955 Today's Date: 02/02/2020   History of Present Illness  Thomas Coffey is a 65 y.o. male with medical history significant of alcohol abuse, tobacco abuse, alcoholic pancreatitis, CAD, myocardial infarction, atrial fibrillation not on anticoagulants, who presented to the ED on 01/28/2020 with 10 days of cough and body aches, in addition to progressive abdominal pain with nausea vomiting since the day before.     Evaluation in the ED revealed acute pancreatitis with lipase 518.  COVID-19 positive.  Liver enzymes elevated.  Chest x-ray showed acute bronchitic changes.Thomas Coffey is a 65 y.o. male with medical history significant of alcohol abuse, tobacco abuse, alcoholic pancreatitis, CAD, myocardial infarction, atrial fibrillation not on anticoagulants, who presented to the ED on 01/28/2020 with 10 days of cough and body aches, in addition to progressive abdominal pain with nausea vomiting since the day before. Evaluation in the ED revealed acute pancreatitis with lipase 518.  COVID-19 positive.  Liver enzymes elevated.  Chest x-ray showed acute bronchitic changes.  Clinical Impression  Pt received in bed with blanket pulled over head. PT provides gentle encouragement for participation with pt appearing confused. Pt is oriented to self & is aware he has covid but does not appear oriented to time & also demonstrates impaired intellectual awareness. Pt requires encouragement for supine>sit with mod assist & HOB elevated. Pt is able to sit EOB & eventually transfers to standing on his own volition but doesn't attempt before despite multimodal cuing. Pt encouraged to attempt ambulation but with decreased ability to follow instructions, but is able to take 2 side steps to R at EOB before stopping & eventually returning to sitting. Pt distracted by fan in room & also reporting he wants to "get out of this place".  Phlebotomist then arrived to attempt blood draw. Pt encouraged to either transfer to recliner with PT assistance or return supine with pt continuing to sit EOB with PT ultimately providing assistance to initiate & complete sit>supine. Pt will benefit from acute PT services to progress gait with LRAD, focus on standing balance & safety with mobility. At this time, recommending STR upon d/c from acute setting to maximize independence with mobility & reduce fall risk prior to return home.    Follow Up Recommendations SNF    Equipment Recommendations  None recommended by PT    Recommendations for Other Services       Precautions / Restrictions Precautions Precautions: Fall Restrictions Weight Bearing Restrictions: No      Mobility  Bed Mobility Overal bed mobility: Needs Assistance Bed Mobility: Supine to Sit;Sit to Supine     Supine to sit: Min assist;HOB elevated;Mod assist Sit to supine: Min assist;HOB elevated   General bed mobility comments: extra time to initiate & participate in movement    Transfers Overall transfer level: Needs assistance Equipment used: 1 person hand held assist Transfers: Sit to/from Stand Sit to Stand: Min assist         General transfer comment: pt with decreased initiation of sit>stand with multimodal cuing, eventually stands on his own accord  Ambulation/Gait Ambulation/Gait assistance: Min assist Gait Distance (Feet): 2 Feet Assistive device: 1 person hand held assist (pt leaning on tray table with other UE) Gait Pattern/deviations: Decreased step length - right;Decreased step length - left;Decreased stride length Gait velocity: decreased   General Gait Details: side steps to R at EOB  Stairs  Wheelchair Mobility    Modified Rankin (Stroke Patients Only)       Balance Overall balance assessment: Needs assistance Sitting-balance support: No upper extremity supported;Feet supported Sitting balance-Leahy Scale:  Fair Sitting balance - Comments: supervision static sitting EOB   Standing balance support: Bilateral upper extremity supported;Single extremity supported;During functional activity Standing balance-Leahy Scale: Poor Standing balance comment: UE support for standing EOB                             Pertinent Vitals/Pain Pain Assessment: Faces Faces Pain Scale: No hurt    Home Living Family/patient expects to be discharged to:: Private residence Living Arrangements: Children                    Prior Function Level of Independence:  (poor historian, unable to provide info)               Hand Dominance        Extremity/Trunk Assessment   Upper Extremity Assessment Upper Extremity Assessment: Generalized weakness    Lower Extremity Assessment Lower Extremity Assessment: Generalized weakness    Cervical / Trunk Assessment Cervical / Trunk Assessment: Kyphotic  Communication   Communication:  (decreased volume, difficulty hearing pt at times)  Cognition Arousal/Alertness: Lethargic Behavior During Therapy: Flat affect;Restless Overall Cognitive Status: No family/caregiver present to determine baseline cognitive functioning Area of Impairment: Orientation;Memory;Attention;Safety/judgement;Following commands;Problem solving;Awareness                 Orientation Level: Time     Following Commands: Follows one step commands inconsistently;Follows one step commands with increased time Safety/Judgement: Decreased awareness of deficits;Decreased awareness of safety Awareness: Emergent;Anticipatory;Intellectual Problem Solving: Decreased initiation;Requires tactile cues;Requires verbal cues        General Comments      Exercises     Assessment/Plan    PT Assessment Patient needs continued PT services  PT Problem List Decreased strength;Decreased mobility;Decreased safety awareness;Decreased coordination;Decreased knowledge of  precautions;Decreased activity tolerance;Decreased cognition;Cardiopulmonary status limiting activity;Decreased balance;Decreased knowledge of use of DME       PT Treatment Interventions DME instruction;Therapeutic activities;Cognitive remediation;Gait training;Therapeutic exercise;Patient/family education;Stair training;Balance training;Functional mobility training;Neuromuscular re-education    PT Goals (Current goals can be found in the Care Plan section)  Acute Rehab PT Goals Patient Stated Goal: "get out of this place" PT Goal Formulation: With patient Time For Goal Achievement: 02/16/20 Potential to Achieve Goals: Fair    Frequency Min 2X/week   Barriers to discharge Inaccessible home environment;Decreased caregiver support unsure of home set up & family support at d/c    Co-evaluation               AM-PAC PT "6 Clicks" Mobility  Outcome Measure Help needed turning from your back to your side while in a flat bed without using bedrails?: A Little Help needed moving from lying on your back to sitting on the side of a flat bed without using bedrails?: A Lot Help needed moving to and from a bed to a chair (including a wheelchair)?: A Lot Help needed standing up from a chair using your arms (e.g., wheelchair or bedside chair)?: A Little Help needed to walk in hospital room?: A Lot Help needed climbing 3-5 steps with a railing? : Total 6 Click Score: 13    End of Session   Activity Tolerance: Patient tolerated treatment well Patient left: in bed;with call bell/phone within reach;with bed alarm set (phlebotomist in room)  Nurse Communication: Mobility status (confusion) PT Visit Diagnosis: Unsteadiness on feet (R26.81);Difficulty in walking, not elsewhere classified (R26.2);Muscle weakness (generalized) (M62.81)    Time: 6073-7106 PT Time Calculation (min) (ACUTE ONLY): 10 min   Charges:   PT Evaluation $PT Eval Moderate Complexity: 1 Mod          Aleda Grana,  PT, DPT 02/02/20, 12:51 PM   Sandi Mariscal 02/02/2020, 12:47 PM

## 2020-02-02 NOTE — Progress Notes (Signed)
PROGRESS NOTE    [facility] [place]   PQD:[id number]  DOB: 1955/12/24  PCP: Patient, [name]    DOA: 01/28/2020 LOS: 4   Brief Narrative   Thomas Coffey is a 65 y.o. male with medical history significant of alcohol abuse, tobacco abuse, alcoholic pancreatitis, CAD, myocardial infarction, atrial fibrillation not on anticoagulants, who presented to the ED on 01/28/2020 with 10 days of cough and body aches, in addition to progressive abdominal pain with nausea vomiting since the day before.  Evaluation in the ED revealed acute pancreatitis with lipase 518.  COVID-19 positive.  Liver enzymes elevated.  Chest x-ray showed acute bronchitic changes.     Assessment & Plan   Principal Problem:   Acute respiratory disease due to COVID-19 virus Active Problems:   Alcohol abuse   Hypomagnesemia   Acute pancreatitis   Abnormal LFTs   CAD (coronary artery disease)   Atrial fibrillation, chronic (HCC)   Tobacco abuse   COVID-19   COVID-19 infection -presented with 10-day history of body aches and cough, tested positive in the ED.  Has not been hypoxic.  Chest x-ray showed bronchitic changes. --Completed remdesivir --Steroids started on admission were stopped given no hypoxia -- Monitor inflammatory markers, CMP daily -- Scheduled Mucinex, as needed albuterol -- Vitamin C, zinc -- Completed 5 day course doxycycline for secondary bronchitis  Acute pancreatitis -present on admission, due to alcohol abuse.  Continues with epigastric pain worse with eating.  Lipase trend 518>> 184>>59.   -- Diet as tolerated -- Stop IV fluids  -- Pain control per orders -- As needed Zofran  Alcohol abuse / Uncomplicated Alcohol Withdrawal -on CIWA protocol with as needed Ativan. -- Reduce Librium to 25 mg TID to reduce need for Ativan hopefully. -- Overall uncomplicated withdrawal thus far. -- Minimize PRN Ativan as much as possible -- Continue thiamine and folic acid supplements  Sinus  tachycardia - due to Etoh withdrawal most likely.  Normal sinus on monitor.    Hypertension - probably related to EtOH withdrawal.  Started on Lopressor given A-fib history and tachycardia.   BP improved.  Monitor.  Hypokalemia  / Hypomagnesemia -present on admission.  Both were replaced.   -- Monitor BMP and Mg daily and replace as needed  Tobacco abuse -nicotine patch.  Counseled on the importance of cessation.  Transaminitis -likely due to alcohol abuse.  Monitor CMP closely since on remdesivir.  CAD -chronic, stable without active chest pain or acute ischemic changes on EKG.   -- Continue aspirin -- Stat EKG and troponins if chest pain occurs  A. fib, chronic -rate slightly high at 100s due to pancreatitis and possibly withdrawal from alcohol.  Not on anticoagulation or rate control agent. -- Monitor on telemetry -- started on PO Lopressor for rate and BP    DVT prophylaxis: enoxaparin (LOVENOX) injection 40 mg Start: 01/28/20 2200   Diet:  Diet Orders (From admission, onward)    Start     Ordered   01/31/20 1927  Diet Heart Room service appropriate? Yes; Fluid consistency: Thin  Diet effective now       Question Answer Comment  Room service appropriate? Yes   Fluid consistency: Thin      01/31/20 1927            Code Status: Full Code    Subjective 02/02/20    Patient sitting up eating breakfast today.  He had high CIWA score earlier, got Ativan and now very drowsy.  Has difficulty staying away.  PT in room as bed alarm had gone off.  Pt says he wants to get out of here.  Says he's feeling okay.  Still some cough.  No N/V.  Tolerating diet.   Disposition Plan & Communication   Status is: Inpatient  Remains inpatient appropriate because:IV treatments appropriate due to intensity of illness or inability to take PO.  Pt unsafe to discharge home and requires SNF placement.  Ongoing alcohol withdrawal requiring close monitoring.   Dispo: The patient is from: Home               Anticipated d/c is to: SNF              Anticipated d/c date is: 1-2 days              Patient currently is not medically stable to d/c.    Consults, Procedures, Significant Events   Consultants:   None  Procedures:   None  Antimicrobials:  Anti-infectives (From admission, onward)   Start     Dose/Rate Route Frequency Ordered Stop   01/29/20 1000  azithromycin (ZITHROMAX) tablet 250 mg  Status:  Discontinued       "Followed by" Linked Group Details   250 mg Oral Daily 01/28/20 1010 01/28/20 1025   01/29/20 1000  remdesivir 100 mg in sodium chloride 0.9 % 100 mL IVPB       "Followed by" Linked Group Details   100 mg 200 mL/hr over 30 Minutes Intravenous Daily 01/28/20 1024 02/02/20 0959   01/28/20 1200  remdesivir 200 mg in sodium chloride 0.9% 250 mL IVPB       "Followed by" Linked Group Details   200 mg 580 mL/hr over 30 Minutes Intravenous Once 01/28/20 1024 01/28/20 1318   01/28/20 1030  doxycycline (VIBRA-TABS) tablet 100 mg  Status:  Discontinued        100 mg Oral Every 12 hours 01/28/20 1025 02/01/20 1425   01/28/20 1015  azithromycin (ZITHROMAX) tablet 500 mg  Status:  Discontinued       "Followed by" Linked Group Details   500 mg Oral Daily 01/28/20 1010 01/28/20 1025        Objective   Vitals:   02/01/20 1232 02/01/20 1610 02/01/20 2020 02/02/20 0337  BP: 107/70 138/86 (!) 141/82 107/77  Pulse: (!) 105 (!) 102 88 (!) 102  Resp: 18 19 20 16   Temp: 97.8 F (36.6 C) 97.8 F (36.6 C)  97.7 F (36.5 C)  TempSrc:    Oral  SpO2: 96% 98% 99% 99%  Weight:      Height:       No intake or output data in the 24 hours ending 02/02/20 1410 Filed Weights   01/28/20 0532  Weight: 77.1 kg    Physical Exam:  General exam: awake but drowsy, no acute distress Respiratory system: symmetric chest rise, on room air, normal work of breathing Cardiovascular system: RRR, no pedal edema.   Gastrointestinal system: soft, non-tender, no rebound  tenderness Central nervous system: A&O x3. no gross focal neurologic deficits, slow but normal speech    Labs   Data Reviewed: I have personally reviewed following labs and imaging studies  CBC: Recent Labs  Lab 01/28/20 0724 01/29/20 0418 01/30/20 0602 01/31/20 0706 02/01/20 0646  WBC 10.8* 10.3 9.3 8.4 9.2  NEUTROABS 9.0* 8.5* 7.7 7.1 7.5  HGB 16.4 14.8 14.9 14.6 14.8  HCT 48.5 43.1 42.5 42.2 43.7  MCV 92.7 91.9 91.6  91.9 91.8  PLT 183 172 173 175 191   Basic Metabolic Panel: Recent Labs  Lab 01/28/20 0724 01/29/20 0418 01/30/20 0602 01/31/20 0706 02/01/20 0646  NA 138 134* 136 135 136  K 3.4* 3.5 3.4* 3.1* 4.2  CL 100 101 104 103 106  CO2 24 22 24  21* 20*  GLUCOSE 112* 92 97 60* 77  BUN 7* 9 9 7* 7*  CREATININE 0.62 0.57* 0.55* 0.48* 0.56*  CALCIUM 9.2 8.6* 8.6* 8.6* 8.9  MG 1.6* 1.7 1.6* 1.6* 1.7  PHOS  --  3.3 2.9  --   --    GFR: Estimated Creatinine Clearance: 93.3 mL/min (A) (by C-G formula based on SCr of 0.56 mg/dL (L)). Liver Function Tests: Recent Labs  Lab 01/28/20 0724 01/29/20 0418 01/30/20 0602 01/31/20 0706 02/01/20 0646  AST 153*  146* 94* 106* 125* 112*  ALT 39  37 28 30 38 39  ALKPHOS 215*  213* 171* 189* 162* 159*  BILITOT 1.7*  1.8* 1.9* 1.8* 2.1* 2.5*  PROT 7.4  7.3 6.6 6.3* 5.9* 6.2*  ALBUMIN 3.1*  3.0* 2.6* 2.4* 2.4* 2.4*   Recent Labs  Lab 01/28/20 0724 01/29/20 0418 01/31/20 0706  LIPASE 518* 184* 59*   No results for input(s): AMMONIA in the last 168 hours. Coagulation Profile: No results for input(s): INR, PROTIME in the last 168 hours. Cardiac Enzymes: No results for input(s): CKTOTAL, CKMB, CKMBINDEX, TROPONINI in the last 168 hours. BNP (last 3 results) No results for input(s): PROBNP in the last 8760 hours. HbA1C: No results for input(s): HGBA1C in the last 72 hours. CBG: No results for input(s): GLUCAP in the last 168 hours. Lipid Profile: No results for input(s): CHOL, HDL, LDLCALC, TRIG, CHOLHDL,  LDLDIRECT in the last 72 hours. Thyroid Function Tests: No results for input(s): TSH, T4TOTAL, FREET4, T3FREE, THYROIDAB in the last 72 hours. Anemia Panel: Recent Labs    01/31/20 0706 02/01/20 0646  FERRITIN 419* 444*   Sepsis Labs: Recent Labs  Lab 01/28/20 1310  PROCALCITON 0.11    No results found for this or any previous visit (from the past 240 hour(s)).    Imaging Studies   No results found.   Medications   Scheduled Meds: . amLODipine  5 mg Oral Daily  . vitamin C  500 mg Oral Daily  . aspirin EC  81 mg Oral Daily  . chlordiazePOXIDE  25 mg Oral TID  . dextromethorphan-guaiFENesin  1 tablet Oral BID  . enoxaparin (LOVENOX) injection  40 mg Subcutaneous Q24H  . folic acid  1 mg Oral Daily  . ipratropium  2 puff Inhalation Q6H  . magnesium oxide  400 mg Oral BID  . metoprolol tartrate  25 mg Oral BID  . multivitamin with minerals  1 tablet Oral Daily  . nicotine  21 mg Transdermal Daily  . thiamine  100 mg Oral Daily   Or  . thiamine  100 mg Intravenous Daily  . zinc sulfate  220 mg Oral Daily   Continuous Infusions:      LOS: 4 days    Time spent: 30 minutes with > 50% spent in coordination of care and at bedside.    03/27/20, DO [facility]  02/02/2020, 2:10 PM    If 7PM-7AM, please contact night-coverage. How to contact the [facility] Attending or Consulting provider 7A - 7P or covering provider during after hours 7P -7A, for this patient?    1. Check the care team in [facility]  and look for a) attending/consulting [facility] provider listed and b) the [facility] team listed 2. Log into [email or web address] and use [facility] universal password to access. If you do not have the password, please contact the hospital operator. 3. Locate the [facility] provider you are looking for under [facility] and page to a number that you can be directly reached. 4. If you still have difficulty reaching the provider, please page the [facility] (Director on Call) for the  Hospitalists listed on amion for assistance.

## 2020-02-03 ENCOUNTER — Inpatient Hospital Stay: Payer: Medicaid Other

## 2020-02-03 DIAGNOSIS — F1010 Alcohol abuse, uncomplicated: Secondary | ICD-10-CM | POA: Diagnosis not present

## 2020-02-03 DIAGNOSIS — R945 Abnormal results of liver function studies: Secondary | ICD-10-CM | POA: Diagnosis not present

## 2020-02-03 DIAGNOSIS — U071 COVID-19: Secondary | ICD-10-CM | POA: Diagnosis not present

## 2020-02-03 DIAGNOSIS — J069 Acute upper respiratory infection, unspecified: Secondary | ICD-10-CM | POA: Diagnosis not present

## 2020-02-03 LAB — COMPREHENSIVE METABOLIC PANEL
ALT: 55 U/L — ABNORMAL HIGH (ref 0–44)
AST: 138 U/L — ABNORMAL HIGH (ref 15–41)
Albumin: 2.5 g/dL — ABNORMAL LOW (ref 3.5–5.0)
Alkaline Phosphatase: 187 U/L — ABNORMAL HIGH (ref 38–126)
Anion gap: 17 — ABNORMAL HIGH (ref 5–15)
BUN: 9 mg/dL (ref 8–23)
CO2: 19 mmol/L — ABNORMAL LOW (ref 22–32)
Calcium: 9.3 mg/dL (ref 8.9–10.3)
Chloride: 104 mmol/L (ref 98–111)
Creatinine, Ser: 0.62 mg/dL (ref 0.61–1.24)
GFR, Estimated: >60 mL/min (ref >60)
Glucose, Bld: 68 mg/dL — ABNORMAL LOW (ref 70–99)
Potassium: 3.8 mmol/L (ref 3.5–5.1)
Sodium: 140 mmol/L (ref 135–145)
Total Bilirubin: 3.6 mg/dL — ABNORMAL HIGH (ref 0.3–1.2)
Total Protein: 6.8 g/dL (ref 6.5–8.1)

## 2020-02-03 LAB — LIPASE, BLOOD: Lipase: 49 U/L (ref 11–51)

## 2020-02-03 LAB — C-REACTIVE PROTEIN: CRP: 16.5 mg/dL — ABNORMAL HIGH (ref <1.0)

## 2020-02-03 LAB — PROCALCITONIN: Procalcitonin: 0.94 ng/mL

## 2020-02-03 LAB — AMMONIA: Ammonia: 52 umol/L — ABNORMAL HIGH (ref 9–35)

## 2020-02-03 LAB — VITAMIN B12: Vitamin B-12: 258 pg/mL (ref 180–914)

## 2020-02-03 LAB — MAGNESIUM: Magnesium: 1.9 mg/dL (ref 1.7–2.4)

## 2020-02-03 LAB — FERRITIN: Ferritin: 676 ng/mL — ABNORMAL HIGH (ref 24–336)

## 2020-02-03 MED ORDER — LORAZEPAM 2 MG PO TABS: 0.0000 mg | ORAL_TABLET | Freq: Four times a day (QID) | ORAL | Status: DC

## 2020-02-03 MED ORDER — LORAZEPAM 2 MG PO TABS: 0.0000 mg | ORAL_TABLET | Freq: Two times a day (BID) | ORAL | Status: DC

## 2020-02-03 MED ORDER — AMOXICILLIN-POT CLAVULANATE 875-125 MG PO TABS
1.0000 | ORAL_TABLET | Freq: Two times a day (BID) | ORAL | Status: DC
  Administered 2020-02-03 – 2020-02-04 (×3): 1 via ORAL
  Filled 2020-02-03 (×2): qty 1

## 2020-02-03 MED ORDER — DEXTROSE-NACL 5-0.9 % IV SOLN: INTRAVENOUS | Status: DC

## 2020-02-03 MED ORDER — THIAMINE HCL 100 MG/ML IJ SOLN
500.0000 mg | Freq: Three times a day (TID) | INTRAVENOUS | Status: AC
  Administered 2020-02-03 – 2020-02-06 (×8): 500 mg via INTRAVENOUS
  Filled 2020-02-03 (×9): qty 5

## 2020-02-03 MED ORDER — LORAZEPAM 2 MG PO TABS: 0.0000 mg | ORAL_TABLET | ORAL | Status: DC | PRN

## 2020-02-03 MED ORDER — IPRATROPIUM BROMIDE HFA 17 MCG/ACT IN AERS
2.0000 | INHALATION_SPRAY | Freq: Four times a day (QID) | RESPIRATORY_TRACT | Status: DC | PRN
  Filled 2020-02-03: qty 12.9

## 2020-02-03 MED ORDER — LORAZEPAM 2 MG/ML IJ SOLN
0.0000 mg | INTRAMUSCULAR | Status: AC
  Administered 2020-02-03 (×2): 2 mg via INTRAVENOUS
  Administered 2020-02-03: 14:00:00 3 mg via INTRAVENOUS
  Administered 2020-02-04 (×2): 4 mg via INTRAVENOUS
  Administered 2020-02-04: 13:00:00 1 mg via INTRAVENOUS
  Administered 2020-02-04: 2 mg via INTRAVENOUS
  Filled 2020-02-03: qty 2
  Filled 2020-02-03: qty 1
  Filled 2020-02-03 (×2): qty 2
  Filled 2020-02-03 (×2): qty 1
  Filled 2020-02-03: qty 2
  Filled 2020-02-03: qty 1

## 2020-02-03 MED ORDER — CHLORDIAZEPOXIDE HCL 25 MG PO CAPS
50.0000 mg | ORAL_CAPSULE | Freq: Three times a day (TID) | ORAL | Status: DC
  Administered 2020-02-03 – 2020-02-04 (×3): 50 mg via ORAL
  Filled 2020-02-03 (×3): qty 2

## 2020-02-03 MED ORDER — LORAZEPAM 2 MG PO TABS
0.0000 mg | ORAL_TABLET | Freq: Two times a day (BID) | ORAL | Status: DC
  Administered 2020-02-07: 08:00:00 1 mg via ORAL
  Administered 2020-02-08: 2 mg via ORAL
  Filled 2020-02-03 (×2): qty 1

## 2020-02-03 MED ORDER — LORAZEPAM 2 MG/ML IJ SOLN
0.0000 mg | Freq: Two times a day (BID) | INTRAMUSCULAR | Status: AC
  Administered 2020-02-05 (×2): 3 mg via INTRAVENOUS
  Administered 2020-02-06: 14:00:00 2 mg via INTRAVENOUS
  Administered 2020-02-06: 1 mg via INTRAVENOUS
  Filled 2020-02-03: qty 2
  Filled 2020-02-03: qty 1
  Filled 2020-02-03: qty 2
  Filled 2020-02-03: qty 1

## 2020-02-03 MED ORDER — GABAPENTIN 600 MG PO TABS
300.0000 mg | ORAL_TABLET | Freq: Three times a day (TID) | ORAL | Status: DC
  Administered 2020-02-03 – 2020-02-08 (×10): 300 mg via ORAL
  Filled 2020-02-03 (×11): qty 1

## 2020-02-03 MED ORDER — PHENOBARBITAL SODIUM 130 MG/ML IJ SOLN
130.0000 mg | Freq: Once | INTRAMUSCULAR | Status: AC
  Administered 2020-02-03: 16:00:00 130 mg via INTRAVENOUS
  Filled 2020-02-03: qty 1

## 2020-02-03 NOTE — Progress Notes (Addendum)
PROGRESS NOTE    Thomas Coffey   QRF:[id number]  DOB: Oct 07, 1955  PCP: Patient, [name]    DOA: 01/28/2020 LOS: 5   Brief Narrative   Thomas Coffey is a 65 y.o. male with medical history significant of alcohol abuse, tobacco abuse, alcoholic pancreatitis, CAD, myocardial infarction, atrial fibrillation not on anticoagulants, who presented to the ED on 01/28/2020 with 10 days of cough and body aches, in addition to progressive abdominal pain with nausea vomiting since the day before.  Evaluation in the ED revealed acute pancreatitis with lipase 518.  COVID-19 positive.  Liver enzymes elevated.  Chest x-ray showed acute bronchitic changes.     Assessment & Plan   Principal Problem:   Acute respiratory disease due to COVID-19 virus Active Problems:   Alcohol abuse   Hypomagnesemia   Acute pancreatitis   Abnormal LFTs   CAD (coronary artery disease)   Atrial fibrillation, chronic (HCC)   Tobacco abuse   COVID-19   Acute metabolic encephalopathy -most likely due to alcohol withdrawal.  Patient has no focal motor deficits or other strokelike symptoms.  CT head was negative for acute findings, showed mild diffuse cortical atrophy and mild small vessel ischemic changes. -- MRI brain pending -- Consider neurology consult if not improving -- Adjustments to alcohol withdrawal management as below including high-dose thiamine -- Follow-up pending ammonia, B12, thiamine, RPR levels  Alcohol abuse / Uncomplicated Alcohol Withdrawal -withdrawal course ongoing, somewhat prolonged at this point.   Son reports pt very heavy drinker, all day every day, both beer and liquor for entire adult life. He was not aware of prior withdrawal or if hx of seizure or DT's -- Continue Librium to 50 mg TID, start tapering down once symptoms improving -- Ativan PRN per CIWA protocol -- Add gabapentin -- Single dose phenobarbital today -- Overall uncomplicated withdrawal thus far. -- Minimize PRN  Ativan as much as possible -- High-dose IV thiamine 500 mg IV TID -- Folic acid supplement -- If symptom severity continues to progress, transfer to stepdown for Precedex  ?  Aspiration -full liquid diet for now.  SLP consult for swallow evaluation. Due to rising Pro-Cal 0.11>> 0.94, in the setting of reports by nursing staff patient having difficulty swallowing. --Started Augmentin  -- Aspiration precautions -- Chest x-ray in the morning  Inadequate p.o. intake -due to alcohol withdrawal, sedation from medications. --D5-NS maintenance IV fluids until oral intake improves  Hypoglycemia -due to poor p.o. intake.  On D5-NS as above  Hypokalemia  / Hypomagnesemia -present on admission.  Both were replaced.   -- Monitor BMP and Mg daily and replace as needed  COVID-19 infection -presented with 10-day history of body aches and cough, tested positive in the ED.  Has not been hypoxic.  Chest x-ray showed bronchitic changes. CRP is rising, initially 2.7 >> 13.9, but symptoms have improved. --Completed remdesivir --Steroids started on admission were stopped given no hypoxia -- Monitor inflammatory markers, CMP daily -- Scheduled Mucinex, as needed albuterol -- Vitamin C, zinc -- Completed 5 day course doxycycline for secondary bronchitis  Acute pancreatitis -seems resolved.  Present on admission, due to alcohol abuse.   -- Diet as tolerated, currently on full liquids with difficulty swallowing due to sedation/withdrawal -- Resumed on IV fluids as above given inadequate p.o. intake -- Pain control per orders -- As needed Zofran    Sinus tachycardia - due to Etoh withdrawal most likely.  Normal sinus on monitor.  Started on Lopressor  for this and BP.  Hypertension - probably related to EtOH withdrawal.  Started on Lopressor given A-fib history and tachycardia.   BP improved.  Monitor.  Tobacco abuse -nicotine patch.  Smoking cessation recommended.  Transaminitis -likely due to alcohol  abuse.  Monitor CMP closely since on remdesivir. RUQ ultrasound showed likely hepatic steatosis, gall stones.  CAD -chronic, stable without active chest pain or acute ischemic changes on EKG.   -- Continue aspirin -- Stat EKG and troponins if chest pain occurs  A. fib, chronic - Has been normal sinus on tele, tachycardia related to alcohol withdrawal -- Not on anticoagulation or rate control meds -- Monitor on telemetry -- started on PO Lopressor for rate and BP    DVT prophylaxis: enoxaparin (LOVENOX) injection 40 mg Start: 01/28/20 2200   Diet:  Diet Orders (From admission, onward)    Start     Ordered   02/03/20 1426  Diet NPO time specified Except for: Sips with Meds, Ice Chips  Diet effective now       Question Answer Comment  Except for Sips with Meds   Except for Ice Chips      02/03/20 1425            Code Status: Full Code    Subjective 02/03/20    Patient continues to withdraw, having high CIWA scores.  Pt drowsy due to Ativan given earlier.  He denies pain.  Says hungry.  Per nursing, is unable to eat or swallow safely, SLP eval requested.     Disposition Plan & Communication   Status is: Inpatient  Remains inpatient appropriate because:IV treatments appropriate due to intensity of illness or inability to take PO.  Ongoing alcohol withdrawal requiring inpatient management and close monitoring.  Inadequate PO intake.  Requires SNF placement.     Dispo: The patient is from: Home              Anticipated d/c is to: SNF              Anticipated d/c date is: 3 days              Patient currently is not medically stable to d/c.  Family Communication: spoke with pt's son Thomas Coffey by phone this afternoon, 1/18.    Consults, Procedures, Significant Events   Consultants:   None  Procedures:   None  Antimicrobials:  Anti-infectives (From admission, onward)   Start     Dose/Rate Route Frequency Ordered Stop   02/03/20 1015  amoxicillin-clavulanate  (AUGMENTIN) 875-125 MG per tablet 1 tablet        1 tablet Oral Every 12 hours 02/03/20 0929     01/29/20 1000  azithromycin (ZITHROMAX) tablet 250 mg  Status:  Discontinued       "Followed by" Linked Group Details   250 mg Oral Daily 01/28/20 1010 01/28/20 1025   01/29/20 1000  remdesivir 100 mg in sodium chloride 0.9 % 100 mL IVPB       "Followed by" Linked Group Details   100 mg 200 mL/hr over 30 Minutes Intravenous Daily 01/28/20 1024 02/02/20 0959   01/28/20 1200  remdesivir 200 mg in sodium chloride 0.9% 250 mL IVPB       "Followed by" Linked Group Details   200 mg 580 mL/hr over 30 Minutes Intravenous Once 01/28/20 1024 01/28/20 1318   01/28/20 1030  doxycycline (VIBRA-TABS) tablet 100 mg  Status:  Discontinued        100 mg  Oral Every 12 hours 01/28/20 1025 02/01/20 1425   01/28/20 1015  azithromycin (ZITHROMAX) tablet 500 mg  Status:  Discontinued       "Followed by" Linked Group Details   500 mg Oral Daily 01/28/20 1010 01/28/20 1025        Objective   Vitals:   02/01/20 2020 02/02/20 0337 02/02/20 2024 02/03/20 0833  BP: (!) 141/82 107/77 101/71 109/76  Pulse: 88 (!) 102 99 (!) 105  Resp: 20 16 16 17   Temp:  97.7 F (36.5 C) 99.3 F (37.4 C) 97.8 F (36.6 C)  TempSrc:  Oral Oral   SpO2: 99% 99% 100% 98%  Weight:      Height:       No intake or output data in the 24 hours ending 02/03/20 1433 Filed Weights   01/28/20 0532  Weight: 77.1 kg    Physical Exam:  General exam: drowsy, fidgeting, no acute distress, laying in bed Respiratory system: CTAB anteriorly, on room air, normal respiratory effort, no wheezing or rhonchi Cardiovascular system: RRR, no pedal edema   Gastrointestinal system: soft, non-tender, +bowel soudns Central nervous system: no gross focal neurologic deficits, slow but normal speech, unable to assess orientation due to pt not answering  Extremities: moves all, normal tone    Labs   Data Reviewed: I have personally reviewed following  labs and imaging studies  CBC: Recent Labs  Lab 01/28/20 0724 01/29/20 0418 01/30/20 0602 01/31/20 0706 02/01/20 0646  WBC 10.8* 10.3 9.3 8.4 9.2  NEUTROABS 9.0* 8.5* 7.7 7.1 7.5  HGB 16.4 14.8 14.9 14.6 14.8  HCT 48.5 43.1 42.5 42.2 43.7  MCV 92.7 91.9 91.6 91.9 91.8  PLT 183 172 173 175 191   Basic Metabolic Panel: Recent Labs  Lab 01/29/20 0418 01/30/20 0602 01/31/20 0706 02/01/20 0646 02/03/20 0540  NA 134* 136 135 136 140  K 3.5 3.4* 3.1* 4.2 3.8  CL 101 104 103 106 104  CO2 22 24 21* 20* 19*  GLUCOSE 92 97 60* 77 68*  BUN 9 9 7* 7* 9  CREATININE 0.57* 0.55* 0.48* 0.56* 0.62  CALCIUM 8.6* 8.6* 8.6* 8.9 9.3  MG 1.7 1.6* 1.6* 1.7 1.9  PHOS 3.3 2.9  --   --   --    GFR: Estimated Creatinine Clearance: 93.3 mL/min (by C-G formula based on SCr of 0.62 mg/dL). Liver Function Tests: Recent Labs  Lab 01/29/20 0418 01/30/20 0602 01/31/20 0706 02/01/20 0646 02/03/20 0540  AST 94* 106* 125* 112* 138*  ALT 28 30 38 39 55*  ALKPHOS 171* 189* 162* 159* 187*  BILITOT 1.9* 1.8* 2.1* 2.5* 3.6*  PROT 6.6 6.3* 5.9* 6.2* 6.8  ALBUMIN 2.6* 2.4* 2.4* 2.4* 2.5*   Recent Labs  Lab 01/28/20 0724 01/29/20 0418 01/31/20 0706 02/03/20 0540  LIPASE 518* 184* 59* 49   No results for input(s): AMMONIA in the last 168 hours. Coagulation Profile: No results for input(s): INR, PROTIME in the last 168 hours. Cardiac Enzymes: No results for input(s): CKTOTAL, CKMB, CKMBINDEX, TROPONINI in the last 168 hours. BNP (last 3 results) No results for input(s): PROBNP in the last 8760 hours. HbA1C: No results for input(s): HGBA1C in the last 72 hours. CBG: No results for input(s): GLUCAP in the last 168 hours. Lipid Profile: No results for input(s): CHOL, HDL, LDLCALC, TRIG, CHOLHDL, LDLDIRECT in the last 72 hours. Thyroid Function Tests: No results for input(s): TSH, T4TOTAL, FREET4, T3FREE, THYROIDAB in the last 72 hours. Anemia Panel: Recent Labs  02/01/20 0646  02/03/20 0540  FERRITIN 444* 676*   Sepsis Labs: Recent Labs  Lab 01/28/20 1310 02/03/20 0540  PROCALCITON 0.11 0.94    No results found for this or any previous visit (from the past 240 hour(s)).    Imaging Studies   CT HEAD WO CONTRAST  Result Date: 02/03/2020 CLINICAL DATA:  Altered mental status. EXAM: CT HEAD WITHOUT CONTRAST TECHNIQUE: Contiguous axial images were obtained from the base of the skull through the vertex without intravenous contrast. COMPARISON:  July 15, 2005. FINDINGS: Brain: Mild diffuse cortical atrophy is noted. Mild chronic ischemic white matter disease is noted. No mass effect or midline shift is noted. Ventricular size is within normal limits. There is no evidence of mass lesion, hemorrhage or acute infarction. Vascular: No hyperdense vessel or unexpected calcification. Skull: Normal. Negative for fracture or focal lesion. Sinuses/Orbits: No acute finding. Other: None. IMPRESSION: Mild diffuse cortical atrophy. Mild chronic ischemic white matter disease. No acute intracranial abnormality seen. Electronically Signed   By: Lupita Raider M.D.   On: 02/03/2020 12:29   US Abdomen Limited RUQ (LIVER/GB)  Result Date: 02/03/2020 CLINICAL DATA:  Transaminitis EXAM: ULTRASOUND ABDOMEN LIMITED RIGHT UPPER QUADRANT COMPARISON:  CT abdomen pelvis with contrast 07/20/2019 FINDINGS: Gallbladder: Cholelithiasis. No gallbladder wall thickening or pericholecystic fluid. Negative sonographic Murphy sign per technologist. Common bile duct: Diameter: 4 mm Liver: Diffusely echogenic parenchyma. No focal hepatic lesion. Portal vein is patent on color Doppler imaging with normal direction of blood flow towards the liver. Other: None. IMPRESSION: 1. Diffuse increased echogenicity of the hepatic parenchyma is a nonspecific indicator of hepatocellular dysfunction, most commonly steatosis. 2. Cholelithiasis without additional sonographic evidence of acute cholecystitis. Electronically  Signed   By: Acquanetta Belling M.D.   On: 02/03/2020 12:49     Medications   Scheduled Meds: . amLODipine  5 mg Oral Daily  . amoxicillin-clavulanate  1 tablet Oral Q12H  . vitamin C  500 mg Oral Daily  . aspirin EC  81 mg Oral Daily  . chlordiazePOXIDE  50 mg Oral Q8H  . dextromethorphan-guaiFENesin  1 tablet Oral BID  . enoxaparin (LOVENOX) injection  40 mg Subcutaneous Q24H  . folic acid  1 mg Oral Daily  . gabapentin  300 mg Oral TID  . LORazepam  0-4 mg Intravenous Q4H   Followed by  . [START ON 02/05/2020] LORazepam  0-4 mg Intravenous Q12H  . [START ON 02/06/2020] LORazepam  0-4 mg Oral Q12H  . magnesium oxide  400 mg Oral BID  . metoprolol tartrate  25 mg Oral BID  . multivitamin with minerals  1 tablet Oral Daily  . nicotine  21 mg Transdermal Daily  . PHENObarbital  130 mg Intravenous Once  . senna-docusate  1 tablet Oral QHS  . zinc sulfate  220 mg Oral Daily   Continuous Infusions: . dextrose 5 % and 0.9% NaCl    . thiamine injection 500 mg (02/03/20 1034)       LOS: 5 days    Time spent: 40 minutes with > 50% spent in coordination of care and at bedside.    Pennie Banter, DO [facility]  02/03/2020, 2:33 PM    If 7PM-7AM, please contact night-coverage. How to contact the [facility] Attending or Consulting provider 7A - 7P or covering provider during after hours 7P -7A, for this patient?    1. Check the care team in [facility] and look for a) attending/consulting TRH provider listed and b) the TRH  team listed 2. Log into [email or web address] and use [facility]'s universal password to access. If you do not have the password, please contact the hospital operator. 3. Locate the [facility] provider you are looking for under [facility] and page to a number that you can be directly reached. 4. If you still have difficulty reaching the provider, please page the [facility] (Director on Call) for the Hospitalists listed on amion for assistance.

## 2020-02-03 NOTE — TOC Initial Note (Addendum)
Transition of Care [facility]) - Initial/Assessment Note    Patient Details  Name: Thomas Coffey MRN: [id number] Date of Birth: [birth year]  Transition [facility]) CM/SW Contact:    Thomas Cline, LCSW Phone Number: 02/03/2020, 1:36 PM  Clinical Narrative:                Patient disoriented, spoke to RN who reported son Thomas Coffey is main contact. CSW called Thomas Coffey. He reported patient lives with him and typically drives himself to appointments. Thomas Coffey was unsure of patient's PCP, patient should have PCP assigned by Medicaid (listed on Medicaid card). [facility] No SNF, HH, or DME history. Explained PT recommending SNF, Thomas Coffey reported patient would not be willing to go to SNF and that [facility] would like patient to return home with him at discharge. He reported patient may be agreeable to home health, but he is not sure, TOC will need to attempt to discuss with patient when patient is more oriented. Confirmed home address listed in chart. Thomas Coffey reported he will pick patient up at time of discharge.    Expected Discharge Plan: Home w Home Health Services Barriers to Discharge: Continued Medical Work up   Patient Goals and CMS Choice Patient states their goals for this hospitalization and ongoing recovery are:: to return home CMS Medicare.gov Compare Post Acute Care list provided to:: Patient Represenative (must comment) Choice offered to / list presented to : Adult Children  Expected Discharge Plan and Services Expected Discharge Plan: Home w Home Health Services       Living arrangements for the past 2 months: Single Family Home                                      Prior Living Arrangements/Services Living arrangements for the past 2 months: Single Family Home Lives with:: Adult Children Patient language and need for interpreter reviewed:: Yes Do you feel safe going back to the place where you live?: Yes      Need for Family  Participation in Patient Care: Yes (Comment) Care giver support system in place?: Yes (comment)   Criminal Activity/Legal Involvement Pertinent to Current Situation/Hospitalization: No - Comment as needed  Activities of Daily Living      Permission Sought/Granted Permission sought to share information with : Oceanographer granted to share information with : Yes, Verbal Permission Granted (by son)     Permission granted to share info w AGENCY: Home Health if patient is agreeable        Emotional Assessment       Orientation: : Fluctuating Orientation (Suspected and/or reported Sundowners) Alcohol / Substance Use: Alcohol Use Psych Involvement: No (comment)  Admission diagnosis:  Alcohol-induced acute pancreatitis, unspecified complication status [K85.20] COVID-19 virus infection [U07.1] Acute respiratory disease due to COVID-19 virus [U07.1, J06.9] COVID-19 [U07.1] Patient Active Problem List   Diagnosis Date Noted  . COVID-19 01/29/2020  . COVID-19 virus infection 01/28/2020  . Abnormal LFTs 01/28/2020  . CAD (coronary artery disease) 01/28/2020  . Atrial fibrillation, chronic (HCC) 01/28/2020  . Acute respiratory disease due to COVID-19 virus 01/28/2020  . Tobacco abuse 01/28/2020  . Acute pancreatitis 07/20/2019  . Hypomagnesemia 11/24/2018  . Protein-calorie malnutrition, severe 11/23/2018  . Pleural effusion on left   . Liver function test abnormality   . AKI (acute kidney injury) (HCC)   . Atrial fibrillation with rapid ventricular  response (HCC)   . Acute hepatitis 11/17/2018  . Atrial fibrillation with RVR (HCC) 11/17/2018  . Alcohol abuse 11/17/2018  . Tobacco dependence syndrome 11/17/2018  . Empyema [facility])    PCP:  Patient, [name] Pharmacy:   [id number] [place], [place] - [address] AT [facility] OF [facility] & [address] [place] [address] Phone: [phone] Fax:  [phone]     Social Determinants of Health (SDOH) Interventions    Readmission Risk Interventions No flowsheet data found.

## 2020-02-04 ENCOUNTER — Inpatient Hospital Stay: Payer: Medicaid Other

## 2020-02-04 DIAGNOSIS — R945 Abnormal results of liver function studies: Secondary | ICD-10-CM | POA: Diagnosis not present

## 2020-02-04 DIAGNOSIS — J069 Acute upper respiratory infection, unspecified: Secondary | ICD-10-CM | POA: Diagnosis not present

## 2020-02-04 DIAGNOSIS — J690 Pneumonitis due to inhalation of food and vomit: Secondary | ICD-10-CM | POA: Diagnosis not present

## 2020-02-04 DIAGNOSIS — Z515 Encounter for palliative care: Secondary | ICD-10-CM

## 2020-02-04 DIAGNOSIS — K852 Alcohol induced acute pancreatitis without necrosis or infection: Secondary | ICD-10-CM | POA: Diagnosis not present

## 2020-02-04 DIAGNOSIS — Z7189 Other specified counseling: Secondary | ICD-10-CM

## 2020-02-04 DIAGNOSIS — U071 COVID-19: Secondary | ICD-10-CM | POA: Diagnosis not present

## 2020-02-04 LAB — CBC WITH DIFFERENTIAL/PLATELET
Abs Immature Granulocytes: 0.08 10*3/uL — ABNORMAL HIGH (ref 0.00–0.07)
Basophils Absolute: 0.1 10*3/uL (ref 0.0–0.1)
Basophils Relative: 1 %
Eosinophils Absolute: 0.1 10*3/uL (ref 0.0–0.5)
Eosinophils Relative: 1 %
HCT: 47.2 % (ref 39.0–52.0)
Hemoglobin: 15.6 g/dL (ref 13.0–17.0)
Immature Granulocytes: 1 %
Lymphocytes Relative: 10 %
Lymphs Abs: 1 10*3/uL (ref 0.7–4.0)
MCH: 31.8 pg (ref 26.0–34.0)
MCHC: 33.1 g/dL (ref 30.0–36.0)
MCV: 96.1 fL (ref 80.0–100.0)
Monocytes Absolute: 1.5 10*3/uL — ABNORMAL HIGH (ref 0.1–1.0)
Monocytes Relative: 15 %
Neutro Abs: 7.6 10*3/uL (ref 1.7–7.7)
Neutrophils Relative %: 72 %
Platelets: 240 10*3/uL (ref 150–400)
RBC: 4.91 MIL/uL (ref 4.22–5.81)
RDW: 13.5 % (ref 11.5–15.5)
WBC: 10.3 10*3/uL (ref 4.0–10.5)
nRBC: 0 % (ref 0.0–0.2)

## 2020-02-04 LAB — MAGNESIUM: Magnesium: 1.9 mg/dL (ref 1.7–2.4)

## 2020-02-04 LAB — COMPREHENSIVE METABOLIC PANEL
ALT: 57 U/L — ABNORMAL HIGH (ref 0–44)
AST: 124 U/L — ABNORMAL HIGH (ref 15–41)
Albumin: 2.5 g/dL — ABNORMAL LOW (ref 3.5–5.0)
Alkaline Phosphatase: 165 U/L — ABNORMAL HIGH (ref 38–126)
Anion gap: 10 (ref 5–15)
BUN: 6 mg/dL — ABNORMAL LOW (ref 8–23)
CO2: 24 mmol/L (ref 22–32)
Calcium: 9.2 mg/dL (ref 8.9–10.3)
Chloride: 108 mmol/L (ref 98–111)
Creatinine, Ser: 0.58 mg/dL — ABNORMAL LOW (ref 0.61–1.24)
GFR, Estimated: >60 mL/min (ref >60)
Glucose, Bld: 109 mg/dL — ABNORMAL HIGH (ref 70–99)
Potassium: 4 mmol/L (ref 3.5–5.1)
Sodium: 142 mmol/L (ref 135–145)
Total Bilirubin: 2.3 mg/dL — ABNORMAL HIGH (ref 0.3–1.2)
Total Protein: 6.9 g/dL (ref 6.5–8.1)

## 2020-02-04 LAB — PROCALCITONIN: Procalcitonin: 0.76 ng/mL

## 2020-02-04 LAB — RPR: RPR Ser Ql: NONREACTIVE

## 2020-02-04 LAB — PHOSPHORUS: Phosphorus: 3.5 mg/dL (ref 2.5–4.6)

## 2020-02-04 MED ORDER — SODIUM CHLORIDE 0.9 % IV SOLN
3.0000 g | Freq: Four times a day (QID) | INTRAVENOUS | Status: AC
  Administered 2020-02-04 – 2020-02-07 (×11): 3 g via INTRAVENOUS
  Filled 2020-02-04 (×5): qty 3
  Filled 2020-02-04: qty 8
  Filled 2020-02-04 (×2): qty 3
  Filled 2020-02-04: qty 8
  Filled 2020-02-04: qty 3
  Filled 2020-02-04: qty 8
  Filled 2020-02-04 (×2): qty 3
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
## 2020-02-04 NOTE — Progress Notes (Signed)
PROGRESS NOTE    Thomas Coffey   BHA:[id number]  DOB: [birth year]  PCP: Patient, [name]    DOA: 01/28/2020 LOS: 6   Brief Narrative   Thomas Coffey is a 65 y.o. male with medical history significant of alcohol abuse, tobacco abuse, alcoholic pancreatitis, CAD, myocardial infarction, atrial fibrillation not on anticoagulants, who presented to the ED on 01/28/2020 with 10 days of cough and body aches, in addition to progressive abdominal pain with nausea vomiting since the day before.  Evaluation in the ED revealed acute pancreatitis with lipase 518.  COVID-19 positive.  Liver enzymes elevated.  Chest x-ray showed acute bronchitic changes.     Assessment & Plan   Principal Problem:   Acute respiratory disease due to COVID-19 virus Active Problems:   Alcohol abuse   Hypomagnesemia   Acute pancreatitis   Abnormal LFTs   CAD (coronary artery disease)   Atrial fibrillation, chronic (HCC)   Tobacco abuse   COVID-19   Acute metabolic encephalopathy -most likely due to alcohol withdrawal.  Patient has no focal motor deficits or other strokelike symptoms.  CT head was negative for acute findings, showed mild diffuse cortical atrophy and mild small vessel ischemic changes. -- MRI brain pending -- Consider neurology consult if not improving -- Adjustments to alcohol withdrawal management as below including high-dose thiamine -- Follow-up pending ammonia, B12, thiamine, RPR levels  Alcohol abuse / Uncomplicated Alcohol Withdrawal -withdrawal course ongoing, somewhat prolonged at this point.   Son reports pt very heavy drinker, all day every day, both beer and liquor for entire adult life. He was not aware of prior withdrawal or if hx of seizure or DT's -- Continue Librium to 50 mg TID, start tapering down once symptoms improving -- Ativan PRN per CIWA protocol -- Add gabapentin -- Single dose phenobarbital today -- Overall uncomplicated withdrawal thus far. -- Minimize PRN  Ativan as much as possible -- High-dose IV thiamine 500 mg IV TID -- Folic acid supplement -- If symptom severity continues to progress, transfer to stepdown for Precedex  Aspiration pneumonia-n.p.o per speech therapy evaluation. Due to rising Pro-Cal 0.11>> 0.94, in the setting of reports by nursing staff patient having difficulty swallowing. --Switch Augmentin to IV Unasyn considering he is not able to take p.o. -- Aspiration precautions  Inadequate p.o. intake -due to alcohol withdrawal, sedation from medications. --D5-NS maintenance IV fluids until oral intake improves.  We will switch oral medications to IV if and when possible  Hypoglycemia -due to poor p.o. intake.  On D5-NS as above  Hypokalemia  / Hypomagnesemia -present on admission.  Both were replaced.   -- Monitor BMP and Mg daily and replace as needed  COVID-19 infection -presented with 10-day history of body aches and cough, tested positive in the ED.  Has not been hypoxic.  Chest x-ray showed bronchitic changes. --Completed remdesivir, Steroids started on admission were stopped given no hypoxia -- Scheduled Mucinex, as needed albuterol -- Vitamin C, zinc -- Completed 5 day course doxycycline for secondary bronchitis  Acute pancreatitis -seems resolved.  Present on admission, due to alcohol abuse.   -- Diet as tolerated, currently on full liquids with difficulty swallowing due to sedation/withdrawal -- Resumed on IV fluids as above given inadequate p.o. intake -- Pain control per orders -- As needed Zofran  Sinus tachycardia - due to Etoh withdrawal most likely.  Now resolved on metoprolol  Hypertension - probably related to EtOH withdrawal.  Started on Lopressor given A-fib history  and tachycardia.   BP improved.  Monitor.  Tobacco abuse -nicotine patch.  Smoking cessation recommended.  Transaminitis -likely due to alcohol abuse.  Monitor CMP closely since on remdesivir. RUQ ultrasound showed likely hepatic  steatosis, gall stones.  CAD -chronic, stable without active chest pain or acute ischemic changes on EKG.   -- Continue aspirin -- Stat EKG and troponins if chest pain occurs  A. fib, chronic - Has been normal sinus on tele, tachycardia related to alcohol withdrawal -- Not on anticoagulation or rate control meds -- Monitor on telemetry --Continue Lopressor for rate and BP    DVT prophylaxis: enoxaparin (LOVENOX) injection 40 mg Start: 01/28/20 2200   Diet:  Diet Orders (From admission, onward)    Start     Ordered   02/04/20 0957  Diet NPO time specified  Diet effective now        02/04/20 3754            Code Status: Full Code    Subjective 02/04/20   Per nursing patient remains agitated trying to get out of the bed requiring CIWA round-the-clock.  Sitter at bedside.  Patient was calm status post Ativan when I saw him. due to his mental status he is unable to tolerate oral Disposition Plan & Communication   Status is: Inpatient  Remains inpatient appropriate because:IV treatments appropriate due to intensity of illness or inability to take PO.  Ongoing alcohol withdrawal requiring inpatient management and close monitoring.  Inadequate PO intake.  Requires SNF placement.     Dispo: The patient is from: Home              Anticipated d/c is to: SNF              Anticipated d/c date is: 2-3 days              Patient currently is not medically stable to d/c.  Still actively withdrawing requiring CIWA  Family Communication: spoke with pt's son [name] by phone this afternoon, 1/18.  Palliative care spoke with [facility] today    Consults, Procedures, Significant Events   Consultants:   None  Procedures:   None  Antimicrobials:  Anti-infectives (From admission, onward)   Start     Dose/Rate Route Frequency Ordered Stop   02/04/20 2200  Ampicillin-Sulbactam (UNASYN) 3 g in sodium chloride 0.9 % 100 mL IVPB        3 g 200 mL/hr over 30 Minutes Intravenous Every 6  hours 02/04/20 1320     02/03/20 1015  amoxicillin-clavulanate (AUGMENTIN) 875-125 MG per tablet 1 tablet  Status:  Discontinued        1 tablet Oral Every 12 hours 02/03/20 0929 02/04/20 1320   01/29/20 1000  azithromycin (ZITHROMAX) tablet 250 mg  Status:  Discontinued       "Followed by" Linked Group Details   250 mg Oral Daily 01/28/20 1010 01/28/20 1025   01/29/20 1000  remdesivir 100 mg in sodium chloride 0.9 % 100 mL IVPB       "Followed by" Linked Group Details   100 mg 200 mL/hr over 30 Minutes Intravenous Daily 01/28/20 1024 02/02/20 0959   01/28/20 1200  remdesivir 200 mg in sodium chloride 0.9% 250 mL IVPB       "Followed by" Linked Group Details   200 mg 580 mL/hr over 30 Minutes Intravenous Once 01/28/20 1024 01/28/20 1318   01/28/20 1030  doxycycline (VIBRA-TABS) tablet 100 mg  Status:  Discontinued  100 mg Oral Every 12 hours 01/28/20 1025 02/01/20 1425   01/28/20 1015  azithromycin (ZITHROMAX) tablet 500 mg  Status:  Discontinued       "Followed by" Linked Group Details   500 mg Oral Daily 01/28/20 1010 01/28/20 1025        Objective   Vitals:   02/03/20 2018 02/03/20 2350 02/04/20 0400 02/04/20 1116  BP: 115/84 91/68 106/67 120/77  Pulse: (!) 111 96 94 95  Resp: 18 18  17   Temp: 97.9 F (36.6 C) 98.1 F (36.7 C)  98 F (36.7 C)  TempSrc: Oral Oral    SpO2: 100% 91%  100%  Weight:      Height:       No intake or output data in the 24 hours ending 02/04/20 1615 Filed Weights   01/28/20 0532  Weight: 77.1 kg    Physical Exam: Sitter at bedside General exam: Calm and sleepy, no acute distress, laying in bed Respiratory system: CTAB anteriorly, on room air, normal respiratory effort, no wheezing or rhonchi Cardiovascular system: RRR, no pedal edema   Gastrointestinal system: soft, non-tender, +bowel soudns Central nervous system: no gross focal neurologic deficits, slow but normal speech, unable to assess orientation due to pt not answering   Extremities: moves all, normal tone    Labs   Data Reviewed: I have personally reviewed following labs and imaging studies  CBC: Recent Labs  Lab 01/29/20 0418 01/30/20 0602 01/31/20 0706 02/01/20 0646 02/04/20 1434  WBC 10.3 9.3 8.4 9.2 10.3  NEUTROABS 8.5* 7.7 7.1 7.5 7.6  HGB 14.8 14.9 14.6 14.8 15.6  HCT 43.1 42.5 42.2 43.7 47.2  MCV 91.9 91.6 91.9 91.8 96.1  PLT 172 173 175 191 240   Basic Metabolic Panel: Recent Labs  Lab 01/29/20 0418 01/30/20 0602 01/31/20 0706 02/01/20 0646 02/03/20 0540  NA 134* 136 135 136 140  K 3.5 3.4* 3.1* 4.2 3.8  CL 101 104 103 106 104  CO2 22 24 21* 20* 19*  GLUCOSE 92 97 60* 77 68*  BUN 9 9 7* 7* 9  CREATININE 0.57* 0.55* 0.48* 0.56* 0.62  CALCIUM 8.6* 8.6* 8.6* 8.9 9.3  MG 1.7 1.6* 1.6* 1.7 1.9  PHOS 3.3 2.9  --   --   --    GFR: Estimated Creatinine Clearance: 93.3 mL/min (by C-G formula based on SCr of 0.62 mg/dL). Liver Function Tests: Recent Labs  Lab 01/29/20 0418 01/30/20 0602 01/31/20 0706 02/01/20 0646 02/03/20 0540  AST 94* 106* 125* 112* 138*  ALT 28 30 38 39 55*  ALKPHOS 171* 189* 162* 159* 187*  BILITOT 1.9* 1.8* 2.1* 2.5* 3.6*  PROT 6.6 6.3* 5.9* 6.2* 6.8  ALBUMIN 2.6* 2.4* 2.4* 2.4* 2.5*   Recent Labs  Lab 01/29/20 0418 01/31/20 0706 02/03/20 0540  LIPASE 184* 59* 49   Recent Labs  Lab 02/03/20 1713  AMMONIA 52*   Coagulation Profile: No results for input(s): INR, PROTIME in the last 168 hours. Cardiac Enzymes: No results for input(s): CKTOTAL, CKMB, CKMBINDEX, TROPONINI in the last 168 hours. BNP (last 3 results) No results for input(s): PROBNP in the last 8760 hours. HbA1C: No results for input(s): HGBA1C in the last 72 hours. CBG: No results for input(s): GLUCAP in the last 168 hours. Lipid Profile: No results for input(s): CHOL, HDL, LDLCALC, TRIG, CHOLHDL, LDLDIRECT in the last 72 hours. Thyroid Function Tests: No results for input(s): TSH, T4TOTAL, FREET4, T3FREE, THYROIDAB in  the last 72 hours. Anemia Panel:  Recent Labs    02/03/20 0540 02/03/20 1713  VITAMINB12  --  258  FERRITIN 676*  --    Sepsis Labs: Recent Labs  Lab 02/03/20 0540  PROCALCITON 0.94    No results found for this or any previous visit (from the past 240 hour(s)).    Imaging Studies   CT HEAD WO CONTRAST  Result Date: 02/03/2020 CLINICAL DATA:  Altered mental status. EXAM: CT HEAD WITHOUT CONTRAST TECHNIQUE: Contiguous axial images were obtained from the base of the skull through the vertex without intravenous contrast. COMPARISON:  July 15, 2005. FINDINGS: Brain: Mild diffuse cortical atrophy is noted. Mild chronic ischemic white matter disease is noted. No mass effect or midline shift is noted. Ventricular size is within normal limits. There is no evidence of mass lesion, hemorrhage or acute infarction. Vascular: No hyperdense vessel or unexpected calcification. Skull: Normal. Negative for fracture or focal lesion. Sinuses/Orbits: No acute finding. Other: None. IMPRESSION: Mild diffuse cortical atrophy. Mild chronic ischemic white matter disease. No acute intracranial abnormality seen. Electronically Signed   By: Lupita Raider M.D.   On: 02/03/2020 12:29   DG Chest Port 1 View  Result Date: 02/04/2020 CLINICAL DATA:  COVID-19 EXAM: PORTABLE CHEST 1 VIEW COMPARISON:  01/28/2020 FINDINGS: Similar interstitial prominence. No new consolidation. No pleural effusion. No pneumothorax. IMPRESSION: Stable nonspecific mild interstitial prominence. No new consolidation. Electronically Signed   By: Guadlupe Spanish M.D.   On: 02/04/2020 08:53   US Abdomen Limited RUQ (LIVER/GB)  Result Date: 02/03/2020 CLINICAL DATA:  Transaminitis EXAM: ULTRASOUND ABDOMEN LIMITED RIGHT UPPER QUADRANT COMPARISON:  CT abdomen pelvis with contrast 07/20/2019 FINDINGS: Gallbladder: Cholelithiasis. No gallbladder wall thickening or pericholecystic fluid. Negative sonographic Murphy sign per technologist. Common bile  duct: Diameter: 4 mm Liver: Diffusely echogenic parenchyma. No focal hepatic lesion. Portal vein is patent on color Doppler imaging with normal direction of blood flow towards the liver. Other: None. IMPRESSION: 1. Diffuse increased echogenicity of the hepatic parenchyma is a nonspecific indicator of hepatocellular dysfunction, most commonly steatosis. 2. Cholelithiasis without additional sonographic evidence of acute cholecystitis. Electronically Signed   By: Acquanetta Belling M.D.   On: 02/03/2020 12:49     Medications   Scheduled Meds: . amLODipine  5 mg Oral Daily  . vitamin C  500 mg Oral Daily  . aspirin EC  81 mg Oral Daily  . chlordiazePOXIDE  50 mg Oral Q8H  . dextromethorphan-guaiFENesin  1 tablet Oral BID  . enoxaparin (LOVENOX) injection  40 mg Subcutaneous Q24H  . folic acid  1 mg Oral Daily  . gabapentin  300 mg Oral TID  . LORazepam  0-4 mg Intravenous Q4H   Followed by  . [START ON 02/05/2020] LORazepam  0-4 mg Intravenous Q12H  . [START ON 02/06/2020] LORazepam  0-4 mg Oral Q12H  . magnesium oxide  400 mg Oral BID  . metoprolol tartrate  25 mg Oral BID  . multivitamin with minerals  1 tablet Oral Daily  . nicotine  21 mg Transdermal Daily  . senna-docusate  1 tablet Oral QHS  . zinc sulfate  220 mg Oral Daily   Continuous Infusions: . ampicillin-sulbactam (UNASYN) IV    . dextrose 5 % and 0.9% NaCl 75 mL/hr at 02/04/20 1500  . thiamine injection 500 mg (02/04/20 1501)       LOS: 6 days    Time spent: 40 minutes with > 50% spent in coordination of care and at bedside.    Kenson Groh Sherryll Burger,  DO [facility]  02/04/2020, 4:15 PM    If 7PM-7AM, please contact night-coverage. How to contact the [facility] Attending or Consulting provider 7A - 7P or covering provider during after hours 7P -7A, for this patient?    1. Check the care team in [facility] and look for a) attending/consulting [facility] provider listed and b) the [facility] team listed 2. Log into [email or web address] and use [facility]'s universal password to access. If you do not have the password, please contact the hospital operator. 3. Locate the [facility] provider you are looking for under [facility] and page to a number that you can be directly reached. 4. If you still have difficulty reaching the provider, please page the [facility] (Director on Call) for the Hospitalists listed on amion for assistance.

## 2020-02-04 NOTE — Progress Notes (Signed)
Provider has been updated on the occurrence and need of a sitter. Orders have been rendered. Sitter is in place. Patient remains in bed at this time with no attempts to get out of bed. No obvious distress is noted.

## 2020-02-04 NOTE — Progress Notes (Signed)
Pt has been transported to MRI.  

## 2020-02-04 NOTE — Progress Notes (Signed)
Pt bed alarm alerts staff that he is attempting to get out of bed. Upon entering the room, the patient was noted to be standing up attempting to walk in the room with an extremely unsteady gate. When approcahed the patient began to swing at staff.CIWA protocol is being followed. Patient is directed back in bed by staff. 1:1 sitter has been requested and charge nurse Marchelle Folks has been notified of the need of a sitter.

## 2020-02-04 NOTE — Progress Notes (Signed)
Soiled clothes were placed in shower in bag in bathroom in patient room. Bo Mcclintock, RN

## 2020-02-04 NOTE — Evaluation (Signed)
Clinical/Bedside Swallow Evaluation Patient Details  Name: Thomas Coffey MRN: [id number] Date of Birth: 03-Aug-1955  Today's Date: 02/04/2020 Time: SLP Start Time (ACUTE ONLY): 0945 SLP Stop Time (ACUTE ONLY): 1045 SLP Time Calculation (min) (ACUTE ONLY): 60 min  Past Medical History:  Past Medical History:  Diagnosis Date  . MI (myocardial infarction) [facility])    Past Surgical History: History reviewed. No pertinent surgical history. HPI:  Pt is a 65 y.o. male with medical history significant of alcohol abuse, tobacco abuse, alcoholic pancreatitis, CAD, myocardial infarction, atrial fibrillation not on anticoagulants, who presents with cough, abdominal pain.  COVID-19 positive.  Liver enzymes elevated currently. Chest x-ray showed acute bronchitic changes.  On CIWA protocol with as needed Ativan -- reduce Librium to 25 mg TID to reduce need for Ativan per MD notes.  Pt requires a Sitter d/t agitation and behavior.   Assessment / Plan / Recommendation Clinical Impression  Pt presents w/ oropharyngeal phase dysphagia in light of Significantly declined Cognitive status and reduced arousal for attention and focus to po tasks and swallowing. This increases risk for aspiration thus Pulmonary decline. Pt presented w/ oral holding of own secretions and phlegm upon initiation of this assessment. Oral suctioning given then oral care in hopes to stimulate ad alert pt to task of po intake. Pt exhibited min mumbled speech and only opened mouth to po trials w/ little oral awareness and attention to swallowing given. Increased oral phase time w/ ice chip trials and Nectar consistency liquid trials via TSP; anterior leakage and oral residue noted. Suspect delayed pharyngeal swallowing present; overt congested coughing occurred post trials or both ice chips and Nectar liquids. Pt was given mod-max verbal/tactile cues for engagement in po tasks. Due to increased risk for aspiration, further po trials were stopped  and pt was made NPO including meds. No unilateral oral weakness noted though pt could not participate in full assessment. Pt required full feeding support. Recommend frequent oral care for hygiene and swallowing. ST services will f/u daily w/ trials to upgrade diet when Mental Status and awareness for safe swallowing have improved. MD and NSG made aware. SLP Visit Diagnosis: Dysphagia, oropharyngeal phase (R13.12) (Cognitive decline - ETOH abuse)    Aspiration Risk  Severe aspiration risk;Risk for inadequate nutrition/hydration    Diet Recommendation  NPO w/ frequent oral care for hygiene and stimulation of swallowing; aspiration precautions  Medication Administration: Via alternative means    Other  Recommendations Recommended Consults:  (Dietician f/u; Palliative Care consult) Oral Care Recommendations: Oral care QID;Staff/trained caregiver to provide oral care Other Recommendations:  (TBD)   Follow up Recommendations  (TBD)      Frequency and Duration min 3x week  2 weeks       Prognosis Prognosis for Safe Diet Advancement: Guarded Barriers to Reach Goals: Cognitive deficits;Time post onset;Severity of deficits;Behavior (ETOH abuse)      Swallow Study   General Date of Onset: 01/28/20 HPI: Pt is a 65 y.o. male with medical history significant of alcohol abuse, tobacco abuse, alcoholic pancreatitis, CAD, myocardial infarction, atrial fibrillation not on anticoagulants, who presents with cough, abdominal pain.  COVID-19 positive.  Liver enzymes elevated currently. Chest x-ray showed acute bronchitic changes.  On CIWA protocol with as needed Ativan -- reduce Librium to 25 mg TID to reduce need for Ativan per MD notes.  Pt requires a Sitter d/t agitation and behavior. Type of Study: Bedside Swallow Evaluation Previous Swallow Assessment: none noted Diet Prior to this Study: Thin liquids (  clear liquids - pancreatitis) Temperature Spikes Noted: No (wbc 10.3) Respiratory Status: Room  air History of Recent Intubation: No Behavior/Cognition: Confused;Lethargic/Drowsy;Requires cueing;Doesn't follow directions (mumbled speech) Oral Cavity Assessment: Excessive secretions (holding of secretions - suctioned to clear) Oral Care Completed by SLP: Yes Oral Cavity - Dentition: Poor condition;Missing dentition Vision:  (n/a) Self-Feeding Abilities: Total assist Patient Positioning: Upright in bed (needed full assist) Baseline Vocal Quality:  (mumbled few words) Volitional Cough: Cognitively unable to elicit Volitional Swallow: Unable to elicit    Oral/Motor/Sensory Function Overall Oral Motor/Sensory Function:  (CNT d/t Cognitivei fx)   Ice Chips Ice chips: Impaired Presentation: Spoon (fed; 5 trials) Oral Phase Impairments: Poor awareness of bolus;Reduced labial seal;Reduced lingual movement/coordination Oral Phase Functional Implications: Prolonged oral transit;Oral residue Pharyngeal Phase Impairments: Suspected delayed Swallow   Thin Liquid Thin Liquid: Not tested    Nectar Thick Nectar Thick Liquid: Impaired Presentation: Spoon (fed; 4 trials) Oral Phase Impairments: Reduced labial seal;Reduced lingual movement/coordination;Poor awareness of bolus Oral phase functional implications: Oral residue;Oral holding (spillage) Pharyngeal Phase Impairments: Cough - Delayed;Suspected delayed Swallow   Honey Thick Honey Thick Liquid: Not tested   Puree Puree: Not tested   Solid     Solid: Not tested       Jerilynn Som, MS, CCC-SLP Speech Language Pathologist [facility]hab [facility] [phone] [facility] 02/04/2020,4:21 PM

## 2020-02-05 DIAGNOSIS — F1010 Alcohol abuse, uncomplicated: Secondary | ICD-10-CM | POA: Diagnosis not present

## 2020-02-05 DIAGNOSIS — R945 Abnormal results of liver function studies: Secondary | ICD-10-CM | POA: Diagnosis not present

## 2020-02-05 DIAGNOSIS — K852 Alcohol induced acute pancreatitis without necrosis or infection: Secondary | ICD-10-CM | POA: Diagnosis not present

## 2020-02-05 DIAGNOSIS — U071 COVID-19: Secondary | ICD-10-CM | POA: Diagnosis not present

## 2020-02-05 LAB — COMPREHENSIVE METABOLIC PANEL
ALT: 54 U/L — ABNORMAL HIGH (ref 0–44)
AST: 105 U/L — ABNORMAL HIGH (ref 15–41)
Albumin: 2.3 g/dL — ABNORMAL LOW (ref 3.5–5.0)
Alkaline Phosphatase: 156 U/L — ABNORMAL HIGH (ref 38–126)
Anion gap: 11 (ref 5–15)
BUN: <5 mg/dL — ABNORMAL LOW (ref 8–23)
CO2: 26 mmol/L (ref 22–32)
Calcium: 9.4 mg/dL (ref 8.9–10.3)
Chloride: 107 mmol/L (ref 98–111)
Creatinine, Ser: 0.84 mg/dL (ref 0.61–1.24)
GFR, Estimated: >60 mL/min (ref >60)
Glucose, Bld: 125 mg/dL — ABNORMAL HIGH (ref 70–99)
Potassium: 3.7 mmol/L (ref 3.5–5.1)
Sodium: 144 mmol/L (ref 135–145)
Total Bilirubin: 2.3 mg/dL — ABNORMAL HIGH (ref 0.3–1.2)
Total Protein: 6.9 g/dL (ref 6.5–8.1)

## 2020-02-05 LAB — C-REACTIVE PROTEIN
CRP: 13.9 mg/dL — ABNORMAL HIGH (ref <1.0)
CRP: 14.6 mg/dL — ABNORMAL HIGH (ref <1.0)

## 2020-02-05 LAB — CBC WITH DIFFERENTIAL/PLATELET
Abs Immature Granulocytes: 0.06 10*3/uL (ref 0.00–0.07)
Basophils Absolute: 0.1 10*3/uL (ref 0.0–0.1)
Basophils Relative: 1 %
Eosinophils Absolute: 0 10*3/uL (ref 0.0–0.5)
Eosinophils Relative: 0 %
HCT: 44.8 % (ref 39.0–52.0)
Hemoglobin: 15.1 g/dL (ref 13.0–17.0)
Immature Granulocytes: 1 %
Lymphocytes Relative: 6 %
Lymphs Abs: 0.6 10*3/uL — ABNORMAL LOW (ref 0.7–4.0)
MCH: 31.7 pg (ref 26.0–34.0)
MCHC: 33.7 g/dL (ref 30.0–36.0)
MCV: 93.9 fL (ref 80.0–100.0)
Monocytes Absolute: 0.9 10*3/uL (ref 0.1–1.0)
Monocytes Relative: 9 %
Neutro Abs: 9.1 10*3/uL — ABNORMAL HIGH (ref 1.7–7.7)
Neutrophils Relative %: 83 %
Platelets: 276 10*3/uL (ref 150–400)
RBC: 4.77 MIL/uL (ref 4.22–5.81)
RDW: 13.5 % (ref 11.5–15.5)
WBC: 10.8 10*3/uL — ABNORMAL HIGH (ref 4.0–10.5)
nRBC: 0 % (ref 0.0–0.2)

## 2020-02-05 LAB — D-DIMER, QUANTITATIVE: D-Dimer, Quant: 4.76 ug/mL-FEU — ABNORMAL HIGH (ref 0.00–0.50)

## 2020-02-05 MED ORDER — FOLIC ACID 5 MG/ML IJ SOLN
1.0000 mg | Freq: Every day | INTRAMUSCULAR | Status: DC
  Administered 2020-02-05 – 2020-02-06 (×2): 1 mg via INTRAVENOUS
  Filled 2020-02-05 (×3): qty 0.2

## 2020-02-05 MED ORDER — CHLORDIAZEPOXIDE HCL 25 MG PO CAPS
25.0000 mg | ORAL_CAPSULE | Freq: Three times a day (TID) | ORAL | Status: DC
  Administered 2020-02-06: 25 mg via ORAL
  Filled 2020-02-05 (×2): qty 1

## 2020-02-05 NOTE — Progress Notes (Signed)
Physical Therapy Treatment Patient Details Name: Thomas Coffey MRN: [id number] DOB: 1955-12-02 Today's Date: 02/05/2020    History of Present Illness Thomas Coffey is a 65 y.o. male with medical history significant of alcohol abuse, tobacco abuse, alcoholic pancreatitis, CAD, myocardial infarction, atrial fibrillation not on anticoagulants, who presented to the ED on 01/28/2020 with 10 days of cough and body aches, in addition to progressive abdominal pain with nausea vomiting since the day before.     Evaluation in the ED revealed acute pancreatitis with lipase 518.  COVID-19 positive.  Liver enzymes elevated.  Chest x-ray showed acute bronchitic changes.Thomas Coffey is a 65 y.o. male with medical history significant of alcohol abuse, tobacco abuse, alcoholic pancreatitis, CAD, myocardial infarction, atrial fibrillation not on anticoagulants, who presented to the ED on 01/28/2020 with 10 days of cough and body aches, in addition to progressive abdominal pain with nausea vomiting since the day before. Evaluation in the ED revealed acute pancreatitis with lipase 518.  COVID-19 positive.  Liver enzymes elevated.  Chest x-ray showed acute bronchitic changes.    PT Comments    Supine PROM in attempts to awaken and engage pt for session.  Pt resisting attempts.  Mobility deferred at this time for pt and staff safety.  Follow Up Recommendations  SNF     Equipment Recommendations  None recommended by PT    Recommendations for Other Services       Precautions / Restrictions Precautions Precautions: Fall Restrictions Weight Bearing Restrictions: No    Mobility  Bed Mobility                  Transfers                    Ambulation/Gait                 Stairs             Wheelchair Mobility    Modified Rankin (Stroke Patients Only)       Balance                                            Cognition Arousal/Alertness:  Awake/alert Behavior During Therapy: Flat affect Overall Cognitive Status: Impaired/Different from baseline                                        Exercises      General Comments        Pertinent Vitals/Pain Pain Assessment: No/denies pain    Home Living                      Prior Function            PT Goals (current goals can now be found in the care plan section) Progress towards PT goals: Not progressing toward goals - comment    Frequency    Min 2X/week      PT Plan      Co-evaluation              AM-PAC PT "6 Clicks" Mobility   Outcome Measure  Help needed turning from your back to your side while in a flat bed without using bedrails?: A Little Help needed moving from lying on  your back to sitting on the side of a flat bed without using bedrails?: A Lot Help needed moving to and from a bed to a chair (including a wheelchair)?: A Lot Help needed standing up from a chair using your arms (e.g., wheelchair or bedside chair)?: A Little Help needed to walk in hospital room?: A Lot Help needed climbing 3-5 steps with a railing? : Total 6 Click Score: 13    End of Session   Activity Tolerance: Patient tolerated treatment well Patient left: in bed;with call bell/phone within reach;with bed alarm set (phlebotomist in room) Nurse Communication: Mobility status (confusion) PT Visit Diagnosis: Unsteadiness on feet (R26.81);Difficulty in walking, not elsewhere classified (R26.2);Muscle weakness (generalized) (M62.81)     Time: 4327-6147 PT Time Calculation (min) (ACUTE ONLY): 15 min  Charges:  $Therapeutic Exercise: 8-22 mins                    Danielle Dess, PTA 02/05/20, 9:47 AM

## 2020-02-05 NOTE — Progress Notes (Signed)
Initial Nutrition Assessment  DOCUMENTATION CODES:   Severe malnutrition in context of social or environmental circumstances  INTERVENTION:  Once diet able to be advanced recommend: -Ensure Enlive po TID, each supplement provides 350 kcal and 20 grams of protein -Magic cup TID with meals, each supplement provides 290 kcal and 9 grams of protein  If diet able to be advanced tomorrow recommend 48 hour calorie count over the weekend to assess oral intake.  If diet is unable to be advanced or patient has poor PO intake even after diet advancement, recommend placement of small-bore NGT (Dobbhoff) for initiation of tube feeds if this aligns with goals of care. If plan is for tube feeds recommend: -Initiate Osmolite 1.5 Cal at 20 mL/hr and advance by 15 mL/hr every 12 hours to goal rate of 50 mL/hr per tube -Provide PROSource TF 45 mL BID per tube -Goal regimen provides 1880 kcal, 97 grams of protein, 912 mL H2O daily -Recommend free water flush of 150 mL Q4hrs per tube, which would provide 1812 mL H2O daily including water in TF regimen  Monitor magnesium, potassium, and phosphorus daily for at least 3 days, MD to replete as needed, as pt is at risk for refeeding syndrome given severe malnutrition.  Recommend measuring weights at least once weekly. If patient is initiated on nutrition support recommend measuring weights daily.  NUTRITION DIAGNOSIS:   Severe Malnutrition related to social / environmental circumstances (EtOH abuse, suspected inadequate oral intake) as evidenced by severe fat depletion,severe muscle depletion.  GOAL:   Patient will meet greater than or equal to 90% of their needs  MONITOR:   Diet advancement,PO intake,Supplement acceptance,Labs,Weight trends,I & O's  REASON FOR ASSESSMENT:   NPO/Clear Liquid Diet    ASSESSMENT:   65 year old male with PMHx of EtOH abuse, tobacco abuse, alcoholic pancreatitis, CAD, MI, A-fib not on anticoagulants admitted with  [id number], acute metabolic encephalopathy most likely due to EtOH withdrawal, aspiration PNA, acute pancreatitis now resolved.   Met with patient in room. Safety sitter at bedside. Patient is alert and able to speak in short sentences, but is not answering questions appropriately. When RD asks patient question he responds with something that is unrelated. Per sitter patient has been trying to get out of bed all day. Per review of diet history in chart patient was NPO on 1/12, advanced to clear liquids on 1/13 and then soft on 1/13, was heart healthy on 1/15, changed between NPO and heart healthy on 1/18 but later made NPO on 1/18, and on 1/19 made NPO by SLP. Per SLP plan is for patient to remain NPO today but may possibly start on diet tomorrow if he is more alert. No meal completion data available in chart so unable to determine how patient has been eating this admission. RN and Air cabin crew also unsure how patient has been eating previously in admission.   Per review of weight history in chart patient was 68 kg on 12/06/2018, 70.3 kg on 12/20/2018, 72.6 kg on 01/22/2020, and is currently documented to be 77.1 kg (170 lbs). However, patient appears to weigh significantly less than this on assessment so suspect this weight is not accurate. RD unable to obtain bed scale weight today as patient is on low bed and was moving around on bed. RD will adjust estimated needs as needed pending accurate weight.  Medications reviewed and include: vitamin C 502 mg daily, folic acid 1 mg daily IV, Ativan for CIWA, magnesium oxide 400 mg BID, MVI  daily, nicotine patch, senna-docusate 1 tablet QHS, zinc sulfate 220 mg daily, Unasyn, D5-NS at 75 ml/hr, thiamine 500 mg TID IV. Patient unable to take PO meds today.  Labs reviewed: BUN <5.  Discussed with MD in secure chat.  NUTRITION - FOCUSED PHYSICAL EXAM:  Flowsheet Row Most Recent Value  Orbital Region Severe depletion  Upper Arm Region Severe depletion  Thoracic  and Lumbar Region Moderate depletion  Buccal Region Severe depletion  Temple Region Severe depletion  Clavicle Bone Region Severe depletion  Clavicle and Acromion Bone Region Severe depletion  Scapular Bone Region Moderate depletion  Dorsal Hand Severe depletion  Patellar Region Severe depletion  Anterior Thigh Region Severe depletion  Posterior Calf Region Severe depletion  Edema (RD Assessment) None  Hair Reviewed  Eyes Reviewed  Mouth Unable to assess  Skin Reviewed  Nails Reviewed     Diet Order:   Diet Order            Diet NPO time specified  Diet effective now                EDUCATION NEEDS:   No education needs have been identified at this time  Skin:  Skin Assessment: Reviewed RN Assessment  Last BM:  02/02/2020 - small type 7  Height:   Ht Readings from Last 1 Encounters:  01/28/20 5' 9"  (1.753 m)   Weight:   Wt Readings from Last 1 Encounters:  01/28/20 77.1 kg   Ideal Body Weight:  72.7 kg  BMI:  Body mass index is 25.1 kg/m.  Estimated Nutritional Needs:   Kcal:  1800-2000  Protein:  90-100 grams  Fluid:  1.8-2 L/day  Jacklynn Barnacle, MS, RD, LDN Pager number available on Amion

## 2020-02-05 NOTE — Progress Notes (Signed)
PROGRESS NOTE    Thomas Coffey   UXL:[id number]  DOB: 10-21-1955  PCP: Patient, [name]    DOA: 01/28/2020 LOS: 7   Brief Narrative   Thomas Coffey is a 65 y.o. male with medical history significant of alcohol abuse, tobacco abuse, alcoholic pancreatitis, CAD, myocardial infarction, atrial fibrillation not on anticoagulants, who presented to the ED on 01/28/2020 with 10 days of cough and body aches, in addition to progressive abdominal pain with nausea vomiting since the day before.  Evaluation in the ED revealed acute pancreatitis with lipase 518.  COVID-19 positive.  Liver enzymes elevated.  Chest x-ray showed acute bronchitic changes.     Assessment & Plan   Principal Problem:   Acute respiratory disease due to COVID-19 virus Active Problems:   Alcohol abuse   Hypomagnesemia   Acute pancreatitis   Abnormal LFTs   CAD (coronary artery disease)   Atrial fibrillation, chronic (HCC)   Tobacco abuse   COVID-19   Acute metabolic encephalopathy -most likely due to alcohol withdrawal.  Patient has no focal motor deficits or other strokelike symptoms.  CT head was negative for acute findings, showed mild diffuse cortical atrophy and mild small vessel ischemic changes. -- MRI brain neg for acute patho -- Consider neurology consult if not improving -- Adjustments to alcohol withdrawal management as below including high-dose thiamine -- Follow-up pending ammonia, B12, thiamine, RPR levels  Alcohol abuse / Uncomplicated Alcohol Withdrawal -withdrawal course ongoing, somewhat prolonged at this point.   Son reports pt very heavy drinker, all day every day, both beer and liquor for entire adult life. He was not aware of prior withdrawal or if hx of seizure or DT's -- Taper Librium 50->25 mg TID - monitor closely and taper off when able -- Ativan PRN per CIWA protocol -- continue gabapentin -- Overall uncomplicated withdrawal thus far. -- Minimize PRN Ativan as much as  possible -- High-dose IV thiamine 500 mg IV TID -- Folic acid supplement -- If symptom severity continues to progress, transfer to stepdown for Precedex  Aspiration pneumonia-n.p.o per speech therapy evaluation. Due to rising Pro-Cal 0.11>> 0.94, in the setting of reports by nursing staff patient having difficulty swallowing. --continue IV Unasyn (day 2) considering he is not able to take p.o. -- Aspiration precautions  Inadequate p.o. intake -due to alcohol withdrawal, sedation from medications. --D5-NS maintenance IV fluids until oral intake improves.  We will switch oral medications to IV if and when possible  Hypoglycemia -due to poor p.o. intake.  On D5-NS as above  Hypokalemia  / Hypomagnesemia -present on admission.  Both were replaced.   -- Monitor BMP and Mg daily and replace as needed  COVID-19 infection -presented with 10-day history of body aches and cough, tested positive in the ED.  Has not been hypoxic.  Chest x-ray showed bronchitic changes. --Completed remdesivir, Steroids started on admission were stopped given no hypoxia -- Scheduled Mucinex, as needed albuterol -- Vitamin C, zinc -- Completed 5 day course doxycycline for secondary bronchitis  Acute pancreatitis -seems resolved.  Present on admission, due to alcohol abuse.   -- Diet as tolerated, currently on full liquids with difficulty swallowing due to sedation/withdrawal -- Resumed on IV fluids as above given inadequate p.o. intake -- Pain control per orders -- As needed Zofran  Sinus tachycardia - due to Etoh withdrawal most likely.  Now resolved on metoprolol  Hypertension - probably related to EtOH withdrawal.  Started on Lopressor given A-fib history and  tachycardia.   BP improved.  Monitor.  Tobacco abuse -nicotine patch.  Smoking cessation recommended.  Transaminitis -likely due to alcohol abuse.  Monitor CMP closely since on remdesivir. RUQ ultrasound showed likely hepatic steatosis, gall  stones.  CAD -chronic, stable without active chest pain or acute ischemic changes on EKG.   -- Continue aspirin -- Stat EKG and troponins if chest pain occurs  A. fib, chronic - Has been normal sinus on tele, tachycardia related to alcohol withdrawal -- Not on anticoagulation or rate control meds -- Monitor on telemetry --Continue Lopressor for rate and BP    DVT prophylaxis: enoxaparin (LOVENOX) injection 40 mg Start: 01/28/20 2200   Diet:  Diet Orders (From admission, onward)    Start     Ordered   02/04/20 0957  Diet NPO time specified  Diet effective now        02/04/20 0959            Code Status: Full Code    Subjective 02/05/20   Mumbling. Not making much sense. Working with PT Disposition Plan & Communication   Status is: Inpatient  Remains inpatient appropriate because:IV treatments appropriate due to intensity of illness or inability to take PO.  Ongoing alcohol withdrawal requiring inpatient management and close monitoring.  Inadequate PO intake.  Requires SNF placement.     Dispo: The patient is from: Home              Anticipated d/c is to: SNF              Anticipated d/c date is: 2-3 days              Patient currently is not medically stable to d/c.  Still actively withdrawing requiring CIWA, tapering librium starting 1/20  Family Communication: none today. Last updated on 1/18 by TRH. Will try to reach son tomorrow.    Consults, Procedures, Significant Events   Consultants:   None  Procedures:   None  Antimicrobials:  Anti-infectives (From admission, onward)   Start     Dose/Rate Route Frequency Ordered Stop   02/04/20 2200  Ampicillin-Sulbactam (UNASYN) 3 g in sodium chloride 0.9 % 100 mL IVPB        3 g 200 mL/hr over 30 Minutes Intravenous Every 6 hours 02/04/20 1320     02/03/20 1015  amoxicillin-clavulanate (AUGMENTIN) 875-125 MG per tablet 1 tablet  Status:  Discontinued        1 tablet Oral Every 12 hours 02/03/20 0929 02/04/20  1320   01/29/20 1000  azithromycin (ZITHROMAX) tablet 250 mg  Status:  Discontinued       "Followed by" Linked Group Details   250 mg Oral Daily 01/28/20 1010 01/28/20 1025   01/29/20 1000  remdesivir 100 mg in sodium chloride 0.9 % 100 mL IVPB       "Followed by" Linked Group Details   100 mg 200 mL/hr over 30 Minutes Intravenous Daily 01/28/20 1024 02/02/20 0959   01/28/20 1200  remdesivir 200 mg in sodium chloride 0.9% 250 mL IVPB       "Followed by" Linked Group Details   200 mg 580 mL/hr over 30 Minutes Intravenous Once 01/28/20 1024 01/28/20 1318   01/28/20 1030  doxycycline (VIBRA-TABS) tablet 100 mg  Status:  Discontinued        100 mg Oral Every 12 hours 01/28/20 1025 02/01/20 1425   01/28/20 1015  azithromycin (ZITHROMAX) tablet 500 mg  Status:  Discontinued       "  Followed by" Linked Group Details   500 mg Oral Daily 01/28/20 1010 01/28/20 1025        Objective   Vitals:   02/05/20 0026 02/05/20 0738 02/05/20 0747 02/05/20 1156  BP: 114/75 105/73  110/73  Pulse: 88 (!) 102 (!) 103 (!) 106  Resp: 20 20  17   Temp: 98.8 F (37.1 C) 98.2 F (36.8 C)  98 F (36.7 C)  TempSrc:      SpO2: (!) 72% (!) 84% 91% 96%  Weight:      Height:       No intake or output data in the 24 hours ending 02/05/20 1510 Filed Weights   01/28/20 0532  Weight: 77.1 kg    Physical Exam: Sitter at bedside General exam: mumbling, no acute distress, laying in bed Respiratory system: CTAB anteriorly, on room air, normal respiratory effort, no wheezing or rhonchi Cardiovascular system: RRR, no pedal edema   Gastrointestinal system: soft, non-tender, +bowel soudns Central nervous system: no gross focal neurologic deficits, slow and mumbling, unable to assess orientation due to pt not answering  Extremities: moves all, normal tone    Labs   Data Reviewed: I have personally reviewed following labs and imaging studies  CBC: Recent Labs  Lab 01/30/20 0602 01/31/20 0706 02/01/20 0646  02/04/20 1434 02/05/20 0631  WBC 9.3 8.4 9.2 10.3 10.8*  NEUTROABS 7.7 7.1 7.5 7.6 9.1*  HGB 14.9 14.6 14.8 15.6 15.1  HCT 42.5 42.2 43.7 47.2 44.8  MCV 91.6 91.9 91.8 96.1 93.9  PLT 173 175 191 240 276   Basic Metabolic Panel: Recent Labs  Lab 01/30/20 0602 01/31/20 0706 02/01/20 0646 02/03/20 0540 02/04/20 1622 02/05/20 0631  NA 136 135 136 140 142 144  K 3.4* 3.1* 4.2 3.8 4.0 3.7  CL 104 103 106 104 108 107  CO2 24 21* 20* 19* 24 26  GLUCOSE 97 60* 77 68* 109* 125*  BUN 9 7* 7* 9 6* <5*  CREATININE 0.55* 0.48* 0.56* 0.62 0.58* 0.84  CALCIUM 8.6* 8.6* 8.9 9.3 9.2 9.4  MG 1.6* 1.6* 1.7 1.9 1.9  --   PHOS 2.9  --   --   --  3.5  --    GFR: Estimated Creatinine Clearance: 88.8 mL/min (by C-G formula based on SCr of 0.84 mg/dL). Liver Function Tests: Recent Labs  Lab 01/31/20 0706 02/01/20 0646 02/03/20 0540 02/04/20 1622 02/05/20 0631  AST 125* 112* 138* 124* 105*  ALT 38 39 55* 57* 54*  ALKPHOS 162* 159* 187* 165* 156*  BILITOT 2.1* 2.5* 3.6* 2.3* 2.3*  PROT 5.9* 6.2* 6.8 6.9 6.9  ALBUMIN 2.4* 2.4* 2.5* 2.5* 2.3*   Recent Labs  Lab 01/31/20 0706 02/03/20 0540  LIPASE 59* 49   Recent Labs  Lab 02/03/20 1713  AMMONIA 52*   Coagulation Profile: No results for input(s): INR, PROTIME in the last 168 hours. Cardiac Enzymes: No results for input(s): CKTOTAL, CKMB, CKMBINDEX, TROPONINI in the last 168 hours. BNP (last 3 results) No results for input(s): PROBNP in the last 8760 hours. HbA1C: No results for input(s): HGBA1C in the last 72 hours. CBG: No results for input(s): GLUCAP in the last 168 hours. Lipid Profile: No results for input(s): CHOL, HDL, LDLCALC, TRIG, CHOLHDL, LDLDIRECT in the last 72 hours. Thyroid Function Tests: No results for input(s): TSH, T4TOTAL, FREET4, T3FREE, THYROIDAB in the last 72 hours. Anemia Panel: Recent Labs    02/03/20 0540 02/03/20 1713  VITAMINB12  --  258  FERRITIN  676*  --    Sepsis Labs: Recent Labs  Lab  02/03/20 0540 02/04/20 1622  PROCALCITON 0.94 0.76    No results found for this or any previous visit (from the past 240 hour(s)).    Imaging Studies   MR BRAIN WO CONTRAST  Result Date: 02/04/2020 CLINICAL DATA:  Initial evaluation for mental status change, persistent or worsening. EXAM: MRI HEAD WITHOUT CONTRAST TECHNIQUE: Multiplanar, multiecho pulse sequences of the brain and surrounding structures were obtained without intravenous contrast. COMPARISON:  Prior head CT from 02/03/2020. FINDINGS: Brain: Diffuse prominence of the CSF containing spaces compatible with generalized cerebral atrophy, mildly advanced for age. Patchy T2/FLAIR hyperintensity within the periventricular deep white matter both cerebral hemispheres most consistent with chronic small vessel ischemic disease, mild in nature. No abnormal foci of restricted diffusion to suggest acute or subacute ischemia. Gray-white matter differentiation maintained. No encephalomalacia to suggest chronic cortical infarction. No foci of susceptibility artifact to suggest acute or chronic intracranial hemorrhage. No mass lesion, midline shift or mass effect. No hydrocephalus or extra-axial fluid collection. Pituitary gland suprasellar region within normal limits. Midline structures intact. Suspected small DVA noted at the right cerebellum. Vascular: Major intracranial vascular flow voids are maintained. Skull and upper cervical spine: Craniocervical junction within normal limits. Upper cervical spine normal. Bone marrow signal intensity within normal limits. No scalp soft tissue abnormality. Sinuses/Orbits: Globes and orbital soft tissues demonstrate no acute finding. Scattered mucosal thickening with trace layering fluid noted within the ethmoidal air cells, maxillary sinuses, and right sphenoid sinus. No mastoid effusion. Inner ear structures grossly normal. Other: None. IMPRESSION: 1. No acute intracranial abnormality. 2. Mildly advanced cerebral  atrophy for age with associated mild chronic small vessel ischemic disease. Electronically Signed   By: Rise Mu M.D.   On: 02/04/2020 23:08   DG Chest Port 1 View  Result Date: 02/04/2020 CLINICAL DATA:  COVID-19 EXAM: PORTABLE CHEST 1 VIEW COMPARISON:  01/28/2020 FINDINGS: Similar interstitial prominence. No new consolidation. No pleural effusion. No pneumothorax. IMPRESSION: Stable nonspecific mild interstitial prominence. No new consolidation. Electronically Signed   By: Guadlupe Spanish M.D.   On: 02/04/2020 08:53     Medications   Scheduled Meds: . amLODipine  5 mg Oral Daily  . vitamin C  500 mg Oral Daily  . aspirin EC  81 mg Oral Daily  . chlordiazePOXIDE  50 mg Oral Q8H  . dextromethorphan-guaiFENesin  1 tablet Oral BID  . enoxaparin (LOVENOX) injection  40 mg Subcutaneous Q24H  . folic acid  1 mg Intravenous Daily  . gabapentin  300 mg Oral TID  . LORazepam  0-4 mg Intravenous Q12H  . [START ON 02/06/2020] LORazepam  0-4 mg Oral Q12H  . magnesium oxide  400 mg Oral BID  . metoprolol tartrate  25 mg Oral BID  . multivitamin with minerals  1 tablet Oral Daily  . nicotine  21 mg Transdermal Daily  . senna-docusate  1 tablet Oral QHS  . zinc sulfate  220 mg Oral Daily   Continuous Infusions: . ampicillin-sulbactam (UNASYN) IV 3 g (02/05/20 1505)  . dextrose 5 % and 0.9% NaCl 75 mL/hr at 02/05/20 0951  . thiamine injection 500 mg (02/05/20 1213)       LOS: 7 days    Time spent: 40 minutes with > 50% spent in coordination of care and at bedside.    Timothea Bodenheimer Sherryll Burger, DO [facility]  02/05/2020, 3:10 PM    If 7PM-7AM, please contact night-coverage. How to contact  the [facility] Attending or Consulting provider 7A - 7P or covering provider during after hours 7P -7A, for this patient?    1. Check the care team in [facility] and look for a) attending/consulting [facility] provider listed and b) the [facility] team listed 2. Log into [email or web address] and use [place] universal  password to access. If you do not have the password, please contact the hospital operator. 3. Locate the [facility] provider you are looking for under [facility] and page to a number that you can be directly reached. 4. If you still have difficulty reaching the provider, please page the [facility] (Director on Call) for the Hospitalists listed on amion for assistance.

## 2020-02-06 DIAGNOSIS — U071 COVID-19: Secondary | ICD-10-CM | POA: Diagnosis not present

## 2020-02-06 DIAGNOSIS — F1010 Alcohol abuse, uncomplicated: Secondary | ICD-10-CM | POA: Diagnosis not present

## 2020-02-06 DIAGNOSIS — R945 Abnormal results of liver function studies: Secondary | ICD-10-CM | POA: Diagnosis not present

## 2020-02-06 DIAGNOSIS — K852 Alcohol induced acute pancreatitis without necrosis or infection: Secondary | ICD-10-CM | POA: Diagnosis not present

## 2020-02-06 LAB — CBC WITH DIFFERENTIAL/PLATELET
Abs Immature Granulocytes: 0.05 10*3/uL (ref 0.00–0.07)
Basophils Absolute: 0.1 10*3/uL (ref 0.0–0.1)
Basophils Relative: 0 %
Eosinophils Absolute: 0.1 10*3/uL (ref 0.0–0.5)
Eosinophils Relative: 1 %
HCT: 39.1 % (ref 39.0–52.0)
Hemoglobin: 13 g/dL (ref 13.0–17.0)
Immature Granulocytes: 0 %
Lymphocytes Relative: 7 %
Lymphs Abs: 0.8 10*3/uL (ref 0.7–4.0)
MCH: 31.5 pg (ref 26.0–34.0)
MCHC: 33.2 g/dL (ref 30.0–36.0)
MCV: 94.7 fL (ref 80.0–100.0)
Monocytes Absolute: 0.8 10*3/uL (ref 0.1–1.0)
Monocytes Relative: 7 %
Neutro Abs: 9.7 10*3/uL — ABNORMAL HIGH (ref 1.7–7.7)
Neutrophils Relative %: 85 %
Platelets: 290 10*3/uL (ref 150–400)
RBC: 4.13 MIL/uL — ABNORMAL LOW (ref 4.22–5.81)
RDW: 13.3 % (ref 11.5–15.5)
WBC: 11.4 10*3/uL — ABNORMAL HIGH (ref 4.0–10.5)
nRBC: 0 % (ref 0.0–0.2)

## 2020-02-06 LAB — COMPREHENSIVE METABOLIC PANEL
ALT: 48 U/L — ABNORMAL HIGH (ref 0–44)
AST: 94 U/L — ABNORMAL HIGH (ref 15–41)
Albumin: 2.1 g/dL — ABNORMAL LOW (ref 3.5–5.0)
Alkaline Phosphatase: 128 U/L — ABNORMAL HIGH (ref 38–126)
Anion gap: 10 (ref 5–15)
BUN: <5 mg/dL — ABNORMAL LOW (ref 8–23)
CO2: 25 mmol/L (ref 22–32)
Calcium: 8.9 mg/dL (ref 8.9–10.3)
Chloride: 112 mmol/L — ABNORMAL HIGH (ref 98–111)
Creatinine, Ser: 0.69 mg/dL (ref 0.61–1.24)
GFR, Estimated: >60 mL/min (ref >60)
Glucose, Bld: 110 mg/dL — ABNORMAL HIGH (ref 70–99)
Potassium: 3.2 mmol/L — ABNORMAL LOW (ref 3.5–5.1)
Sodium: 147 mmol/L — ABNORMAL HIGH (ref 135–145)
Total Bilirubin: 1.6 mg/dL — ABNORMAL HIGH (ref 0.3–1.2)
Total Protein: 6.1 g/dL — ABNORMAL LOW (ref 6.5–8.1)

## 2020-02-06 LAB — C-REACTIVE PROTEIN: CRP: 11.5 mg/dL — ABNORMAL HIGH (ref <1.0)

## 2020-02-06 MED ORDER — NEPRO/CARBSTEADY PO LIQD
237.0000 mL | Freq: Three times a day (TID) | ORAL | Status: DC
  Administered 2020-02-07 – 2020-02-08 (×3): 237 mL via ORAL

## 2020-02-06 MED ORDER — POTASSIUM CHLORIDE 10 MEQ/100ML IV SOLN
10.0000 meq | INTRAVENOUS | Status: AC
  Administered 2020-02-06 (×4): 10 meq via INTRAVENOUS
  Filled 2020-02-06 (×4): qty 100

## 2020-02-06 MED ORDER — CHLORDIAZEPOXIDE HCL 5 MG PO CAPS
10.0000 mg | ORAL_CAPSULE | Freq: Three times a day (TID) | ORAL | Status: DC
  Administered 2020-02-06 – 2020-02-07 (×2): 10 mg via ORAL
  Filled 2020-02-06 (×2): qty 2

## 2020-02-06 MED ORDER — POTASSIUM CHLORIDE CRYS ER 20 MEQ PO TBCR
40.0000 meq | EXTENDED_RELEASE_TABLET | Freq: Once | ORAL | Status: DC
  Filled 2020-02-06: qty 2

## 2020-02-06 NOTE — [place] FL2 (Signed)
[place] MEDICAID FL2 LEVEL OF CARE SCREENING TOOL     IDENTIFICATION  Patient Name: Thomas Coffey Birthdate: 1955/07/12 Sex: male Admission Date (Current Location): 01/28/2020  [place] Number:  Chiropodist and Address:  [facility], [address], [place], [place] [address]      Provider Number: [id number]  Attending Physician Name and Address:  Delfino Lovett, MD  Relative Name and Phone Number:  Aragorn, Recker (Son)   [phone] [facility])    Current Level of Care: Hospital Recommended Level of Care: Skilled Nursing Facility Prior Approval Number:    Date Approved/Denied:   PASRR Number: pending  Discharge Plan: SNF    Current Diagnoses: Patient Active Problem List   Diagnosis Date Noted  . COVID-19 01/29/2020  . COVID-19 virus infection 01/28/2020  . Abnormal LFTs 01/28/2020  . CAD (coronary artery disease) 01/28/2020  . Atrial fibrillation, chronic (HCC) 01/28/2020  . Acute respiratory disease due to COVID-19 virus 01/28/2020  . Tobacco abuse 01/28/2020  . Acute pancreatitis 07/20/2019  . Hypomagnesemia 11/24/2018  . Protein-calorie malnutrition, severe 11/23/2018  . Pleural effusion on left   . Liver function test abnormality   . AKI (acute kidney injury) (HCC)   . Atrial fibrillation with rapid ventricular response (HCC)   . Acute hepatitis 11/17/2018  . Atrial fibrillation with RVR (HCC) 11/17/2018  . Alcohol abuse 11/17/2018  . Tobacco dependence syndrome 11/17/2018  . Empyema (HCC)     Orientation RESPIRATION BLADDER Height & Weight     Self  Normal Incontinent,External catheter Weight: 160 lb 15 oz (73 kg) Height:  5\' 9"  (175.3 cm)  BEHAVIORAL SYMPTOMS/MOOD NEUROLOGICAL BOWEL NUTRITION STATUS  Other (Comment) (agitation at times)   Continent Diet (dys 1, nectar thick liquids)  AMBULATORY STATUS COMMUNICATION OF NEEDS Skin   Extensive Assist Verbally Normal                       Personal  Care Assistance Level of Assistance  Bathing,Feeding,Dressing Bathing Assistance: Maximum assistance Feeding assistance: Limited assistance Dressing Assistance: Maximum assistance     Functional Limitations Info             SPECIAL CARE FACTORS FREQUENCY  PT (By licensed PT),OT (By licensed OT)     PT Frequency: 5 x/week OT Frequency: 5 x/week            Contractures      Additional Factors Info  Code Status,Allergies Code Status Info: full code Allergies Info: no known allergies           Current Medications (02/06/2020):  This is the current hospital active medication list Current Facility-Administered Medications  Medication Dose Route Frequency Provider Last Rate Last Admin  . acetaminophen (TYLENOL) tablet 650 mg  650 mg Oral Q6H PRN 02/08/2020, MD      . albuterol (VENTOLIN HFA) 108 (90 Base) MCG/ACT inhaler 2 puff  2 puff Inhalation Q4H PRN Lorretta Harp, MD      . amLODipine (NORVASC) tablet 5 mg  5 mg Oral Daily Lorretta Harp A, DO   5 mg at 02/04/20 1225  . Ampicillin-Sulbactam (UNASYN) 3 g in sodium chloride 0.9 % 100 mL IVPB  3 g Intravenous Q6H Shah, Vipul, MD 200 mL/hr at 02/06/20 1016 3 g at 02/06/20 1016  . ascorbic acid (VITAMIN C) tablet 500 mg  500 mg Oral Daily 02/08/20, MD   500 mg at 02/06/20 1256  . aspirin EC tablet  81 mg  81 mg Oral Daily Lorretta Harp, MD   81 mg at 02/06/20 1256  . bisacodyl (DULCOLAX) EC tablet 5 mg  5 mg Oral Daily PRN Esaw Grandchild A, DO      . chlordiazePOXIDE (LIBRIUM) capsule 10 mg  10 mg Oral Q8H Shah, Vipul, MD      . dextromethorphan-guaiFENesin [facility] DM) 30-600 MG per 12 hr tablet 1 tablet  1 tablet Oral BID Esaw Grandchild A, DO   1 tablet at 02/06/20 1257  . dextrose 5 %-0.9 % sodium chloride infusion   Intravenous Continuous Pennie Banter, DO 75 mL/hr at 02/06/20 0241 New Bag at 02/06/20 0241  . enoxaparin (LOVENOX) injection 40 mg  40 mg Subcutaneous Q24H Lorretta Harp, MD   40 mg at 02/05/20 2245  . folic  acid injection 1 mg  1 mg Intravenous Daily Albina Billet, RPH   1 mg at 02/06/20 1017  . gabapentin (NEURONTIN) tablet 300 mg  300 mg Oral TID Esaw Grandchild A, DO   300 mg at 02/06/20 1256  . ipratropium (ATROVENT HFA) inhaler 2 puff  2 puff Inhalation Q6H PRN Esaw Grandchild A, DO      . LORazepam (ATIVAN) injection 0-4 mg  0-4 mg Intravenous Q12H Esaw Grandchild A, DO   2 mg at 02/06/20 1346  . LORazepam (ATIVAN) tablet 0-4 mg  0-4 mg Oral Q12H Esaw Grandchild A, DO      . magnesium oxide (MAG-OX) tablet 400 mg  400 mg Oral BID Esaw Grandchild A, DO   400 mg at 02/06/20 1256  . metoprolol tartrate (LOPRESSOR) tablet 25 mg  25 mg Oral BID Esaw Grandchild A, DO   25 mg at 02/06/20 8127  . morphine 2 MG/ML injection 2 mg  2 mg Intravenous Q3H PRN Lorretta Harp, MD   2 mg at 02/05/20 2330  . multivitamin with minerals tablet 1 tablet  1 tablet Oral Daily Lorretta Harp, MD   1 tablet at 02/03/20 1100  . nicotine (NICODERM CQ - dosed in mg/24 hours) patch 21 mg  21 mg Transdermal Daily Lorretta Harp, MD   21 mg at 02/06/20 1007  . ondansetron (ZOFRAN) injection 4 mg  4 mg Intravenous Q8H PRN Lorretta Harp, MD      . oxyCODONE (Oxy IR/ROXICODONE) immediate release tablet 5 mg  5 mg Oral Q4H PRN Lorretta Harp, MD   5 mg at 02/01/20 1528  . potassium chloride 10 mEq in 100 mL IVPB  10 mEq Intravenous Q1 Hr x 4 Sherryll Burger, Vipul, MD 100 mL/hr at 02/06/20 1525 10 mEq at 02/06/20 1525  . senna-docusate (Senokot-S) tablet 1 tablet  1 tablet Oral QHS Esaw Grandchild A, DO   1 tablet at 02/03/20 2220  . zinc sulfate capsule 220 mg  220 mg Oral Daily Lorretta Harp, MD   220 mg at 02/06/20 1256     Discharge Medications: Please see discharge summary for a list of discharge medications.  Relevant Imaging Results:  Relevant Lab Results:   Additional Information SS #: [id number]  Evann Erazo E Treanna Dumler, LCSW

## 2020-02-06 NOTE — Progress Notes (Addendum)
PROGRESS NOTE    Thomas Coffey   DQQ:[id number]  DOB: Nov 04, 1955  PCP: Patient, [name]    DOA: 01/28/2020 LOS: 8   Brief Narrative   Thomas Coffey is a 65 y.o. male with medical history significant of alcohol abuse, tobacco abuse, alcoholic pancreatitis, CAD, myocardial infarction, atrial fibrillation not on anticoagulants, who presented to the ED on 01/28/2020 with 10 days of cough and body aches, in addition to progressive abdominal pain with nausea vomiting since the day before.  Evaluation in the ED revealed acute pancreatitis with lipase 518.  COVID-19 positive.  Liver enzymes elevated.  Chest x-ray showed acute bronchitic changes.  1/19: Made n.p.o. by speech therapy due to significant decline in the cognitive status and unable to stay awake. 1/20: Tapering his Librium 50->20 mg 3 times daily to see if it helps his mental status 1/21: He is more awake today and speech therapy was able to start him on dysphagia 1 diet.  He does cough and likely aspirating silently - his long-term nutrition remains a challenge and concerning for worsening severe malnutrition. taper his Librium from 20->10 mg 3 times daily   Assessment & Plan   Principal Problem:   Acute respiratory disease due to COVID-19 virus Active Problems:   Alcohol abuse   Hypomagnesemia   Acute pancreatitis   Abnormal LFTs   CAD (coronary artery disease)   Atrial fibrillation, chronic (HCC)   Tobacco abuse   COVID-19   Acute metabolic encephalopathy -from alcohol withdrawal  CT head was negative for acute findings, showed mild diffuse cortical atrophy and mild small vessel ischemic changes. -- MRI brain neg for acute patho  Alcohol abuse / Uncomplicated Alcohol Withdrawal -withdrawal course ongoing, somewhat prolonged at this point.   Son reports pt very heavy drinker, all day every day, both beer and liquor for entire adult life. He was not aware of prior withdrawal or if hx of seizure or DT's -- Taper  Librium 50->25->10 mg TID  -- Ativan PRN per CIWA protocol -- continue gabapentin -- Overall uncomplicated withdrawal thus far. -- Minimize PRN Ativan as much as possible -- Folic acid supplement  Aspiration pneumonia-he is more awake today and able to start him on dysphagia 1 diet per speech therapy Due to rising Pro-Cal 0.11>> 0.94, in the setting of reports by nursing staff patient having difficulty swallowing. --continue IV Unasyn (day 3/5) considering he is not able to take p.o. -- Aspiration precautions  Inadequate p.o. intake -due to alcohol withdrawal, sedation from medications.  Started him on diet today.  Nutritionist following --D5-NS maintenance IV fluids until oral intake improves.  We will switch oral medications to IV if and when possible  Hypoglycemia -due to poor p.o. intake.  On D5-NS as above  Hypokalemia  / Hypomagnesemia -present on admission.  Both were replaced.   -- Monitor BMP and Mg daily and replace as needed.  He is at high risk for refeeding syndrome and electrolyte will need to be closely monitored  COVID-19 infection -presented with 10-day history of body aches and cough, tested positive in the ED.  Has not been hypoxic.  Chest x-ray showed bronchitic changes. --Completed remdesivir, Steroids started on admission were stopped given no hypoxia -- Scheduled Mucinex, as needed albuterol -- Vitamin C, zinc -- Completed 5 day course doxycycline for secondary bronchitis  Acute pancreatitis -seems resolved.  Present on admission, due to alcohol abuse.   -- Diet as tolerated, currently on full liquids with difficulty swallowing  due to sedation/withdrawal -- Resumed on IV fluids as above given inadequate p.o. intake -- Pain control per orders -- As needed Zofran  Sinus tachycardia - due to Etoh withdrawal most likely.  Now resolved on metoprolol  Hypertension - probably related to EtOH withdrawal.  Started on Lopressor given A-fib history and tachycardia.   BP  improved.  Monitor.  Tobacco abuse -nicotine patch.  Smoking cessation recommended.  Transaminitis -likely due to alcohol abuse.  Monitor CMP closely since on remdesivir. RUQ ultrasound showed likely hepatic steatosis, gall stones.  CAD -chronic, stable without active chest pain or acute ischemic changes on EKG.   -- Continue aspirin  A. fib, chronic - Has been normal sinus on tele, tachycardia related to alcohol withdrawal -- Not on anticoagulation or rate control meds -- Monitor on telemetry --Continue Lopressor for rate and BP    DVT prophylaxis: enoxaparin (LOVENOX) injection 40 mg Start: 01/28/20 2200   Diet:  Diet Orders (From admission, onward)    Start     Ordered   02/06/20 1137  DIET - DYS 1 Room service appropriate? Yes with Assist; Fluid consistency: Nectar Thick  Diet effective now       Comments: Extra Gravy on meats, potatoes. Yogurt TID meals, puddings. Pt may have Oatmeal per Speech w/ butter/sugar. Extra Spoon TID trays.  Question Answer Comment  Room service appropriate? Yes with Assist   Fluid consistency: Nectar Thick      02/06/20 1138            Code Status: Full Code    Subjective 02/06/20   Much more awake today.  Wanting to eat some.  Sitter at bedside Disposition Plan & Communication   Status is: Inpatient  Remains inpatient appropriate because:IV treatments appropriate due to intensity of illness or inability to take PO.  Ongoing alcohol withdrawal requiring inpatient management and close monitoring.  Inadequate PO intake.  Requires SNF placement.     Dispo: The patient is from: Home              Anticipated d/c is to: SNF              Anticipated d/c date is: 2-3 days              Patient currently is not medically stable to d/c.  Son is requesting to see if rehab at discharge is possible.  I have conveyed to [facility]  Family Communication: Updated [name] on 1/21    Consults, Procedures, Significant Events   Consultants:    Palliative care  Procedures:   None  Antimicrobials:  Anti-infectives (From admission, onward)   Start     Dose/Rate Route Frequency Ordered Stop   02/04/20 2200  Ampicillin-Sulbactam (UNASYN) 3 g in sodium chloride 0.9 % 100 mL IVPB        3 g 200 mL/hr over 30 Minutes Intravenous Every 6 hours 02/04/20 1320 02/07/20 2359   02/03/20 1015  amoxicillin-clavulanate (AUGMENTIN) 875-125 MG per tablet 1 tablet  Status:  Discontinued        1 tablet Oral Every 12 hours 02/03/20 0929 02/04/20 1320   01/29/20 1000  azithromycin (ZITHROMAX) tablet 250 mg  Status:  Discontinued       "Followed by" Linked Group Details   250 mg Oral Daily 01/28/20 1010 01/28/20 1025   01/29/20 1000  remdesivir 100 mg in sodium chloride 0.9 % 100 mL IVPB       "Followed by" Linked Group Details  100 mg 200 mL/hr over 30 Minutes Intravenous Daily 01/28/20 1024 02/02/20 0959   01/28/20 1200  remdesivir 200 mg in sodium chloride 0.9% 250 mL IVPB       "Followed by" Linked Group Details   200 mg 580 mL/hr over 30 Minutes Intravenous Once 01/28/20 1024 01/28/20 1318   01/28/20 1030  doxycycline (VIBRA-TABS) tablet 100 mg  Status:  Discontinued        100 mg Oral Every 12 hours 01/28/20 1025 02/01/20 1425   01/28/20 1015  azithromycin (ZITHROMAX) tablet 500 mg  Status:  Discontinued       "Followed by" Linked Group Details   500 mg Oral Daily 01/28/20 1010 01/28/20 1025        Objective   Vitals:   02/05/20 2354 02/06/20 0500 02/06/20 0639 02/06/20 1200  BP: 114/82  123/71 124/83  Pulse: 88  97 (!) 41  Resp: 20  20 19   Temp: 98 F (36.7 C)  98 F (36.7 C) (!) 97.2 F (36.2 C)  TempSrc:      SpO2: 98%  97% 90%  Weight:  73 kg    Height:       No intake or output data in the 24 hours ending 02/06/20 1505 Filed Weights   01/28/20 0532 02/06/20 0500  Weight: 77.1 kg 73 kg    Physical Exam: Sitter at bedside General exam: mumbling, no acute distress, laying in bed Respiratory system: CTAB  anteriorly, on room air, normal respiratory effort, no wheezing or rhonchi Cardiovascular system: RRR, no pedal edema   Gastrointestinal system: soft, non-tender, +bowel soudns Central nervous system: no gross focal neurologic deficits, awake and pleasantly confused Extremities: moves all, normal tone    Labs   Data Reviewed: I have personally reviewed following labs and imaging studies  CBC: Recent Labs  Lab 01/31/20 0706 02/01/20 0646 02/04/20 1434 02/05/20 0631 02/06/20 0624  WBC 8.4 9.2 10.3 10.8* 11.4*  NEUTROABS 7.1 7.5 7.6 9.1* 9.7*  HGB 14.6 14.8 15.6 15.1 13.0  HCT 42.2 43.7 47.2 44.8 39.1  MCV 91.9 91.8 96.1 93.9 94.7  PLT 175 191 240 276 290   Basic Metabolic Panel: Recent Labs  Lab 01/31/20 0706 02/01/20 0646 02/03/20 0540 02/04/20 1622 02/05/20 0631 02/06/20 0624  NA 135 136 140 142 144 147*  K 3.1* 4.2 3.8 4.0 3.7 3.2*  CL 103 106 104 108 107 112*  CO2 21* 20* 19* 24 26 25   GLUCOSE 60* 77 68* 109* 125* 110*  BUN 7* 7* 9 6* <5* <5*  CREATININE 0.48* 0.56* 0.62 0.58* 0.84 0.69  CALCIUM 8.6* 8.9 9.3 9.2 9.4 8.9  MG 1.6* 1.7 1.9 1.9  --   --   PHOS  --   --   --  3.5  --   --    GFR: Estimated Creatinine Clearance: 93.3 mL/min (by C-G formula based on SCr of 0.69 mg/dL). Liver Function Tests: Recent Labs  Lab 02/01/20 0646 02/03/20 0540 02/04/20 1622 02/05/20 0631 02/06/20 0624  AST 112* 138* 124* 105* 94*  ALT 39 55* 57* 54* 48*  ALKPHOS 159* 187* 165* 156* 128*  BILITOT 2.5* 3.6* 2.3* 2.3* 1.6*  PROT 6.2* 6.8 6.9 6.9 6.1*  ALBUMIN 2.4* 2.5* 2.5* 2.3* 2.1*   Recent Labs  Lab 01/31/20 0706 02/03/20 0540  LIPASE 59* 49   Recent Labs  Lab 02/03/20 1713  AMMONIA 52*   Coagulation Profile: No results for input(s): INR, PROTIME in the last 168 hours. Cardiac Enzymes:  No results for input(s): CKTOTAL, CKMB, CKMBINDEX, TROPONINI in the last 168 hours. BNP (last 3 results) No results for input(s): PROBNP in the last 8760  hours. HbA1C: No results for input(s): HGBA1C in the last 72 hours. CBG: No results for input(s): GLUCAP in the last 168 hours. Lipid Profile: No results for input(s): CHOL, HDL, LDLCALC, TRIG, CHOLHDL, LDLDIRECT in the last 72 hours. Thyroid Function Tests: No results for input(s): TSH, T4TOTAL, FREET4, T3FREE, THYROIDAB in the last 72 hours. Anemia Panel: Recent Labs    02/03/20 1713  VITAMINB12 258   Sepsis Labs: Recent Labs  Lab 02/03/20 0540 02/04/20 1622  PROCALCITON 0.94 0.76    No results found for this or any previous visit (from the past 240 hour(s)).    Imaging Studies   MR BRAIN WO CONTRAST  Result Date: 02/04/2020 CLINICAL DATA:  Initial evaluation for mental status change, persistent or worsening. EXAM: MRI HEAD WITHOUT CONTRAST TECHNIQUE: Multiplanar, multiecho pulse sequences of the brain and surrounding structures were obtained without intravenous contrast. COMPARISON:  Prior head CT from 02/03/2020. FINDINGS: Brain: Diffuse prominence of the CSF containing spaces compatible with generalized cerebral atrophy, mildly advanced for age. Patchy T2/FLAIR hyperintensity within the periventricular deep white matter both cerebral hemispheres most consistent with chronic small vessel ischemic disease, mild in nature. No abnormal foci of restricted diffusion to suggest acute or subacute ischemia. Gray-white matter differentiation maintained. No encephalomalacia to suggest chronic cortical infarction. No foci of susceptibility artifact to suggest acute or chronic intracranial hemorrhage. No mass lesion, midline shift or mass effect. No hydrocephalus or extra-axial fluid collection. Pituitary gland suprasellar region within normal limits. Midline structures intact. Suspected small DVA noted at the right cerebellum. Vascular: Major intracranial vascular flow voids are maintained. Skull and upper cervical spine: Craniocervical junction within normal limits. Upper cervical spine  normal. Bone marrow signal intensity within normal limits. No scalp soft tissue abnormality. Sinuses/Orbits: Globes and orbital soft tissues demonstrate no acute finding. Scattered mucosal thickening with trace layering fluid noted within the ethmoidal air cells, maxillary sinuses, and right sphenoid sinus. No mastoid effusion. Inner ear structures grossly normal. Other: None. IMPRESSION: 1. No acute intracranial abnormality. 2. Mildly advanced cerebral atrophy for age with associated mild chronic small vessel ischemic disease. Electronically Signed   By: Rise MuBenjamin  McClintock M.D.   On: 02/04/2020 23:08     Medications   Scheduled Meds: . amLODipine  5 mg Oral Daily  . vitamin C  500 mg Oral Daily  . aspirin EC  81 mg Oral Daily  . chlordiazePOXIDE  10 mg Oral Q8H  . dextromethorphan-guaiFENesin  1 tablet Oral BID  . enoxaparin (LOVENOX) injection  40 mg Subcutaneous Q24H  . folic acid  1 mg Intravenous Daily  . gabapentin  300 mg Oral TID  . LORazepam  0-4 mg Intravenous Q12H  . LORazepam  0-4 mg Oral Q12H  . magnesium oxide  400 mg Oral BID  . metoprolol tartrate  25 mg Oral BID  . multivitamin with minerals  1 tablet Oral Daily  . nicotine  21 mg Transdermal Daily  . senna-docusate  1 tablet Oral QHS  . zinc sulfate  220 mg Oral Daily   Continuous Infusions: . ampicillin-sulbactam (UNASYN) IV 3 g (02/06/20 1016)  . dextrose 5 % and 0.9% NaCl 75 mL/hr at 02/06/20 0241  . potassium chloride 10 mEq (02/06/20 1304)       LOS: 8 days    Time spent: 40 minutes with > 50% spent  in coordination of care and at bedside.    Yanina Knupp Sherryll Burger, DO [facility]  02/06/2020, 3:05 PM    If 7PM-7AM, please contact night-coverage. How to contact the [facility] Attending or Consulting provider 7A - 7P or covering provider during after hours 7P -7A, for this patient?    1. Check the care team in [facility] and look for a) attending/consulting TR[facility] provider listed and b) the [facility] team listed 2. Log into  [email or web address] and use [facility] universal password to access. If you do not have the password, please contact the hospital operator. 3. Locate the [facility] provider you are looking for under [facility] and page to a number that you can be directly reached. 4. If you still have difficulty reaching the provider, please page the [facility] (Director on Call) for the Hospitalists listed on amion for assistance.

## 2020-02-06 NOTE — Progress Notes (Signed)
  Speech Language Pathology Treatment: Dysphagia  Patient Details Name: Thomas Coffey MRN: [id number] DOB: 01/15/1956 Today's Date: 02/06/2020 Time: 1140-1230 SLP Time Calculation (min) (ACUTE ONLY): 50 min  Assessment / Plan / Recommendation Clinical Impression  Pt seen for ongoing assessment of swallowing. He appears improved and more alert today; verbally responsive but w/ mumbled speech; able to follow basic instructions w/ Mod+ cues. Pt is on RA; wbc min elevated. Poor insight and awareness of self in bed.   Pt explained general aspiration precautions and need for following them especially sitting upright for all oral intake; small bites/sips. Pt assisted w/ positioning d/t poor insight and overall weakness then given trials of nectar liquids, ice chips, purees. No immediate, overt clinical s/s of aspiration were noted w/ any consistency; respiratory status remained calm and unlabored, vocal quality clear b/t trials but low volume. Though suspect pt is at risk to present w/ delayed pharyngeal swallow initiation d/t overall Cognitive decline. Pt was fed by TSP to monitor/control bolus size for better oropharyngeal control. Oral phase deficits noted c/b lengthier oral phase time for bolus management and A-P transfer for swallowing; oral clearing achieved w/ all consistencies given Time.  Recommend trial upgrade to Dysphagia level 1 diet (puree) w/ gravies added to moisten foods; Nectar liquids for better oropharyngeal control and awareness overall to reduce risk for aspiration. Recommend general aspiration precautions; Pills Crushed in Puree; tray setup, positioning, and assistance feeding at meals. ST services will continue to f/u w/ pt for toleration of diet and education as needed. Trials to upgrade diet only when appropriate from a Cognitive standpoint. MD/NSG updated. Precautions posted at bedside.     HPI HPI: Pt is a 65 y.o. male with medical history significant of alcohol abuse, tobacco  abuse, alcoholic pancreatitis, CAD, myocardial infarction, atrial fibrillation not on anticoagulants, who presents with cough, abdominal pain.  COVID-19 positive.  Liver enzymes elevated currently. Chest x-ray showed acute bronchitic changes.  On CIWA protocol with as needed Ativan -- reduce Librium to 25 mg TID to reduce need for Ativan per MD notes.  Pt requires a Sitter d/t agitation and behavior.      SLP Plan  Continue with current plan of care       Recommendations  Diet recommendations: Dysphagia 1 (puree);Nectar-thick liquid Liquids provided via: Teaspoon Medication Administration: Crushed with puree (for safer swallowing) Supervision: Staff to assist with self feeding;Full supervision/cueing for compensatory strategies Compensations: Minimize environmental distractions;Slow rate;Small sips/bites;Follow solids with liquid Postural Changes and/or Swallow Maneuvers: Seated upright 90 degrees;Upright 30-60 min after meal                General recommendations:  (Dietician f/u) Oral Care Recommendations: Oral care BID;Oral care before and after PO;Staff/trained caregiver to provide oral care Follow up Recommendations: Skilled Nursing facility (TBD) SLP Visit Diagnosis: Dysphagia, oropharyngeal phase (R13.12) (Cognitive decline) Plan: Continue with current plan of care       GO                 Jerilynn Som, MS, CCC-SLP Speech Language Pathologist Rehab Services [phone] [facility] 02/06/2020, 4:49 PM

## 2020-02-06 NOTE — Progress Notes (Addendum)
Nutrition Follow-up  DOCUMENTATION CODES:   Severe malnutrition in context of social or environmental circumstances  INTERVENTION:  Provide Nepro Shake po TID, each supplement provides 425 kcal and 19 grams protein.   Provide Magic cup TID with meals, each supplement provides 290 kcal and 9 grams of protein.  Initiate 48 hour calorie count. RD will document outcome of calorie count after the weekend.  If PO intake is inadequate recommend placement of small-bore NGT (Dobbhoff) for initiation of tube feeds if this aligns with goals of care. If plan is for tube feeds recommend:  -Initiate Osmolite 1.5 Cal at 20 mL/hr and advance by 15 mL/hr every 12 hours to goal rate of 50 mL/hr per tube -Provide PROSource TF 45 mL BID per tube -Goal regimen provides 1880 kcal, 97 grams of protein, 912 mL H2O daily -Recommend free water flush of 150 mL Q4hrs per tube, which would provide 1812 mL H2O daily including water in TF regimen  Monitor magnesium, potassium, and phosphorus daily for at least 3 days, MD to replete as needed, as pt is at risk for refeeding syndrome given severe malnutrition.  NUTRITION DIAGNOSIS:   Severe Malnutrition related to social / environmental circumstances (EtOH abuse, suspected inadequate oral intake) as evidenced by severe fat depletion,severe muscle depletion.  Ongoing.   GOAL:   Patient will meet greater than or equal to 90% of their needs  Not met at this time.  MONITOR:   Diet advancement,PO intake,Supplement acceptance,Labs,Weight trends,I & O's  REASON FOR ASSESSMENT:   NPO/Clear Liquid Diet    ASSESSMENT:   65 year old male with PMHx of EtOH abuse, tobacco abuse, alcoholic pancreatitis, CAD, MI, A-fib not on anticoagulants admitted with [id number], acute metabolic encephalopathy most likely due to EtOH withdrawal, aspiration PNA, acute pancreatitis now resolved.  Diet was advanced to dysphagia 1 with nectar-thick liquids today by SLP. Patient remains  disoriented. RD will provide oral nutrition supplements. Will start calorie count over the weekend to monitor adequacy of intake. No meal documentation available in chart.  Medications reviewed and include: vitamin C 921 mg daily, folic acid 1 mg daily IV, magnesium oxide 400 mg BID, MVI daily, nicotine patch, senna-docusate 1 tablet QHS, zinc sulfate 220 mg daily, Unasyn, D5-NS at 75 mL/hr, potassium chloride 10 mEq IV x 4 today.  Labs reviewed: Sodium 147, Potassium 3.2, Chloride 112, BUN <5.  Diet Order:   Diet Order            DIET - DYS 1 Room service appropriate? Yes with Assist; Fluid consistency: Nectar Thick  Diet effective now                EDUCATION NEEDS:   No education needs have been identified at this time  Skin:  Skin Assessment: Reviewed RN Assessment  Last BM:  02/02/2020 - small type 7  Height:   Ht Readings from Last 1 Encounters:  01/28/20 5' 9"  (1.753 m)   Weight:   Wt Readings from Last 1 Encounters:  02/06/20 73 kg   Ideal Body Weight:  72.7 kg  BMI:  Body mass index is 23.77 kg/m.  Estimated Nutritional Needs:   Kcal:  1800-2000  Protein:  90-100 grams  Fluid:  1.8-2 L/day  Jacklynn Barnacle, MS, RD, LDN Pager number available on Amion

## 2020-02-06 NOTE — TOC Progression Note (Addendum)
Transition of Care [facility]) - Progression Note    Patient Details  Name: Thomas Coffey MRN: [id number] Date of Birth: 12/04/1955  Transition of [facility]) CM/SW Contact  Liliana Cline, LCSW Phone Number: 02/06/2020, 3:36 PM  Clinical Narrative:   Per MD, patient's son now agreeable to SNF. Patient still disoriented. CSW starting SNF work up. Family will need to have a back up plan in case unable to find SNF placement due to insurance and behaviors. Called and explained this to sons [place] and [place]. They verbalized understanding.   Expected Discharge Plan: Home w Home Health Services Barriers to Discharge: Continued Medical Work up  Expected Discharge Plan and Services Expected Discharge Plan: Home w Home Health Services       Living arrangements for the past 2 months: Single Family Home                                       Social Determinants of Health (SDOH) Interventions    Readmission Risk Interventions No flowsheet data found.

## 2020-02-06 NOTE — Progress Notes (Signed)
Physical Therapy Treatment Patient Details Name: Thomas Coffey MRN: [id number] DOB: Mar 04, 1955 Today's Date: 02/06/2020    History of Present Illness Thomas Coffey is a 65 y.o. male with medical history significant of alcohol abuse, tobacco abuse, alcoholic pancreatitis, CAD, myocardial infarction, atrial fibrillation not on anticoagulants, who presented to the ED on 01/28/2020 with 10 days of cough and body aches, in addition to progressive abdominal pain with nausea vomiting since the day before.     Evaluation in the ED revealed acute pancreatitis with lipase 518.  COVID-19 positive.  Liver enzymes elevated.  Chest x-ray showed acute bronchitic changes.Thomas Coffey is a 65 y.o. male with medical history significant of alcohol abuse, tobacco abuse, alcoholic pancreatitis, CAD, myocardial infarction, atrial fibrillation not on anticoagulants, who presented to the ED on 01/28/2020 with 10 days of cough and body aches, in addition to progressive abdominal pain with nausea vomiting since the day before. Evaluation in the ED revealed acute pancreatitis with lipase 518.  COVID-19 positive.  Liver enzymes elevated.  Chest x-ray showed acute bronchitic changes.    PT Comments    Pt received asleep in bed but with awakens when PT removes covers, sitter present in room. Once pt awakens he immediately begins to transfer to sitting EOB but requires mod assist to complete movement. pt impulsive throughout session, attempting sit<>stand without assistance, requiring multimodal cuing for safety & to sit PRN. Pt is able to sit<>stand x 2 with BUE HHA min assist +2, then provided pt with RW and pt requires max assist for proper hand placement on AD. Pt attempts to facilitate more upright posture with anterior pelvic shift during standing but very poor demo from pt. Pt keeps eyes closed throughout session despite movement. Pt is able to move LLE to initiate side step to R at EOB but unable to initiate moving RLE.  Attempted to have pt wash face with wet cloth placed in hand but pt unable. Pt with minimal verbalization at beginning of session but unable to understand pt 2/2 decreased volume. Pt assisted back to bed at end of session. Continue to recommend STR upon d/c from acute setting to maximize independence with functional mobility & reduce fall risk.    Follow Up Recommendations  SNF     Equipment Recommendations  None recommended by PT    Recommendations for Other Services       Precautions / Restrictions Precautions Precautions: Fall Restrictions Weight Bearing Restrictions: No    Mobility  Bed Mobility Overal bed mobility: Needs Assistance Bed Mobility: Supine to Sit;Sit to Supine     Supine to sit: Mod assist;HOB elevated Sit to supine: Max assist;+2 for physical assistance      Transfers Overall transfer level: Needs assistance Equipment used: 2 person hand held assist Transfers: Sit to/from Stand Sit to Stand: Min assist;Mod assist            Ambulation/Gait                 Stairs             Wheelchair Mobility    Modified Rankin (Stroke Patients Only)       Balance Overall balance assessment: Needs assistance Sitting-balance support: No upper extremity supported;Feet supported Sitting balance-Leahy Scale: Fair Sitting balance - Comments: supervision static sitting EOB   Standing balance support: Bilateral upper extremity supported Standing balance-Leahy Scale: Poor Standing balance comment: UE support for standing EOB  Cognition Arousal/Alertness: Lethargic Behavior During Therapy: Flat affect Overall Cognitive Status: Impaired/Different from baseline Area of Impairment: Orientation;Memory;Attention;Safety/judgement;Following commands;Problem solving;Awareness                 Orientation Level: Disoriented to;Place;Time;Situation   Memory: Decreased short-term memory;Decreased recall of  precautions Following Commands: Follows one step commands inconsistently;Follows one step commands with increased time Safety/Judgement: Decreased awareness of deficits;Decreased awareness of safety Awareness: Emergent;Anticipatory;Intellectual Problem Solving: Decreased initiation;Requires tactile cues;Requires verbal cues;Slow processing        Exercises      General Comments        Pertinent Vitals/Pain Pain Assessment: Faces Faces Pain Scale: No hurt    Home Living                      Prior Function            PT Goals (current goals can now be found in the care plan section) Acute Rehab PT Goals Patient Stated Goal: "get out of this place" PT Goal Formulation: With patient Time For Goal Achievement: 02/16/20 Potential to Achieve Goals: Fair Progress towards PT goals: Progressing toward goals    Frequency    Min 2X/week      PT Plan Current plan remains appropriate    Co-evaluation              AM-PAC PT "6 Clicks" Mobility   Outcome Measure  Help needed turning from your back to your side while in a flat bed without using bedrails?: A Lot Help needed moving from lying on your back to sitting on the side of a flat bed without using bedrails?: A Lot Help needed moving to and from a bed to a chair (including a wheelchair)?: Total Help needed standing up from a chair using your arms (e.g., wheelchair or bedside chair)?: A Lot Help needed to walk in hospital room?: Total Help needed climbing 3-5 steps with a railing? : Total 6 Click Score: 9    End of Session Equipment Utilized During Treatment: Gait belt Activity Tolerance: Patient limited by lethargy Patient left: in bed;with nursing/sitter in room Nurse Communication: Mobility status PT Visit Diagnosis: Unsteadiness on feet (R26.81);Difficulty in walking, not elsewhere classified (R26.2);Muscle weakness (generalized) (M62.81)     Time: 0093-8182 PT Time Calculation (min) (ACUTE  ONLY): 17 min  Charges:  $Therapeutic Activity: 8-22 mins                     Aleda Grana, PT, DPT 02/06/20, 3:41 PM    Sandi Mariscal 02/06/2020, 3:37 PM

## 2020-02-07 DIAGNOSIS — U071 COVID-19: Secondary | ICD-10-CM | POA: Diagnosis not present

## 2020-02-07 DIAGNOSIS — K852 Alcohol induced acute pancreatitis without necrosis or infection: Secondary | ICD-10-CM | POA: Diagnosis not present

## 2020-02-07 DIAGNOSIS — R945 Abnormal results of liver function studies: Secondary | ICD-10-CM | POA: Diagnosis not present

## 2020-02-07 DIAGNOSIS — F1010 Alcohol abuse, uncomplicated: Secondary | ICD-10-CM | POA: Diagnosis not present

## 2020-02-07 LAB — CBC WITH DIFFERENTIAL/PLATELET
Abs Immature Granulocytes: 0.04 10*3/uL (ref 0.00–0.07)
Basophils Absolute: 0.1 10*3/uL (ref 0.0–0.1)
Basophils Relative: 1 %
Eosinophils Absolute: 0.1 10*3/uL (ref 0.0–0.5)
Eosinophils Relative: 1 %
HCT: 44.9 % (ref 39.0–52.0)
Hemoglobin: 15.1 g/dL (ref 13.0–17.0)
Immature Granulocytes: 0 %
Lymphocytes Relative: 11 %
Lymphs Abs: 1.3 10*3/uL (ref 0.7–4.0)
MCH: 31.3 pg (ref 26.0–34.0)
MCHC: 33.6 g/dL (ref 30.0–36.0)
MCV: 93.2 fL (ref 80.0–100.0)
Monocytes Absolute: 0.7 10*3/uL (ref 0.1–1.0)
Monocytes Relative: 6 %
Neutro Abs: 9.6 10*3/uL — ABNORMAL HIGH (ref 1.7–7.7)
Neutrophils Relative %: 81 %
Platelets: 334 10*3/uL (ref 150–400)
RBC: 4.82 MIL/uL (ref 4.22–5.81)
RDW: 13.4 % (ref 11.5–15.5)
WBC: 11.8 10*3/uL — ABNORMAL HIGH (ref 4.0–10.5)
nRBC: 0 % (ref 0.0–0.2)

## 2020-02-07 LAB — COMPREHENSIVE METABOLIC PANEL
ALT: 58 U/L — ABNORMAL HIGH (ref 0–44)
AST: 119 U/L — ABNORMAL HIGH (ref 15–41)
Albumin: 2.5 g/dL — ABNORMAL LOW (ref 3.5–5.0)
Alkaline Phosphatase: 155 U/L — ABNORMAL HIGH (ref 38–126)
Anion gap: 12 (ref 5–15)
BUN: <5 mg/dL — ABNORMAL LOW (ref 8–23)
CO2: 24 mmol/L (ref 22–32)
Calcium: 9.2 mg/dL (ref 8.9–10.3)
Chloride: 111 mmol/L (ref 98–111)
Creatinine, Ser: 0.49 mg/dL — ABNORMAL LOW (ref 0.61–1.24)
GFR, Estimated: >60 mL/min (ref >60)
Glucose, Bld: 90 mg/dL (ref 70–99)
Potassium: 3.9 mmol/L (ref 3.5–5.1)
Sodium: 147 mmol/L — ABNORMAL HIGH (ref 135–145)
Total Bilirubin: 1.6 mg/dL — ABNORMAL HIGH (ref 0.3–1.2)
Total Protein: 7.2 g/dL (ref 6.5–8.1)

## 2020-02-07 LAB — VITAMIN B1: Vitamin B1 (Thiamine): 402.9 nmol/L — ABNORMAL HIGH (ref 66.5–200.0)

## 2020-02-07 MED ORDER — ACETAMINOPHEN 325 MG PO TABS
650.0000 mg | ORAL_TABLET | Freq: Four times a day (QID) | ORAL | Status: DC | PRN
  Administered 2020-02-14: 650 mg via ORAL
  Filled 2020-02-07 (×2): qty 2

## 2020-02-07 MED ORDER — CHLORDIAZEPOXIDE HCL 5 MG PO CAPS
5.0000 mg | ORAL_CAPSULE | Freq: Three times a day (TID) | ORAL | Status: DC | PRN
  Administered 2020-02-07: 16:00:00 5 mg via ORAL
  Filled 2020-02-07: qty 1

## 2020-02-07 MED ORDER — FOLIC ACID 1 MG PO TABS
1.0000 mg | ORAL_TABLET | Freq: Every day | ORAL | Status: DC
  Administered 2020-02-08 – 2020-02-14 (×4): 1 mg via ORAL
  Filled 2020-02-07 (×6): qty 1

## 2020-02-07 NOTE — Progress Notes (Signed)
PROGRESS NOTE    [name]   FWY:[id number]  DOB: 05-16-1955  PCP: Patient, [name]    DOA: 01/28/2020 LOS: 9   Brief Narrative   Thomas Coffey is a 65 y.o. male with medical history significant of alcohol abuse, tobacco abuse, alcoholic pancreatitis, CAD, myocardial infarction, atrial fibrillation not on anticoagulants, who presented to the ED on 01/28/2020 with 10 days of cough and body aches, in addition to progressive abdominal pain with nausea vomiting since the day before.  Evaluation in the ED revealed acute pancreatitis with lipase 518.  COVID-19 positive.  Liver enzymes elevated.  Chest x-ray showed acute bronchitic changes.  1/19: Made n.p.o. by speech therapy due to significant decline in the cognitive status and unable to stay awake. 1/20: Tapering his Librium 50->20 mg 3 times daily to see if it helps his mental status 1/21: He is more awake today and speech therapy was able to start him on dysphagia 1 diet.  He does cough and likely aspirating silently - his long-term nutrition remains a challenge and concerning for worsening severe malnutrition. taper his Librium from 20->10 mg 3 times daily 1/22: Per nursing report he has been more awake and was able to eat yesterday.  Did require some Ativan this morning for agitation.  We will continue tapering Librium 10->5 mg TID as needed   Assessment & Plan   Principal Problem:   Acute respiratory disease due to COVID-19 virus Active Problems:   Alcohol abuse   Hypomagnesemia   Acute pancreatitis   Abnormal LFTs   CAD (coronary artery disease)   Atrial fibrillation, chronic (HCC)   Tobacco abuse   COVID-19   Acute metabolic encephalopathy -from alcohol withdrawal  CT head and MRI brain negative for any acute findings  Alcohol abuse / Uncomplicated Alcohol Withdrawal - Son reports pt very heavy drinker, all day every day, both beer and liquor for entire adult life. He was not aware of prior withdrawal or if  hx of seizure or DT's -- Taper Librium 50->25->10->5 mg TID prn -- Ativan PRN per CIWA protocol -- continue gabapentin -- Overall uncomplicated withdrawal thus far. -- Minimize PRN Ativan as much as possible -- Folic acid supplement  Aspiration pneumonia-he is more awake today and able to start him on dysphagia 1 diet per speech therapy Due to rising Pro-Cal 0.11>> 0.94, in the setting of reports by nursing staff patient having difficulty swallowing. --continue IV Unasyn (day 4/5)  -- Aspiration precautions  Inadequate p.o. intake -due to alcohol withdrawal, sedation from medications.  He started diet on 1/21  Hypoglycemia -due to poor p.o. intake.  On D5-NS   Hypokalemia  / Hypomagnesemia -present on admission.  Replete and recheck  COVID-19 infection -presented with 10-day history of body aches and cough, tested positive in the ED.  Has not been hypoxic.  Chest x-ray showed bronchitic changes. --Completed remdesivir, Steroids started on admission were stopped given no hypoxia -- Scheduled Mucinex, as needed albuterol -- Vitamin C, zinc -- Completed 5 day course doxycycline for secondary bronchitis  Acute pancreatitis -seems resolved.  Present on admission, due to alcohol abuse.   -- Diet as tolerated -- As needed Zofran  Sinus tachycardia - due to Etoh withdrawal most likely.  Now resolved on metoprolol  Hypertension - probably related to EtOH withdrawal. Lopressor given A-fib history and tachycardia.   BP improved.  Monitor.  Tobacco abuse -nicotine patch.  Smoking cessation recommended.  Transaminitis -likely due to alcohol abuse.  Monitor CMP closely since on remdesivir. RUQ ultrasound showed likely hepatic steatosis, gall stones.  CAD -chronic, stable without active chest pain or acute ischemic changes on EKG.   -- Continue aspirin  A. fib, chronic - Has been normal sinus on tele, tachycardia related to alcohol withdrawal -- Not on anticoagulation or rate control  meds -- Monitor on telemetry --Continue Lopressor for rate and BP    DVT prophylaxis: enoxaparin (LOVENOX) injection 40 mg Start: 01/28/20 2200   Diet:  Diet Orders (From admission, onward)    Start     Ordered   02/06/20 1137  DIET - DYS 1 Room service appropriate? Yes with Assist; Fluid consistency: Nectar Thick  Diet effective now       Comments: Extra Gravy on meats, potatoes. Yogurt TID meals, puddings. Pt may have Oatmeal per Speech w/ butter/sugar. Extra Spoon TID trays.  Question Answer Comment  Room service appropriate? Yes with Assist   Fluid consistency: Nectar Thick      02/06/20 1138            Code Status: Full Code    Subjective 02/07/20   He is sleepy as received Ativan earlier for agitation last night.  Sitter at bedside Disposition Plan & Communication   Status is: Inpatient  Remains inpatient appropriate because:IV treatments appropriate due to intensity of illness or inability to take PO.  Ongoing alcohol withdrawal requiring inpatient management and close monitoring.  Inadequate PO intake.  Requires SNF placement.     Dispo: The patient is from: Home              Anticipated d/c is to: SNF              Anticipated d/c date is: 2-3 days              Patient currently is not medically stable to d/c.  Son is requesting to see if rehab at discharge is possible.  I have conveyed to [facility]  Family Communication: Updated [name] on 1/21    Consults, Procedures, Significant Events   Consultants:   Palliative care  Procedures:   None  Antimicrobials:  Anti-infectives (From admission, onward)   Start     Dose/Rate Route Frequency Ordered Stop   02/04/20 2200  Ampicillin-Sulbactam (UNASYN) 3 g in sodium chloride 0.9 % 100 mL IVPB        3 g 200 mL/hr over 30 Minutes Intravenous Every 6 hours 02/04/20 1320 02/07/20 2359   02/03/20 1015  amoxicillin-clavulanate (AUGMENTIN) 875-125 MG per tablet 1 tablet  Status:  Discontinued        1 tablet Oral  Every 12 hours 02/03/20 0929 02/04/20 1320   01/29/20 1000  azithromycin (ZITHROMAX) tablet 250 mg  Status:  Discontinued       "Followed by" Linked Group Details   250 mg Oral Daily 01/28/20 1010 01/28/20 1025   01/29/20 1000  remdesivir 100 mg in sodium chloride 0.9 % 100 mL IVPB       "Followed by" Linked Group Details   100 mg 200 mL/hr over 30 Minutes Intravenous Daily 01/28/20 1024 02/02/20 0959   01/28/20 1200  remdesivir 200 mg in sodium chloride 0.9% 250 mL IVPB       "Followed by" Linked Group Details   200 mg 580 mL/hr over 30 Minutes Intravenous Once 01/28/20 1024 01/28/20 1318   01/28/20 1030  doxycycline (VIBRA-TABS) tablet 100 mg  Status:  Discontinued        100  mg Oral Every 12 hours 01/28/20 1025 02/01/20 1425   01/28/20 1015  azithromycin (ZITHROMAX) tablet 500 mg  Status:  Discontinued       "Followed by" Linked Group Details   500 mg Oral Daily 01/28/20 1010 01/28/20 1025        Objective   Vitals:   02/06/20 2006 02/07/20 0017 02/07/20 0511 02/07/20 0807  BP: (!) 114/101 (!) 133/94 (!) 147/88 (!) 146/91  Pulse: 85 91 86 80  Resp: 17 17 18 18   Temp: 97.6 F (36.4 C) 97.9 F (36.6 C) 98.1 F (36.7 C) 97.8 F (36.6 C)  TempSrc: Axillary  Oral   SpO2: 97% 100% 98% 100%  Weight:      Height:        Intake/Output Summary (Last 24 hours) at 02/07/2020 1226 Last data filed at 02/07/2020 0242 Gross per 24 hour  Intake 4487.24 ml  Output --  Net 4487.24 ml   Filed Weights   01/28/20 0532 02/06/20 0500  Weight: 77.1 kg 73 kg    Physical Exam: Sitter at bedside General exam: no acute distress, laying in bed Respiratory system: CTAB anteriorly, on room air, normal respiratory effort, no wheezing or rhonchi Cardiovascular system: RRR, no pedal edema   Gastrointestinal system: soft, non-tender, +bowel soudns Central nervous system: no gross focal neurologic deficits, sleepy Extremities: moves all, normal tone Labs   Data Reviewed: I have personally  reviewed following labs and imaging studies  CBC: Recent Labs  Lab 02/01/20 0646 02/04/20 1434 02/05/20 0631 02/06/20 0624 02/07/20 0625  WBC 9.2 10.3 10.8* 11.4* 11.8*  NEUTROABS 7.5 7.6 9.1* 9.7* 9.6*  HGB 14.8 15.6 15.1 13.0 15.1  HCT 43.7 47.2 44.8 39.1 44.9  MCV 91.8 96.1 93.9 94.7 93.2  PLT 191 240 276 290 334   Basic Metabolic Panel: Recent Labs  Lab 02/01/20 0646 02/03/20 0540 02/04/20 1622 02/05/20 0631 02/06/20 0624 02/07/20 0625  NA 136 140 142 144 147* 147*  K 4.2 3.8 4.0 3.7 3.2* 3.9  CL 106 104 108 107 112* 111  CO2 20* 19* 24 26 25 24   GLUCOSE 77 68* 109* 125* 110* 90  BUN 7* 9 6* <5* <5* <5*  CREATININE 0.56* 0.62 0.58* 0.84 0.69 0.49*  CALCIUM 8.9 9.3 9.2 9.4 8.9 9.2  MG 1.7 1.9 1.9  --   --   --   PHOS  --   --  3.5  --   --   --    GFR: Estimated Creatinine Clearance: 93.3 mL/min (A) (by C-G formula based on SCr of 0.49 mg/dL (L)). Liver Function Tests: Recent Labs  Lab 02/03/20 0540 02/04/20 1622 02/05/20 0631 02/06/20 0624 02/07/20 0625  AST 138* 124* 105* 94* 119*  ALT 55* 57* 54* 48* 58*  ALKPHOS 187* 165* 156* 128* 155*  BILITOT 3.6* 2.3* 2.3* 1.6* 1.6*  PROT 6.8 6.9 6.9 6.1* 7.2  ALBUMIN 2.5* 2.5* 2.3* 2.1* 2.5*   Recent Labs  Lab 02/03/20 0540  LIPASE 49   Recent Labs  Lab 02/03/20 1713  AMMONIA 52*   Coagulation Profile: No results for input(s): INR, PROTIME in the last 168 hours. Cardiac Enzymes: No results for input(s): CKTOTAL, CKMB, CKMBINDEX, TROPONINI in the last 168 hours. BNP (last 3 results) No results for input(s): PROBNP in the last 8760 hours. HbA1C: No results for input(s): HGBA1C in the last 72 hours. CBG: No results for input(s): GLUCAP in the last 168 hours. Lipid Profile: No results for input(s): CHOL, HDL, LDLCALC,  TRIG, CHOLHDL, LDLDIRECT in the last 72 hours. Thyroid Function Tests: No results for input(s): TSH, T4TOTAL, FREET4, T3FREE, THYROIDAB in the last 72 hours. Anemia Panel: No results  for input(s): VITAMINB12, FOLATE, FERRITIN, TIBC, IRON, RETICCTPCT in the last 72 hours. Sepsis Labs: Recent Labs  Lab 02/03/20 0540 02/04/20 1622  PROCALCITON 0.94 0.76    No results found for this or any previous visit (from the past 240 hour(s)).    Imaging Studies   No results found.   Medications   Scheduled Meds: . amLODipine  5 mg Oral Daily  . vitamin C  500 mg Oral Daily  . aspirin EC  81 mg Oral Daily  . dextromethorphan-guaiFENesin  1 tablet Oral BID  . enoxaparin (LOVENOX) injection  40 mg Subcutaneous Q24H  . feeding supplement (NEPRO CARB STEADY)  237 mL Oral TID BM  . folic acid  1 mg Oral Daily  . gabapentin  300 mg Oral TID  . LORazepam  0-4 mg Oral Q12H  . magnesium oxide  400 mg Oral BID  . metoprolol tartrate  25 mg Oral BID  . multivitamin with minerals  1 tablet Oral Daily  . nicotine  21 mg Transdermal Daily  . senna-docusate  1 tablet Oral QHS  . zinc sulfate  220 mg Oral Daily   Continuous Infusions: . ampicillin-sulbactam (UNASYN) IV 3 g (02/07/20 1220)  . dextrose 5 % and 0.9% NaCl Stopped (02/06/20 1840)       LOS: 9 days    Time spent: 40 minutes with > 50% spent in coordination of care and at bedside.    Kriss Ishler Sherryll Burger, DO [facility]  02/07/2020, 12:26 PM    If 7PM-7AM, please contact night-coverage. How to contact the [facility] Attending or Consulting provider 7A - 7P or covering provider during after hours 7P -7A, for this patient?    1. Check the care team in [facility] and look for a) attending/consulting [facility] provider listed and b) the [facility] team listed 2. Log into [email or web address] and use [facility] universal password to access. If you do not have the password, please contact the hospital operator. 3. Locate the [facility] provider you are looking for under [facility] and page to a number that you can be directly reached. 4. If you still have difficulty reaching the provider, please page the [facility] (Director on Call) for the  Hospitalists listed on amion for assistance.

## 2020-02-08 DIAGNOSIS — R945 Abnormal results of liver function studies: Secondary | ICD-10-CM | POA: Diagnosis not present

## 2020-02-08 DIAGNOSIS — K852 Alcohol induced acute pancreatitis without necrosis or infection: Secondary | ICD-10-CM | POA: Diagnosis not present

## 2020-02-08 DIAGNOSIS — U071 COVID-19: Secondary | ICD-10-CM | POA: Diagnosis not present

## 2020-02-08 DIAGNOSIS — F1010 Alcohol abuse, uncomplicated: Secondary | ICD-10-CM | POA: Diagnosis not present

## 2020-02-08 LAB — COMPREHENSIVE METABOLIC PANEL
ALT: 53 U/L — ABNORMAL HIGH (ref 0–44)
AST: 102 U/L — ABNORMAL HIGH (ref 15–41)
Albumin: 2 g/dL — ABNORMAL LOW (ref 3.5–5.0)
Alkaline Phosphatase: 138 U/L — ABNORMAL HIGH (ref 38–126)
Anion gap: 11 (ref 5–15)
BUN: <5 mg/dL — ABNORMAL LOW (ref 8–23)
CO2: 23 mmol/L (ref 22–32)
Calcium: 8.5 mg/dL — ABNORMAL LOW (ref 8.9–10.3)
Chloride: 111 mmol/L (ref 98–111)
Creatinine, Ser: 0.59 mg/dL — ABNORMAL LOW (ref 0.61–1.24)
GFR, Estimated: >60 mL/min (ref >60)
Glucose, Bld: 135 mg/dL — ABNORMAL HIGH (ref 70–99)
Potassium: 3.2 mmol/L — ABNORMAL LOW (ref 3.5–5.1)
Sodium: 145 mmol/L (ref 135–145)
Total Bilirubin: 1.2 mg/dL (ref 0.3–1.2)
Total Protein: 6.1 g/dL — ABNORMAL LOW (ref 6.5–8.1)

## 2020-02-08 LAB — CBC WITH DIFFERENTIAL/PLATELET
Abs Immature Granulocytes: 0.07 10*3/uL (ref 0.00–0.07)
Basophils Absolute: 0.1 10*3/uL (ref 0.0–0.1)
Basophils Relative: 1 %
Eosinophils Absolute: 0.1 10*3/uL (ref 0.0–0.5)
Eosinophils Relative: 1 %
HCT: 41.7 % (ref 39.0–52.0)
Hemoglobin: 14.3 g/dL (ref 13.0–17.0)
Immature Granulocytes: 1 %
Lymphocytes Relative: 8 %
Lymphs Abs: 0.9 10*3/uL (ref 0.7–4.0)
MCH: 31.7 pg (ref 26.0–34.0)
MCHC: 34.3 g/dL (ref 30.0–36.0)
MCV: 92.5 fL (ref 80.0–100.0)
Monocytes Absolute: 0.8 10*3/uL (ref 0.1–1.0)
Monocytes Relative: 7 %
Neutro Abs: 10 10*3/uL — ABNORMAL HIGH (ref 1.7–7.7)
Neutrophils Relative %: 82 %
Platelets: 304 10*3/uL (ref 150–400)
RBC: 4.51 MIL/uL (ref 4.22–5.81)
RDW: 13.5 % (ref 11.5–15.5)
WBC: 11.9 10*3/uL — ABNORMAL HIGH (ref 4.0–10.5)
nRBC: 0 % (ref 0.0–0.2)

## 2020-02-08 LAB — MAGNESIUM: Magnesium: 1.7 mg/dL (ref 1.7–2.4)

## 2020-02-08 MED ORDER — POTASSIUM CHLORIDE CRYS ER 20 MEQ PO TBCR
40.0000 meq | EXTENDED_RELEASE_TABLET | Freq: Once | ORAL | Status: AC
  Administered 2020-02-08: 40 meq via ORAL
  Filled 2020-02-08: qty 2

## 2020-02-08 NOTE — Progress Notes (Signed)
PROGRESS NOTE    Thomas Coffey   BUL:[id number]  DOB: Jun 01, 1955  PCP: Patient, [name]    DOA: 01/28/2020 LOS: 10   Brief Narrative   Thomas Coffey is a 65 y.o. male with medical history significant of alcohol abuse, tobacco abuse, alcoholic pancreatitis, CAD, myocardial infarction, atrial fibrillation not on anticoagulants, who presented to the ED on 01/28/2020 with 10 days of cough and body aches, in addition to progressive abdominal pain with nausea vomiting since the day before.  Evaluation in the ED revealed acute pancreatitis with lipase 518.  COVID-19 positive.  Liver enzymes elevated.  Chest x-ray showed acute bronchitic changes.  1/19: Made n.p.o. by speech therapy due to significant decline in the cognitive status and unable to stay awake. 1/20: Tapering his Librium 50->20 mg 3 times daily to see if it helps his mental status 1/21: He is more awake today and speech therapy was able to start him on dysphagia 1 diet.  He does cough and likely aspirating silently - his long-term nutrition remains a challenge and concerning for worsening severe malnutrition. taper his Librium from 20->10 mg 3 times daily 1/22: Per nursing report he has been more awake and was able to eat yesterday.  Did require some Ativan this morning for agitation.  We will continue tapering Librium 10->5 mg TID as needed 1/23 -discontinuing Librium and CIWA protocol. trial off any sedation.  Waiting for SNF   Assessment & Plan   Principal Problem:   Acute respiratory disease due to COVID-19 virus Active Problems:   Alcohol abuse   Hypomagnesemia   Acute pancreatitis   Abnormal LFTs   CAD (coronary artery disease)   Atrial fibrillation, chronic (HCC)   Tobacco abuse   COVID-19   Acute metabolic encephalopathy -from alcohol withdrawal  CT head and MRI brain negative for any acute findings  Alcohol abuse / Uncomplicated Alcohol Withdrawal - Son reports pt very heavy drinker, all day every day,  both beer and liquor for entire adult life. He was not aware of prior withdrawal or if hx of seizure or DT's --He is tapered off Librium.  Stopped today on 1/23 --I will discontinue as needed Ativan to give him wake-up trial -- continue gabapentin  Aspiration pneumonia-he is more awake today and able to start him on dysphagia 1 diet per speech therapy Due to rising Pro-Cal 0.11>> 0.94, in the setting of reports by nursing staff patient having difficulty swallowing. --Stop IV Unasyn (day 5/5) after today -- Aspiration precautions  Inadequate p.o. intake -due to alcohol withdrawal, sedation from medications.  started diet on 1/21  Hypoglycemia -due to poor p.o. intake.  On D5-NS   Hypokalemia  / Hypomagnesemia -present on admission.  Repleted  COVID-19 infection -presented with 10-day history of body aches and cough, tested positive in the ED.  Has not been hypoxic.  Chest x-ray showed bronchitic changes. --Completed remdesivir, Steroids started on admission were stopped given no hypoxia -- Scheduled Mucinex, as needed albuterol -- Vitamin C, zinc -- Completed 5 day course doxycycline for secondary bronchitis  Acute pancreatitis -seems resolved.  Present on admission, due to alcohol abuse.   -- Diet as tolerated -- As needed Zofran  Sinus tachycardia - due to Etoh withdrawal most likely.  Now resolved on metoprolol  Hypertension - probably related to EtOH withdrawal. Lopressor given A-fib history and tachycardia.   BP improved.  Monitor.  Tobacco abuse -nicotine patch.  Smoking cessation recommended.  Transaminitis -likely due to alcohol  abuse.  Monitor CMP closely since on remdesivir. RUQ ultrasound showed likely hepatic steatosis, gall stones.  CAD -chronic, stable without active chest pain or acute ischemic changes on EKG.   -- Continue aspirin  A. fib, chronic - Has been normal sinus on tele, tachycardia related to alcohol withdrawal -- Not on anticoagulation or rate  control meds -- Monitor on telemetry --Continue Lopressor for rate and BP    DVT prophylaxis: enoxaparin (LOVENOX) injection 40 mg Start: 01/28/20 2200   Diet:  Diet Orders (From admission, onward)    Start     Ordered   02/06/20 1137  DIET - DYS 1 Room service appropriate? Yes with Assist; Fluid consistency: Nectar Thick  Diet effective now       Comments: Extra Gravy on meats, potatoes. Yogurt TID meals, puddings. Pt may have Oatmeal per Speech w/ butter/sugar. Extra Spoon TID trays.  Question Answer Comment  Room service appropriate? Yes with Assist   Fluid consistency: Nectar Thick      02/06/20 1138            Code Status: Full Code    Subjective 02/08/20   He is sleepy as received Ativan earlier for agitation last night.  Sitter at bedside.  No new issues reported Disposition Plan & Communication   Status is: Inpatient  Remains inpatient appropriate because:IV treatments appropriate due to intensity of illness or inability to take PO.  Ongoing alcohol withdrawal requiring inpatient management and close monitoring.  Inadequate PO intake.  Requires SNF placement.     Dispo: The patient is from: Home              Anticipated d/c is to: SNF              Anticipated d/c date is: 2-3 days              Patient currently is not medically stable to d/c.  Son is requesting to see if rehab at discharge is possible.  I have conveyed to [facility]  Family Communication: Updated [name] on 1/23    Consults, Procedures, Significant Events   Consultants:   Palliative care  Procedures:   None  Antimicrobials:  Anti-infectives (From admission, onward)   Start     Dose/Rate Route Frequency Ordered Stop   02/04/20 2200  Ampicillin-Sulbactam (UNASYN) 3 g in sodium chloride 0.9 % 100 mL IVPB        3 g 200 mL/hr over 30 Minutes Intravenous Every 6 hours 02/04/20 1320 02/07/20 2359   02/03/20 1015  amoxicillin-clavulanate (AUGMENTIN) 875-125 MG per tablet 1 tablet  Status:   Discontinued        1 tablet Oral Every 12 hours 02/03/20 0929 02/04/20 1320   01/29/20 1000  azithromycin (ZITHROMAX) tablet 250 mg  Status:  Discontinued       "Followed by" Linked Group Details   250 mg Oral Daily 01/28/20 1010 01/28/20 1025   01/29/20 1000  remdesivir 100 mg in sodium chloride 0.9 % 100 mL IVPB       "Followed by" Linked Group Details   100 mg 200 mL/hr over 30 Minutes Intravenous Daily 01/28/20 1024 02/02/20 0959   01/28/20 1200  remdesivir 200 mg in sodium chloride 0.9% 250 mL IVPB       "Followed by" Linked Group Details   200 mg 580 mL/hr over 30 Minutes Intravenous Once 01/28/20 1024 01/28/20 1318   01/28/20 1030  doxycycline (VIBRA-TABS) tablet 100 mg  Status:  Discontinued  100 mg Oral Every 12 hours 01/28/20 1025 02/01/20 1425   01/28/20 1015  azithromycin (ZITHROMAX) tablet 500 mg  Status:  Discontinued       "Followed by" Linked Group Details   500 mg Oral Daily 01/28/20 1010 01/28/20 1025        Objective   Vitals:   02/08/20 0155 02/08/20 0459 02/08/20 0729 02/08/20 1200  BP: 120/80 133/82 (!) 112/99 125/78  Pulse: (!) 109 100 (!) 102 94  Resp: 18 17 18 16   Temp:  98.3 F (36.8 C) 98.1 F (36.7 C) 98.1 F (36.7 C)  TempSrc:  Oral Oral Axillary  SpO2: 100% 95% 100%   Weight:      Height:        Intake/Output Summary (Last 24 hours) at 02/08/2020 1439 Last data filed at 02/08/2020 0500 Gross per 24 hour  Intake 114.3 ml  Output --  Net 114.3 ml   Filed Weights   01/28/20 0532 02/06/20 0500  Weight: 77.1 kg 73 kg    Physical Exam: Sitter at bedside General exam: no acute distress, laying in bed Respiratory system: CTAB anteriorly, on room air, normal respiratory effort, no wheezing or rhonchi Cardiovascular system: RRR, no pedal edema   Gastrointestinal system: soft, non-tender, +bowel soudns Central nervous system: no gross focal neurologic deficits, sleepy Extremities: moves all, normal tone Labs   Data Reviewed: I have  personally reviewed following labs and imaging studies  CBC: Recent Labs  Lab 02/04/20 1434 02/05/20 0631 02/06/20 0624 02/07/20 0625 02/08/20 1017  WBC 10.3 10.8* 11.4* 11.8* 11.9*  NEUTROABS 7.6 9.1* 9.7* 9.6* 10.0*  HGB 15.6 15.1 13.0 15.1 14.3  HCT 47.2 44.8 39.1 44.9 41.7  MCV 96.1 93.9 94.7 93.2 92.5  PLT 240 276 290 334 304   Basic Metabolic Panel: Recent Labs  Lab 02/03/20 0540 02/04/20 1622 02/05/20 0631 02/06/20 0624 02/07/20 0625 02/08/20 1017 02/08/20 1243  NA 140 142 144 147* 147* 145  --   K 3.8 4.0 3.7 3.2* 3.9 3.2*  --   CL 104 108 107 112* 111 111  --   CO2 19* 24 26 25 24 23   --   GLUCOSE 68* 109* 125* 110* 90 135*  --   BUN 9 6* <5* <5* <5* <5*  --   CREATININE 0.62 0.58* 0.84 0.69 0.49* 0.59*  --   CALCIUM 9.3 9.2 9.4 8.9 9.2 8.5*  --   MG 1.9 1.9  --   --   --   --  1.7  PHOS  --  3.5  --   --   --   --   --    GFR: Estimated Creatinine Clearance: 93.3 mL/min (A) (by C-G formula based on SCr of 0.59 mg/dL (L)). Liver Function Tests: Recent Labs  Lab 02/04/20 1622 02/05/20 0631 02/06/20 0624 02/07/20 0625 02/08/20 1017  AST 124* 105* 94* 119* 102*  ALT 57* 54* 48* 58* 53*  ALKPHOS 165* 156* 128* 155* 138*  BILITOT 2.3* 2.3* 1.6* 1.6* 1.2  PROT 6.9 6.9 6.1* 7.2 6.1*  ALBUMIN 2.5* 2.3* 2.1* 2.5* 2.0*   Recent Labs  Lab 02/03/20 0540  LIPASE 49   Recent Labs  Lab 02/03/20 1713  AMMONIA 52*   Coagulation Profile: No results for input(s): INR, PROTIME in the last 168 hours. Cardiac Enzymes: No results for input(s): CKTOTAL, CKMB, CKMBINDEX, TROPONINI in the last 168 hours. BNP (last 3 results) No results for input(s): PROBNP in the last 8760 hours. HbA1C: No  results for input(s): HGBA1C in the last 72 hours. CBG: No results for input(s): GLUCAP in the last 168 hours. Lipid Profile: No results for input(s): CHOL, HDL, LDLCALC, TRIG, CHOLHDL, LDLDIRECT in the last 72 hours. Thyroid Function Tests: No results for input(s): TSH,  T4TOTAL, FREET4, T3FREE, THYROIDAB in the last 72 hours. Anemia Panel: No results for input(s): VITAMINB12, FOLATE, FERRITIN, TIBC, IRON, RETICCTPCT in the last 72 hours. Sepsis Labs: Recent Labs  Lab 02/03/20 0540 02/04/20 1622  PROCALCITON 0.94 0.76    No results found for this or any previous visit (from the past 240 hour(s)).    Imaging Studies   No results found.   Medications   Scheduled Meds: . amLODipine  5 mg Oral Daily  . vitamin C  500 mg Oral Daily  . aspirin EC  81 mg Oral Daily  . dextromethorphan-guaiFENesin  1 tablet Oral BID  . enoxaparin (LOVENOX) injection  40 mg Subcutaneous Q24H  . feeding supplement (NEPRO CARB STEADY)  237 mL Oral TID BM  . folic acid  1 mg Oral Daily  . gabapentin  300 mg Oral TID  . magnesium oxide  400 mg Oral BID  . metoprolol tartrate  25 mg Oral BID  . multivitamin with minerals  1 tablet Oral Daily  . nicotine  21 mg Transdermal Daily  . potassium chloride  40 mEq Oral Once  . senna-docusate  1 tablet Oral QHS  . zinc sulfate  220 mg Oral Daily   Continuous Infusions: . dextrose 5 % and 0.9% NaCl 75 mL/hr at 02/08/20 0500       LOS: 10 days    Time spent: 40 minutes with > 50% spent in coordination of care and at bedside.    Ellenore Roscoe Sherryll Burger, DO [facility]  02/08/2020, 2:39 PM    If 7PM-7AM, please contact night-coverage. How to contact the [facility] Attending or Consulting provider 7A - 7P or covering provider during after hours 7P -7A, for this patient?    1. Check the care team in [facility] and look for a) attending/consulting [facility] provider listed and b) the [facility] team listed 2. Log into [email or web address] and use [place]'s universal password to access. If you do not have the password, please contact the hospital operator. 3. Locate the [facility] provider you are looking for under [facility] and page to a number that you can be directly reached. 4. If you still have difficulty reaching the provider, please page the  [facility] (Director on Call) for the Hospitalists listed on amion for assistance.

## 2020-02-08 NOTE — Plan of Care (Signed)
  Problem: Safety: Goal: Ability to remain free from injury will improve 02/08/2020 2313 by Garwin Brothers, RN Outcome: Progressing 02/08/2020 2312 by Garwin Brothers, RN Outcome: Progressing   Problem: Skin Integrity: Goal: Risk for impaired skin integrity will decrease 02/08/2020 2313 by Garwin Brothers, RN Outcome: Progressing 02/08/2020 2312 by Garwin Brothers, RN Outcome: Progressing

## 2020-02-09 DIAGNOSIS — K852 Alcohol induced acute pancreatitis without necrosis or infection: Secondary | ICD-10-CM | POA: Diagnosis not present

## 2020-02-09 DIAGNOSIS — U071 COVID-19: Secondary | ICD-10-CM | POA: Diagnosis not present

## 2020-02-09 DIAGNOSIS — J069 Acute upper respiratory infection, unspecified: Secondary | ICD-10-CM | POA: Diagnosis not present

## 2020-02-09 DIAGNOSIS — R945 Abnormal results of liver function studies: Secondary | ICD-10-CM | POA: Diagnosis not present

## 2020-02-09 DIAGNOSIS — Z515 Encounter for palliative care: Secondary | ICD-10-CM | POA: Diagnosis not present

## 2020-02-09 DIAGNOSIS — Z7189 Other specified counseling: Secondary | ICD-10-CM | POA: Diagnosis not present

## 2020-02-09 LAB — CBC
HCT: 38 % — ABNORMAL LOW (ref 39.0–52.0)
Hemoglobin: 12.5 g/dL — ABNORMAL LOW (ref 13.0–17.0)
MCH: 31 pg (ref 26.0–34.0)
MCHC: 32.9 g/dL (ref 30.0–36.0)
MCV: 94.3 fL (ref 80.0–100.0)
Platelets: 332 10*3/uL (ref 150–400)
RBC: 4.03 MIL/uL — ABNORMAL LOW (ref 4.22–5.81)
RDW: 13.6 % (ref 11.5–15.5)
WBC: 11 10*3/uL — ABNORMAL HIGH (ref 4.0–10.5)
nRBC: 0 % (ref 0.0–0.2)

## 2020-02-09 LAB — BASIC METABOLIC PANEL
Anion gap: 8 (ref 5–15)
BUN: <5 mg/dL — ABNORMAL LOW (ref 8–23)
CO2: 25 mmol/L (ref 22–32)
Calcium: 8.1 mg/dL — ABNORMAL LOW (ref 8.9–10.3)
Chloride: 116 mmol/L — ABNORMAL HIGH (ref 98–111)
Creatinine, Ser: 0.63 mg/dL (ref 0.61–1.24)
GFR, Estimated: >60 mL/min (ref >60)
Glucose, Bld: 121 mg/dL — ABNORMAL HIGH (ref 70–99)
Potassium: 2.8 mmol/L — ABNORMAL LOW (ref 3.5–5.1)
Sodium: 149 mmol/L — ABNORMAL HIGH (ref 135–145)

## 2020-02-09 LAB — HEPATIC FUNCTION PANEL
ALT: 43 U/L (ref 0–44)
AST: 83 U/L — ABNORMAL HIGH (ref 15–41)
Albumin: 1.8 g/dL — ABNORMAL LOW (ref 3.5–5.0)
Alkaline Phosphatase: 128 U/L — ABNORMAL HIGH (ref 38–126)
Bilirubin, Direct: 0.3 mg/dL — ABNORMAL HIGH (ref 0.0–0.2)
Indirect Bilirubin: 0.6 mg/dL (ref 0.3–0.9)
Total Bilirubin: 0.9 mg/dL (ref 0.3–1.2)
Total Protein: 5.3 g/dL — ABNORMAL LOW (ref 6.5–8.1)

## 2020-02-09 LAB — MAGNESIUM: Magnesium: 1.6 mg/dL — ABNORMAL LOW (ref 1.7–2.4)

## 2020-02-09 LAB — PHOSPHORUS: Phosphorus: 3.4 mg/dL (ref 2.5–4.6)

## 2020-02-09 MED ORDER — LORAZEPAM 2 MG/ML IJ SOLN
1.0000 mg | INTRAMUSCULAR | Status: AC | PRN
Start: 2020-02-09 — End: 2020-02-12
  Administered 2020-02-09: 10:00:00 3 mg via INTRAVENOUS
  Administered 2020-02-09 – 2020-02-10 (×3): 2 mg via INTRAVENOUS
  Filled 2020-02-09 (×3): qty 1
  Filled 2020-02-09: qty 2

## 2020-02-09 MED ORDER — THIAMINE HCL 100 MG/ML IJ SOLN
100.0000 mg | Freq: Every day | INTRAMUSCULAR | Status: DC
  Administered 2020-02-09 – 2020-02-14 (×5): 100 mg via INTRAVENOUS
  Filled 2020-02-09 (×7): qty 2

## 2020-02-09 MED ORDER — POTASSIUM CHLORIDE CRYS ER 20 MEQ PO TBCR
40.0000 meq | EXTENDED_RELEASE_TABLET | ORAL | Status: AC
  Administered 2020-02-09: 40 meq via ORAL
  Filled 2020-02-09 (×2): qty 2

## 2020-02-09 MED ORDER — LORAZEPAM 1 MG PO TABS
1.0000 mg | ORAL_TABLET | ORAL | Status: AC | PRN
Start: 2020-02-09 — End: 2020-02-12

## 2020-02-09 MED ORDER — GABAPENTIN 600 MG PO TABS
300.0000 mg | ORAL_TABLET | Freq: Every day | ORAL | Status: DC
  Administered 2020-02-10 – 2020-02-13 (×3): 300 mg via ORAL
  Filled 2020-02-09 (×4): qty 1

## 2020-02-09 NOTE — Progress Notes (Addendum)
Daily Progress Note   Patient Name: Thomas Coffey       Date: 02/09/2020 DOB: 1956-01-11  Age: 65 y.o. MRN#: [id number] Attending Physician: Delfino Lovett, MD Primary Care Physician: Patient, [name] Admit Date: 01/28/2020  Reason for Consultation/Follow-up: Establishing goals of care  Subjective: Patient remains on covid precautions. He is confused, agitated, and throwing things per staff. Oral intake is not sustainable. Spoke with patient's 2 sons. Explained father's status and challenges to providing care to someone who does not want the care staff is trying to provide. Discussed continued aggressive care vs shifting to comfort focused care.   They would like to speak with their father, and would like to speak with the attending MD.    Length of Stay: 11  Current Medications: Scheduled Meds:  . amLODipine  5 mg Oral Daily  . vitamin C  500 mg Oral Daily  . aspirin EC  81 mg Oral Daily  . dextromethorphan-guaiFENesin  1 tablet Oral BID  . enoxaparin (LOVENOX) injection  40 mg Subcutaneous Q24H  . feeding supplement (NEPRO CARB STEADY)  237 mL Oral TID BM  . folic acid  1 mg Oral Daily  . gabapentin  300 mg Oral QHS  . magnesium oxide  400 mg Oral BID  . metoprolol tartrate  25 mg Oral BID  . multivitamin with minerals  1 tablet Oral Daily  . nicotine  21 mg Transdermal Daily  . potassium chloride  40 mEq Oral Q4H  . senna-docusate  1 tablet Oral QHS  . thiamine injection  100 mg Intravenous Daily  . zinc sulfate  220 mg Oral Daily    Continuous Infusions: . dextrose 5 % and 0.9% NaCl 75 mL/hr at 02/09/20 0324    PRN Meds: acetaminophen, albuterol, bisacodyl, ipratropium, LORazepam **OR** LORazepam, ondansetron (ZOFRAN) IV    Vital Signs: BP 113/71 (BP Location: Right  Arm)   Pulse 92   Temp 98.1 F (36.7 C)   Resp 14   Ht 5\' 9"  (1.753 m)   Wt 73 kg   SpO2 94%   BMI 23.77 kg/m  SpO2: SpO2: 94 % O2 Device: O2 Device: Room Air O2 Flow Rate:    Intake/output summary:   Intake/Output Summary (Last 24 hours) at 02/09/2020 1150 Last data filed at 02/09/2020 0555 Gross per  24 hour  Intake 1864.43 ml  Output -  Net 1864.43 ml   LBM: Last BM Date: 02/07/20 Baseline Weight: Weight: 77.1 kg Most recent weight: Weight: 73 kg         Patient Active Problem List   Diagnosis Date Noted  . COVID-19 01/29/2020  . COVID-19 virus infection 01/28/2020  . Abnormal LFTs 01/28/2020  . CAD (coronary artery disease) 01/28/2020  . Atrial fibrillation, chronic (HCC) 01/28/2020  . Acute respiratory disease due to COVID-19 virus 01/28/2020  . Tobacco abuse 01/28/2020  . Acute pancreatitis 07/20/2019  . Hypomagnesemia 11/24/2018  . Protein-calorie malnutrition, severe 11/23/2018  . Pleural effusion on left   . Liver function test abnormality   . AKI (acute kidney injury) (HCC)   . Atrial fibrillation with rapid ventricular response (HCC)   . Acute hepatitis 11/17/2018  . Atrial fibrillation with RVR (HCC) 11/17/2018  . Alcohol abuse 11/17/2018  . Tobacco dependence syndrome 11/17/2018  . Empyema [facility])     Palliative Care Assessment & Plan    Recommendations/Plan:  Patient's sons would like to speak with him and patient's nurse. They would like to speak with the attending MD as well.     Code Status:    Code Status Orders  (From admission, onward)         Start     Ordered   01/28/20 1203  Full code  Continuous        01/28/20 1202        Code Status History    Date Active Date Inactive Code Status Order ID Comments User Context   07/20/2019 0230 07/20/2019 1411 Full Code [id number]  [facility] Vernetta Honey, MD ED   11/11/2018 2355 11/25/2018 1856 Full Code [id number]  Houston Siren, MD Inpatient   Advance Care Planning Activity        Prognosis:  Poor overall   Care plan was discussed with RN. Epic chat sent to MD.   Thank you for allowing the Palliative Medicine Team to assist in the care of this patient.   Total Time 15 min Prolonged Time Billed no   COVID-19 DISASTER DECLARATION:    FULL CONTACT PHYSICAL EXAMINATION WAS NOT POSSIBLE DUE TO TREATMENT OF COVID-19  AND CONSERVATION OF PERSONAL PROTECTIVE EQUIPMENT Patient assessed or the symptoms described in the history of present illness.  In the context of the Global COVID-19 pandemic, which necessitated consideration that the patient might be at risk for infection with the SARS-CoV-2 virus that causes COVID-19, Institutional protocols and algorithms that pertain to the evaluation of patients at risk for COVID-19 are in a state of rapid change based on information released by regulatory bodies including the CDC and federal and state organizations. These policies and algorithms were followed during the patient's care while in hospital.    Greater than 50%  of this time was spent counseling and coordinating care related to the above assessment and plan.  Morton Stall, NP  Please contact Palliative Medicine Team phone at [phone] for questions and concerns.

## 2020-02-09 NOTE — Progress Notes (Signed)
Patient agitated, kicking, pulling at lines and trying to throw things around the room.  Mitts applied to patient for safety by sitter and MD made aware.  No PRN medication available for agitation.  Will continue to monitor and carry out any new orders.

## 2020-02-09 NOTE — Progress Notes (Signed)
Physical Therapy Treatment Patient Details Name: Thomas Coffey MRN: [id number] DOB: Jan 19, 1955 Today's Date: 02/09/2020    History of Present Illness Thomas Coffey is a 65 y.o. male with medical history significant of alcohol abuse, tobacco abuse, alcoholic pancreatitis, CAD, myocardial infarction, atrial fibrillation not on anticoagulants, who presented to the ED on 01/28/2020 with 10 days of cough and body aches, in addition to progressive abdominal pain with nausea vomiting since the day before.     Evaluation in the ED revealed acute pancreatitis with lipase 518.  COVID-19 positive.  Liver enzymes elevated.  Chest x-ray showed acute bronchitic changes.Thomas Coffey is a 65 y.o. male with medical history significant of alcohol abuse, tobacco abuse, alcoholic pancreatitis, CAD, myocardial infarction, atrial fibrillation not on anticoagulants, who presented to the ED on 01/28/2020 with 10 days of cough and body aches, in addition to progressive abdominal pain with nausea vomiting since the day before. Evaluation in the ED revealed acute pancreatitis with lipase 518.  COVID-19 positive.  Liver enzymes elevated.  Chest x-ray showed acute bronchitic changes.    PT Comments    Pt seen for PT treatment. Pt remains very confused and lethargic during session with minimal initiation for supine>sit and sit>stand. Pt requires max assist overall for mobility & attempts sit<>stand x 3 trials initially with 1UE HHA then with RW with max cuing for hand placement on AD. Pt is able to initiate transfer but remains extremely flexed forward & BLE pushing back on bed. Pt unable to shift pelvis anteriorly and come to upright posture even after PT attempts to provide verbal/tactile cuing & PT unable to manually facilitate. Will continue to follow pt acutely to progress transfers & gait as able. Pt will require STR upon d/c to maximize independence with functional mobility & for cognitive remediation prior to return  home.    Follow Up Recommendations  SNF     Equipment Recommendations  None recommended by PT    Recommendations for Other Services       Precautions / Restrictions Precautions Precautions: Fall Restrictions Weight Bearing Restrictions: No    Mobility  Bed Mobility Overal bed mobility: Needs Assistance Bed Mobility: Supine to Sit;Sit to Supine     Supine to sit: Max assist;HOB elevated Sit to supine: Max assist;HOB elevated      Transfers Overall transfer level: Needs assistance Equipment used: Rolling Schumacher (2 wheeled);1 person hand held assist Transfers: Sit to/from Stand Sit to Stand: Max assist         General transfer comment: max cuing for hand placement on RW  Ambulation/Gait                 Stairs             Wheelchair Mobility    Modified Rankin (Stroke Patients Only)       Balance Overall balance assessment: Needs assistance Sitting-balance support: Feet supported;Bilateral upper extremity supported Sitting balance-Leahy Scale: Fair Sitting balance - Comments: CGA sitting EOB   Standing balance support: Bilateral upper extremity supported Standing balance-Leahy Scale: Zero Standing balance comment: requires UE support, max assist, strong posterior lean on to bed with BLE                            Cognition Arousal/Alertness: Lethargic Behavior During Therapy: Flat affect Overall Cognitive Status: Impaired/Different from baseline Area of Impairment: Orientation;Memory;Attention;Safety/judgement;Following commands;Problem solving;Awareness  Orientation Level: Disoriented to;Place;Time;Situation   Memory: Decreased short-term memory;Decreased recall of precautions Following Commands: Follows one step commands inconsistently;Follows one step commands with increased time Safety/Judgement: Decreased awareness of deficits;Decreased awareness of safety Awareness:  Emergent;Anticipatory;Intellectual Problem Solving: Decreased initiation;Requires tactile cues;Requires verbal cues;Slow processing        Exercises      General Comments        Pertinent Vitals/Pain Pain Assessment: Faces Faces Pain Scale: No hurt    Home Living                      Prior Function            PT Goals (current goals can now be found in the care plan section) Acute Rehab PT Goals Patient Stated Goal: "get out of this place" PT Goal Formulation: With patient Time For Goal Achievement: 02/16/20 Potential to Achieve Goals: Fair Progress towards PT goals: PT to reassess next treatment    Frequency    Min 2X/week      PT Plan Current plan remains appropriate    Co-evaluation              AM-PAC PT "6 Clicks" Mobility   Outcome Measure  Help needed turning from your back to your side while in a flat bed without using bedrails?: A Lot Help needed moving from lying on your back to sitting on the side of a flat bed without using bedrails?: A Lot Help needed moving to and from a bed to a chair (including a wheelchair)?: Total Help needed standing up from a chair using your arms (e.g., wheelchair or bedside chair)?: Total Help needed to walk in hospital room?: Total Help needed climbing 3-5 steps with a railing? : Total 6 Click Score: 8    End of Session Equipment Utilized During Treatment: Gait belt Activity Tolerance: Patient limited by lethargy Patient left: in bed;with nursing/sitter in room   PT Visit Diagnosis: Unsteadiness on feet (R26.81);Difficulty in walking, not elsewhere classified (R26.2);Muscle weakness (generalized) (M62.81)     Time: 4970-2637 PT Time Calculation (min) (ACUTE ONLY): 9 min  Charges:  $Therapeutic Activity: 8-22 mins                    Thomas Coffey, PT, DPT 02/09/20, 11:45 AM    Sandi Mariscal 02/09/2020, 11:43 AM

## 2020-02-09 NOTE — Progress Notes (Signed)
  Speech Language Pathology Treatment: Dysphagia  Patient Details Name: Thomas Coffey MRN: [id number] DOB: 11-11-1955 Today's Date: 02/09/2020 Time: 1020-1100 SLP Time Calculation (min) (ACUTE ONLY): 40 min  Assessment / Plan / Recommendation Clinical Impression  Pt seen for ongoing assessment of swallowing. Sitter still present in room d/t agitation; confusion; behaviors. He has not taken full meals per NSG report; more bites/sips. Coughing has been noted post, during oral intake per NSG. He appears less engaged today; min verbally responsive w/ mumbled speech; Mod+ cues for follow through. Pt is on RA; wbc min elevated. Poor insight and awareness of self in bed.   Pt explained general aspiration precautions and need for following them especially sitting upright for all oral intake; small bites/sips. Pt given Max A w/ positioning d/t poor insight and overall weakness then given trials of nectar liquids, purees. No immediate, overt clinical s/s of aspiration were noted w/ any consistency; respiratory status remained calm and unlabored. But then delayed throat clearing and Coughing followed trials fairly consistently -- both the nectar liquids and the purees. Pt exhibited a Congested, raspy Cough. Some expectoration of phelgm w/ suctioning via yaunkeur. After Coughing, vocal quality was clear but low volume; mumbled speech. Suspect pt is at risk to present w/ delayed pharyngeal swallow initiation d/t overall Cognitive/mental status decline BUT also suspect pt could have Esophageal dysmotility d/t ETOH abuse and an irritated Esophagus(the increased phlegm). Pt was fed by TSP to monitor/control bolus size for better oropharyngeal control, however, the coughing was consistent. Oral phase deficits noted c/b lengthier oral phase time for bolus management and A-P transfer for swallowing; oral clearing was achieved w/ all consistencies given Time.  Due to the increased Coughing w/ modified foods/liquids  consistencies, and High risk for aspiration thus Pulmonary decline, recommend NPO status. Recommend frequent oral care for hygiene and stimulation of swallowing w/ general aspiration precautions; meds via alternative means. MD consulted re: pt's status and stated that Palliative Care is discussing w/ family options including Hospice transition d/t his decline in status overall. ST services will continue to f/u w/ pt's status this week and will look to MD for guidance for further po assessment. Discussed w/ Dietician, MD/NSG.      HPI HPI: Pt is a 65 y.o. male with medical history significant of alcohol abuse, tobacco abuse, alcoholic pancreatitis, CAD, myocardial infarction, atrial fibrillation not on anticoagulants, who presents with cough, abdominal pain.  COVID-19 positive.  Liver enzymes elevated currently. Chest x-ray showed acute bronchitic changes.  On CIWA protocol with as needed Ativan -- reduce Librium to 25 mg TID to reduce need for Ativan per MD notes.  Pt requires a Sitter d/t agitation and behavior.      SLP Plan  Goals updated;Continue with current plan of care;Consult other service (comment)       Recommendations  Diet recommendations: NPO Medication Administration: Via alternative means      MD: Please consider changing trach tube to :  (Palliative Care)         Oral Care Recommendations: Oral care QID;Staff/trained caregiver to provide oral care Follow up Recommendations:  (TBD) SLP Visit Diagnosis: Dysphagia, oropharyngeal phase (R13.12) (mental status decline) Plan: Goals updated;Continue with current plan of care;Consult other service (comment)       GO                 Jerilynn Som, MS, CCC-SLP Speech Language Pathologist Rehab Services [phone] [facility] 02/09/2020, 4:44 PM

## 2020-02-09 NOTE — Progress Notes (Signed)
PROGRESS NOTE    Thomas Coffey   [id number]:[id number]  DOB: Apr 12, 1955  PCP: Patient, [name]    DOA: 01/28/2020 LOS: 11   Brief Narrative   Thomas Coffey is a 65 y.o. male with medical history significant of alcohol abuse, tobacco abuse, alcoholic pancreatitis, CAD, myocardial infarction, atrial fibrillation not on anticoagulants, who presented to the ED on 01/28/2020 with 10 days of cough and body aches, in addition to progressive abdominal pain with nausea vomiting since the day before.  Evaluation in the ED revealed acute pancreatitis with lipase 518.  COVID-19 positive.  Liver enzymes elevated.  Chest x-ray showed acute bronchitic changes.  1/19: Made n.p.o. by speech therapy due to significant decline in the cognitive status and unable to stay awake. 1/20: Tapering his Librium 50->20 mg 3 times daily to see if it helps his mental status 1/21: He is more awake today and speech therapy was able to start him on dysphagia 1 diet.  He does cough and likely aspirating silently - his long-term nutrition remains a challenge and concerning for worsening severe malnutrition. taper his Librium from 20->10 mg 3 times daily 1/22: Per nursing report he has been more awake and was able to eat yesterday.  Did require some Ativan this morning for agitation.  We will continue tapering Librium 10->5 mg TID as needed 1/23 -discontinuing Librium and CIWA protocol. trial off any sedation.  Waiting for SNF 1/24: got very agitated per nursing. So CIWA had to be placed back. Nursing also concerned for constant aspiration when tries to feed.   Assessment & Plan   Principal Problem:   Acute respiratory disease due to COVID-19 virus Active Problems:   Alcohol abuse   Hypomagnesemia   Acute pancreatitis   Abnormal LFTs   CAD (coronary artery disease)   Atrial fibrillation, chronic (HCC)   Tobacco abuse   COVID-19   Acute metabolic encephalopathy -from alcohol withdrawal  CT head and MRI brain  negative for any acute findings  Alcohol abuse / Uncomplicated Alcohol Withdrawal - Son reports pt very heavy drinker, all day every day, both beer and liquor for entire adult life. He was not aware of prior withdrawal or if hx of seizure or DT's --He is tapered off Librium.  Stopped on 1/23 --D/Ced CIWA on 1/23 but started agitation (kicking, pulling at lines and trying to throw things around the room per nursing) today and had to put him back on CIWA -- continue gabapentin  Aspiration pneumonia- Due to rising Pro-Cal 0.11>> 0.94, in the setting of reports by nursing staff patient having difficulty swallowing. --completed 5 days course of Unasyn on 1/23 -- Aspiration precautions - ST made him NPO again due to ongoing aspirations  Inadequate p.o. intake - seems acute on chronic issue based his malnutrition  Hypoglycemia -due to poor p.o. intake.  On D5-NS   Hypokalemia  / Hypomagnesemia -present on admission.  Repleted  COVID-19 infection -presented with 10-day history of body aches and cough, tested positive in the ED.  Has not been hypoxic.  Chest x-ray showed bronchitic changes. --Completed remdesivir, Steroids started on admission were stopped given no hypoxia -- Scheduled Mucinex, as needed albuterol -- Vitamin C, zinc -- Completed 5 day course doxycycline for secondary bronchitis  Acute pancreatitis -seems resolved.  Present on admission, due to alcohol abuse.   -- Diet as tolerated -- As needed Zofran  Sinus tachycardia - due to Etoh withdrawal most likely.  Now resolved on metoprolol  Hypertension -  probably related to EtOH withdrawal. Lopressor given A-fib history and tachycardia.   BP improved.  Monitor.  Tobacco abuse -nicotine patch.  Smoking cessation recommended.  Transaminitis -likely due to alcohol abuse.  Monitor CMP closely since on remdesivir. RUQ ultrasound showed likely hepatic steatosis, gall stones.  CAD -chronic, stable without active chest pain or  acute ischemic changes on EKG.   -- Continue aspirin  A. fib, chronic - Has been normal sinus on tele, tachycardia related to alcohol withdrawal -- Not on anticoagulation or rate control meds -- Monitor on telemetry --Continue Lopressor for rate and BP  He's constantly aspirating. Made NPO again on 1/24  DVT prophylaxis: enoxaparin (LOVENOX) injection 40 mg Start: 01/28/20 2200   Diet:  Diet Orders (From admission, onward)    Start     Ordered   02/09/20 1208  Diet NPO time specified  Diet effective now        02/09/20 1208            Code Status: Full Code    Subjective 02/09/20   Seems agitated, confused. Sitter at beside Disposition Plan & Communication   Status is: Inpatient  Remains inpatient appropriate because:IV treatments appropriate due to intensity of illness or inability to take PO. Very poor prognosis. Recommend Hospice.   Dispo: The patient is from: Home              Anticipated d/c is to: SNF/Hospice Home              Anticipated d/c date is: 2-3 days              Patient currently is not medically stable to d/c.   Family Communication:  I did conference call with both son [name] Antonio on 1/24. I've explained his overall poor prognosis and to consider comfort care/Hospice. They will d/w their father and meet palliative care tomorrow to make final decision.    Consults, Procedures, Significant Events   Consultants:   Palliative care  Procedures:   None  Antimicrobials:  Anti-infectives (From admission, onward)   Start     Dose/Rate Route Frequency Ordered Stop   02/04/20 2200  Ampicillin-Sulbactam (UNASYN) 3 g in sodium chloride 0.9 % 100 mL IVPB        3 g 200 mL/hr over 30 Minutes Intravenous Every 6 hours 02/04/20 1320 02/07/20 2359   02/03/20 1015  amoxicillin-clavulanate (AUGMENTIN) 875-125 MG per tablet 1 tablet  Status:  Discontinued        1 tablet Oral Every 12 hours 02/03/20 0929 02/04/20 1320   01/29/20 1000  azithromycin  (ZITHROMAX) tablet 250 mg  Status:  Discontinued       "Followed by" Linked Group Details   250 mg Oral Daily 01/28/20 1010 01/28/20 1025   01/29/20 1000  remdesivir 100 mg in sodium chloride 0.9 % 100 mL IVPB       "Followed by" Linked Group Details   100 mg 200 mL/hr over 30 Minutes Intravenous Daily 01/28/20 1024 02/02/20 0959   01/28/20 1200  remdesivir 200 mg in sodium chloride 0.9% 250 mL IVPB       "Followed by" Linked Group Details   200 mg 580 mL/hr over 30 Minutes Intravenous Once 01/28/20 1024 01/28/20 1318   01/28/20 1030  doxycycline (VIBRA-TABS) tablet 100 mg  Status:  Discontinued        100 mg Oral Every 12 hours 01/28/20 1025 02/01/20 1425   01/28/20 1015  azithromycin (ZITHROMAX) tablet 500  mg  Status:  Discontinued       "Followed by" Linked Group Details   500 mg Oral Daily 01/28/20 1010 01/28/20 1025        Objective   Vitals:   02/09/20 0013 02/09/20 0354 02/09/20 0800 02/09/20 1221  BP: 119/83 118/71 113/71 127/88  Pulse: 88 96 92 (!) 102  Resp: 16 16 14 17   Temp: 98 F (36.7 C) 99.5 F (37.5 C) 98.1 F (36.7 C) 98.8 F (37.1 C)  TempSrc: Oral Oral    SpO2: 97% 95% 94% (!) 82%  Weight:      Height:        Intake/Output Summary (Last 24 hours) at 02/09/2020 1819 Last data filed at 02/09/2020 0555 Gross per 24 hour  Intake 1864.43 ml  Output -  Net 1864.43 ml   Filed Weights   01/28/20 0532 02/06/20 0500  Weight: 77.1 kg 73 kg    Physical Exam: Sitter at bedside General exam: agitated Respiratory system: CTAB anteriorly, on room air, normal respiratory effort, no wheezing or rhonchi Cardiovascular system: RRR, no pedal edema   Gastrointestinal system: soft, non-tender, +bowel soudns Central nervous system: no gross focal neurologic deficits, agitated Extremities: moves all, normal tone Labs   Data Reviewed: I have personally reviewed following labs and imaging studies  CBC: Recent Labs  Lab 02/04/20 1434 02/05/20 0631 02/06/20 0624  02/07/20 0625 02/08/20 1017 02/09/20 0810  WBC 10.3 10.8* 11.4* 11.8* 11.9* 11.0*  NEUTROABS 7.6 9.1* 9.7* 9.6* 10.0*  --   HGB 15.6 15.1 13.0 15.1 14.3 12.5*  HCT 47.2 44.8 39.1 44.9 41.7 38.0*  MCV 96.1 93.9 94.7 93.2 92.5 94.3  PLT 240 276 290 334 304 332   Basic Metabolic Panel: Recent Labs  Lab 02/03/20 0540 02/04/20 1622 02/05/20 0631 02/06/20 0624 02/07/20 0625 02/08/20 1017 02/08/20 1243 02/09/20 0810  NA 140 142 144 147* 147* 145  --  149*  K 3.8 4.0 3.7 3.2* 3.9 3.2*  --  2.8*  CL 104 108 107 112* 111 111  --  116*  CO2 19* 24 26 25 24 23   --  25  GLUCOSE 68* 109* 125* 110* 90 135*  --  121*  BUN 9 6* <5* <5* <5* <5*  --  <5*  CREATININE 0.62 0.58* 0.84 0.69 0.49* 0.59*  --  0.63  CALCIUM 9.3 9.2 9.4 8.9 9.2 8.5*  --  8.1*  MG 1.9 1.9  --   --   --   --  1.7 1.6*  PHOS  --  3.5  --   --   --   --   --  3.4   GFR: Estimated Creatinine Clearance: 93.3 mL/min (by C-G formula based on SCr of 0.63 mg/dL). Liver Function Tests: Recent Labs  Lab 02/05/20 0631 02/06/20 0624 02/07/20 0625 02/08/20 1017 02/09/20 0810  AST 105* 94* 119* 102* 83*  ALT 54* 48* 58* 53* 43  ALKPHOS 156* 128* 155* 138* 128*  BILITOT 2.3* 1.6* 1.6* 1.2 0.9  PROT 6.9 6.1* 7.2 6.1* 5.3*  ALBUMIN 2.3* 2.1* 2.5* 2.0* 1.8*   Recent Labs  Lab 02/03/20 0540  LIPASE 49   Recent Labs  Lab 02/03/20 1713  AMMONIA 52*   Coagulation Profile: No results for input(s): INR, PROTIME in the last 168 hours. Cardiac Enzymes: No results for input(s): CKTOTAL, CKMB, CKMBINDEX, TROPONINI in the last 168 hours. BNP (last 3 results) No results for input(s): PROBNP in the last 8760 hours. HbA1C: No results for  input(s): HGBA1C in the last 72 hours. CBG: No results for input(s): GLUCAP in the last 168 hours. Lipid Profile: No results for input(s): CHOL, HDL, LDLCALC, TRIG, CHOLHDL, LDLDIRECT in the last 72 hours. Thyroid Function Tests: No results for input(s): TSH, T4TOTAL, FREET4, T3FREE,  THYROIDAB in the last 72 hours. Anemia Panel: No results for input(s): VITAMINB12, FOLATE, FERRITIN, TIBC, IRON, RETICCTPCT in the last 72 hours. Sepsis Labs: Recent Labs  Lab 02/03/20 0540 02/04/20 1622  PROCALCITON 0.94 0.76    No results found for this or any previous visit (from the past 240 hour(s)).    Imaging Studies   No results found.   Medications   Scheduled Meds: . amLODipine  5 mg Oral Daily  . vitamin C  500 mg Oral Daily  . aspirin EC  81 mg Oral Daily  . dextromethorphan-guaiFENesin  1 tablet Oral BID  . enoxaparin (LOVENOX) injection  40 mg Subcutaneous Q24H  . folic acid  1 mg Oral Daily  . gabapentin  300 mg Oral QHS  . magnesium oxide  400 mg Oral BID  . metoprolol tartrate  25 mg Oral BID  . multivitamin with minerals  1 tablet Oral Daily  . nicotine  21 mg Transdermal Daily  . potassium chloride  40 mEq Oral Q4H  . senna-docusate  1 tablet Oral QHS  . thiamine injection  100 mg Intravenous Daily  . zinc sulfate  220 mg Oral Daily   Continuous Infusions: . dextrose 5 % and 0.9% NaCl 75 mL/hr at 02/09/20 1359       LOS: 11 days    Time spent: 40 minutes with > 50% spent in coordination of care and at bedside.    Vipul Sherryll Burger, DO [facility]  02/09/2020, 6:19 PM    If 7PM-7AM, please contact night-coverage. How to contact the [facility] Attending or Consulting provider 7A - 7P or covering provider during after hours 7P -7A, for this patient?    1. Check the care team in [facility] and look for a) attending/consulting [facility] provider listed and b) the [facility] team listed 2. Log into [email or web address] and use [facility] universal password to access. If you do not have the password, please contact the hospital operator. 3. Locate the [facility] provider you are looking for under [facility] and page to a number that you can be directly reached. 4. If you still have difficulty reaching the provider, please page the [facility] (Director on Call) for the  Hospitalists listed on amion for assistance.

## 2020-02-09 NOTE — Progress Notes (Signed)
Calorie Count Note  48 hour calorie count ordered.  Diet: dysphagia 1 with nectar thick liquids Supplements: Nepro po TID (425 kcal and 19 grams of protein), Magic Cup po TID (290 kcal and 9 grams of protein)  No meal receipts were collected over the weekend for the calorie count. Discussed with RN who reports patient ate 10% of breakfast yesterday, 0% of lunch and dinner yesterday, and 0% supplements. Patient now made NPO today by SLP. Sent secure chat message to MD regarding recommendation for placement of NGT for initiation of tube feeds if this aligns with goals of care. Palliative medicine is following patient/family and discussing goals of care. Will monitor outcome of discussions.  Breakfast 1/23: 76 kcal, 4 grams of protein (10% of meal) Lunch 1/23: N/A (0%) Dinner 1/23: N/A (0%) Supplements 1/23: N/A (0%)  Total intake Day 1: 76 kcal (4% of minimum estimated needs)  4 grams of protein (4% of minimum estimated needs)  Estimated Nutritional Needs:  Kcal:  1800-2000 Protein:  90-100 grams Fluid:  1.8-2 L/day  Nutrition Dx: Severe Malnutrition related to social / environmental circumstances (EtOH abuse, suspected inadequate oral intake) as evidenced by severe fat depletion,severe muscle depletion.  Goal: Patient will meet greater than or equal to 90% of their needs  Intervention: -Will discontinue calorie count. Patient now NPO. -Recommend placement of small-bore NGT ([place]hoff) for initiation of tube feeds if this aligns with goals of care. Palliative medicine discussing goals of care with family. If plan is for tube feeds recommend:  -Initiate Osmolite 1.5 Cal at 20 mL/hr and advance by 15 mL/hr every 12 hours to goal rate of 50 mL/hr per tube  -Provide PROSource TF 45 mL BID per tube  -Goal regimen provides 1880 kcal, 97 grams of protein, 912 mL H2O daily  -Recommend free water flush of 150 mL Q4hrs per tube, which would provide 1812 mL H2O daily including water in TF  regimen -Monitor magnesium, potassium, and phosphorus daily for at least 3 days, MD to replete as needed, as pt is at risk for refeeding syndrome given severe malnutrition.  Felix Pacini, MS, RD, LDN Pager number available on Amion

## 2020-02-09 NOTE — Plan of Care (Signed)

## 2020-02-10 DIAGNOSIS — F1010 Alcohol abuse, uncomplicated: Secondary | ICD-10-CM | POA: Diagnosis not present

## 2020-02-10 DIAGNOSIS — U071 COVID-19: Secondary | ICD-10-CM | POA: Diagnosis not present

## 2020-02-10 DIAGNOSIS — Z515 Encounter for palliative care: Secondary | ICD-10-CM | POA: Diagnosis not present

## 2020-02-10 DIAGNOSIS — K852 Alcohol induced acute pancreatitis without necrosis or infection: Secondary | ICD-10-CM | POA: Diagnosis not present

## 2020-02-10 DIAGNOSIS — R945 Abnormal results of liver function studies: Secondary | ICD-10-CM | POA: Diagnosis not present

## 2020-02-10 DIAGNOSIS — Z7189 Other specified counseling: Secondary | ICD-10-CM | POA: Diagnosis not present

## 2020-02-10 DIAGNOSIS — J069 Acute upper respiratory infection, unspecified: Secondary | ICD-10-CM | POA: Diagnosis not present

## 2020-02-10 NOTE — Progress Notes (Signed)
PROGRESS NOTE    Thomas Coffey   FFM:[id number]  DOB: [birth year]  PCP: Patient, [name]    DOA: 01/28/2020 LOS: 12   Brief Narrative   Develle D Pfalzgraf is a 65 y.o. male with medical history significant of alcohol abuse, tobacco abuse, alcoholic pancreatitis, CAD, myocardial infarction, atrial fibrillation not on anticoagulants, who presented to the ED on 01/28/2020 with 10 days of cough and body aches, in addition to progressive abdominal pain with nausea vomiting since the day before.  Evaluation in the ED revealed acute pancreatitis with lipase 518.  COVID-19 positive.  Liver enzymes elevated.  Chest x-ray showed acute bronchitic changes.  1/19: Made n.p.o. by speech therapy due to significant decline in the cognitive status and unable to stay awake. 1/20: Tapering his Librium 50->20 mg 3 times daily to see if it helps his mental status 1/21: He is more awake today and speech therapy was able to start him on dysphagia 1 diet.  He does cough and likely aspirating silently - his long-term nutrition remains a challenge and concerning for worsening severe malnutrition. taper his Librium from 20->10 mg 3 times daily 1/22: Per nursing report he has been more awake and was able to eat yesterday.  Did require some Ativan this morning for agitation.  We will continue tapering Librium 10->5 mg TID as needed 1/23 -discontinuing Librium and CIWA protocol. trial off any sedation.  Waiting for SNF 1/24: got very agitated per nursing. So CIWA had to be placed back. Nursing also concerned for constant aspiration when tries to feed.   Assessment & Plan   Principal Problem:   Acute respiratory disease due to COVID-19 virus Active Problems:   Alcohol abuse   Hypomagnesemia   Acute pancreatitis   Abnormal LFTs   CAD (coronary artery disease)   Atrial fibrillation, chronic (HCC)   Tobacco abuse   COVID-19   Acute metabolic encephalopathy -from alcohol withdrawal  CT head and MRI brain  negative for any acute findings  Alcohol abuse / Uncomplicated Alcohol Withdrawal - Son reports pt very heavy drinker, all day every day, both beer and liquor for entire adult life. He was not aware of prior withdrawal or if hx of seizure or DT's --He is tapered off Librium.  Stopped on 1/23 --D/Ced CIWA on 1/23 but started agitation (kicking, pulling at lines and trying to throw things around the room per nursing) so CIWA placed back on 1/24 -- continue gabapentin  Aspiration pneumonia- Due to rising Pro-Cal 0.11>> 0.94, in the setting of reports by nursing staff patient having difficulty swallowing. --completed 5 days course of Unasyn on 1/23 -- Aspiration precautions - ST made him NPO again due to ongoing aspirations  Inadequate p.o. intake - seems acute on chronic issue based his malnutrition  Hypoglycemia -due to poor p.o. intake.  On D5-NS   Hypokalemia  / Hypomagnesemia -present on admission.  Repleted  COVID-19 infection -presented with 10-day history of body aches and cough, tested positive in the ED.  Has not been hypoxic.  Chest x-ray showed bronchitic changes. --Completed remdesivir, Steroids started on admission were stopped given no hypoxia -- Scheduled Mucinex, as needed albuterol -- Vitamin C, zinc -- Completed 5 day course doxycycline for secondary bronchitis  Acute pancreatitis -seems resolved.  Present on admission, due to alcohol abuse.   -- Diet as tolerated -- As needed Zofran  Sinus tachycardia - due to Etoh withdrawal most likely.  Now resolved on metoprolol  Hypertension - probably related  to EtOH withdrawal. Lopressor given A-fib history and tachycardia.   BP improved.  Monitor.  Tobacco abuse -nicotine patch.  Smoking cessation recommended.  Transaminitis -likely due to alcohol abuse.  Monitor CMP closely since on remdesivir. RUQ ultrasound showed likely hepatic steatosis, gall stones.  CAD -chronic, stable without active chest pain or acute ischemic  changes on EKG.    A. fib, chronic - Has been normal sinus on tele, tachycardia related to alcohol withdrawal -- Not on anticoagulation or rate control meds -- Monitor on telemetry --Continue Lopressor for rate and BP  He's constantly aspirating. Made NPO again on 1/24  DVT prophylaxis: enoxaparin (LOVENOX) injection 40 mg Start: 01/28/20 2200   Diet:  Diet Orders (From admission, onward)    Start     Ordered   02/09/20 1208  Diet NPO time specified  Diet effective now        02/09/20 1208            Code Status: Full Code    Subjective 02/10/20   Obtunded.  Sitter at beside Disposition Plan & Communication   Status is: Inpatient  Remains inpatient appropriate because:IV treatments appropriate due to intensity of illness or inability to take PO. Very poor prognosis. Recommend Hospice.   Dispo: The patient is from: Home              Anticipated d/c is to: SNF/Hospice Home              Anticipated d/c date is: 2-3 days              Patient currently is not medically stable to d/c.   Family Communication:  I did conference call with both son [name] & Antonio on 1/25. I've explained his overall poor prognosis and to consider comfort care/Hospice. They will d/w their father and meet palliative care tomorrow to make final decision.    Consults, Procedures, Significant Events   Consultants:   Palliative care  Procedures:   None  Antimicrobials:  Anti-infectives (From admission, onward)   Start     Dose/Rate Route Frequency Ordered Stop   02/04/20 2200  Ampicillin-Sulbactam (UNASYN) 3 g in sodium chloride 0.9 % 100 mL IVPB        3 g 200 mL/hr over 30 Minutes Intravenous Every 6 hours 02/04/20 1320 02/07/20 2359   02/03/20 1015  amoxicillin-clavulanate (AUGMENTIN) 875-125 MG per tablet 1 tablet  Status:  Discontinued        1 tablet Oral Every 12 hours 02/03/20 0929 02/04/20 1320   01/29/20 1000  azithromycin (ZITHROMAX) tablet 250 mg  Status:  Discontinued        "Followed by" Linked Group Details   250 mg Oral Daily 01/28/20 1010 01/28/20 1025   01/29/20 1000  remdesivir 100 mg in sodium chloride 0.9 % 100 mL IVPB       "Followed by" Linked Group Details   100 mg 200 mL/hr over 30 Minutes Intravenous Daily 01/28/20 1024 02/02/20 0959   01/28/20 1200  remdesivir 200 mg in sodium chloride 0.9% 250 mL IVPB       "Followed by" Linked Group Details   200 mg 580 mL/hr over 30 Minutes Intravenous Once 01/28/20 1024 01/28/20 1318   01/28/20 1030  doxycycline (VIBRA-TABS) tablet 100 mg  Status:  Discontinued        100 mg Oral Every 12 hours 01/28/20 1025 02/01/20 1425   01/28/20 1015  azithromycin (ZITHROMAX) tablet 500 mg  Status:  Discontinued       "  Followed by" Linked Group Details   500 mg Oral Daily 01/28/20 1010 01/28/20 1025        Objective   Vitals:   02/09/20 2357 02/10/20 0427 02/10/20 0717 02/10/20 1112  BP: (!) 144/86 133/86 (!) 138/94 (!) 144/89  Pulse: 89 93 91 78  Resp: 17 20 19 20   Temp: 98.7 F (37.1 C) 98 F (36.7 C) 97.8 F (36.6 C) 98 F (36.7 C)  TempSrc:      SpO2: 98% 99% 99% 95%  Weight:      Height:        Intake/Output Summary (Last 24 hours) at 02/10/2020 1238 Last data filed at 02/10/2020 02/12/2020 Gross per 24 hour  Intake 861.1 ml  Output -  Net 861.1 ml   Filed Weights   01/28/20 0532 02/06/20 0500  Weight: 77.1 kg 73 kg    Physical Exam: Sitter at bedside General exam: Sleepy Respiratory system: CTAB anteriorly, on room air, normal respiratory effort, no wheezing or rhonchi Cardiovascular system: RRR, no pedal edema   Gastrointestinal system: soft, non-tender, +bowel soudns Central nervous system: no gross focal neurologic deficits, agitated Extremities: moves all, normal tone Labs   Data Reviewed: I have personally reviewed following labs and imaging studies  CBC: Recent Labs  Lab 02/04/20 1434 02/05/20 0631 02/06/20 0624 02/07/20 0625 02/08/20 1017 02/09/20 0810  WBC 10.3 10.8* 11.4*  11.8* 11.9* 11.0*  NEUTROABS 7.6 9.1* 9.7* 9.6* 10.0*  --   HGB 15.6 15.1 13.0 15.1 14.3 12.5*  HCT 47.2 44.8 39.1 44.9 41.7 38.0*  MCV 96.1 93.9 94.7 93.2 92.5 94.3  PLT 240 276 290 334 304 332   Basic Metabolic Panel: Recent Labs  Lab 02/04/20 1622 02/05/20 0631 02/06/20 0624 02/07/20 0625 02/08/20 1017 02/08/20 1243 02/09/20 0810  NA 142 144 147* 147* 145  --  149*  K 4.0 3.7 3.2* 3.9 3.2*  --  2.8*  CL 108 107 112* 111 111  --  116*  CO2 24 26 25 24 23   --  25  GLUCOSE 109* 125* 110* 90 135*  --  121*  BUN 6* <5* <5* <5* <5*  --  <5*  CREATININE 0.58* 0.84 0.69 0.49* 0.59*  --  0.63  CALCIUM 9.2 9.4 8.9 9.2 8.5*  --  8.1*  MG 1.9  --   --   --   --  1.7 1.6*  PHOS 3.5  --   --   --   --   --  3.4   GFR: Estimated Creatinine Clearance: 93.3 mL/min (by C-G formula based on SCr of 0.63 mg/dL). Liver Function Tests: Recent Labs  Lab 02/05/20 0631 02/06/20 0624 02/07/20 0625 02/08/20 1017 02/09/20 0810  AST 105* 94* 119* 102* 83*  ALT 54* 48* 58* 53* 43  ALKPHOS 156* 128* 155* 138* 128*  BILITOT 2.3* 1.6* 1.6* 1.2 0.9  PROT 6.9 6.1* 7.2 6.1* 5.3*  ALBUMIN 2.3* 2.1* 2.5* 2.0* 1.8*   No results for input(s): LIPASE, AMYLASE in the last 168 hours. Recent Labs  Lab 02/03/20 1713  AMMONIA 52*   Coagulation Profile: No results for input(s): INR, PROTIME in the last 168 hours. Cardiac Enzymes: No results for input(s): CKTOTAL, CKMB, CKMBINDEX, TROPONINI in the last 168 hours. BNP (last 3 results) No results for input(s): PROBNP in the last 8760 hours. HbA1C: No results for input(s): HGBA1C in the last 72 hours. CBG: No results for input(s): GLUCAP in the last 168 hours. Lipid Profile: No results for input(s):  CHOL, HDL, LDLCALC, TRIG, CHOLHDL, LDLDIRECT in the last 72 hours. Thyroid Function Tests: No results for input(s): TSH, T4TOTAL, FREET4, T3FREE, THYROIDAB in the last 72 hours. Anemia Panel: No results for input(s): VITAMINB12, FOLATE, FERRITIN, TIBC,  IRON, RETICCTPCT in the last 72 hours. Sepsis Labs: Recent Labs  Lab 02/04/20 1622  PROCALCITON 0.76    No results found for this or any previous visit (from the past 240 hour(s)).    Imaging Studies   No results found.   Medications   Scheduled Meds: . amLODipine  5 mg Oral Daily  . vitamin C  500 mg Oral Daily  . aspirin EC  81 mg Oral Daily  . dextromethorphan-guaiFENesin  1 tablet Oral BID  . enoxaparin (LOVENOX) injection  40 mg Subcutaneous Q24H  . folic acid  1 mg Oral Daily  . gabapentin  300 mg Oral QHS  . magnesium oxide  400 mg Oral BID  . metoprolol tartrate  25 mg Oral BID  . multivitamin with minerals  1 tablet Oral Daily  . nicotine  21 mg Transdermal Daily  . senna-docusate  1 tablet Oral QHS  . thiamine injection  100 mg Intravenous Daily  . zinc sulfate  220 mg Oral Daily   Continuous Infusions: . dextrose 5 % and 0.9% NaCl 75 mL/hr at 02/10/20 0213       LOS: 12 days    Time spent: 40 minutes with > 50% spent in coordination of care and at bedside.    Joseph Johns Sherryll Burger, DO [facility]  02/10/2020, 12:38 PM    If 7PM-7AM, please contact night-coverage. How to contact the [facility] Attending or Consulting provider 7A - 7P or covering provider during after hours 7P -7A, for this patient?    1. Check the care team in [facility] and look for a) attending/consulting TR[facility] provider listed and b) the [facility] team listed 2. Log into [email or web address] and use [facility]'s universal password to access. If you do not have the password, please contact the hospital operator. 3. Locate the [facility] provider you are looking for under [facility] and page to a number that you can be directly reached. 4. If you still have difficulty reaching the provider, please page the [facility] (Director on Call) for the Hospitalists listed on amion for assistance.

## 2020-02-10 NOTE — Progress Notes (Addendum)
Daily Progress Note   Patient Name: Thomas Coffey       Date: 02/10/2020 DOB: Mar 11, 1955  Age: 65 y.o. MRN#: [id number] Attending Physician: Delfino Lovett, MD Primary Care Physician: Patient, [name] Admit Date: 01/28/2020  Reason for Consultation/Follow-up: Establishing goals of care  Subjective: Patient is on covid isolation. He has a Comptroller at bedside. Spoke with his sons. Discussed his agitation and concerns for PO intake. They are coming today to see him. Will reach out to them tomorrow for further.    Length of Stay: 12  Current Medications: Scheduled Meds:  . amLODipine  5 mg Oral Daily  . vitamin C  500 mg Oral Daily  . aspirin EC  81 mg Oral Daily  . dextromethorphan-guaiFENesin  1 tablet Oral BID  . enoxaparin (LOVENOX) injection  40 mg Subcutaneous Q24H  . folic acid  1 mg Oral Daily  . gabapentin  300 mg Oral QHS  . magnesium oxide  400 mg Oral BID  . metoprolol tartrate  25 mg Oral BID  . multivitamin with minerals  1 tablet Oral Daily  . nicotine  21 mg Transdermal Daily  . senna-docusate  1 tablet Oral QHS  . thiamine injection  100 mg Intravenous Daily  . zinc sulfate  220 mg Oral Daily    Continuous Infusions: . dextrose 5 % and 0.9% NaCl 75 mL/hr at 02/10/20 0213    PRN Meds: acetaminophen, albuterol, bisacodyl, ipratropium, LORazepam **OR** LORazepam, ondansetron (ZOFRAN) IV    Vital Signs: BP (!) 144/89 (BP Location: Right Arm)   Pulse 78   Temp 98 F (36.7 C)   Resp 20   Ht 5\' 9"  (1.753 m)   Wt 73 kg   SpO2 95%   BMI 23.77 kg/m  SpO2: SpO2: 95 % O2 Device: O2 Device: Room Air O2 Flow Rate:    Intake/output summary:   Intake/Output Summary (Last 24 hours) at 02/10/2020 1241 Last data filed at 02/10/2020 02/12/2020 Gross per 24 hour  Intake  861.1 ml  Output -  Net 861.1 ml   LBM: Last BM Date: 02/07/20 Baseline Weight: Weight: 77.1 kg Most recent weight: Weight: 73 kg         Patient Active Problem List   Diagnosis Date Noted  . COVID-19 01/29/2020  . COVID-19 virus infection  01/28/2020  . Abnormal LFTs 01/28/2020  . CAD (coronary artery disease) 01/28/2020  . Atrial fibrillation, chronic (HCC) 01/28/2020  . Acute respiratory disease due to COVID-19 virus 01/28/2020  . Tobacco abuse 01/28/2020  . Acute pancreatitis 07/20/2019  . Hypomagnesemia 11/24/2018  . Protein-calorie malnutrition, severe 11/23/2018  . Pleural effusion on left   . Liver function test abnormality   . AKI (acute kidney injury) (HCC)   . Atrial fibrillation with rapid ventricular response (HCC)   . Acute hepatitis 11/17/2018  . Atrial fibrillation with RVR (HCC) 11/17/2018  . Alcohol abuse 11/17/2018  . Tobacco dependence syndrome 11/17/2018  . Empyema [facility])     Palliative Care Assessment & Plan    Recommendations/Plan:  Sons coming to see patient today. Will follow up tomorrow.    Code Status:    Code Status Orders  (From admission, onward)         Start     Ordered   01/28/20 1203  Full code  Continuous        01/28/20 1202        Code Status History    Date Active Date Inactive Code Status Order ID Comments User Context   07/20/2019 0230 07/20/2019 1411 Full Code [id number]  [facility] Vernetta Honey, MD ED   11/11/2018 2355 11/25/2018 1856 Full Code [id number]  Houston Siren, MD Inpatient   Advance Care Planning Activity       Prognosis:  Poor   Care plan was discussed with care team  Thank you for allowing the Palliative Medicine Team to assist in the care of this patient.   Total Time 15 min Prolonged Time Billed  no    COVID-19 DISASTER DECLARATION:    FULL CONTACT PHYSICAL EXAMINATION WAS NOT POSSIBLE DUE TO TREATMENT OF COVID-19  AND CONSERVATION OF PERSONAL PROTECTIVE EQUIPMENT   Patient assessed or the  symptoms described in the history of present illness.  In the context of the Global COVID-19 pandemic, which necessitated consideration that the patient might be at risk for infection with the SARS-CoV-2 virus that causes COVID-19, Institutional protocols and algorithms that pertain to the evaluation of patients at risk for COVID-19 are in a state of rapid change based on information released by regulatory bodies including the CDC and federal and state organizations. These policies and algorithms were followed during the patient's care while in hospital.    Greater than 50%  of this time was spent counseling and coordinating care related to the above assessment and plan.  Morton Stall, NP  Please contact Palliative Medicine Team phone at [phone] for questions and concerns.

## 2020-02-11 DIAGNOSIS — U071 COVID-19: Secondary | ICD-10-CM | POA: Diagnosis not present

## 2020-02-11 DIAGNOSIS — I482 Chronic atrial fibrillation, unspecified: Secondary | ICD-10-CM | POA: Diagnosis not present

## 2020-02-11 DIAGNOSIS — Z7189 Other specified counseling: Secondary | ICD-10-CM | POA: Diagnosis not present

## 2020-02-11 DIAGNOSIS — J069 Acute upper respiratory infection, unspecified: Secondary | ICD-10-CM | POA: Diagnosis not present

## 2020-02-11 DIAGNOSIS — F1010 Alcohol abuse, uncomplicated: Secondary | ICD-10-CM | POA: Diagnosis not present

## 2020-02-11 MED ORDER — POTASSIUM CHLORIDE CRYS ER 20 MEQ PO TBCR
40.0000 meq | EXTENDED_RELEASE_TABLET | ORAL | Status: AC
  Administered 2020-02-11: 40 meq via ORAL
  Filled 2020-02-11: qty 2

## 2020-02-11 NOTE — Progress Notes (Signed)
  Speech Language Pathology Treatment: Dysphagia  Patient Details Name: Thomas Coffey MRN: [id number] DOB: 1955-08-13 Today's Date: 02/11/2020 Time: 7619-5093 SLP Time Calculation (min) (ACUTE ONLY): 50 min  Assessment / Plan / Recommendation Clinical Impression  Pt seen for ongoing assessment of swallowing. He was recommended NPO status d/t increased overt s/s of aspiration previous session. Today, he appears improved and more alert; verbally responsive but w/ continued mumbled speech; able to follow basic instructions w/ Mod+ cues. Pt is on RA; wbc min elevated. Poor insight and awareness of self in bed. Less phlegm noted; NSG denied need for recent suctioning.    Pt explained general aspiration precautions and need for following them especially sitting upright for all oral intake; small bites/sips. Pt assisted w/ positioning d/t poor insight and overall weakness then given TSP trials of Nectar liquids, Honey lqiuids, purees. Delayed throat clear and cough noted w/ TSP trials of Nectar liquids(slowly, small). However, No immediate/delayed, overt clinical s/s of aspiration were noted w/ trials of Honey liquids and purees; respiratory status remained calm and unlabored, vocal quality clear b/t trials but low volume. Suspect pt is at risk to present w/ delayed pharyngeal swallow initiation d/t overall Cognitive decline. Pt was fed by TSP to monitor/control bolus size for better oropharyngeal control. Oral phase deficits noted c/b lengthier oral phase time for bolus management and A-P transfer for swallowing intermittently; oral clearing achieved w/ all consistencies given Time.  Post discussion w/ MD, recommend trial upgrade to Dysphagia level 1 diet (puree) w/ gravies added to moisten foods; Honey liquids for better oropharyngeal control and awareness overall to reduce risk for aspiration. Recommend general aspiration precautions; Pills Crushed in Puree; tray setup, positioning, and assistance feeding  at meals. Stop feeding po's if increased coughing, s/s of aspiration noted. ST services will continue to f/u w/ pt for toleration of diet and education as needed next 2-3 days. Trials to upgrade diet only when appropriate from a Cognitive status standpoint. MD/NSG updated. Precautions posted at bedside.      HPI HPI: Pt is a 65 y.o. male with medical history significant of alcohol abuse, tobacco abuse, alcoholic pancreatitis, CAD, myocardial infarction, atrial fibrillation not on anticoagulants, who presents with cough, abdominal pain.  COVID-19 positive.  Liver enzymes elevated currently. Chest x-ray showed acute bronchitic changes.  On CIWA protocol with as needed Ativan -- reduce Librium to 25 mg TID to reduce need for Ativan per MD notes.  Pt requires a Sitter d/t agitation and behavior.      SLP Plan  Continue with current plan of care       Recommendations  Diet recommendations: Dysphagia 1 (puree);Honey-thick liquid Liquids provided via: Teaspoon Medication Administration: Crushed with puree Supervision: Staff to assist with self feeding;Full supervision/cueing for compensatory strategies Compensations: Minimize environmental distractions;Slow rate;Small sips/bites;Follow solids with liquid Postural Changes and/or Swallow Maneuvers: Seated upright 90 degrees;Upright 30-60 min after meal                General recommendations:  (Palliative Care f/u; Dietician f/u) Oral Care Recommendations: Oral care BID;Oral care before and after PO;Staff/trained caregiver to provide oral care Follow up Recommendations: Skilled Nursing facility (TBD) SLP Visit Diagnosis: Dysphagia, oropharyngeal phase (R13.12) (Cognitive decline) Plan: Continue with current plan of care       GO                 Jerilynn Som, MS, CCC-SLP Speech Language Pathologist Rehab Services [phone] [facility] 02/11/2020, 2:45 PM

## 2020-02-11 NOTE — Progress Notes (Signed)
Patient transferred to 140B.  Report given to [facility].

## 2020-02-11 NOTE — Progress Notes (Signed)
PROGRESS NOTE    Thomas Coffey   ZGY:[id number]  DOB: 1955-08-05  PCP: Patient, [name]    DOA: 01/28/2020 LOS: 13   Brief Narrative   Thomas Coffey is a 65 y.o. male with medical history significant of alcohol abuse, tobacco abuse, alcoholic pancreatitis, CAD, myocardial infarction, atrial fibrillation not on anticoagulants, who presented to the ED on 01/28/2020 with 10 days of cough and body aches, in addition to progressive abdominal pain with nausea vomiting since the day before.  Evaluation in the ED revealed acute pancreatitis with lipase 518.  COVID-19 positive.  Liver enzymes elevated.  Chest x-ray showed acute bronchitic changes.  1/19: Made n.p.o. by speech therapy due to significant decline in the cognitive status and unable to stay awake. 1/20: Tapering his Librium 50->20 mg 3 times daily to see if it helps his mental status 1/21: He is more awake today and speech therapy was able to start him on dysphagia 1 diet.  He does cough and likely aspirating silently - his long-term nutrition remains a challenge and concerning for worsening severe malnutrition. taper his Librium from 20->10 mg 3 times daily 1/22: Per nursing report he has been more awake and was able to eat yesterday.  Did require some Ativan this morning for agitation.  We will continue tapering Librium 10->5 mg TID as needed 1/23 -discontinuing Librium and CIWA protocol. trial off any sedation.  Waiting for SNF 1/24: got very agitated per nursing. So CIWA had to be placed back. Nursing also concerned for constant aspiration when tries to feed.     Assessment & Plan   Principal Problem:   Acute respiratory disease due to COVID-19 virus Active Problems:   Alcohol abuse   Hypomagnesemia   Acute pancreatitis   Abnormal LFTs   CAD (coronary artery disease)   Atrial fibrillation, chronic (HCC)   Tobacco abuse   COVID-19   Acute metabolic encephalopathy -from alcohol withdrawal  CT head and MRI brain  negative for any acute findings  Alcohol abuse / Uncomplicated Alcohol Withdrawal - Son reports pt very heavy drinker, all day every day, both beer and liquor for entire adult life. He was not aware of prior withdrawal or if hx of seizure or DT's --Tapered off Librium, completed on 1/23 --Tried off CIWA and all sedation, but started agitation (kicking, pulling at lines and trying to throw things around the room per nursing) so CIWA resumed on 1/24 with PRN Ativan -- continue gabapentin  Aspiration pneumonia-resolved Dysphagia - pt constantly aspirating and is very high risk for recurrent pneumonia. --completed 5 days course of Unasyn on 1/23 -- Aspiration precautions --SLP following, currently on dysphagia 1 diet, honey thick liquids  Inadequate p.o. intake - seems acute on chronic issue based his malnutrition.  Continue D5-NS for maintenance.  Hypoglycemia -due to poor p.o. intake.  On D5-NS for maintenance.  Hypokalemia  / Hypomagnesemia -present on admission.  Repleted  COVID-19 infection -presented with 10-day history of body aches and cough, tested positive in the ED.  Has not been hypoxic.  Chest x-ray showed bronchitic changes. --Completed remdesivir, Steroids started on admission were stopped given no hypoxia -- Scheduled Mucinex, as needed albuterol -- Vitamin C, zinc -- Completed 5 day course doxycycline for secondary bronchitis  Acute pancreatitis -seems resolved.  Present on admission, due to alcohol abuse.   -- Diet as tolerated -- As needed Zofran  Sinus tachycardia - due to Etoh withdrawal most likely.  Now resolved on metoprolol  Hypertension - probably related to EtOH withdrawal. Lopressor given A-fib history and tachycardia.   BP improved.  Monitor.  Tobacco abuse -nicotine patch.  Smoking cessation recommended.  Transaminitis -likely due to alcohol abuse.  Monitor CMP closely since on remdesivir. RUQ ultrasound showed likely hepatic steatosis, gall  stones.  CAD -chronic, stable without active chest pain or acute ischemic changes on EKG.    A. fib, chronic - Has been normal sinus on tele,was tachycardic related to alcohol withdrawal, now rate controlled for most part.   -- Not on anticoagulation or rate control meds --Continue Lopressor for rate and BP --off tele    DVT prophylaxis: enoxaparin (LOVENOX) injection 40 mg Start: 01/28/20 2200   Diet:  Diet Orders (From admission, onward)    Start     Ordered   02/11/20 1002  DIET - DYS 1 Room service appropriate? Yes with Assist; Fluid consistency: Honey Thick  Diet effective now       Comments: Extra Gravy on meats, potatoes. Yogurt and puddings. Pt may have Oatmeal per Speech w/ butter/sugar.  Question Answer Comment  Room service appropriate? Yes with Assist   Fluid consistency: Honey Thick      02/11/20 1001            Code Status: Full Code    Subjective 02/11/20    Pt sedated when seen today. Sitter at bedside.  Patient responds briefly to voice along with sternal rub, denies complaints.  Does not stay awake to answer additional questions.  Sitter reports this is the best day he has had as far as less agitation when he is more alert.   Disposition Plan & Communication   Status is: Inpatient  Remains inpatient appropriate because:Awaiting SNF placement, unsafe discharge, ongoing aspiration, inadequate oral intake   Dispo:  Patient From: Home  Planned Disposition: Skilled Nursing Facility  Expected discharge date: 02/12/2020  Medically stable for discharge: No      Family Communication: None at bedside will attempt to call son this afternoon   Consults, Procedures, Significant Events   Consultants:   Palliative care  Procedures:   None  Antimicrobials:  Anti-infectives (From admission, onward)   Start     Dose/Rate Route Frequency Ordered Stop   02/04/20 2200  Ampicillin-Sulbactam (UNASYN) 3 g in sodium chloride 0.9 % 100 mL IVPB        3  g 200 mL/hr over 30 Minutes Intravenous Every 6 hours 02/04/20 1320 02/07/20 2359   02/03/20 1015  amoxicillin-clavulanate (AUGMENTIN) 875-125 MG per tablet 1 tablet  Status:  Discontinued        1 tablet Oral Every 12 hours 02/03/20 0929 02/04/20 1320   01/29/20 1000  azithromycin (ZITHROMAX) tablet 250 mg  Status:  Discontinued       "Followed by" Linked Group Details   250 mg Oral Daily 01/28/20 1010 01/28/20 1025   01/29/20 1000  remdesivir 100 mg in sodium chloride 0.9 % 100 mL IVPB       "Followed by" Linked Group Details   100 mg 200 mL/hr over 30 Minutes Intravenous Daily 01/28/20 1024 02/02/20 0959   01/28/20 1200  remdesivir 200 mg in sodium chloride 0.9% 250 mL IVPB       "Followed by" Linked Group Details   200 mg 580 mL/hr over 30 Minutes Intravenous Once 01/28/20 1024 01/28/20 1318   01/28/20 1030  doxycycline (VIBRA-TABS) tablet 100 mg  Status:  Discontinued        100 mg Oral  Every 12 hours 01/28/20 1025 02/01/20 1425   01/28/20 1015  azithromycin (ZITHROMAX) tablet 500 mg  Status:  Discontinued       "Followed by" Linked Group Details   500 mg Oral Daily 01/28/20 1010 01/28/20 1025        Objective   Vitals:   02/11/20 0035 02/11/20 0544 02/11/20 0712 02/11/20 1115  BP: 128/74 122/78 140/83 119/81  Pulse: 80  82 81  Resp: 16 16 19 19   Temp: 98 F (36.7 C) 98.5 F (36.9 C) 98.4 F (36.9 C) 98 F (36.7 C)  TempSrc:  Oral Oral   SpO2: 94%  100% 100%  Weight:      Height:        Intake/Output Summary (Last 24 hours) at 02/11/2020 1437 Last data filed at 02/11/2020 1100 Gross per 24 hour  Intake -  Output 350 ml  Net -350 ml   Filed Weights   01/28/20 0532 02/06/20 0500  Weight: 77.1 kg 73 kg    Physical Exam:  General exam: Sleeping comfortably, responds briefly to voice and gentle sternal rub, no acute distress Respiratory system: Symmetric chest rise, normal respiratory effort, on room air. Cardiovascular system: normal S1/S2, RRR, no pedal  edema.   Gastrointestinal system: soft, NT, ND Extremities: no edema, normal tone Skin: dry, intact, normal temperature   Labs   Data Reviewed: I have personally reviewed following labs and imaging studies  CBC: Recent Labs  Lab 02/05/20 0631 02/06/20 0624 02/07/20 0625 02/08/20 1017 02/09/20 0810  WBC 10.8* 11.4* 11.8* 11.9* 11.0*  NEUTROABS 9.1* 9.7* 9.6* 10.0*  --   HGB 15.1 13.0 15.1 14.3 12.5*  HCT 44.8 39.1 44.9 41.7 38.0*  MCV 93.9 94.7 93.2 92.5 94.3  PLT 276 290 334 304 332   Basic Metabolic Panel: Recent Labs  Lab 02/04/20 1622 02/05/20 0631 02/06/20 0624 02/07/20 0625 02/08/20 1017 02/08/20 1243 02/09/20 0810  NA 142 144 147* 147* 145  --  149*  K 4.0 3.7 3.2* 3.9 3.2*  --  2.8*  CL 108 107 112* 111 111  --  116*  CO2 24 26 25 24 23   --  25  GLUCOSE 109* 125* 110* 90 135*  --  121*  BUN 6* <5* <5* <5* <5*  --  <5*  CREATININE 0.58* 0.84 0.69 0.49* 0.59*  --  0.63  CALCIUM 9.2 9.4 8.9 9.2 8.5*  --  8.1*  MG 1.9  --   --   --   --  1.7 1.6*  PHOS 3.5  --   --   --   --   --  3.4   GFR: Estimated Creatinine Clearance: 93.3 mL/min (by C-G formula based on SCr of 0.63 mg/dL). Liver Function Tests: Recent Labs  Lab 02/05/20 0631 02/06/20 0624 02/07/20 0625 02/08/20 1017 02/09/20 0810  AST 105* 94* 119* 102* 83*  ALT 54* 48* 58* 53* 43  ALKPHOS 156* 128* 155* 138* 128*  BILITOT 2.3* 1.6* 1.6* 1.2 0.9  PROT 6.9 6.1* 7.2 6.1* 5.3*  ALBUMIN 2.3* 2.1* 2.5* 2.0* 1.8*   No results for input(s): LIPASE, AMYLASE in the last 168 hours. No results for input(s): AMMONIA in the last 168 hours. Coagulation Profile: No results for input(s): INR, PROTIME in the last 168 hours. Cardiac Enzymes: No results for input(s): CKTOTAL, CKMB, CKMBINDEX, TROPONINI in the last 168 hours. BNP (last 3 results) No results for input(s): PROBNP in the last 8760 hours. HbA1C: No results for input(s): HGBA1C in  the last 72 hours. CBG: No results for input(s): GLUCAP in the  last 168 hours. Lipid Profile: No results for input(s): CHOL, HDL, LDLCALC, TRIG, CHOLHDL, LDLDIRECT in the last 72 hours. Thyroid Function Tests: No results for input(s): TSH, T4TOTAL, FREET4, T3FREE, THYROIDAB in the last 72 hours. Anemia Panel: No results for input(s): VITAMINB12, FOLATE, FERRITIN, TIBC, IRON, RETICCTPCT in the last 72 hours. Sepsis Labs: Recent Labs  Lab 02/04/20 1622  PROCALCITON 0.76    No results found for this or any previous visit (from the past 240 hour(s)).    Imaging Studies   No results found.   Medications   Scheduled Meds: . amLODipine  5 mg Oral Daily  . vitamin C  500 mg Oral Daily  . aspirin EC  81 mg Oral Daily  . dextromethorphan-guaiFENesin  1 tablet Oral BID  . enoxaparin (LOVENOX) injection  40 mg Subcutaneous Q24H  . folic acid  1 mg Oral Daily  . gabapentin  300 mg Oral QHS  . magnesium oxide  400 mg Oral BID  . metoprolol tartrate  25 mg Oral BID  . multivitamin with minerals  1 tablet Oral Daily  . nicotine  21 mg Transdermal Daily  . senna-docusate  1 tablet Oral QHS  . thiamine injection  100 mg Intravenous Daily  . zinc sulfate  220 mg Oral Daily   Continuous Infusions: . dextrose 5 % and 0.9% NaCl 75 mL/hr at 02/10/20 0213       LOS: 13 days    Time spent: 30 minutes with greater than 50% spent at bedside and in coordination of care.    Pennie Banter, DO [facility]  02/11/2020, 2:37 PM    If 7PM-7AM, please contact night-coverage. How to contact the [facility] Attending or Consulting provider 7A - 7P or covering provider during after hours 7P -7A, for this patient?    1. Check the care team in [facility] and look for a) attending/consulting TR[facility] provider listed and b) the [facility] team listed 2. Log into [email or web address] and use [facility] universal password to access. If you do not have the password, please contact the hospital operator. 3. Locate the [facility] provider you are looking for under [facility] and  page to a number that you can be directly reached. 4. If you still have difficulty reaching the provider, please page the [facility] (Director on Call) for the Hospitalists listed on amion for assistance.

## 2020-02-11 NOTE — Progress Notes (Signed)
Physical Therapy Treatment Patient Details Name: Thomas Coffey MRN: [id number] DOB: 03-17-1955 Today's Date: 02/11/2020    History of Present Illness Thomas Coffey is a 65 y.o. male with medical history significant of alcohol abuse, tobacco abuse, alcoholic pancreatitis, CAD, myocardial infarction, atrial fibrillation not on anticoagulants, who presented to the ED on 01/28/2020 with 10 days of cough and body aches, in addition to progressive abdominal pain with nausea vomiting since the day before.     Evaluation in the ED revealed acute pancreatitis with lipase 518.  COVID-19 positive.  Liver enzymes elevated.  Chest x-ray showed acute bronchitic changes.[name] is a 65 y.o. male with medical history significant of alcohol abuse, tobacco abuse, alcoholic pancreatitis, CAD, myocardial infarction, atrial fibrillation not on anticoagulants, who presented to the ED on 01/28/2020 with 10 days of cough and body aches, in addition to progressive abdominal pain with nausea vomiting since the day before. Evaluation in the ED revealed acute pancreatitis with lipase 518.  COVID-19 positive.  Liver enzymes elevated.  Chest x-ray showed acute bronchitic changes.    PT Comments    Patient reclining sleeping in bed with sitter in room upon arrival. Wetness noted near the right side of the bed, later determined to be leaking IV, notified RN at end of session. Patient cooperative with additional time and encouragement. Patient continues to be severely confused but was able to respond to some commands today and required less physical assist. He required mod-maxA for bed mobility, min-modA for transfers, and mod A for ambulation with close chair follow.  He maintains stooped posture and continues to try to push Vine forward before bringing feet into it. Did have the insight to point out that the reason he could not advance RW was because therapist foot was blocking and did accept explanation that PT was  doing this on purpose to prevent more RW advancement until he advanced his feet appropriately. Overall, patient making mild progress towards goals since last session. Patient would benefit from continued skilled physical therapy to address remaining impairments and functional limitations to work towards stated goals and return to PLOF or maximal functional independence.     Follow Up Recommendations  SNF     Equipment Recommendations  None recommended by PT    Recommendations for Other Services       Precautions / Restrictions Precautions Precautions: Fall Restrictions Weight Bearing Restrictions: No    Mobility  Bed Mobility Overal bed mobility: Needs Assistance Bed Mobility: Sit to Supine;Supine to Sit     Supine to sit: HOB elevated;Mod assist Sit to supine: Max assist;HOB elevated;+2 for physical assistance   General bed mobility comments: patient requires assistance to move the legs and trunk in and out of bed. Does want to get up and initiates movement that direction. Needs convincing to get back in bed (patient to transfer to another room so needs to be back in bed for transport).  Transfers Overall transfer level: Needs assistance Equipment used: Rolling Hodge (2 wheeled) Transfers: Sit to/from Stand Sit to Stand: Mod assist;Min assist         General transfer comment: patient requires tactile cuing for hand placement on RW and to push off from/reach for bed/chair. Completed sit <> stand at edge of bed and edge of chair two reps each. Also to/from chair with mod A to help shift weight to take small steps. Tends to push RW away from body.  Ambulation/Gait Ambulation/Gait assistance: Mod assist;+2 safety/equipment Gait Distance (Feet): 4  Feet Assistive device: Rolling [place] (2 wheeled) Gait Pattern/deviations: Decreased step length - right;Decreased step length - left;Decreased stride length;Decreased weight shift to left;Decreased weight shift to right;Trunk  flexed Gait velocity: decreased   General Gait Details: Patient ambulates to edge of room with very close chair follow. Maintains stooped postion and requires PT support to stay upright enough to take steps. intermittantly needs physical assist from PT to shift weight and strong cuing for sequencing and to take bigger steps.   Stairs             Wheelchair Mobility    Modified Rankin (Stroke Patients Only)       Balance Overall balance assessment: Needs assistance Sitting-balance support: Feet supported;Bilateral upper extremity supported Sitting balance-Leahy Scale: Good Sitting balance - Comments: supervision sitting EOB   Standing balance support: Bilateral upper extremity supported Standing balance-Leahy Scale: Poor Standing balance comment: requires B UE support, min A to stay standing. Requires frequent tactile cuing at buttocks and sternum to stand up tall                            Cognition Arousal/Alertness: Lethargic Behavior During Therapy: Flat affect Overall Cognitive Status: Impaired/Different from baseline Area of Impairment: Orientation;Memory;Attention;Safety/judgement;Following commands;Problem solving;Awareness                 Orientation Level: Disoriented to;Place;Time;Situation   Memory: Decreased short-term memory;Decreased recall of precautions Following Commands: Follows one step commands inconsistently;Follows one step commands with increased time Safety/Judgement: Decreased awareness of deficits;Decreased awareness of safety Awareness: Emergent;Anticipatory;Intellectual Problem Solving: Decreased initiation;Requires tactile cues;Requires verbal cues;Slow processing;Difficulty sequencing General Comments: Patient with sitter in room. Able to make some appropriate observations but very confused overall.      Exercises Other Exercises Other Exercises: Patient practiced functional bed mobiltiy, transfers, and gait as well as  seated and standing balance.    General Comments General comments (skin integrity, edema, etc.): SpO2 and HR appeared to remain WFL during sesson.      Pertinent Vitals/Pain Pain Assessment: Faces Faces Pain Scale: No hurt    Home Living                      Prior Function            PT Goals (current goals can now be found in the care plan section) Acute Rehab PT Goals Patient Stated Goal: "get my shoes" PT Goal Formulation: With patient Time For Goal Achievement: 02/25/20 Potential to Achieve Goals: Fair Progress towards PT goals: Progressing toward goals    Frequency    Min 2X/week      PT Plan Current plan remains appropriate    Co-evaluation              AM-PAC PT "6 Clicks" Mobility   Outcome Measure  Help needed turning from your back to your side while in a flat bed without using bedrails?: A Lot Help needed moving from lying on your back to sitting on the side of a flat bed without using bedrails?: A Lot Help needed moving to and from a bed to a chair (including a wheelchair)?: A Lot Help needed standing up from a chair using your arms (e.g., wheelchair or bedside chair)?: A Lot Help needed to walk in hospital room?: A Lot Help needed climbing 3-5 steps with a railing? : Total 6 Click Score: 11    End of Session Equipment Utilized During Treatment: Gait  belt Activity Tolerance: Other (comment) (limited by confusion) Patient left: in bed;with nursing/sitter in room Nurse Communication: Mobility status PT Visit Diagnosis: Unsteadiness on feet (R26.81);Difficulty in walking, not elsewhere classified (R26.2);Muscle weakness (generalized) (M62.81)     Time: 1535-1610 PT Time Calculation (min) (ACUTE ONLY): 35 min  Charges:  $Therapeutic Activity: 23-37 mins                     Luretha Murphy. Ilsa Iha, PT, DPT 02/11/20, 4:27 PM

## 2020-02-11 NOTE — Progress Notes (Signed)
Nutrition Follow-up  DOCUMENTATION CODES:   Severe malnutrition in context of social or environmental circumstances  INTERVENTION:   Provide Magic cup TID with meals, each supplement provides 290 kcal and 9 grams of protein.  Honey Thick Mighty Shake TID, each supplement provides 200kcal and 7g protein   MVI daily   Pt at high refeed risk; recommend monitor potassium, magnesium and phosphorus labs daily until stable  If family wishes to continue with full aggressive care, recommend placement of small-bore NGT (Dobbhoff) and initiation of tube feeds   -Initiate Osmolite 1.5 Cal at 20 mL/hr and advance by 15 mL/hr every 12 hours to goal rate of 50 mL/hr per tube -Provide PROSource TF 45 mL BID per tube -Goal regimen provides 1880 kcal, 97 grams of protein, 912 mL H2O daily -Recommend free water flush of 150 mL Q4hrs per tube, which would provide 1812 mL H2O daily including water in TF regimen  NUTRITION DIAGNOSIS:   Severe Malnutrition related to social / environmental circumstances (EtOH abuse, suspected inadequate oral intake) as evidenced by severe fat depletion,severe muscle depletion. Ongoing.   GOAL:   Patient will meet greater than or equal to 90% of their needs Not met at this time.  MONITOR:   PO intake,Supplement acceptance,Labs,Weight trends,Skin,I & O's  ASSESSMENT:   65 year old male with PMHx of EtOH abuse, tobacco abuse, alcoholic pancreatitis, CAD, MI, A-fib not on anticoagulants admitted with [id number], acute metabolic encephalopathy most likely due to EtOH withdrawal, aspiration PNA, acute pancreatitis now resolved.  Pt seen by SLP today and placed on a dysphagia 1/honey thick diet. Forty eight hour calorie count completed on 1/24; pt was found to only be meeting 4% of his estimated needs. Nasogastric tube was recommended but family was meeting with palliative care so nasogastric tube was not discussed with family. Palliative care note from today reports that  family wishes to continue with full aggressive care. Would recommend nasogastric tube and feeds as pt will not likely be able to eat enough to meet his estimated needs. Discussed this with MD; MD will discuss with family. Pt is at high refeed risk.   No new weight since admit; pt is ordered for weekly weights  Medications reviewed and include: vitamin C, aspirin, lovenox, folic acid, Mg oxide, MVI, nicotine, KCl, senokot, thiamine, zinc, NaCl w/ 5% dextrose _0 /hr  Labs reviewed: Na 149(H), K 2.8 wnl, BUN <5(L), P 3.4 wnl, Mg 1.6(L) Wbc- 11.0(H)  Diet Order:    Diet Order            DIET - DYS 1 Room service appropriate? Yes with Assist; Fluid consistency: Honey Thick  Diet effective now                EDUCATION NEEDS:   No education needs have been identified at this time  Skin:  Skin Assessment: Reviewed RN Assessment  Last BM:  1/22- type 7  Height:   Ht Readings from Last 1 Encounters:  01/28/20 _1  (1.753 m)   Weight:   Wt Readings from Last 1 Encounters:  02/06/20 73 kg   Ideal Body Weight:  72.7 kg  BMI:  Body mass index is 23.77 kg/m.  Estimated Nutritional Needs:   Kcal:  1800-2000  Protein:  90-100 grams  Fluid:  1.8-2 L/day  Thomas Distance MS, RD, LDN Please refer to [facility] for RD and/or RD on-call/weekend/after hours pager

## 2020-02-11 NOTE — Progress Notes (Addendum)
Daily Progress Note   Patient Name: Thomas Coffey       Date: 02/11/2020 DOB: Jul 23, 1955  Age: 65 y.o. MRN#: [id number] Attending Physician: Pennie Banter, DO Primary Care Physician: Patient, [name] Admit Date: 01/28/2020  Reason for Consultation/Follow-up: Establishing goals of care  Subjective: Patient is in isolation. Spoke with both sons, and they state he recognized them, and part of what he said made sense, but he would say things that did not.  They state the staff cannot understand his complaints but they can, and he complained of hunger and back pain; the sons witnessed no agitation.They state they would like to be present and assist with his care as much as they are able. They state they feel that if he could be nourished, he would improve. The sons tell me he looks better than he has in a while. Continue full code/full scope.   Length of Stay: 13  Current Medications: Scheduled Meds:  . amLODipine  5 mg Oral Daily  . vitamin C  500 mg Oral Daily  . aspirin EC  81 mg Oral Daily  . dextromethorphan-guaiFENesin  1 tablet Oral BID  . enoxaparin (LOVENOX) injection  40 mg Subcutaneous Q24H  . folic acid  1 mg Oral Daily  . gabapentin  300 mg Oral QHS  . magnesium oxide  400 mg Oral BID  . metoprolol tartrate  25 mg Oral BID  . multivitamin with minerals  1 tablet Oral Daily  . nicotine  21 mg Transdermal Daily  . senna-docusate  1 tablet Oral QHS  . thiamine injection  100 mg Intravenous Daily  . zinc sulfate  220 mg Oral Daily    Continuous Infusions: . dextrose 5 % and 0.9% NaCl 75 mL/hr at 02/10/20 0213    PRN Meds: acetaminophen, albuterol, bisacodyl, ipratropium, LORazepam **OR** LORazepam, ondansetron (ZOFRAN) IV      Vital Signs: BP 119/81 (BP Location:  Right Arm)   Pulse 81   Temp 98 F (36.7 C)   Resp 19   Ht 5\' 9"  (1.753 m)   Wt 73 kg   SpO2 100%   BMI 23.77 kg/m  SpO2: SpO2: 100 % O2 Device: O2 Device: Room Air O2 Flow Rate:    Intake/output summary:   Intake/Output Summary (Last 24 hours) at 02/11/2020  1326 Last data filed at 02/11/2020 1100 Gross per 24 hour  Intake --  Output 350 ml  Net -350 ml   LBM: Last BM Date: 02/07/20 Baseline Weight: Weight: 77.1 kg Most recent weight: Weight: 73 kg         Patient Active Problem List   Diagnosis Date Noted  . COVID-19 01/29/2020  . COVID-19 virus infection 01/28/2020  . Abnormal LFTs 01/28/2020  . CAD (coronary artery disease) 01/28/2020  . Atrial fibrillation, chronic (HCC) 01/28/2020  . Acute respiratory disease due to COVID-19 virus 01/28/2020  . Tobacco abuse 01/28/2020  . Acute pancreatitis 07/20/2019  . Hypomagnesemia 11/24/2018  . Protein-calorie malnutrition, severe 11/23/2018  . Pleural effusion on left   . Liver function test abnormality   . AKI (acute kidney injury) (HCC)   . Atrial fibrillation with rapid ventricular response (HCC)   . Acute hepatitis 11/17/2018  . Atrial fibrillation with RVR (HCC) 11/17/2018  . Alcohol abuse 11/17/2018  . Tobacco dependence syndrome 11/17/2018  . Empyema [facility])     Palliative Care Assessment & Plan   Recommendations/Plan:  Full code/full scope.   Code Status:    Code Status Orders  (From admission, onward)         Start     Ordered   01/28/20 1203  Full code  Continuous        01/28/20 1202        Code Status History    Date Active Date Inactive Code Status Order ID Comments User Context   07/20/2019 0230 07/20/2019 1411 Full Code [id number]  Arville Care Vernetta Honey, MD ED   11/11/2018 2355 11/25/2018 1856 Full Code [id number]  Houston Siren, MD Inpatient   Advance Care Planning Activity       Prognosis:  Poor overall    Thank you for allowing the Palliative Medicine Team to assist in the care of  this patient.   Total Time 15 min Prolonged Time Billed  no      Greater than 50%  of this time was spent counseling and coordinating care related to the above assessment and plan.  Morton Stall, NP  Please contact Palliative Medicine Team phone at [phone] for questions and concerns.

## 2020-02-11 NOTE — Plan of Care (Signed)

## 2020-02-12 DIAGNOSIS — K852 Alcohol induced acute pancreatitis without necrosis or infection: Secondary | ICD-10-CM | POA: Diagnosis not present

## 2020-02-12 DIAGNOSIS — F1010 Alcohol abuse, uncomplicated: Secondary | ICD-10-CM | POA: Diagnosis not present

## 2020-02-12 DIAGNOSIS — J069 Acute upper respiratory infection, unspecified: Secondary | ICD-10-CM | POA: Diagnosis not present

## 2020-02-12 DIAGNOSIS — I482 Chronic atrial fibrillation, unspecified: Secondary | ICD-10-CM | POA: Diagnosis not present

## 2020-02-12 DIAGNOSIS — Z7189 Other specified counseling: Secondary | ICD-10-CM | POA: Diagnosis not present

## 2020-02-12 DIAGNOSIS — Z515 Encounter for palliative care: Secondary | ICD-10-CM | POA: Diagnosis not present

## 2020-02-12 DIAGNOSIS — U071 COVID-19: Secondary | ICD-10-CM | POA: Diagnosis not present

## 2020-02-12 LAB — COMPREHENSIVE METABOLIC PANEL
ALT: 37 U/L (ref 0–44)
AST: 78 U/L — ABNORMAL HIGH (ref 15–41)
Albumin: 1.9 g/dL — ABNORMAL LOW (ref 3.5–5.0)
Alkaline Phosphatase: 142 U/L — ABNORMAL HIGH (ref 38–126)
Anion gap: 9 (ref 5–15)
BUN: <5 mg/dL — ABNORMAL LOW (ref 8–23)
CO2: 24 mmol/L (ref 22–32)
Calcium: 8.2 mg/dL — ABNORMAL LOW (ref 8.9–10.3)
Chloride: 112 mmol/L — ABNORMAL HIGH (ref 98–111)
Creatinine, Ser: 0.56 mg/dL — ABNORMAL LOW (ref 0.61–1.24)
GFR, Estimated: >60 mL/min (ref >60)
Glucose, Bld: 72 mg/dL (ref 70–99)
Potassium: 3.1 mmol/L — ABNORMAL LOW (ref 3.5–5.1)
Sodium: 145 mmol/L (ref 135–145)
Total Bilirubin: 0.8 mg/dL (ref 0.3–1.2)
Total Protein: 5.6 g/dL — ABNORMAL LOW (ref 6.5–8.1)

## 2020-02-12 LAB — CBC
HCT: 39 % (ref 39.0–52.0)
Hemoglobin: 13 g/dL (ref 13.0–17.0)
MCH: 31.8 pg (ref 26.0–34.0)
MCHC: 33.3 g/dL (ref 30.0–36.0)
MCV: 95.4 fL (ref 80.0–100.0)
Platelets: 369 10*3/uL (ref 150–400)
RBC: 4.09 MIL/uL — ABNORMAL LOW (ref 4.22–5.81)
RDW: 13.5 % (ref 11.5–15.5)
WBC: 8.7 10*3/uL (ref 4.0–10.5)
nRBC: 0 % (ref 0.0–0.2)

## 2020-02-12 LAB — MAGNESIUM: Magnesium: 1.8 mg/dL (ref 1.7–2.4)

## 2020-02-12 MED ORDER — POTASSIUM CHLORIDE CRYS ER 20 MEQ PO TBCR
40.0000 meq | EXTENDED_RELEASE_TABLET | ORAL | Status: AC
  Administered 2020-02-12 (×2): 40 meq via ORAL
  Filled 2020-02-12 (×2): qty 2

## 2020-02-12 NOTE — Progress Notes (Signed)
Daily Progress Note   Patient Name: Thomas Coffey       Date: 02/12/2020 DOB: February 24, 1955  Age: 65 y.o. MRN#: [id number] Attending Physician: Pennie Banter, DO Primary Care Physician: Patient, [name] Admit Date: 01/28/2020  Reason for Consultation/Follow-up: Establishing goals of care  Subjective: Patient is sitting in bed with sitter at bedside. No mittens in place. He is calm and cooperative. Much of what he says I cannot understand, he appears confused in words I can understand. No family at bedside.   Per sitter, patient continuing to cough with oral intake. Message sent to SLP and attending.   Spoke with Desma Maxim, and updated him. Discussed concerns for his father's swallowing after speaking with sitter. Discussed recommendations for SNF per PT.  Discussed keeping in the forefront what his father would want both here in the hospital and beyond. They will talk this evening about a feeding tube vs continuing oral intake. Advised him that attending team will follow, and I will f/u Monday on my return.     Length of Stay: 14  Current Medications: Scheduled Meds:  . amLODipine  5 mg Oral Daily  . vitamin C  500 mg Oral Daily  . aspirin EC  81 mg Oral Daily  . dextromethorphan-guaiFENesin  1 tablet Oral BID  . enoxaparin (LOVENOX) injection  40 mg Subcutaneous Q24H  . folic acid  1 mg Oral Daily  . gabapentin  300 mg Oral QHS  . magnesium oxide  400 mg Oral BID  . metoprolol tartrate  25 mg Oral BID  . multivitamin with minerals  1 tablet Oral Daily  . nicotine  21 mg Transdermal Daily  . potassium chloride  40 mEq Oral Q4H  . senna-docusate  1 tablet Oral QHS  . thiamine injection  100 mg Intravenous Daily  . zinc sulfate  220 mg Oral Daily    Continuous Infusions: .  dextrose 5 % and 0.9% NaCl 75 mL/hr at 02/10/20 0213    PRN Meds: acetaminophen, albuterol, bisacodyl, ipratropium, ondansetron (ZOFRAN) IV  Physical Exam Pulmonary:     Effort: Pulmonary effort is normal.  Skin:    General: Skin is warm and dry.  Neurological:     Mental Status: He is alert.  Vital Signs: BP 115/82 (BP Location: Left Arm)   Pulse 99   Temp 98.6 F (37 C) (Oral)   Resp 18   Ht 5\' 9"  (1.753 m)   Wt 73 kg   SpO2 92% Comment: Room Air  BMI 23.77 kg/m  SpO2: SpO2: 92 % (Room Air) O2 Device: O2 Device: Room Air O2 Flow Rate:    Intake/output summary:   Intake/Output Summary (Last 24 hours) at 02/12/2020 1336 Last data filed at 02/12/2020 0930 Gross per 24 hour  Intake --  Output 226 ml  Net -226 ml   LBM: Last BM Date: 02/07/20 Baseline Weight: Weight: 77.1 kg Most recent weight: Weight: 73 kg     Patient Active Problem List   Diagnosis Date Noted  . COVID-19 01/29/2020  . COVID-19 virus infection 01/28/2020  . Abnormal LFTs 01/28/2020  . CAD (coronary artery disease) 01/28/2020  . Atrial fibrillation, chronic (HCC) 01/28/2020  . Acute respiratory disease due to COVID-19 virus 01/28/2020  . Tobacco abuse 01/28/2020  . Acute pancreatitis 07/20/2019  . Hypomagnesemia 11/24/2018  . Protein-calorie malnutrition, severe 11/23/2018  . Pleural effusion on left   . Liver function test abnormality   . AKI (acute kidney injury) (HCC)   . Atrial fibrillation with rapid ventricular response (HCC)   . Acute hepatitis 11/17/2018  . Atrial fibrillation with RVR (HCC) 11/17/2018  . Alcohol abuse 11/17/2018  . Tobacco dependence syndrome 11/17/2018  . Empyema [facility])     Palliative Care Assessment & Plan     Recommendations/Plan: Sons to discuss feeding tube vs continued oral intake.    Code Status:    Code Status Orders  (From admission, onward)         Start     Ordered   01/28/20 1203  Full code  Continuous        01/28/20  1202        Code Status History    Date Active Date Inactive Code Status Order ID Comments User Context   07/20/2019 0230 07/20/2019 1411 Full Code 09/20/2019  [id number] Arville Care, MD ED   11/11/2018 2355 11/25/2018 1856 Full Code 13/09/2018  [id number], MD Inpatient   Advance Care Planning Activity      Prognosis:  Unable to determine   Care plan was discussed with SLP and MD via epic chat  Thank you for allowing the Palliative Medicine Team to assist in the care of this patient.   Total Time 35 min Prolonged Time Billed  no      Greater than 50%  of this time was spent counseling and coordinating care related to the above assessment and plan.  Houston Siren, NP  Please contact Palliative Medicine Team phone at [phone] for questions and concerns.

## 2020-02-12 NOTE — Progress Notes (Addendum)
PROGRESS NOTE    Thomas Coffey   WUX:[id number]  DOB: 1955/05/11  PCP: Patient, [name]    DOA: 01/28/2020 LOS: 14   Brief Narrative   Thomas Coffey is a 65 y.o. male with medical history significant of alcohol abuse, tobacco abuse, alcoholic pancreatitis, CAD, myocardial infarction, atrial fibrillation not on anticoagulants, who presented to the ED on 01/28/2020 with 10 days of cough and body aches, in addition to progressive abdominal pain with nausea vomiting since the day before.  Evaluation in the ED revealed acute pancreatitis with lipase 518.  COVID-19 positive.  Liver enzymes elevated.  Chest x-ray showed acute bronchitic changes.  1/19: Made n.p.o. by speech therapy due to significant decline in the cognitive status and unable to stay awake. 1/20: Tapering his Librium 50->20 mg 3 times daily to see if it helps his mental status 1/21: He is more awake today and speech therapy was able to start him on dysphagia 1 diet.  He does cough and likely aspirating silently - his long-term nutrition remains a challenge and concerning for worsening severe malnutrition. taper his Librium from 20->10 mg 3 times daily 1/22: Per nursing report he has been more awake and was able to eat yesterday.  Did require some Ativan this morning for agitation.  We will continue tapering Librium 10->5 mg TID as needed 1/23 -discontinuing Librium and CIWA protocol. trial off any sedation.  Waiting for SNF 1/24: got very agitated per nursing. So CIWA had to be placed back. Nursing also concerned for constant aspiration when tries to feed.   1/27: again made NPO as sitter reports very coarse cough with feeding pt.     Assessment & Plan   Principal Problem:   Acute respiratory disease due to COVID-19 virus Active Problems:   Alcohol abuse   Hypomagnesemia   Acute pancreatitis   Abnormal LFTs   CAD (coronary artery disease)   Atrial fibrillation, chronic (HCC)   Tobacco abuse    COVID-19   Acute metabolic encephalopathy -from alcohol withdrawal  CT head and MRI brain negative for any acute findings  Alcohol abuse / Uncomplicated Alcohol Withdrawal - Son reports pt very heavy drinker, all day every day, both beer and liquor for entire adult life. He was not aware of prior withdrawal or if hx of seizure or DT's --Tapered off Librium, completed on 1/23 --Tried off CIWA and all sedation, but started agitation (kicking, pulling at lines and trying to throw things around the room per nursing) so CIWA resumed on 1/24 with PRN Ativan -- continue gabapentin  Aspiration pneumonia-resolved Dysphagia - pt constantly aspirating and is very high risk for recurrent pneumonia.  Made n.p.o. again 1/27 --completed 5 days course of Unasyn on 1/23 -- Aspiration precautions --SLP following, currently n.p.o.  Inadequate p.o. intake - seems acute on chronic issue based his malnutrition.  Continue D5-NS for maintenance.  Again made n.p.o. 1/27.  Palliative care following, goals of care discussions ongoing.  Should start tube feeds if continuing full scope of care.  Hypoglycemia -due to poor p.o. intake.  On D5-NS for maintenance.  Hypokalemia  / Hypomagnesemia -present on admission.  Repleted  COVID-19 infection -presented with 10-day history of body aches and cough, tested positive in the ED.  Has not been hypoxic.  Chest x-ray showed bronchitic changes. --Completed remdesivir, Steroids started on admission were stopped given no hypoxia -- Scheduled Mucinex, as needed albuterol -- Vitamin C, zinc -- Completed 5 day course doxycycline for secondary bronchitis  Acute pancreatitis -seems resolved.  Present on admission, due to alcohol abuse.   -- Diet as tolerated -- As needed Zofran  Sinus tachycardia - due to Etoh withdrawal most likely.  Now resolved on metoprolol  Hypertension - probably related to EtOH withdrawal. Lopressor given A-fib history and tachycardia.   BP  improved.  Monitor.  Tobacco abuse -nicotine patch.  Smoking cessation recommended.  Transaminitis -likely due to alcohol abuse.  Monitor CMP closely since on remdesivir. RUQ ultrasound showed likely hepatic steatosis, gall stones.  CAD -chronic, stable without active chest pain or acute ischemic changes on EKG.    A. fib, chronic - Has been normal sinus on tele,was tachycardic related to alcohol withdrawal, now rate controlled for most part.   -- Not on anticoagulation or rate control meds --Continue Lopressor for rate and BP --off tele    DVT prophylaxis: enoxaparin (LOVENOX) injection 40 mg Start: 01/28/20 2200   Diet:  Diet Orders (From admission, onward)    Start     Ordered   02/12/20 1346  Diet NPO time specified  Diet effective now        02/12/20 1345            Code Status: Full Code    Subjective 02/12/20    Pt again sleeping comfortably, sedated when seen today.  Sitter at bedside.  Patient does awake to voice briefly but I am unable to understand his speech as usual.  Sitter reports patient was given Ativan this morning.  Sitter feels unsafe feeding patient due to coarse sounding cough and concern for aspiration.   Disposition Plan & Communication   Status is: Inpatient  Remains inpatient appropriate because:Awaiting SNF placement, unsafe discharge, ongoing aspiration, inadequate oral intake   Dispo:  Patient From: Home  Planned Disposition: Skilled Nursing Facility  Expected discharge date: 02/12/2020  Medically stable for discharge: No      Family Communication: None at bedside will attempt to call son this afternoon   Consults, Procedures, Significant Events   Consultants:   Palliative care  Procedures:   None  Antimicrobials:  Anti-infectives (From admission, onward)   Start     Dose/Rate Route Frequency Ordered Stop   02/04/20 2200  Ampicillin-Sulbactam (UNASYN) 3 g in sodium chloride 0.9 % 100 mL IVPB        3 g 200 mL/hr  over 30 Minutes Intravenous Every 6 hours 02/04/20 1320 02/07/20 2359   02/03/20 1015  amoxicillin-clavulanate (AUGMENTIN) 875-125 MG per tablet 1 tablet  Status:  Discontinued        1 tablet Oral Every 12 hours 02/03/20 0929 02/04/20 1320   01/29/20 1000  azithromycin (ZITHROMAX) tablet 250 mg  Status:  Discontinued       "Followed by" Linked Group Details   250 mg Oral Daily 01/28/20 1010 01/28/20 1025   01/29/20 1000  remdesivir 100 mg in sodium chloride 0.9 % 100 mL IVPB       "Followed by" Linked Group Details   100 mg 200 mL/hr over 30 Minutes Intravenous Daily 01/28/20 1024 02/02/20 0959   01/28/20 1200  remdesivir 200 mg in sodium chloride 0.9% 250 mL IVPB       "Followed by" Linked Group Details   200 mg 580 mL/hr over 30 Minutes Intravenous Once 01/28/20 1024 01/28/20 1318   01/28/20 1030  doxycycline (VIBRA-TABS) tablet 100 mg  Status:  Discontinued        100 mg Oral Every 12 hours 01/28/20 1025 02/01/20 1425  01/28/20 1015  azithromycin (ZITHROMAX) tablet 500 mg  Status:  Discontinued       "Followed by" Linked Group Details   500 mg Oral Daily 01/28/20 1010 01/28/20 1025        Objective   Vitals:   02/11/20 1115 02/11/20 1636 02/11/20 2005 02/12/20 1056  BP: 119/81 124/80 128/90 115/82  Pulse: 81 84 90 99  Resp: 19 18 18 18   Temp: 98 F (36.7 C) 98.6 F (37 C) 98.6 F (37 C) 98.6 F (37 C)  TempSrc:  Oral Oral Oral  SpO2: 100% 97% 94% 92%  Weight:      Height:        Intake/Output Summary (Last 24 hours) at 02/12/2020 1345 Last data filed at 02/12/2020 0930 Gross per 24 hour  Intake --  Output 226 ml  Net -226 ml   Filed Weights   01/28/20 0532 02/06/20 0500  Weight: 77.1 kg 73 kg    Physical Exam:  General exam: sedated/sleeping, wakes to voice only briefly, no acute distress Respiratory system: Symmetric chest rise, normal respiratory effort, on room air. Cardiovascular system: normal S1/S2, RRR, no pedal edema.   Gastrointestinal system:  soft, NT, ND Extremities: no edema, normal tone Skin: dry, intact, normal temperature   Labs   Data Reviewed: I have personally reviewed following labs and imaging studies  CBC: Recent Labs  Lab 02/06/20 0624 02/07/20 0625 02/08/20 1017 02/09/20 0810 02/12/20 0552  WBC 11.4* 11.8* 11.9* 11.0* 8.7  NEUTROABS 9.7* 9.6* 10.0*  --   --   HGB 13.0 15.1 14.3 12.5* 13.0  HCT 39.1 44.9 41.7 38.0* 39.0  MCV 94.7 93.2 92.5 94.3 95.4  PLT 290 334 304 332 369   Basic Metabolic Panel: Recent Labs  Lab 02/06/20 0624 02/07/20 0625 02/08/20 1017 02/08/20 1243 02/09/20 0810 02/12/20 0552  NA 147* 147* 145  --  149* 145  K 3.2* 3.9 3.2*  --  2.8* 3.1*  CL 112* 111 111  --  116* 112*  CO2 25 24 23   --  25 24  GLUCOSE 110* 90 135*  --  121* 72  BUN <5* <5* <5*  --  <5* <5*  CREATININE 0.69 0.49* 0.59*  --  0.63 0.56*  CALCIUM 8.9 9.2 8.5*  --  8.1* 8.2*  MG  --   --   --  1.7 1.6* 1.8  PHOS  --   --   --   --  3.4  --    GFR: Estimated Creatinine Clearance: 93.3 mL/min (A) (by C-G formula based on SCr of 0.56 mg/dL (L)). Liver Function Tests: Recent Labs  Lab 02/06/20 0624 02/07/20 0625 02/08/20 1017 02/09/20 0810 02/12/20 0552  AST 94* 119* 102* 83* 78*  ALT 48* 58* 53* 43 37  ALKPHOS 128* 155* 138* 128* 142*  BILITOT 1.6* 1.6* 1.2 0.9 0.8  PROT 6.1* 7.2 6.1* 5.3* 5.6*  ALBUMIN 2.1* 2.5* 2.0* 1.8* 1.9*   No results for input(s): LIPASE, AMYLASE in the last 168 hours. No results for input(s): AMMONIA in the last 168 hours. Coagulation Profile: No results for input(s): INR, PROTIME in the last 168 hours. Cardiac Enzymes: No results for input(s): CKTOTAL, CKMB, CKMBINDEX, TROPONINI in the last 168 hours. BNP (last 3 results) No results for input(s): PROBNP in the last 8760 hours. HbA1C: No results for input(s): HGBA1C in the last 72 hours. CBG: No results for input(s): GLUCAP in the last 168 hours. Lipid Profile: No results for input(s): CHOL, HDL,  LDLCALC, TRIG,  CHOLHDL, LDLDIRECT in the last 72 hours. Thyroid Function Tests: No results for input(s): TSH, T4TOTAL, FREET4, T3FREE, THYROIDAB in the last 72 hours. Anemia Panel: No results for input(s): VITAMINB12, FOLATE, FERRITIN, TIBC, IRON, RETICCTPCT in the last 72 hours. Sepsis Labs: No results for input(s): PROCALCITON, LATICACIDVEN in the last 168 hours.  No results found for this or any previous visit (from the past 240 hour(s)).    Imaging Studies   No results found.   Medications   Scheduled Meds: . amLODipine  5 mg Oral Daily  . vitamin C  500 mg Oral Daily  . aspirin EC  81 mg Oral Daily  . dextromethorphan-guaiFENesin  1 tablet Oral BID  . enoxaparin (LOVENOX) injection  40 mg Subcutaneous Q24H  . folic acid  1 mg Oral Daily  . gabapentin  300 mg Oral QHS  . magnesium oxide  400 mg Oral BID  . metoprolol tartrate  25 mg Oral BID  . multivitamin with minerals  1 tablet Oral Daily  . nicotine  21 mg Transdermal Daily  . potassium chloride  40 mEq Oral Q4H  . senna-docusate  1 tablet Oral QHS  . thiamine injection  100 mg Intravenous Daily  . zinc sulfate  220 mg Oral Daily   Continuous Infusions: . dextrose 5 % and 0.9% NaCl 75 mL/hr at 02/10/20 0213       LOS: 14 days    Time spent: 25 minutes with greater than 50% spent at bedside and in coordination of care.    Pennie Banter, DO [facility]  02/12/2020, 1:45 PM    If 7PM-7AM, please contact night-coverage. How to contact the [facility] Attending or Consulting provider 7A - 7P or covering provider during after hours 7P -7A, for this patient?    1. Check the care team in [facility] and look for a) attending/consulting [facility] provider listed and b) the [facility] team listed 2. Log into [email or web address] and use [place]'s universal password to access. If you do not have the password, please contact the hospital operator. 3. Locate the [facility] provider you are looking for under [facility] and page to a number that you  can be directly reached. 4. If you still have difficulty reaching the provider, please page the [facility] (Director on Call) for the Hospitalists listed on amion for assistance.

## 2020-02-13 DIAGNOSIS — F1010 Alcohol abuse, uncomplicated: Secondary | ICD-10-CM | POA: Diagnosis not present

## 2020-02-13 DIAGNOSIS — U071 COVID-19: Secondary | ICD-10-CM | POA: Diagnosis not present

## 2020-02-13 DIAGNOSIS — I482 Chronic atrial fibrillation, unspecified: Secondary | ICD-10-CM | POA: Diagnosis not present

## 2020-02-13 DIAGNOSIS — R627 Adult failure to thrive: Secondary | ICD-10-CM | POA: Diagnosis present

## 2020-02-13 LAB — BASIC METABOLIC PANEL
Anion gap: 8 (ref 5–15)
BUN: 8 mg/dL (ref 8–23)
CO2: 25 mmol/L (ref 22–32)
Calcium: 8.5 mg/dL — ABNORMAL LOW (ref 8.9–10.3)
Chloride: 114 mmol/L — ABNORMAL HIGH (ref 98–111)
Creatinine, Ser: 0.7 mg/dL (ref 0.61–1.24)
GFR, Estimated: >60 mL/min (ref >60)
Glucose, Bld: 79 mg/dL (ref 70–99)
Potassium: 3.5 mmol/L (ref 3.5–5.1)
Sodium: 147 mmol/L — ABNORMAL HIGH (ref 135–145)

## 2020-02-13 LAB — MAGNESIUM: Magnesium: 1.8 mg/dL (ref 1.7–2.4)

## 2020-02-13 LAB — PHOSPHORUS: Phosphorus: 3 mg/dL (ref 2.5–4.6)

## 2020-02-13 NOTE — Progress Notes (Signed)
HR rechecked manually 94BPM. No intervention needed  02/13/20 2033  Assess: MEWS Score  Temp 99.3 F (37.4 C)  BP 120/85  Pulse Rate (!) 114  Resp 20  SpO2 97 %  Assess: MEWS Score  MEWS Temp 0  MEWS Systolic 0  MEWS Pulse 2  MEWS RR 0  MEWS LOC 0  MEWS Score 2  MEWS Score Color Yellow  Assess: if the MEWS score is Yellow or Red  Were vital signs taken at a resting state? Yes  Focused Assessment No change from prior assessment  Early Detection of Sepsis Score *See Row Information* Low  MEWS guidelines implemented *See Row Information* No, vital signs rechecked  Treat  Pain Scale 0-10  Pain Score 0  Escalate  MEWS: Escalate Yellow: discuss with charge nurse/RN and consider discussing with provider and RRT  Notify: Charge Nurse/RN  Name of Charge Nurse/RN Notified Gavin Pound RN  Date Charge Nurse/RN Notified 02/13/20  Time Charge Nurse/RN Notified 2058  Document  Patient Outcome Other (Comment)

## 2020-02-13 NOTE — Progress Notes (Signed)
PROGRESS NOTE    Thomas Coffey   NOM:[id number]  DOB: Jun 07, 1955  PCP: Patient, [name]    DOA: 01/28/2020 LOS: 15   Brief Narrative   Thomas Coffey is a 65 y.o. male with medical history significant of alcohol abuse, tobacco abuse, alcoholic pancreatitis, CAD, myocardial infarction, atrial fibrillation not on anticoagulants, who presented to the ED on 01/28/2020 with 10 days of cough and body aches, in addition to progressive abdominal pain with nausea vomiting since the day before.  Evaluation in the ED revealed acute pancreatitis with lipase 518.  COVID-19 positive.  Liver enzymes elevated.  Chest x-ray showed acute bronchitic changes.  1/19: Made n.p.o. by speech therapy due to significant decline in the cognitive status and unable to stay awake. 1/20: Tapering his Librium 50->20 mg 3 times daily to see if it helps his mental status 1/21: He is more awake today and speech therapy was able to start him on dysphagia 1 diet.  He does cough and likely aspirating silently - his long-term nutrition remains a challenge and concerning for worsening severe malnutrition. taper his Librium from 20->10 mg 3 times daily 1/22: Per nursing report he has been more awake and was able to eat yesterday.  Did require some Ativan this morning for agitation.  We will continue tapering Librium 10->5 mg TID as needed 1/23 -discontinuing Librium and CIWA protocol. trial off any sedation.  Waiting for SNF 1/24: got very agitated per nursing. So CIWA had to be placed back. Nursing also concerned for constant aspiration when tries to feed.   1/27: again made NPO as sitter reports very coarse cough with feeding pt.     Assessment & Plan   Principal Problem:   Acute respiratory disease due to COVID-19 virus Active Problems:   Alcohol abuse   Hypomagnesemia   Acute pancreatitis   Abnormal LFTs   CAD (coronary artery disease)   Atrial fibrillation, chronic (HCC)   Tobacco abuse    COVID-19   Acute metabolic encephalopathy -from alcohol withdrawal  CT head and MRI brain negative for any acute findings  Alcohol abuse / Uncomplicated Alcohol Withdrawal - Son reports pt very heavy drinker, all day every day, both beer and liquor for entire adult life. He was not aware of prior withdrawal or if hx of seizure or DT's --Tapered off Librium, completed on 1/23 --Tried off CIWA and all sedation, but started agitation (kicking, pulling at lines and trying to throw things around the room per nursing) so CIWA resumed on 1/24 with PRN Ativan -- continue gabapentin  Aspiration pneumonia-resolved Dysphagia - pt constantly aspirating and is very high risk for recurrent pneumonia.   Made n.p.o. again 1/27 --Will resume dysphagia 1 diet with honey thick liquids as per SLP recs as patient is more awake and alert. --Advised not to feed patient if he is sedated or not alert enough to swallow safely --Monitor closely with low threshold to return to n.p.o. status --completed 5 days course of Unasyn on 1/23 -- Aspiration precautions --SLP following   Inadequate p.o. intake - seems acute on chronic issue based his malnutrition.  Continue D5-NS for maintenance.  Again made n.p.o. 1/27.  Palliative care following, goals of care discussions ongoing.  Should start tube feeds if continuing full scope of care.  Hypoglycemia -due to poor p.o. intake.  On D5-NS for maintenance.  Hypokalemia  / Hypomagnesemia -present on admission.  Repleted  COVID-19 infection -presented with 10-day history of body aches and cough,  tested positive in the ED.  Has not been hypoxic.  Chest x-ray showed bronchitic changes. --Completed remdesivir, Steroids started on admission were stopped given no hypoxia -- Scheduled Mucinex, as needed albuterol -- Vitamin C, zinc -- Completed 5 day course doxycycline for secondary bronchitis  Acute pancreatitis -seems resolved.  Present on admission, due to alcohol  abuse.   -- Diet as tolerated -- As needed Zofran  Sinus tachycardia - due to Etoh withdrawal most likely.  Now resolved on metoprolol  Hypertension - probably related to EtOH withdrawal. Lopressor given A-fib history and tachycardia.   BP improved.  Monitor.  Tobacco abuse -nicotine patch.  Smoking cessation recommended.  Transaminitis -likely due to alcohol abuse.  Monitor CMP closely since on remdesivir. RUQ ultrasound showed likely hepatic steatosis, gall stones.  CAD -chronic, stable without active chest pain or acute ischemic changes on EKG.    A. fib, chronic - Has been normal sinus on tele,was tachycardic related to alcohol withdrawal, now rate controlled for most part.   -- Not on anticoagulation or rate control meds --Continue Lopressor for rate and BP --off tele    DVT prophylaxis: enoxaparin (LOVENOX) injection 40 mg Start: 01/28/20 2200   Diet:  Diet Orders (From admission, onward)    Start     Ordered   02/12/20 1423  Diet NPO time specified Except for: Sips with Meds  Diet effective now       Question:  Except for  Answer:  Clearance Coots with Meds   02/12/20 1422            Code Status: Full Code    Subjective 02/13/20    Pt awake when seen today, appears drowsy however.  Sitter at bedside.  Sitter reports patient doing well and remaining calm this morning without agitation.  Patient is n.p.o. and is asking for food.  Denies any other acute complaints at this time.  His speech is, as per usual, difficult to understand.   Disposition Plan & Communication   Status is: Inpatient  Remains inpatient appropriate because:Awaiting SNF placement, unsafe discharge, ongoing aspiration, inadequate oral intake   Dispo:  Patient From: Home  Planned Disposition: Skilled Nursing Facility  Expected discharge date: pending placement / GOC  Medically stable for discharge: No      Family Communication: None at bedside will attempt to call son this  afternoon   Consults, Procedures, Significant Events   Consultants:   Palliative care  Procedures:   None  Antimicrobials:  Anti-infectives (From admission, onward)   Start     Dose/Rate Route Frequency Ordered Stop   02/04/20 2200  Ampicillin-Sulbactam (UNASYN) 3 g in sodium chloride 0.9 % 100 mL IVPB        3 g 200 mL/hr over 30 Minutes Intravenous Every 6 hours 02/04/20 1320 02/07/20 2359   02/03/20 1015  amoxicillin-clavulanate (AUGMENTIN) 875-125 MG per tablet 1 tablet  Status:  Discontinued        1 tablet Oral Every 12 hours 02/03/20 0929 02/04/20 1320   01/29/20 1000  azithromycin (ZITHROMAX) tablet 250 mg  Status:  Discontinued       "Followed by" Linked Group Details   250 mg Oral Daily 01/28/20 1010 01/28/20 1025   01/29/20 1000  remdesivir 100 mg in sodium chloride 0.9 % 100 mL IVPB       "Followed by" Linked Group Details   100 mg 200 mL/hr over 30 Minutes Intravenous Daily 01/28/20 1024 02/02/20 0959   01/28/20 1200  remdesivir  200 mg in sodium chloride 0.9% 250 mL IVPB       "Followed by" Linked Group Details   200 mg 580 mL/hr over 30 Minutes Intravenous Once 01/28/20 1024 01/28/20 1318   01/28/20 1030  doxycycline (VIBRA-TABS) tablet 100 mg  Status:  Discontinued        100 mg Oral Every 12 hours 01/28/20 1025 02/01/20 1425   01/28/20 1015  azithromycin (ZITHROMAX) tablet 500 mg  Status:  Discontinued       "Followed by" Linked Group Details   500 mg Oral Daily 01/28/20 1010 01/28/20 1025        Objective   Vitals:   02/13/20 0352 02/13/20 0500 02/13/20 0752 02/13/20 1557  BP: 118/85  (!) 135/91 126/82  Pulse: 96  95 (!) 101  Resp: 18  18 16   Temp: 98.9 F (37.2 C)  98.8 F (37.1 C) 98.6 F (37 C)  TempSrc: Oral  Oral Oral  SpO2: 95%  100% 100%  Weight:  74.8 kg    Height:        Intake/Output Summary (Last 24 hours) at 02/13/2020 1810 Last data filed at 02/13/2020 1239 Gross per 24 hour  Intake --  Output 300 ml  Net -300 ml   Filed  Weights   01/28/20 0532 02/06/20 0500 02/13/20 0500  Weight: 77.1 kg 73 kg 74.8 kg    Physical Exam:  General exam: awake but appears drowsy, no acute distress Respiratory system: CTAB, normal respiratory effort, on room air. Cardiovascular system: RRR, no pedal edema.   Gastrointestinal system: soft, NT, ND Extremities: no edema, normal tone Skin: dry, intact, normal temperature   Labs   Data Reviewed: I have personally reviewed following labs and imaging studies  CBC: Recent Labs  Lab 02/07/20 0625 02/08/20 1017 02/09/20 0810 02/12/20 0552  WBC 11.8* 11.9* 11.0* 8.7  NEUTROABS 9.6* 10.0*  --   --   HGB 15.1 14.3 12.5* 13.0  HCT 44.9 41.7 38.0* 39.0  MCV 93.2 92.5 94.3 95.4  PLT 334 304 332 369   Basic Metabolic Panel: Recent Labs  Lab 02/07/20 0625 02/08/20 1017 02/08/20 1243 02/09/20 0810 02/12/20 0552 02/13/20 0417  NA 147* 145  --  149* 145 147*  K 3.9 3.2*  --  2.8* 3.1* 3.5  CL 111 111  --  116* 112* 114*  CO2 24 23  --  25 24 25   GLUCOSE 90 135*  --  121* 72 79  BUN <5* <5*  --  <5* <5* 8  CREATININE 0.49* 0.59*  --  0.63 0.56* 0.70  CALCIUM 9.2 8.5*  --  8.1* 8.2* 8.5*  MG  --   --  1.7 1.6* 1.8 1.8  PHOS  --   --   --  3.4  --  3.0   GFR: Estimated Creatinine Clearance: 93.3 mL/min (by C-G formula based on SCr of 0.7 mg/dL). Liver Function Tests: Recent Labs  Lab 02/07/20 0625 02/08/20 1017 02/09/20 0810 02/12/20 0552  AST 119* 102* 83* 78*  ALT 58* 53* 43 37  ALKPHOS 155* 138* 128* 142*  BILITOT 1.6* 1.2 0.9 0.8  PROT 7.2 6.1* 5.3* 5.6*  ALBUMIN 2.5* 2.0* 1.8* 1.9*   No results for input(s): LIPASE, AMYLASE in the last 168 hours. No results for input(s): AMMONIA in the last 168 hours. Coagulation Profile: No results for input(s): INR, PROTIME in the last 168 hours. Cardiac Enzymes: No results for input(s): CKTOTAL, CKMB, CKMBINDEX, TROPONINI in the last 168 hours.  BNP (last 3 results) No results for input(s): PROBNP in the last  8760 hours. HbA1C: No results for input(s): HGBA1C in the last 72 hours. CBG: No results for input(s): GLUCAP in the last 168 hours. Lipid Profile: No results for input(s): CHOL, HDL, LDLCALC, TRIG, CHOLHDL, LDLDIRECT in the last 72 hours. Thyroid Function Tests: No results for input(s): TSH, T4TOTAL, FREET4, T3FREE, THYROIDAB in the last 72 hours. Anemia Panel: No results for input(s): VITAMINB12, FOLATE, FERRITIN, TIBC, IRON, RETICCTPCT in the last 72 hours. Sepsis Labs: No results for input(s): PROCALCITON, LATICACIDVEN in the last 168 hours.  No results found for this or any previous visit (from the past 240 hour(s)).    Imaging Studies   No results found.   Medications   Scheduled Meds: . amLODipine  5 mg Oral Daily  . vitamin C  500 mg Oral Daily  . aspirin EC  81 mg Oral Daily  . dextromethorphan-guaiFENesin  1 tablet Oral BID  . enoxaparin (LOVENOX) injection  40 mg Subcutaneous Q24H  . folic acid  1 mg Oral Daily  . gabapentin  300 mg Oral QHS  . magnesium oxide  400 mg Oral BID  . metoprolol tartrate  25 mg Oral BID  . multivitamin with minerals  1 tablet Oral Daily  . nicotine  21 mg Transdermal Daily  . senna-docusate  1 tablet Oral QHS  . thiamine injection  100 mg Intravenous Daily  . zinc sulfate  220 mg Oral Daily   Continuous Infusions: . dextrose 5 % and 0.9% NaCl 75 mL/hr at 02/10/20 0213       LOS: 15 days    Time spent: 25 minutes with greater than 50% spent at bedside and in coordination of care.    Pennie Banter, DO [facility]  02/13/2020, 6:10 PM    If 7PM-7AM, please contact night-coverage. How to contact the [facility] Attending or Consulting provider 7A - 7P or covering provider during after hours 7P -7A, for this patient?    1. Check the care team in [facility] and look for a) attending/consulting TR[facility] provider listed and b) the [facility] team listed 2. Log into [email or web address] and use [facility]'s universal password to access. If you do  not have the password, please contact the hospital operator. 3. Locate the [facility] provider you are looking for under [facility] and page to a number that you can be directly reached. 4. If you still have difficulty reaching the provider, please page the [facility] (Director on Call) for the Hospitalists listed on amion for assistance.

## 2020-02-13 NOTE — Progress Notes (Signed)
Physical Therapy Treatment Patient Details Name: Thomas Coffey MRN: [id number] DOB: [birth year] Today's Date: 02/13/2020    History of Present Illness Thomas Coffey is a 65 y.o. male with medical history significant of alcohol abuse, tobacco abuse, alcoholic pancreatitis, CAD, myocardial infarction, atrial fibrillation not on anticoagulants, who presented to the ED on 01/28/2020 with 10 days of cough and body aches, in addition to progressive abdominal pain with nausea vomiting since the day before.     Evaluation in the ED revealed acute pancreatitis with lipase 518.  COVID-19 positive.  Liver enzymes elevated.  Chest x-ray showed acute bronchitic changes.Thomas Coffey is a 65 y.o. male with medical history significant of alcohol abuse, tobacco abuse, alcoholic pancreatitis, CAD, myocardial infarction, atrial fibrillation not on anticoagulants, who presented to the ED on 01/28/2020 with 10 days of cough and body aches, in addition to progressive abdominal pain with nausea vomiting since the day before. Evaluation in the ED revealed acute pancreatitis with lipase 518.  COVID-19 positive.  Liver enzymes elevated.  Chest x-ray showed acute bronchitic changes.    PT Comments    Patient received in bed, calm, sitter present. He is agreeable to getting up to recliner. He mumbles at times. Mod independent with bed mobility, transfers with min assist. He has difficulty advancing B LEs for ambulation and required mod +2 assist to get from bed to recliner. Patient will continue to benefit from skilled PT while here to improve functional independence and safety with mobility.      Follow Up Recommendations  SNF     Equipment Recommendations  None recommended by PT;Other (comment) (TBD)    Recommendations for Other Services       Precautions / Restrictions Precautions Precautions: Fall Restrictions Weight Bearing Restrictions: No    Mobility  Bed Mobility Overal bed mobility: Modified  Independent Bed Mobility: Supine to Sit     Supine to sit: Modified independent (Device/Increase time)     General bed mobility comments: patient mod independent with bed mobility. Sitter reports she has tried to get him up to recliner multiple times today. He would not, but is now agreeable.  Transfers Overall transfer level: Needs assistance Equipment used: 2 person hand held assist Transfers: Sit to/from Stand Sit to Stand: Min assist;Mod assist         General transfer comment: Patient requires min assist to stand.  Ambulation/Gait Ambulation/Gait assistance: Mod assist;+2 physical assistance Gait Distance (Feet): 4 Feet Assistive device: 2 person hand held assist Gait Pattern/deviations: Decreased step length - right;Decreased step length - left;Shuffle Gait velocity: decreased   General Gait Details: Patient demonstrates difficulty moving feet. Does the motion of moving his feet, but does not actually move anywhere. Requires +2 assist to get to recliner safely. Sits down prematurely.   Stairs             Wheelchair Mobility    Modified Rankin (Stroke Patients Only)       Balance Overall balance assessment: Needs assistance Sitting-balance support: Feet supported Sitting balance-Leahy Scale: Good Sitting balance - Comments: supervision sitting EOB   Standing balance support: Bilateral upper extremity supported;During functional activity Standing balance-Leahy Scale: Poor Standing balance comment: requires B UE support, min/mod assist to move feet.                            Cognition Arousal/Alertness: Awake/alert Behavior During Therapy: Flat affect Overall Cognitive Status: Impaired/Different from baseline Area of Impairment:  Orientation;Memory;Attention;Safety/judgement;Following commands;Problem solving;Awareness                 Orientation Level: Disoriented to;Place;Time;Situation   Memory: Decreased short-term  memory;Decreased recall of precautions Following Commands: Follows one step commands inconsistently;Follows one step commands with increased time Safety/Judgement: Decreased awareness of deficits;Decreased awareness of safety Awareness: Intellectual Problem Solving: Decreased initiation;Requires tactile cues;Requires verbal cues;Slow processing;Difficulty sequencing General Comments: Patient more appropriate this date. Mumbles at times, but otherwise able to understand him. States he's hungry      Exercises Other Exercises Other Exercises: B seated LAQ, marching x 10 reps each    General Comments        Pertinent Vitals/Pain Faces Pain Scale: No hurt    Home Living                      Prior Function            PT Goals (current goals can now be found in the care plan section) Acute Rehab PT Goals Patient Stated Goal: " to eat" PT Goal Formulation: With patient Time For Goal Achievement: 02/25/20 Potential to Achieve Goals: Fair Progress towards PT goals: Progressing toward goals    Frequency    Min 2X/week      PT Plan Current plan remains appropriate    Co-evaluation              AM-PAC PT "6 Clicks" Mobility   Outcome Measure  Help needed turning from your back to your side while in a flat bed without using bedrails?: A Little Help needed moving from lying on your back to sitting on the side of a flat bed without using bedrails?: A Little Help needed moving to and from a bed to a chair (including a wheelchair)?: A Lot Help needed standing up from a chair using your arms (e.g., wheelchair or bedside chair)?: A Little Help needed to walk in hospital room?: A Lot Help needed climbing 3-5 steps with a railing? : Total 6 Click Score: 14    End of Session Equipment Utilized During Treatment: Gait belt Activity Tolerance: Patient tolerated treatment well Patient left: with call bell/phone within reach;in chair;with nursing/sitter in room Nurse  Communication: Mobility status PT Visit Diagnosis: Unsteadiness on feet (R26.81);Other abnormalities of gait and mobility (R26.89);Muscle weakness (generalized) (M62.81);Difficulty in walking, not elsewhere classified (R26.2)     Time: 0947-0962 PT Time Calculation (min) (ACUTE ONLY): 13 min  Charges:  $Therapeutic Activity: 8-22 mins                     [employer], PT, GCS 02/13/20,4:03 PM

## 2020-02-14 DIAGNOSIS — I482 Chronic atrial fibrillation, unspecified: Secondary | ICD-10-CM | POA: Diagnosis not present

## 2020-02-14 DIAGNOSIS — F1010 Alcohol abuse, uncomplicated: Secondary | ICD-10-CM | POA: Diagnosis not present

## 2020-02-14 DIAGNOSIS — U071 COVID-19: Secondary | ICD-10-CM | POA: Diagnosis not present

## 2020-02-14 DIAGNOSIS — R627 Adult failure to thrive: Secondary | ICD-10-CM

## 2020-02-14 LAB — BASIC METABOLIC PANEL
Anion gap: 7 (ref 5–15)
BUN: 10 mg/dL (ref 8–23)
CO2: 26 mmol/L (ref 22–32)
Calcium: 8.6 mg/dL — ABNORMAL LOW (ref 8.9–10.3)
Chloride: 115 mmol/L — ABNORMAL HIGH (ref 98–111)
Creatinine, Ser: 0.72 mg/dL (ref 0.61–1.24)
GFR, Estimated: >60 mL/min (ref >60)
Glucose, Bld: 104 mg/dL — ABNORMAL HIGH (ref 70–99)
Potassium: 3.7 mmol/L (ref 3.5–5.1)
Sodium: 148 mmol/L — ABNORMAL HIGH (ref 135–145)

## 2020-02-14 LAB — PHOSPHORUS: Phosphorus: 3.6 mg/dL (ref 2.5–4.6)

## 2020-02-14 LAB — MAGNESIUM: Magnesium: 1.9 mg/dL (ref 1.7–2.4)

## 2020-02-14 MED ORDER — DEXTROSE 5 % IV SOLN: INTRAVENOUS | Status: DC

## 2020-02-14 NOTE — Progress Notes (Signed)
  Speech Language Pathology Treatment: Dysphagia  Patient Details Name: Thomas Coffey MRN: [id number] DOB: 06-17-1955 Today's Date: 02/14/2020 Time: 1340-1400 SLP Time Calculation (min) (ACUTE ONLY): 20 min  Assessment / Plan / Recommendation Clinical Impression  Patient presenting reclined in bed with sitter at bedside, awoke upon SLP arrival. Per RN report, patient with poor PO intake today with family discussion regarding PEG vs. NGT for alternative means of nutrition/hydration. Per sitter report, patient did consume 25-50% of breakfast and lunch today with minimal s/s of aspiration (x1 cough following sip of liquids). During session, patient lethargic, however agreeable to taking sips of HTL from lunch tray given max encouragement. Following x2 cup sips of HTL, patient demonstrated delayed coughing episodes. D/t medical history along with s/s of pharyngeal dysphagia during today's treatment session, evaluating SLP to consider an objective swallowing study via MBSS (if patient able/awake/participatory) to r/o pharyngeal dysphagia and determine appropriate oral diet recommendation. Education provided to sitter re: recommended aspiration precautions to assist with patient during meals - She verbalized understanding and agreement to education. Left patient slightly reclined in bed with call bell in reach and all wants/needs met prior to exiting room. Continue POC.         SLP Plan  Continue with current plan of care       Recommendations  Diet recommendations: Dysphagia 1 (puree);Honey-thick liquid Medication Administration: Other (Comment) (per MD/RN discretion) Supervision: Full supervision/cueing for compensatory strategies Compensations: Minimize environmental distractions;Slow rate;Small sips/bites Postural Changes and/or Swallow Maneuvers: Seated upright 90 degrees                Oral Care Recommendations: Oral care before and after PO Follow up Recommendations: Other (comment)  (TBD) SLP Visit Diagnosis: Dysphagia, unspecified (R13.10) Plan: Continue with current plan of care       [place], M.S. CCC-SLP Speech-Language Pathologist  Loni Beckwith 02/14/2020, 2:26 PM

## 2020-02-14 NOTE — Consult Note (Signed)
Consultation  Referring Provider:    Dr. Denton Lank Admit date: 01/28/2020 Consult date: 02/14/2020         Reason for Consultation:   PEG tube         HPI:   Thomas Coffey is a 65 y.o. gentleman with significant medical history of alcohol abuse, tobacco abuse, pancreatitis, CAD, and a. Fib who presented earlier this month with pancreatitis, alcohol withdrawal, and covid infection. He has had waxing and waning mental status and has got weak. He has been having difficult with oropharyngeal dysphagia and has not been receiving much nutrition. NG tube has not yet been tried. Palliative has also been seeing the patient. He has been receiving benzos for agitation and when I went to see him this morning he was asleep but per sitter, he had eaten some food with only mild difficulty. Per review of his chart there is mention of right hemicolectomy but evidence of cirrhosis on imaging.    Past Medical History:  Diagnosis Date  . MI (myocardial infarction) (HCC)     History reviewed. No pertinent surgical history.  Family History  Problem Relation Age of Onset  . Cancer Mother   . Kidney disease Brother     Social History   Tobacco Use  . Smoking status: Current Every Day Smoker    Packs/day: 1.00    Years: 30.00    Pack years: 30.00    Types: Cigarettes  . Smokeless tobacco: Never Used  Substance Use Topics  . Alcohol use: Yes    Alcohol/week: 6.0 standard drinks    Types: 6 Cans of beer per week    Comment: daily  . Drug use: Yes    Types: Marijuana    Prior to Admission medications   Medication Sig Start Date End Date Taking? Authorizing Provider  acetaminophen (TYLENOL) 325 MG tablet Take 2 tablets (650 mg total) by mouth every 6 (six) hours as needed for mild pain or fever. 11/25/18  Yes Lorretta Harp, MD  albuterol (VENTOLIN HFA) 108 (90 Base) MCG/ACT inhaler Inhale 2 puffs into the lungs every 4 (four) hours as needed for wheezing or shortness of breath. 01/22/20  Yes Dionne Bucy, MD  aspirin EC 81 MG EC tablet Take 1 tablet (81 mg total) by mouth daily. 11/26/18  Yes Lorretta Harp, MD  potassium chloride (KLOR-CON) 10 MEQ tablet Take 1 tablet (10 mEq total) by mouth 2 (two) times daily for 7 days. 01/22/20 01/29/20 Yes Dionne Bucy, MD    Current Facility-Administered Medications  Medication Dose Route Frequency Provider Last Rate Last Admin  . acetaminophen (TYLENOL) tablet 650 mg  650 mg Oral Q6H PRN Delfino Lovett, MD      . albuterol (VENTOLIN HFA) 108 (90 Base) MCG/ACT inhaler 2 puff  2 puff Inhalation Q4H PRN Lorretta Harp, MD      . amLODipine (NORVASC) tablet 5 mg  5 mg Oral Daily Esaw Grandchild A, DO   5 mg at 02/14/20 0904  . ascorbic acid (VITAMIN C) tablet 500 mg  500 mg Oral Daily Lorretta Harp, MD   500 mg at 02/14/20 0902  . aspirin EC tablet 81 mg  81 mg Oral Daily Lorretta Harp, MD   81 mg at 02/14/20 0902  . bisacodyl (DULCOLAX) EC tablet 5 mg  5 mg Oral Daily PRN Esaw Grandchild A, DO      . dextromethorphan-guaiFENesin (MUCINEX DM) 30-600 MG per 12 hr tablet 1 tablet  1 tablet Oral BID Pennie Banter,  DO   1 tablet at 02/14/20 0903  . dextrose 5 % solution   Intravenous Continuous Esaw Grandchild A, DO   Stopped at 02/14/20 1015  . enoxaparin (LOVENOX) injection 40 mg  40 mg Subcutaneous Q24H Lorretta Harp, MD   40 mg at 02/13/20 2101  . folic acid (FOLVITE) tablet 1 mg  1 mg Oral Daily Gardner Candle, RPH   1 mg at 02/14/20 9675  . gabapentin (NEURONTIN) tablet 300 mg  300 mg Oral QHS Delfino Lovett, MD   300 mg at 02/13/20 2106  . ipratropium (ATROVENT HFA) inhaler 2 puff  2 puff Inhalation Q6H PRN Esaw Grandchild A, DO      . magnesium oxide (MAG-OX) tablet 400 mg  400 mg Oral BID Esaw Grandchild A, DO   400 mg at 02/14/20 9163  . metoprolol tartrate (LOPRESSOR) tablet 25 mg  25 mg Oral BID Esaw Grandchild A, DO   25 mg at 02/13/20 2100  . multivitamin with minerals tablet 1 tablet  1 tablet Oral Daily Lorretta Harp, MD   1 tablet at 02/14/20 0902   . nicotine (NICODERM CQ - dosed in mg/24 hours) patch 21 mg  21 mg Transdermal Daily Lorretta Harp, MD   21 mg at 02/14/20 0903  . ondansetron (ZOFRAN) injection 4 mg  4 mg Intravenous Q8H PRN Lorretta Harp, MD      . senna-docusate (Senokot-S) tablet 1 tablet  1 tablet Oral QHS Esaw Grandchild A, DO   1 tablet at 02/13/20 2100  . thiamine (B-1) injection 100 mg  100 mg Intravenous Daily Delfino Lovett, MD   100 mg at 02/14/20 0902  . zinc sulfate capsule 220 mg  220 mg Oral Daily Lorretta Harp, MD   220 mg at 02/14/20 0902    Allergies as of 01/28/2020  . (No Known Allergies)     Review of Systems:     Unable to assess due to altered mental status   Physical Exam:  Vital signs in last 24 hours: Temp:  [98.3 F (36.8 C)-99.3 F (37.4 C)] 98.3 F (36.8 C) (01/29 0943) Pulse Rate:  [88-114] 103 (01/29 0943) Resp:  [16-20] 20 (01/29 0943) BP: (116-126)/(75-85) 125/75 (01/29 0943) SpO2:  [97 %-100 %] 97 % (01/29 0943) Last BM Date: 02/11/20   General:   Asleep Head:  Normocephalic and atraumatic. Ears:  No deformity Neck:  Supple; no masses felt Lungs:  No respiratory distress Abdomen:   Flat, soft, nondistended, nontender. No evidence of significant abdominal scars. Msk:  No clubbing or cyanosis.  Symmetrical without gross deformities. Neurologic:  Unable to assess Skin:  Warm, dry, pink without significant lesions or rashes.  LAB RESULTS: Recent Labs    02/12/20 0552  WBC 8.7  HGB 13.0  HCT 39.0  PLT 369   BMET Recent Labs    02/12/20 0552 02/13/20 0417 02/14/20 0443  NA 145 147* 148*  K 3.1* 3.5 3.7  CL 112* 114* 115*  CO2 24 25 26   GLUCOSE 72 79 104*  BUN <5* 8 10  CREATININE 0.56* 0.70 0.72  CALCIUM 8.2* 8.5* 8.6*   LFT Recent Labs    02/12/20 0552  PROT 5.6*  ALBUMIN 1.9*  AST 78*  ALT 37  ALKPHOS 142*  BILITOT 0.8   PT/INR No results for input(s): LABPROT, INR in the last 72 hours.  STUDIES: No results found.     Impression / Plan:   65 y/o  gentleman with who presented with pancreatitis, alcohol  withdrawal, and COVID infection who has had prolonged hospitalization. We were consulted for PEG tube placement for nutrition however no firm plans made by family as of now. Do at least recommend trial of NG tube with bridle for nutrition in the interim as he has been without food for a significant period of time.  - recommend fecal elastase - recommend checking vitamin A, E, D, and PT/INR - continue thiamine - continue speech therapy - will follow along  Please call with any questions or concerns  Merlyn Lot MD, MPH [facility]

## 2020-02-14 NOTE — Progress Notes (Addendum)
PROGRESS NOTE    Thomas Coffey   QAS:[id number]  DOB: 1955-07-31  PCP: Patient, [name]    DOA: 01/28/2020 LOS: 16   Brief Narrative   Thomas Coffey is a 65 y.o. male with medical history significant of alcohol abuse, tobacco abuse, alcoholic pancreatitis, CAD, myocardial infarction, atrial fibrillation not on anticoagulants, who presented to the ED on 01/28/2020 with 10 days of cough and body aches, in addition to progressive abdominal pain with nausea vomiting since the day before.  Evaluation in the ED revealed acute pancreatitis with lipase 518.  COVID-19 positive.  Liver enzymes elevated.  Chest x-ray showed acute bronchitic changes.  1/19: Made n.p.o. by speech therapy due to significant decline in the cognitive status and unable to stay awake. 1/20: Tapering his Librium 50->20 mg 3 times daily to see if it helps his mental status 1/21: He is more awake today and speech therapy was able to start him on dysphagia 1 diet.  He does cough and likely aspirating silently - his long-term nutrition remains a challenge and concerning for worsening severe malnutrition. taper his Librium from 20->10 mg 3 times daily 1/22: Per nursing report he has been more awake and was able to eat yesterday.  Did require some Ativan this morning for agitation.  We will continue tapering Librium 10->5 mg TID as needed 1/23 -discontinuing Librium and CIWA protocol. trial off any sedation.  Waiting for SNF 1/24: got very agitated per nursing. So CIWA had to be placed back. Nursing also concerned for constant aspiration when tries to feed.   1/27: again made NPO as sitter reports very coarse cough with feeding pt.     Assessment & Plan   Principal Problem:   Acute respiratory disease due to COVID-19 virus Active Problems:   Alcohol abuse   Hypomagnesemia   Acute pancreatitis   Abnormal LFTs   CAD (coronary artery disease)   Atrial fibrillation, chronic (HCC)   Tobacco abuse   COVID-19    Failure to thrive in adult   Acute metabolic encephalopathy -from alcohol withdrawal  CT head and MRI brain negative for any acute findings. Appears consistent with hypoactive delirium.  Pt has not received Ativan or other significant sedating meds for past few days according to med history.   --Ativan discontinued --Stop bedtime gabapentin --Avoid sedating meds as much as possible ----Delirium precautions:     -Lights and TV off, minimize interruptions at night    -Blinds open and lights on during day    -Glasses/hearing aid with patient    -Frequent reorientation    -PT/OT when able    --up to chair, mobilize as much as possible  Alcohol abuse / Uncomplicated Alcohol Withdrawal - Son reports pt very heavy drinker, all day every day, both beer and liquor for entire adult life. He was not aware of prior withdrawal or if hx of seizure or DT's --Tapered off Librium, completed on 1/23 --Tried off CIWA and all sedation, but started agitation (kicking, pulling at lines and trying to throw things around the room per nursing) so CIWA resumed on 1/24 with PRN Ativan -- Off CIWA again since 1/27 -- stopped gabapentin  Aspiration pneumonia-resolved Dysphagia - has been resumed on dysphagia 1 / pureed diet honey thick liquids, doing fairly well.  He is recurrently aspirating and is very high risk for recurrent pneumonia.   Made n.p.o. again 1/27, resumed diet 1/28 PM --Only feed patient when alert, awake and upright --Do not feed patient  if he is sedated/lethargic --Monitor closely with low threshold to return to n.p.o. status --completed 5 days course of Unasyn on 1/23 -- Aspiration precautions --SLP following, plan for MBSS  Inadequate p.o. intake - seems acute on chronic issue based his malnutrition.  Continue D5-NS for maintenance.  Again made n.p.o. 1/27.  Palliative care following, goals of care discussions ongoing.  Should start tube feeds if continuing full scope of care.  Family  requesting PEG tube.   GI consulted. Soonest will be Monday for procedure.  Hypoglycemia -due to poor p.o. intake.  On D5W.  Hypokalemia  / Hypomagnesemia / Hypophosphatemia Electrolytes replaced.  Follow labs.  COVID-19 infection -presented with 10-day history of body aches and cough, tested positive in the ED.  Has not been hypoxic.  Chest x-ray showed bronchitic changes. --Completed remdesivir, Steroids started on admission were stopped given no hypoxia -- Scheduled Mucinex, as needed albuterol -- Vitamin C, zinc -- Completed 5 day course doxycycline for secondary bronchitis  Acute pancreatitis -seems resolved.  Present on admission, due to alcohol abuse.   -- Diet as tolerated -- As needed Zofran  Sinus tachycardia - due to Etoh withdrawal most likely.  Now resolved on metoprolol  Hypertension - Improved.  Normotensive recently. Was likely due to EtOH withdrawal.  Lopressor started A-fib history and tachycardia.    BP improved.  Monitor.  Tobacco abuse -nicotine patch.  Smoking cessation recommended.  Transaminitis -likely due to alcohol abuse.  Monitor CMP closely since on remdesivir. RUQ ultrasound showed likely hepatic steatosis, gall stones.  CAD -chronic, stable without active chest pain or acute ischemic changes on EKG.    A. fib, chronic - Has been normal sinus on tele,was tachycardic related to alcohol withdrawal, now rate controlled for most part.   -- Not on anticoagulation or rate control meds --Continue Lopressor for rate and BP --off tele    DVT prophylaxis: enoxaparin (LOVENOX) injection 40 mg Start: 01/28/20 2200   Diet:  Diet Orders (From admission, onward)    Start     Ordered   02/13/20 1813  DIET - DYS 1 Room service appropriate? Yes; Fluid consistency: Honey Thick  Diet effective now       Question Answer Comment  Room service appropriate? Yes   Fluid consistency: Honey Thick      02/13/20 1812            Code Status: Full Code     Subjective 02/14/20    Pt asleep with blanket over head. Sitter at bedside.  I removed the blanket, he woke up but was lethargic.  Sitter reported he's done well on puree diet.  His speech is too soft and mumbling as usual.   Disposition Plan & Communication   Status is: Inpatient  Remains inpatient appropriate because:Awaiting SNF placement, unsafe discharge, ongoing aspiration, inadequate oral intake.  Persistent encephalopathy.   Dispo:  Patient From: Home  Planned Disposition: Skilled Nursing Facility  Expected discharge date: pending placement / GOC  Medically stable for discharge: No      Family Communication: None at bedside will attempt to call son this afternoon   Consults, Procedures, Significant Events   Consultants:   Palliative care  Procedures:   None  Antimicrobials:  Anti-infectives (From admission, onward)   Start     Dose/Rate Route Frequency Ordered Stop   02/04/20 2200  Ampicillin-Sulbactam (UNASYN) 3 g in sodium chloride 0.9 % 100 mL IVPB        3 g 200 mL/hr  over 30 Minutes Intravenous Every 6 hours 02/04/20 1320 02/07/20 2359   02/03/20 1015  amoxicillin-clavulanate (AUGMENTIN) 875-125 MG per tablet 1 tablet  Status:  Discontinued        1 tablet Oral Every 12 hours 02/03/20 0929 02/04/20 1320   01/29/20 1000  azithromycin (ZITHROMAX) tablet 250 mg  Status:  Discontinued       "Followed by" Linked Group Details   250 mg Oral Daily 01/28/20 1010 01/28/20 1025   01/29/20 1000  remdesivir 100 mg in sodium chloride 0.9 % 100 mL IVPB       "Followed by" Linked Group Details   100 mg 200 mL/hr over 30 Minutes Intravenous Daily 01/28/20 1024 02/02/20 0959   01/28/20 1200  remdesivir 200 mg in sodium chloride 0.9% 250 mL IVPB       "Followed by" Linked Group Details   200 mg 580 mL/hr over 30 Minutes Intravenous Once 01/28/20 1024 01/28/20 1318   01/28/20 1030  doxycycline (VIBRA-TABS) tablet 100 mg  Status:  Discontinued        100 mg Oral  Every 12 hours 01/28/20 1025 02/01/20 1425   01/28/20 1015  azithromycin (ZITHROMAX) tablet 500 mg  Status:  Discontinued       "Followed by" Linked Group Details   500 mg Oral Daily 01/28/20 1010 01/28/20 1025        Objective   Vitals:   02/13/20 2036 02/13/20 2350 02/14/20 0943 02/14/20 1655  BP:  116/78 125/75 127/85  Pulse: 94 88 (!) 103 (!) 102  Resp:  20 20 18   Temp:  99 F (37.2 C) 98.3 F (36.8 C) 98.7 F (37.1 C)  TempSrc:  Oral    SpO2:  98% 97% 100%  Weight:      Height:        Intake/Output Summary (Last 24 hours) at 02/14/2020 1801 Last data filed at 02/14/2020 1500 Gross per 24 hour  Intake 620.86 ml  Output 500 ml  Net 120.86 ml   Filed Weights   01/28/20 0532 02/06/20 0500 02/13/20 0500  Weight: 77.1 kg 73 kg 74.8 kg    Physical Exam:  General exam: sleeping with blanket over head, NAD Respiratory system: symmetric chest rist, normal respiratory effort, on room air. Cardiovascular system: RRR, no pedal edema, 2+radial pulses.   Neurologic: lethargic, soft speech/mumbles Gastrointestinal system: soft, NT, ND Extremities: no edema, normal tone Skin: dry, intact, normal temperature   Labs   Data Reviewed: I have personally reviewed following labs and imaging studies  CBC: Recent Labs  Lab 02/08/20 1017 02/09/20 0810 02/12/20 0552  WBC 11.9* 11.0* 8.7  NEUTROABS 10.0*  --   --   HGB 14.3 12.5* 13.0  HCT 41.7 38.0* 39.0  MCV 92.5 94.3 95.4  PLT 304 332 369   Basic Metabolic Panel: Recent Labs  Lab 02/08/20 1017 02/08/20 1243 02/09/20 0810 02/12/20 0552 02/13/20 0417 02/14/20 0443  NA 145  --  149* 145 147* 148*  K 3.2*  --  2.8* 3.1* 3.5 3.7  CL 111  --  116* 112* 114* 115*  CO2 23  --  25 24 25 26   GLUCOSE 135*  --  121* 72 79 104*  BUN <5*  --  <5* <5* 8 10  CREATININE 0.59*  --  0.63 0.56* 0.70 0.72  CALCIUM 8.5*  --  8.1* 8.2* 8.5* 8.6*  MG  --  1.7 1.6* 1.8 1.8 1.9  PHOS  --   --  3.4  --  3.0 3.6   GFR: Estimated  Creatinine Clearance: 93.3 mL/min (by C-G formula based on SCr of 0.72 mg/dL). Liver Function Tests: Recent Labs  Lab 02/08/20 1017 02/09/20 0810 02/12/20 0552  AST 102* 83* 78*  ALT 53* 43 37  ALKPHOS 138* 128* 142*  BILITOT 1.2 0.9 0.8  PROT 6.1* 5.3* 5.6*  ALBUMIN 2.0* 1.8* 1.9*   No results for input(s): LIPASE, AMYLASE in the last 168 hours. No results for input(s): AMMONIA in the last 168 hours. Coagulation Profile: No results for input(s): INR, PROTIME in the last 168 hours. Cardiac Enzymes: No results for input(s): CKTOTAL, CKMB, CKMBINDEX, TROPONINI in the last 168 hours. BNP (last 3 results) No results for input(s): PROBNP in the last 8760 hours. HbA1C: No results for input(s): HGBA1C in the last 72 hours. CBG: No results for input(s): GLUCAP in the last 168 hours. Lipid Profile: No results for input(s): CHOL, HDL, LDLCALC, TRIG, CHOLHDL, LDLDIRECT in the last 72 hours. Thyroid Function Tests: No results for input(s): TSH, T4TOTAL, FREET4, T3FREE, THYROIDAB in the last 72 hours. Anemia Panel: No results for input(s): VITAMINB12, FOLATE, FERRITIN, TIBC, IRON, RETICCTPCT in the last 72 hours. Sepsis Labs: No results for input(s): PROCALCITON, LATICACIDVEN in the last 168 hours.  No results found for this or any previous visit (from the past 240 hour(s)).    Imaging Studies   No results found.   Medications   Scheduled Meds: . amLODipine  5 mg Oral Daily  . vitamin C  500 mg Oral Daily  . aspirin EC  81 mg Oral Daily  . dextromethorphan-guaiFENesin  1 tablet Oral BID  . enoxaparin (LOVENOX) injection  40 mg Subcutaneous Q24H  . folic acid  1 mg Oral Daily  . gabapentin  300 mg Oral QHS  . magnesium oxide  400 mg Oral BID  . metoprolol tartrate  25 mg Oral BID  . multivitamin with minerals  1 tablet Oral Daily  . nicotine  21 mg Transdermal Daily  . senna-docusate  1 tablet Oral QHS  . thiamine injection  100 mg Intravenous Daily  . zinc sulfate  220  mg Oral Daily   Continuous Infusions: . dextrose Stopped (02/14/20 1015)       LOS: 16 days    Time spent: 25 minutes with greater than 50% spent at bedside and in coordination of care.    Pennie Banter, DO [facility]  02/14/2020, 6:01 PM    If 7PM-7AM, please contact night-coverage. How to contact the [facility] Attending or Consulting provider 7A - 7P or covering provider during after hours 7P -7A, for this patient?    1. Check the care team in [facility] and look for a) attending/consulting [facility] provider listed and b) the [facility] team listed 2. Log into [email or web address] and use [place]'s universal password to access. If you do not have the password, please contact the hospital operator. 3. Locate the [facility] provider you are looking for under [facility] and page to a number that you can be directly reached. 4. If you still have difficulty reaching the provider, please page the [facility] (Director on Call) for the Hospitalists listed on amion for assistance.

## 2020-02-15 DIAGNOSIS — J069 Acute upper respiratory infection, unspecified: Secondary | ICD-10-CM | POA: Diagnosis not present

## 2020-02-15 DIAGNOSIS — U071 COVID-19: Secondary | ICD-10-CM | POA: Diagnosis not present

## 2020-02-15 DIAGNOSIS — R627 Adult failure to thrive: Secondary | ICD-10-CM | POA: Diagnosis not present

## 2020-02-15 DIAGNOSIS — F1010 Alcohol abuse, uncomplicated: Secondary | ICD-10-CM | POA: Diagnosis not present

## 2020-02-15 LAB — PHOSPHORUS: Phosphorus: 3.1 mg/dL (ref 2.5–4.6)

## 2020-02-15 LAB — PROTIME-INR
INR: 1.2 (ref 0.8–1.2)
Prothrombin Time: 15 seconds (ref 11.4–15.2)

## 2020-02-15 LAB — BASIC METABOLIC PANEL
Anion gap: 11 (ref 5–15)
BUN: 9 mg/dL (ref 8–23)
CO2: 23 mmol/L (ref 22–32)
Calcium: 8.8 mg/dL — ABNORMAL LOW (ref 8.9–10.3)
Chloride: 108 mmol/L (ref 98–111)
Creatinine, Ser: 0.65 mg/dL (ref 0.61–1.24)
GFR, Estimated: >60 mL/min (ref >60)
Glucose, Bld: 105 mg/dL — ABNORMAL HIGH (ref 70–99)
Potassium: 3.5 mmol/L (ref 3.5–5.1)
Sodium: 142 mmol/L (ref 135–145)

## 2020-02-15 LAB — VITAMIN D 25 HYDROXY (VIT D DEFICIENCY, FRACTURES): Vit D, 25-Hydroxy: 18.25 ng/mL — ABNORMAL LOW (ref 30–100)

## 2020-02-15 LAB — MAGNESIUM: Magnesium: 1.9 mg/dL (ref 1.7–2.4)

## 2020-02-15 MED ORDER — HYDROXYZINE HCL 25 MG PO TABS
25.0000 mg | ORAL_TABLET | Freq: Three times a day (TID) | ORAL | Status: DC | PRN
  Administered 2020-02-16: 25 mg via ORAL
  Filled 2020-02-15 (×2): qty 1

## 2020-02-15 MED ORDER — POTASSIUM CHLORIDE 20 MEQ PO PACK
20.0000 meq | PACK | Freq: Every day | ORAL | Status: DC
  Administered 2020-02-15 – 2020-02-26 (×12): 20 meq via ORAL
  Filled 2020-02-15 (×12): qty 1

## 2020-02-15 NOTE — Progress Notes (Signed)
Patient refused lab to draw blood this am.

## 2020-02-15 NOTE — Progress Notes (Signed)
PROGRESS NOTE    GOEFFREY BERCHTOLD   SHF:[id number]  DOB: 15-Nov-1955  PCP: Patient, [name]    DOA: 01/28/2020 LOS: 17   Brief Narrative   Thomas Coffey is a 65 y.o. male with medical history significant of alcohol abuse, tobacco abuse, alcoholic pancreatitis, CAD, myocardial infarction, atrial fibrillation not on anticoagulants, who presented to the ED on 01/28/2020 with 10 days of cough and body aches, in addition to progressive abdominal pain with nausea vomiting since the day before.  Evaluation in the ED revealed acute pancreatitis with lipase 518.  COVID-19 positive.  Liver enzymes elevated.  Chest x-ray showed acute bronchitic changes.  1/19: Made n.p.o. by speech therapy due to significant decline in the cognitive status and unable to stay awake. 1/20: Tapering his Librium 50->20 mg 3 times daily to see if it helps his mental status 1/21: He is more awake today and speech therapy was able to start him on dysphagia 1 diet.  He does cough and likely aspirating silently - his long-term nutrition remains a challenge and concerning for worsening severe malnutrition. taper his Librium from 20->10 mg 3 times daily 1/22: Per nursing report he has been more awake and was able to eat yesterday.  Did require some Ativan this morning for agitation.  We will continue tapering Librium 10->5 mg TID as needed 1/23 -discontinuing Librium and CIWA protocol. trial off any sedation.  Waiting for SNF 1/24: got very agitated per nursing. So CIWA had to be placed back. Nursing also concerned for constant aspiration when tries to feed.   1/27: again made NPO as sitter reports very coarse cough with feeding pt.     Assessment & Plan   Principal Problem:   Acute respiratory disease due to COVID-19 virus Active Problems:   Alcohol abuse   Hypomagnesemia   Acute pancreatitis   Abnormal LFTs   CAD (coronary artery disease)   Atrial fibrillation, chronic (HCC)   Tobacco abuse   COVID-19    Failure to thrive in adult   Acute metabolic encephalopathy -from alcohol withdrawal  CT head and MRI brain negative for any acute findings. Appears consistent with hypoactive delirium.  Pt has not received Ativan or other significant sedating meds for past few days according to med history.   --Ativan discontinued --Stop bedtime gabapentin --Avoid sedating meds as much as possible ----Delirium precautions:     -Lights and TV off, minimize interruptions at night    -Blinds open and lights on during day    -Glasses/hearing aid with patient    -Frequent reorientation    -PT/OT when able    --up to chair, mobilize as much as possible  Alcohol abuse / Uncomplicated Alcohol Withdrawal - Son reports pt very heavy drinker, all day every day, both beer and liquor for entire adult life. He was not aware of prior withdrawal or if hx of seizure or DT's --Tapered off Librium, completed on 1/23 --Tried off CIWA and all sedation, but started agitation (kicking, pulling at lines and trying to throw things around the room per nursing) so CIWA resumed on 1/24 with PRN Ativan -- Off CIWA again since 1/27 -- stopped gabapentin  Aspiration pneumonia-resolved Dysphagia - resumed on dysphagia 1 / pureed diet honey thick liquids, doing fairly well.   He is recurrently aspirating and is very high risk for recurrent pneumonia.   Made n.p.o. again 1/27, resumed diet 1/28 PM 1/30 reportedly tolerating diet without issues aspirating --Only feed patient when alert, awake  and upright --Do not feed patient if he is sedated/lethargic --Monitor closely with low threshold to return to n.p.o. status --completed 5 days course of Unasyn on 1/23 -- Aspiration precautions --SLP following, plan for MBSS  Inadequate p.o. intake - seems acute on chronic issue based his malnutrition.  Continue D5-NS for maintenance.  Again made n.p.o. 1/27.  Palliative care following, goals of care discussions ongoing.  Should start  tube feeds if continuing full scope of care.  Family requesting PEG tube.   GI consulted. Soonest will be Monday for procedure.  Hypoglycemia -due to poor p.o. intake.  On D5W.  Hypokalemia  / Hypomagnesemia / Hypophosphatemia Electrolytes replaced.  Follow labs.  COVID-19 infection -presented with 10-day history of body aches and cough, tested positive in the ED.  Has not been hypoxic.  Chest x-ray showed bronchitic changes. --Completed remdesivir, Steroids started on admission were stopped given no hypoxia -- Scheduled Mucinex, as needed albuterol -- Vitamin C, zinc -- Completed 5 day course doxycycline for secondary bronchitis  Acute pancreatitis -seems resolved.  Present on admission, due to alcohol abuse.   -- Diet as tolerated -- As needed Zofran  Sinus tachycardia - due to Etoh withdrawal most likely.  Now resolved on metoprolol  Hypertension - Improved.  Normotensive recently. Was likely due to EtOH withdrawal.  Lopressor started A-fib history and tachycardia.    BP improved.  Monitor.  Tobacco abuse -nicotine patch.  Smoking cessation recommended.  Transaminitis -likely due to alcohol abuse.  Monitor CMP closely since on remdesivir. RUQ ultrasound showed likely hepatic steatosis, gall stones.  CAD -chronic, stable without active chest pain or acute ischemic changes on EKG.    A. fib, chronic - Has been normal sinus on tele,was tachycardic related to alcohol withdrawal, now rate controlled for most part.   -- Not on anticoagulation or rate control meds --Continue Lopressor for rate and BP --off tele    DVT prophylaxis: enoxaparin (LOVENOX) injection 40 mg Start: 01/28/20 2200   Diet:  Diet Orders (From admission, onward)    Start     Ordered   02/13/20 1813  DIET - DYS 1 Room service appropriate? Yes; Fluid consistency: Honey Thick  Diet effective now       Question Answer Comment  Room service appropriate? Yes   Fluid consistency: Honey Thick       02/13/20 1812            Code Status: Full Code    Subjective 02/15/20    Pt has blanket over head again today.  Sitter at bedside.  He fed himself breakfast, no witnessed aspiration issues today.  His speech remains very soft / mumbling and difficult to understand for the most part.     Disposition Plan & Communication   Status is: Inpatient  Remains inpatient appropriate because:Awaiting SNF placement, unsafe discharge, ongoing aspiration, inadequate oral intake.  Persistent encephalopathy.  Family request PEG tube.   Dispo:  Patient From: Home  Planned Disposition: Skilled Nursing Facility  Expected discharge date: pending placement / GOC  Medically stable for discharge: No      Family Communication: None at bedside will attempt to call son this afternoon   Consults, Procedures, Significant Events   Consultants:   Palliative care  Procedures:   None  Antimicrobials:  Anti-infectives (From admission, onward)   Start     Dose/Rate Route Frequency Ordered Stop   02/04/20 2200  Ampicillin-Sulbactam (UNASYN) 3 g in sodium chloride 0.9 % 100 mL IVPB  3 g 200 mL/hr over 30 Minutes Intravenous Every 6 hours 02/04/20 1320 02/07/20 2359   02/03/20 1015  amoxicillin-clavulanate (AUGMENTIN) 875-125 MG per tablet 1 tablet  Status:  Discontinued        1 tablet Oral Every 12 hours 02/03/20 0929 02/04/20 1320   01/29/20 1000  azithromycin (ZITHROMAX) tablet 250 mg  Status:  Discontinued       "Followed by" Linked Group Details   250 mg Oral Daily 01/28/20 1010 01/28/20 1025   01/29/20 1000  remdesivir 100 mg in sodium chloride 0.9 % 100 mL IVPB       "Followed by" Linked Group Details   100 mg 200 mL/hr over 30 Minutes Intravenous Daily 01/28/20 1024 02/02/20 0959   01/28/20 1200  remdesivir 200 mg in sodium chloride 0.9% 250 mL IVPB       "Followed by" Linked Group Details   200 mg 580 mL/hr over 30 Minutes Intravenous Once 01/28/20 1024 01/28/20 1318   01/28/20  1030  doxycycline (VIBRA-TABS) tablet 100 mg  Status:  Discontinued        100 mg Oral Every 12 hours 01/28/20 1025 02/01/20 1425   01/28/20 1015  azithromycin (ZITHROMAX) tablet 500 mg  Status:  Discontinued       "Followed by" Linked Group Details   500 mg Oral Daily 01/28/20 1010 01/28/20 1025        Objective   Vitals:   02/15/20 0012 02/15/20 0403 02/15/20 0820 02/15/20 1251  BP: 121/84 120/86 (!) 141/90 126/81  Pulse: 87 87 86 89  Resp: 20 20 18 18   Temp: 98.6 F (37 C) 98.6 F (37 C) 98 F (36.7 C) 98 F (36.7 C)  TempSrc:   Oral Oral  SpO2: 98% 97% 100% 100%  Weight:      Height:        Intake/Output Summary (Last 24 hours) at 02/15/2020 1526 Last data filed at 02/15/2020 0840 Gross per 24 hour  Intake 360 ml  Output 300 ml  Net 60 ml   Filed Weights   01/28/20 0532 02/06/20 0500 02/13/20 0500  Weight: 77.1 kg 73 kg 74.8 kg    Physical Exam:  General exam: sleeping with blanket over head, wakes and interacts better than recent days, NAD Respiratory system: symmetric chest rist, normal respiratory effort, on room air. Cardiovascular system: RRR, no pedal edema,  Neurologic: somewhat less lethargic, soft speech/mumbles, no facial droop and CN's grossly intact Gastrointestinal system: soft, NT, ND Extremities: no edema, normal tone   Labs   Data Reviewed: I have personally reviewed following labs and imaging studies  CBC: Recent Labs  Lab 02/09/20 0810 02/12/20 0552  WBC 11.0* 8.7  HGB 12.5* 13.0  HCT 38.0* 39.0  MCV 94.3 95.4  PLT 332 369   Basic Metabolic Panel: Recent Labs  Lab 02/09/20 0810 02/12/20 0552 02/13/20 0417 02/14/20 0443 02/15/20 0610  NA 149* 145 147* 148* 142  K 2.8* 3.1* 3.5 3.7 3.5  CL 116* 112* 114* 115* 108  CO2 25 24 25 26 23   GLUCOSE 121* 72 79 104* 105*  BUN <5* <5* 8 10 9   CREATININE 0.63 0.56* 0.70 0.72 0.65  CALCIUM 8.1* 8.2* 8.5* 8.6* 8.8*  MG 1.6* 1.8 1.8 1.9 1.9  PHOS 3.4  --  3.0 3.6 3.1    GFR: Estimated Creatinine Clearance: 93.3 mL/min (by C-G formula based on SCr of 0.65 mg/dL). Liver Function Tests: Recent Labs  Lab 02/09/20 0810 02/12/20 0552  AST 83*  78*  ALT 43 37  ALKPHOS 128* 142*  BILITOT 0.9 0.8  PROT 5.3* 5.6*  ALBUMIN 1.8* 1.9*   No results for input(s): LIPASE, AMYLASE in the last 168 hours. No results for input(s): AMMONIA in the last 168 hours. Coagulation Profile: Recent Labs  Lab 02/15/20 1054  INR 1.2   Cardiac Enzymes: No results for input(s): CKTOTAL, CKMB, CKMBINDEX, TROPONINI in the last 168 hours. BNP (last 3 results) No results for input(s): PROBNP in the last 8760 hours. HbA1C: No results for input(s): HGBA1C in the last 72 hours. CBG: No results for input(s): GLUCAP in the last 168 hours. Lipid Profile: No results for input(s): CHOL, HDL, LDLCALC, TRIG, CHOLHDL, LDLDIRECT in the last 72 hours. Thyroid Function Tests: No results for input(s): TSH, T4TOTAL, FREET4, T3FREE, THYROIDAB in the last 72 hours. Anemia Panel: No results for input(s): VITAMINB12, FOLATE, FERRITIN, TIBC, IRON, RETICCTPCT in the last 72 hours. Sepsis Labs: No results for input(s): PROCALCITON, LATICACIDVEN in the last 168 hours.  No results found for this or any previous visit (from the past 240 hour(s)).    Imaging Studies   No results found.   Medications   Scheduled Meds: . amLODipine  5 mg Oral Daily  . vitamin C  500 mg Oral Daily  . aspirin EC  81 mg Oral Daily  . dextromethorphan-guaiFENesin  1 tablet Oral BID  . enoxaparin (LOVENOX) injection  40 mg Subcutaneous Q24H  . magnesium oxide  400 mg Oral BID  . metoprolol tartrate  25 mg Oral BID  . multivitamin with minerals  1 tablet Oral Daily  . nicotine  21 mg Transdermal Daily  . potassium chloride  20 mEq Oral Daily  . senna-docusate  1 tablet Oral QHS  . zinc sulfate  220 mg Oral Daily   Continuous Infusions: . dextrose 75 mL/hr at 02/15/20 0149       LOS: 17 days     Time spent: 25 minutes with greater than 50% spent at bedside and in coordination of care.    Pennie Banter, DO [facility]  02/15/2020, 3:26 PM    If 7PM-7AM, please contact night-coverage. How to contact the [facility] Attending or Consulting provider 7A - 7P or covering provider during after hours 7P -7A, for this patient?    1. Check the care team in [facility] and look for a) attending/consulting [facility] provider listed and b) the [facility] team listed 2. Log into [email or web address] and use 's universal password to access. If you do not have the password, please contact the hospital operator. 3. Locate the [facility] provider you are looking for under [facility] and page to a number that you can be directly reached. 4. If you still have difficulty reaching the provider, please page the [facility] (Director on Call) for the Hospitalists listed on amion for assistance.

## 2020-02-15 NOTE — Progress Notes (Signed)
Patient is anxious and continues to try and get OOB staff constantly redirecting and educating importance of not getting OOB without assistance to remain free of injury. Writer notified MD. Ophelia Charter still present at bedside.

## 2020-02-15 NOTE — Plan of Care (Signed)
Pt alert. Sitter at bedside. 5% dextrose runninfg @ 75cc/hr. The patient is report that a a pin like pain on his R fore arm. RN assessed it, it is from a blood draw looks like phlebitis pt said it was from a PIV that did not workout and it looks like metal was left in him, tylenol given. pt does not want cold compress. Dr. Arville Care notified  Problem: Coping: Goal: Level of anxiety will decrease Outcome: Progressing   Problem: Pain Managment: Goal: General experience of comfort will improve Outcome: Progressing   Problem: Safety: Goal: Ability to remain free from injury will improve Outcome: Progressing

## 2020-02-16 DIAGNOSIS — Z515 Encounter for palliative care: Secondary | ICD-10-CM | POA: Diagnosis not present

## 2020-02-16 DIAGNOSIS — R627 Adult failure to thrive: Secondary | ICD-10-CM | POA: Diagnosis not present

## 2020-02-16 DIAGNOSIS — Z7189 Other specified counseling: Secondary | ICD-10-CM | POA: Diagnosis not present

## 2020-02-16 DIAGNOSIS — U071 COVID-19: Secondary | ICD-10-CM | POA: Diagnosis not present

## 2020-02-16 DIAGNOSIS — I482 Chronic atrial fibrillation, unspecified: Secondary | ICD-10-CM | POA: Diagnosis not present

## 2020-02-16 DIAGNOSIS — F1010 Alcohol abuse, uncomplicated: Secondary | ICD-10-CM | POA: Diagnosis not present

## 2020-02-16 DIAGNOSIS — J069 Acute upper respiratory infection, unspecified: Secondary | ICD-10-CM | POA: Diagnosis not present

## 2020-02-16 LAB — CBC
HCT: 41.2 % (ref 39.0–52.0)
Hemoglobin: 13.6 g/dL (ref 13.0–17.0)
MCH: 30.8 pg (ref 26.0–34.0)
MCHC: 33 g/dL (ref 30.0–36.0)
MCV: 93.2 fL (ref 80.0–100.0)
Platelets: 354 10*3/uL (ref 150–400)
RBC: 4.42 MIL/uL (ref 4.22–5.81)
RDW: 13.2 % (ref 11.5–15.5)
WBC: 9.5 10*3/uL (ref 4.0–10.5)
nRBC: 0 % (ref 0.0–0.2)

## 2020-02-16 LAB — BASIC METABOLIC PANEL
Anion gap: 10 (ref 5–15)
BUN: 8 mg/dL (ref 8–23)
CO2: 25 mmol/L (ref 22–32)
Calcium: 8.8 mg/dL — ABNORMAL LOW (ref 8.9–10.3)
Chloride: 104 mmol/L (ref 98–111)
Creatinine, Ser: 0.56 mg/dL — ABNORMAL LOW (ref 0.61–1.24)
GFR, Estimated: >60 mL/min (ref >60)
Glucose, Bld: 130 mg/dL — ABNORMAL HIGH (ref 70–99)
Potassium: 3.8 mmol/L (ref 3.5–5.1)
Sodium: 139 mmol/L (ref 135–145)

## 2020-02-16 LAB — MAGNESIUM: Magnesium: 1.8 mg/dL (ref 1.7–2.4)

## 2020-02-16 LAB — PHOSPHORUS: Phosphorus: 4.2 mg/dL (ref 2.5–4.6)

## 2020-02-16 NOTE — Progress Notes (Addendum)
PROGRESS NOTE    Thomas Coffey   UKG:[id number]  DOB: Sep 17, 1955  PCP: Patient, [name]    DOA: 01/28/2020 LOS: 18   Brief Narrative   Thomas Coffey is a 65 y.o. male with medical history significant of alcohol abuse, tobacco abuse, alcoholic pancreatitis, CAD, myocardial infarction, atrial fibrillation not on anticoagulants, who presented to the ED on 01/28/2020 with 10 days of cough and body aches, in addition to progressive abdominal pain with nausea vomiting since the day before.  Evaluation in the ED revealed acute pancreatitis with lipase 518.  COVID-19 positive.  Liver enzymes elevated.  Chest x-ray showed acute bronchitic changes.  Patient has had issues with dysphagia, and made NPO on multiple occasions.  This seems secondary to his encephalopathy.  SLP consulted.  Currently tolerating dysphagia 1 diet.   Has been off CIWA and Ativan and doing well.  Started on PRN Vistaril for anxiety/agitation as pt continues to try to get out of the bed.  He has mostly been redirectable by staff for the past few days.  Hospitalization prolonged due to difficult disposition.  Pt requires SNF placement, is profoundly weak.  Palliative discussions on GOC with family are ongoing.  May get PEG tube.     Assessment & Plan   Principal Problem:   Acute respiratory disease due to COVID-19 virus Active Problems:   Alcohol abuse   Hypomagnesemia   Acute pancreatitis   Abnormal LFTs   CAD (coronary artery disease)   Atrial fibrillation, chronic (HCC)   Tobacco abuse   COVID-19   Failure to thrive in adult   Acute metabolic encephalopathy -persistent after prolonged course of alcohol withdrawal.  CT head and MRI brain negative for any acute findings. Appears consistent with hypoactive delirium.   --Started PRN Vistaril for anxiety/agitation, only use if pt not redirectable --Ativan/CIWA discontinued --Stop bedtime gabapentin --Avoid sedating meds as much as possible ----Delirium  precautions:     -Lights and TV off, minimize interruptions at night    -Blinds open and lights on during day    -Glasses/hearing aid with patient    -Frequent reorientation    -PT/OT when able    --up to chair, mobilize as much as possible  Alcohol abuse / Uncomplicated Alcohol Withdrawal - Son reports pt very heavy drinker, all day every day, both beer and liquor for entire adult life. He was not aware of prior withdrawal or if hx of seizure or DT's --Tapered off Librium, completed on 1/23 -- Off CIWA since 1/27 -- stopped gabapentin  Aspiration pneumonia-resolved. Dysphagia - resumed again on dysphagia 1 / pureed diet honey thick liquids 1/28, doing fairly well.   Swallow function waxes and wanes. He is recurrently aspirating and is very high risk for recurrent pneumonia.   --Only feed patient when alert, awake and upright --Do not feed patient if he is sedated/lethargic --Monitor closely with low threshold to return to n.p.o. status --completed 5 days course of Unasyn on 1/23 -- Aspiration precautions --SLP following, plan for MBSS  Inadequate p.o. intake - seems acute on chronic issue based his malnutrition.   --Palliative care following, goals of care discussions ongoing.  Should start tube feeds if continuing full scope of care.  Family requesting PEG tube.   GI consulted. --48 hour Calorie count ordered  Hypoglycemia -due to poor p.o. intake.  Treated with D5W. --stop D5w and monitor, pt eating little better  Hypokalemia  / Hypomagnesemia / Hypophosphatemia Electrolytes replaced.  Follow labs.  COVID-19 infection -presented with 10-day history of body aches and cough, tested positive in the ED.  Has not been hypoxic.  Chest x-ray showed bronchitic changes. --Completed remdesivir, Steroids started on admission were stopped given no hypoxia -- Scheduled Mucinex, as needed albuterol -- Vitamin C, zinc -- Completed 5 day course doxycycline for secondary  bronchitis  Acute pancreatitis -seems resolved.  Present on admission, due to alcohol abuse.   -- Diet as tolerated -- As needed Zofran  Sinus tachycardia - Resolved.  Due to Etoh withdrawal most likely.  Now resolved on metoprolol  Hypertension - Improved.  Normotensive recently. Was likely due to EtOH withdrawal.  Lopressor started A-fib history and tachycardia.    BP improved.  Monitor.  Tobacco abuse -nicotine patch.  Smoking cessation recommended.  Transaminitis -likely due to alcohol abuse.  Monitor CMP closely since on remdesivir. RUQ ultrasound showed likely hepatic steatosis, gall stones.  CAD -chronic, stable without active chest pain or acute ischemic changes on EKG.    A. fib, chronic - Has been normal sinus on tele,was tachycardic related to alcohol withdrawal, now rate controlled for most part.   -- Not on anticoagulation or rate control meds --Continue Lopressor for rate and BP --off tele    DVT prophylaxis: enoxaparin (LOVENOX) injection 40 mg Start: 01/28/20 2200   Diet:  Diet Orders (From admission, onward)    Start     Ordered   02/13/20 1813  DIET - DYS 1 Room service appropriate? Yes; Fluid consistency: Honey Thick  Diet effective now       Question Answer Comment  Room service appropriate? Yes   Fluid consistency: Honey Thick      02/13/20 1812            Code Status: Full Code    Subjective 02/16/20    Pt awake when seen today, more alert than past couple days.  His speech is slightly more clear today.  Sitter at bedside.  Reportedly no issues with aspiration.  Pt complains of right hand pain, like pins/needles. Onset not very clear, he says since he's been here but has not mentioned until today.  Say hx of gout but this pain different.   Disposition Plan & Communication   Status is: Inpatient  Remains inpatient appropriate because:Awaiting SNF placement, unsafe discharge, ongoing aspiration, inadequate oral intake.  Persistent  encephalopathy.  Family request PEG tube.   Dispo:  Patient From: Home  Planned Disposition: Skilled Nursing Facility  Expected discharge date: >3 days  Medically stable for discharge: No   Difficult to place patient: Yes   Family Communication: None at bedside will attempt to call son this afternoon.  Updated by nursing, palliative, GI as well.   Consults, Procedures, Significant Events   Consultants:   Palliative care  Procedures:   None  Antimicrobials:  Anti-infectives (From admission, onward)   Start     Dose/Rate Coffey Frequency Ordered Stop   02/04/20 2200  Ampicillin-Sulbactam (UNASYN) 3 g in sodium chloride 0.9 % 100 mL IVPB        3 g 200 mL/hr over 30 Minutes Intravenous Every 6 hours 02/04/20 1320 02/07/20 2359   02/03/20 1015  amoxicillin-clavulanate (AUGMENTIN) 875-125 MG per tablet 1 tablet  Status:  Discontinued        1 tablet Oral Every 12 hours 02/03/20 0929 02/04/20 1320   01/29/20 1000  azithromycin (ZITHROMAX) tablet 250 mg  Status:  Discontinued       "Followed by" Linked Group Details  250 mg Oral Daily 01/28/20 1010 01/28/20 1025   01/29/20 1000  remdesivir 100 mg in sodium chloride 0.9 % 100 mL IVPB       "Followed by" Linked Group Details   100 mg 200 mL/hr over 30 Minutes Intravenous Daily 01/28/20 1024 02/02/20 0959   01/28/20 1200  remdesivir 200 mg in sodium chloride 0.9% 250 mL IVPB       "Followed by" Linked Group Details   200 mg 580 mL/hr over 30 Minutes Intravenous Once 01/28/20 1024 01/28/20 1318   01/28/20 1030  doxycycline (VIBRA-TABS) tablet 100 mg  Status:  Discontinued        100 mg Oral Every 12 hours 01/28/20 1025 02/01/20 1425   01/28/20 1015  azithromycin (ZITHROMAX) tablet 500 mg  Status:  Discontinued       "Followed by" Linked Group Details   500 mg Oral Daily 01/28/20 1010 01/28/20 1025        Objective   Vitals:   02/16/20 0012 02/16/20 0410 02/16/20 0839 02/16/20 1300  BP: 122/81 136/83 130/78 129/81  Pulse:  100 (!) 103 97 100  Resp: 20 20 19 20   Temp: 99.1 F (37.3 C) 98.7 F (37.1 C) 98.6 F (37 C) 98.4 F (36.9 C)  TempSrc:   Oral Oral  SpO2: 100% 95% 98% 99%  Weight:      Height:        Intake/Output Summary (Last 24 hours) at 02/16/2020 1931 Last data filed at 02/16/2020 1821 Gross per 24 hour  Intake 360 ml  Output 1100 ml  Net -740 ml   Filed Weights   01/28/20 0532 02/06/20 0500 02/13/20 0500  Weight: 77.1 kg 73 kg 74.8 kg    Physical Exam:  General exam: awake sitting up in bed, NAD, more alert today Respiratory system: normal respiratory effort, on room air. Cardiovascular system: RRR, no pedal edema, 2+radial pulses Neurologic: soft speech/mumbles but improved some today, no gross focal motor deficits Gastrointestinal system: soft, NT, ND Extremities: R hand non-tender and without warm, hypertrophic MCP joints   Labs   Data Reviewed: I have personally reviewed following labs and imaging studies  CBC: Recent Labs  Lab 02/12/20 0552 02/16/20 1413  WBC 8.7 9.5  HGB 13.0 13.6  HCT 39.0 41.2  MCV 95.4 93.2  PLT 369 354   Basic Metabolic Panel: Recent Labs  Lab 02/12/20 0552 02/13/20 0417 02/14/20 0443 02/15/20 0610 02/16/20 1413  NA 145 147* 148* 142 139  K 3.1* 3.5 3.7 3.5 3.8  CL 112* 114* 115* 108 104  CO2 24 25 26 23 25   GLUCOSE 72 79 104* 105* 130*  BUN <5* 8 10 9 8   CREATININE 0.56* 0.70 0.72 0.65 0.56*  CALCIUM 8.2* 8.5* 8.6* 8.8* 8.8*  MG 1.8 1.8 1.9 1.9 1.8  PHOS  --  3.0 3.6 3.1 4.2   GFR: Estimated Creatinine Clearance: 93.3 mL/min (A) (by C-G formula based on SCr of 0.56 mg/dL (L)). Liver Function Tests: Recent Labs  Lab 02/12/20 0552  AST 78*  ALT 37  ALKPHOS 142*  BILITOT 0.8  PROT 5.6*  ALBUMIN 1.9*   No results for input(s): LIPASE, AMYLASE in the last 168 hours. No results for input(s): AMMONIA in the last 168 hours. Coagulation Profile: Recent Labs  Lab 02/15/20 1054  INR 1.2   Cardiac Enzymes: No results for  input(s): CKTOTAL, CKMB, CKMBINDEX, TROPONINI in the last 168 hours. BNP (last 3 results) No results for input(s): PROBNP in the last  8760 hours. HbA1C: No results for input(s): HGBA1C in the last 72 hours. CBG: No results for input(s): GLUCAP in the last 168 hours. Lipid Profile: No results for input(s): CHOL, HDL, LDLCALC, TRIG, CHOLHDL, LDLDIRECT in the last 72 hours. Thyroid Function Tests: No results for input(s): TSH, T4TOTAL, FREET4, T3FREE, THYROIDAB in the last 72 hours. Anemia Panel: No results for input(s): VITAMINB12, FOLATE, FERRITIN, TIBC, IRON, RETICCTPCT in the last 72 hours. Sepsis Labs: No results for input(s): PROCALCITON, LATICACIDVEN in the last 168 hours.  No results found for this or any previous visit (from the past 240 hour(s)).    Imaging Studies   No results found.   Medications   Scheduled Meds: . amLODipine  5 mg Oral Daily  . vitamin C  500 mg Oral Daily  . aspirin EC  81 mg Oral Daily  . dextromethorphan-guaiFENesin  1 tablet Oral BID  . enoxaparin (LOVENOX) injection  40 mg Subcutaneous Q24H  . magnesium oxide  400 mg Oral BID  . metoprolol tartrate  25 mg Oral BID  . multivitamin with minerals  1 tablet Oral Daily  . nicotine  21 mg Transdermal Daily  . potassium chloride  20 mEq Oral Daily  . senna-docusate  1 tablet Oral QHS  . zinc sulfate  220 mg Oral Daily   Continuous Infusions: . dextrose 75 mL/hr at 02/16/20 0526       LOS: 18 days    Time spent: 25 minutes with greater than 50% spent at bedside and in coordination of care.    Pennie Banter, DO [facility]  02/16/2020, 7:31 PM    If 7PM-7AM, please contact night-coverage. How to contact the [facility] Attending or Consulting provider 7A - 7P or covering provider during after hours 7P -7A, for this patient?    1. Check the care team in [facility] and look for a) attending/consulting [facility] provider listed and b) the [facility] team listed 2. Log into [email or web address] and use [facility]'s universal password to access. If you do not have the password, please contact the hospital operator. 3. Locate the [facility] provider you are looking for under [facility] and page to a number that you can be directly reached. 4. If you still have difficulty reaching the provider, please page the [facility] (Director on Call) for the Hospitalists listed on amion for assistance.

## 2020-02-16 NOTE — Progress Notes (Signed)
Daily Progress Note   Patient Name: Thomas Coffey       Date: 02/16/2020 DOB: August 03, 1955  Age: 65 y.o. MRN#: [id number] Attending Physician: Pennie Banter, DO Primary Care Physician: Patient, [name] Admit Date: 01/28/2020  Reason for Consultation/Follow-up: Establishing goals of care  Subjective: Patient sitting with covers pulled tightly over his head. Spoke with sitter; patient ate well today but did have cough and congestion afterwards. Spoke with sons. They state son was able to visit over the weekend and he did well with eating and no congestion or coughing noted. Sounds as though appetite and swallowing waxes and wanes. Agree with plan for MBS and family is aware the MBS is a set window of time, and that his status can fluctuate. Family remains amenable to attempting a feeding tube DHT or PEG as needed. Discussed post discharge and SNF placement. They are discussing if they believe patient would be willing to go to SNF for rehab.   Will continue to follow.     Length of Stay: 18  Current Medications: Scheduled Meds:  . amLODipine  5 mg Oral Daily  . vitamin C  500 mg Oral Daily  . aspirin EC  81 mg Oral Daily  . dextromethorphan-guaiFENesin  1 tablet Oral BID  . enoxaparin (LOVENOX) injection  40 mg Subcutaneous Q24H  . magnesium oxide  400 mg Oral BID  . metoprolol tartrate  25 mg Oral BID  . multivitamin with minerals  1 tablet Oral Daily  . nicotine  21 mg Transdermal Daily  . potassium chloride  20 mEq Oral Daily  . senna-docusate  1 tablet Oral QHS  . zinc sulfate  220 mg Oral Daily    Continuous Infusions: . dextrose 75 mL/hr at 02/16/20 0526    PRN Meds: acetaminophen, albuterol, bisacodyl, hydrOXYzine, ipratropium, ondansetron (ZOFRAN) IV  Physical  Exam Constitutional:      Comments: Covers pulled tightly over head.              Vital Signs: BP 130/78 (BP Location: Right Arm)   Pulse 97   Temp 98.6 F (37 C) (Oral)   Resp 19   Ht 5\' 9"  (1.753 m)   Wt 74.8 kg   SpO2 98%   BMI 24.37 kg/m  SpO2: SpO2: 98 % O2 Device: O2  Device: Room Air O2 Flow Rate:    Intake/output summary:   Intake/Output Summary (Last 24 hours) at 02/16/2020 1200 Last data filed at 02/16/2020 1017 Gross per 24 hour  Intake 480 ml  Output 500 ml  Net -20 ml   LBM: Last BM Date: 02/15/20 Baseline Weight: Weight: 77.1 kg Most recent weight: Weight: 74.8 kg         Flowsheet Rows   Flowsheet Row Most Recent Value  Intake Tab   Referral Department Hospitalist  Unit at Time of Referral Med/Surg Unit  Palliative Care Primary Diagnosis Sepsis/Infectious Disease  Date Notified 02/04/20  Palliative Care Type New Palliative care  Reason for referral Clarify Goals of Care  Date of Admission 01/28/20  Date first seen by Palliative Care 02/04/20  # of days Palliative referral response time 0 Day(s)  # of days IP prior to Palliative referral 7  Clinical Assessment   Psychosocial & Spiritual Assessment   Palliative Care Outcomes       Patient Active Problem List   Diagnosis Date Noted  . Failure to thrive in adult 02/13/2020  . COVID-19 01/29/2020  . COVID-19 virus infection 01/28/2020  . Abnormal LFTs 01/28/2020  . CAD (coronary artery disease) 01/28/2020  . Atrial fibrillation, chronic (HCC) 01/28/2020  . Acute respiratory disease due to COVID-19 virus 01/28/2020  . Tobacco abuse 01/28/2020  . Acute pancreatitis 07/20/2019  . Hypomagnesemia 11/24/2018  . Protein-calorie malnutrition, severe 11/23/2018  . Pleural effusion on left   . Liver function test abnormality   . AKI (acute kidney injury) (HCC)   . Atrial fibrillation with rapid ventricular response (HCC)   . Acute hepatitis 11/17/2018  . Atrial fibrillation with RVR (HCC)  11/17/2018  . Alcohol abuse 11/17/2018  . Tobacco dependence syndrome 11/17/2018  . Empyema [facility])     Palliative Care Assessment & Plan    Recommendations/Plan: Agree with plan for MBS. Swallowing and appetite sound to wax and wane. Full code/ full scope.     Code Status:    Code Status Orders  (From admission, onward)         Start     Ordered   01/28/20 1203  Full code  Continuous        01/28/20 1202        Code Status History    Date Active Date Inactive Code Status Order ID Comments User Context   07/20/2019 0230 07/20/2019 1411 Full Code [id number]  Arville Care Vernetta Honey, MD ED   11/11/2018 2355 11/25/2018 1856 Full Code [id number]  Houston Siren, MD Inpatient   Advance Care Planning Activity      Prognosis:  Unable to determine   Thank you for allowing the Palliative Medicine Team to assist in the care of this patient.   Total Time 25 min Prolonged Time Billed  no      Greater than 50%  of this time was spent counseling and coordinating care related to the above assessment and plan.  Morton Stall, NP  Please contact Palliative Medicine Team phone at [phone] for questions and concerns.

## 2020-02-17 DIAGNOSIS — J069 Acute upper respiratory infection, unspecified: Secondary | ICD-10-CM | POA: Diagnosis not present

## 2020-02-17 DIAGNOSIS — U071 COVID-19: Secondary | ICD-10-CM | POA: Diagnosis not present

## 2020-02-17 LAB — BASIC METABOLIC PANEL
Anion gap: 8 (ref 5–15)
BUN: 9 mg/dL (ref 8–23)
CO2: 23 mmol/L (ref 22–32)
Calcium: 8.8 mg/dL — ABNORMAL LOW (ref 8.9–10.3)
Chloride: 105 mmol/L (ref 98–111)
Creatinine, Ser: 0.67 mg/dL (ref 0.61–1.24)
GFR, Estimated: >60 mL/min (ref >60)
Glucose, Bld: 80 mg/dL (ref 70–99)
Potassium: 4.8 mmol/L (ref 3.5–5.1)
Sodium: 136 mmol/L (ref 135–145)

## 2020-02-17 LAB — MAGNESIUM: Magnesium: 1.8 mg/dL (ref 1.7–2.4)

## 2020-02-17 LAB — PHOSPHORUS: Phosphorus: 3.8 mg/dL (ref 2.5–4.6)

## 2020-02-17 NOTE — Progress Notes (Addendum)
Daily Progress Note   Patient Name: Thomas Coffey       Date: 02/17/2020 DOB: 1955-09-21  Age: 65 y.o. MRN#: [id number] Attending Physician: Pennie Banter, DO Primary Care Physician: Patient, [name] Admit Date: 01/28/2020  Reason for Consultation/Follow-up: Establishing goals of care  Subjective: Patient resting in bed. He does not take part in GOC conversation.  Appetite and swallow function seems to wax and wane. Spoke with sons.  Discussed his decline of some of the care offered. Discussed current recommendations for SNF. Discussed QOL and keeping in the forefront what Thomas Coffey would want to do. Discussed aggressive path vs comfort focused path. Given continued concerns for safe oral intake and sustainable amount of oral intake, sons would like to move forward with PEG placement and SNF. They would like to offer more time, and what care possible to try to improve his status to baseline, but understand he sometimes does not want to work with certain therapies or have procedures. Sons discuss openly between themselves that "you can't make someone do something they don't want to do." Sons would like to offer PEG and SNF, We discussed that if he declines/ does not allow services, a plan to transition to comfort focused care could be implemented.  ADDENDUM: Returned to bedside after discussion with sons. Son Thomas Coffey is at bedside at this time. He states he has spoken with his father regarding care, and that everyone is trying to help him. We discussed the plans for a feeding tube and SNF. At this time patient answers questions and participates in conversation. He states he is okay with this plan for PEG and SNF if needed, or whatever plan is needed to improve his status and prolong his time.      Length of Stay: 19  Current Medications: Scheduled Meds:  . amLODipine  5 mg Oral Daily  . vitamin C  500 mg Oral Daily  . aspirin EC  81 mg Oral Daily  . dextromethorphan-guaiFENesin  1 tablet Oral BID  . enoxaparin (LOVENOX) injection  40 mg Subcutaneous Q24H  . magnesium oxide  400 mg Oral BID  . metoprolol tartrate  25 mg Oral BID  . multivitamin with minerals  1 tablet Oral Daily  . nicotine  21 mg Transdermal Daily  . potassium chloride  20  mEq Oral Daily  . senna-docusate  1 tablet Oral QHS  . zinc sulfate  220 mg Oral Daily    Continuous Infusions:   PRN Meds: acetaminophen, albuterol, bisacodyl, hydrOXYzine, ipratropium, ondansetron (ZOFRAN) IV  Physical Exam Constitutional:      Comments: Covers over head.             Vital Signs: BP 104/79 (BP Location: Left Arm)   Pulse 96   Temp 97.6 F (36.4 C) (Axillary)   Resp 16   Ht 5\' 9"  (1.753 m)   Wt 74.8 kg   SpO2 94% Comment: Room Air  BMI 24.37 kg/m  SpO2: SpO2: 94 % (Room Air) O2 Device: O2 Device:  (Room Air) O2 Flow Rate:    Intake/output summary:   Intake/Output Summary (Last 24 hours) at 02/17/2020 1332 Last data filed at 02/17/2020 1020 Gross per 24 hour  Intake 240 ml  Output 1350 ml  Net -1110 ml   LBM: Last BM Date: 02/17/20 Baseline Weight: Weight: 77.1 kg Most recent weight: Weight: 74.8 kg         Flowsheet Rows   Flowsheet Row Most Recent Value  Intake Tab   Referral Department Hospitalist  Unit at Time of Referral Med/Surg Unit  Palliative Care Primary Diagnosis Sepsis/Infectious Disease  Date Notified 02/04/20  Palliative Care Type New Palliative care  Reason for referral Clarify Goals of Care  Date of Admission 01/28/20  Date first seen by Palliative Care 02/04/20  # of days Palliative referral response time 0 Day(s)  # of days IP prior to Palliative referral 7  Clinical Assessment   Psychosocial & Spiritual Assessment   Palliative Care Outcomes       Patient Active  Problem List   Diagnosis Date Noted  . Failure to thrive in adult 02/13/2020  . COVID-19 01/29/2020  . COVID-19 virus infection 01/28/2020  . Abnormal LFTs 01/28/2020  . CAD (coronary artery disease) 01/28/2020  . Atrial fibrillation, chronic (HCC) 01/28/2020  . Acute respiratory disease due to COVID-19 virus 01/28/2020  . Tobacco abuse 01/28/2020  . Acute pancreatitis 07/20/2019  . Hypomagnesemia 11/24/2018  . Protein-calorie malnutrition, severe 11/23/2018  . Pleural effusion on left   . Liver function test abnormality   . AKI (acute kidney injury) (HCC)   . Atrial fibrillation with rapid ventricular response (HCC)   . Acute hepatitis 11/17/2018  . Atrial fibrillation with RVR (HCC) 11/17/2018  . Alcohol abuse 11/17/2018  . Tobacco dependence syndrome 11/17/2018  . Empyema [facility])     Palliative Care Assessment & Plan    Recommendations/Plan:  Spoke with family. Given swallowing and calorie concerns, patient/family is okay with PEG and SNF as he is full code/full scope. Focus on comfort if he declines/will not allow these services.    Palliative recommended at D/C.    Code Status: .    Code Status Orders  (From admission, onward)         Start     Ordered   01/28/20 1203  Full code  Continuous        01/28/20 1202        Code Status History    Date Active Date Inactive Code Status Order ID Comments User Context   07/20/2019 0230 07/20/2019 1411 Full Code 09/20/2019  [id number] Arville Care, MD ED   11/11/2018 2355 11/25/2018 1856 Full Code 13/09/2018  [id number], MD Inpatient   Advance Care Planning Activity       Prognosis:  Poor overall  Care plan was discussed with primary MD, SLP.   Thank you for allowing the Palliative Medicine Team to assist in the care of this patient.   Total Time 35 min Prolonged Time Billed  no      Greater than 50%  of this time was spent counseling and coordinating care related to the above assessment and plan.  Morton Stall, NP  Please contact Palliative Medicine Team phone at [phone] for questions and concerns.

## 2020-02-17 NOTE — Progress Notes (Addendum)
PROGRESS NOTE    Thomas Coffey   IWP:[id number]  DOB: 06/05/1955  PCP: Patient, [name]    DOA: 01/28/2020 LOS: 19   Brief Narrative   Thomas Coffey is a 65 y.o. male with medical history significant of alcohol abuse, tobacco abuse, alcoholic pancreatitis, CAD, myocardial infarction, atrial fibrillation not on anticoagulants, who presented to the ED on 01/28/2020 with 10 days of cough and body aches, in addition to progressive abdominal pain with nausea vomiting since the day before.  Evaluation in the ED revealed acute pancreatitis with lipase 518.  COVID-19 positive.  Liver enzymes elevated.  Chest x-ray showed acute bronchitic changes.  Patient has had issues with dysphagia, and made NPO on multiple occasions.  This seems secondary to his encephalopathy.  SLP consulted.  Currently tolerating dysphagia 1 diet.   Has been off CIWA and Ativan and doing well.  Started on PRN Vistaril for anxiety/agitation as pt continues to try to get out of the bed.  He has mostly been redirectable by staff for the past few days.  Hospitalization prolonged due to difficult disposition.  Pt requires SNF placement, is profoundly weak.  Palliative discussions on GOC with family are ongoing.  May get PEG tube.     Assessment & Plan   Principal Problem:   Acute respiratory disease due to COVID-19 virus Active Problems:   Alcohol abuse   Hypomagnesemia   Acute pancreatitis   Abnormal LFTs   CAD (coronary artery disease)   Atrial fibrillation, chronic (HCC)   Tobacco abuse   COVID-19   Failure to thrive in adult   Acute metabolic encephalopathy -persistent after prolonged course of alcohol withdrawal.  CT head and MRI brain negative for any acute findings. Appears consistent with hypoactive delirium.   --Started PRN Vistaril for anxiety/agitation, only use if pt not redirectable --Ativan/CIWA discontinued --Stop bedtime gabapentin --Avoid sedating meds as much as possible ----Delirium  precautions:     -Lights and TV off, minimize interruptions at night    -Blinds open and lights on during day    -Glasses/hearing aid with patient    -Frequent reorientation    -PT/OT when able    --up to chair, mobilize as much as possible  Alcohol abuse / Uncomplicated Alcohol Withdrawal -resolved Son reports pt very heavy drinker, all day every day, both beer and liquor for entire adult life. He was not aware of prior withdrawal or if hx of seizure or DT's --Tapered off Librium, completed on 1/23 -- Off CIWA since 1/27 -- stopped gabapentin  Aspiration pneumonia-resolved. Dysphagia - resumed again on dysphagia 1 / pureed diet honey thick liquids 1/28, doing fairly well.   Swallow function waxes and wanes with his level of alertness. He is recurrently aspirating and is very high risk for recurrent pneumonia.   --Only feed patient when alert, awake and upright --Do not feed patient if he is sedated/lethargic --Monitor closely with low threshold to return to n.p.o. status --completed 5 days course of Unasyn on 1/23 -- Aspiration precautions --SLP following --Follow-up on 48-hour calorie count which expect to finish tomorrow evening --GI consulted for PEG tube placement --Initiate tube feeds once PEG in place   Inadequate p.o. intake - seems acute on chronic issue based his malnutrition.   --Palliative care following, goals of care discussions ongoing.  Should start tube feeds if continuing full scope of care.  Family requesting PEG tube.   GI consulted. --48 hour Calorie count ordered  Hypoglycemia -due to poor  p.o. intake.  Treated with D5W. --stop D5w and monitor, pt eating little better  Hypokalemia  / Hypomagnesemia / Hypophosphatemia Electrolytes replaced.  Follow labs.  COVID-19 infection -presented with 10-day history of body aches and cough, tested positive in the ED.  Has not been hypoxic.  Chest x-ray showed bronchitic changes. --Completed remdesivir, Steroids  started on admission were stopped given no hypoxia -- Scheduled Mucinex, as needed albuterol -- Vitamin C, zinc -- Completed 5 day course doxycycline for secondary bronchitis  Acute pancreatitis -seems resolved.  Present on admission, due to alcohol abuse.   -- Diet as tolerated -- As needed Zofran  Sinus tachycardia - Resolved.  Due to Etoh withdrawal most likely.  Now resolved on metoprolol  Hypertension - Improved.  Normotensive recently. Was likely due to EtOH withdrawal.  Lopressor started A-fib history and tachycardia.    BP improved.  Monitor.  Tobacco abuse -nicotine patch.  Smoking cessation recommended.  Transaminitis -likely due to alcohol abuse.  Monitor CMP closely since on remdesivir. RUQ ultrasound showed likely hepatic steatosis, gall stones.  CAD -chronic, stable without active chest pain or acute ischemic changes on EKG.    A. fib, chronic - Has been normal sinus on tele,was tachycardic related to alcohol withdrawal, now rate controlled for most part.   -- Not on anticoagulation or rate control meds --Continue Lopressor for rate and BP --off tele    DVT prophylaxis: enoxaparin (LOVENOX) injection 40 mg Start: 01/28/20 2200   Diet:  Diet Orders (From admission, onward)    Start     Ordered   02/13/20 1813  DIET - DYS 1 Room service appropriate? Yes; Fluid consistency: Honey Thick  Diet effective now       Question Answer Comment  Room service appropriate? Yes   Fluid consistency: Honey Thick      02/13/20 1812            Code Status: Full Code    Subjective 02/17/20    Pt seen with sitter at bedside. Patient sleeping and is more difficult to arouse today. He has not received any sedating medications however. Sitter reports he has been this way all day. No acute events reported overnight.   Disposition Plan & Communication   Status is: Inpatient  Remains inpatient appropriate because:Awaiting SNF placement, unsafe discharge, ongoing  aspiration, inadequate oral intake.  Persistent encephalopathy. Family request PEG tube. Once PEG is placed and tube feeds at goal, discharge to SNF.   Dispo:  Patient From: Home  Planned Disposition: Skilled Nursing Facility  Expected discharge date: >3 days  Medically stable for discharge: No   Difficult to place patient: Yes   Family Communication: None at bedside will attempt to call son this afternoon.  Updated by nursing, palliative, GI as well.   Consults, Procedures, Significant Events   Consultants:   Palliative care  Gastroenterology  Procedures:   None  Antimicrobials:  Anti-infectives (From admission, onward)   Start     Dose/Rate Route Frequency Ordered Stop   02/04/20 2200  Ampicillin-Sulbactam (UNASYN) 3 g in sodium chloride 0.9 % 100 mL IVPB        3 g 200 mL/hr over 30 Minutes Intravenous Every 6 hours 02/04/20 1320 02/07/20 2359   02/03/20 1015  amoxicillin-clavulanate (AUGMENTIN) 875-125 MG per tablet 1 tablet  Status:  Discontinued        1 tablet Oral Every 12 hours 02/03/20 0929 02/04/20 1320   01/29/20 1000  azithromycin (ZITHROMAX) tablet 250 mg  Status:  Discontinued       "Followed by" Linked Group Details   250 mg Oral Daily 01/28/20 1010 01/28/20 1025   01/29/20 1000  remdesivir 100 mg in sodium chloride 0.9 % 100 mL IVPB       "Followed by" Linked Group Details   100 mg 200 mL/hr over 30 Minutes Intravenous Daily 01/28/20 1024 02/02/20 0959   01/28/20 1200  remdesivir 200 mg in sodium chloride 0.9% 250 mL IVPB       "Followed by" Linked Group Details   200 mg 580 mL/hr over 30 Minutes Intravenous Once 01/28/20 1024 01/28/20 1318   01/28/20 1030  doxycycline (VIBRA-TABS) tablet 100 mg  Status:  Discontinued        100 mg Oral Every 12 hours 01/28/20 1025 02/01/20 1425   01/28/20 1015  azithromycin (ZITHROMAX) tablet 500 mg  Status:  Discontinued       "Followed by" Linked Group Details   500 mg Oral Daily 01/28/20 1010 01/28/20 1025         Objective   Vitals:   02/16/20 2332 02/17/20 0258 02/17/20 0731 02/17/20 1132  BP: 109/76 109/80 103/77 104/79  Pulse: 93 91 98 96  Resp: 18 20 18 16   Temp: 97.9 F (36.6 C)  97.9 F (36.6 C) 97.6 F (36.4 C)  TempSrc: Oral  Axillary Axillary  SpO2: 93% 92% 94% 94%  Weight:      Height:        Intake/Output Summary (Last 24 hours) at 02/17/2020 1821 Last data filed at 02/17/2020 1438 Gross per 24 hour  Intake 240 ml  Output 650 ml  Net -410 ml   Filed Weights   01/28/20 0532 02/06/20 0500 02/13/20 0500  Weight: 77.1 kg 73 kg 74.8 kg    Physical Exam:  General exam: Sleeping comfortably, less arousable today, no acute distress Respiratory system: CTAB, normal respiratory effort, on room air. Cardiovascular system: RRR, no pedal edema Gastrointestinal system: soft, NT, ND    Labs   Data Reviewed: I have personally reviewed following labs and imaging studies  CBC: Recent Labs  Lab 02/12/20 0552 02/16/20 1413  WBC 8.7 9.5  HGB 13.0 13.6  HCT 39.0 41.2  MCV 95.4 93.2  PLT 369 354   Basic Metabolic Panel: Recent Labs  Lab 02/13/20 0417 02/14/20 0443 02/15/20 0610 02/16/20 1413 02/17/20 0651  NA 147* 148* 142 139 136  K 3.5 3.7 3.5 3.8 4.8  CL 114* 115* 108 104 105  CO2 25 26 23 25 23   GLUCOSE 79 104* 105* 130* 80  BUN 8 10 9 8 9   CREATININE 0.70 0.72 0.65 0.56* 0.67  CALCIUM 8.5* 8.6* 8.8* 8.8* 8.8*  MG 1.8 1.9 1.9 1.8 1.8  PHOS 3.0 3.6 3.1 4.2 3.8   GFR: Estimated Creatinine Clearance: 93.3 mL/min (by C-G formula based on SCr of 0.67 mg/dL). Liver Function Tests: Recent Labs  Lab 02/12/20 0552  AST 78*  ALT 37  ALKPHOS 142*  BILITOT 0.8  PROT 5.6*  ALBUMIN 1.9*   No results for input(s): LIPASE, AMYLASE in the last 168 hours. No results for input(s): AMMONIA in the last 168 hours. Coagulation Profile: Recent Labs  Lab 02/15/20 1054  INR 1.2   Cardiac Enzymes: No results for input(s): CKTOTAL, CKMB, CKMBINDEX, TROPONINI in the  last 168 hours. BNP (last 3 results) No results for input(s): PROBNP in the last 8760 hours. HbA1C: No results for input(s): HGBA1C in the last 72 hours. CBG: No  results for input(s): GLUCAP in the last 168 hours. Lipid Profile: No results for input(s): CHOL, HDL, LDLCALC, TRIG, CHOLHDL, LDLDIRECT in the last 72 hours. Thyroid Function Tests: No results for input(s): TSH, T4TOTAL, FREET4, T3FREE, THYROIDAB in the last 72 hours. Anemia Panel: No results for input(s): VITAMINB12, FOLATE, FERRITIN, TIBC, IRON, RETICCTPCT in the last 72 hours. Sepsis Labs: No results for input(s): PROCALCITON, LATICACIDVEN in the last 168 hours.  No results found for this or any previous visit (from the past 240 hour(s)).    Imaging Studies   No results found.   Medications   Scheduled Meds: . amLODipine  5 mg Oral Daily  . vitamin C  500 mg Oral Daily  . aspirin EC  81 mg Oral Daily  . dextromethorphan-guaiFENesin  1 tablet Oral BID  . enoxaparin (LOVENOX) injection  40 mg Subcutaneous Q24H  . magnesium oxide  400 mg Oral BID  . metoprolol tartrate  25 mg Oral BID  . multivitamin with minerals  1 tablet Oral Daily  . nicotine  21 mg Transdermal Daily  . potassium chloride  20 mEq Oral Daily  . senna-docusate  1 tablet Oral QHS  . zinc sulfate  220 mg Oral Daily   Continuous Infusions:      LOS: 19 days    Time spent: 25 minutes with greater than 50% spent at bedside and in coordination of care.    Pennie Banter, DO [facility]  02/17/2020, 6:21 PM    If 7PM-7AM, please contact night-coverage. How to contact the [facility] Attending or Consulting provider 7A - 7P or covering provider during after hours 7P -7A, for this patient?    1. Check the care team in [facility] and look for a) attending/consulting TRH provider listed and b) the [facility] team listed 2. Log into [email or web address] and use [facility]'s universal password to access. If you do not have the password, please contact the  hospital operator. 3. Locate the [facility] provider you are looking for under [facility] and page to a number that you can be directly reached. 4. If you still have difficulty reaching the provider, please page the [facility] (Director on Call) for the Hospitalists listed on amion for assistance.

## 2020-02-17 NOTE — Progress Notes (Signed)
Physical Therapy Treatment Patient Details Name: CALIBER LANDESS MRN: [id number] DOB: 1955/10/31 Today's Date: 02/17/2020    History of Present Illness Zannie D Tiso is a 65 y.o. male with medical history significant of alcohol abuse, tobacco abuse, alcoholic pancreatitis, CAD, myocardial infarction, atrial fibrillation not on anticoagulants, who presented to the ED on 01/28/2020 with 10 days of cough and body aches, in addition to progressive abdominal pain with nausea vomiting since the day before.     Evaluation in the ED revealed acute pancreatitis with lipase 518.  COVID-19 positive.  Liver enzymes elevated.  Chest x-ray showed acute bronchitic changes.ISAIAH CIANCI is a 65 y.o. male with medical history significant of alcohol abuse, tobacco abuse, alcoholic pancreatitis, CAD, myocardial infarction, atrial fibrillation not on anticoagulants, who presented to the ED on 01/28/2020 with 10 days of cough and body aches, in addition to progressive abdominal pain with nausea vomiting since the day before. Evaluation in the ED revealed acute pancreatitis with lipase 518.  COVID-19 positive.  Liver enzymes elevated.  Chest x-ray showed acute bronchitic changes.    PT Comments    Pt was pleasant and motivated to participate during the session.  Pt was able to complete sup to sit without physical assistance but did require min A for stability with both transfers and gait.  Pt required frequent cuing for amb closer to the RW with upright posture and for increased step height and length.  Pt fatigued quickly during gait training and required seated rest breaks after each 4 foot walk.  Pt will benefit from PT services in a SNF setting upon discharge to safely address deficits listed in patient problem list for decreased caregiver assistance and eventual return to PLOF.     Follow Up Recommendations  SNF     Equipment Recommendations  Other (comment) (TBD at next venue of care)    Recommendations for  Other Services       Precautions / Restrictions Precautions Precautions: Fall Restrictions Weight Bearing Restrictions: No    Mobility  Bed Mobility Overal bed mobility: Modified Independent Bed Mobility: Supine to Sit           General bed mobility comments: Extra time and effort and use of bed rail to complete sup to sit but no assist needed  Transfers Overall transfer level: Needs assistance Equipment used: Rolling [place] (2 wheeled) Transfers: Sit to/from Stand Sit to Stand: Min assist         General transfer comment: Min A for stability and mod verbal cues for sequencing  Ambulation/Gait Ambulation/Gait assistance: Min assist Gait Distance (Feet): 4 Feet x 3 Assistive device: Rolling [place]hlman (2 wheeled) Gait Pattern/deviations: Decreased step length - right;Decreased step length - left;Shuffle;Trunk flexed Gait velocity: decreased   General Gait Details: Max verbal cues for amb closer to the RW with upright posture; min A for stability with pt only able to amb a max of 4 feet before pt required sitting rest break secondary to fatigue   Stairs             Wheelchair Mobility    Modified Rankin (Stroke Patients Only)       Balance Overall balance assessment: Needs assistance Sitting-balance support: Feet supported Sitting balance-Leahy Scale: Good     Standing balance support: Bilateral upper extremity supported;During functional activity Standing balance-Leahy Scale: Poor Standing balance comment: Occasional min A for stability in standing  Cognition Arousal/Alertness: Awake/alert Behavior During Therapy: Flat affect Overall Cognitive Status: No family/caregiver present to determine baseline cognitive functioning                                        Exercises Total Joint Exercises Ankle Circles/Pumps: AROM;Strengthening;Both;10 reps Quad Sets: Strengthening;Both;10 reps Hip  ABduction/ADduction: AROM;Strengthening;Both;5 reps Straight Leg Raises: AROM;Strengthening;Both;5 reps Long Arc Quad: AROM;Strengthening;Both;10 reps Marching in Standing: AROM;Strengthening;Both;5 reps;Standing Other Exercises Other Exercises: Multiple sit to/from stand transfer training from various height surfaces    General Comments        Pertinent Vitals/Pain Pain Assessment: 0-10 Pain Score: 5  Pain Location: B hands Pain Descriptors / Indicators: Sore Pain Intervention(s): Monitored during session;Patient requesting pain meds-RN notified    Home Living                      Prior Function            PT Goals (current goals can now be found in the care plan section) Progress towards PT goals: Progressing toward goals    Frequency    Min 2X/week      PT Plan Current plan remains appropriate    Co-evaluation              AM-PAC PT "6 Clicks" Mobility   Outcome Measure  Help needed turning from your back to your side while in a flat bed without using bedrails?: A Little Help needed moving from lying on your back to sitting on the side of a flat bed without using bedrails?: A Little Help needed moving to and from a bed to a chair (including a wheelchair)?: A Little Help needed standing up from a chair using your arms (e.g., wheelchair or bedside chair)?: A Little Help needed to walk in hospital room?: A Lot Help needed climbing 3-5 steps with a railing? : Total 6 Click Score: 15    End of Session Equipment Utilized During Treatment: Gait belt Activity Tolerance: Patient tolerated treatment well Patient left: in chair;with call bell/phone within reach;with nursing/sitter in room;Other (comment) Psychiatrist with pt) Nurse Communication: Mobility status PT Visit Diagnosis: Unsteadiness on feet (R26.81);Other abnormalities of gait and mobility (R26.89);Muscle weakness (generalized) (M62.81);Difficulty in walking, not elsewhere classified (R26.2)      Time: 7262-0355 PT Time Calculation (min) (ACUTE ONLY): 24 min  Charges:  $Gait Training: 8-22 mins $Therapeutic Exercise: 8-22 mins                    D. Scott Edin Kon PT, DPT 02/17/20, 10:23 AM

## 2020-02-17 NOTE — TOC Progression Note (Signed)
Transition of Care [facility]) - Progression Note    Patient Details  Name: Thomas Coffey MRN: [id number] Date of Birth: 05/20/1955  [facility] of [facility] CM/SW Contact  Trenton Founds, RN Phone Number: 02/17/2020, 1:46 PM  Clinical Narrative:   Patient still does not have any SNF bed offers at this time. In general facilities require at least 24 if not 48 hours sitter free.     Expected Discharge Plan: Home w Home Health Services Barriers to Discharge: Continued Medical Work up  Expected Discharge Plan and Services Expected Discharge Plan: Home w Home Health Services       Living arrangements for the past 2 months: Single Family Home                                       Social Determinants of Health (SDOH) Interventions    Readmission Risk Interventions No flowsheet data found.

## 2020-02-17 NOTE — Progress Notes (Addendum)
Nutrition Follow-up  DOCUMENTATION CODES:   Severe malnutrition in context of social or environmental circumstances  INTERVENTION:  Agree with initiation of another 48 hour calorie count as patient's PO intake has now improved. Hung envelope in room and educated Retail banker how to complete calorie count. RD will follow-up to document results of calorie count.  Continue Honey Thick Mighty Shake po TID with meals, each supplement provides 200 kcal and 7 grams of protein.  Continue Magic cup po TID with meals, each supplement provides 290 kcal and 9 grams of protein.  Continue MVI po daily.  NUTRITION DIAGNOSIS:   Severe Malnutrition related to social / environmental circumstances (EtOH abuse, suspected inadequate oral intake) as evidenced by severe fat depletion,severe muscle depletion.  Ongoing.  GOAL:   Patient will meet greater than or equal to 90% of their needs  Progressing.  MONITOR:   PO intake,Supplement acceptance,Labs,Weight trends,Skin,I & O's  REASON FOR ASSESSMENT:   NPO/Clear Liquid Diet,Consult Assessment of nutrition requirement/status,Poor PO,Calorie Count  ASSESSMENT:   65 year old male with PMHx of EtOH abuse, tobacco abuse, alcoholic pancreatitis, CAD, MI, A-fib not on anticoagulants admitted with VHQIO-96, acute metabolic encephalopathy most likely due to EtOH withdrawal, aspiration PNA, acute pancreatitis now resolved.   1/24 pt made NPO 1/26 diet advanced to dysphagia 1 with honey-thick 1/27 pt made NPO 1/28 diet advanced back to dysphagia 1 with honey-thick  Met with patient at bedside. Patient is significantly more alert today than on last assessment by this RD. He was sitting up in bed and feeding himself breakfast. He had finished most of the tray and was eating [employer]. He also had finished about 50% of the honey-thick Mighty Shake supplement. When calorie count was completed last week patient had very poor PO intake (though was more  lethargic) and feeding tube was recommended at that time. RD now re-consulted and MD has ordered calorie count. Discussed with RN. On Sunday patient did not eat well but yesterday patient ate well at meals and can now feed himself, which is new.  Medications reviewed and include: vitamin C 500 mg daily, magnesium oxide 400 mg BID, MVI daily, nicotine patch, senna-docusate 1 tablet QHS, zinc sulfate 220 grams daily.  Labs reviewed.  I/O: 1350 mL UOP yesterday (0.8 mL/kg/hr)  Weight trend: 74.8 kg on 1/28; +1.8 kg from 1/21  Diet Order:   Diet Order            DIET - DYS 1 Room service appropriate? Yes; Fluid consistency: Honey Thick  Diet effective now                EDUCATION NEEDS:   No education needs have been identified at this time  Skin:  Skin Assessment: Reviewed RN Assessment  Last BM:  02/17/2020 per chart  Height:   Ht Readings from Last 1 Encounters:  01/28/20 5' 9"  (1.753 m)   Weight:   Wt Readings from Last 1 Encounters:  02/13/20 74.8 kg   Ideal Body Weight:  72.7 kg  BMI:  Body mass index is 24.37 kg/m.  Estimated Nutritional Needs:   Kcal:  1800-2000  Protein:  90-100 grams  Fluid:  1.8-2 L/day  Jacklynn Barnacle, MS, RD, LDN Pager number available on Amion

## 2020-02-17 NOTE — Progress Notes (Signed)
Speech Language Pathology Treatment: Dysphagia  Patient Details Name: Thomas Coffey MRN: [id number] DOB: 1955/06/18 Today's Date: 02/17/2020 Time: 0762-2633 SLP Time Calculation (min) (ACUTE ONLY): 50 min  Assessment / Plan / Recommendation Clinical Impression  Pt seen for ongoing assessment of swallowing. He continues to attempt an oral diet; recently placed back on a Pureed diet w/ Honey consistency liquids w/ aspiration precautions. Pt's FTT and presentation of waxing and waning continues to be an issue for him and prevents adequate oral intake daily. Today, he presents as lying in bed w/ covers over his head; Max verbal/tactile cues and requests given to encourage pt to sit up in the bed and engage w/ SLP to take TSP trials of po's. He was verbally responsive w/ mumbled speech and did not followalong w/instructions consistently -- seemed disengaged. He stated 2x "I just want to rest; let me sleep". Sitter stated pt had been in a chair earlier but immediately wanted to go back to bed. Sitter also stated pt ate some of his breakfast meal when sitting up this morning but refused any support or assistance w/ it from her. He needs cues, and at times hands on, to follow aspiration precautions for SMALL sips SLOWLY. Pt is on RA; wbc wnl.Poor insight and awareness of self in bed. Less phlegm noted today; NSG denied need for recent suctioning. MD/NSG and Palliative Care have noted pt's waxing and waning of temperament and engagement often not eating/drinking at meals. A Calorie Count is ongoing.  Pt explained general aspiration precautions and need for following them especially sitting upright for all oral intake; small bites/sips. Pt FULLY assisted w/ positioning d/tpoor insight and overallweakness then given TSP trials of Nectar liquids. A Delayed throat clear noted w/ TSP trials of Nectar liquids(given slowly, small) x1 post all trials followed by min Phlegm expectorated. However, No  immediate/delayed,overt clinical s/s of aspiration were noted and no decline in respiratory status or mumbled vocal quality clear b/t trials, just low volume.Suspect pt is at risk to present w/ delayed pharyngeal swallow initiation d/t overall Cognitive decline and lack of engagement d/t pt requiring/given Mod+ verbal/tactile cues to follow through w/ more immediate swallows vs orally holding boluses(slow A-P transfer noted during trials).Pt was fed by TSP to monitor/control bolus size for better oropharyngeal control. Oral phasedeficits noted c/b lengthier oral phase timefor bolus management and A-P transfer for swallowing intermittently; oral clearing achieved w/ all consistenciesgiven Time and Cues. Eyes remained closed the entire session.  This presentation is not conducive to safe oral intake nor to oral intake that would adequately meet nutrition/hydration needs. Post discussion w/ MD and Palliative Care, recommendcontinue Dysphagia level1diet (puree) w/ gravies added to moisten foods;Honeyliquids for better oropharyngeal control and awareness overall to reduce risk for aspiration. Recommend aspiration precautions; PillsCrushedin Puree; tray setup,positioning, andassistancefeeding atmeals. Stop feeding po's if increased coughing, s/s of aspiration noted. MD will f/u w/ option of PEG placement in order to meet nutrition/hydration needs safely d/t pt's waxing and waning engagement in ADLs including po intake. ST services will continue to f/u w/ pt for toleration of diet and education as needed while admitted; education w/ family. Trials to upgrade diet only when appropriate from a Cognitive status standpoint. Team agreed. Precautions posted at bedside.    HPI HPI: Pt is a 65 y.o. male with medical history significant of alcohol abuse, tobacco abuse, alcoholic pancreatitis, CAD, myocardial infarction, atrial fibrillation not on anticoagulants, who presents with cough, abdominal pain.   COVID-19 positive.  Liver enzymes  elevated currently. Chest x-ray showed acute bronchitic changes.  On CIWA protocol with as needed Ativan -- reduce Librium to 25 mg TID to reduce need for Ativan per MD notes.  Pt requires a Sitter d/t agitation and behavior.      SLP Plan  Continue with current plan of care;Consult other service (comment) (when pt is ready to engage)       Recommendations  Diet recommendations: Dysphagia 1 (puree);Honey-thick liquid Liquids provided via: Teaspoon;Cup (limit multiple sips) Medication Administration: Crushed with puree (for safer swallowing) Supervision: Full supervision/cueing for compensatory strategies;Staff to assist with self feeding Compensations: Minimize environmental distractions;Slow rate;Small sips/bites;Lingual sweep for clearance of pocketing;Multiple dry swallows after each bite/sip;Follow solids with liquid Postural Changes and/or Swallow Maneuvers: Seated upright 90 degrees;Upright 30-60 min after meal                General recommendations:  (Palliative Care f/u for GOC; Dietician f/u) Oral Care Recommendations: Oral care BID;Oral care before and after PO;Staff/trained caregiver to provide oral care Follow up Recommendations: Skilled Nursing facility (TBD) SLP Visit Diagnosis: Dysphagia, oropharyngeal phase (R13.12) Plan: Continue with current plan of care;Consult other service (comment) (when pt is ready to engage)       GO                 Jerilynn Som, MS, CCC-SLP Speech Language Pathologist Rehab Services [phone] [facility] 02/17/2020, 1:09 PM

## 2020-02-18 ENCOUNTER — Inpatient Hospital Stay: Payer: Medicaid Other

## 2020-02-18 DIAGNOSIS — J069 Acute upper respiratory infection, unspecified: Secondary | ICD-10-CM | POA: Diagnosis not present

## 2020-02-18 DIAGNOSIS — U071 COVID-19: Secondary | ICD-10-CM | POA: Diagnosis not present

## 2020-02-18 NOTE — Evaluation (Addendum)
Objective Swallowing Evaluation: Type of Study: MBS-Modified Barium Swallow Study   Patient Details  Name: Thomas Coffey MRN: [id number] Date of Birth: February 25, 1955  Today's Date: 02/18/2020 Time: SLP Start Time (ACUTE ONLY): 1435 -SLP Stop Time (ACUTE ONLY): 1535  SLP Time Calculation (min) (ACUTE ONLY): 60 min   Past Medical History:  Past Medical History:  Diagnosis Date  . MI (myocardial infarction) [facility])    Past Surgical History: History reviewed. No pertinent surgical history. HPI: Pt is a 65 y.o. male with medical history significant of alcohol abuse, tobacco abuse, alcoholic pancreatitis, CAD, myocardial infarction, atrial fibrillation not on anticoagulants, who presents with cough, abdominal pain.  COVID-19 positive.  Liver enzymes elevated currently. Chest x-ray showed acute bronchitic changes.  On CIWA protocol with as needed Ativan -- reduce Librium to 25 mg TID to reduce need for Ativan per MD notes.  Pt requires a Sitter d/t agitation and behavior.   Subjective: pt awake, verbally conversed w/ SLP - min guarded at first. he followed instructions w/ cues.    Assessment / Plan / Recommendation  CHL IP CLINICAL IMPRESSIONS 02/18/2020  Clinical Impression Pt appears to present w/ Mild-Mod oropharyngeal phase dysphagia; pharyngeal phase impacted by Min delay in pharyngeal swallow initiation as well as decreased peristalsis resulting in pharyngeal residue.NO immediate, overt aspiration or penetration of po trials was noted to occur during this study. During the oral phase, min+ disorganized bolus management and control during bolus propulsion for A-P transfer occurred w/ trials; slight premature spillage of thin liquids into the pharynx w/ a Larger bolus size trials occurred. When pt took smaller size bolus sips, improved bolus control and timely/cohesive transfer of liquids into the pharynx was noted. Pt tended to take multiple sips in a row. Min. lengthier amount of time for  mashing/gumming w/ the increased textured foods occurred; much more timely management of puree trials. Oral clearing achieved w/ all trial consistencies given Time. During the pharyngeal phase, grossly timely pharyngeal swallow initiation noted w/ purees and minced trial; thin liquids tended to trigger the pharyngeal swallow at the level of the Valleculae w/ Min+ spillage from the Valleculae to the Pyriform Sinuses during Single sip -- pt maintained airway closure during multiple sipping w/ No aspiration or penetration appearing to occur; airway closure appeared adequate as the swallows continued. However, increased pharyngeal residue remained post initial swallows; Min-Mod diffuse pharyngeal and BOT residue noted which pt was able to reduce w/ a f/u, Dry swallow but was Not able to clear. Decreased laryngeal excursion and pharyngeal pressure during the swallow was noted. During the Esophageal phase, min slower Cervical Esophageal clearing occurred below the UES -- Retrograde activity of a slight amount of bolus material from the Esophagus to the Pyriform Sinuses occurred intermittently. Dry swallows b/t trials appeared to reduce and/or clear both pharyngeal and Esophageal residue. This could increase risk for aspiration of retrograde material after the swallows. Suspect this issue is related to pt's ETOH abuse.  Based on this MBSS, a pureed foods diet w/ thin liquids by Cup is recommended w/ aspiration precautions and Supervision at this time. Pt was instructed on use of a f/u, DRY swallowing post bites/sips to reduce remaining pharyngeal residue. Pills in Puree for safer swallowing. Reduce distractions during meal and give Time b/t bites/sips for Esophageal phase clearing also. ST services will f/u w/ ongoing education on precautions and toleration of diet next 2-3 days.  MD and Team consulted re: results and pt's request to have an oral diet  w/ thin liquids. 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 MD and Team agreed w/ trial of pureed diet w/ thin liquids Via Cup w/ Supervision and precautions at this time. Precautions posted in room. Pt informed. MD and NSG to monitor for any negative sequelae of aspiration and adjust POC accordingly. ST services can be available for further needs as indicated; education of MBSS results. Again, pt is at risk for aspiration both during and AFTER the swallow d/t pharyngeal residue and any impact from Esophageal phase dysmotility -- ANY retrograde activity can increase risk for aspiration of REFLUX, Retrograde bolus movement. MD agreed.  SLP Visit Diagnosis Dysphagia, oropharyngeal phase (R13.12)  Attention and concentration deficit following --  Frontal lobe and executive function deficit following --  Impact on safety and function Mild aspiration risk;Risk for inadequate nutrition/hydration      CHL IP TREATMENT RECOMMENDATION 02/18/2020  Treatment Recommendations Therapy as outlined in treatment plan below     Prognosis 02/18/2020  Prognosis for Safe Diet Advancement Guarded  Barriers to Reach Goals Cognitive deficits;Time post onset;Severity of deficits;Behavior  Barriers/Prognosis Comment --    CHL IP DIET RECOMMENDATION 02/18/2020  SLP Diet Recommendations Dysphagia 1 (Puree) solids;Thin liquid  Liquid Administration via Cup;No straw  Medication Administration Whole meds with puree  Compensations Minimize environmental distractions;Slow rate;Small sips/bites;Lingual sweep for clearance of pocketing;Multiple dry swallows after each bite/sip;Follow solids with liquid  Postural Changes Remain semi-upright after after feeds/meals (Comment);Seated upright  at 90 degrees      CHL IP OTHER RECOMMENDATIONS 02/18/2020  Recommended Consults (No Data)  Oral Care Recommendations Oral care BID;Oral care before and after PO;Staff/trained caregiver to provide oral care  Other Recommendations (No Data)      CHL IP FOLLOW UP RECOMMENDATIONS 02/18/2020  Follow up Recommendations Skilled Nursing facility      [facility] IP FREQUENCY AND DURATION 02/18/2020  Speech Therapy Frequency (ACUTE ONLY) min 2x/week  Treatment Duration 1 week           CHL IP ORAL PHASE 02/18/2020  Oral Phase Impaired  Oral - Pudding Teaspoon --  Oral - Pudding Cup --  Oral - Honey Teaspoon --  Oral - Honey Cup NT  Oral - Nectar Teaspoon 2  Oral - Nectar Cup 2  Oral - Nectar Straw --  Oral - Thin Teaspoon --  Oral - Thin Cup 8-10 w/ mult. sips  Oral - Thin Straw --  Oral - Puree 3  Oral - Mech Soft 1  Oral - Regular --  Oral - Multi-Consistency --  Oral - Pill --  Oral Phase - Comment --    CHL IP PHARYNGEAL PHASE 02/18/2020  Pharyngeal Phase Impaired  Pharyngeal- Pudding Teaspoon --  Pharyngeal --  Pharyngeal- Pudding Cup --  Pharyngeal --  Pharyngeal- Honey Teaspoon --  Pharyngeal --  Pharyngeal- Honey Cup NT  Pharyngeal --  Pharyngeal- Nectar Teaspoon 2  Pharyngeal --  Pharyngeal- Nectar Cup 2  Pharyngeal --  Pharyngeal- Nectar Straw --  Pharyngeal --  Pharyngeal- Thin Teaspoon --  Pharyngeal --  Pharyngeal- Thin Cup 8-10 w/ mult. sips  Pharyngeal --  Pharyngeal- Thin Straw --  Pharyngeal --  Pharyngeal- Puree 3  Pharyngeal --  Pharyngeal- Mechanical Soft 1  Pharyngeal --  Pharyngeal- Regular --  Pharyngeal --  Pharyngeal- Multi-consistency --  Pharyngeal --  Pharyngeal- Pill --  Pharyngeal --  Pharyngeal Comment --     CHL IP CERVICAL ESOPHAGEAL PHASE 02/18/2020  Cervical Esophageal Phase Impaired  Pudding Teaspoon --  Pudding Cup --  Honey Teaspoon --  Honey Cup NT  Nectar Teaspoon 2  Nectar Cup 2  Nectar Straw --  Thin Teaspoon --  Thin  Cup 8-10 w/ mult. sips  Thin Straw --  Puree 3  Mechanical Soft 1  Regular --  Multi-consistency --  Pill --  Cervical Esophageal Comment --          Jerilynn Som, MS, CCC-SLP Speech Language Pathologist Rehab Services [phone] [facility] 02/18/2020, 4:41 PM

## 2020-02-18 NOTE — Progress Notes (Signed)
PROGRESS NOTE    ANAND CHARITY   WUX:[id number]  DOB: 09-26-1955  PCP: Patient, [name]    DOA: 01/28/2020 LOS: 20   Brief Narrative   Thomas Coffey is a 65 y.o. male with medical history significant of alcohol abuse, tobacco abuse, alcoholic pancreatitis, CAD, myocardial infarction, atrial fibrillation not on anticoagulants, who presented to the ED on 01/28/2020 with 10 days of cough and body aches, in addition to progressive abdominal pain with nausea vomiting since the day before.  Evaluation in the ED revealed acute pancreatitis with lipase 518.  COVID-19 positive.  Liver enzymes elevated.  Chest x-ray showed acute bronchitic changes.  Patient has had issues with dysphagia, and made NPO on multiple occasions.  This seems secondary to his encephalopathy.  SLP consulted.  Currently tolerating dysphagia 1 diet.   Has been off CIWA and Ativan and doing well.  Started on PRN Vistaril for anxiety/agitation as pt continues to try to get out of the bed.  He has mostly been redirectable by staff for the past few days.  Hospitalization prolonged due to difficult disposition.  Pt requires SNF placement, is profoundly weak.  Palliative discussions on GOC with family are ongoing.  May get PEG tube.     Assessment & Plan   Principal Problem:   Acute respiratory disease due to COVID-19 virus Active Problems:   Alcohol abuse   Hypomagnesemia   Acute pancreatitis   Abnormal LFTs   CAD (coronary artery disease)   Atrial fibrillation, chronic (HCC)   Tobacco abuse   COVID-19   Failure to thrive in adult   Acute metabolic encephalopathy -persistent hypoactive delirium. --after prolonged course of alcohol withdrawal.  CT head and MRI brain negative for any acute findings. Appears consistent with hypoactive delirium.   --Started PRN Vistaril for anxiety/agitation, only use if pt not redirectable --Ativan/CIWA discontinued --Stop bedtime gabapentin --Avoid sedating meds as much  as possible --Delirium precautions  Alcohol abuse / Uncomplicated Alcohol Withdrawal -resolved Son reports pt very heavy drinker, all day every day, both beer and liquor for entire adult life. He was not aware of prior withdrawal or if hx of seizure or DT's --Tapered off Librium, completed on 1/23 -- Off CIWA since 1/27 -- stopped gabapentin  Aspiration pneumonia-resolved. Dysphagia  --completed 5 days course of Unasyn on 1/23 - resumed again on dysphagia 1 / pureed diet honey thick liquids 1/28, doing fairly well.   Swallow function waxes and wanes with his level of alertness. --DG swallow eval today showed no overt aspiration.   --Pt has been eating better and feeding himself Plan: --Dys 1 diet with thin liquids -- Aspiration precautions --SLP following --Hold PEG tube placement for now   Inadequate p.o. intake  Severe Malnutrition - seems acute on chronic issue based his malnutrition.   --Palliative care following, goals of care discussions ongoing.  Should start tube feeds if continuing full scope of care.  Family requesting PEG tube.   GI consulted. --48 hour Calorie count ordered --Pt has been eating better and feeding himself Plan: --Dys 1 diet with thin liquids --nutrition following --Hold PEG tube placement for now  Hypoglycemia -due to poor p.o. intake.  Treated with D5W. --stop D5w and monitor, pt eating little better  Hypokalemia  / Hypomagnesemia / Hypophosphatemia  --monitor and replete PRN  COVID-19 infection -presented with 10-day history of body aches and cough, tested positive in the ED.  Has not been hypoxic.  Chest x-ray showed bronchitic changes. --Completed  remdesivir, Steroids started on admission were stopped given no hypoxia -- Scheduled Mucinex, as needed albuterol -- Vitamin C, zinc -- Completed 5 day course doxycycline for secondary bronchitis  Acute pancreatitis due to alcohol abuse, Present on admission, resolved -- Diet as  tolerated -- As needed Zofran  Sinus tachycardia - Resolved.   Due to Etoh withdrawal most likely.   --cont metop  Hypertension - Improved.   Normotensive recently. Was likely due to EtOH withdrawal.  --cont amlodipine and metop  Tobacco abuse  -nicotine patch.  Smoking cessation recommended.  Transaminitis -likely due to alcohol abuse.  Monitor CMP closely since on remdesivir. RUQ ultrasound showed likely hepatic steatosis, gall stones.  CAD -chronic, stable without active chest pain or acute ischemic changes on EKG.   --cont ASA 81  A. fib, chronic - Has been normal sinus on tele, was tachycardic related to alcohol withdrawal, now rate controlled for most part.   --Not on anticoagulation or rate control meds --cont metop    DVT prophylaxis: enoxaparin (LOVENOX) injection 40 mg Start: 01/28/20 2200   Diet:  Diet Orders (From admission, onward)    Start     Ordered   02/13/20 1813  DIET - DYS 1 Room service appropriate? Yes; Fluid consistency: Honey Thick  Diet effective now       Question Answer Comment  Room service appropriate? Yes   Fluid consistency: Honey Thick      02/13/20 1812            Code Status: Full Code    Subjective 02/18/20    Pt has been eating better, feeding himself.  Still needs a sitter to keep him from getting out of bed.   DG swallow eval showed no overt aspiration.  Pt was eager to get up and walk with PT.   Disposition Plan & Communication   DVT prophylaxis: Lovenox SQ Code Status: Full code  Family Communication:  Status is: inpatient Dispo:   The patient is from: home Anticipated d/c is to: SNF Anticipated d/c date is: 1-2 days Patient currently is not medically stable to d/c due to: need a few more days of good oral intake to ensure no PEG needs.    Consults, Procedures, Significant Events   Consultants:   Palliative care  Gastroenterology  Procedures:   None  Antimicrobials:  Anti-infectives (From  admission, onward)   Start     Dose/Rate Route Frequency Ordered Stop   02/04/20 2200  Ampicillin-Sulbactam (UNASYN) 3 g in sodium chloride 0.9 % 100 mL IVPB        3 g 200 mL/hr over 30 Minutes Intravenous Every 6 hours 02/04/20 1320 02/07/20 2359   02/03/20 1015  amoxicillin-clavulanate (AUGMENTIN) 875-125 MG per tablet 1 tablet  Status:  Discontinued        1 tablet Oral Every 12 hours 02/03/20 0929 02/04/20 1320   01/29/20 1000  azithromycin (ZITHROMAX) tablet 250 mg  Status:  Discontinued       "Followed by" Linked Group Details   250 mg Oral Daily 01/28/20 1010 01/28/20 1025   01/29/20 1000  remdesivir 100 mg in sodium chloride 0.9 % 100 mL IVPB       "Followed by" Linked Group Details   100 mg 200 mL/hr over 30 Minutes Intravenous Daily 01/28/20 1024 02/02/20 0959   01/28/20 1200  remdesivir 200 mg in sodium chloride 0.9% 250 mL IVPB       "Followed by" Linked Group Details   200 mg 580  mL/hr over 30 Minutes Intravenous Once 01/28/20 1024 01/28/20 1318   01/28/20 1030  doxycycline (VIBRA-TABS) tablet 100 mg  Status:  Discontinued        100 mg Oral Every 12 hours 01/28/20 1025 02/01/20 1425   01/28/20 1015  azithromycin (ZITHROMAX) tablet 500 mg  Status:  Discontinued       "Followed by" Linked Group Details   500 mg Oral Daily 01/28/20 1010 01/28/20 1025        Objective   Vitals:   02/17/20 2021 02/18/20 0243 02/18/20 0744 02/18/20 1149  BP: 101/75 102/83 110/75 109/74  Pulse: 100 93 96 89  Resp: 18 17 16 15   Temp: 97.6 F (36.4 C) 98.3 F (36.8 C) 98.3 F (36.8 C) (!) 97.5 F (36.4 C)  TempSrc:  Oral    SpO2: 97% 98% 95% 93%  Weight:      Height:        Intake/Output Summary (Last 24 hours) at 02/18/2020 1429 Last data filed at 02/18/2020 1346 Gross per 24 hour  Intake 300 ml  Output 575 ml  Net -275 ml   Filed Weights   01/28/20 0532 02/06/20 0500 02/13/20 0500  Weight: 77.1 kg 73 kg 74.8 kg    Physical Exam:  Constitutional: NAD, alert,  responding HEENT: conjunctivae and lids normal, EOMI CV: No cyanosis.   RESP: normal respiratory effort, on RA Extremities: No effusions, edema in BLE SKIN: warm, dry Neuro: II - XII grossly intact.   Psych: Normal mood and affect.     Labs   Data Reviewed: I have personally reviewed following labs and imaging studies  CBC: Recent Labs  Lab 02/12/20 0552 02/16/20 1413  WBC 8.7 9.5  HGB 13.0 13.6  HCT 39.0 41.2  MCV 95.4 93.2  PLT 369 354   Basic Metabolic Panel: Recent Labs  Lab 02/13/20 0417 02/14/20 0443 02/15/20 0610 02/16/20 1413 02/17/20 0651  NA 147* 148* 142 139 136  K 3.5 3.7 3.5 3.8 4.8  CL 114* 115* 108 104 105  CO2 25 26 23 25 23   GLUCOSE 79 104* 105* 130* 80  BUN 8 10 9 8 9   CREATININE 0.70 0.72 0.65 0.56* 0.67  CALCIUM 8.5* 8.6* 8.8* 8.8* 8.8*  MG 1.8 1.9 1.9 1.8 1.8  PHOS 3.0 3.6 3.1 4.2 3.8   GFR: Estimated Creatinine Clearance: 93.3 mL/min (by C-G formula based on SCr of 0.67 mg/dL). Liver Function Tests: Recent Labs  Lab 02/12/20 0552  AST 78*  ALT 37  ALKPHOS 142*  BILITOT 0.8  PROT 5.6*  ALBUMIN 1.9*   No results for input(s): LIPASE, AMYLASE in the last 168 hours. No results for input(s): AMMONIA in the last 168 hours. Coagulation Profile: Recent Labs  Lab 02/15/20 1054  INR 1.2   Cardiac Enzymes: No results for input(s): CKTOTAL, CKMB, CKMBINDEX, TROPONINI in the last 168 hours. BNP (last 3 results) No results for input(s): PROBNP in the last 8760 hours. HbA1C: No results for input(s): HGBA1C in the last 72 hours. CBG: No results for input(s): GLUCAP in the last 168 hours. Lipid Profile: No results for input(s): CHOL, HDL, LDLCALC, TRIG, CHOLHDL, LDLDIRECT in the last 72 hours. Thyroid Function Tests: No results for input(s): TSH, T4TOTAL, FREET4, T3FREE, THYROIDAB in the last 72 hours. Anemia Panel: No results for input(s): VITAMINB12, FOLATE, FERRITIN, TIBC, IRON, RETICCTPCT in the last 72 hours. Sepsis Labs: No  results for input(s): PROCALCITON, LATICACIDVEN in the last 168 hours.  No results found for this  or any previous visit (from the past 240 hour(s)).    Imaging Studies   No results found.   Medications   Scheduled Meds: . amLODipine  5 mg Oral Daily  . vitamin C  500 mg Oral Daily  . aspirin EC  81 mg Oral Daily  . dextromethorphan-guaiFENesin  1 tablet Oral BID  . enoxaparin (LOVENOX) injection  40 mg Subcutaneous Q24H  . magnesium oxide  400 mg Oral BID  . metoprolol tartrate  25 mg Oral BID  . multivitamin with minerals  1 tablet Oral Daily  . nicotine  21 mg Transdermal Daily  . potassium chloride  20 mEq Oral Daily  . senna-docusate  1 tablet Oral QHS  . zinc sulfate  220 mg Oral Daily   Continuous Infusions:     LOS: 20 days    Darlin Priestly, md [facility]  02/18/2020, 2:29 PM

## 2020-02-18 NOTE — Progress Notes (Signed)
Calorie Count Note  48 hour calorie count ordered. Day 1 results:  Diet: dysphagia 1 with honey-thick liquids Supplements: honey-thick Mighty Shake po TID with meals (200 kcal, 7 grams of protein), Magic cup TID with meals (290 kcal, 9 grams of protein)  Breakfast 2/1: 1011 kcal, 31 grams of protein (75% puree oatmeal, 75% puree pineapple, 50% Honey-Thick Mighty Shake, 100% puree eggs, 100% puree waffle with syrup, 100% nectar-thick orange juice, 100% Magic Cup) Lunch 2/1: 350 kcal, 9 grams of protein (100% honey-thick sweet tea, 100% Magic Cup) Dinner 2/1: 623 kcal, 33 grams of protein (100% puree pork with gravy, 90% mashed potatoes, 25% puree carrots, 25% honey thick sweet tea, 100% Magic Cup)  Total intake 2/1: 1984 kcal (100% of estimated needs)  73 grams of protein (81% of minimum estimated needs)  Estimated Nutritional Needs:  Kcal:  1800-2000 Protein:  90-100 grams Fluid:  1.8-2 L/day  Nutrition Dx: Severe Malnutrition related to social / environmental circumstances (EtOH abuse, suspected inadequate oral intake) as evidenced by severe fat depletion,severe muscle depletion.  Goal: Patient will meet greater than or equal to 90% of their needs  Intervention:  -Continue Honey Thick Mighty Shake po TID with meals, each supplement provides 200 kcal and 7 grams of protein. -Continue Magic cup po TID with meals, each supplement provides 290 kcal and 9 grams of protein. -Continue MVI po daily.  Of note RD did reach out to kitchen 2/1 and 2/2 regarding patient receiving nectar-thick items on trays. Able to speak with director today who is going to look into this.  Felix Pacini, MS, RD, LDN Pager number available on Amion

## 2020-02-18 NOTE — Plan of Care (Signed)

## 2020-02-19 LAB — CBC
HCT: 42.6 % (ref 39.0–52.0)
Hemoglobin: 13.9 g/dL (ref 13.0–17.0)
MCH: 30.9 pg (ref 26.0–34.0)
MCHC: 32.6 g/dL (ref 30.0–36.0)
MCV: 94.7 fL (ref 80.0–100.0)
Platelets: 349 10*3/uL (ref 150–400)
RBC: 4.5 MIL/uL (ref 4.22–5.81)
RDW: 13.2 % (ref 11.5–15.5)
WBC: 10.7 10*3/uL — ABNORMAL HIGH (ref 4.0–10.5)
nRBC: 0 % (ref 0.0–0.2)

## 2020-02-19 LAB — BASIC METABOLIC PANEL
Anion gap: 10 (ref 5–15)
BUN: 13 mg/dL (ref 8–23)
CO2: 24 mmol/L (ref 22–32)
Calcium: 9.1 mg/dL (ref 8.9–10.3)
Chloride: 105 mmol/L (ref 98–111)
Creatinine, Ser: 0.64 mg/dL (ref 0.61–1.24)
GFR, Estimated: >60 mL/min (ref >60)
Glucose, Bld: 112 mg/dL — ABNORMAL HIGH (ref 70–99)
Potassium: 4.4 mmol/L (ref 3.5–5.1)
Sodium: 139 mmol/L (ref 135–145)

## 2020-02-19 LAB — MAGNESIUM: Magnesium: 1.6 mg/dL — ABNORMAL LOW (ref 1.7–2.4)

## 2020-02-19 MED ORDER — BOOST / RESOURCE BREEZE PO LIQD CUSTOM: 1.0000 | Freq: Three times a day (TID) | ORAL | Status: DC

## 2020-02-19 MED ORDER — ENSURE ENLIVE PO LIQD: 237.0000 mL | Freq: Two times a day (BID) | ORAL | Status: DC

## 2020-02-19 MED ORDER — MAGNESIUM SULFATE 2 GM/50ML IV SOLN
2.0000 g | Freq: Once | INTRAVENOUS | Status: AC
  Administered 2020-02-19: 2 g via INTRAVENOUS
  Filled 2020-02-19: qty 50

## 2020-02-19 MED ORDER — BOOST / RESOURCE BREEZE PO LIQD CUSTOM
1.0000 | Freq: Two times a day (BID) | ORAL | Status: DC
  Administered 2020-02-20 – 2020-02-26 (×13): 1 via ORAL

## 2020-02-19 NOTE — Progress Notes (Addendum)
Daily Progress Note   Patient Name: Thomas Coffey       Date: 02/19/2020 DOB: 08-27-1955  Age: 65 y.o. MRN#: [id number] Attending Physician: Darlin Priestly, MD Primary Care Physician: Patient, [name] Admit Date: 01/28/2020  Reason for Consultation/Follow-up: Establishing goals of care  Subjective: Patient is resting in bed. Results of swallow evaluation noted yesterday and results of calorie count. Care team message noted this morning from nursing about swallowing concerns from night shift. Spoke with sons. They state they are confused as treatment plans are made and then there are changes, and they receive information from various staff members and providers. They state when they are there he has no swallowing issues or coughing, and he feeds himself. They state he does not like the food he is offered. They are aware his status and exams have waxed and waned. They discuss that perhaps it was a nighttime cough from smoking or other issue and would like to see if it reoccurs. Overall goal remains SNF placement.   Would recommend primary team working directly with patient and family on any further GOC to minimize any confusion. PMT team will shadow.   Length of Stay: 21  Current Medications: Scheduled Meds:  . amLODipine  5 mg Oral Daily  . vitamin C  500 mg Oral Daily  . aspirin EC  81 mg Oral Daily  . dextromethorphan-guaiFENesin  1 tablet Oral BID  . enoxaparin (LOVENOX) injection  40 mg Subcutaneous Q24H  . magnesium oxide  400 mg Oral BID  . metoprolol tartrate  25 mg Oral BID  . multivitamin with minerals  1 tablet Oral Daily  . nicotine  21 mg Transdermal Daily  . potassium chloride  20 mEq Oral Daily  . senna-docusate  1 tablet Oral QHS  . zinc sulfate  220 mg Oral Daily     Continuous Infusions:   PRN Meds: acetaminophen, albuterol, bisacodyl, hydrOXYzine, ipratropium, ondansetron (ZOFRAN) IV  Physical Exam Pulmonary:     Effort: Pulmonary effort is normal.  Neurological:     Mental Status: He is alert.             Vital Signs: BP 102/75 (BP Location: Right Arm)   Pulse 100   Temp 98.1 F (36.7 C) (Oral)   Resp 20   Ht 5\' 9"  (  1.753 m)   Wt 74.8 kg   SpO2 92% Comment: Room Air  BMI 24.37 kg/m  SpO2: SpO2: 92 % (Room Air) O2 Device: O2 Device: Room Air O2 Flow Rate:    Intake/output summary:   Intake/Output Summary (Last 24 hours) at 02/19/2020 1238 Last data filed at 02/18/2020 1555 Gross per 24 hour  Intake 60 ml  Output 100 ml  Net -40 ml   LBM: Last BM Date: 02/18/20 Baseline Weight: Weight: 77.1 kg Most recent weight: Weight: 74.8 kg        Flowsheet Rows   Flowsheet Row Most Recent Value  Intake Tab   Referral Department Hospitalist  Unit at Time of Referral Med/Surg Unit  Palliative Care Primary Diagnosis Sepsis/Infectious Disease  Date Notified 02/04/20  Palliative Care Type New Palliative care  Reason for referral Clarify Goals of Care  Date of Admission 01/28/20  Date first seen by Palliative Care 02/04/20  # of days Palliative referral response time 0 Day(s)  # of days IP prior to Palliative referral 7  Clinical Assessment   Psychosocial & Spiritual Assessment   Palliative Care Outcomes       Patient Active Problem List   Diagnosis Date Noted  . Failure to thrive in adult 02/13/2020  . COVID-19 01/29/2020  . COVID-19 virus infection 01/28/2020  . Abnormal LFTs 01/28/2020  . CAD (coronary artery disease) 01/28/2020  . Atrial fibrillation, chronic (HCC) 01/28/2020  . Acute respiratory disease due to COVID-19 virus 01/28/2020  . Tobacco abuse 01/28/2020  . Acute pancreatitis 07/20/2019  . Hypomagnesemia 11/24/2018  . Protein-calorie malnutrition, severe 11/23/2018  . Pleural effusion on left   . Liver  function test abnormality   . AKI (acute kidney injury) (HCC)   . Atrial fibrillation with rapid ventricular response (HCC)   . Acute hepatitis 11/17/2018  . Atrial fibrillation with RVR (HCC) 11/17/2018  . Alcohol abuse 11/17/2018  . Tobacco dependence syndrome 11/17/2018  . Empyema [facility])     Palliative Care Assessment & Plan    Recommendations/Plan:  Family's overall goal is SNF placement. Recommend primary team work directly with patient/family on any further GOC to help reduce confusion for the family.   Recommend palliative to follow at D/C.   Code Status:    Code Status Orders  (From admission, onward)         Start     Ordered   01/28/20 1203  Full code  Continuous        01/28/20 1202        Code Status History    Date Active Date Inactive Code Status Order ID Comments User Context   07/20/2019 0230 07/20/2019 1411 Full Code [id number]  [facility] Vernetta Honey, MD ED   11/11/2018 2355 11/25/2018 1856 Full Code [id number]  Houston Siren, MD Inpatient   Advance Care Planning Activity       Prognosis:   Unable to determine    Care plan was discussed with SLP, primary team.   Thank you for allowing the Palliative Medicine Team to assist in the care of this patient.   Total Time 25 min Prolonged Time Billed  no      Greater than 50%  of this time was spent counseling and coordinating care related to the above assessment and plan.  Morton Stall, NP  Please contact Palliative Medicine Team phone at [phone] for questions and concerns.

## 2020-02-19 NOTE — Progress Notes (Signed)
Physical Therapy Treatment Patient Details Name: Thomas Coffey MRN: [id number] DOB: 1955-04-04 Today's Date: 02/19/2020    History of Present Illness Thomas Coffey is a 65 y.o. male with medical history significant of alcohol abuse, tobacco abuse, alcoholic pancreatitis, CAD, myocardial infarction, atrial fibrillation not on anticoagulants, who presented to the ED on 01/28/2020 with 10 days of cough and body aches, in addition to progressive abdominal pain with nausea vomiting since the day before.     Evaluation in the ED revealed acute pancreatitis and COVID-19 positive. MD assessment includes: Acute metabolic encephalopathy, alcohol abuse, aspiration PNA now resolved, severe malnutrition, hypoglycemia, hypokalemia, Covid-19, acute pancreatitis, sinus tachycardia, HTN, and transaminitis.    PT Comments    Pt was pleasant and put forth good effort during the session.  Pt was Mod Ind with sup to sit and min A with transfers with verbal and tactile cues for hand placement.  Pt ambulated 1 x 12 feet and 1 x 5 feet with very slow cadence and flexed trunk posture with max cuing for upright posture and amb closer to the RW.  Pt required min A for stability and to complete turn to chair secondary to fatigue at end of each walk.  Pt will benefit from PT services in a SNF setting upon discharge to safely address deficits listed in patient problem list for decreased caregiver assistance and eventual return to PLOF.     Follow Up Recommendations  SNF     Equipment Recommendations  Other (comment) (TBD at next venue of care)    Recommendations for Other Services       Precautions / Restrictions Precautions Precautions: Fall Restrictions Weight Bearing Restrictions: No    Mobility  Bed Mobility Overal bed mobility: Modified Independent Bed Mobility: Supine to Sit           General bed mobility comments: Extra time and effort and use of bed rail to complete sup to sit but no assist  needed  Transfers Overall transfer level: Needs assistance Equipment used: Rolling Stansbery (2 wheeled) Transfers: Sit to/from Stand Sit to Stand: Min assist         General transfer comment: Min A for stability and mod verbal cues for sequencing most notably for hand placement  Ambulation/Gait Ambulation/Gait assistance: Min assist Gait Distance (Feet): 12 Feet x 1, 5 Feet x 1 Assistive device: Rolling Thueson (2 wheeled) Gait Pattern/deviations: Decreased step length - right;Decreased step length - left;Shuffle;Trunk flexed Gait velocity: decreased   General Gait Details: Max verbal cues for amb closer to the RW with upright posture; min A for stability with pt only able to amb a max of 12 feet before pt required sitting rest break secondary to fatigue.   Stairs             Wheelchair Mobility    Modified Rankin (Stroke Patients Only)       Balance Overall balance assessment: Needs assistance   Sitting balance-Leahy Scale: Good Sitting balance - Comments: supervision sitting EOB   Standing balance support: Bilateral upper extremity supported;During functional activity Standing balance-Leahy Scale: Poor Standing balance comment: Occasional min A for stability in standing                            Cognition Arousal/Alertness: Awake/alert Behavior During Therapy: Flat affect Overall Cognitive Status: No family/caregiver present to determine baseline cognitive functioning  Exercises Total Joint Exercises Ankle Circles/Pumps: AROM;Strengthening;Both;10 reps Quad Sets: Strengthening;Both;10 reps Towel Squeeze: Strengthening;Both;10 reps Hip ABduction/ADduction: AROM;Strengthening;Both;10 reps;AAROM Straight Leg Raises: AROM;Strengthening;Both;10 reps Long Arc Quad: AROM;Strengthening;Both;10 reps;15 reps Other Exercises Other Exercises: Multiple sit to/from stand transfer training from various  height surfaces    General Comments        Pertinent Vitals/Pain Pain Assessment: No/denies pain    Home Living                      Prior Function            PT Goals (current goals can now be found in the care plan section) Progress towards PT goals: Progressing toward goals    Frequency    Min 2X/week      PT Plan Current plan remains appropriate    Co-evaluation              AM-PAC PT "6 Clicks" Mobility   Outcome Measure  Help needed turning from your back to your side while in a flat bed without using bedrails?: A Little Help needed moving from lying on your back to sitting on the side of a flat bed without using bedrails?: A Little Help needed moving to and from a bed to a chair (including a wheelchair)?: A Little Help needed standing up from a chair using your arms (e.g., wheelchair or bedside chair)?: A Little Help needed to walk in hospital room?: A Little Help needed climbing 3-5 steps with a railing? : Total 6 Click Score: 16    End of Session Equipment Utilized During Treatment: Gait belt Activity Tolerance: Patient tolerated treatment well Patient left: in chair;with call bell/phone within reach;with nursing/sitter in room Nurse Communication: Mobility status PT Visit Diagnosis: Unsteadiness on feet (R26.81);Other abnormalities of gait and mobility (R26.89);Muscle weakness (generalized) (M62.81);Difficulty in walking, not elsewhere classified (R26.2)     Time: 1050-1120 PT Time Calculation (min) (ACUTE ONLY): 30 min  Charges:  $Gait Training: 8-22 mins $Therapeutic Exercise: 8-22 mins                     D. Scott Virgene Tirone PT, DPT 02/19/20, 11:47 AM

## 2020-02-19 NOTE — Progress Notes (Signed)
Calorie Count Note  48 hour calorie count ordered. Day 2 results:  Diet: dysphagia 1 with honey-thick liquids later changed to dysphagia 1 with thin liquids Supplements: honey-thick Mighty Shake po TID with meals (200 kcal, 7 grams of protein), Magic cup TID with meals (290 kcal, 9 grams of protein)  Breakfast 2/2: 500 kcal, 35 grams of protein (100% cream of wheat, 100% puree sausage, 50% Hormel Shake) Lunch 2/2: 359 kcal, 29 grams of protein (100% puree chicken with gravy, 25% puree mac and cheese, 50% puree broccoli, 25% honey thick sweet tea, 25% honey-thick mighty shake) Dinner 2/2: 284 kcal, 14 grams of protein (50% puree [place] with gravy, 75% mashed potatoes with gravy, 50% puree green beans) Supplements: 580, 18 grams (Magic Cup x 2)  Total intake: 1723 kcal (96% of minimum estimated needs)  96 grams of protein (100% of estimated needs)  Estimated Nutritional Needs: Kcal:1800-2000 Protein:90-100 grams Fluid:1.8-2 L/day  Nutrition Dx: Severe Malnutritionrelated to social / environmental circumstances (EtOH abuse, suspected inadequate oral intake)as evidenced by severe fat depletion,severe muscle depletion.  Goal: Patient will meet greater than or equal to 90% of their needs  Intervention:  -Will discontinue honey-thick Mighty Shake now that patient is on thin liquids. -Provide Ensure Enlive po BID, each supplement provides 350 kcal and 20 grams of protein. -Continue Magic cup TID with meals, each supplement provides 290 kcal and 9 grams of protein. -Continue MVI po daily.  Jacklynn Barnacle, MS, RD, LDN Pager number available on Amion

## 2020-02-19 NOTE — Progress Notes (Addendum)
SLP F/U Note  Patient Details Name: Thomas Coffey MRN: [id number] DOB: Mar 06, 1955   Cancelled treatment:       Reason Eval/Treat Not Completed:  (chart reviewed; consulted MD/NSG re: pt's status today). NSG reported pt "seemed to have difficulty swallowing" w/ thin liquids last evening though this was not reported during his meal.  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 Palliative Care has met w/ family at length re: Dendron. MD and Team have agreed w/ ongoing trial diet of pureed consistency w/ thin liquids Via Cup diet w/ Supervision and aspiration precautions at this time (precautions posted in room) and to monitor pt's status for negative sequelae of aspiration. If a decline in status from poor oral intake and/or suspected aspiration precautions, then family/pt may consider alternative means of feeding. This SLP has discussed w/ the Team that d/t pt's dysphagia, any diet consistency would present w/ increased risk for aspiration. Such presentation has been noted when pt has been on both thickened and thin liquids; again, suspect Esophageal phase impact and issues. Pt has expressed desire for drinking thin liquids "Pepsi - not diet" to this SLP. He feeds himself and eats a pureed consistency diet adequately(for volume) per Dietician notes.  In further conversation w/ MD today, MD will continue to monitor pt's status and response w/ oral diet then make further decisions w/ pt/family if/when necessary re: POC. MD agreed to carry further information re: pt's risk for aspiration w/ oral intake d/t multiple co-morbidities/risks. ST services can be available for  further education of MBSS results if needed. No further skilled swallowing assessment indicated at this time. MD to reconsult ST services if new needs arise while admitted. MD agreed.      Orinda Kenner, MS, CCC-SLP Speech Language Pathologist Rehab Services [phone] [facility] 02/19/2020, 4:54 PM

## 2020-02-19 NOTE — Progress Notes (Signed)
PROGRESS NOTE    Thomas Coffey   WSF:[id number]  DOB: 08/08/1955  PCP: Patient, [name]    DOA: 01/28/2020 LOS: 21   Brief Narrative   Thomas Coffey is a 65 y.o. male with medical history significant of alcohol abuse, tobacco abuse, alcoholic pancreatitis, CAD, myocardial infarction, atrial fibrillation not on anticoagulants, who presented to the ED on 01/28/2020 with 10 days of cough and body aches, in addition to progressive abdominal pain with nausea vomiting since the day before.  Evaluation in the ED revealed acute pancreatitis with lipase 518.  COVID-19 positive.  Liver enzymes elevated.  Chest x-ray showed acute bronchitic changes.  Patient has had issues with dysphagia, and made NPO on multiple occasions.  This seems secondary to his encephalopathy.  SLP consulted.  Currently tolerating dysphagia 1 diet.   Has been off CIWA and Ativan and doing well.  Started on PRN Vistaril for anxiety/agitation as pt continues to try to get out of the bed.  He has mostly been redirectable by staff for the past few days.  Hospitalization prolonged due to difficult disposition.  Pt requires SNF placement, is profoundly weak.  Palliative discussions on GOC with family are ongoing.  May get PEG tube.     Assessment & Plan   Principal Problem:   Acute respiratory disease due to COVID-19 virus Active Problems:   Alcohol abuse   Hypomagnesemia   Acute pancreatitis   Abnormal LFTs   CAD (coronary artery disease)   Atrial fibrillation, chronic (HCC)   Tobacco abuse   COVID-19   Failure to thrive in adult   Acute metabolic encephalopathy hypoactive delirium. --after prolonged course of alcohol withdrawal.  CT head and MRI brain negative for any acute findings. Appears consistent with hypoactive delirium.  --mental status waxes and weans.   Plan: --d/c PRN vistrail  --avoid all sedatives  Alcohol abuse / Uncomplicated Alcohol Withdrawal -resolved Son reports pt very heavy  drinker, all day every day, both beer and liquor for entire adult life. He was not aware of prior withdrawal or if hx of seizure or DT's --Tapered off Librium, completed on 1/23 -- Off CIWA since 1/27 -- stopped gabapentin  Aspiration pneumonia-resolved. Dysphagia  --completed 5 days course of Unasyn on 1/23 - resumed again on dysphagia 1 / pureed diet honey thick liquids 1/28, doing fairly well.   --Swallow function waxes and wanes with his level of alertness. --DG swallow eval 2/2 showed no overt aspiration.   --Pt has been eating better and feeding himself Plan: --Dys 1 diet with thin liquids --Aspiration precautions and ensure pt alert enough to eat and drink --Hold PEG tube placement for now  Inadequate p.o. intake  Severe Malnutrition - seems acute on chronic issue based his malnutrition.   --Palliative care following, goals of care discussions ongoing.  Should start tube feeds if continuing full scope of care.  Family requesting PEG tube.   GI consulted. --48 hour Calorie count ordered --Pt has been eating better and feeding himself Plan: --Dys 1 diet with thin liquids --nutrition following --Hold PEG tube placement for now  Hypoglycemia -due to poor p.o. intake.  Treated with D5W. --stop D5w and monitor, pt eating little better  Hypokalemia  / Hypomagnesemia / Hypophosphatemia  --monitor and replete PRN  COVID-19 infection -presented with 10-day history of body aches and cough, tested positive in the ED.  Has not been hypoxic.  Chest x-ray showed bronchitic changes. --Completed remdesivir, Steroids started on admission were stopped  given no hypoxia -- Scheduled Mucinex, as needed albuterol -- Vitamin C, zinc -- Completed 5 day course doxycycline for secondary bronchitis  Acute pancreatitis due to alcohol abuse, Present on admission, resolved -- Diet as tolerated -- As needed Zofran  Sinus tachycardia - Resolved.   Due to Etoh withdrawal most likely.     Hypertension - Improved.   Normotensive recently. Was likely due to EtOH withdrawal.  --cont amlodipine and Lopressor  Tobacco abuse  -nicotine patch.  Smoking cessation recommended.  Transaminitis -likely due to alcohol abuse.  Monitor CMP closely since on remdesivir. RUQ ultrasound showed likely hepatic steatosis, gall stones.  CAD -chronic, stable without active chest pain or acute ischemic changes on EKG.   --cont ASA 81  A. fib, chronic - Has been normal sinus on tele, was tachycardic related to alcohol withdrawal, now rate controlled for most part.   --Not on anticoagulation or rate control meds --cont lopressor 25 mg BID    DVT prophylaxis: enoxaparin (LOVENOX) injection 40 mg Start: 01/28/20 2200   Diet:  Diet Orders (From admission, onward)    Start     Ordered   02/18/20 1542  DIET - DYS 1 Room service appropriate? Yes with Assist; Fluid consistency: Thin  Diet effective now       Comments: Extra gravy on meats, potatoes. Yogurts, puddings. Pt may have Oatmeal per Speech w/ butter and sugar per Speech.  NO STRAWS!  Question Answer Comment  Room service appropriate? Yes with Assist   Fluid consistency: Thin      02/18/20 1546            Code Status: Full Code    Subjective 02/19/20    Pt has been eating drinking better, feeding himself, and gaining weight.  Still there are episodic reports of pt not able to take in thin liquids.   Sitter reported pt was sleeping most of the day today.  Did wake up to eat lunch.   Disposition Plan & Communication   DVT prophylaxis: Lovenox SQ Code Status: Full code  Family Communication: both sons updated on the phone today Status is: inpatient Dispo:   The patient is from: home Anticipated d/c is to: SNF Anticipated d/c date is: whenever bed available. Patient currently is medically stable to d/c.   Consults, Procedures, Significant Events   Consultants:   Palliative  care  Gastroenterology  Procedures:   None  Antimicrobials:  Anti-infectives (From admission, onward)   Start     Dose/Rate Route Frequency Ordered Stop   02/04/20 2200  Ampicillin-Sulbactam (UNASYN) 3 g in sodium chloride 0.9 % 100 mL IVPB        3 g 200 mL/hr over 30 Minutes Intravenous Every 6 hours 02/04/20 1320 02/07/20 2359   02/03/20 1015  amoxicillin-clavulanate (AUGMENTIN) 875-125 MG per tablet 1 tablet  Status:  Discontinued        1 tablet Oral Every 12 hours 02/03/20 0929 02/04/20 1320   01/29/20 1000  azithromycin (ZITHROMAX) tablet 250 mg  Status:  Discontinued       "Followed by" Linked Group Details   250 mg Oral Daily 01/28/20 1010 01/28/20 1025   01/29/20 1000  remdesivir 100 mg in sodium chloride 0.9 % 100 mL IVPB       "Followed by" Linked Group Details   100 mg 200 mL/hr over 30 Minutes Intravenous Daily 01/28/20 1024 02/02/20 0959   01/28/20 1200  remdesivir 200 mg in sodium chloride 0.9% 250 mL IVPB       "  Followed by" Linked Group Details   200 mg 580 mL/hr over 30 Minutes Intravenous Once 01/28/20 1024 01/28/20 1318   01/28/20 1030  doxycycline (VIBRA-TABS) tablet 100 mg  Status:  Discontinued        100 mg Oral Every 12 hours 01/28/20 1025 02/01/20 1425   01/28/20 1015  azithromycin (ZITHROMAX) tablet 500 mg  Status:  Discontinued       "Followed by" Linked Group Details   500 mg Oral Daily 01/28/20 1010 01/28/20 1025        Objective   Vitals:   02/19/20 0002 02/19/20 0409 02/19/20 0727 02/19/20 1100  BP: 108/77 100/83 110/75 102/75  Pulse: 91 98 99 100  Resp: 20 20 18 20   Temp: 98.4 F (36.9 C) 98.5 F (36.9 C) 98.5 F (36.9 C) 98.1 F (36.7 C)  TempSrc: Oral Oral Oral Oral  SpO2: 96% 97% 94% 92%  Weight:      Height:        Intake/Output Summary (Last 24 hours) at 02/19/2020 1444 Last data filed at 02/19/2020 1335 Gross per 24 hour  Intake --  Output 225 ml  Net -225 ml   Filed Weights   01/28/20 0532 02/06/20 0500 02/13/20 0500   Weight: 77.1 kg 73 kg 74.8 kg    Physical Exam:  Constitutional: NAD, sleeping, not very responsive CV: No cyanosis.   RESP: normal respiratory effort, on RA Extremities: No effusions, edema in BLE SKIN: warm, dry   Labs   Data Reviewed: I have personally reviewed following labs and imaging studies  CBC: Recent Labs  Lab 02/16/20 1413 02/19/20 0358  WBC 9.5 10.7*  HGB 13.6 13.9  HCT 41.2 42.6  MCV 93.2 94.7  PLT 354 349   Basic Metabolic Panel: Recent Labs  Lab 02/13/20 0417 02/14/20 0443 02/15/20 0610 02/16/20 1413 02/17/20 0651 02/19/20 0358  NA 147* 148* 142 139 136 139  K 3.5 3.7 3.5 3.8 4.8 4.4  CL 114* 115* 108 104 105 105  CO2 25 26 23 25 23 24   GLUCOSE 79 104* 105* 130* 80 112*  BUN 8 10 9 8 9 13   CREATININE 0.70 0.72 0.65 0.56* 0.67 0.64  CALCIUM 8.5* 8.6* 8.8* 8.8* 8.8* 9.1  MG 1.8 1.9 1.9 1.8 1.8 1.6*  PHOS 3.0 3.6 3.1 4.2 3.8  --    GFR: Estimated Creatinine Clearance: 93.3 mL/min (by C-G formula based on SCr of 0.64 mg/dL). Liver Function Tests: No results for input(s): AST, ALT, ALKPHOS, BILITOT, PROT, ALBUMIN in the last 168 hours. No results for input(s): LIPASE, AMYLASE in the last 168 hours. No results for input(s): AMMONIA in the last 168 hours. Coagulation Profile: Recent Labs  Lab 02/15/20 1054  INR 1.2   Cardiac Enzymes: No results for input(s): CKTOTAL, CKMB, CKMBINDEX, TROPONINI in the last 168 hours. BNP (last 3 results) No results for input(s): PROBNP in the last 8760 hours. HbA1C: No results for input(s): HGBA1C in the last 72 hours. CBG: No results for input(s): GLUCAP in the last 168 hours. Lipid Profile: No results for input(s): CHOL, HDL, LDLCALC, TRIG, CHOLHDL, LDLDIRECT in the last 72 hours. Thyroid Function Tests: No results for input(s): TSH, T4TOTAL, FREET4, T3FREE, THYROIDAB in the last 72 hours. Anemia Panel: No results for input(s): VITAMINB12, FOLATE, FERRITIN, TIBC, IRON, RETICCTPCT in the last 72  hours. Sepsis Labs: No results for input(s): PROCALCITON, LATICACIDVEN in the last 168 hours.  No results found for this or any previous visit (from the past 240  hour(s)).    Imaging Studies   No results found.   Medications   Scheduled Meds: . amLODipine  5 mg Oral Daily  . vitamin C  500 mg Oral Daily  . aspirin EC  81 mg Oral Daily  . dextromethorphan-guaiFENesin  1 tablet Oral BID  . enoxaparin (LOVENOX) injection  40 mg Subcutaneous Q24H  . feeding supplement  237 mL Oral BID BM  . magnesium oxide  400 mg Oral BID  . metoprolol tartrate  25 mg Oral BID  . multivitamin with minerals  1 tablet Oral Daily  . nicotine  21 mg Transdermal Daily  . potassium chloride  20 mEq Oral Daily  . senna-docusate  1 tablet Oral QHS  . zinc sulfate  220 mg Oral Daily   Continuous Infusions: . magnesium sulfate bolus IVPB 2 g (02/19/20 1403)      LOS: 21 days    Darlin Priestly, md [facility]  02/19/2020, 2:44 PM

## 2020-02-19 NOTE — Progress Notes (Signed)
Nutrition Follow-up  DOCUMENTATION CODES:   Severe malnutrition in context of social or environmental circumstances  INTERVENTION:  -Will discontinue honey-thick Mighty Shake now that patient is on thin liquids. -Provide Ensure Enlive po BID, each supplement provides 350 kcal and 20 grams of protein. -Continue Magic cup TID with meals, each supplement provides 290 kcal and 9 grams of protein. -Continue MVI po daily.  NUTRITION DIAGNOSIS:   Severe Malnutrition related to social / environmental circumstances (EtOH abuse, suspected inadequate oral intake) as evidenced by severe fat depletion,severe muscle depletion.  Ongoing.  GOAL:   Patient will meet greater than or equal to 90% of their needs  Met.  MONITOR:   PO intake,Supplement acceptance,Labs,Weight trends,Skin,I & O's  REASON FOR ASSESSMENT:   NPO/Clear Liquid Diet,Consult Assessment of nutrition requirement/status,Poor PO,Calorie Count  ASSESSMENT:   65 year old male with PMHx of EtOH abuse, tobacco abuse, alcoholic pancreatitis, CAD, MI, A-fib not on anticoagulants admitted with [id number], acute metabolic encephalopathy most likely due to EtOH withdrawal, aspiration PNA, acute pancreatitis now resolved.  1/24 pt made NPO 1/26 diet advanced to dysphagia 1 with honey-thick 1/27 pt made NPO 1/28 diet advanced back to dysphagia 1 with honey-thick 2/2 s/p MBS diet changed to dysphagia 1 with thin liquids  Attempted to meet with patient at bedside but he was sleeping. RN and Air cabin crew present in room at time of RD assessment. Patient has been much more awake and continues to eat well. 48 hour calorie count now complete. Will change supplements now that patient is on thin liquids. Discussions regarding PEG tube on hold now that patient is eating well.  Medications reviewed and include: vitamin C 500 mg daily, magnesium oxide 400 mg BID, MVI daily, nicotine patch, senna-docusate 1 tablet daily, zinc sulfate 220 mg  daily, magnesium sulfate 2 grams IV once today.  Labs reviewed: Magnesium 1.6.  I/O: 675 mL UOP yesterday (0.4 mL/kg/hr)  Weight trend: 74.8 kg on 1/28; +1.8 kg from 1/21  Diet Order:   Diet Order            DIET - DYS 1 Room service appropriate? Yes with Assist; Fluid consistency: Thin  Diet effective now                EDUCATION NEEDS:   No education needs have been identified at this time  Skin:  Skin Assessment: Reviewed RN Assessment  Last BM:  02/18/2020 - large type 4  Height:   Ht Readings from Last 1 Encounters:  01/28/20 5' 9"  (1.753 m)   Weight:   Wt Readings from Last 1 Encounters:  02/13/20 74.8 kg   Ideal Body Weight:  72.7 kg  BMI:  Body mass index is 24.37 kg/m.  Estimated Nutritional Needs:   Kcal:  1800-2000  Protein:  90-100 grams  Fluid:  1.8-2 L/day  Jacklynn Barnacle, MS, RD, LDN Pager number available on Amion

## 2020-02-19 NOTE — TOC Progression Note (Signed)
Transition of Care [facility]) - Progression Note    Patient Details  Name: Thomas Coffey MRN: [id number] Date of Birth: 29-Mar-1955  Transition of Care [facility]) CM/SW Contact  Trenton Founds, RN Phone Number: 02/19/2020, 2:32 PM  Clinical Narrative:  At this time patient still does not have any bed offers.   RNCM reached out to all SNF's that have not responded to prior bed requests and sent updated information with patient's improvement to see if any may be able to offer.     Expected Discharge Plan: Home w Home Health Services Barriers to Discharge: Continued Medical Work up  Expected Discharge Plan and Services Expected Discharge Plan: Home w Home Health Services       Living arrangements for the past 2 months: Single Family Home                                       Social Determinants of Health (SDOH) Interventions    Readmission Risk Interventions No flowsheet data found.

## 2020-02-20 LAB — BASIC METABOLIC PANEL
Anion gap: 5 (ref 5–15)
BUN: 14 mg/dL (ref 8–23)
CO2: 25 mmol/L (ref 22–32)
Calcium: 9.4 mg/dL (ref 8.9–10.3)
Chloride: 104 mmol/L (ref 98–111)
Creatinine, Ser: 0.56 mg/dL — ABNORMAL LOW (ref 0.61–1.24)
GFR, Estimated: >60 mL/min (ref >60)
Glucose, Bld: 98 mg/dL (ref 70–99)
Potassium: 4.4 mmol/L (ref 3.5–5.1)
Sodium: 134 mmol/L — ABNORMAL LOW (ref 135–145)

## 2020-02-20 LAB — CBC
HCT: 41.6 % (ref 39.0–52.0)
Hemoglobin: 13.7 g/dL (ref 13.0–17.0)
MCH: 30.8 pg (ref 26.0–34.0)
MCHC: 32.9 g/dL (ref 30.0–36.0)
MCV: 93.5 fL (ref 80.0–100.0)
Platelets: 372 10*3/uL (ref 150–400)
RBC: 4.45 MIL/uL (ref 4.22–5.81)
RDW: 13.1 % (ref 11.5–15.5)
WBC: 11.6 10*3/uL — ABNORMAL HIGH (ref 4.0–10.5)
nRBC: 0 % (ref 0.0–0.2)

## 2020-02-20 LAB — VITAMIN E
Vitamin E (Alpha Tocopherol): 4.8 mg/L — ABNORMAL LOW (ref 9.0–29.0)
Vitamin E(Gamma Tocopherol): 1 mg/L (ref 0.5–4.9)

## 2020-02-20 LAB — VITAMIN A: Vitamin A (Retinoic Acid): 8.6 ug/dL — ABNORMAL LOW (ref 22.0–69.5)

## 2020-02-20 LAB — MAGNESIUM: Magnesium: 1.9 mg/dL (ref 1.7–2.4)

## 2020-02-20 LAB — PHOSPHORUS: Phosphorus: 4 mg/dL (ref 2.5–4.6)

## 2020-02-20 NOTE — Progress Notes (Signed)
PROGRESS NOTE    Thomas Coffey   QZR:[id number]  DOB: Jul 10, 1955  PCP: Patient, [name]    DOA: 01/28/2020 LOS: 22   Brief Narrative   Thomas Coffey is a 65 y.o. male with medical history significant of alcohol abuse, tobacco abuse, alcoholic pancreatitis, CAD, myocardial infarction, atrial fibrillation not on anticoagulants, who presented to the ED on 01/28/2020 with 10 days of cough and body aches, in addition to progressive abdominal pain with nausea vomiting since the day before.  Evaluation in the ED revealed acute pancreatitis with lipase 518.  COVID-19 positive.  Liver enzymes elevated.  Chest x-ray showed acute bronchitic changes.  Patient has had issues with dysphagia, and made NPO on multiple occasions.  This seems secondary to his encephalopathy.  SLP consulted.  Currently tolerating dysphagia 1 diet.   Has been off CIWA and Ativan and doing well.  Started on PRN Vistaril for anxiety/agitation as pt continues to try to get out of the bed.  He has mostly been redirectable by staff for the past few days.  Hospitalization prolonged due to difficult disposition.  Pt requires SNF placement, is profoundly weak.  Palliative discussions on GOC with family are ongoing.  May get PEG tube.     Assessment & Plan   Principal Problem:   Acute respiratory disease due to COVID-19 virus Active Problems:   Alcohol abuse   Hypomagnesemia   Acute pancreatitis   Abnormal LFTs   CAD (coronary artery disease)   Atrial fibrillation, chronic (HCC)   Tobacco abuse   COVID-19   Failure to thrive in adult   Acute metabolic encephalopathy hypoactive delirium. --after prolonged course of alcohol withdrawal.  CT head and MRI brain negative for any acute findings. Appears consistent with hypoactive delirium.  --mental status waxes and weans.   Plan: --d/c PRN vistrail  --avoid all sedatives  Alcohol abuse / Uncomplicated Alcohol Withdrawal -resolved Son reports pt very heavy  drinker, all day every day, both beer and liquor for entire adult life. He was not aware of prior withdrawal or if hx of seizure or DT's --Tapered off Librium, completed on 1/23 -- Off CIWA since 1/27 -- stopped gabapentin  Aspiration pneumonia-resolved. Dysphagia  --completed 5 days course of Unasyn on 1/23 - resumed again on dysphagia 1 / pureed diet honey thick liquids 1/28, doing fairly well.   --Swallow function waxes and wanes with his level of alertness. --DG swallow eval 2/2 showed no overt aspiration.   --Pt has been eating better and feeding himself Plan: --Dys 1 diet with thin liquids --Aspiration precautions and ensure pt alert enough to eat and drink --Hold PEG tube placement for now  Inadequate p.o. intake  Severe Malnutrition - seems acute on chronic issue based his malnutrition.   --Palliative care following, goals of care discussions ongoing.  Should start tube feeds if continuing full scope of care.  Family requesting PEG tube.   GI consulted. --48 hour Calorie count ordered --Pt has been eating better and feeding himself Plan: --Dys 1 diet with thin liquids --nutrition following --Hold PEG tube placement for now  Hypoglycemia -due to poor p.o. intake.  Treated with D5W. --stop D5w and monitor, pt eating little better  Hypokalemia  / Hypomagnesemia / Hypophosphatemia  --monitor and replete PRN  COVID-19 infection -presented with 10-day history of body aches and cough, tested positive in the ED.  Has not been hypoxic.  Chest x-ray showed bronchitic changes. --Completed remdesivir, Steroids started on admission were stopped  given no hypoxia -- Scheduled Mucinex, as needed albuterol -- Vitamin C, zinc -- Completed 5 day course doxycycline for secondary bronchitis  Acute pancreatitis due to alcohol abuse, Present on admission, resolved -- Diet as tolerated -- As needed Zofran  Sinus tachycardia - Resolved.   Due to Etoh withdrawal most likely.     Hypertension - Improved.   Normotensive recently. Was likely due to EtOH withdrawal.  --cont amlodipine and Lopressor  Tobacco abuse  -nicotine patch.  Smoking cessation recommended.  Transaminitis -likely due to alcohol abuse.  Monitor CMP closely since on remdesivir. RUQ ultrasound showed likely hepatic steatosis, gall stones.  CAD -chronic, stable without active chest pain or acute ischemic changes on EKG.   --cont ASA 81  A. fib, chronic - Has been normal sinus on tele, was tachycardic related to alcohol withdrawal, now rate controlled for most part.   --Not on anticoagulation or rate control meds --cont lopressor 25 mg BID    DVT prophylaxis: enoxaparin (LOVENOX) injection 40 mg Start: 01/28/20 2200   Diet:  Diet Orders (From admission, onward)    Start     Ordered   02/18/20 1542  DIET - DYS 1 Room service appropriate? Yes with Assist; Fluid consistency: Thin  Diet effective now       Comments: Extra gravy on meats, potatoes. Yogurts, puddings. Pt may have Oatmeal per Speech w/ butter and sugar per Speech.  NO STRAWS!  Question Answer Comment  Room service appropriate? Yes with Assist   Fluid consistency: Thin      02/18/20 1546            Code Status: Full Code    Subjective 02/20/20    Pt ate breakfast and lunch on his own, and then sleeping in the afternoon.  Nursing noted pt eats better when alert, and when allowed to feed himself.   Disposition Plan & Communication   DVT prophylaxis: Lovenox SQ Code Status: Full code  Family Communication:  Status is: inpatient Dispo:   The patient is from: home Anticipated d/c is to: SNF Anticipated d/c date is: whenever bed available. Patient currently is medically stable to d/c.   Consults, Procedures, Significant Events   Consultants:   Palliative care  Gastroenterology  Procedures:   None  Antimicrobials:  Anti-infectives (From admission, onward)   Start     Dose/Rate Route Frequency  Ordered Stop   02/04/20 2200  Ampicillin-Sulbactam (UNASYN) 3 g in sodium chloride 0.9 % 100 mL IVPB        3 g 200 mL/hr over 30 Minutes Intravenous Every 6 hours 02/04/20 1320 02/07/20 2359   02/03/20 1015  amoxicillin-clavulanate (AUGMENTIN) 875-125 MG per tablet 1 tablet  Status:  Discontinued        1 tablet Oral Every 12 hours 02/03/20 0929 02/04/20 1320   01/29/20 1000  azithromycin (ZITHROMAX) tablet 250 mg  Status:  Discontinued       "Followed by" Linked Group Details   250 mg Oral Daily 01/28/20 1010 01/28/20 1025   01/29/20 1000  remdesivir 100 mg in sodium chloride 0.9 % 100 mL IVPB       "Followed by" Linked Group Details   100 mg 200 mL/hr over 30 Minutes Intravenous Daily 01/28/20 1024 02/02/20 0959   01/28/20 1200  remdesivir 200 mg in sodium chloride 0.9% 250 mL IVPB       "Followed by" Linked Group Details   200 mg 580 mL/hr over 30 Minutes Intravenous Once 01/28/20 1024 01/28/20  1318   01/28/20 1030  doxycycline (VIBRA-TABS) tablet 100 mg  Status:  Discontinued        100 mg Oral Every 12 hours 01/28/20 1025 02/01/20 1425   01/28/20 1015  azithromycin (ZITHROMAX) tablet 500 mg  Status:  Discontinued       "Followed by" Linked Group Details   500 mg Oral Daily 01/28/20 1010 01/28/20 1025        Objective   Vitals:   02/20/20 0528 02/20/20 0919 02/20/20 1131 02/20/20 1134  BP: 96/69 96/73 95/73  105/71  Pulse: 100 100 (!) 111 (!) 105  Resp: 18 16 16 16   Temp: 98.3 F (36.8 C) 98.8 F (37.1 C) 98.4 F (36.9 C) 98.3 F (36.8 C)  TempSrc: Oral     SpO2: 96% 98% 93% 93%  Weight:      Height:        Intake/Output Summary (Last 24 hours) at 02/20/2020 1532 Last data filed at 02/20/2020 1417 Gross per 24 hour  Intake 480 ml  Output 250 ml  Net 230 ml   Filed Weights   02/06/20 0500 02/13/20 0500 02/20/20 0454  Weight: 73 kg 74.8 kg 73.5 kg    Physical Exam:  Constitutional: NAD, sleeping CV: No cyanosis.   RESP: normal respiratory effort, on  RA Extremities: No effusions, edema in BLE SKIN: warm, dry    Labs   Data Reviewed: I have personally reviewed following labs and imaging studies  CBC: Recent Labs  Lab 02/16/20 1413 02/19/20 0358 02/20/20 0450  WBC 9.5 10.7* 11.6*  HGB 13.6 13.9 13.7  HCT 41.2 42.6 41.6  MCV 93.2 94.7 93.5  PLT 354 349 372   Basic Metabolic Panel: Recent Labs  Lab 02/14/20 0443 02/15/20 0610 02/16/20 1413 02/17/20 0651 02/19/20 0358 02/20/20 0450  NA 148* 142 139 136 139 134*  K 3.7 3.5 3.8 4.8 4.4 4.4  CL 115* 108 104 105 105 104  CO2 26 23 25 23 24 25   GLUCOSE 104* 105* 130* 80 112* 98  BUN 10 9 8 9 13 14   CREATININE 0.72 0.65 0.56* 0.67 0.64 0.56*  CALCIUM 8.6* 8.8* 8.8* 8.8* 9.1 9.4  MG 1.9 1.9 1.8 1.8 1.6* 1.9  PHOS 3.6 3.1 4.2 3.8  --  4.0   GFR: Estimated Creatinine Clearance: 93.3 mL/min (A) (by C-G formula based on SCr of 0.56 mg/dL (L)). Liver Function Tests: No results for input(s): AST, ALT, ALKPHOS, BILITOT, PROT, ALBUMIN in the last 168 hours. No results for input(s): LIPASE, AMYLASE in the last 168 hours. No results for input(s): AMMONIA in the last 168 hours. Coagulation Profile: Recent Labs  Lab 02/15/20 1054  INR 1.2   Cardiac Enzymes: No results for input(s): CKTOTAL, CKMB, CKMBINDEX, TROPONINI in the last 168 hours. BNP (last 3 results) No results for input(s): PROBNP in the last 8760 hours. HbA1C: No results for input(s): HGBA1C in the last 72 hours. CBG: No results for input(s): GLUCAP in the last 168 hours. Lipid Profile: No results for input(s): CHOL, HDL, LDLCALC, TRIG, CHOLHDL, LDLDIRECT in the last 72 hours. Thyroid Function Tests: No results for input(s): TSH, T4TOTAL, FREET4, T3FREE, THYROIDAB in the last 72 hours. Anemia Panel: No results for input(s): VITAMINB12, FOLATE, FERRITIN, TIBC, IRON, RETICCTPCT in the last 72 hours. Sepsis Labs: No results for input(s): PROCALCITON, LATICACIDVEN in the last 168 hours.  No results found for  this or any previous visit (from the past 240 hour(s)).    Imaging Studies   No  results found.   Medications   Scheduled Meds: . amLODipine  5 mg Oral Daily  . vitamin C  500 mg Oral Daily  . aspirin EC  81 mg Oral Daily  . enoxaparin (LOVENOX) injection  40 mg Subcutaneous Q24H  . feeding supplement  1 Container Oral BID BM  . magnesium oxide  400 mg Oral BID  . metoprolol tartrate  25 mg Oral BID  . multivitamin with minerals  1 tablet Oral Daily  . nicotine  21 mg Transdermal Daily  . potassium chloride  20 mEq Oral Daily  . senna-docusate  1 tablet Oral QHS  . zinc sulfate  220 mg Oral Daily   Continuous Infusions:     LOS: 22 days    Darlin Priestly, md [facility]  02/20/2020, 3:32 PM

## 2020-02-21 LAB — CBC
HCT: 40.3 % (ref 39.0–52.0)
Hemoglobin: 13.4 g/dL (ref 13.0–17.0)
MCH: 30.9 pg (ref 26.0–34.0)
MCHC: 33.3 g/dL (ref 30.0–36.0)
MCV: 92.9 fL (ref 80.0–100.0)
Platelets: 344 10*3/uL (ref 150–400)
RBC: 4.34 MIL/uL (ref 4.22–5.81)
RDW: 13 % (ref 11.5–15.5)
WBC: 10.7 10*3/uL — ABNORMAL HIGH (ref 4.0–10.5)
nRBC: 0 % (ref 0.0–0.2)

## 2020-02-21 LAB — BASIC METABOLIC PANEL
Anion gap: 9 (ref 5–15)
BUN: 16 mg/dL (ref 8–23)
CO2: 23 mmol/L (ref 22–32)
Calcium: 9.5 mg/dL (ref 8.9–10.3)
Chloride: 106 mmol/L (ref 98–111)
Creatinine, Ser: 0.71 mg/dL (ref 0.61–1.24)
GFR, Estimated: >60 mL/min (ref >60)
Glucose, Bld: 132 mg/dL — ABNORMAL HIGH (ref 70–99)
Potassium: 4.5 mmol/L (ref 3.5–5.1)
Sodium: 138 mmol/L (ref 135–145)

## 2020-02-21 LAB — MAGNESIUM: Magnesium: 1.7 mg/dL (ref 1.7–2.4)

## 2020-02-21 LAB — PHOSPHORUS: Phosphorus: 4.6 mg/dL (ref 2.5–4.6)

## 2020-02-21 NOTE — Plan of Care (Signed)

## 2020-02-21 NOTE — Progress Notes (Signed)
PROGRESS NOTE    [name]   [id number]  DOB: 15-Oct-1955  PCP: Patient, [name]    DOA: 01/28/2020 LOS: 23   Brief Narrative   Thomas Coffey is a 65 y.o. male with medical history significant of alcohol abuse, tobacco abuse, alcoholic pancreatitis, CAD, myocardial infarction, atrial fibrillation not on anticoagulants, who presented to the ED on 01/28/2020 with 10 days of cough and body aches, in addition to progressive abdominal pain with nausea vomiting since the day before.  Evaluation in the ED revealed acute pancreatitis with lipase 518.  COVID-19 positive.  Liver enzymes elevated.  Chest x-ray showed acute bronchitic changes.  Patient has had issues with dysphagia, and made NPO on multiple occasions.  This seems secondary to his encephalopathy.  SLP consulted.  Currently tolerating dysphagia 1 diet.   Has been off CIWA and Ativan and doing well.  Started on PRN Vistaril for anxiety/agitation as pt continues to try to get out of the bed.  He has mostly been redirectable by staff for the past few days.  Hospitalization prolonged due to difficult disposition.  Pt requires SNF placement, is profoundly weak.  Palliative discussions on GOC with family are ongoing.  May get PEG tube.     Assessment & Plan   Principal Problem:   Acute respiratory disease due to COVID-19 virus Active Problems:   Alcohol abuse   Hypomagnesemia   Acute pancreatitis   Abnormal LFTs   CAD (coronary artery disease)   Atrial fibrillation, chronic (HCC)   Tobacco abuse   COVID-19   Failure to thrive in adult   Acute metabolic encephalopathy hypoactive delirium. --after prolonged course of alcohol withdrawal.  CT head and MRI brain negative for any acute findings. Appears consistent with hypoactive delirium.  --mental status waxes and weans.   --D/c'ed all sedatives Plan: --avoid all sedatives  Alcohol abuse / Uncomplicated Alcohol Withdrawal -resolved Son reports pt very  heavy drinker, all day every day, both beer and liquor for entire adult life. He was not aware of prior withdrawal or if hx of seizure or DT's --Tapered off Librium, completed on 1/23 -- Off CIWA since 1/27 -- stopped gabapentin Plan: --family to help with alcohol cessation after discharge  Aspiration pneumonia-resolved. Dysphagia  --completed 5 days course of Unasyn on 1/23 - resumed again on dysphagia 1 / pureed diet honey thick liquids 1/28, doing fairly well.   --Swallow function waxes and wanes with his level of alertness. --DG swallow eval 2/2 showed no overt aspiration.   --Pt has been eating better and feeding himself Plan: --Dys 1 diet with thin liquids.  Upgrade consistency per SLP eval. --Aspiration precautions and ensure pt alert enough to eat and drink --No need for PEG tube placement for now  Inadequate p.o. intake  Severe Malnutrition - seems acute on chronic issue based his malnutrition.   --Palliative care following, goals of care discussions ongoing.  GI was consulted for PEG tube, however, Pt has been eating better and feeding himself, and gaining weight. Plan: --Dys 1 diet with thin liquids --nutrition following --Hold PEG tube placement for now  Hypoglycemia -due to poor p.o. intake.   Treated with D5W. --pt eating better now.  No more need for D5 infusion.  Hypokalemia  / Hypomagnesemia / Hypophosphatemia  --monitor and replete PRN  COVID-19 infection  -presented with 10-day history of body aches and cough, tested positive in the ED.  Has not been hypoxic.  Chest x-ray showed bronchitic changes. --Completed  remdesivir, Steroids started on admission were stopped given no hypoxia -- Scheduled Mucinex, as needed albuterol -- Vitamin C, zinc -- Completed 5 day course doxycycline for secondary bronchitis  Acute pancreatitis due to alcohol abuse, Present on admission, resolved -- Diet as tolerated  Sinus tachycardia - Resolved.   Due to Etoh  withdrawal most likely.    Hx of Hypertension Was likely due to EtOH withdrawal.  --d/c amlodipine today due to soft BP --cont home Lopressor  Tobacco abuse  -nicotine patch.  Smoking cessation recommended.  Transaminitis -likely due to alcohol abuse.  Monitor CMP closely since on remdesivir. RUQ ultrasound showed likely hepatic steatosis, gall stones.  CAD -chronic --stable without active chest pain or acute ischemic changes on EKG.   --cont ASA 81  A. fib, chronic  - Has been normal sinus on tele, was tachycardic related to alcohol withdrawal, now rate controlled for most part.   --Not on anticoagulation or rate control meds --cont lopressor 25 mg BID    DVT prophylaxis: enoxaparin (LOVENOX) injection 40 mg Start: 01/28/20 2200   Diet:  Diet Orders (From admission, onward)    Start     Ordered   02/18/20 1542  DIET - DYS 1 Room service appropriate? Yes with Assist; Fluid consistency: Thin  Diet effective now       Comments: Extra gravy on meats, potatoes. Yogurts, puddings. Pt may have Oatmeal per Speech w/ butter and sugar per Speech.  NO STRAWS!  Question Answer Comment  Room service appropriate? Yes with Assist   Fluid consistency: Thin      02/18/20 1546            Code Status: Full Code    Subjective 02/21/20    Pt has been eating well on his own.  Also more alert and awake.  No pain.  Eager to work with PT and get out of bed.  Pt asked to eat more solid foods today.   Disposition Plan & Communication   DVT prophylaxis: Lovenox SQ Code Status: Full code  Family Communication:  Status is: inpatient Dispo:   The patient is from: home Anticipated d/c is to: SNF Anticipated d/c date is: whenever bed available. Patient currently is medically stable to d/c.   Consults, Procedures, Significant Events   Consultants:   Palliative care  Gastroenterology  Procedures:   None  Antimicrobials:  Anti-infectives (From admission, onward)   Start      Dose/Rate Route Frequency Ordered Stop   02/04/20 2200  Ampicillin-Sulbactam (UNASYN) 3 g in sodium chloride 0.9 % 100 mL IVPB        3 g 200 mL/hr over 30 Minutes Intravenous Every 6 hours 02/04/20 1320 02/07/20 2359   02/03/20 1015  amoxicillin-clavulanate (AUGMENTIN) 875-125 MG per tablet 1 tablet  Status:  Discontinued        1 tablet Oral Every 12 hours 02/03/20 0929 02/04/20 1320   01/29/20 1000  azithromycin (ZITHROMAX) tablet 250 mg  Status:  Discontinued       "Followed by" Linked Group Details   250 mg Oral Daily 01/28/20 1010 01/28/20 1025   01/29/20 1000  remdesivir 100 mg in sodium chloride 0.9 % 100 mL IVPB       "Followed by" Linked Group Details   100 mg 200 mL/hr over 30 Minutes Intravenous Daily 01/28/20 1024 02/02/20 0959   01/28/20 1200  remdesivir 200 mg in sodium chloride 0.9% 250 mL IVPB       "Followed by" Linked Group  Details   200 mg 580 mL/hr over 30 Minutes Intravenous Once 01/28/20 1024 01/28/20 1318   01/28/20 1030  doxycycline (VIBRA-TABS) tablet 100 mg  Status:  Discontinued        100 mg Oral Every 12 hours 01/28/20 1025 02/01/20 1425   01/28/20 1015  azithromycin (ZITHROMAX) tablet 500 mg  Status:  Discontinued       "Followed by" Linked Group Details   500 mg Oral Daily 01/28/20 1010 01/28/20 1025        Objective   Vitals:   02/20/20 2333 02/21/20 0742 02/21/20 1153 02/21/20 1639  BP: 106/73 99/65 97/73  101/66  Pulse: (!) 104 97 100 (!) 106  Resp: 17 18 14 17   Temp: 99.5 F (37.5 C) 98.5 F (36.9 C) 98.1 F (36.7 C) 98.9 F (37.2 C)  TempSrc: Oral  Oral   SpO2: 97% 95% 94% 98%  Weight:      Height:        Intake/Output Summary (Last 24 hours) at 02/21/2020 1655 Last data filed at 02/21/2020 04/20/2020 Gross per 24 hour  Intake 240 ml  Output 200 ml  Net 40 ml   Filed Weights   02/06/20 0500 02/13/20 0500 02/20/20 0454  Weight: 73 kg 74.8 kg 73.5 kg    Physical Exam:  Constitutional: NAD, alert, oriented to self and place, talked a  lot today, though speech difficult to understand HEENT: conjunctivae and lids normal, EOMI CV: No cyanosis.   RESP: normal respiratory effort, on RA Extremities: No effusions, edema in BLE SKIN: warm, dry Neuro: II - XII grossly intact.   Psych: Normal mood and affect.      Labs   Data Reviewed: I have personally reviewed following labs and imaging studies  CBC: Recent Labs  Lab 02/16/20 1413 02/19/20 0358 02/20/20 0450 02/21/20 0446  WBC 9.5 10.7* 11.6* 10.7*  HGB 13.6 13.9 13.7 13.4  HCT 41.2 42.6 41.6 40.3  MCV 93.2 94.7 93.5 92.9  PLT 354 349 372 344   Basic Metabolic Panel: Recent Labs  Lab 02/15/20 0610 02/16/20 1413 02/17/20 0651 02/19/20 0358 02/20/20 0450 02/21/20 0446  NA 142 139 136 139 134* 138  K 3.5 3.8 4.8 4.4 4.4 4.5  CL 108 104 105 105 104 106  CO2 23 25 23 24 25 23   GLUCOSE 105* 130* 80 112* 98 132*  BUN 9 8 9 13 14 16   CREATININE 0.65 0.56* 0.67 0.64 0.56* 0.71  CALCIUM 8.8* 8.8* 8.8* 9.1 9.4 9.5  MG 1.9 1.8 1.8 1.6* 1.9 1.7  PHOS 3.1 4.2 3.8  --  4.0 4.6   GFR: Estimated Creatinine Clearance: 93.3 mL/min (by C-G formula based on SCr of 0.71 mg/dL). Liver Function Tests: No results for input(s): AST, ALT, ALKPHOS, BILITOT, PROT, ALBUMIN in the last 168 hours. No results for input(s): LIPASE, AMYLASE in the last 168 hours. No results for input(s): AMMONIA in the last 168 hours. Coagulation Profile: Recent Labs  Lab 02/15/20 1054  INR 1.2   Cardiac Enzymes: No results for input(s): CKTOTAL, CKMB, CKMBINDEX, TROPONINI in the last 168 hours. BNP (last 3 results) No results for input(s): PROBNP in the last 8760 hours. HbA1C: No results for input(s): HGBA1C in the last 72 hours. CBG: No results for input(s): GLUCAP in the last 168 hours. Lipid Profile: No results for input(s): CHOL, HDL, LDLCALC, TRIG, CHOLHDL, LDLDIRECT in the last 72 hours. Thyroid Function Tests: No results for input(s): TSH, T4TOTAL, FREET4, T3FREE, THYROIDAB in  the last  72 hours. Anemia Panel: No results for input(s): VITAMINB12, FOLATE, FERRITIN, TIBC, IRON, RETICCTPCT in the last 72 hours. Sepsis Labs: No results for input(s): PROCALCITON, LATICACIDVEN in the last 168 hours.  No results found for this or any previous visit (from the past 240 hour(s)).    Imaging Studies   No results found.   Medications   Scheduled Meds: . vitamin C  500 mg Oral Daily  . aspirin EC  81 mg Oral Daily  . enoxaparin (LOVENOX) injection  40 mg Subcutaneous Q24H  . feeding supplement  1 Container Oral BID BM  . magnesium oxide  400 mg Oral BID  . metoprolol tartrate  25 mg Oral BID  . multivitamin with minerals  1 tablet Oral Daily  . nicotine  21 mg Transdermal Daily  . potassium chloride  20 mEq Oral Daily  . senna-docusate  1 tablet Oral QHS  . zinc sulfate  220 mg Oral Daily   Continuous Infusions:     LOS: 23 days    Darlin Priestly, md [facility]  02/21/2020, 4:55 PM

## 2020-02-22 LAB — CBC
HCT: 40.3 % (ref 39.0–52.0)
Hemoglobin: 13 g/dL (ref 13.0–17.0)
MCH: 30.2 pg (ref 26.0–34.0)
MCHC: 32.3 g/dL (ref 30.0–36.0)
MCV: 93.7 fL (ref 80.0–100.0)
Platelets: 334 10*3/uL (ref 150–400)
RBC: 4.3 MIL/uL (ref 4.22–5.81)
RDW: 13.1 % (ref 11.5–15.5)
WBC: 10.2 10*3/uL (ref 4.0–10.5)
nRBC: 0 % (ref 0.0–0.2)

## 2020-02-22 LAB — BASIC METABOLIC PANEL
Anion gap: 10 (ref 5–15)
BUN: 13 mg/dL (ref 8–23)
CO2: 23 mmol/L (ref 22–32)
Calcium: 9.6 mg/dL (ref 8.9–10.3)
Chloride: 103 mmol/L (ref 98–111)
Creatinine, Ser: 0.58 mg/dL — ABNORMAL LOW (ref 0.61–1.24)
GFR, Estimated: >60 mL/min (ref >60)
Glucose, Bld: 113 mg/dL — ABNORMAL HIGH (ref 70–99)
Potassium: 4.1 mmol/L (ref 3.5–5.1)
Sodium: 136 mmol/L (ref 135–145)

## 2020-02-22 LAB — PHOSPHORUS: Phosphorus: 4.3 mg/dL (ref 2.5–4.6)

## 2020-02-22 LAB — MAGNESIUM: Magnesium: 1.7 mg/dL (ref 1.7–2.4)

## 2020-02-22 NOTE — Plan of Care (Signed)

## 2020-02-22 NOTE — Progress Notes (Signed)
PROGRESS NOTE    Thomas Coffey   VZC:[id number]  DOB: Aug 30, 1955  PCP: Patient, [name]    DOA: 01/28/2020 LOS: 24   Brief Narrative   Thomas Coffey is a 65 y.o. male with medical history significant of alcohol abuse, tobacco abuse, alcoholic pancreatitis, CAD, myocardial infarction, atrial fibrillation not on anticoagulants, who presented to the ED on 01/28/2020 with 10 days of cough and body aches, in addition to progressive abdominal pain with nausea vomiting since the day before.  Evaluation in the ED revealed acute pancreatitis with lipase 518.  COVID-19 positive.  Liver enzymes elevated.  Chest x-ray showed acute bronchitic changes.  Patient has had issues with dysphagia, and made NPO on multiple occasions.  This seems secondary to his encephalopathy.  SLP consulted.  Currently tolerating dysphagia 1 diet.   Has been off CIWA and Ativan and doing well.  Started on PRN Vistaril for anxiety/agitation as pt continues to try to get out of the bed.  He has mostly been redirectable by staff for the past few days.  Hospitalization prolonged due to difficult disposition.  Pt requires SNF placement, is profoundly weak.  Palliative discussions on GOC with family are ongoing.  May get PEG tube.     Assessment & Plan   Principal Problem:   Acute respiratory disease due to COVID-19 virus Active Problems:   Alcohol abuse   Hypomagnesemia   Acute pancreatitis   Abnormal LFTs   CAD (coronary artery disease)   Atrial fibrillation, chronic (HCC)   Tobacco abuse   COVID-19   Failure to thrive in adult   Acute metabolic encephalopathy hypoactive delirium. --after prolonged course of alcohol withdrawal.  CT head and MRI brain negative for any acute findings. Appears consistent with hypoactive delirium.  --mental status waxes and weans.   --D/c'ed all sedatives Plan: --avoid all sedatives  Alcohol abuse / Uncomplicated Alcohol Withdrawal -resolved Son reports pt very  heavy drinker, all day every day, both beer and liquor for entire adult life. He was not aware of prior withdrawal or if hx of seizure or DT's --Tapered off Librium, completed on 1/23 -- Off CIWA since 1/27 -- stopped gabapentin Plan: --family to help with alcohol cessation after discharge  Aspiration pneumonia-resolved. Dysphagia  --completed 5 days course of Unasyn on 1/23 - resumed again on dysphagia 1 / pureed diet honey thick liquids 1/28, doing fairly well.   --Swallow function waxes and wanes with his level of alertness. --DG swallow eval 2/2 showed no overt aspiration.   --Pt has been eating better and feeding himself Plan: --Dys 1 diet with thin liquids.  Upgrade consistency per SLP eval. --Aspiration precautions and ensure pt alert enough to eat and drink --No need for PEG tube placement for now  Inadequate p.o. intake  Severe Malnutrition - seems acute on chronic issue based his malnutrition.   --Palliative care following, goals of care discussions ongoing.  GI was consulted for PEG tube, however, Pt has been eating better and feeding himself, and gaining weight. Plan: --Dys 1 diet with thin liquids --nutrition following --Hold PEG tube placement for now  Hypoglycemia -due to poor p.o. intake.   Treated with D5W. --pt eating better now.  No more need for D5 infusion.  Hypokalemia  / Hypomagnesemia / Hypophosphatemia  --monitor and replete PRN  COVID-19 infection  -presented with 10-day history of body aches and cough, tested positive in the ED.  Has not been hypoxic.  Chest x-ray showed bronchitic changes. --Completed  remdesivir, Steroids started on admission were stopped given no hypoxia -- Scheduled Mucinex, as needed albuterol -- Vitamin C, zinc -- Completed 5 day course doxycycline for secondary bronchitis  Acute pancreatitis due to alcohol abuse, Present on admission, resolved -- Diet as tolerated  Sinus tachycardia - Resolved.   Due to Etoh  withdrawal most likely.    Hx of Hypertension Was likely due to EtOH withdrawal.  --d/c amlodipine today due to soft BP --cont home Lopressor  Tobacco abuse  -nicotine patch.  Smoking cessation recommended.  Transaminitis -likely due to alcohol abuse.  Monitor CMP closely since on remdesivir. RUQ ultrasound showed likely hepatic steatosis, gall stones.  CAD -chronic --stable without active chest pain or acute ischemic changes on EKG.   --cont ASA 81  A. fib, chronic  - Has been normal sinus on tele, was tachycardic related to alcohol withdrawal, now rate controlled for most part.   --Not on anticoagulation or rate control meds --cont lopressor 25 mg BID    DVT prophylaxis: enoxaparin (LOVENOX) injection 40 mg Start: 01/28/20 2200   Diet:  Diet Orders (From admission, onward)    Start     Ordered   02/18/20 1542  DIET - DYS 1 Room service appropriate? Yes with Assist; Fluid consistency: Thin  Diet effective now       Comments: Extra gravy on meats, potatoes. Yogurts, puddings. Pt may have Oatmeal per Speech w/ butter and sugar per Speech.  NO STRAWS!  Question Answer Comment  Room service appropriate? Yes with Assist   Fluid consistency: Thin      02/18/20 1546            Code Status: Full Code    Subjective 02/22/20    Pt said he overslept and missed his breakfast, but ate lunch well.  No pain.  No N/V/D.   Disposition Plan & Communication   DVT prophylaxis: Lovenox SQ Code Status: Full code  Family Communication:  Status is: inpatient Dispo:   The patient is from: home Anticipated d/c is to: SNF Anticipated d/c date is: whenever bed available. Patient currently is medically stable to d/c.   Consults, Procedures, Significant Events   Consultants:   Palliative care  Gastroenterology  Procedures:   None  Antimicrobials:  Anti-infectives (From admission, onward)   Start     Dose/Rate Route Frequency Ordered Stop   02/04/20 2200   Ampicillin-Sulbactam (UNASYN) 3 g in sodium chloride 0.9 % 100 mL IVPB        3 g 200 mL/hr over 30 Minutes Intravenous Every 6 hours 02/04/20 1320 02/07/20 2359   02/03/20 1015  amoxicillin-clavulanate (AUGMENTIN) 875-125 MG per tablet 1 tablet  Status:  Discontinued        1 tablet Oral Every 12 hours 02/03/20 0929 02/04/20 1320   01/29/20 1000  azithromycin (ZITHROMAX) tablet 250 mg  Status:  Discontinued       "Followed by" Linked Group Details   250 mg Oral Daily 01/28/20 1010 01/28/20 1025   01/29/20 1000  remdesivir 100 mg in sodium chloride 0.9 % 100 mL IVPB       "Followed by" Linked Group Details   100 mg 200 mL/hr over 30 Minutes Intravenous Daily 01/28/20 1024 02/02/20 0959   01/28/20 1200  remdesivir 200 mg in sodium chloride 0.9% 250 mL IVPB       "Followed by" Linked Group Details   200 mg 580 mL/hr over 30 Minutes Intravenous Once 01/28/20 1024 01/28/20 1318   01/28/20  1030  doxycycline (VIBRA-TABS) tablet 100 mg  Status:  Discontinued        100 mg Oral Every 12 hours 01/28/20 1025 02/01/20 1425   01/28/20 1015  azithromycin (ZITHROMAX) tablet 500 mg  Status:  Discontinued       "Followed by" Linked Group Details   500 mg Oral Daily 01/28/20 1010 01/28/20 1025        Objective   Vitals:   02/22/20 0551 02/22/20 0726 02/22/20 1118 02/22/20 1547  BP: 103/79 111/81 102/73 100/69  Pulse: (!) 110 90 96 99  Resp: 18 16 16 18   Temp: 98.1 F (36.7 C) 98.4 F (36.9 C) (!) 97.5 F (36.4 C) 98.3 F (36.8 C)  TempSrc:      SpO2: 94% 100% 95% 96%  Weight:      Height:        Intake/Output Summary (Last 24 hours) at 02/22/2020 1639 Last data filed at 02/22/2020 1343 Gross per 24 hour  Intake 720 ml  Output 600 ml  Net 120 ml   Filed Weights   02/06/20 0500 02/13/20 0500 02/20/20 0454  Weight: 73 kg 74.8 kg 73.5 kg    Physical Exam:  Constitutional: NAD, alert, oriented to self and place, sitting up in bed HEENT: conjunctivae and lids normal, EOMI CV: No  cyanosis.   RESP: normal respiratory effort, on RA Extremities: No effusions, edema in BLE SKIN: warm, dry Neuro: II - XII grossly intact.   Psych: Normal mood and affect.      Labs   Data Reviewed: I have personally reviewed following labs and imaging studies  CBC: Recent Labs  Lab 02/16/20 1413 02/19/20 0358 02/20/20 0450 02/21/20 0446 02/22/20 0518  WBC 9.5 10.7* 11.6* 10.7* 10.2  HGB 13.6 13.9 13.7 13.4 13.0  HCT 41.2 42.6 41.6 40.3 40.3  MCV 93.2 94.7 93.5 92.9 93.7  PLT 354 349 372 344 334   Basic Metabolic Panel: Recent Labs  Lab 02/16/20 1413 02/17/20 0651 02/19/20 0358 02/20/20 0450 02/21/20 0446 02/22/20 0518  NA 139 136 139 134* 138 136  K 3.8 4.8 4.4 4.4 4.5 4.1  CL 104 105 105 104 106 103  CO2 25 23 24 25 23 23   GLUCOSE 130* 80 112* 98 132* 113*  BUN 8 9 13 14 16 13   CREATININE 0.56* 0.67 0.64 0.56* 0.71 0.58*  CALCIUM 8.8* 8.8* 9.1 9.4 9.5 9.6  MG 1.8 1.8 1.6* 1.9 1.7 1.7  PHOS 4.2 3.8  --  4.0 4.6 4.3   GFR: Estimated Creatinine Clearance: 93.3 mL/min (A) (by C-G formula based on SCr of 0.58 mg/dL (L)). Liver Function Tests: No results for input(s): AST, ALT, ALKPHOS, BILITOT, PROT, ALBUMIN in the last 168 hours. No results for input(s): LIPASE, AMYLASE in the last 168 hours. No results for input(s): AMMONIA in the last 168 hours. Coagulation Profile: No results for input(s): INR, PROTIME in the last 168 hours. Cardiac Enzymes: No results for input(s): CKTOTAL, CKMB, CKMBINDEX, TROPONINI in the last 168 hours. BNP (last 3 results) No results for input(s): PROBNP in the last 8760 hours. HbA1C: No results for input(s): HGBA1C in the last 72 hours. CBG: No results for input(s): GLUCAP in the last 168 hours. Lipid Profile: No results for input(s): CHOL, HDL, LDLCALC, TRIG, CHOLHDL, LDLDIRECT in the last 72 hours. Thyroid Function Tests: No results for input(s): TSH, T4TOTAL, FREET4, T3FREE, THYROIDAB in the last 72 hours. Anemia Panel: No  results for input(s): VITAMINB12, FOLATE, FERRITIN, TIBC, IRON, RETICCTPCT in  the last 72 hours. Sepsis Labs: No results for input(s): PROCALCITON, LATICACIDVEN in the last 168 hours.  No results found for this or any previous visit (from the past 240 hour(s)).    Imaging Studies   No results found.   Medications   Scheduled Meds: . vitamin C  500 mg Oral Daily  . aspirin EC  81 mg Oral Daily  . enoxaparin (LOVENOX) injection  40 mg Subcutaneous Q24H  . feeding supplement  1 Container Oral BID BM  . magnesium oxide  400 mg Oral BID  . metoprolol tartrate  25 mg Oral BID  . multivitamin with minerals  1 tablet Oral Daily  . nicotine  21 mg Transdermal Daily  . potassium chloride  20 mEq Oral Daily  . senna-docusate  1 tablet Oral QHS  . zinc sulfate  220 mg Oral Daily   Continuous Infusions:     LOS: 24 days    Darlin Priestly, md [facility]  02/22/2020, 4:39 PM

## 2020-02-23 LAB — BASIC METABOLIC PANEL
Anion gap: 10 (ref 5–15)
BUN: 14 mg/dL (ref 8–23)
CO2: 23 mmol/L (ref 22–32)
Calcium: 9.2 mg/dL (ref 8.9–10.3)
Chloride: 104 mmol/L (ref 98–111)
Creatinine, Ser: 0.58 mg/dL — ABNORMAL LOW (ref 0.61–1.24)
GFR, Estimated: >60 mL/min (ref >60)
Glucose, Bld: 161 mg/dL — ABNORMAL HIGH (ref 70–99)
Potassium: 4 mmol/L (ref 3.5–5.1)
Sodium: 137 mmol/L (ref 135–145)

## 2020-02-23 LAB — CBC
HCT: 40.1 % (ref 39.0–52.0)
Hemoglobin: 13 g/dL (ref 13.0–17.0)
MCH: 30.4 pg (ref 26.0–34.0)
MCHC: 32.4 g/dL (ref 30.0–36.0)
MCV: 93.7 fL (ref 80.0–100.0)
Platelets: 345 10*3/uL (ref 150–400)
RBC: 4.28 MIL/uL (ref 4.22–5.81)
RDW: 13.1 % (ref 11.5–15.5)
WBC: 9.4 10*3/uL (ref 4.0–10.5)
nRBC: 0 % (ref 0.0–0.2)

## 2020-02-23 LAB — MAGNESIUM: Magnesium: 1.7 mg/dL (ref 1.7–2.4)

## 2020-02-23 NOTE — Progress Notes (Signed)
SLP Cancellation Note  Patient Details Name: Thomas Coffey MRN: [id number] DOB: 1955/07/07   Cancelled treatment:       Reason Eval/Treat Not Completed:  (attempted trials w/ pt). Upon attempt to have pt trial Minced po's, pt stated "not right now" and remained sleeping under covers in the bed(he spoke through the covers over his head).  ST services will f/u in the morning at a meal (of Minced foods) when he may be more receptive to eating/drinking. MSG updated.    Jerilynn Som, MS, CCC-SLP Speech Language Pathologist Rehab Services [phone] [facility] 02/23/2020, 2:48 PM

## 2020-02-23 NOTE — Progress Notes (Addendum)
SLP F/U Note  Patient Details Name: Thomas Coffey MRN: [id number] DOB: [birth year]   F/u:       Reason Eval/Treat Not Completed:  (MD consulted; chart reviewed). MD contacted ST services re: possibility of pt upgrading to a dysphagia level 2 diet (minced foods) away from a full puree diet. Pt has Missing dentition but stated he ate "regular" foods at home prior during tx previously. MD agreed w/ upgrade to a level 2 w/ NSG to monitor if any difficulties w/ consistency at next dinner meal; if so, then downgrade back to a level 1 (puree foods). ST will address w/ trials at bedside initially then can be available if further needs. NSG updated and agreed.      Jerilynn Som, MS, CCC-SLP Speech Language Pathologist Rehab Services [phone] [facility] 02/23/2020, 1:26 PM

## 2020-02-23 NOTE — Progress Notes (Signed)
Physical Therapy Treatment Patient Details Name: SYLVAN SOOKDEO MRN: [id number] DOB: [birth year] Today's Date: 02/23/2020    History of Present Illness Ancel D Gavitt is a 65 y.o. male with medical history significant of alcohol abuse, tobacco abuse, alcoholic pancreatitis, CAD, myocardial infarction, atrial fibrillation not on anticoagulants, who presented to the ED on 01/28/2020 with 10 days of cough and body aches, in addition to progressive abdominal pain with nausea vomiting since the day before.     Evaluation in the ED revealed acute pancreatitis and COVID-19 positive. MD assessment includes: Acute metabolic encephalopathy, alcohol abuse, aspiration PNA now resolved, severe malnutrition, hypoglycemia, hypokalemia, Covid-19, acute pancreatitis, sinus tachycardia, HTN, and transaminitis.    PT Comments    Pt ready for session.  HEP review, education and return demo x 10 for supine and seated ex.  To EOB with no assist.  Sitting steady.  Stood with min guard and is able to walk 40' and 61' with seated rest with RW with min guard/assist.  Overall gait quality remains poor with flexed posture and Leu too far ahead of him which he does occasionally try to correct without cues.  As he fatigues gait quality decreases.  Hand placements poor despite verbal and tactile cues.  He does remain in recliner with alarm on and needs in reach after session but after 30 minutes, Alarm was going off and he was sitting on extended leg rest of chair in attempts to use urinal.  Assisted and he stood and transferred back to bed despite encouragement to remain in recliner for lunch with min a x 1.     Follow Up Recommendations  SNF     Equipment Recommendations       Recommendations for Other Services       Precautions / Restrictions Precautions Precautions: Fall Restrictions Weight Bearing Restrictions: No    Mobility  Bed Mobility Overal bed mobility: Modified Independent Bed Mobility: Supine to  Sit;Sit to Supine     Supine to sit: Modified independent (Device/Increase time) Sit to supine: Modified independent (Device/Increase time)      Transfers Overall transfer level: Needs assistance Equipment used: Rolling Knippel (2 wheeled) Transfers: Sit to/from Stand Sit to Stand: Min assist         General transfer comment: poor hand placements despite verbal and tactile cues  Ambulation/Gait Ambulation/Gait assistance: Min assist Gait Distance (Feet): 80 Feet Assistive device: Rolling Grow (2 wheeled) Gait Pattern/deviations: Decreased step length - right;Decreased step length - left;Shuffle;Trunk flexed Gait velocity: decreased   General Gait Details: poor Borneman position and flexed gait but no buckling.  unsteady with hands on assist at all times due to quality and poor safety.   Stairs             Wheelchair Mobility    Modified Rankin (Stroke Patients Only)       Balance Overall balance assessment: Needs assistance Sitting-balance support: Feet supported Sitting balance-Leahy Scale: Good     Standing balance support: Bilateral upper extremity supported;During functional activity   Standing balance comment: Occasional min A for stability in standing                            Cognition Arousal/Alertness: Awake/alert Behavior During Therapy: WFL for tasks assessed/performed Overall Cognitive Status: No family/caregiver present to determine baseline cognitive functioning  Exercises Other Exercises Other Exercises: Supine and seated HEP education and review.    General Comments        Pertinent Vitals/Pain Pain Assessment: No/denies pain    Home Living                      Prior Function            PT Goals (current goals can now be found in the care plan section) Progress towards PT goals: Progressing toward goals    Frequency    Min 2X/week      PT  Plan Current plan remains appropriate    Co-evaluation              AM-PAC PT "6 Clicks" Mobility   Outcome Measure  Help needed turning from your back to your side while in a flat bed without using bedrails?: A Little Help needed moving from lying on your back to sitting on the side of a flat bed without using bedrails?: A Little Help needed moving to and from a bed to a chair (including a wheelchair)?: A Little Help needed standing up from a chair using your arms (e.g., wheelchair or bedside chair)?: A Little Help needed to walk in hospital room?: A Little Help needed climbing 3-5 steps with a railing? : A Lot 6 Click Score: 17    End of Session Equipment Utilized During Treatment: Gait belt Activity Tolerance: Patient tolerated treatment well Patient left: in bed;with bed alarm set;with call bell/phone within reach Nurse Communication: Mobility status       Time: 1100-1130 PT Time Calculation (min) (ACUTE ONLY): 30 min  Charges:  $Gait Training: 8-22 mins $Therapeutic Exercise: 8-22 mins                    Danielle Dess, PTA 02/23/20, 12:37 PM

## 2020-02-23 NOTE — Progress Notes (Signed)
PROGRESS NOTE    Thomas Coffey   FIE:[id number]  DOB: [birth year]  PCP: Patient, [name]    DOA: 01/28/2020 LOS: 25   Brief Narrative   Thomas Coffey is a 65 y.o. male with medical history significant of alcohol abuse, tobacco abuse, alcoholic pancreatitis, CAD, myocardial infarction, atrial fibrillation not on anticoagulants, who presented to the ED on 01/28/2020 with 10 days of cough and body aches, in addition to progressive abdominal pain with nausea vomiting since the day before.  Evaluation in the ED revealed acute pancreatitis with lipase 518.  COVID-19 positive.  Liver enzymes elevated.  Chest x-ray showed acute bronchitic changes.  Patient has had issues with dysphagia, and made NPO on multiple occasions.  This seems secondary to his encephalopathy.  SLP consulted.  Currently tolerating dysphagia 1 diet.   Has been off CIWA and Ativan and doing well.  Started on PRN Vistaril for anxiety/agitation as pt continues to try to get out of the bed.  He has mostly been redirectable by staff for the past few days.  Hospitalization prolonged due to difficult disposition.  Pt requires SNF placement, is profoundly weak.  Palliative discussions on GOC with family are ongoing.  May get PEG tube.     Assessment & Plan   Principal Problem:   Acute respiratory disease due to COVID-19 virus Active Problems:   Alcohol abuse   Hypomagnesemia   Acute pancreatitis   Abnormal LFTs   CAD (coronary artery disease)   Atrial fibrillation, chronic (HCC)   Tobacco abuse   COVID-19   Failure to thrive in adult   Acute metabolic encephalopathy hypoactive delirium. --after prolonged course of alcohol withdrawal.  CT head and MRI brain negative for any acute findings. Appears consistent with hypoactive delirium.  --mental status waxes and weans.   --D/c'ed all sedatives --mental status overall improved. Plan: --avoid all sedatives  Alcohol abuse / Uncomplicated Alcohol Withdrawal  -resolved Son reports pt very heavy drinker, all day every day, both beer and liquor for entire adult life. He was not aware of prior withdrawal or if hx of seizure or DT's --Tapered off Librium, completed on 1/23 -- Off CIWA since 1/27 -- stopped gabapentin Plan: --family to help with alcohol cessation after discharge  Aspiration pneumonia-resolved. Dysphagia  --completed 5 days course of Unasyn on 1/23 - resumed again on dysphagia 1 / pureed diet honey thick liquids 1/28, doing fairly well.   --Swallow function waxes and wanes with his level of alertness. --DG swallow eval 2/2 showed no overt aspiration.   --Pt has been eating better and feeding himself Plan: --Dys 1 diet with thin liquids.   --SLP to eval for upgrade to Dys 2. --Aspiration precautions and ensure pt alert enough to eat and drink --No need for PEG tube placement for now  Inadequate p.o. intake  Severe Malnutrition - seems acute on chronic issue based his malnutrition.   --Palliative care following, goals of care discussions ongoing.  GI was consulted for PEG tube, however, Pt has been eating better and feeding himself, and gaining weight. Plan: --Dys 1 diet with thin liquids --nutrition following --Hold PEG tube placement for now  Hypoglycemia -due to poor p.o. intake.   Treated with D5W. --pt eating better now.  No more need for D5 infusion.  Hypokalemia  / Hypomagnesemia / Hypophosphatemia  --monitor and replete PRN  COVID-19 infection  -presented with 10-day history of body aches and cough, tested positive in the ED.  Has not been  hypoxic.  Chest x-ray showed bronchitic changes. --Completed remdesivir, Steroids started on admission were stopped given no hypoxia -- Scheduled Mucinex, as needed albuterol -- Vitamin C, zinc -- Completed 5 day course doxycycline for secondary bronchitis  Acute pancreatitis due to alcohol abuse, Present on admission, resolved -- Diet as tolerated  Sinus  tachycardia - Resolved.   Due to Etoh withdrawal most likely.    Hx of Hypertension Was likely due to EtOH withdrawal.  --d/c amlodipine today due to soft BP --cont home Lopressor  Tobacco abuse  -nicotine patch.  Smoking cessation recommended.  Transaminitis -likely due to alcohol abuse.  Monitor CMP closely since on remdesivir. RUQ ultrasound showed likely hepatic steatosis, gall stones.  CAD -chronic --stable without active chest pain or acute ischemic changes on EKG.   --cont ASA 81  A. fib, chronic  - Has been normal sinus on tele, was tachycardic related to alcohol withdrawal, now rate controlled for most part.   --Not on anticoagulation or rate control meds --cont lopressor 25 mg BID    DVT prophylaxis: enoxaparin (LOVENOX) injection 40 mg Start: 01/28/20 2200   Diet:  Diet Orders (From admission, onward)    Start     Ordered   02/18/20 1542  DIET - DYS 1 Room service appropriate? Yes with Assist; Fluid consistency: Thin  Diet effective now       Comments: Extra gravy on meats, potatoes. Yogurts, puddings. Pt may have Oatmeal per Speech w/ butter and sugar per Speech.  NO STRAWS!  Question Answer Comment  Room service appropriate? Yes with Assist   Fluid consistency: Thin      02/18/20 1546            Code Status: Full Code    Subjective 02/23/20    Pt fed himself sitting at side of bed today.  No complaints.     Disposition Plan & Communication   DVT prophylaxis: Lovenox SQ Code Status: Full code  Family Communication:  Status is: inpatient Dispo:   The patient is from: home Anticipated d/c is to: SNF Anticipated d/c date is: whenever bed available. Patient currently is medically stable to d/c.   Consults, Procedures, Significant Events   Consultants:   Palliative care  Gastroenterology  Procedures:   None  Antimicrobials:  Anti-infectives (From admission, onward)   Start     Dose/Rate Route Frequency Ordered Stop   02/04/20  2200  Ampicillin-Sulbactam (UNASYN) 3 g in sodium chloride 0.9 % 100 mL IVPB        3 g 200 mL/hr over 30 Minutes Intravenous Every 6 hours 02/04/20 1320 02/07/20 2359   02/03/20 1015  amoxicillin-clavulanate (AUGMENTIN) 875-125 MG per tablet 1 tablet  Status:  Discontinued        1 tablet Oral Every 12 hours 02/03/20 0929 02/04/20 1320   01/29/20 1000  azithromycin (ZITHROMAX) tablet 250 mg  Status:  Discontinued       "Followed by" Linked Group Details   250 mg Oral Daily 01/28/20 1010 01/28/20 1025   01/29/20 1000  remdesivir 100 mg in sodium chloride 0.9 % 100 mL IVPB       "Followed by" Linked Group Details   100 mg 200 mL/hr over 30 Minutes Intravenous Daily 01/28/20 1024 02/02/20 0959   01/28/20 1200  remdesivir 200 mg in sodium chloride 0.9% 250 mL IVPB       "Followed by" Linked Group Details   200 mg 580 mL/hr over 30 Minutes Intravenous Once 01/28/20 1024 01/28/20  1318   01/28/20 1030  doxycycline (VIBRA-TABS) tablet 100 mg  Status:  Discontinued        100 mg Oral Every 12 hours 01/28/20 1025 02/01/20 1425   01/28/20 1015  azithromycin (ZITHROMAX) tablet 500 mg  Status:  Discontinued       "Followed by" Linked Group Details   500 mg Oral Daily 01/28/20 1010 01/28/20 1025        Objective   Vitals:   02/22/20 2337 02/23/20 0415 02/23/20 0808 02/23/20 1151  BP: 99/79 112/75 (!) 98/55 107/84  Pulse: (!) 105 (!) 105 95 (!) 110  Resp: 15 17 16 19   Temp: 98 F (36.7 C) 98.3 F (36.8 C) 98.3 F (36.8 C) 98.1 F (36.7 C)  TempSrc:  Oral    SpO2: 99% 100% 98% 98%  Weight:      Height:        Intake/Output Summary (Last 24 hours) at 02/23/2020 1539 Last data filed at 02/23/2020 1425 Gross per 24 hour  Intake 480 ml  Output 350 ml  Net 130 ml   Filed Weights   02/06/20 0500 02/13/20 0500 02/20/20 0454  Weight: 73 kg 74.8 kg 73.5 kg    Physical Exam:  Constitutional: NAD, alert, oriented, sitting at edge of bed feeing himself HEENT: conjunctivae and lids normal,  EOMI CV: No cyanosis.   RESP: normal respiratory effort, on RA Extremities: No effusions, edema in BLE SKIN: warm, dry Neuro: II - XII grossly intact.   Psych: Normal mood and affect.      Labs   Data Reviewed: I have personally reviewed following labs and imaging studies  CBC: Recent Labs  Lab 02/19/20 0358 02/20/20 0450 02/21/20 0446 02/22/20 0518 02/23/20 0520  WBC 10.7* 11.6* 10.7* 10.2 9.4  HGB 13.9 13.7 13.4 13.0 13.0  HCT 42.6 41.6 40.3 40.3 40.1  MCV 94.7 93.5 92.9 93.7 93.7  PLT 349 372 344 334 345   Basic Metabolic Panel: Recent Labs  Lab 02/17/20 0651 02/19/20 0358 02/20/20 0450 02/21/20 0446 02/22/20 0518 02/23/20 0520  NA 136 139 134* 138 136 137  K 4.8 4.4 4.4 4.5 4.1 4.0  CL 105 105 104 106 103 104  CO2 23 24 25 23 23 23   GLUCOSE 80 112* 98 132* 113* 161*  BUN 9 13 14 16 13 14   CREATININE 0.67 0.64 0.56* 0.71 0.58* 0.58*  CALCIUM 8.8* 9.1 9.4 9.5 9.6 9.2  MG 1.8 1.6* 1.9 1.7 1.7 1.7  PHOS 3.8  --  4.0 4.6 4.3  --    GFR: Estimated Creatinine Clearance: 93.3 mL/min (A) (by C-G formula based on SCr of 0.58 mg/dL (L)). Liver Function Tests: No results for input(s): AST, ALT, ALKPHOS, BILITOT, PROT, ALBUMIN in the last 168 hours. No results for input(s): LIPASE, AMYLASE in the last 168 hours. No results for input(s): AMMONIA in the last 168 hours. Coagulation Profile: No results for input(s): INR, PROTIME in the last 168 hours. Cardiac Enzymes: No results for input(s): CKTOTAL, CKMB, CKMBINDEX, TROPONINI in the last 168 hours. BNP (last 3 results) No results for input(s): PROBNP in the last 8760 hours. HbA1C: No results for input(s): HGBA1C in the last 72 hours. CBG: No results for input(s): GLUCAP in the last 168 hours. Lipid Profile: No results for input(s): CHOL, HDL, LDLCALC, TRIG, CHOLHDL, LDLDIRECT in the last 72 hours. Thyroid Function Tests: No results for input(s): TSH, T4TOTAL, FREET4, T3FREE, THYROIDAB in the last 72  hours. Anemia Panel: No results for input(s):  VITAMINB12, FOLATE, FERRITIN, TIBC, IRON, RETICCTPCT in the last 72 hours. Sepsis Labs: No results for input(s): PROCALCITON, LATICACIDVEN in the last 168 hours.  No results found for this or any previous visit (from the past 240 hour(s)).    Imaging Studies   No results found.   Medications   Scheduled Meds: . vitamin C  500 mg Oral Daily  . aspirin EC  81 mg Oral Daily  . enoxaparin (LOVENOX) injection  40 mg Subcutaneous Q24H  . feeding supplement  1 Container Oral BID BM  . magnesium oxide  400 mg Oral BID  . metoprolol tartrate  25 mg Oral BID  . multivitamin with minerals  1 tablet Oral Daily  . nicotine  21 mg Transdermal Daily  . potassium chloride  20 mEq Oral Daily  . senna-docusate  1 tablet Oral QHS  . zinc sulfate  220 mg Oral Daily   Continuous Infusions:     LOS: 25 days    Darlin Priestly, md [facility]  02/23/2020, 3:39 PM

## 2020-02-24 NOTE — Progress Notes (Signed)
Speech Language Pathology Treatment: Dysphagia  Patient Details Name: Thomas Coffey MRN: [id number] DOB: 21-Nov-1955 Today's Date: 02/24/2020 Time: 3419-6222 SLP Time Calculation (min) (ACUTE ONLY): 45 min  Assessment / Plan / Recommendation Clinical Impression  Pt was seen today per MD request for trial to upgrade oral diet to a consistency less pureed. Pt has been tolerating a pureed diet w/ thin liquids w/ aspiration precautions per chart, MD notes. He requested a diet upgrade per MD report. Per the MBSS on 2/2/222, pt presents w/ Mild-Mod oropharyngeal phase dysphagia; pharyngeal phase impacted by Min delay in pharyngeal swallow initiation as well as decreased peristalsis resulting in pharyngeal residue. Also during the Esophageal phase, min slower Cervical Esophageal clearing occurred below the UES -- Retrograde activity of a slight amount of bolus material from the Esophagus to the Pyriform Sinuses occurred intermittently. Dry swallows b/t trials appeared to reduce and/or clear both pharyngeal and Esophageal residue. 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 MD and Team agreed w/ trial of pureed diet w/ thin liquids Via Cup w/ Supervision and precautions at that time w/ monitoring for any negative sequelae of aspiration.   Pt seen at breakfast meal w/ trials of Minced foods, moistened well for ease of gumming/mashing. Pt has Dentures at bedside but stated the Dentures were NOT his Dentures and would not try them for this SLP. He stated he could eat "without them". He appears more verbally engaged and able to follow instructions w/ min cues than at [facility]. Pt is on  RA; wbc wnl.   Pt explained general aspiration precautions and agreed verbally to the need for following them especially sitting upright for all oral intake (in chair this meal) and to use f/u, Dry swallows to help clear his throat of any residue. Pt was given tray setup but then insisted on feeding himself trials of purees, minced/chopped soft foods, and sips of thin liquids via Cup (NO STRAWS). No immediate, overt clinical s/s of aspiration were noted w/ any consistency; respiratory status remained calm and unlabored, vocal quality clear b/t trials. NO straws were utiilized for better oral control. Oral phase appeared grossly adequate for bolus management and timely A-P transfer for swallowing; oral clearing achieved w/ all consistencies. Min increased Time needed for mashing and gumming of solids. Pt denied any problems.  Recommend upgrade to Dysphagia level 2 diet (Minced foods w/ added purees) w/ gravies added to moisten foods; Thin liquids. Pt stated he "liked" the Minced foods. Recommend general aspiration precautions; f/u, Dry swallows to clear pharyngeally b/t bites and sips; Pills Whole in Puree; tray setup and positioning assistance for meals. Pt is at risk for aspiration both during and AFTER the swallow d/t oropharyngeal dysphagia and any impact from Esophageal phase dysmotility -- ANY retrograde activity can increase risk for aspiration of REFLUX, Retrograde bolus movement. ST services can be available for any further education as needed while admitted. NSG updated. Precautions posted at bedside. MD/NSG updated.       HPI HPI: Pt is a 65 y.o. male with medical history significant of alcohol abuse, tobacco abuse, alcoholic pancreatitis, CAD, myocardial infarction, atrial fibrillation not on anticoagulants, who presents with cough, abdominal pain.  COVID-19 positive.  Liver enzymes elevated currently. Chest x-ray showed acute bronchitic changes.  On CIWA protocol with as needed Ativan -- reduce  Librium to 25 mg TID to reduce need for Ativan per MD notes.  Pt requires a Sitter d/t agitation and behavior.      SLP Plan  All goals met       Recommendations  Diet recommendations: Dysphagia 2 (fine chop);Thin liquid (w/ added purees) Liquids provided via: Cup;No straw Medication Administration: Whole meds with puree (vs Crushed in puree) Supervision: Staff to assist with self feeding;Intermittent supervision to cue for compensatory strategies Compensations: Minimize environmental distractions;Slow rate;Small sips/bites;Lingual sweep for clearance of pocketing;Multiple dry swallows after each bite/sip;Follow solids with liquid Postural Changes and/or Swallow Maneuvers: Seated upright 90 degrees;Upright 30-60 min after meal;Out of bed for meals                General recommendations:  (dietician f/u) Oral Care Recommendations: Oral care BID;Staff/trained caregiver to provide oral care Follow up Recommendations: None SLP Visit Diagnosis: Dysphagia, oropharyngeal phase (R13.12) (baseline Cognitive decline) Plan: All goals met       GO                 Orinda Kenner, MS, CCC-SLP Speech Language Pathologist [facility] [phone] [facility] 02/24/2020, 10:07 AM

## 2020-02-24 NOTE — Progress Notes (Signed)
PROGRESS NOTE    Thomas Coffey   SJG:[id number]  DOB: [birth year]  PCP: Patient, [name]    DOA: 01/28/2020 LOS: 26   Brief Narrative   Thomas Coffey is a 65 y.o. male with medical history significant of alcohol abuse, tobacco abuse, alcoholic pancreatitis, CAD, myocardial infarction, atrial fibrillation not on anticoagulants, who presented to the ED on 01/28/2020 with 10 days of cough and body aches, in addition to progressive abdominal pain with nausea vomiting since the day before.  Evaluation in the ED revealed acute pancreatitis with lipase 518.  COVID-19 positive.  Liver enzymes elevated.  Chest x-ray showed acute bronchitic changes.  Patient has had issues with dysphagia, and made NPO on multiple occasions.  This seems secondary to his encephalopathy.  SLP consulted.  Currently tolerating dysphagia 1 diet.   Has been off CIWA and Ativan and doing well.  Started on PRN Vistaril for anxiety/agitation as pt continues to try to get out of the bed.  He has mostly been redirectable by staff for the past few days.  Hospitalization prolonged due to difficult disposition.  Pt requires SNF placement, is profoundly weak.  Palliative discussions on GOC with family are ongoing.  May get PEG tube.     Assessment & Plan   Principal Problem:   Acute respiratory disease due to COVID-19 virus Active Problems:   Alcohol abuse   Hypomagnesemia   Acute pancreatitis   Abnormal LFTs   CAD (coronary artery disease)   Atrial fibrillation, chronic (HCC)   Tobacco abuse   COVID-19   Failure to thrive in adult   Acute metabolic encephalopathy hypoactive delirium. --after prolonged course of alcohol withdrawal.  CT head and MRI brain negative for any acute findings. Appears consistent with hypoactive delirium.  --mental status waxes and weans.   --D/c'ed all sedatives --mental status overall improved. Plan: --avoid all sedatives  Alcohol abuse / Uncomplicated Alcohol Withdrawal  -resolved Son reports pt very heavy drinker, all day every day, both beer and liquor for entire adult life. He was not aware of prior withdrawal or if hx of seizure or DT's --Tapered off Librium, completed on 1/23 -- Off CIWA since 1/27 -- stopped gabapentin Plan: --family to help with alcohol cessation after discharge  Aspiration pneumonia-resolved. Dysphagia  --completed 5 days course of Unasyn on 1/23 - resumed again on dysphagia 1 / pureed diet honey thick liquids 1/28, doing fairly well.   --Swallow function waxes and wanes with his level of alertness. --DG swallow eval 2/2 showed no overt aspiration.   --Pt has been eating better and feeding himself Plan: --diet upgrade to Dys 2 by SLP today --Aspiration precautions and ensure pt alert enough to eat and drink --No need for PEG tube placement for now  Inadequate p.o. intake  Severe Malnutrition - seems acute on chronic issue based his malnutrition.   --Palliative care following, goals of care discussions ongoing.  GI was consulted for PEG tube, however, Pt has been eating better and feeding himself, and gaining weight. Plan: --diet upgrade to Dys 2 by SLP today --nutrition following --Hold PEG tube placement for now  Hypoglycemia -due to poor p.o. intake.   Treated with D5W. --pt eating better now.  No more need for D5 infusion.  Hypokalemia  / Hypomagnesemia / Hypophosphatemia  --monitor and replete PRN  COVID-19 infection  -presented with 10-day history of body aches and cough, tested positive in the ED.  Has not been hypoxic.  Chest x-ray showed bronchitic  changes. --Completed remdesivir, Steroids started on admission were stopped given no hypoxia -- Scheduled Mucinex, as needed albuterol -- Vitamin C, zinc -- Completed 5 day course doxycycline for secondary bronchitis  Acute pancreatitis due to alcohol abuse, Present on admission, resolved -- Diet as tolerated  Sinus tachycardia - Resolved.   Due to Etoh  withdrawal most likely.    Hx of Hypertension Was likely due to EtOH withdrawal.  --d/c amlodipine today due to soft BP --cont home Lopressor  Tobacco abuse  -nicotine patch.  Smoking cessation recommended.  Transaminitis -likely due to alcohol abuse.  Monitor CMP closely since on remdesivir. RUQ ultrasound showed likely hepatic steatosis, gall stones.  CAD -chronic --stable without active chest pain or acute ischemic changes on EKG.   --cont ASA 81  A. fib, chronic  - Has been normal sinus on tele, was tachycardic related to alcohol withdrawal, now rate controlled for most part.   --Not on anticoagulation or rate control meds --cont lopressor 25 mg BID    DVT prophylaxis: enoxaparin (LOVENOX) injection 40 mg Start: 01/28/20 2200   Diet:  Diet Orders (From admission, onward)    Start     Ordered   02/24/20 0834  DIET DYS 2 Room service appropriate? Yes with Assist; Fluid consistency: Thin  Diet effective now       Comments: Extra Gravy on meats, potatoes.  NO Straws.  Question Answer Comment  Room service appropriate? Yes with Assist   Fluid consistency: Thin      02/24/20 0834            Code Status: Full Code    Subjective 02/24/20    Pt tolerated Dys 2 diet, fed himself, ate well.  For the first time with me, pt complained of back pain.    Disposition Plan & Communication   DVT prophylaxis: Lovenox SQ Code Status: Full code  Family Communication:  Status is: inpatient Dispo:   The patient is from: home Anticipated d/c is to: SNF Anticipated d/c date is: whenever bed available. Patient currently is medically stable to d/c.   Consults, Procedures, Significant Events   Consultants:   Palliative care  Gastroenterology  Procedures:   None  Antimicrobials:  Anti-infectives (From admission, onward)   Start     Dose/Rate Route Frequency Ordered Stop   02/04/20 2200  Ampicillin-Sulbactam (UNASYN) 3 g in sodium chloride 0.9 % 100 mL IVPB         3 g 200 mL/hr over 30 Minutes Intravenous Every 6 hours 02/04/20 1320 02/07/20 2359   02/03/20 1015  amoxicillin-clavulanate (AUGMENTIN) 875-125 MG per tablet 1 tablet  Status:  Discontinued        1 tablet Oral Every 12 hours 02/03/20 0929 02/04/20 1320   01/29/20 1000  azithromycin (ZITHROMAX) tablet 250 mg  Status:  Discontinued       "Followed by" Linked Group Details   250 mg Oral Daily 01/28/20 1010 01/28/20 1025   01/29/20 1000  remdesivir 100 mg in sodium chloride 0.9 % 100 mL IVPB       "Followed by" Linked Group Details   100 mg 200 mL/hr over 30 Minutes Intravenous Daily 01/28/20 1024 02/02/20 0959   01/28/20 1200  remdesivir 200 mg in sodium chloride 0.9% 250 mL IVPB       "Followed by" Linked Group Details   200 mg 580 mL/hr over 30 Minutes Intravenous Once 01/28/20 1024 01/28/20 1318   01/28/20 1030  doxycycline (VIBRA-TABS) tablet 100 mg  Status:  Discontinued        100 mg Oral Every 12 hours 01/28/20 1025 02/01/20 1425   01/28/20 1015  azithromycin (ZITHROMAX) tablet 500 mg  Status:  Discontinued       "Followed by" Linked Group Details   500 mg Oral Daily 01/28/20 1010 01/28/20 1025        Objective   Vitals:   02/24/20 0641 02/24/20 0912 02/24/20 1106 02/24/20 1556  BP: (!) 87/66 102/68 107/78 99/72  Pulse: (!) 108 (!) 108 (!) 113 (!) 111  Resp: 20 20 20 16   Temp:  98.2 F (36.8 C) 98.2 F (36.8 C) 98.1 F (36.7 C)  TempSrc:      SpO2: 98% 96% 97% 97%  Weight:      Height:        Intake/Output Summary (Last 24 hours) at 02/24/2020 1914 Last data filed at 02/24/2020 04/23/2020 Gross per 24 hour  Intake 240 ml  Output 225 ml  Net 15 ml   Filed Weights   02/06/20 0500 02/13/20 0500 02/20/20 0454  Weight: 73 kg 74.8 kg 73.5 kg    Physical Exam:  Constitutional: NAD, alert, oriented to person and place. HEENT: conjunctivae and lids normal, EOMI CV: No cyanosis.   RESP: normal respiratory effort, on RA Extremities: No effusions, edema in BLE SKIN:  warm, dry Neuro: II - XII grossly intact.   Psych: Normal mood and affect.     Labs   Data Reviewed: I have personally reviewed following labs and imaging studies  CBC: Recent Labs  Lab 02/19/20 0358 02/20/20 0450 02/21/20 0446 02/22/20 0518 02/23/20 0520  WBC 10.7* 11.6* 10.7* 10.2 9.4  HGB 13.9 13.7 13.4 13.0 13.0  HCT 42.6 41.6 40.3 40.3 40.1  MCV 94.7 93.5 92.9 93.7 93.7  PLT 349 372 344 334 345   Basic Metabolic Panel: Recent Labs  Lab 02/19/20 0358 02/20/20 0450 02/21/20 0446 02/22/20 0518 02/23/20 0520  NA 139 134* 138 136 137  K 4.4 4.4 4.5 4.1 4.0  CL 105 104 106 103 104  CO2 24 25 23 23 23   GLUCOSE 112* 98 132* 113* 161*  BUN 13 14 16 13 14   CREATININE 0.64 0.56* 0.71 0.58* 0.58*  CALCIUM 9.1 9.4 9.5 9.6 9.2  MG 1.6* 1.9 1.7 1.7 1.7  PHOS  --  4.0 4.6 4.3  --    GFR: Estimated Creatinine Clearance: 93.3 mL/min (A) (by C-G formula based on SCr of 0.58 mg/dL (L)). Liver Function Tests: No results for input(s): AST, ALT, ALKPHOS, BILITOT, PROT, ALBUMIN in the last 168 hours. No results for input(s): LIPASE, AMYLASE in the last 168 hours. No results for input(s): AMMONIA in the last 168 hours. Coagulation Profile: No results for input(s): INR, PROTIME in the last 168 hours. Cardiac Enzymes: No results for input(s): CKTOTAL, CKMB, CKMBINDEX, TROPONINI in the last 168 hours. BNP (last 3 results) No results for input(s): PROBNP in the last 8760 hours. HbA1C: No results for input(s): HGBA1C in the last 72 hours. CBG: No results for input(s): GLUCAP in the last 168 hours. Lipid Profile: No results for input(s): CHOL, HDL, LDLCALC, TRIG, CHOLHDL, LDLDIRECT in the last 72 hours. Thyroid Function Tests: No results for input(s): TSH, T4TOTAL, FREET4, T3FREE, THYROIDAB in the last 72 hours. Anemia Panel: No results for input(s): VITAMINB12, FOLATE, FERRITIN, TIBC, IRON, RETICCTPCT in the last 72 hours. Sepsis Labs: No results for input(s): PROCALCITON,  LATICACIDVEN in the last 168 hours.  No results found for this or any  previous visit (from the past 240 hour(s)).    Imaging Studies   No results found.   Medications   Scheduled Meds: . vitamin C  500 mg Oral Daily  . aspirin EC  81 mg Oral Daily  . enoxaparin (LOVENOX) injection  40 mg Subcutaneous Q24H  . feeding supplement  1 Container Oral BID BM  . magnesium oxide  400 mg Oral BID  . metoprolol tartrate  25 mg Oral BID  . multivitamin with minerals  1 tablet Oral Daily  . nicotine  21 mg Transdermal Daily  . potassium chloride  20 mEq Oral Daily  . senna-docusate  1 tablet Oral QHS  . zinc sulfate  220 mg Oral Daily   Continuous Infusions:     LOS: 26 days    Darlin Priestly, md [facility]  02/24/2020, 7:14 PM

## 2020-02-24 NOTE — Progress Notes (Signed)
   02/24/20 0434 02/24/20 0641  Assess: MEWS Score  Temp 98.6 F (37 C)  --   BP 99/71 (!) 87/66  Pulse Rate (!) 108 (!) 108  Resp  --  20  SpO2 95 % 98 %  O2 Device Room [employer]

## 2020-02-25 NOTE — TOC Progression Note (Signed)
Transition of Care [facility]) - Progression Note    Patient Details  Name: BURLON CENTRELLA MRN: [id number] Date of Birth: [birth year]  Transition of Care [facility]) CM/SW [place], RN Phone Number: 02/25/2020, 10:04 AM  Clinical Narrative:   RNCM met with patient at bedside to discuss bed offer of [facility], patient reports he is not familiar with this facility but would like to talk to his sons. RNCM let patient with Medicare.gov facility information.     Expected Discharge Plan: [place] Barriers to Discharge: Continued Medical Work up  Expected Discharge Plan and Services Expected Discharge Plan: [place] arrangements for the past 2 months: Single Family Home                                       Social Determinants of Health (SDOH) Interventions    Readmission Risk Interventions No flowsheet data found.

## 2020-02-25 NOTE — Progress Notes (Signed)
   02/24/20 0434  Assess: MEWS Score  Temp 98.6 F (37 C)  BP 99/71  Pulse Rate (!) 108  SpO2 95 %  O2 Device Room Air  Assess: MEWS Score  MEWS Temp 0  MEWS Systolic 1  MEWS Pulse 1  MEWS RR 0  MEWS LOC 0  MEWS Score 2  MEWS Score Color Yellow  Assess: if the MEWS score is Yellow or Red  Were vital signs taken at a resting state? Yes  Focused Assessment No change from prior assessment  Early Detection of Sepsis Score *See Row Information* Low  MEWS guidelines implemented *See Row Information* Yes  Treat  MEWS Interventions Escalated (See documentation below)  Pain Scale 0-10  Pain Score 0  Take Vital Signs  Increase Vital Sign Frequency  Yellow: Q 2hr X 2 then Q 4hr X 2, if remains yellow, continue Q 4hrs  Escalate  MEWS: Escalate Yellow: discuss with charge nurse/RN and consider discussing with provider and RRT  Notify: Charge Nurse/RN  Name of Charge Nurse/RN Notified Gavin Pound  Date Charge Nurse/RN Notified 02/24/20  Time Charge Nurse/RN Notified 0645  Document  Progress note created (see row info) Yes  Inserted for [employer]

## 2020-02-25 NOTE — TOC Progression Note (Addendum)
Transition of Care [facility]) - Progression Note    Patient Details  Name: Thomas Coffey MRN: [id number] Date of Birth: January 17, 1956  Transition of Care [facility]) CM/SW Contact  Trenton Founds, RN Phone Number: 02/25/2020, 2:34 PM  Clinical Narrative:   RNCM received return call from patient's son [name], He reports that his dad has decided to accept bed at [facility] in [place].  RNCM accepted bed in hub and reached out and left message for their admissions co-ordinator Teena.   3:35pm- RNCM reached out to [facility] again by telephone. She reports that at this time they are unable to accept patient into facility. She reports that per her DON they are unable to accept anyone with recent history of alcohol or smoking. They rescinded the bed offer.     Expected Discharge Plan: Home w Home Health Services Barriers to Discharge: Continued Medical Work up  Expected Discharge Plan and Services Expected Discharge Plan: Home w Home Health Services       Living arrangements for the past 2 months: Single Family Home                                       Social Determinants of Health (SDOH) Interventions    Readmission Risk Interventions No flowsheet data found.

## 2020-02-25 NOTE — Progress Notes (Signed)
Nutrition Follow-up  DOCUMENTATION CODES:   Severe malnutrition in context of social or environmental circumstances  INTERVENTION:  Continue Boost Breeze po BID, each supplement provides 250 kcal and 9 grams of protein.   Continue Magic cup TID with meals, each supplement provides 290 kcal and 9 grams of protein.  Continue MVI po daily.  NUTRITION DIAGNOSIS:   Severe Malnutrition related to social / environmental circumstances (EtOH abuse, suspected inadequate oral intake) as evidenced by severe fat depletion,severe muscle depletion.  Ongoing.  GOAL:   Patient will meet greater than or equal to 90% of their needs  Met.  MONITOR:   PO intake,Supplement acceptance,Labs,Weight trends,Skin,I & O's  REASON FOR ASSESSMENT:   NPO/Clear Liquid Diet,Consult Assessment of nutrition requirement/status,Poor PO,Calorie Count  ASSESSMENT:   65 year old male with PMHx of EtOH abuse, tobacco abuse, alcoholic pancreatitis, CAD, MI, A-fib not on anticoagulants admitted with AXENM-07, acute metabolic encephalopathy most likely due to EtOH withdrawal, aspiration PNA, acute pancreatitis now resolved.   1/24 pt made NPO 1/26 diet advanced to dysphagia 1 with honey-thick 1/27 pt made NPO 1/28 diet advanced back to dysphagia 1 with honey-thick 2/2 s/p MBS diet changed to dysphagia 1 with thin liquids 2/8 diet upgraded to dysphagia 2 with thin liquids  Met with patient at bedside. 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 Patient reports his appetite remains good and he is eating well at meals. He is eating 100% of most of his meals but he did eat about 60% of his breakfast today. He prefers [employer] of [employer] and is drinking them BID. He enjoys Merchant navy officer supplements. In the past 24 hours patient has had approximately 2558 kcal kcal (>100% estimated needs) and 110 grams of protein (>100% estimated needs.).   Medications reviewed and include: Vitamin C 500 mg daily, magnesium oxide 400  mg BID, MVI daily, nicotine patch, senna-docusate 1 tablet QHS, zinc sulfate 220 mg daily.  Labs reviewed: Creatinine 0.58.  I/O: 450 mL UOP yesterday + 1 occurrence unmeasured UOP  Weight trend: 73.5 kg on 2/4; +0.5 kg from 1/21  Diet Order:   Diet Order            DIET DYS 2 Room service appropriate? Yes with Assist; Fluid consistency: Thin  Diet effective now                EDUCATION NEEDS:   No education needs have been identified at this time  Skin:  Skin Assessment: Reviewed RN Assessment  Last BM:  02/23/2020 - medium type 4  Height:   Ht Readings from Last 1 Encounters:  01/28/20 5' 9"  (1.753 m)   Weight:   Wt Readings from Last 1 Encounters:  02/20/20 73.5 kg   Ideal Body Weight:  72.7 kg  BMI:  Body mass index is 23.92 kg/m.  Estimated Nutritional Needs:   Kcal:  1800-2000  Protein:  90-100 grams  Fluid:  1.8-2 L/day  Jacklynn Barnacle, MS, RD, LDN Pager number available on Amion

## 2020-02-25 NOTE — Progress Notes (Signed)
Physical Therapy Treatment Patient Details Name: Thomas Coffey MRN: [id number] DOB: [birth year] Today's Date: 02/25/2020    History of Present Illness Thomas Coffey is a 65 y.o. male with medical history significant of alcohol abuse, tobacco abuse, alcoholic pancreatitis, CAD, myocardial infarction, atrial fibrillation not on anticoagulants, who presented to the ED on 01/28/2020 with 10 days of cough and body aches, in addition to progressive abdominal pain with nausea vomiting since the day before.     Evaluation in the ED revealed acute pancreatitis and COVID-19 positive. MD assessment includes: Acute metabolic encephalopathy, alcohol abuse, aspiration PNA now resolved, severe malnutrition, hypoglycemia, hypokalemia, Covid-19, acute pancreatitis, sinus tachycardia, HTN, and transaminitis.    PT Comments    Pt was pleasant and motivated to participate during the session.  Pt made very good progress towards goals this session.  Pt did not require physical assist with bed mobility or transfers and was able to ambulate almost 200' with no adverse symptoms and with slow cadence but with no LOB.  Pt did tend to ambulate too far behind the RW which improved with cuing but only momentarily with poor carryover.  Pt was able to ascend and descend a large step with a RW with fair eccentric and concentric control.  Pt's SpO2 and HR were both WNL during the session with no adverse symptoms noted.  Pt will benefit from HHPT services upon discharge to safely address deficits listed in patient problem list for decreased caregiver assistance and eventual return to PLOF.     Follow Up Recommendations  Home health PT;Supervision/Assistance - 24 hour     Equipment Recommendations  Rolling Wittler with 5" wheels (Unsure if pt currently has access to a RW, need to confirm)    Recommendations for Other Services       Precautions / Restrictions Precautions Precautions: Fall Restrictions Weight Bearing  Restrictions: No    Mobility  Bed Mobility Overal bed mobility: Modified Independent             General bed mobility comments: Extra time and effort and use of bed rail to complete sup to sit but no assist needed  Transfers Overall transfer level: Needs assistance Equipment used: Rolling Jacober (2 wheeled) Transfers: Sit to/from Stand Sit to Stand: Supervision         General transfer comment: Pt required min verbal cues for hand placement during transfers but required no physical assistance and was steady upon coming to initial standing position  Ambulation/Gait Ambulation/Gait assistance: Min guard Gait Distance (Feet): 180 Feet Assistive device: Rolling Yeater (2 wheeled) Gait Pattern/deviations: Decreased step length - right;Decreased step length - left;Trunk flexed;Step-through pattern Gait velocity: decreased   General Gait Details: Slow cadence but steady with max verbal cuing for upright posture and amb closer to the RW   Stairs Stairs: Yes Stairs assistance: Min guard Stair Management: With Crosley;Step to pattern;Forwards Number of Stairs: 1 General stair comments: Pt able to ascend and descend one large step with a RW with fair control and stability   Wheelchair Mobility    Modified Rankin (Stroke Patients Only)       Balance Overall balance assessment: Needs assistance Sitting-balance support: Feet supported Sitting balance-Leahy Scale: Good     Standing balance support: Bilateral upper extremity supported;During functional activity Standing balance-Leahy Scale: Fair                              Cognition Arousal/Alertness: Awake/alert Behavior  During Therapy: WFL for tasks assessed/performed;Flat affect Overall Cognitive Status: No family/caregiver present to determine baseline cognitive functioning                                 General Comments: Pt communicated very little verbally during the session but was  appropriate and followed all commands well.      Exercises Total Joint Exercises Ankle Circles/Pumps: AROM;Strengthening;Both;10 reps Towel Squeeze: Strengthening;Both;10 reps Hip ABduction/ADduction: AROM;Strengthening;Both;10 reps Straight Leg Raises: AROM;Strengthening;Both;10 reps Long Arc Quad: AROM;Strengthening;Both;10 reps;15 reps    General Comments        Pertinent Vitals/Pain Pain Assessment: No/denies pain    Home Living                      Prior Function            PT Goals (current goals can now be found in the care plan section) Progress towards PT goals: Progressing toward goals    Frequency    Min 2X/week      PT Plan Discharge plan needs to be updated    Co-evaluation              AM-PAC PT "6 Clicks" Mobility   Outcome Measure  Help needed turning from your back to your side while in a flat bed without using bedrails?: None Help needed moving from lying on your back to sitting on the side of a flat bed without using bedrails?: None Help needed moving to and from a bed to a chair (including a wheelchair)?: A Little Help needed standing up from a chair using your arms (e.g., wheelchair or bedside chair)?: A Little Help needed to walk in hospital room?: A Little Help needed climbing 3-5 steps with a railing? : A Little 6 Click Score: 20    End of Session Equipment Utilized During Treatment: Gait belt Activity Tolerance: Patient tolerated treatment well Patient left: in chair;with chair alarm set;with call bell/phone within reach Nurse Communication: Mobility status PT Visit Diagnosis: Unsteadiness on feet (R26.81);Other abnormalities of gait and mobility (R26.89);Muscle weakness (generalized) (M62.81);Difficulty in walking, not elsewhere classified (R26.2)     Time: 7169-6789 PT Time Calculation (min) (ACUTE ONLY): 26 min  Charges:  $Gait Training: 8-22 mins $Therapeutic Exercise: 8-22 mins                     D. Scott  Jadie Comas PT, DPT 02/25/20, 3:46 PM

## 2020-02-25 NOTE — Progress Notes (Signed)
[facility]  - [place] at [facility]   PATIENT NAME: Thomas Coffey    MR#:  [id number]  DATE OF BIRTH:  07-Dec-1955  SUBJECTIVE:   Eating lunch. He ate more than half of his lunch. Drinking his boost. Denies any complaints. Wondering when he can go home. REVIEW OF SYSTEMS:   Review of Systems  Constitutional: Negative for chills, fever and weight loss.  HENT: Negative for ear discharge, ear pain and nosebleeds.   Eyes: Negative for blurred vision, pain and discharge.  Respiratory: Negative for sputum production, shortness of breath, wheezing and stridor.   Cardiovascular: Negative for chest pain, palpitations, orthopnea and PND.  Gastrointestinal: Negative for abdominal pain, diarrhea, nausea and vomiting.  Genitourinary: Negative for frequency and urgency.  Musculoskeletal: Negative for back pain and joint pain.  Neurological: Positive for weakness. Negative for sensory change, speech change and focal weakness.  Psychiatric/Behavioral: Negative for depression and hallucinations. The patient is not nervous/anxious.    Tolerating Diet:yes Tolerating PT: rehab  DRUG ALLERGIES:  No Known Allergies  VITALS:  Blood pressure 100/67, pulse 99, temperature 98.3 F (36.8 C), resp. rate 16, height 5\' 9"  (1.753 m), weight 73.5 kg, SpO2 98 %.  PHYSICAL EXAMINATION:   Physical Exam  GENERAL:  65 y.o.-year-old patient lying in the bed with no acute distress. Thin, fraile LUNGS: Normal breath sounds bilaterally, no wheezing, rales, rhonchi. No use of accessory muscles of respiration.  CARDIOVASCULAR: S1, S2 normal. No murmurs, rubs, or gallops.  ABDOMEN: Soft, nontender, nondistended. Bowel sounds present. No organomegaly or mass.  EXTREMITIES: No cyanosis, clubbing or edema b/l.    NEUROLOGIC: Cranial nerves II through XII are intact. No focal Motor or sensory deficits b/l.   PSYCHIATRIC:  patient is alert and oriented x 2  SKIN: No obvious rash, lesion, or ulcer.    LABORATORY PANEL:  CBC Recent Labs  Lab 02/23/20 0520  WBC 9.4  HGB 13.0  HCT 40.1  PLT 345    Chemistries  Recent Labs  Lab 02/23/20 0520  NA 137  K 4.0  CL 104  CO2 23  GLUCOSE 161*  BUN 14  CREATININE 0.58*  CALCIUM 9.2  MG 1.7   Cardiac Enzymes No results for input(s): TROPONINI in the last 168 hours. RADIOLOGY:  No results found. ASSESSMENT AND PLAN:  STYLES FAMBRO a 65 y.o.malewith medical history significant ofalcohol abuse, tobacco abuse, alcoholic pancreatitis, CAD, myocardial infarction, atrial fibrillationnoton anticoagulants, who presented to the ED on 01/28/2020 with 10 days of cough and body aches, in addition to progressive abdominal pain with nausea vomiting since the day before.  Acute metabolic encephalopathy hypoactive delirium. --after prolonged course of alcohol withdrawal.  CT head and MRI brain negative for any acute findings. Appears consistent with hypoactive delirium.  --mental status waxes and weans.   --D/c'ed all sedatives --02/25/20 mental status overall improved. Eating very well. Per RD 60% of meals eaten today. Tolerating po BOOST  Alcohol abuse / Uncomplicated Alcohol Withdrawal-resolved -Son reports pt very heavy drinker, all day every day, both beer and liquor for entire adult life. --Tapered off Librium, completed on 1/23 -- Off CIWA since 1/27 -- stopped gabapentin --family to help with alcohol cessation after discharge  Aspiration pneumonia-resolved. Dysphagia  --completed 5 days course of Unasyn on 1/23 - resumed again on dysphagia 1 / pureed diet honey thick liquids 1/28, doing fairly well.   --Swallow function waxes and wanes with his level of alertness. --DG swallow eval 2/2 showed no overt  aspiration.   --Pt has been eating better and feeding himself --diet upgrade to Dys 2 by SLP  --Aspiration precautions and ensure pt alert enough to eat and drink -- 2/9-- eating well. So far 60% po  intake  Inadequate p.o. intake Severe Malnutrition Nutrition Status: Nutrition Problem: Severe Malnutrition Etiology: social / environmental circumstances (EtOH abuse, suspected inadequate oral intake) Signs/Symptoms: severe fat depletion,severe muscle depletion Interventions: Refer to RD note for recommendations -Pt has been eating better and feeding himself, and gaining weight. --diet upgrade to Dys 2 by SLP  --Hold PEG tube placement for now  Hypokalemia / Hypomagnesemia / Hypophosphatemia  --reoslved  COVID-19 infection -presented with 10-day history of body aches and cough, tested positive in the ED. Has not been hypoxic. Chest x-ray showed bronchitic changes. --Completed remdesivir, Steroids started on admission were stopped given no hypoxia -- Scheduled Mucinex, as needed albuterol -- Vitamin C, zinc -- Completed 5 day course doxycycline for secondary bronchitis  Acute pancreatitisdue to alcohol abuse, Present on admission, resolved -- Diet as tolerated  Hx of Hypertension Was likely due to EtOH withdrawal.  --cont home Lopressor  Tobacco abuse -nicotine patch. Smoking cessation recommended.  Transaminitis-likely due to alcohol abuse.  --RUQ ultrasound showed likely hepatic steatosis, gall stones.  CAD-chronic --stable without active chest pain or acute ischemic changes on EKG.  --cont ASA 81  A. fib, chronic - Has been normal sinus on tele, was tachycardic related to alcohol withdrawal, now rate controlled for most part.   --Not on anticoagulation or rate control meds --cont lopressor 25 mg BID    DVT prophylaxis: enoxaparin (LOVENOX) injection 40 mg Start: 01/28/20 2200   Diet:     Diet Orders (From admission, onward)           Start     Ordered    02/24/20 0834  DIET DYS 2 Room service appropriate? Yes with Assist; Fluid consistency: Thin  Diet effective now       Comments: Extra Gravy on meats, potatoes.  NO Straws.   Question Answer Comment  Room service appropriate? Yes with Assist   Fluid consistency: Thin      02/24/20 0834             Code Status: Full Code     Pt tolerated Dys 2 diet, fed himself, ate well.   DVT prophylaxis: Lovenox SQ Code Status: Full code  Family Communication: spoke with Antonio son. Left msg for [name] Status is: inpatient Dispo:   The patient is from: home Anticipated d/c is to: SNF Anticipated d/c date is: patient has a bed offer at [facility] to [facility]. I spoke with son Zoe Lan to discuss with hys brother and consider rehab. Patient does not have many offers. Other option is home with home health. Patient currently is medically stable to d/c  Level of care: Med-Surg Status is: Inpatient           TOTAL TIME TAKING CARE OF THIS PATIENT: *25* minutes.  >50% time spent on counselling and coordination of care  Note: This dictation was prepared with Dragon dictation along with smaller phrase technology. Any transcriptional errors that result from this process are unintentional.  Enedina Finner M.D    [facility]   CC: Primary care physician; Patient, No Pcp PerPatient ID: Sherlyn Lick, male   DOB: Oct 18, 1955, 65 y.o.   MRN: [id number]

## 2020-02-26 DIAGNOSIS — G9341 Metabolic encephalopathy: Secondary | ICD-10-CM

## 2020-02-26 LAB — MAGNESIUM: Magnesium: 1.6 mg/dL — ABNORMAL LOW (ref 1.7–2.4)

## 2020-02-26 MED ORDER — MAGNESIUM OXIDE 400 (241.3 MG) MG PO TABS: 400.0000 mg | ORAL_TABLET | Freq: Two times a day (BID) | ORAL | 2 refills | Status: DC

## 2020-02-26 MED ORDER — POTASSIUM CHLORIDE 20 MEQ PO PACK: 20.0000 meq | PACK | Freq: Every day | ORAL | 0 refills | Status: DC

## 2020-02-26 MED ORDER — ADULT MULTIVITAMIN W/MINERALS CH: 1.0000 | ORAL_TABLET | Freq: Every day | ORAL | 1 refills | Status: DC

## 2020-02-26 MED ORDER — METOPROLOL TARTRATE 25 MG PO TABS: 25.0000 mg | ORAL_TABLET | Freq: Two times a day (BID) | ORAL | 2 refills | Status: DC

## 2020-02-26 NOTE — Discharge Instructions (Signed)
Abstain from drinking ETOH  Patient to f/u with PCP listed on Medicaid card

## 2020-02-26 NOTE — Plan of Care (Signed)
  Problem: Clinical Measurements: Goal: Ability to maintain clinical measurements within normal limits will improve Outcome: Adequate for Discharge Goal: Will remain free from infection Outcome: Adequate for Discharge Goal: Diagnostic test results will improve Outcome: Adequate for Discharge Goal: Respiratory complications will improve Outcome: Adequate for Discharge Goal: Cardiovascular complication will be avoided Outcome: Adequate for Discharge   Problem: Clinical Measurements: Goal: Will remain free from infection Outcome: Adequate for Discharge   Problem: Activity: Goal: Risk for activity intolerance will decrease Outcome: Adequate for Discharge   Problem: Nutrition: Goal: Adequate nutrition will be maintained Outcome: Adequate for Discharge

## 2020-02-26 NOTE — Discharge Summary (Signed)
[facility] - [place] at [facility]   PATIENT NAME: Thomas Coffey    MR#:  [id number]  DATE OF BIRTH:  1955/02/12  DATE OF ADMISSION:  01/28/2020 ADMITTING PHYSICIAN: Pennie Banter, DO  DATE OF DISCHARGE: 02/26/2020  PRIMARY CARE PHYSICIAN: Patient, [name]    ADMISSION DIAGNOSIS:  Alcohol-induced acute pancreatitis, unspecified complication status [K85.20] COVID-19 virus infection [U07.1] Acute respiratory disease due to COVID-19 virus [U07.1, J06.9] COVID-19 [U07.1]  DISCHARGE DIAGNOSIS:  Alcohol induced Pancreatitis Acute metabolic encephalopathy  SECONDARY DIAGNOSIS:   Past Medical History:  Diagnosis Date  . MI (myocardial infarction) [facility])     HOSPITAL COURSE:   Yue Glasheen Walkeris a 65 y.o.malewith medical history significant ofalcohol abuse, tobacco abuse, alcoholic pancreatitis, CAD, myocardial infarction, atrial fibrillationnoton anticoagulants, who presented to the ED on 01/28/2020 with 10 days of cough and body aches, in addition to progressive abdominal pain with nausea vomiting since the day before.  Acute metabolic encephalopathy --after prolonged course of alcohol withdrawal.  --CT head and MRI brain negative for any acute findings. --Appears consistent with hypoactive delirium.  --mental status much improved --D/c'ed all sedatives --02/25/20 mental status overall improved. Eating very well. Per RD 60% of meals eaten today. Tolerating po BOOST  Alcohol abuse / Uncomplicated Alcohol Withdrawal-resolved -Son reports pt very heavy drinker, all day every day, both beer and liquor for entire adult life. --Tapered off Librium, completed on 1/23 -- Off CIWA since 1/27 -- stopped gabapentin --family to help with alcohol cessation after discharge--d/w [name]  Aspiration pneumonia-resolved. Dysphagia  --completed 5 days course of Unasyn on 1/23 - resumed again on dysphagia 1 / pureed diet honey thick liquids 1/28, doing fairly  well.  --DG swallow eval 2/2 showed no overt aspiration.  --Pt has been eating better and feeding himself --diet upgrade to Dys 2 by SLP  --Aspiration precautions and ensure pt alert enough to eat and drink -- 2/9-- eating well. So far 60% po intake  Severe Malnutrition Nutrition Status: Nutrition Problem: Severe Malnutrition Etiology: social / environmental circumstances (EtOH abuse, suspected inadequate oral intake) Signs/Symptoms: severe fat depletion,severe muscle depletion Interventions: Refer to RD note for recommendations -Pt has been eating better and feeding himself, and gaining weight. --diet upgrade to Dys 2 by SLP  --po intake improving. Family informed to bring protein shakes  Hypokalemia / Hypomagnesemia / Hypophosphatemia  --better  COVID-19 infection -presented with 10-day history of body aches and cough, tested positive in the ED. Has not been hypoxic. Chest x-ray showed bronchitic changes. --Completed remdesivir -- as needed albuterol -- Completed 5 day course doxycycline for secondary bronchitis  Acute pancreatitisdue to alcohol abuse, Present on admission, resolved -- Diet as tolerated  Hx of Hypertension Tachycardia Was likely due to EtOH withdrawal.  --cont Lopressor  Tobacco abuse -nicotine patch. Smoking cessation recommended.  Transaminitis-likely due to alcohol abuse. --RUQ ultrasound showed likely hepatic steatosis, gall stones.  CAD-chronic --stable without active chest pain or acute ischemic changes on EKG.  --cont ASA 81  A. fib, chronic - Has been normal sinus on tele, was tachycardic related to alcohol withdrawal, now rate controlled for most part.  --Not on anticoagulation or rate control meds --cont lopressor 25 mg BID   Overall improving. Came a long ways. Did very well with PT on 02/25/20 Pt will d/c to home with HHPT and RW. D/w [name] this morning and agrees with plan.  Son is recommende pt f/u with  PCP listed on Medicaid card CONSULTS OBTAINED:  DRUG ALLERGIES:  No Known Allergies  DISCHARGE MEDICATIONS:   Allergies as of 02/26/2020   No Known Allergies     Medication List    STOP taking these medications   potassium chloride 10 MEQ tablet Commonly known as: KLOR-CON Replaced by: potassium chloride 20 MEQ packet     TAKE these medications   acetaminophen 325 MG tablet Commonly known as: TYLENOL Take 2 tablets (650 mg total) by mouth every 6 (six) hours as needed for mild pain or fever.   albuterol 108 (90 Base) MCG/ACT inhaler Commonly known as: VENTOLIN HFA Inhale 2 puffs into the lungs every 4 (four) hours as needed for wheezing or shortness of breath.   aspirin 81 MG EC tablet Take 1 tablet (81 mg total) by mouth daily.   magnesium oxide 400 (241.3 Mg) MG tablet Commonly known as: MAG-OX Take 1 tablet (400 mg total) by mouth 2 (two) times daily.   metoprolol tartrate 25 MG tablet Commonly known as: LOPRESSOR Take 1 tablet (25 mg total) by mouth 2 (two) times daily.   multivitamin with minerals Tabs tablet Take 1 tablet by mouth daily.   potassium chloride 20 MEQ packet Commonly known as: KLOR-CON Take 20 mEq by mouth daily. Replaces: potassium chloride 10 MEQ tablet            Durable Medical Equipment  (From admission, onward)         Start     Ordered   02/26/20 0752  DME Dan Humphreys  Once       Question Answer Comment  Desch: With 5 Inch Wheels   Patient needs a Gavilanes to treat with the following condition Weakness      02/26/20 0751          If you experience worsening of your admission symptoms, develop shortness of breath, life threatening emergency, suicidal or homicidal thoughts you must seek medical attention immediately by calling 911 or calling your MD immediately  if symptoms less severe.  You Must read complete instructions/literature along with all the possible adverse reactions/side effects for all the Medicines you take  and that have been prescribed to you. Take any new Medicines after you have completely understood and accept all the possible adverse reactions/side effects.   Please note  You were cared for by a hospitalist during your hospital stay. If you have any questions about your discharge medications or the care you received while you were in the hospital after you are discharged, you can call the unit and asked to speak with the hospitalist on call if the hospitalist that took care of you is not available. Once you are discharged, your primary care physician will handle any further medical issues. Please note that NO REFILLS for any discharge medications will be authorized once you are discharged, as it is imperative that you return to your primary care physician (or establish a relationship with a primary care physician if you do not have one) for your aftercare needs so that they can reassess your need for medications and monitor your lab values. Today   SUBJECTIVE    Doing well. Wants to take bath today No new issues per RN VITAL SIGNS:  Blood pressure 100/71, pulse 97, temperature 98.9 F (37.2 C), resp. rate 17, height 5\' 9"  (1.753 m), weight 73.5 kg, SpO2 96 %.  I/O:    Intake/Output Summary (Last 24 hours) at 02/26/2020 0752 Last data filed at 02/25/2020 2253 Gross per 24 hour  Intake 120 ml  Output 400 ml  Net -280 ml    PHYSICAL EXAMINATION:  GENERAL:  65 y.o.-year-old patient lying in the bed with no acute distress.  LUNGS: Normal breath sounds bilaterally, no wheezing, rales,rhonchi or crepitation. No use of accessory muscles of respiration.  CARDIOVASCULAR: S1, S2 normal. No murmurs, rubs, or gallops.  ABDOMEN: Soft, non-tender, non-distended. Bowel sounds present. No organomegaly or mass.  EXTREMITIES: No pedal edema, cyanosis, or clubbing.  NEUROLOGIC: gen weakness, no focal weakness PSYCHIATRIC: The patient is alert and awake SKIN: No obvious rash, lesion, or ulcer.   DATA  REVIEW:   CBC  Recent Labs  Lab 02/23/20 0520  WBC 9.4  HGB 13.0  HCT 40.1  PLT 345    Chemistries  Recent Labs  Lab 02/23/20 0520 02/26/20 0522  NA 137  --   K 4.0  --   CL 104  --   CO2 23  --   GLUCOSE 161*  --   BUN 14  --   CREATININE 0.58*  --   CALCIUM 9.2  --   MG 1.7 1.6*    Microbiology Results   No results found for this or any previous visit (from the past 240 hour(s)).  RADIOLOGY:  No results found.   CODE STATUS:     Code Status Orders  (From admission, onward)         Start     Ordered   01/28/20 1203  Full code  Continuous        01/28/20 1202        Code Status History    Date Active Date Inactive Code Status Order ID Comments User Context   07/20/2019 0230 07/20/2019 1411 Full Code [id number]  [facility] Vernetta Honey, MD ED   11/11/2018 2355 11/25/2018 1856 Full Code [id number]  Houston Siren, MD Inpatient   Advance Care Planning Activity       TOTAL TIME TAKING CARE OF THIS PATIENT: 35 minutes.    Enedina Finner M.D  [facility]    CC: Primary care physician; Patient, [name]

## 2020-02-26 NOTE — TOC Transition Note (Signed)
Transition of Care [facility]) - CM/SW Discharge Note   Patient Details  Name: Thomas Coffey MRN: [id number] Date of Birth: 1955/12/13  [facility] CM/SW Contact:  Thomas Founds, RN Phone Number: 02/26/2020, 9:21 AM   Clinical Narrative:   RNCM reached out to patient's insurance co [employer] and was able to get a list of primary care providers as patient reported he was not aware if he had one. Placed call to [facility] and in the process of making patient and new patient appointment learned that patient actually has already set up care with Thomas Coffey. Appointment was made for next Thursday with Dr. Tonia Coffey and this was placed on discharge instructions. Patient will leave this afternoon, his son Thomas Coffey will pick him up and he will go to his home. At this time still trying to find a home health agency for PT.       Barriers to Discharge: Continued Medical Work up   Patient Goals and CMS Choice Patient states their goals for this hospitalization and ongoing recovery are:: to return home CMS Medicare.gov Compare Post Acute Care list provided to:: Patient Represenative (must comment) Choice offered to / list presented to : Adult Children  Discharge Placement                       Discharge Plan and Services                                     Social Determinants of Health (SDOH) Interventions     Readmission Risk Interventions No flowsheet data found.

## 2020-05-30 ENCOUNTER — Emergency Department: Payer: Medicaid Other

## 2020-05-30 ENCOUNTER — Emergency Department
Admission: EM | Admit: 2020-05-30 | Discharge: 2020-05-30 | Disposition: A | Discharge status: Home / Self Care | Payer: Medicaid Other | Attending: Emergency Medicine | Admitting: Emergency Medicine

## 2020-05-30 ENCOUNTER — Other Ambulatory Visit: Payer: Self-pay

## 2020-05-30 ENCOUNTER — Encounter: Payer: Self-pay | Admitting: Emergency Medicine

## 2020-05-30 DIAGNOSIS — Z79899 Other long term (current) drug therapy: Secondary | ICD-10-CM | POA: Insufficient documentation

## 2020-05-30 DIAGNOSIS — M5410 Radiculopathy, site unspecified: Secondary | ICD-10-CM

## 2020-05-30 DIAGNOSIS — Z7982 Long term (current) use of aspirin: Secondary | ICD-10-CM | POA: Diagnosis not present

## 2020-05-30 DIAGNOSIS — M5416 Radiculopathy, lumbar region: Secondary | ICD-10-CM | POA: Insufficient documentation

## 2020-05-30 DIAGNOSIS — Z8616 Personal history of COVID-19: Secondary | ICD-10-CM | POA: Insufficient documentation

## 2020-05-30 DIAGNOSIS — M545 Low back pain, unspecified: Secondary | ICD-10-CM | POA: Diagnosis present

## 2020-05-30 DIAGNOSIS — F17210 Nicotine dependence, cigarettes, uncomplicated: Secondary | ICD-10-CM | POA: Insufficient documentation

## 2020-05-30 DIAGNOSIS — I2510 Atherosclerotic heart disease of native coronary artery without angina pectoris: Secondary | ICD-10-CM | POA: Diagnosis not present

## 2020-05-30 DIAGNOSIS — M4716 Other spondylosis with myelopathy, lumbar region: Secondary | ICD-10-CM | POA: Diagnosis not present

## 2020-05-30 MED ORDER — DEXAMETHASONE SODIUM PHOSPHATE 10 MG/ML IJ SOLN
10.0000 mg | Freq: Once | INTRAMUSCULAR | Status: AC
  Administered 2020-05-30: 10 mg via INTRAMUSCULAR
  Filled 2020-05-30: qty 1

## 2020-05-30 MED ORDER — HYDROMORPHONE HCL 1 MG/ML IJ SOLN
1.0000 mg | Freq: Once | INTRAMUSCULAR | Status: AC
  Administered 2020-05-30: 1 mg via INTRAMUSCULAR
  Filled 2020-05-30: qty 1

## 2020-05-30 MED ORDER — OXYCODONE-ACETAMINOPHEN 5-325 MG PO TABS: 1.0000 | ORAL_TABLET | ORAL | 0 refills | Status: DC | PRN

## 2020-05-30 MED ORDER — METHOCARBAMOL 500 MG PO TABS: 500.0000 mg | ORAL_TABLET | Freq: Four times a day (QID) | ORAL | 0 refills | Status: DC | PRN

## 2020-05-30 MED ORDER — ORPHENADRINE CITRATE 30 MG/ML IJ SOLN
60.0000 mg | Freq: Two times a day (BID) | INTRAMUSCULAR | Status: DC
  Administered 2020-05-30: 60 mg via INTRAMUSCULAR
  Filled 2020-05-30: qty 2

## 2020-05-30 MED ORDER — METHYLPREDNISOLONE 4 MG PO TBPK: ORAL_TABLET | ORAL | 0 refills | Status: DC

## 2020-05-30 NOTE — Discharge Instructions (Signed)
Read and follow discharge care instructions follow-up orthopedic for definitive evaluation and treatment.

## 2020-05-30 NOTE — ED Triage Notes (Signed)
Presents with right leg pain for 3 weeks  States pain radiates from hip into foot  Was seen by PCP and placed on steroids but has not f/u'd   States pain has increased

## 2020-05-30 NOTE — ED Provider Notes (Signed)
[facility] Emergency Department Provider Note   ____________________________________________   Event Date/Time   First MD Initiated Contact with Patient 05/30/20 1044     (approximate)  I have reviewed the triage vital signs and the nursing notes.   HISTORY  Chief Complaint No chief complaint on file.    HPI Thomas Coffey is a 65 y.o. male patient presents with radicular pain to the right lower extremity for 3 weeks.  Patient stated pain radiates from the back to the hip and down to the top of his foot.  Patient was seen by PCP last week and placed on steroids and state he felt better until the medicine wore off.  Denies bladder or bowel dysfunction.  Describes pain as "shooting" down his leg.         Past Medical History:  Diagnosis Date  . MI (myocardial infarction) [facility])     Patient Active Problem List   Diagnosis Date Noted  . Acute metabolic encephalopathy   . Failure to thrive in adult 02/13/2020  . COVID-19 01/29/2020  . COVID-19 virus infection 01/28/2020  . Abnormal LFTs 01/28/2020  . CAD (coronary artery disease) 01/28/2020  . Atrial fibrillation, chronic (HCC) 01/28/2020  . Acute respiratory disease due to COVID-19 virus 01/28/2020  . Tobacco abuse 01/28/2020  . Acute pancreatitis 07/20/2019  . Hypomagnesemia 11/24/2018  . Protein-calorie malnutrition, severe 11/23/2018  . Pleural effusion on left   . Liver function test abnormality   . AKI (acute kidney injury) (HCC)   . Atrial fibrillation with rapid ventricular response (HCC)   . Acute hepatitis 11/17/2018  . Atrial fibrillation with RVR (HCC) 11/17/2018  . Alcohol abuse 11/17/2018  . Tobacco dependence syndrome 11/17/2018  . Empyema [facility])     History reviewed. No pertinent surgical history.  Prior to Admission medications   Medication Sig Start Date End Date Taking? Authorizing Provider  methocarbamol (ROBAXIN) 500 MG tablet Take 1 tablet (500 mg total) by mouth  every 6 (six) hours as needed for muscle spasms. 05/30/20  Yes Joni Reining, PA-C  methylPREDNISolone (MEDROL DOSEPAK) 4 MG TBPK tablet Take Tapered dose as directed 05/30/20  Yes Joni Reining, PA-C  oxyCODONE-acetaminophen (PERCOCET) 5-325 MG tablet Take 1 tablet by mouth every 4 (four) hours as needed for up to 3 days for severe pain. 05/30/20 06/02/20 Yes Joni Reining, PA-C  acetaminophen (TYLENOL) 325 MG tablet Take 2 tablets (650 mg total) by mouth every 6 (six) hours as needed for mild pain or fever. 11/25/18   Lorretta Harp, MD  albuterol (VENTOLIN HFA) 108 (90 Base) MCG/ACT inhaler Inhale 2 puffs into the lungs every 4 (four) hours as needed for wheezing or shortness of breath. 01/22/20   Dionne Bucy, MD  aspirin EC 81 MG EC tablet Take 1 tablet (81 mg total) by mouth daily. 11/26/18   Lorretta Harp, MD  magnesium oxide (MAG-OX) 400 (241.3 Mg) MG tablet Take 1 tablet (400 mg total) by mouth 2 (two) times daily. 02/26/20   Enedina Finner, MD  metoprolol tartrate (LOPRESSOR) 25 MG tablet Take 1 tablet (25 mg total) by mouth 2 (two) times daily. 02/26/20   Enedina Finner, MD  Multiple Vitamin (MULTIVITAMIN WITH MINERALS) TABS tablet Take 1 tablet by mouth daily. 02/26/20   Enedina Finner, MD  potassium chloride (KLOR-CON) 20 MEQ packet Take 20 mEq by mouth daily. 02/26/20   Enedina Finner, MD    Allergies Patient has no known allergies.  Family History  Problem  Relation Age of Onset  . Cancer Mother   . Kidney disease Brother     Social History Social History   Tobacco Use  . Smoking status: Current Every Day Smoker    Packs/day: 1.00    Years: 30.00    Pack years: 30.00    Types: Cigarettes  . Smokeless tobacco: Never Used  Substance Use Topics  . Alcohol use: Yes    Alcohol/week: 6.0 standard drinks    Types: 6 Cans of beer per week    Comment: daily  . Drug use: Yes    Types: Marijuana    Review of Systems Constitutional: No fever/chills Eyes: No visual changes. ENT: No sore  throat. Cardiovascular: Denies chest pain.  History of A. fib. Respiratory: Denies shortness of breath. Gastrointestinal: No abdominal pain.  No nausea, no vomiting.  No diarrhea.  No constipation. Genitourinary: Negative for dysuria. Musculoskeletal: Negative for back pain. Skin: Negative for rash. Neurological: Negative for headaches, focal weakness or numbness. Psychiatric:  EtOH abuse. Endocrine:  Hepatitis  ____________________________________________   PHYSICAL EXAM:  VITAL SIGNS: ED Triage Vitals  Enc Vitals Group     BP 05/30/20 1029 (!) 118/94     Pulse Rate 05/30/20 1029 (!) 116     Resp 05/30/20 1029 20     Temp 05/30/20 1029 97.9 F (36.6 C)     Temp Source 05/30/20 1029 Oral     SpO2 05/30/20 1029 99 %     Weight 05/30/20 1032 162 lb 0.6 oz (73.5 kg)     Height 05/30/20 1032 5\' 9"  (1.753 m)     Head Circumference --      Peak Flow --      Pain Score --      Pain Loc --      Pain Edu? --      Excl. in GC? --     Constitutional: Alert and oriented. Well appearing and in no acute distress. Eyes: Conjunctivae are normal. PERRL. EOMI. Head: Atraumatic. Nose: No congestion/rhinnorhea. Mouth/Throat: Mucous membranes are moist.  Oropharynx non-erythematous. Neck: No stridor.  No cervical spine tenderness to palpation. Hematological/Lymphatic/Immunilogical: No cervical lymphadenopathy. Cardiovascular: Tachycardic, regular rhythm. Grossly normal heart sounds.  Good peripheral circulation. Respiratory: Normal respiratory effort.  No retractions. Lungs CTAB. Gastrointestinal: Soft and nontender. No distention. No abdominal bruits. No CVA tenderness. Genitourinary: Deferred Musculoskeletal: No lower extremity tenderness nor edema.  No joint effusions. Neurologic:  Normal speech and language. No gross focal neurologic deficits are appreciated. No gait instability. Skin:  Skin is warm, dry and intact. No rash noted. Psychiatric: Mood and affect are normal. Speech and  behavior are normal.  ____________________________________________   LABS (all labs ordered are listed, but only abnormal results are displayed)  Labs Reviewed - No data to display ____________________________________________  EKG  ____________________________________________  RADIOLOGY I, , personally viewed and evaluated these images (plain radiographs) as part of my medical decision making, as well as reviewing the written report by the radiologist.  ED MD interpretation: Moderate degenerative changes of the lumbar spine and right hip.  Official radiology report(s): DG Lumbar Spine 2-3 Views  Result Date: 05/30/2020 CLINICAL DATA:  Radicular pain to RIGHT LOWER extremity. EXAM: LUMBAR SPINE - 2-3 VIEW COMPARISON:  07/18/2005 FINDINGS: Normal alignment. Moderate degenerative changes throughout the lumbar spine. Moderate facet hypertrophy in the LOWER lumbar levels. There is no acute fracture or subluxation. No lytic or blastic lesions. IMPRESSION: Moderate degenerative changes.  No evidence for acute  abnormality.  Electronically Signed   By: Norva Pavlov M.D.   On: 05/30/2020 11:36   DG Hip Unilat W or Wo Pelvis 2-3 Views Right  Result Date: 05/30/2020 CLINICAL DATA:  Radicular pain to the RIGHT LOWER extremity. Symptoms for 3 weeks. EXAM: DG HIP (WITH OR WITHOUT PELVIS) 2-3V RIGHT COMPARISON:  None. FINDINGS: There is no acute fracture or subluxation. Degenerative changes are noted in the lumbar spine. IMPRESSION: No evidence for acute  abnormality. Electronically Signed   By: Norva Pavlov M.D.   On: 05/30/2020 11:39    ____________________________________________   PROCEDURES  Procedure(s) performed (including Critical Care):  Procedures   ____________________________________________   INITIAL IMPRESSION / ASSESSMENT AND PLAN / ED COURSE  As part of my medical decision making, I reviewed the following data within the electronic MEDICAL RECORD NUMBER          Patient presents with radicular pain to the right lower extremity for 3 weeks.  Discussed x-ray findings with patient.  Patient given discharge care instruction advised take medication as directed.  Patient consulted orthopedic for definitive valuation treatment.      ____________________________________________   FINAL CLINICAL IMPRESSION(S) / ED DIAGNOSES  Final diagnoses:  Radicular low back pain  Osteoarthritis of lumbar spine with myelopathy     ED Discharge Orders         Ordered    methylPREDNISolone (MEDROL DOSEPAK) 4 MG TBPK tablet        05/30/20 1236    methocarbamol (ROBAXIN) 500 MG tablet  Every 6 hours PRN        05/30/20 1236    oxyCODONE-acetaminophen (PERCOCET) 5-325 MG tablet  Every 4 hours PRN        05/30/20 1236          *Please note:  Thomas Coffey was evaluated in Emergency Department on 05/30/2020 for the symptoms described in the history of present illness. He was evaluated in the context of the global COVID-19 pandemic, which necessitated consideration that the patient might be at risk for infection with the SARS-CoV-2 virus that causes COVID-19. Institutional protocols and algorithms that pertain to the evaluation of patients at risk for COVID-19 are in a state of rapid change based on information released by regulatory bodies including the CDC and federal and state organizations. These policies and algorithms were followed during the patient's care in the ED.  Some ED evaluations and interventions may be delayed as a result of limited staffing during and the pandemic.*   Note:  This document was prepared using Dragon voice recognition software and may include unintentional dictation errors.    Joni Reining, PA-C 05/30/20 1242    Gilles Chiquito, MD 05/30/20 1626

## 2020-06-01 ENCOUNTER — Other Ambulatory Visit: Payer: Self-pay

## 2020-06-01 ENCOUNTER — Emergency Department: Payer: Medicaid Other

## 2020-06-01 ENCOUNTER — Inpatient Hospital Stay
Admission: EM | Admit: 2020-06-01 | Discharge: 2020-06-03 | DRG: 443 | Disposition: A | Discharge status: Home / Self Care | Payer: Medicaid Other | Attending: Internal Medicine | Admitting: Internal Medicine

## 2020-06-01 DIAGNOSIS — Z79899 Other long term (current) drug therapy: Secondary | ICD-10-CM | POA: Diagnosis not present

## 2020-06-01 DIAGNOSIS — Z20822 Contact with and (suspected) exposure to covid-19: Secondary | ICD-10-CM | POA: Diagnosis present

## 2020-06-01 DIAGNOSIS — M5136 Other intervertebral disc degeneration, lumbar region: Secondary | ICD-10-CM | POA: Diagnosis present

## 2020-06-01 DIAGNOSIS — Z841 Family history of disorders of kidney and ureter: Secondary | ICD-10-CM

## 2020-06-01 DIAGNOSIS — D72829 Elevated white blood cell count, unspecified: Secondary | ICD-10-CM | POA: Diagnosis not present

## 2020-06-01 DIAGNOSIS — I2510 Atherosclerotic heart disease of native coronary artery without angina pectoris: Secondary | ICD-10-CM | POA: Diagnosis present

## 2020-06-01 DIAGNOSIS — Z809 Family history of malignant neoplasm, unspecified: Secondary | ICD-10-CM

## 2020-06-01 DIAGNOSIS — I81 Portal vein thrombosis: Secondary | ICD-10-CM | POA: Diagnosis present

## 2020-06-01 DIAGNOSIS — F17210 Nicotine dependence, cigarettes, uncomplicated: Secondary | ICD-10-CM | POA: Diagnosis present

## 2020-06-01 DIAGNOSIS — R7401 Elevation of levels of liver transaminase levels: Secondary | ICD-10-CM | POA: Diagnosis present

## 2020-06-01 DIAGNOSIS — Z716 Tobacco abuse counseling: Secondary | ICD-10-CM

## 2020-06-01 DIAGNOSIS — R1012 Left upper quadrant pain: Secondary | ICD-10-CM

## 2020-06-01 DIAGNOSIS — R1084 Generalized abdominal pain: Secondary | ICD-10-CM

## 2020-06-01 DIAGNOSIS — F17200 Nicotine dependence, unspecified, uncomplicated: Secondary | ICD-10-CM | POA: Diagnosis not present

## 2020-06-01 DIAGNOSIS — Z7141 Alcohol abuse counseling and surveillance of alcoholic: Secondary | ICD-10-CM

## 2020-06-01 DIAGNOSIS — F1010 Alcohol abuse, uncomplicated: Secondary | ICD-10-CM | POA: Diagnosis present

## 2020-06-01 DIAGNOSIS — M5116 Intervertebral disc disorders with radiculopathy, lumbar region: Secondary | ICD-10-CM | POA: Diagnosis present

## 2020-06-01 DIAGNOSIS — R109 Unspecified abdominal pain: Secondary | ICD-10-CM | POA: Diagnosis present

## 2020-06-01 DIAGNOSIS — Z8616 Personal history of COVID-19: Secondary | ICD-10-CM

## 2020-06-01 DIAGNOSIS — I480 Paroxysmal atrial fibrillation: Secondary | ICD-10-CM | POA: Diagnosis present

## 2020-06-01 DIAGNOSIS — I252 Old myocardial infarction: Secondary | ICD-10-CM

## 2020-06-01 HISTORY — DX: Paroxysmal atrial fibrillation: I48.0

## 2020-06-01 HISTORY — DX: Acute pancreatitis without necrosis or infection, unspecified: K85.90

## 2020-06-01 HISTORY — DX: COVID-19: U07.1

## 2020-06-01 LAB — COMPREHENSIVE METABOLIC PANEL
ALT: 56 U/L — ABNORMAL HIGH (ref 0–44)
AST: 83 U/L — ABNORMAL HIGH (ref 15–41)
Albumin: 3.6 g/dL (ref 3.5–5.0)
Alkaline Phosphatase: 160 U/L — ABNORMAL HIGH (ref 38–126)
Anion gap: 9 (ref 5–15)
BUN: 12 mg/dL (ref 8–23)
CO2: 24 mmol/L (ref 22–32)
Calcium: 9.6 mg/dL (ref 8.9–10.3)
Chloride: 103 mmol/L (ref 98–111)
Creatinine, Ser: 0.8 mg/dL (ref 0.61–1.24)
GFR, Estimated: >60 mL/min (ref >60)
Glucose, Bld: 108 mg/dL — ABNORMAL HIGH (ref 70–99)
Potassium: 3.8 mmol/L (ref 3.5–5.1)
Sodium: 136 mmol/L (ref 135–145)
Total Bilirubin: 1 mg/dL (ref 0.3–1.2)
Total Protein: 7.8 g/dL (ref 6.5–8.1)

## 2020-06-01 LAB — CBC WITH DIFFERENTIAL/PLATELET
Abs Immature Granulocytes: 0.09 10*3/uL — ABNORMAL HIGH (ref 0.00–0.07)
Basophils Absolute: 0 10*3/uL (ref 0.0–0.1)
Basophils Relative: 0 %
Eosinophils Absolute: 0 10*3/uL (ref 0.0–0.5)
Eosinophils Relative: 0 %
HCT: 46.9 % (ref 39.0–52.0)
Hemoglobin: 15.6 g/dL (ref 13.0–17.0)
Immature Granulocytes: 1 %
Lymphocytes Relative: 9 %
Lymphs Abs: 1.4 10*3/uL (ref 0.7–4.0)
MCH: 29.8 pg (ref 26.0–34.0)
MCHC: 33.3 g/dL (ref 30.0–36.0)
MCV: 89.7 fL (ref 80.0–100.0)
Monocytes Absolute: 1.4 10*3/uL — ABNORMAL HIGH (ref 0.1–1.0)
Monocytes Relative: 9 %
Neutro Abs: 12.2 10*3/uL — ABNORMAL HIGH (ref 1.7–7.7)
Neutrophils Relative %: 81 %
Platelets: 244 10*3/uL (ref 150–400)
RBC: 5.23 MIL/uL (ref 4.22–5.81)
RDW: 15.4 % (ref 11.5–15.5)
WBC: 15.2 10*3/uL — ABNORMAL HIGH (ref 4.0–10.5)
nRBC: 0 % (ref 0.0–0.2)

## 2020-06-01 LAB — PROTIME-INR
INR: 1.1 (ref 0.8–1.2)
Prothrombin Time: 14.5 seconds (ref 11.4–15.2)

## 2020-06-01 LAB — LIPASE, BLOOD: Lipase: 58 U/L — ABNORMAL HIGH (ref 11–51)

## 2020-06-01 LAB — APTT: aPTT: 31 seconds (ref 24–36)

## 2020-06-01 LAB — RESP PANEL BY RT-PCR (FLU A&B, COVID) ARPGX2
Influenza A by PCR: NEGATIVE
Influenza B by PCR: NEGATIVE
SARS Coronavirus 2 by RT PCR: NEGATIVE

## 2020-06-01 MED ORDER — ACETAMINOPHEN 650 MG RE SUPP: 650.0000 mg | Freq: Four times a day (QID) | RECTAL | Status: DC | PRN

## 2020-06-01 MED ORDER — HYDROMORPHONE HCL 1 MG/ML IJ SOLN
1.0000 mg | Freq: Once | INTRAMUSCULAR | Status: AC
  Administered 2020-06-01: 1 mg via INTRAVENOUS
  Filled 2020-06-01: qty 1

## 2020-06-01 MED ORDER — HEPARIN BOLUS VIA INFUSION
5000.0000 [IU] | Freq: Once | INTRAVENOUS | Status: AC
  Administered 2020-06-01: 5000 [IU] via INTRAVENOUS
  Filled 2020-06-01: qty 5000

## 2020-06-01 MED ORDER — MORPHINE SULFATE (PF) 4 MG/ML IV SOLN
4.0000 mg | Freq: Once | INTRAVENOUS | Status: AC
  Administered 2020-06-01: 4 mg via INTRAVENOUS
  Filled 2020-06-01: qty 1

## 2020-06-01 MED ORDER — NICOTINE 14 MG/24HR TD PT24
14.0000 mg | MEDICATED_PATCH | Freq: Once | TRANSDERMAL | Status: AC
  Administered 2020-06-01: 14 mg via TRANSDERMAL
  Filled 2020-06-01: qty 1

## 2020-06-01 MED ORDER — MORPHINE SULFATE (PF) 4 MG/ML IV SOLN
4.0000 mg | INTRAVENOUS | Status: DC | PRN
  Administered 2020-06-01 – 2020-06-02 (×4): 4 mg via INTRAVENOUS
  Filled 2020-06-01 (×4): qty 1

## 2020-06-01 MED ORDER — NALOXONE HCL 0.4 MG/ML IJ SOLN: 0.4000 mg | INTRAMUSCULAR | Status: DC | PRN

## 2020-06-01 MED ORDER — HEPARIN (PORCINE) 25000 UT/250ML-% IV SOLN
1550.0000 [IU]/h | INTRAVENOUS | Status: DC
  Administered 2020-06-01: 1250 [IU]/h via INTRAVENOUS
  Administered 2020-06-02: 1450 [IU]/h via INTRAVENOUS
  Administered 2020-06-03: 1550 [IU]/h via INTRAVENOUS
  Filled 2020-06-01 (×3): qty 250

## 2020-06-01 MED ORDER — ONDANSETRON HCL 4 MG/2ML IJ SOLN
4.0000 mg | Freq: Four times a day (QID) | INTRAMUSCULAR | Status: DC | PRN
  Administered 2020-06-02: 4 mg via INTRAVENOUS
  Filled 2020-06-01: qty 2

## 2020-06-01 MED ORDER — ONDANSETRON HCL 4 MG/2ML IJ SOLN
4.0000 mg | Freq: Once | INTRAMUSCULAR | Status: AC
  Administered 2020-06-01: 4 mg via INTRAVENOUS
  Filled 2020-06-01: qty 2

## 2020-06-01 MED ORDER — ACETAMINOPHEN 325 MG PO TABS
650.0000 mg | ORAL_TABLET | Freq: Four times a day (QID) | ORAL | Status: DC | PRN
  Administered 2020-06-03: 650 mg via ORAL
  Filled 2020-06-01: qty 2

## 2020-06-01 NOTE — Progress Notes (Signed)
Brief note regarding plan, with full H&P to follow:  65 year old male who is admitted with nonocclusive portal vein thrombus after presenting from home to [facility] ED for generalized abdominal discomfort, that was preceded by right lower back pain.  Imaging reveals nonocclusive portal vein thrombus.  Not on any blood thinners at home. started on heparin drip via pharmacy consultation.  Monitoring coags. as needed IV morphine, as needed IV Zofran.    Thomas Pigg, DO Hospitalist

## 2020-06-01 NOTE — ED Notes (Signed)
See triage note  Presents with cont'd lower back pain  But is to see his pain MD tomorrow    But states today his stomach hurts

## 2020-06-01 NOTE — ED Triage Notes (Signed)
Pt comes with c/o lower back pain and body aches all over. Pt states this started several weeks ago. Pt states pain in left leg also.

## 2020-06-01 NOTE — ED Notes (Signed)
U/s finished   States he is a lot of pain  Provider aware

## 2020-06-01 NOTE — ED Triage Notes (Signed)
Pt comes into the ED via EMS from home with c/o BL LE pain and has an appt tomorrow for the back pain.

## 2020-06-01 NOTE — Consult Note (Signed)
ANTICOAGULATION CONSULT NOTE  Pharmacy Consult for IV Heparin Indication: portal vein thrombosis  Patient Measurements: Heparin Dosing Weight: 73.5 kg  Labs: Recent Labs    06/01/20 1319  HGB 15.6  HCT 46.9  PLT 244  CREATININE 0.80    Estimated Creatinine Clearance: 93.3 mL/min (by C-G formula based on SCr of 0.8 mg/dL).   Medical History: Past Medical History:  Diagnosis Date  . MI (myocardial infarction) (HCC)     Medications:  No anticoagulation prior to admission per my chart review. Medication reconciliation is pending  Assessment: Patient is a 65 y/o M with medical history as above who presented to the ED 5/17 with back pain. Ultrasound revealed portal vein thrombosis. Pharmacy has been consulted to initiate heparin infusion for DVT treatment.  Baseline CBC within normal limits. Baseline aPTT and PT-INR pending.  Goal of Therapy:  Heparin level 0.3-0.7 units/ml Monitor platelets by anticoagulation protocol: Yes   Plan:  --Heparin 5000 unit IV bolus x 1 followed by continuous infusion at 1250 units/hr --Heparin level 6 hours after initiation of infusion --Daily CBC per protocol while on IV heparin  [name] 06/01/2020,6:44 PM

## 2020-06-01 NOTE — ED Provider Notes (Signed)
[facility] Emergency Department Provider Note   ____________________________________________   Event Date/Time   First MD Initiated Contact with Patient 06/01/20 1256     (approximate)  I have reviewed the triage vital signs and the nursing notes.   HISTORY  Chief Complaint Back Pain    HPI Thomas Coffey is a 65 y.o. male patient returns back to ED after emergency room visit on 05/30/2020.  Patient was seen for back pain and diagnosed with chronic degenerative change in the lumbar spine with radiculopathy.  Patient is scheduled to see orthopedics tomorrow but came in today by EMS.  Patient states he is experiencing body aches and abdominal pain.  Patient state his back complaint is still present.  Patient states gets a burning sensation in his stomach when he drinks on his juice.  Patient also complaining of myalgia and headache.  Denies vomiting or diarrhea.  Patient was concerned with for abdominal pain which is similar to his episode of acute pancreatitis a few years ago.  Rates his pain as a 10/10.  Described pain as "achy".       Past Medical History:  Diagnosis Date  . MI (myocardial infarction) [facility])     Patient Active Problem List   Diagnosis Date Noted  . Acute metabolic encephalopathy   . Failure to thrive in adult 02/13/2020  . COVID-19 01/29/2020  . COVID-19 virus infection 01/28/2020  . Abnormal LFTs 01/28/2020  . CAD (coronary artery disease) 01/28/2020  . Atrial fibrillation, chronic (HCC) 01/28/2020  . Acute respiratory disease due to COVID-19 virus 01/28/2020  . Tobacco abuse 01/28/2020  . Acute pancreatitis 07/20/2019  . Hypomagnesemia 11/24/2018  . Protein-calorie malnutrition, severe 11/23/2018  . Pleural effusion on left   . Liver function test abnormality   . AKI (acute kidney injury) (HCC)   . Atrial fibrillation with rapid ventricular response (HCC)   . Acute hepatitis 11/17/2018  . Atrial fibrillation with RVR  (HCC) 11/17/2018  . Alcohol abuse 11/17/2018  . Tobacco dependence syndrome 11/17/2018  . Empyema [facility])     History reviewed. No pertinent surgical history.  Prior to Admission medications   Medication Sig Start Date End Date Taking? Authorizing Provider  acetaminophen (TYLENOL) 325 MG tablet Take 2 tablets (650 mg total) by mouth every 6 (six) hours as needed for mild pain or fever. 11/25/18   Lorretta Harp, MD  albuterol (VENTOLIN HFA) 108 (90 Base) MCG/ACT inhaler Inhale 2 puffs into the lungs every 4 (four) hours as needed for wheezing or shortness of breath. 01/22/20   Dionne Bucy, MD  aspirin EC 81 MG EC tablet Take 1 tablet (81 mg total) by mouth daily. 11/26/18   Lorretta Harp, MD  magnesium oxide (MAG-OX) 400 (241.3 Mg) MG tablet Take 1 tablet (400 mg total) by mouth 2 (two) times daily. 02/26/20   Enedina Finner, MD  methocarbamol (ROBAXIN) 500 MG tablet Take 1 tablet (500 mg total) by mouth every 6 (six) hours as needed for muscle spasms. 05/30/20   Joni Reining, PA-C  methylPREDNISolone (MEDROL DOSEPAK) 4 MG TBPK tablet Take Tapered dose as directed 05/30/20   Joni Reining, PA-C  metoprolol tartrate (LOPRESSOR) 25 MG tablet Take 1 tablet (25 mg total) by mouth 2 (two) times daily. 02/26/20   Enedina Finner, MD  Multiple Vitamin (MULTIVITAMIN WITH MINERALS) TABS tablet Take 1 tablet by mouth daily. 02/26/20   Enedina Finner, MD  oxyCODONE-acetaminophen (PERCOCET) 5-325 MG tablet Take 1 tablet by mouth every  4 (four) hours as needed for up to 3 days for severe pain. 05/30/20 06/02/20  Joni Reining, PA-C  potassium chloride (KLOR-CON) 20 MEQ packet Take 20 mEq by mouth daily. 02/26/20   Enedina Finner, MD    Allergies Patient has no known allergies.  Family History  Problem Relation Age of Onset  . Cancer Mother   . Kidney disease Brother     Social History Social History   Tobacco Use  . Smoking status: Current Every Day Smoker    Packs/day: 1.00    Years: 30.00    Pack years:  30.00    Types: Cigarettes  . Smokeless tobacco: Never Used  Substance Use Topics  . Alcohol use: Yes    Alcohol/week: 6.0 standard drinks    Types: 6 Cans of beer per week    Comment: daily  . Drug use: Yes    Types: Marijuana    Review of Systems Constitutional: No fever/chills Eyes: No visual changes. ENT: No sore throat. Cardiovascular: Denies chest pain. Respiratory: Denies shortness of breath. Gastrointestinal: Mid abdominal pain.  No nausea, no vomiting.  No diarrhea.  No constipation. Genitourinary: Negative for dysuria. Musculoskeletal: Negative for back pain. Skin: Negative for rash. Neurological: Negative for headaches, focal weakness or numbness. ____________________________________________   PHYSICAL EXAM:  VITAL SIGNS: ED Triage Vitals  Enc Vitals Group     BP 06/01/20 1255 (!) 162/96     Pulse Rate 06/01/20 1255 85     Resp 06/01/20 1255 18     Temp 06/01/20 1255 98.7 F (37.1 C)     Temp src --      SpO2 06/01/20 1255 99 %     Weight 06/01/20 1257 162 lb 0.6 oz (73.5 kg)     Height 06/01/20 1257 5\' 9"  (1.753 m)     Head Circumference --      Peak Flow --      Pain Score 06/01/20 1254 10     Pain Loc --      Pain Edu? --      Excl. in GC? --     Constitutional: Alert and oriented. Well appearing and in no acute distress. Eyes: Conjunctivae are normal. PERRL. EOMI. Head: Atraumatic. Nose: No congestion/rhinnorhea. Mouth/Throat: Mucous membranes are moist.  Oropharynx non-erythematous. Neck: No stridor.   Hematological/Lymphatic/Immunilogical: No cervical lymphadenopathy. *ardiovascular: Normal rate, regular rhythm. Grossly normal heart sounds.  Good peripheral circulation. Respiratory: Normal respiratory effort.  No retractions. Lungs CTAB. Gastrointestinal: Soft and and guarding with mid abdomen.. No distention. No abdominal bruits. No CVA tenderness. Musculoskeletal: No lower extremity tenderness nor edema.  No joint effusions. Neurologic:   Normal speech and language. No gross focal neurologic deficits are appreciated. No gait instability. Skin:  Skin is warm, dry and intact. No rash noted. Psychiatric: Mood and affect are normal. Speech and behavior are normal.  ____________________________________________   LABS (all labs ordered are listed, but only abnormal results are displayed)  Labs Reviewed  LIPASE, BLOOD - Abnormal; Notable for the following components:      Result Value   Lipase 58 (*)    All other components within normal limits  COMPREHENSIVE METABOLIC PANEL - Abnormal; Notable for the following components:   Glucose, Bld 108 (*)    AST 83 (*)    ALT 56 (*)    Alkaline Phosphatase 160 (*)    All other components within normal limits  CBC WITH DIFFERENTIAL/PLATELET - Abnormal; Notable for the following components:   WBC  15.2 (*)    Neutro Abs 12.2 (*)    Monocytes Absolute 1.4 (*)    Abs Immature Granulocytes 0.09 (*)    All other components within normal limits   ____________________________________________  EKG   ____________________________________________  RADIOLOGY I, Joni Reining, personally viewed and evaluated these images (plain radiographs) as part of my medical decision making, as well as reviewing the written report by the radiologist.  ED MD interpretation:    Official radiology report(s): No results found.  ____________________________________________   PROCEDURES  Procedure(s) performed (including Critical Care):  Procedures   ____________________________________________   INITIAL IMPRESSION / ASSESSMENT AND PLAN / ED COURSE  As part of my medical decision making, I reviewed the following data within the electronic MEDICAL RECORD NUMBER         Patient presents with mid and upper abdominal pain for aute onset today.  Differential consist of pancreatitis and cholecystitis.  Further evaluation is warranted with abdominal ultrasound.  Due to shift change patient will be  followed up by PA Aundria Rud      ____________________________________________   FINAL CLINICAL IMPRESSION(S) / ED DIAGNOSES  Final diagnoses:  Left upper quadrant abdominal pain     ED Discharge Orders    None      *Please note:  Thomas Coffey was evaluated in Emergency Department on 06/01/2020 for the symptoms described in the history of present illness. He was evaluated in the context of the global COVID-19 pandemic, which necessitated consideration that the patient might be at risk for infection with the SARS-CoV-2 virus that causes COVID-19. Institutional protocols and algorithms that pertain to the evaluation of patients at risk for COVID-19 are in a state of rapid change based on information released by regulatory bodies including the CDC and federal and state organizations. These policies and algorithms were followed during the patient's care in the ED.  Some ED evaluations and interventions may be delayed as a result of limited staffing during and the pandemic.*   Note:  This document was prepared using Dragon voice recognition software and may include unintentional dictation errors.    Joni Reining, PA-C 06/01/20 1628    Minna Antis, MD 06/02/20 [id number]

## 2020-06-01 NOTE — ED Provider Notes (Signed)
  Physical Exam  BP (!) 160/90 (BP Location: Right Arm)   Pulse 78   Temp 98.7 F (37.1 C)   Resp 18   Ht 5\' 9"  (1.753 m)   Wt 73.5 kg   SpO2 99%   BMI 23.93 kg/m    ED Course/Procedures    Patient received and shift handoff from , PA-C.  In short, patient is a 65 year old male who presents with atraumatic back pain recently treated here on Sunday, who developed worsening of his pain wrapping into the generalized abdomen.  He has had difficulty controlling his pain at home but denies any other associated symptoms at this time.  Labs were obtained and demonstrate some very mild transaminitis as well as mild elevation of his lipase.  Current plan is await ultrasound results for determination of further work-up and/or disposition.  MDM  Received a call from radiology, notifying me that the patient has what appears to be a new nonocclusive portal vein thrombosis.  This was discussed with attending Dr. Sunday, who agrees with initiation of heparin per pharmacy consult with hospitalist admission.  Notify the patient of his results and he is understanding that he will need admission for treatment.  Case was discussed with the hospitalist, who agrees to accept the patient to their service for admission.  Patient stable this time for transfer to the floor.       Delton Prairie, PA 06/01/20 1931    06/03/20, MD 06/05/20 [phone]

## 2020-06-01 NOTE — H&P (Signed)
History and Physical    PLEASE NOTE THAT DRAGON DICTATION SOFTWARE WAS USED IN THE CONSTRUCTION OF THIS NOTE.   RENEL ENDE ZOX:[id number] DOB: Sep 20, 1955 DOA: 06/01/2020  PCP: Oswaldo Conroy, MD Patient coming from: home   I have personally briefly reviewed patient's old medical records in [facility]  Chief Complaint: Abdominal pain  HPI: Thomas Coffey is a 65 y.o. male with medical history significant for paroxysmal atrial fibrillation not chronically anticoagulated, coronary artery disease status post MI, chronic tobacco abuse, chronic alcohol abuse, acute pancreatitis, chronic transaminitis, who is admitted to [facility] on 06/01/2020 with nonocclusive portal vein thrombus after presenting from home to [facility] ED complaining of abdominal pain.   The patient reports that he has been experiencing approximately 2 weeks of low back pain, with radiation into the right lower extremity terminating at the level of the right foot.  He reports that this discomfort has been nearly constant over that timeframe, with exacerbation with ambulation.  Denies any preceding trauma.  He had presented to [facility] ED on 05/30/2020 for evaluation of this back pain, at which time he underwent plain films of the lumbar spine which showed moderate degenerative disease throughout the lumbar spine without evidence of associated acute fracture or subluxation.  Given the radiation of his low back pain to the right lower extremity, plain films of the right hip were also pursued, and showed no evidence of acute fracture or subluxation.  Follow-up in the outpatient orthopedic surgery clinic on 06/02/2020 was scheduled for the patient, and he was subsequently discharged home from the ED.   Following discharge from the ED on 05/30/2020, the patient reports persistence of his lower back discomfort, continuing to note radiation of this discomfort into the right foot, but over the last 1 to 2 days has  also noted development of diffuse abdominal discomfort, which she describes as constant achy pain over that time, with exacerbation in an immediately postprandial timeframe.  He denies bilateral lateral radiation of his abdominal discomfort into the back, without any radiation into the groin.  Denies any associated nausea or vomiting.  He also denies any recent diarrhea, melena, hematochezia.  No recent traveling.  He acknowledges a history of acute pancreatitis a few years ago, and reports that his presenting abdominal pain feels similar in distribution and quality relative to that which he was experiencing at the time of his episode of acute pancreatitis.  Denies any associated subjective fever, chills, rigors.  Denies any associated neck stiffness, rhinitis, rhinorrhea, sore throat, shortness breath, wheezing, cough.  He acknowledges that he was diagnosed with COVID-19 infection during the first week in January 2022, but denies any known recent COVID-19 exposures.  Denies any acute urinary symptoms, including no recent dysuria, gross hematuria, or change in urinary urgency/frequency.  He denies any recent chest pain, diaphoresis, palpitations, dizziness, presyncope, or syncope.  No recent trauma.   Denies any recent lower extremity edema, lower extremity erythema, calf tenderness, hemoptysis.  No known history of underlying malignancy.  Denies any periods of recent prolonged diminished ambulatory activity.  No personal history of prior DVT/PE  Medical history notable for paroxysmal atrial fibrillation.  The patient knowledges that he is not on chronic anticoagulation, and is also not on aspirin at home.  Specifically, denies taking any blood thinners whatsoever at home, including no aspirin, Plavix, Xarelto, Pradaxa, Eliquis, warfarin.  He acknowledges that he was previously prescribed Lopressor for his history of paroxysmal atrial fibrillation, but acknowledges  that he no longer takes that medication.  Not  currently on any AV nodal blocking agents at home.  Acknowledges typically consumes a sixpack of twelve oz beer per day, and has been drinking at this volume/frequency for a few years now.  He reports most recent alcohol consumption occurred earlier today.  Denies any history of alcohol withdrawal symptoms or seizures.     ED Course:  Vital signs in the ED were notable for the following: Tetramex 98.7, heart rate 78-85; blood pressure 123/84 -148/87; respiratory rate 16-18, oxygen saturation 97 to 99% on room air.  Labs were notable for the following: CMP was notable for the following: Sodium 136,  24, anion gap 9, creatinine 0.8, adjusted calcium 10.0, albumin 3.6, alkaline phosphatase 160 compared to 1 40-1 02/12/2020, AST 83, identical to value on 02/09/2020, ALT 56, compared to 43 on 02/09/2020, total bilirubin 1.0 compared to 0.9 on 02/09/2020.  Lipase 58, compared to 49 on 02/03/2020.  CBC notable for white blood cell count 15,000, hemoglobin 15.6, platelets 249.  INR 1.1.  Nasopharyngeal COVID-19/influenza PCR was checked in the ED today and found to be negative.  Abdominal ultrasound showed evidence of nonocclusive thrombus within the main portal vein and right portal vein, also showing cholelithiasis in the absence of any evidence of acute cholecystitis, choledocholithiasis, or common bile duct dilation.  Abdominal ultrasound also showed no evidence of acute pancreatic process.  While in the ED, the following were administered: Heparin bolus followed by initiation of heparin drip per inpatient pharmacy consultation.  Dilaudid 1 mg IV x1, morphine 4 mg IV x1.  Socially, the patient was admitted to the med telemetry floor for further evaluation and management of presenting nonocclusive portal vein thrombus.     Review of Systems: As per HPI otherwise 10 point review of systems negative.   Past Medical History:  Diagnosis Date  . MI (myocardial infarction) (HCC)     History reviewed. No  pertinent surgical history.  Social History:  reports that he has been smoking cigarettes. He has a 30.00 pack-year smoking history. He has never used smokeless tobacco. He reports current alcohol use of about 6.0 standard drinks of alcohol per week. He reports current drug use. Drug: Marijuana.   No Known Allergies  Family History  Problem Relation Age of Onset  . Cancer Mother   . Kidney disease Brother     Family history reviewed and not pertinent    Prior to Admission medications   Medication Sig Start Date End Date Taking? Authorizing Provider  oxyCODONE-acetaminophen (PERCOCET) 5-325 MG tablet Take 1 tablet by mouth every 4 (four) hours as needed for up to 3 days for severe pain. 05/30/20 06/02/20 Yes Joni Reining, PA-C  predniSONE (DELTASONE) 20 MG tablet Take by mouth. 05/20/20  Yes [provider]  acetaminophen (TYLENOL) 325 MG tablet Take 2 tablets (650 mg total) by mouth every 6 (six) hours as needed for mild pain or fever. Patient not taking: Reported on 06/01/2020 11/25/18   Lorretta Harp, MD  albuterol (VENTOLIN HFA) 108 (90 Base) MCG/ACT inhaler Inhale 2 puffs into the lungs every 4 (four) hours as needed for wheezing or shortness of breath. Patient not taking: Reported on 06/01/2020 01/22/20   Dionne Bucy, MD  aspirin EC 81 MG EC tablet Take 1 tablet (81 mg total) by mouth daily. Patient not taking: Reported on 06/01/2020 11/26/18   Lorretta Harp, MD  magnesium oxide (MAG-OX) 400 (241.3 Mg) MG tablet Take 1 tablet (400 mg  total) by mouth 2 (two) times daily. Patient not taking: No sig reported 02/26/20   Enedina Finner, MD  methocarbamol (ROBAXIN) 500 MG tablet Take 1 tablet (500 mg total) by mouth every 6 (six) hours as needed for muscle spasms. Patient not taking: No sig reported 05/30/20   Joni Reining, PA-C  methylPREDNISolone (MEDROL DOSEPAK) 4 MG TBPK tablet Take Tapered dose as directed Patient not taking: No sig reported 05/30/20   Joni Reining, PA-C   metoprolol tartrate (LOPRESSOR) 25 MG tablet Take 1 tablet (25 mg total) by mouth 2 (two) times daily. Patient not taking: No sig reported 02/26/20   Enedina Finner, MD  Multiple Vitamin (MULTIVITAMIN WITH MINERALS) TABS tablet Take 1 tablet by mouth daily. Patient not taking: No sig reported 02/26/20   Enedina Finner, MD  potassium chloride (KLOR-CON) 20 MEQ packet Take 20 mEq by mouth daily. Patient not taking: No sig reported 02/26/20   Enedina Finner, MD     Objective    Physical Exam: Vitals:   06/01/20 1255 06/01/20 1257 06/01/20 1700  BP: (!) 162/96  (!) 160/90  Pulse: 85  78  Resp: 18  18  Temp: 98.7 F (37.1 C)    SpO2: 99%  99%  Weight:  73.5 kg   Height:  5\' 9"  (1.753 m)     General: appears to be stated age; alert, oriented Skin: warm, dry, no rash Head:  AT/Ewing Mouth:  Oral mucosa membranes appear dry, normal dentition Neck: supple; trachea midline Heart:  RRR; did not appreciate any M/R/G Lungs: CTAB, did not appreciate any wheezes, rales, or rhonchi Abdomen: + BS; soft, ND; mild generalized tenderness to palpation, in the absence of any associated guarding, rigidity, or rebound tenderness Vascular: 2+ pedal pulses b/l; 2+ radial pulses b/l Extremities: no peripheral edema, no muscle wasting Neuro: strength and sensation intact in upper and lower extremities b/l    Labs on Admission: I have personally reviewed following labs and imaging studies  CBC: Recent Labs  Lab 06/01/20 1319  WBC 15.2*  NEUTROABS 12.2*  HGB 15.6  HCT 46.9  MCV 89.7  PLT 244   Basic Metabolic Panel: Recent Labs  Lab 06/01/20 1319  NA 136  K 3.8  CL 103  CO2 24  GLUCOSE 108*  BUN 12  CREATININE 0.80  CALCIUM 9.6   GFR: Estimated Creatinine Clearance: 93.3 mL/min (by C-G formula based on SCr of 0.8 mg/dL). Liver Function Tests: Recent Labs  Lab 06/01/20 1319  AST 83*  ALT 56*  ALKPHOS 160*  BILITOT 1.0  PROT 7.8  ALBUMIN 3.6   Recent Labs  Lab 06/01/20 1319   LIPASE 58*   No results for input(s): AMMONIA in the last 168 hours. Coagulation Profile: No results for input(s): INR, PROTIME in the last 168 hours. Cardiac Enzymes: No results for input(s): CKTOTAL, CKMB, CKMBINDEX, TROPONINI in the last 168 hours. BNP (last 3 results) No results for input(s): PROBNP in the last 8760 hours. HbA1C: No results for input(s): HGBA1C in the last 72 hours. CBG: No results for input(s): GLUCAP in the last 168 hours. Lipid Profile: No results for input(s): CHOL, HDL, LDLCALC, TRIG, CHOLHDL, LDLDIRECT in the last 72 hours. Thyroid Function Tests: No results for input(s): TSH, T4TOTAL, FREET4, T3FREE, THYROIDAB in the last 72 hours. Anemia Panel: No results for input(s): VITAMINB12, FOLATE, FERRITIN, TIBC, IRON, RETICCTPCT in the last 72 hours. Urine analysis:    Component Value Date/Time   COLORURINE YELLOW (A) 07/19/2019 1927  APPEARANCEUR HAZY (A) 07/19/2019 1927   LABSPEC 1.001 (L) 07/19/2019 1927   PHURINE 6.0 07/19/2019 1927   GLUCOSEU NEGATIVE 07/19/2019 1927   HGBUR NEGATIVE 07/19/2019 1927   BILIRUBINUR NEGATIVE 07/19/2019 1927   KETONESUR NEGATIVE 07/19/2019 1927   PROTEINUR NEGATIVE 07/19/2019 1927   NITRITE NEGATIVE 07/19/2019 1927   LEUKOCYTESUR NEGATIVE 07/19/2019 1927    Radiological Exams on Admission: US Abdomen Complete  Result Date: 06/01/2020 CLINICAL DATA:  Mid abdominal pain. EXAM: ABDOMEN ULTRASOUND COMPLETE COMPARISON:  February 03, 2020. FINDINGS: Gallbladder: Multiple shadowing echogenic gallstones are seen within the gallbladder lumen. The largest measures approximately 12 mm. There is no evidence of gallbladder wall thickening (2.3 mm). No sonographic Murphy sign noted by sonographer. Common bile duct: Diameter: Poorly visualized. Liver: An 8.1 cm x 5.4 cm x 5.1 cm echogenic area is noted within the liver. Diffusely increased echogenicity of the liver parenchyma is noted. Nonocclusive thrombus is seen within the main  portal vein and extends to the right portal vein. This represents a new finding when compared to the prior study. Normal direction of blood flow towards the liver. IVC: No abnormality visualized. Pancreas: Visualized portion unremarkable. Spleen: Size (8.3 cm) and appearance within normal limits. Right Kidney: Length: 12.8 cm. Echogenicity within normal limits. No mass or hydronephrosis visualized. Left Kidney: Length: 10.8 cm. Echogenicity within normal limits. No mass or hydronephrosis visualized. Abdominal aorta: No aneurysm visualized (2.5 cm AP diameter). Other findings: None. IMPRESSION: 1. Nonocclusive thrombus within the main portal vein and right portal vein, as described above. 2. Cholelithiasis. 3. Echogenic area within the right lobe of the liver which likely represents an area of fatty infiltration. Electronically Signed   By: Aram Candela M.D.   On: 06/01/2020 18:11      Assessment/Plan   Thomas Coffey is a 65 y.o. male with medical history significant for paroxysmal atrial fibrillation not chronically anticoagulated, coronary artery disease status post MI, chronic tobacco abuse, chronic alcohol abuse, acute pancreatitis, chronic transaminitis, who is admitted to [facility] on 06/01/2020 with nonocclusive portal vein thrombus after presenting from home to [facility] ED complaining of abdominal pain.    Principal Problem:   Portal vein thrombosis Active Problems:   Alcohol abuse   Tobacco dependence syndrome   Abdominal pain   Transaminitis   Leukocytosis   AF (paroxysmal atrial fibrillation) (HCC)    #) Nonocclusive portal vein thrombus: In the setting of 1 to 2 days of generalized abdominal discomfort, ultrasound of the abdomen demonstrates evidence of nonocclusive thrombus within the main portal vein and right portal vein.  Started on heparin drip via inpatient pharmacy consultation preceded by heparin bolus.  We will continue heparin drip, while  monitoring interval coags.  In general, patient's recent thrombophilic risk factors include diagnosis with COVID-19 during the first week of January 2022 in the context of its associated hypercoagulable state, in addition to a history of paroxysmal atrial fibrillation for which the patient is not chronically anticoagulated.  Otherwise, patient does not appear to possess significant elements of Virchow's triad at this time.  He confirms that he is on no blood thinners at all as an outpatient, including no aspirin.  Plan: Heparin drip via inpatient pharmacy consultation, as above.  Repeat INR in the morning.  Repeat CBC at that time as well.  Repeat CMP.  As needed IV morphine for abdominal discomfort      #) Degenerative disc disease of the lumbar spine: Recent plain films of the lumbar  spine were suggestive of moderate degenerative disc disease throughout the lumbar spine, with the nature of patient's recent back pain suggestive of radiculopathy, with radiation of the patient's low back discomfort into the right lower extremity.  He was referred to orthopedic surgery clinic, with his appointment scheduled for tomorrow (06/02/20).  As it is anticipated that the patient will miss this appointment, he requests assistance with rescheduling his appointment to see orthopedic surgery on an outpatient basis.  Plan: As needed morphine.      #) Chronic noncholestatic transaminitis: Per review the patient's liver enzymes, it appears that he has chronic transaminitis and a noncholestatic pattern, potentially in the setting of his history of chronic alcohol abuse.  Liver enzymes performed in the ED today are consistent with his baseline and most recent liver enzyme values, which were performed in January 2022.  Still no evidence of cholestasis.  Ultrasound of the abdomen performed today while showing previously described nonocclusive thrombus within the portal vein, otherwise showed no evidence of acute  intra-abdominal process.  Specifically, while showing cholelithiasis, abdominal ultrasound showed no evidence of acute cholecystitis, choledocholithiasis, or dilation of the common bile duct, also showing no acute pancreatic process.  Plan: Counseled the patient on the importance of reduction in his alcohol consumption.  I placed a consult to the transition of care team.  Check INR.  Repeat CMP in the morning.       #) Leukocytosis: Presenting CBC reflects white blood cell count of 15,000.  No evidence of underlying infectious process at this time, including COVID-19/influenza PCR performed in the ED today which were found to be negative.  No acute respiratory symptoms.  No evidence of acute cholecystitis per today's abdominal ultrasound.  Additionally, the patient denies any acute urinary symptoms, although we will check urinalysis to further evaluate for any underlying urinary tract infection given his report of acute abdominal pain.  Suspect contribution from hemoconcentration given the patient's report of slight decline in his oral intake over the last 1 to 2 days given his postprandial exacerbation of abdominal discomfort over that timeframe.  There may also be an inflammatory contribution to the patient's mildly cytosis in the setting of nonocclusive portal vein thrombus.  Plan: Check urinalysis.  Repeat CBC in the morning.  Gentle overnight IV fluids via banana bag x1.       #) Paroxysmal atrial fibrillation: Documented history of such. In the setting of a previous CHA2DS2-VASc score of 1, for history CAD s/p MI, there is was previously no indication for the patient to be on chronic anticoagulation for thromboembolic prophylaxis.  However, in the setting of presenting nonocclusive thrombus of the portal vein it is unclear to me if this new finding will increase the patient's CHA2DS2-VASc score by 1, which would then criteria for chronic anticoagulation for thromboembolic prophylaxis.  As of  today, he is on heparin drip in the setting of aforementioned nonocclusive portal vein thrombus.  Not on any AV nodal blocking agents at home, although he was previously prescribed Lopressor.  Most recent echocardiogram was performed in October 2020 and showed normal left ventricular cavity size, LVEF 60 to 65%, mild LVH, normal-appearing bilateral atria, and trivial tricuspid regurgitation.  Appears to be in sinus rhythm at this time.   Plan: monitor strict I's & O's and daily weights. Repeat BMP and CBC in the morning. Check serum magnesium level.  Anticipate additional discussions with patient regarding indications versus risk versus benefits for his alternatives to chronic anticoagulation for thromboembolic prophylaxis  in the setting of his history of paroxysmal atrial fibrillation.      #) Chronic tobacco abuse: The patient knowledges that he is a current smoker, having smoked 1 pack/day for greater than 30 years.  Plan: Counseled the patient on the importance of complete smoking discontinuation, particular given his history of coronary artery disease.      #) Chronic Alcohol Abuse: the patient reports typical daily alcohol consumption of a sixpack of twelve oz beers, and that typical daily alcohol consumption of this volume has been occurring for a few years.  Most recent alcohol consumption occurred earlier in the day on 06/01/2020.  Close monitoring for development of evidence of alcohol withdrawal, including close attention to trend and vital signs, as well as monitoring of electrolytes, as further described below.  No clinical evidence to suggest current alcohol withdrawal.   Plan: counseled the patient on the importance of reduction in alcohol consumption. Consult to transition of care team placed. Close monitoring of ensuing BP and HR via routine VS. Symptoms-based CIWA protocol with prn Ativan ordered. Telemetry. Add-on serum Mg level. Check serum phosphorus level and ionized calcium  level.  Repeat CMP in the morning. Check INR.  Banana bag x 1 has been ordered, and will be administered at a rate of 100 cc/h overnight, following which I have ordered daily oral thiamine/folic acid supplementation as well as daily multivitamin, all of which to start on the morning of 06/03/2020.     DVT prophylaxis: Heparin drip Code Status: Full code Family Communication: none Disposition Plan: Per Rounding Team Consults called: none  Admission status: Inpatient; med telemetry     Of note, this patient was added by me to the following Admit List/Treatment Team: armcadmits.      PLEASE NOTE THAT DRAGON DICTATION SOFTWARE WAS USED IN THE CONSTRUCTION OF THIS NOTE.   Angie Fava DO [facility] Pager [phone] From 6PM - 2AM  Otherwise, please contact night-coverage  [email or web address] Password [facility]   06/01/2020, 7:24 PM

## 2020-06-02 ENCOUNTER — Encounter: Payer: Self-pay | Admitting: Internal Medicine

## 2020-06-02 DIAGNOSIS — R7401 Elevation of levels of liver transaminase levels: Secondary | ICD-10-CM | POA: Diagnosis present

## 2020-06-02 DIAGNOSIS — I480 Paroxysmal atrial fibrillation: Secondary | ICD-10-CM | POA: Diagnosis present

## 2020-06-02 DIAGNOSIS — R109 Unspecified abdominal pain: Secondary | ICD-10-CM | POA: Diagnosis present

## 2020-06-02 DIAGNOSIS — D72829 Elevated white blood cell count, unspecified: Secondary | ICD-10-CM | POA: Diagnosis present

## 2020-06-02 LAB — URINALYSIS, ROUTINE W REFLEX MICROSCOPIC
Bilirubin Urine: NEGATIVE
Glucose, UA: NEGATIVE mg/dL
Hgb urine dipstick: NEGATIVE
Ketones, ur: NEGATIVE mg/dL
Nitrite: NEGATIVE
Protein, ur: NEGATIVE mg/dL
Specific Gravity, Urine: 1.018 (ref 1.005–1.030)
pH: 5 (ref 5.0–8.0)

## 2020-06-02 LAB — CBC
HCT: 42.1 % (ref 39.0–52.0)
HCT: 42.4 % (ref 39.0–52.0)
Hemoglobin: 13.8 g/dL (ref 13.0–17.0)
Hemoglobin: 14.1 g/dL (ref 13.0–17.0)
MCH: 29.2 pg (ref 26.0–34.0)
MCH: 29.7 pg (ref 26.0–34.0)
MCHC: 32.8 g/dL (ref 30.0–36.0)
MCHC: 33.3 g/dL (ref 30.0–36.0)
MCV: 89.2 fL (ref 80.0–100.0)
MCV: 89.3 fL (ref 80.0–100.0)
Platelets: 250 10*3/uL (ref 150–400)
Platelets: 233 10*3/uL (ref 150–400)
RBC: 4.72 MIL/uL (ref 4.22–5.81)
RBC: 4.75 MIL/uL (ref 4.22–5.81)
RDW: 15.1 % (ref 11.5–15.5)
RDW: 15.3 % (ref 11.5–15.5)
WBC: 12.8 10*3/uL — ABNORMAL HIGH (ref 4.0–10.5)
WBC: 13.9 10*3/uL — ABNORMAL HIGH (ref 4.0–10.5)
nRBC: 0 % (ref 0.0–0.2)
nRBC: 0 % (ref 0.0–0.2)

## 2020-06-02 LAB — HEPARIN LEVEL (UNFRACTIONATED)
Heparin Unfractionated: 0.19 IU/mL — ABNORMAL LOW (ref 0.30–0.70)
Heparin Unfractionated: 0.39 IU/mL (ref 0.30–0.70)
Heparin Unfractionated: 0.26 IU/mL — ABNORMAL LOW (ref 0.30–0.70)

## 2020-06-02 LAB — COMPREHENSIVE METABOLIC PANEL
ALT: 41 U/L (ref 0–44)
AST: 59 U/L — ABNORMAL HIGH (ref 15–41)
Albumin: 3.1 g/dL — ABNORMAL LOW (ref 3.5–5.0)
Alkaline Phosphatase: 144 U/L — ABNORMAL HIGH (ref 38–126)
Anion gap: 9 (ref 5–15)
BUN: 13 mg/dL (ref 8–23)
CO2: 23 mmol/L (ref 22–32)
Calcium: 9.1 mg/dL (ref 8.9–10.3)
Chloride: 104 mmol/L (ref 98–111)
Creatinine, Ser: 0.72 mg/dL (ref 0.61–1.24)
GFR, Estimated: >60 mL/min (ref >60)
Glucose, Bld: 148 mg/dL — ABNORMAL HIGH (ref 70–99)
Potassium: 3.6 mmol/L (ref 3.5–5.1)
Sodium: 136 mmol/L (ref 135–145)
Total Bilirubin: 0.9 mg/dL (ref 0.3–1.2)
Total Protein: 6.8 g/dL (ref 6.5–8.1)

## 2020-06-02 LAB — PROTIME-INR
INR: 1.2 (ref 0.8–1.2)
Prothrombin Time: 14.8 seconds (ref 11.4–15.2)

## 2020-06-02 LAB — MAGNESIUM: Magnesium: 1.6 mg/dL — ABNORMAL LOW (ref 1.7–2.4)

## 2020-06-02 LAB — PHOSPHORUS: Phosphorus: 4.3 mg/dL (ref 2.5–4.6)

## 2020-06-02 MED ORDER — THIAMINE HCL 100 MG/ML IJ SOLN
Freq: Once | INTRAVENOUS | Status: AC
  Filled 2020-06-02: qty 1000

## 2020-06-02 MED ORDER — LORAZEPAM 2 MG/ML IJ SOLN: 1.0000 mg | INTRAMUSCULAR | Status: DC | PRN

## 2020-06-02 MED ORDER — NICOTINE 14 MG/24HR TD PT24
14.0000 mg | MEDICATED_PATCH | TRANSDERMAL | Status: DC
  Administered 2020-06-02: 14 mg via TRANSDERMAL
  Filled 2020-06-02: qty 1

## 2020-06-02 MED ORDER — HEPARIN BOLUS VIA INFUSION
900.0000 [IU] | Freq: Once | INTRAVENOUS | Status: AC
  Administered 2020-06-02: 900 [IU] via INTRAVENOUS
  Filled 2020-06-02: qty 900

## 2020-06-02 MED ORDER — MAGNESIUM SULFATE 2 GM/50ML IV SOLN
2.0000 g | Freq: Once | INTRAVENOUS | Status: AC
  Administered 2020-06-02: 2 g via INTRAVENOUS
  Filled 2020-06-02: qty 50

## 2020-06-02 MED ORDER — FOLIC ACID 1 MG PO TABS
1.0000 mg | ORAL_TABLET | Freq: Every day | ORAL | Status: DC
  Administered 2020-06-03: 1 mg via ORAL
  Filled 2020-06-02: qty 1

## 2020-06-02 MED ORDER — THIAMINE HCL 100 MG PO TABS
100.0000 mg | ORAL_TABLET | Freq: Every day | ORAL | Status: DC
  Administered 2020-06-03: 100 mg via ORAL
  Filled 2020-06-02: qty 1

## 2020-06-02 MED ORDER — HYDROMORPHONE HCL 1 MG/ML IJ SOLN
1.0000 mg | INTRAMUSCULAR | Status: DC | PRN
  Administered 2020-06-02 – 2020-06-03 (×6): 1 mg via INTRAVENOUS
  Filled 2020-06-02 (×6): qty 1

## 2020-06-02 MED ORDER — METOPROLOL TARTRATE 25 MG PO TABS
25.0000 mg | ORAL_TABLET | Freq: Two times a day (BID) | ORAL | Status: DC
  Administered 2020-06-02 – 2020-06-03 (×3): 25 mg via ORAL
  Filled 2020-06-02 (×3): qty 1

## 2020-06-02 MED ORDER — LORAZEPAM 1 MG PO TABS
1.0000 mg | ORAL_TABLET | ORAL | Status: DC | PRN
Start: 2020-06-02 — End: 2020-06-03
  Administered 2020-06-02 – 2020-06-03 (×3): 2 mg via ORAL
  Administered 2020-06-03: 4 mg via ORAL
  Filled 2020-06-02: qty 4
  Filled 2020-06-02 (×3): qty 2

## 2020-06-02 MED ORDER — OXYCODONE-ACETAMINOPHEN 7.5-325 MG PO TABS
1.0000 | ORAL_TABLET | Freq: Four times a day (QID) | ORAL | Status: DC | PRN
  Administered 2020-06-02 – 2020-06-03 (×3): 1 via ORAL
  Filled 2020-06-02 (×3): qty 1

## 2020-06-02 MED ORDER — HEPARIN BOLUS VIA INFUSION
2000.0000 [IU] | INTRAVENOUS | Status: AC
  Administered 2020-06-02: 2000 [IU] via INTRAVENOUS
  Filled 2020-06-02: qty 2000

## 2020-06-02 MED ORDER — OCUVITE-LUTEIN PO CAPS
1.0000 | ORAL_CAPSULE | Freq: Every day | ORAL | Status: DC
  Administered 2020-06-03: 1 via ORAL
  Filled 2020-06-02: qty 1

## 2020-06-02 NOTE — Consult Note (Signed)
ANTICOAGULATION CONSULT NOTE  Pharmacy Consult for IV Heparin Indication: portal vein thrombosis  Patient Measurements: Heparin Dosing Weight: 73.5 kg  Labs: Recent Labs    06/01/20 1319 06/01/20 1941 06/02/20 0208  HGB 15.6  --  13.8  HCT 46.9  --  42.1  PLT 244  --  250  APTT  --  31  --   LABPROT  --  14.5  --   INR  --  1.1  --   HEPARINUNFRC  --   --  0.19*  CREATININE 0.80  --   --     Estimated Creatinine Clearance: 93.3 mL/min (by C-G formula based on SCr of 0.8 mg/dL).   Medical History: Past Medical History:  Diagnosis Date  . MI (myocardial infarction) (HCC)     Medications:  No anticoagulation prior to admission per my chart review. Medication reconciliation is pending  Assessment: Patient is a 65 y/o M with medical history as above who presented to the ED 5/17 with back pain. Ultrasound revealed portal vein thrombosis. Pharmacy has been consulted to initiate heparin infusion for DVT treatment.  Baseline CBC within normal limits. Baseline aPTT and PT-INR pending.  Goal of Therapy:  Heparin level 0.3-0.7 units/ml Monitor platelets by anticoagulation protocol: Yes   Plan:  --Heparin 2000 unit IV bolus --Increase infusion to 1450 units/hr --Recheck HL 6 hrs after rate change --Daily CBC per protocol while on IV heparin  Otelia Sergeant, PharmD, [facility] 06/02/2020 2:59 AM

## 2020-06-02 NOTE — Consult Note (Signed)
ANTICOAGULATION CONSULT NOTE  Pharmacy Consult for IV Heparin Indication: portal vein thrombosis  Patient Measurements: Heparin Dosing Weight: 73.5 kg  Labs: Recent Labs    06/01/20 1319 06/01/20 1941 06/02/20 0208 06/02/20 0547 06/02/20 0933 06/02/20 1546  HGB 15.6  --  13.8 14.1  --   --   HCT 46.9  --  42.1 42.4  --   --   PLT 244  --  250 233  --   --   APTT  --  31  --   --   --   --   LABPROT  --  14.5  --  14.8  --   --   INR  --  1.1  --  1.2  --   --   HEPARINUNFRC  --   --  0.19*  --  0.39 0.26*  CREATININE 0.80  --   --  0.72  --   --     Estimated Creatinine Clearance: 84.7 mL/min (by C-G formula based on SCr of 0.72 mg/dL).   Medical History: Past Medical History:  Diagnosis Date  . Acute pancreatitis   . COVID-19 virus infection    positive COVID-19 test on 01/22/20  . MI (myocardial infarction) (HCC)   . Paroxysmal atrial fibrillation (HCC)     Medications:  No anticoagulation prior to admission per my chart review. Medication reconciliation is complete  Assessment: Patient is a 65 y/o M with medical history as above who presented to the ED 5/17 with back pain. Ultrasound revealed portal vein thrombosis. Pharmacy has been consulted to initiate heparin infusion for DVT treatment. H&H, platelets wnl and stable  5/18 0933 HL 0.39  5/18 1546 HL 0.26  Goal of Therapy:  Heparin level 0.3-0.7 units/ml Monitor platelets by anticoagulation protocol: Yes   Plan:   Anti-Xa level subtherapeutic: Will give heparin bolus of 900 units and increase the heparin infusion to 1550 units/hr.  continue heparin infusion at 1450 units/hr  Recheck Anti-Xa level in 6 hours and CBC daily while on heparin.  Paschal Dopp, PharmD 06/02/2020 4:47 PM

## 2020-06-02 NOTE — Progress Notes (Signed)
PHARMACY CONSULT NOTE  Pharmacy Consult for Electrolyte Monitoring and Replacement   Recent Labs: Potassium (mmol/L)  Date Value  06/02/2020 3.6   Magnesium (mg/dL)  Date Value  33/29/5188 1.6 (L)   Calcium (mg/dL)  Date Value  41/66/0630 9.1   Albumin (g/dL)  Date Value  16/01/930 3.1 (L)   Phosphorus (mg/dL)  Date Value  35/57/3220 4.3   Sodium (mmol/L)  Date Value  06/02/2020 136     Assessment: 65 year old male who is admitted with nonocclusive portal vein thrombus after presenting from home to [facility] ED for generalized abdominal discomfort, that was preceded by right lower back pain.  Imaging reveals nonocclusive portal vein thrombus. Pharmacy was asked to follow. Given history of EtOH abuse there is the likelihood of re-feeding syndrome.   Goal of Therapy:  Electrolytes WNL  Plan:   Replace magnesium with 2 grams IV magnesium sulfate  Re-check electrolytes in am  Thomas Coffey ,PharmD Clinical Pharmacist 06/02/2020 11:35 AM

## 2020-06-02 NOTE — Progress Notes (Signed)
In to ask patient about ambulating.  Patient stated he could get up when he wanted to and he did not need help.  Patient refused to take off clothes.

## 2020-06-02 NOTE — TOC Initial Note (Signed)
Transition of Care [facility]) - Initial/Assessment Note    Patient Details  Name: Thomas Coffey MRN: [id number] Date of Birth: 02-Dec-1955  [facility]) CM/SW Contact:    Candie Chroman, LCSW Phone Number: 06/02/2020, 9:27 AM  Clinical Narrative:  CSW met with patient. No supports at bedside. CSW introduced role and inquired about interest in resources for ETOH use treatment. Patient declined resources. Patient asked CSW to find him somewhere to live. He currently lives with his son. CSW offered resources for the [employer] and [employer] but he declined, stating that every time he calls them they are full. No further concerns. CSW encouraged patient to contact CSW as needed. CSW will continue to follow patient for support and facilitate return home when stable.              Expected Discharge Plan: Home/Self Care Barriers to Discharge: Continued Medical Work up   Patient Goals and CMS Choice        Expected Discharge Plan and Services Expected Discharge Plan: Home/Self Care     Post Acute Care Choice: NA Living arrangements for the past 2 months: Single Family Home                                      Prior Living Arrangements/Services Living arrangements for the past 2 months: Single Family Home Lives with:: Adult Children Patient language and need for interpreter reviewed:: Yes Do you feel safe going back to the place where you live?: Yes      Need for Family Participation in Patient Care: Yes (Comment) Care giver support system in place?: Yes (comment)   Criminal Activity/Legal Involvement Pertinent to Current Situation/Hospitalization: No - Comment as needed  Activities of Daily Living Home Assistive Devices/Equipment: None ADL Screening (condition at time of admission) Patient's cognitive ability adequate to safely complete daily activities?: Yes Is the patient deaf or have difficulty hearing?: No Does the patient have difficulty  seeing, even when wearing glasses/contacts?: Yes Does the patient have difficulty concentrating, remembering, or making decisions?: No Patient able to express need for assistance with ADLs?: Yes Does the patient have difficulty dressing or bathing?: No Independently performs ADLs?: Yes (appropriate for developmental age) Does the patient have difficulty walking or climbing stairs?: No Weakness of Legs: None Weakness of Arms/Hands: None  Permission Sought/Granted                  Emotional Assessment Appearance:: Appears stated age Attitude/Demeanor/Rapport: Engaged Affect (typically observed): Frustrated Orientation: : Oriented to Self,Oriented to Place,Oriented to  Time,Oriented to Situation Alcohol / Substance Use: Alcohol Use,Tobacco Use Psych Involvement: No (comment)  Admission diagnosis:  Portal vein thrombosis [I81] Left upper quadrant abdominal pain [R10.12] Patient Active Problem List   Diagnosis Date Noted  . Abdominal pain 06/02/2020  . Transaminitis 06/02/2020  . Leukocytosis 06/02/2020  . AF (paroxysmal atrial fibrillation) ([place]) 06/02/2020  . Portal vein thrombosis 06/01/2020  . Acute metabolic encephalopathy   . Failure to thrive in adult 02/13/2020  . COVID-19 01/29/2020  . COVID-19 virus infection 01/28/2020  . Abnormal LFTs 01/28/2020  . CAD (coronary artery disease) 01/28/2020  . Atrial fibrillation, chronic ([place]) 01/28/2020  . Acute respiratory disease due to COVID-19 virus 01/28/2020  . Tobacco abuse 01/28/2020  . Acute pancreatitis 07/20/2019  . Hypomagnesemia 11/24/2018  . Protein-calorie malnutrition, severe 11/23/2018  . Pleural effusion on left   .  Liver function test abnormality   . AKI (acute kidney injury) ([place])   . Atrial fibrillation with rapid ventricular response ([place])   . Acute hepatitis 11/17/2018  . Atrial fibrillation with RVR ([place]) 11/17/2018  . Alcohol abuse 11/17/2018  . Tobacco dependence syndrome 11/17/2018  . Empyema  ([place])    PCP:  Letta Median, MD Pharmacy:   [facility] [id number], [place] [address] Phone: [phone] Fax: [phone]     Social Determinants of Health (SDOH) Interventions    Readmission Risk Interventions No flowsheet data found.

## 2020-06-02 NOTE — Progress Notes (Addendum)
PROGRESS NOTE    Thomas Coffey  HGD:[id number] DOB: [birth year] DOA: 06/01/2020 PCP: Oswaldo Conroy, MD    Assessment & Plan:   Principal Problem:   Portal vein thrombosis Active Problems:   Alcohol abuse   Tobacco dependence syndrome   Abdominal pain   Transaminitis   Leukocytosis   AF (paroxysmal atrial fibrillation) (HCC)   Nonocclusive portal vein thrombosis: as per [place] of abd. Continue on IV heparin drip.  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   Chronic alcohol abuse: alcohol abuse cessation counseling. Continue on CIWA protocol  Leukocytosis: likely reactive. Will continue to monitor   PAF: will restart home dose of metoprolol. Not on chronic anticoagulation as per med rec.   Smoker: smokes 1 ppd x at least 30 years. Smoking cessation counseling. Nicotine patch to prevent w/drawal        DVT prophylaxis: IV heparin  Code Status: full  Family Communication:  Disposition Plan: likely d/c back home   Level of care: Med-Surg   Status is: Inpatient  Remains inpatient appropriate because:IV treatments appropriate due to intensity of illness or inability to take PO and Inpatient level of care appropriate due to severity of illness   Dispo: The patient is from: Home              Anticipated d/c is to: Home              Patient currently is not medically stable to d/c.   Difficult to place patient Yes        Consultants:      Procedures:    Antimicrobials:   Subjective: Pt c/o abd pain   Objective: Vitals:   06/01/20 2220 06/01/20 2244 06/02/20 0511 06/02/20 0733  BP:  (!) 158/96 136/86 (!) 145/94  Pulse:  (!) 103 99 90  Resp:  16 20 18   Temp: 98 F (36.7 C) 97.8 F (36.6 C) 98.2 F (36.8 C) 98.5 F (36.9 C)  TempSrc: Oral Oral Oral Oral  SpO2:  99% 97% 99%  Weight:   64.2 kg   Height:        Intake/Output Summary (Last 24  hours) at 06/02/2020 0807 Last data filed at 06/01/2020 2338 Gross per 24 hour  Intake --  Output 100 ml  Net -100 ml   Filed Weights   06/01/20 1257 06/02/20 0511  Weight: 73.5 kg 64.2 kg    Examination:  General exam: Appears calm and comfortable. Appears disheveled  Respiratory system: Clear to auscultation. Respiratory effort normal. Cardiovascular system: S1 & S2 +. No  rubs, gallops or clicks.  Gastrointestinal system: Abdomen is nondistended, soft and nontender. Normal bowel sounds heard. Central nervous system: Alert and oriented. Moves all extremities  Psychiatry: Judgement and insight appear normal. Flat mood and affect    Data Reviewed: I have personally reviewed following labs and imaging studies  CBC: Recent Labs  Lab 06/01/20 1319 06/02/20 0208 06/02/20 0547  WBC 15.2* 12.8* 13.9*  NEUTROABS 12.2*  --   --   HGB 15.6 13.8 14.1  HCT 46.9 42.1 42.4  MCV 89.7 89.2 89.3  PLT 244 250 233   Basic Metabolic Panel: Recent Labs  Lab 06/01/20 1319 06/02/20 0547  NA 136 136  K 3.8 3.6  CL 103 104  CO2 24 23  GLUCOSE 108* 148*  BUN 12 13  CREATININE 0.80 0.72  CALCIUM 9.6 9.1  MG  --  1.6*  PHOS  --  4.3   GFR: Estimated Creatinine Clearance: 84.7 mL/min (by C-G formula based on SCr of 0.72 mg/dL). Liver Function Tests: Recent Labs  Lab 06/01/20 1319 06/02/20 0547  AST 83* 59*  ALT 56* 41  ALKPHOS 160* 144*  BILITOT 1.0 0.9  PROT 7.8 6.8  ALBUMIN 3.6 3.1*   Recent Labs  Lab 06/01/20 1319  LIPASE 58*   No results for input(s): AMMONIA in the last 168 hours. Coagulation Profile: Recent Labs  Lab 06/01/20 1941 06/02/20 0547  INR 1.1 1.2   Cardiac Enzymes: No results for input(s): CKTOTAL, CKMB, CKMBINDEX, TROPONINI in the last 168 hours. BNP (last 3 results) No results for input(s): PROBNP in the last 8760 hours. HbA1C: No results for input(s): HGBA1C in the last 72 hours. CBG: No results for input(s): GLUCAP in the last 168  hours. Lipid Profile: No results for input(s): CHOL, HDL, LDLCALC, TRIG, CHOLHDL, LDLDIRECT in the last 72 hours. Thyroid Function Tests: No results for input(s): TSH, T4TOTAL, FREET4, T3FREE, THYROIDAB in the last 72 hours. Anemia Panel: No results for input(s): VITAMINB12, FOLATE, FERRITIN, TIBC, IRON, RETICCTPCT in the last 72 hours. Sepsis Labs: No results for input(s): PROCALCITON, LATICACIDVEN in the last 168 hours.  Recent Results (from the past 240 hour(s))  Resp Panel by RT-PCR (Flu A&B, Covid) Nasopharyngeal Swab     Status: None   Collection Time: 06/01/20  7:04 PM   Specimen: Nasopharyngeal Swab; Nasopharyngeal(NP) swabs in vial transport medium  Result Value Ref Range Status   SARS Coronavirus 2 by RT PCR NEGATIVE NEGATIVE Final    Comment: (NOTE) SARS-CoV-2 target nucleic acids are NOT DETECTED.  The SARS-CoV-2 RNA is generally detectable in upper respiratory specimens during the acute phase of infection. The lowest concentration of SARS-CoV-2 viral copies this assay can detect is 138 copies/mL. A negative result does not preclude SARS-Cov-2 infection and should not be used as the sole basis for treatment or other patient management decisions. A negative result may occur with  improper specimen collection/handling, submission of specimen other than nasopharyngeal swab, presence of viral mutation(s) within the areas targeted by this assay, and inadequate number of viral copies(<138 copies/mL). A negative result must be combined with clinical observations, patient history, and epidemiological information. The expected result is Negative.  Fact Sheet for Patients:  BloggerCourse.com  Fact Sheet for Healthcare Providers:  SeriousBroker.it  This test is no t yet approved or cleared by the [place] FDA and  has been authorized for detection and/or diagnosis of SARS-CoV-2 by FDA under an Emergency Use Authorization  (EUA). This EUA will remain  in effect (meaning this test can be used) for the duration of the COVID-19 declaration under Section 564(b)(1) of the Act, 21 U.S.C.section 360bbb-3(b)(1), unless the authorization is terminated  or revoked sooner.       Influenza A by PCR NEGATIVE NEGATIVE Final   Influenza B by PCR NEGATIVE NEGATIVE Final    Comment: (NOTE) The Xpert Xpress SARS-CoV-2/FLU/RSV plus assay is intended as an aid in the diagnosis of influenza from Nasopharyngeal swab specimens and should not be used as a sole basis for treatment. Nasal washings and aspirates are unacceptable for Xpert Xpress SARS-CoV-2/FLU/RSV testing.  Fact Sheet for Patients: BloggerCourse.com  Fact Sheet for Healthcare Providers: SeriousBroker.it  This test is not yet approved or cleared by the [place] FDA and has been authorized for detection and/or diagnosis of SARS-CoV-2 by FDA under an Emergency Use Authorization (EUA). This EUA will  remain in effect (meaning this test can be used) for the duration of the COVID-19 declaration under Section 564(b)(1) of the Act, 21 U.S.C. section 360bbb-3(b)(1), unless the authorization is terminated or revoked.  Performed at [facility], [address], [place], [place] [address]          Radiology Studies: US Abdomen Complete  Result Date: 06/01/2020 CLINICAL DATA:  Mid abdominal pain. EXAM: ABDOMEN ULTRASOUND COMPLETE COMPARISON:  February 03, 2020. FINDINGS: Gallbladder: Multiple shadowing echogenic gallstones are seen within the gallbladder lumen. The largest measures approximately 12 mm. There is no evidence of gallbladder wall thickening (2.3 mm). No sonographic Murphy sign noted by sonographer. Common bile duct: Diameter: Poorly visualized. Liver: An 8.1 cm x 5.4 cm x 5.1 cm echogenic area is noted within the liver. Diffusely increased echogenicity of the liver parenchyma is noted.  Nonocclusive thrombus is seen within the main portal vein and extends to the right portal vein. This represents a new finding when compared to the prior study. Normal direction of blood flow towards the liver. IVC: No abnormality visualized. Pancreas: Visualized portion unremarkable. Spleen: Size (8.3 cm) and appearance within normal limits. Right Kidney: Length: 12.8 cm. Echogenicity within normal limits. No mass or hydronephrosis visualized. Left Kidney: Length: 10.8 cm. Echogenicity within normal limits. No mass or hydronephrosis visualized. Abdominal aorta: No aneurysm visualized (2.5 cm AP diameter). Other findings: None. IMPRESSION: 1. Nonocclusive thrombus within the main portal vein and right portal vein, as described above. 2. Cholelithiasis. 3. Echogenic area within the right lobe of the liver which likely represents an area of fatty infiltration. Electronically Signed   By: Aram Candela M.D.   On: 06/01/2020 18:11        Scheduled Meds: . [START ON 06/03/2020] folic acid  1 mg Oral Daily  . [START ON 06/03/2020] multivitamin-lutein  1 capsule Oral Daily  . nicotine  14 mg Transdermal Once  . [START ON 06/03/2020] thiamine  100 mg Oral Daily   Continuous Infusions: . heparin 1,450 Units/hr (06/02/20 0434)     LOS: 1 day    Time spent: 30 mins     Charise Killian, MD [facility] Pager [phone]  If 7PM-7AM, please contact night-coverage 06/02/2020, 8:07 AM

## 2020-06-02 NOTE — Progress Notes (Signed)
Patient starting to get agitated, shaking, complaining of nausea and vomiting.  Medicated with ativan po and 30 minutes later medicated with dilaudid and zofran

## 2020-06-02 NOTE — Plan of Care (Signed)

## 2020-06-02 NOTE — Consult Note (Signed)
ANTICOAGULATION CONSULT NOTE  Pharmacy Consult for IV Heparin Indication: portal vein thrombosis  Patient Measurements: Heparin Dosing Weight: 73.5 kg  Labs: Recent Labs    06/01/20 1319 06/01/20 1941 06/02/20 0208 06/02/20 0547  HGB 15.6  --  13.8 14.1  HCT 46.9  --  42.1 42.4  PLT 244  --  250 233  APTT  --  31  --   --   LABPROT  --  14.5  --  14.8  INR  --  1.1  --  1.2  HEPARINUNFRC  --   --  0.19*  --   CREATININE 0.80  --   --  0.72    Estimated Creatinine Clearance: 84.7 mL/min (by C-G formula based on SCr of 0.72 mg/dL).   Medical History: Past Medical History:  Diagnosis Date  . Acute pancreatitis   . COVID-19 virus infection    positive COVID-19 test on 01/22/20  . MI (myocardial infarction) (HCC)   . Paroxysmal atrial fibrillation (HCC)     Medications:  No anticoagulation prior to admission per my chart review. Medication reconciliation is complete  Assessment: Patient is a 65 y/o M with medical history as above who presented to the ED 5/17 with back pain. Ultrasound revealed portal vein thrombosis. Pharmacy has been consulted to initiate heparin infusion for DVT treatment. H&H, platelets wnl and stable  Goal of Therapy:  Heparin level 0.3-0.7 units/ml Monitor platelets by anticoagulation protocol: Yes   Plan:   Anti-Xa level therapeutic: continue heparin infusion at 1450 units/hr  Recheck Anti-Xa level in 6 hours to confirm: if 2 consecutive therapeutic levels we can move to checking once daily per protocol  Daily CBC per protocol while on IV heparin  Burnis Medin, PharmD 06/02/2020 10:05 AM

## 2020-06-03 LAB — COMPREHENSIVE METABOLIC PANEL
ALT: 44 U/L (ref 0–44)
AST: 63 U/L — ABNORMAL HIGH (ref 15–41)
Albumin: 3.1 g/dL — ABNORMAL LOW (ref 3.5–5.0)
Alkaline Phosphatase: 138 U/L — ABNORMAL HIGH (ref 38–126)
Anion gap: 8 (ref 5–15)
BUN: 11 mg/dL (ref 8–23)
CO2: 24 mmol/L (ref 22–32)
Calcium: 9.2 mg/dL (ref 8.9–10.3)
Chloride: 105 mmol/L (ref 98–111)
Creatinine, Ser: 0.59 mg/dL — ABNORMAL LOW (ref 0.61–1.24)
GFR, Estimated: >60 mL/min (ref >60)
Glucose, Bld: 134 mg/dL — ABNORMAL HIGH (ref 70–99)
Potassium: 3.8 mmol/L (ref 3.5–5.1)
Sodium: 137 mmol/L (ref 135–145)
Total Bilirubin: 0.9 mg/dL (ref 0.3–1.2)
Total Protein: 6.7 g/dL (ref 6.5–8.1)

## 2020-06-03 LAB — CBC
HCT: 42.1 % (ref 39.0–52.0)
Hemoglobin: 13.8 g/dL (ref 13.0–17.0)
MCH: 29.4 pg (ref 26.0–34.0)
MCHC: 32.8 g/dL (ref 30.0–36.0)
MCV: 89.8 fL (ref 80.0–100.0)
Platelets: 234 10*3/uL (ref 150–400)
RBC: 4.69 MIL/uL (ref 4.22–5.81)
RDW: 15.2 % (ref 11.5–15.5)
WBC: 11.2 10*3/uL — ABNORMAL HIGH (ref 4.0–10.5)
nRBC: 0 % (ref 0.0–0.2)

## 2020-06-03 LAB — HEPARIN LEVEL (UNFRACTIONATED)
Heparin Unfractionated: 0.35 IU/mL (ref 0.30–0.70)
Heparin Unfractionated: 0.32 IU/mL (ref 0.30–0.70)

## 2020-06-03 LAB — PHOSPHORUS: Phosphorus: 4.1 mg/dL (ref 2.5–4.6)

## 2020-06-03 LAB — CALCIUM, IONIZED: Calcium, Ionized, Serum: 5.1 mg/dL (ref 4.5–5.6)

## 2020-06-03 LAB — MAGNESIUM: Magnesium: 1.9 mg/dL (ref 1.7–2.4)

## 2020-06-03 MED ORDER — APIXABAN 5 MG PO TABS: 5.0000 mg | ORAL_TABLET | Freq: Two times a day (BID) | ORAL | Status: DC

## 2020-06-03 MED ORDER — OXYCODONE-ACETAMINOPHEN 5-325 MG PO TABS: 1.0000 | ORAL_TABLET | ORAL | 0 refills | Status: AC | PRN

## 2020-06-03 MED ORDER — APIXABAN 5 MG PO TABS: 5.0000 mg | ORAL_TABLET | Freq: Two times a day (BID) | ORAL | 0 refills | Status: DC

## 2020-06-03 MED ORDER — NICOTINE 21 MG/24HR TD PT24
21.0000 mg | MEDICATED_PATCH | TRANSDERMAL | Status: DC
  Administered 2020-06-03: 21 mg via TRANSDERMAL
  Filled 2020-06-03: qty 1

## 2020-06-03 MED ORDER — APIXABAN 5 MG PO TABS
10.0000 mg | ORAL_TABLET | Freq: Two times a day (BID) | ORAL | Status: DC
  Administered 2020-06-03: 10 mg via ORAL
  Filled 2020-06-03: qty 2

## 2020-06-03 MED ORDER — APIXABAN 5 MG PO TABS: 10.0000 mg | ORAL_TABLET | Freq: Two times a day (BID) | ORAL | 0 refills | Status: DC

## 2020-06-03 NOTE — Progress Notes (Signed)
Mobility Specialist - Progress Note   06/03/20 1400  Mobility  Activity Ambulated in hall  Level of Assistance Standby assist, set-up cues, supervision of patient - no hands on  Assistive Device None  Distance Ambulated (ft) 500 ft  Mobility Ambulated with assistance in hallway  Mobility Response Tolerated well  Mobility performed by Mobility specialist  $Mobility charge 1 Mobility    During mobility: 89 HR, 99% SpO2   Pt ambulated in hallway without AD. No LOB. HR 80-90 bpm. O2 high 90s on RA. Mildly unsteady. Pt attempting to leave mid-session, security called and assisted pt back to room. Pt back in bed with alarm set.    Filiberto Pinks Mobility Specialist 06/03/20, 2:48 PM

## 2020-06-03 NOTE — Consult Note (Signed)
ANTICOAGULATION CONSULT NOTE  Pharmacy Consult for IV Heparin Indication: portal vein thrombosis  Patient Measurements: Heparin Dosing Weight: 73.5 kg  Labs: Recent Labs    06/01/20 1319 06/01/20 1319 06/01/20 1941 06/02/20 0208 06/02/20 0547 06/02/20 0933 06/02/20 1546 06/03/20 0004  HGB 15.6  --   --  13.8 14.1  --   --  13.8  HCT 46.9  --   --  42.1 42.4  --   --  42.1  PLT 244  --   --  250 233  --   --  234  APTT  --   --  31  --   --   --   --   --   LABPROT  --   --  14.5  --  14.8  --   --   --   INR  --   --  1.1  --  1.2  --   --   --   HEPARINUNFRC  --    < >  --  0.19*  --  0.39 0.26* 0.35  CREATININE 0.80  --   --   --  0.72  --   --  0.59*   < > = values in this interval not displayed.    Estimated Creatinine Clearance: 84.8 mL/min (A) (by C-G formula based on SCr of 0.59 mg/dL (L)).   Medical History: Past Medical History:  Diagnosis Date  . Acute pancreatitis   . COVID-19 virus infection    positive COVID-19 test on 01/22/20  . MI (myocardial infarction) (HCC)   . Paroxysmal atrial fibrillation (HCC)     Medications:  No anticoagulation prior to admission per my chart review. Medication reconciliation is complete  Assessment: Patient is a 65 y/o M with medical history as above who presented to the ED 5/17 with back pain. Ultrasound revealed portal vein thrombosis. Pharmacy has been consulted to initiate heparin infusion for DVT treatment. H&H, platelets wnl and stable  Goal of Therapy:  Heparin level 0.3-0.7 units/ml Monitor platelets by anticoagulation protocol: Yes   Plan:   This is the second consecutive anti-Xa level: continue the heparin infusion at 1550 units/hr.    Recheck anti-Xa level in am 5/20  CBC daily while on heparin  Burnis Medin, PharmD 06/03/2020 7:07 AM

## 2020-06-03 NOTE — Progress Notes (Signed)
Patient discharged to home with all belongings accompanied by son.  Discharge instructions and Medications reviewed with patient and family.  All questions answered.  PIV x 2 removed, no bleeding, intact.  Patient is being discharged now per patient request.

## 2020-06-03 NOTE — Discharge Summary (Signed)
Physician Discharge Summary  Thomas Coffey:[id number] DOB: 08/22/1955 DOA: 06/01/2020  PCP: Oswaldo Conroy, MD  Admit date: 06/01/2020 Discharge date: 06/03/2020  Admitted From: home Disposition:  home  Recommendations for Outpatient Follow-up:  1. Follow up with PCP in 1 week  Home Health: no  Equipment/Devices:  Discharge Condition: stable  CODE STATUS: full  Diet recommendation: Heart Healthy   Brief/Interim Summary: HPI was taken from Dr. Arlean Hopping: Thomas Coffey is a 65 y.o. male with medical history significant for paroxysmal atrial fibrillation not chronically anticoagulated, coronary artery disease status post MI, chronic tobacco abuse, chronic alcohol abuse, acute pancreatitis, chronic transaminitis, who is admitted to [facility] on 06/01/2020 with nonocclusive portal vein thrombus after presenting from home to [facility] ED complaining of abdominal pain.   The patient reports that he has been experiencing approximately 2 weeks of low back pain, with radiation into the right lower extremity terminating at the level of the right foot.  He reports that this discomfort has been nearly constant over that timeframe, with exacerbation with ambulation.  Denies any preceding trauma.  He had presented to [facility] ED on 05/30/2020 for evaluation of this back pain, at which time he underwent plain films of the lumbar spine which showed moderate degenerative disease throughout the lumbar spine without evidence of associated acute fracture or subluxation.  Given the radiation of his low back pain to the right lower extremity, plain films of the right hip were also pursued, and showed no evidence of acute fracture or subluxation.  Follow-up in the outpatient orthopedic surgery clinic on 06/02/2020 was scheduled for the patient, and he was subsequently discharged home from the ED.   Following discharge from the ED on 05/30/2020, the patient reports persistence of his lower  back discomfort, continuing to note radiation of this discomfort into the right foot, but over the last 1 to 2 days has also noted development of diffuse abdominal discomfort, which she describes as constant achy pain over that time, with exacerbation in an immediately postprandial timeframe.  He denies bilateral lateral radiation of his abdominal discomfort into the back, without any radiation into the groin.  Denies any associated nausea or vomiting.  He also denies any recent diarrhea, melena, hematochezia.  No recent traveling.  He acknowledges a history of acute pancreatitis a few years ago, and reports that his presenting abdominal pain feels similar in distribution and quality relative to that which he was experiencing at the time of his episode of acute pancreatitis.  Denies any associated subjective fever, chills, rigors.  Denies any associated neck stiffness, rhinitis, rhinorrhea, sore throat, shortness breath, wheezing, cough.  He acknowledges that he was diagnosed with COVID-19 infection during the first week in January 2022, but denies any known recent COVID-19 exposures.  Denies any acute urinary symptoms, including no recent dysuria, gross hematuria, or change in urinary urgency/frequency.  He denies any recent chest pain, diaphoresis, palpitations, dizziness, presyncope, or syncope.  No recent trauma.   Denies any recent lower extremity edema, lower extremity erythema, calf tenderness, hemoptysis.  No known history of underlying malignancy.  Denies any periods of recent prolonged diminished ambulatory activity.  No personal history of prior DVT/PE  Medical history notable for paroxysmal atrial fibrillation.  The patient knowledges that he is not on chronic anticoagulation, and is also not on aspirin at home.  Specifically, denies taking any blood thinners whatsoever at home, including no aspirin, Plavix, Xarelto, Pradaxa, Eliquis, warfarin.  He acknowledges that he  was previously prescribed  Lopressor for his history of paroxysmal atrial fibrillation, but acknowledges that he no longer takes that medication.  Not currently on any AV nodal blocking agents at home.  Acknowledges typically consumes a sixpack of twelve oz beer per day, and has been drinking at this volume/frequency for a few years now.  He reports most recent alcohol consumption occurred earlier today.  Denies any history of alcohol withdrawal symptoms or seizures  Hospital course from Dr. Mayford KnifeWilliams 5/18-5/19/22: Pt was found to have nonocclusive portal vein thrombosis and was started on IV heparin drip. Pt was then converted to po eliquis prior to d/c. Pt will need to be on eliquis for at least 3 months. Also, pt was treated for chronic alcohol abuse and started on CIWA protocol. Pt also received alcohol cessation counseling. Of note, pt was caught smoking cigarettes in his room. Pt received smoking cessation counseling as well. For more information, please see previous progress/consult notes.   Discharge Diagnoses:  Principal Problem:   Portal vein thrombosis Active Problems:   Alcohol abuse   Tobacco dependence syndrome   Abdominal pain   Transaminitis   Leukocytosis   AF (paroxysmal atrial fibrillation) (HCC)    Nonocclusive portal vein thrombosis: as per US of abd. D/c IV heparin drip and start eliquis   Degenerative disc disease of L spine: percocet prn for pain.   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 Continue on CIWA protocol  Leukocytosis: likely reactive. Will continue to monitor   PAF: continue on metoprolol. Not on anticoagulation as per med rec   Smoker: smokes 1 ppd x at least 30 years. Smoking cessation counseling. Nicotine patch to prevent w/drawal    Discharge Instructions  Discharge Instructions    Diet - low sodium heart healthy   Complete by: As directed    Discharge instructions    Complete by: As directed    F/u w/ PCP in 1 week to f/u on portal vein thrombosis   Increase activity slowly   Complete by: As directed      Allergies as of 06/03/2020   No Known Allergies     Medication List    STOP taking these medications   aspirin 81 MG EC tablet     TAKE these medications   acetaminophen 325 MG tablet Commonly known as: TYLENOL Take 2 tablets (650 mg total) by mouth every 6 (six) hours as needed for mild pain or fever.   albuterol 108 (90 Base) MCG/ACT inhaler Commonly known as: VENTOLIN HFA Inhale 2 puffs into the lungs every 4 (four) hours as needed for wheezing or shortness of breath.   apixaban 5 MG Tabs tablet Commonly known as: ELIQUIS Take 2 tablets (10 mg total) by mouth 2 (two) times daily for 7 days.   apixaban 5 MG Tabs tablet Commonly known as: ELIQUIS Take 1 tablet (5 mg total) by mouth 2 (two) times daily. Only start taking this script after you have completed 10mg  BID x 7 days Start taking on: Jun 10, 2020   magnesium oxide 400 (241.3 Mg) MG tablet Commonly known as: MAG-OX Take 1 tablet (400 mg total) by mouth 2 (two) times daily.   methocarbamol 500 MG tablet Commonly known as: ROBAXIN Take 1 tablet (500 mg total) by mouth every 6 (six) hours as needed for muscle spasms.   methylPREDNISolone 4 MG Tbpk tablet Commonly known as: MEDROL DOSEPAK Take Tapered dose as directed  metoprolol tartrate 25 MG tablet Commonly known as: LOPRESSOR Take 1 tablet (25 mg total) by mouth 2 (two) times daily.   multivitamin with minerals Tabs tablet Take 1 tablet by mouth daily.   oxyCODONE-acetaminophen 5-325 MG tablet Commonly known as: Percocet Take 1 tablet by mouth every 4 (four) hours as needed for up to 3 days for severe pain.   potassium chloride 20 MEQ packet Commonly known as: KLOR-CON Take 20 mEq by mouth daily.   predniSONE 20 MG tablet Commonly known as: DELTASONE Take by mouth.       No Known  Allergies  Consultations:     Procedures/Studies: DG Lumbar Spine 2-3 Views  Result Date: 05/30/2020 CLINICAL DATA:  Radicular pain to RIGHT LOWER extremity. EXAM: LUMBAR SPINE - 2-3 VIEW COMPARISON:  07/18/2005 FINDINGS: Normal alignment. Moderate degenerative changes throughout the lumbar spine. Moderate facet hypertrophy in the LOWER lumbar levels. There is no acute fracture or subluxation. No lytic or blastic lesions. IMPRESSION: Moderate degenerative changes.  No evidence for acute  abnormality. Electronically Signed   By: Norva Pavlov M.D.   On: 05/30/2020 11:36   US Abdomen Complete  Result Date: 06/01/2020 CLINICAL DATA:  Mid abdominal pain. EXAM: ABDOMEN ULTRASOUND COMPLETE COMPARISON:  February 03, 2020. FINDINGS: Gallbladder: Multiple shadowing echogenic gallstones are seen within the gallbladder lumen. The largest measures approximately 12 mm. There is no evidence of gallbladder wall thickening (2.3 mm). No sonographic Murphy sign noted by sonographer. Common bile duct: Diameter: Poorly visualized. Liver: An 8.1 cm x 5.4 cm x 5.1 cm echogenic area is noted within the liver. Diffusely increased echogenicity of the liver parenchyma is noted. Nonocclusive thrombus is seen within the main portal vein and extends to the right portal vein. This represents a new finding when compared to the prior study. Normal direction of blood flow towards the liver. IVC: No abnormality visualized. Pancreas: Visualized portion unremarkable. Spleen: Size (8.3 cm) and appearance within normal limits. Right Kidney: Length: 12.8 cm. Echogenicity within normal limits. No mass or hydronephrosis visualized. Left Kidney: Length: 10.8 cm. Echogenicity within normal limits. No mass or hydronephrosis visualized. Abdominal aorta: No aneurysm visualized (2.5 cm AP diameter). Other findings: None. IMPRESSION: 1. Nonocclusive thrombus within the main portal vein and right portal vein, as described above. 2. Cholelithiasis.  3. Echogenic area within the right lobe of the liver which likely represents an area of fatty infiltration. Electronically Signed   By: Aram Candela M.D.   On: 06/01/2020 18:11   DG Hip Unilat W or Wo Pelvis 2-3 Views Right  Result Date: 05/30/2020 CLINICAL DATA:  Radicular pain to the RIGHT LOWER extremity. Symptoms for 3 weeks. EXAM: DG HIP (WITH OR WITHOUT PELVIS) 2-3V RIGHT COMPARISON:  None. FINDINGS: There is no acute fracture or subluxation. Degenerative changes are noted in the lumbar spine. IMPRESSION: No evidence for acute  abnormality. Electronically Signed   By: Norva Pavlov M.D.   On: 05/30/2020 11:39       Subjective: Pt currently denies any complaints and wants to be d/c home.    Discharge Exam: Vitals:   06/03/20 0853 06/03/20 1105  BP: (!) 147/93 129/76  Pulse: 72 77  Resp: 16 17  Temp: 98.7 F (37.1 C) 97.9 F (36.6 C)  SpO2: 100% 98%   Vitals:   06/03/20 0345 06/03/20 0352 06/03/20 0853 06/03/20 1105  BP:  131/84 (!) 147/93 129/76  Pulse:  75 72 77  Resp:  16 16 17   Temp:  (!) 97.4 F (  36.3 C) 98.7 F (37.1 C) 97.9 F (36.6 C)  TempSrc:   Oral   SpO2:  97% 100% 98%  Weight: 64.3 kg     Height:        General: Pt is alert, awake. Appears disheveled  Cardiovascular: S1/S2 +, no rubs, no gallops Respiratory: CTA bilaterally, no wheezing, no rhonchi Abdominal: Soft, NT, ND, bowel sounds + Extremities:  no cyanosis    The results of significant diagnostics from this hospitalization (including imaging, microbiology, ancillary and laboratory) are listed below for reference.     Microbiology: Recent Results (from the past 240 hour(s))  Resp Panel by RT-PCR (Flu A&B, Covid) Nasopharyngeal Swab     Status: None   Collection Time: 06/01/20  7:04 PM   Specimen: Nasopharyngeal Swab; Nasopharyngeal(NP) swabs in vial transport medium  Result Value Ref Range Status   SARS Coronavirus 2 by RT PCR NEGATIVE NEGATIVE Final    Comment:  (NOTE) SARS-CoV-2 target nucleic acids are NOT DETECTED.  The SARS-CoV-2 RNA is generally detectable in upper respiratory specimens during the acute phase of infection. The lowest concentration of SARS-CoV-2 viral copies this assay can detect is 138 copies/mL. A negative result does not preclude SARS-Cov-2 infection and should not be used as the sole basis for treatment or other patient management decisions. A negative result may occur with  improper specimen collection/handling, submission of specimen other than nasopharyngeal swab, presence of viral mutation(s) within the areas targeted by this assay, and inadequate number of viral copies(<138 copies/mL). A negative result must be combined with clinical observations, patient history, and epidemiological information. The expected result is Negative.  Fact Sheet for Patients:  BloggerCourse.com  Fact Sheet for Healthcare Providers:  SeriousBroker.it  This test is no t yet approved or cleared by the [place] FDA and  has been authorized for detection and/or diagnosis of SARS-CoV-2 by FDA under an Emergency Use Authorization (EUA). This EUA will remain  in effect (meaning this test can be used) for the duration of the COVID-19 declaration under Section 564(b)(1) of the Act, 21 U.S.C.section 360bbb-3(b)(1), unless the authorization is terminated  or revoked sooner.       Influenza A by PCR NEGATIVE NEGATIVE Final   Influenza B by PCR NEGATIVE NEGATIVE Final    Comment: (NOTE) The Xpert Xpress SARS-CoV-2/FLU/RSV plus assay is intended as an aid in the diagnosis of influenza from Nasopharyngeal swab specimens and should not be used as a sole basis for treatment. Nasal washings and aspirates are unacceptable for Xpert Xpress SARS-CoV-2/FLU/RSV testing.  Fact Sheet for Patients: BloggerCourse.com  Fact Sheet for Healthcare  Providers: SeriousBroker.it  This test is not yet approved or cleared by the [place] FDA and has been authorized for detection and/or diagnosis of SARS-CoV-2 by FDA under an Emergency Use Authorization (EUA). This EUA will remain in effect (meaning this test can be used) for the duration of the COVID-19 declaration under Section 564(b)(1) of the Act, 21 U.S.C. section 360bbb-3(b)(1), unless the authorization is terminated or revoked.  Performed at [facility], [address] [place], [place] [address]      Labs: BNP (last 3 results) Recent Labs    01/28/20 1310  BNP 117.5*   Basic Metabolic Panel: Recent Labs  Lab 06/01/20 1319 06/02/20 0547 06/03/20 0004  NA 136 136 137  K 3.8 3.6 3.8  CL 103 104 105  CO2 24 23 24   GLUCOSE 108* 148* 134*  BUN 12 13 11   CREATININE 0.80 0.72 0.59*  CALCIUM 9.6 9.1 9.2  MG  --  1.6* 1.9  PHOS  --  4.3 4.1   Liver Function Tests: Recent Labs  Lab 06/01/20 1319 06/02/20 0547 06/03/20 0004  AST 83* 59* 63*  ALT 56* 41 44  ALKPHOS 160* 144* 138*  BILITOT 1.0 0.9 0.9  PROT 7.8 6.8 6.7  ALBUMIN 3.6 3.1* 3.1*   Recent Labs  Lab 06/01/20 1319  LIPASE 58*   No results for input(s): AMMONIA in the last 168 hours. CBC: Recent Labs  Lab 06/01/20 1319 06/02/20 0208 06/02/20 0547 06/03/20 0004  WBC 15.2* 12.8* 13.9* 11.2*  NEUTROABS 12.2*  --   --   --   HGB 15.6 13.8 14.1 13.8  HCT 46.9 42.1 42.4 42.1  MCV 89.7 89.2 89.3 89.8  PLT 244 250 233 234   Cardiac Enzymes: No results for input(s): CKTOTAL, CKMB, CKMBINDEX, TROPONINI in the last 168 hours. BNP: Invalid input(s): POCBNP CBG: No results for input(s): GLUCAP in the last 168 hours. D-Dimer No results for input(s): DDIMER in the last 72 hours. Hgb A1c No results for input(s): HGBA1C in the last 72 hours. Lipid Profile No results for input(s): CHOL, HDL, LDLCALC, TRIG, CHOLHDL, LDLDIRECT in the last 72 hours. Thyroid  function studies No results for input(s): TSH, T4TOTAL, T3FREE, THYROIDAB in the last 72 hours.  Invalid input(s): FREET3 Anemia work up No results for input(s): VITAMINB12, FOLATE, FERRITIN, TIBC, IRON, RETICCTPCT in the last 72 hours. Urinalysis    Component Value Date/Time   COLORURINE YELLOW (A) 06/02/2020 2045   APPEARANCEUR HAZY (A) 06/02/2020 2045   LABSPEC 1.018 06/02/2020 2045   PHURINE 5.0 06/02/2020 2045   GLUCOSEU NEGATIVE 06/02/2020 2045   HGBUR NEGATIVE 06/02/2020 2045   BILIRUBINUR NEGATIVE 06/02/2020 2045   KETONESUR NEGATIVE 06/02/2020 2045   PROTEINUR NEGATIVE 06/02/2020 2045   NITRITE NEGATIVE 06/02/2020 2045   LEUKOCYTESUR LARGE (A) 06/02/2020 2045   Sepsis Labs Invalid input(s): PROCALCITONIN,  WBC,  LACTICIDVEN Microbiology Recent Results (from the past 240 hour(s))  Resp Panel by RT-PCR (Flu A&B, Covid) Nasopharyngeal Swab     Status: None   Collection Time: 06/01/20  7:04 PM   Specimen: Nasopharyngeal Swab; Nasopharyngeal(NP) swabs in vial transport medium  Result Value Ref Range Status   SARS Coronavirus 2 by RT PCR NEGATIVE NEGATIVE Final    Comment: (NOTE) SARS-CoV-2 target nucleic acids are NOT DETECTED.  The SARS-CoV-2 RNA is generally detectable in upper respiratory specimens during the acute phase of infection. The lowest concentration of SARS-CoV-2 viral copies this assay can detect is 138 copies/mL. A negative result does not preclude SARS-Cov-2 infection and should not be used as the sole basis for treatment or other patient management decisions. A negative result may occur with  improper specimen collection/handling, submission of specimen other than nasopharyngeal swab, presence of viral mutation(s) within the areas targeted by this assay, and inadequate number of viral copies(<138 copies/mL). A negative result must be combined with clinical observations, patient history, and epidemiological information. The expected result is  Negative.  Fact Sheet for Patients:  BloggerCourse.com  Fact Sheet for Healthcare Providers:  SeriousBroker.it  This test is no t yet approved or cleared by the [place] FDA and  has been authorized for detection and/or diagnosis of SARS-CoV-2 by FDA under an Emergency Use Authorization (EUA). This EUA will remain  in effect (meaning this test can be used) for the duration of the COVID-19 declaration under Section 564(b)(1) of the Act, 21 U.S.C.section 360bbb-3(b)(1), unless  the authorization is terminated  or revoked sooner.       Influenza A by PCR NEGATIVE NEGATIVE Final   Influenza B by PCR NEGATIVE NEGATIVE Final    Comment: (NOTE) The Xpert Xpress SARS-CoV-2/FLU/RSV plus assay is intended as an aid in the diagnosis of influenza from Nasopharyngeal swab specimens and should not be used as a sole basis for treatment. Nasal washings and aspirates are unacceptable for Xpert Xpress SARS-CoV-2/FLU/RSV testing.  Fact Sheet for Patients: BloggerCourse.com  Fact Sheet for Healthcare Providers: SeriousBroker.it  This test is not yet approved or cleared by the [place] FDA and has been authorized for detection and/or diagnosis of SARS-CoV-2 by FDA under an Emergency Use Authorization (EUA). This EUA will remain in effect (meaning this test can be used) for the duration of the COVID-19 declaration under Section 564(b)(1) of the Act, 21 U.S.C. section 360bbb-3(b)(1), unless the authorization is terminated or revoked.  Performed at [facility], [address], [place], [place] [address]      Time coordinating discharge: Over 30 minutes  SIGNED:   Charise Killian, MD  [facility] 06/03/2020, 3:37 PM Pager   If 7PM-7AM, please contact night-coverage

## 2020-06-03 NOTE — Plan of Care (Signed)
  Problem: Education: Goal: Knowledge of General Education information will improve Description: Including pain rating scale, medication(s)/side effects and non-pharmacologic comfort measures 06/03/2020 1541 by Orvan Seen, RN Outcome: Completed/Met 06/03/2020 1258 by Orvan Seen, RN Outcome: Progressing   Problem: Health Behavior/Discharge Planning: Goal: Ability to manage health-related needs will improve 06/03/2020 1541 by Orvan Seen, RN Outcome: Completed/Met 06/03/2020 1258 by Orvan Seen, RN Outcome: Progressing   Problem: Clinical Measurements: Goal: Ability to maintain clinical measurements within normal limits will improve 06/03/2020 1541 by Orvan Seen, RN Outcome: Completed/Met 06/03/2020 1258 by Orvan Seen, RN Outcome: Progressing Goal: Will remain free from infection 06/03/2020 1541 by Orvan Seen, RN Outcome: Completed/Met 06/03/2020 1258 by Orvan Seen, RN Outcome: Progressing Goal: Diagnostic test results will improve 06/03/2020 1541 by Orvan Seen, RN Outcome: Completed/Met 06/03/2020 1258 by Orvan Seen, RN Outcome: Progressing Goal: Respiratory complications will improve 06/03/2020 1541 by Orvan Seen, RN Outcome: Completed/Met 06/03/2020 1258 by Orvan Seen, RN Outcome: Progressing Goal: Cardiovascular complication will be avoided 06/03/2020 1541 by Orvan Seen, RN Outcome: Completed/Met 06/03/2020 1258 by Orvan Seen, RN Outcome: Progressing   Problem: Activity: Goal: Risk for activity intolerance will decrease 06/03/2020 1541 by Orvan Seen, RN Outcome: Completed/Met 06/03/2020 1258 by Orvan Seen, RN Outcome: Progressing   Problem: Nutrition: Goal: Adequate nutrition will be maintained 06/03/2020 1541 by Orvan Seen, RN Outcome: Completed/Met 06/03/2020 1258 by Orvan Seen, RN Outcome: Progressing   Problem: Coping: Goal: Level of anxiety will decrease 06/03/2020  1541 by Orvan Seen, RN Outcome: Completed/Met 06/03/2020 1258 by Orvan Seen, RN Outcome: Progressing   Problem: Elimination: Goal: Will not experience complications related to bowel motility 06/03/2020 1541 by Orvan Seen, RN Outcome: Completed/Met 06/03/2020 1258 by Orvan Seen, RN Outcome: Progressing Goal: Will not experience complications related to urinary retention 06/03/2020 1541 by Orvan Seen, RN Outcome: Completed/Met 06/03/2020 1258 by Orvan Seen, RN Outcome: Progressing   Problem: Pain Managment: Goal: General experience of comfort will improve 06/03/2020 1541 by Orvan Seen, RN Outcome: Completed/Met 06/03/2020 1258 by Orvan Seen, RN Outcome: Progressing   Problem: Safety: Goal: Ability to remain free from injury will improve 06/03/2020 1541 by Orvan Seen, RN Outcome: Completed/Met 06/03/2020 1258 by Orvan Seen, RN Outcome: Progressing   Problem: Skin Integrity: Goal: Risk for impaired skin integrity will decrease 06/03/2020 1541 by Orvan Seen, RN Outcome: Completed/Met 06/03/2020 1258 by Orvan Seen, RN Outcome: Progressing

## 2020-06-03 NOTE — Plan of Care (Signed)

## 2020-06-03 NOTE — Progress Notes (Signed)
PHARMACY CONSULT NOTE  Pharmacy Consult for Electrolyte Monitoring and Replacement   Recent Labs: Potassium (mmol/L)  Date Value  06/03/2020 3.8   Magnesium (mg/dL)  Date Value  42/87/6811 1.9   Calcium (mg/dL)  Date Value  57/26/2035 9.2   Albumin (g/dL)  Date Value  59/74/1638 3.1 (L)   Phosphorus (mg/dL)  Date Value  45/36/4680 4.1   Sodium (mmol/L)  Date Value  06/03/2020 137     Assessment: 65 year old male who is admitted with nonocclusive portal vein thrombus after presenting from home to [facility] ED for generalized abdominal discomfort, that was preceded by right lower back pain.  Imaging reveals nonocclusive portal vein thrombus. Pharmacy was asked to follow. Given history of EtOH abuse there is the likelihood of re-feeding syndrome.   Goal of Therapy:  Electrolytes WNL  Plan:   No electrolyte replacement warranted for today  Re-check electrolytes in am  Lowella Bandy ,PharmD Clinical Pharmacist 06/03/2020 7:06 AM

## 2020-06-03 NOTE — TOC Progression Note (Signed)
Transition of Care [facility]) - Progression Note    Patient Details  Name: Thomas Coffey MRN: [id number] Date of Birth: May 22, 1955  Transition of Care [facility]) CM/SW Contact  Margarito Liner, LCSW Phone Number: 06/03/2020, 11:46 AM  Clinical Narrative:  Per MD, likely discharge tomorrow on new Eliquis. 30-day free trial card put on front of chart to go home with him. Sent secure chat to RN and pharmacist to notify.   Expected Discharge Plan: Home/Self Care Barriers to Discharge: Continued Medical Work up  Expected Discharge Plan and Services Expected Discharge Plan: Home/Self Care     Post Acute Care Choice: NA Living arrangements for the past 2 months: Single Family Home                                       Social Determinants of Health (SDOH) Interventions    Readmission Risk Interventions No flowsheet data found.

## 2020-06-03 NOTE — Consult Note (Signed)
ANTICOAGULATION CONSULT NOTE  Pharmacy Consult for IV Heparin Indication: portal vein thrombosis  Patient Measurements: Heparin Dosing Weight: 73.5 kg  Labs: Recent Labs    06/01/20 1319 06/01/20 1319 06/01/20 1941 06/02/20 0208 06/02/20 0547 06/02/20 0933 06/02/20 1546 06/03/20 0004  HGB 15.6  --   --  13.8 14.1  --   --  13.8  HCT 46.9  --   --  42.1 42.4  --   --  42.1  PLT 244  --   --  250 233  --   --  234  APTT  --   --  31  --   --   --   --   --   LABPROT  --   --  14.5  --  14.8  --   --   --   INR  --   --  1.1  --  1.2  --   --   --   HEPARINUNFRC  --    < >  --  0.19*  --  0.39 0.26* 0.35  CREATININE 0.80  --   --   --  0.72  --   --  0.59*   < > = values in this interval not displayed.    Estimated Creatinine Clearance: 84.7 mL/min (A) (by C-G formula based on SCr of 0.59 mg/dL (L)).   Medical History: Past Medical History:  Diagnosis Date  . Acute pancreatitis   . COVID-19 virus infection    positive COVID-19 test on 01/22/20  . MI (myocardial infarction) (HCC)   . Paroxysmal atrial fibrillation (HCC)     Medications:  No anticoagulation prior to admission per my chart review. Medication reconciliation is complete  Assessment: Patient is a 65 y/o M with medical history as above who presented to the ED 5/17 with back pain. Ultrasound revealed portal vein thrombosis. Pharmacy has been consulted to initiate heparin infusion for DVT treatment. H&H, platelets wnl and stable  5/18 0933 HL 0.39  5/18 1546 HL 0.26 5/19 0004 HL 0.35, therapeutic  Goal of Therapy:  Heparin level 0.3-0.7 units/ml Monitor platelets by anticoagulation protocol: Yes   Plan:   Will continue the heparin infusion at 1550 units/hr.    Recheck HL in ~6 hours to confirm  CBC daily while on heparin.  Otelia Sergeant, PharmD, [facility] 06/03/2020 1:23 AM

## 2020-06-03 NOTE — Progress Notes (Signed)
Patient started pacing out of his room in the hallways disconnecting himself from monitor since noon trying to smoke a cigarette and asking for the doctor to let him go home.  Patient on CIWA protocol.  Security called to room twice to help redirect patient back to room.  Total of 8 mg of Ativan administered since 0700.

## 2020-06-03 NOTE — Progress Notes (Signed)
PROGRESS NOTE    Thomas Coffey  TMH:[id number] DOB: 04-04-1955 DOA: 06/01/2020 PCP: Oswaldo Conroy, MD    Assessment & Plan:   Principal Problem:   Portal vein thrombosis Active Problems:   Alcohol abuse   Tobacco dependence syndrome   Abdominal pain   Transaminitis   Leukocytosis   AF (paroxysmal atrial fibrillation) (HCC)   Nonocclusive portal vein thrombosis: as per [place] of abd. D/c IV heparin drip and start eliquis   Degenerative disc disease of L spine: percocet prn for pain.   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 Continue on CIWA protocol  Leukocytosis: likely reactive. Will continue to monitor   PAF: continue on metoprolol. Not on anticoagulation as per med rec   Smoker: smokes 1 ppd x at least 30 years. Smoking cessation counseling. Nicotine patch to prevent w/drawal        DVT prophylaxis: eliquis  Code Status: full  Family Communication:  Disposition Plan: likely d/c back home   Level of care: Med-Surg   Status is: Inpatient  Remains inpatient appropriate because:IV treatments appropriate due to intensity of illness or inability to take PO and Inpatient level of care appropriate due to severity of illness   Dispo: The patient is from: Home              Anticipated d/c is to: Home              Patient currently is not medically stable to d/c.   Difficult to place patient Yes        Consultants:      Procedures:    Antimicrobials:   Subjective: Pt c/o intermittent abd pain, slightly improved from day prior.   Objective: Vitals:   06/02/20 1622 06/02/20 2017 06/03/20 0345 06/03/20 0352  BP: (!) 154/86 (!) 147/90  131/84  Pulse: 80 76  75  Resp:  16  16  Temp:  97.7 F (36.5 C)  (!) 97.4 F (36.3 C)  TempSrc:  Oral    SpO2:  100%  97%  Weight:   64.3 kg   Height:        Intake/Output Summary (Last 24 hours) at 06/03/2020  0737 Last data filed at 06/03/2020 0348 Gross per 24 hour  Intake 747.34 ml  Output 575 ml  Net 172.34 ml   Filed Weights   06/01/20 1257 06/02/20 0511 06/03/20 0345  Weight: 73.5 kg 64.2 kg 64.3 kg    Examination:  General exam: Appears disheveled. Appears comfortable   Respiratory system:  Clear breath sounds b/l  Cardiovascular system: S1/S2+. No rubs or clicks   Gastrointestinal system: Abd is soft, ND, mild tenderness to palpation & hypoactive bowel sounds Central nervous system: alert and oriented. Moves all extremities  Psychiatry: judgement and insight appear normal. Flat mood and affect    Data Reviewed: I have personally reviewed following labs and imaging studies  CBC: Recent Labs  Lab 06/01/20 1319 06/02/20 0208 06/02/20 0547 06/03/20 0004  WBC 15.2* 12.8* 13.9* 11.2*  NEUTROABS 12.2*  --   --   --   HGB 15.6 13.8 14.1 13.8  HCT 46.9 42.1 42.4 42.1  MCV 89.7 89.2 89.3 89.8  PLT 244 250 233 234   Basic Metabolic Panel: Recent Labs  Lab 06/01/20 1319 06/02/20 0547 06/03/20 0004  NA 136 136 137  K 3.8 3.6 3.8  CL 103 104 105  CO2 24 23 24   GLUCOSE  108* 148* 134*  BUN 12 13 11   CREATININE 0.80 0.72 0.59*  CALCIUM 9.6 9.1 9.2  MG  --  1.6* 1.9  PHOS  --  4.3 4.1   GFR: Estimated Creatinine Clearance: 84.8 mL/min (A) (by C-G formula based on SCr of 0.59 mg/dL (L)). Liver Function Tests: Recent Labs  Lab 06/01/20 1319 06/02/20 0547 06/03/20 0004  AST 83* 59* 63*  ALT 56* 41 44  ALKPHOS 160* 144* 138*  BILITOT 1.0 0.9 0.9  PROT 7.8 6.8 6.7  ALBUMIN 3.6 3.1* 3.1*   Recent Labs  Lab 06/01/20 1319  LIPASE 58*   No results for input(s): AMMONIA in the last 168 hours. Coagulation Profile: Recent Labs  Lab 06/01/20 1941 06/02/20 0547  INR 1.1 1.2   Cardiac Enzymes: No results for input(s): CKTOTAL, CKMB, CKMBINDEX, TROPONINI in the last 168 hours. BNP (last 3 results) No results for input(s): PROBNP in the last 8760  hours. HbA1C: No results for input(s): HGBA1C in the last 72 hours. CBG: No results for input(s): GLUCAP in the last 168 hours. Lipid Profile: No results for input(s): CHOL, HDL, LDLCALC, TRIG, CHOLHDL, LDLDIRECT in the last 72 hours. Thyroid Function Tests: No results for input(s): TSH, T4TOTAL, FREET4, T3FREE, THYROIDAB in the last 72 hours. Anemia Panel: No results for input(s): VITAMINB12, FOLATE, FERRITIN, TIBC, IRON, RETICCTPCT in the last 72 hours. Sepsis Labs: No results for input(s): PROCALCITON, LATICACIDVEN in the last 168 hours.  Recent Results (from the past 240 hour(s))  Resp Panel by RT-PCR (Flu A&B, Covid) Nasopharyngeal Swab     Status: None   Collection Time: 06/01/20  7:04 PM   Specimen: Nasopharyngeal Swab; Nasopharyngeal(NP) swabs in vial transport medium  Result Value Ref Range Status   SARS Coronavirus 2 by RT PCR NEGATIVE NEGATIVE Final    Comment: (NOTE) SARS-CoV-2 target nucleic acids are NOT DETECTED.  The SARS-CoV-2 RNA is generally detectable in upper respiratory specimens during the acute phase of infection. The lowest concentration of SARS-CoV-2 viral copies this assay can detect is 138 copies/mL. A negative result does not preclude SARS-Cov-2 infection and should not be used as the sole basis for treatment or other patient management decisions. A negative result may occur with  improper specimen collection/handling, submission of specimen other than nasopharyngeal swab, presence of viral mutation(s) within the areas targeted by this assay, and inadequate number of viral copies(<138 copies/mL). A negative result must be combined with clinical observations, patient history, and epidemiological information. The expected result is Negative.  Fact Sheet for Patients:  06/03/20  Fact Sheet for Healthcare Providers:  BloggerCourse.com  This test is no t yet approved or cleared by the [employer] FDA and  has been authorized for detection and/or diagnosis of SARS-CoV-2 by FDA under an Emergency Use Authorization (EUA). This EUA will remain  in effect (meaning this test can be used) for the duration of the COVID-19 declaration under Section 564(b)(1) of the Act, 21 U.S.C.section 360bbb-3(b)(1), unless the authorization is terminated  or revoked sooner.       Influenza A by PCR NEGATIVE NEGATIVE Final   Influenza B by PCR NEGATIVE NEGATIVE Final    Comment: (NOTE) The Xpert Xpress SARS-CoV-2/FLU/RSV plus assay is intended as an aid in the diagnosis of influenza from Nasopharyngeal swab specimens and should not be used as a sole basis for treatment. Nasal washings and aspirates are unacceptable for Xpert Xpress SARS-CoV-2/FLU/RSV testing.  Fact Sheet for Patients: [place]  Fact Sheet for Healthcare Providers: BloggerCourse.com  This test is not yet approved or cleared by the [place] and has been authorized for detection and/or diagnosis of SARS-CoV-2 by FDA under an Emergency Use Authorization (EUA). This EUA will remain in effect (meaning this test can be used) for the duration of the COVID-19 declaration under Section 564(b)(1) of the Act, 21 U.S.C. section 360bbb-3(b)(1), unless the authorization is terminated or revoked.  Performed at [facility], [address], [place], [place] [address]          Radiology Studies: US Abdomen Complete  Result Date: 06/01/2020 CLINICAL DATA:  Mid abdominal pain. EXAM: ABDOMEN ULTRASOUND COMPLETE COMPARISON:  February 03, 2020. FINDINGS: Gallbladder: Multiple shadowing echogenic gallstones are seen within the gallbladder lumen. The largest measures approximately 12 mm. There is no evidence of gallbladder wall thickening (2.3 mm). No sonographic Murphy sign noted by sonographer. Common bile duct: Diameter: Poorly visualized. Liver: An 8.1 cm x 5.4  cm x 5.1 cm echogenic area is noted within the liver. Diffusely increased echogenicity of the liver parenchyma is noted. Nonocclusive thrombus is seen within the main portal vein and extends to the right portal vein. This represents a new finding when compared to the prior study. Normal direction of blood flow towards the liver. IVC: No abnormality visualized. Pancreas: Visualized portion unremarkable. Spleen: Size (8.3 cm) and appearance within normal limits. Right Kidney: Length: 12.8 cm. Echogenicity within normal limits. No mass or hydronephrosis visualized. Left Kidney: Length: 10.8 cm. Echogenicity within normal limits. No mass or hydronephrosis visualized. Abdominal aorta: No aneurysm visualized (2.5 cm AP diameter). Other findings: None. IMPRESSION: 1. Nonocclusive thrombus within the main portal vein and right portal vein, as described above. 2. Cholelithiasis. 3. Echogenic area within the right lobe of the liver which likely represents an area of fatty infiltration. Electronically Signed   By: Aram Candela M.D.   On: 06/01/2020 18:11        Scheduled Meds: . folic acid  1 mg Oral Daily  . metoprolol tartrate  25 mg Oral BID  . multivitamin-lutein  1 capsule Oral Daily  . nicotine  14 mg Transdermal Q24H  . thiamine  100 mg Oral Daily   Continuous Infusions: . heparin 1,550 Units/hr (06/03/20 0547)     LOS: 2 days    Time spent: 31 mins     Charise Killian, MD [facility] Pager [phone]  If 7PM-7AM, please contact night-coverage 06/03/2020, 7:37 AM

## 2020-07-04 ENCOUNTER — Observation Stay
Admission: EM | Admit: 2020-07-04 | Discharge: 2020-07-06 | DRG: 438 | Disposition: A | Discharge status: Home / Self Care | Payer: Medicaid Other | Attending: Internal Medicine | Admitting: Internal Medicine

## 2020-07-04 ENCOUNTER — Other Ambulatory Visit: Payer: Self-pay

## 2020-07-04 ENCOUNTER — Emergency Department: Payer: Medicaid Other

## 2020-07-04 ENCOUNTER — Encounter: Payer: Self-pay | Admitting: Emergency Medicine

## 2020-07-04 DIAGNOSIS — K8020 Calculus of gallbladder without cholecystitis without obstruction: Secondary | ICD-10-CM | POA: Diagnosis present

## 2020-07-04 DIAGNOSIS — I81 Portal vein thrombosis: Secondary | ICD-10-CM | POA: Diagnosis not present

## 2020-07-04 DIAGNOSIS — T45516A Underdosing of anticoagulants, initial encounter: Secondary | ICD-10-CM | POA: Diagnosis not present

## 2020-07-04 DIAGNOSIS — Z841 Family history of disorders of kidney and ureter: Secondary | ICD-10-CM

## 2020-07-04 DIAGNOSIS — F17210 Nicotine dependence, cigarettes, uncomplicated: Secondary | ICD-10-CM | POA: Diagnosis not present

## 2020-07-04 DIAGNOSIS — Z809 Family history of malignant neoplasm, unspecified: Secondary | ICD-10-CM

## 2020-07-04 DIAGNOSIS — Z8616 Personal history of COVID-19: Secondary | ICD-10-CM | POA: Diagnosis not present

## 2020-07-04 DIAGNOSIS — K861 Other chronic pancreatitis: Secondary | ICD-10-CM | POA: Diagnosis present

## 2020-07-04 DIAGNOSIS — F1010 Alcohol abuse, uncomplicated: Secondary | ICD-10-CM | POA: Diagnosis present

## 2020-07-04 DIAGNOSIS — I2510 Atherosclerotic heart disease of native coronary artery without angina pectoris: Secondary | ICD-10-CM | POA: Diagnosis present

## 2020-07-04 DIAGNOSIS — I480 Paroxysmal atrial fibrillation: Secondary | ICD-10-CM | POA: Diagnosis not present

## 2020-07-04 DIAGNOSIS — I252 Old myocardial infarction: Secondary | ICD-10-CM | POA: Diagnosis not present

## 2020-07-04 DIAGNOSIS — K859 Acute pancreatitis without necrosis or infection, unspecified: Secondary | ICD-10-CM | POA: Diagnosis present

## 2020-07-04 DIAGNOSIS — Z9049 Acquired absence of other specified parts of digestive tract: Secondary | ICD-10-CM | POA: Diagnosis not present

## 2020-07-04 DIAGNOSIS — R109 Unspecified abdominal pain: Secondary | ICD-10-CM

## 2020-07-04 DIAGNOSIS — Z91128 Patient's intentional underdosing of medication regimen for other reason: Secondary | ICD-10-CM

## 2020-07-04 DIAGNOSIS — K852 Alcohol induced acute pancreatitis without necrosis or infection: Principal | ICD-10-CM | POA: Diagnosis present

## 2020-07-04 LAB — CBC
HCT: 46.1 % (ref 39.0–52.0)
Hemoglobin: 14.9 g/dL (ref 13.0–17.0)
MCH: 29.4 pg (ref 26.0–34.0)
MCHC: 32.3 g/dL (ref 30.0–36.0)
MCV: 90.9 fL (ref 80.0–100.0)
Platelets: 217 10*3/uL (ref 150–400)
RBC: 5.07 MIL/uL (ref 4.22–5.81)
RDW: 14.9 % (ref 11.5–15.5)
WBC: 9 10*3/uL (ref 4.0–10.5)
nRBC: 0 % (ref 0.0–0.2)

## 2020-07-04 LAB — HEPATIC FUNCTION PANEL
ALT: 32 U/L (ref 0–44)
AST: 79 U/L — ABNORMAL HIGH (ref 15–41)
Albumin: 3.7 g/dL (ref 3.5–5.0)
Alkaline Phosphatase: 124 U/L (ref 38–126)
Bilirubin, Direct: 0.2 mg/dL (ref 0.0–0.2)
Indirect Bilirubin: 0.8 mg/dL (ref 0.3–0.9)
Total Bilirubin: 1 mg/dL (ref 0.3–1.2)
Total Protein: 7.7 g/dL (ref 6.5–8.1)

## 2020-07-04 LAB — BASIC METABOLIC PANEL
Anion gap: 8 (ref 5–15)
BUN: 9 mg/dL (ref 8–23)
CO2: 21 mmol/L — ABNORMAL LOW (ref 22–32)
Calcium: 9.2 mg/dL (ref 8.9–10.3)
Chloride: 107 mmol/L (ref 98–111)
Creatinine, Ser: 0.71 mg/dL (ref 0.61–1.24)
GFR, Estimated: >60 mL/min (ref >60)
Glucose, Bld: 119 mg/dL — ABNORMAL HIGH (ref 70–99)
Potassium: 3.8 mmol/L (ref 3.5–5.1)
Sodium: 136 mmol/L (ref 135–145)

## 2020-07-04 LAB — LIPASE, BLOOD: Lipase: 568 U/L — ABNORMAL HIGH (ref 11–51)

## 2020-07-04 LAB — TROPONIN I (HIGH SENSITIVITY): Troponin I (High Sensitivity): 4 ng/L (ref <18)

## 2020-07-04 LAB — RESP PANEL BY RT-PCR (FLU A&B, COVID) ARPGX2
Influenza A by PCR: NEGATIVE
Influenza B by PCR: NEGATIVE
SARS Coronavirus 2 by RT PCR: NEGATIVE

## 2020-07-04 LAB — CK: Total CK: 57 U/L (ref 49–397)

## 2020-07-04 MED ORDER — ONDANSETRON HCL 4 MG/2ML IJ SOLN
4.0000 mg | INTRAMUSCULAR | Status: AC
  Administered 2020-07-04: 4 mg via INTRAVENOUS
  Filled 2020-07-04: qty 2

## 2020-07-04 MED ORDER — LORAZEPAM 2 MG/ML IJ SOLN
1.0000 mg | INTRAMUSCULAR | Status: DC | PRN
Start: 2020-07-04 — End: 2020-07-06

## 2020-07-04 MED ORDER — FOLIC ACID 1 MG PO TABS
1.0000 mg | ORAL_TABLET | Freq: Every day | ORAL | Status: DC
  Administered 2020-07-04 – 2020-07-06 (×3): 1 mg via ORAL
  Filled 2020-07-04 (×3): qty 1

## 2020-07-04 MED ORDER — METOPROLOL TARTRATE 25 MG PO TABS
25.0000 mg | ORAL_TABLET | Freq: Two times a day (BID) | ORAL | Status: DC
  Administered 2020-07-05 (×3): 25 mg via ORAL
  Filled 2020-07-04 (×3): qty 1

## 2020-07-04 MED ORDER — THIAMINE HCL 100 MG PO TABS
100.0000 mg | ORAL_TABLET | Freq: Every day | ORAL | Status: DC
  Administered 2020-07-04 – 2020-07-06 (×3): 100 mg via ORAL
  Filled 2020-07-04 (×3): qty 1

## 2020-07-04 MED ORDER — HYDROMORPHONE HCL 1 MG/ML IJ SOLN
0.5000 mg | INTRAMUSCULAR | Status: DC | PRN
  Administered 2020-07-04 – 2020-07-06 (×9): 0.5 mg via INTRAVENOUS
  Filled 2020-07-04 (×9): qty 0.5

## 2020-07-04 MED ORDER — ACETAMINOPHEN 325 MG PO TABS
650.0000 mg | ORAL_TABLET | Freq: Once | ORAL | Status: AC
  Administered 2020-07-04: 650 mg via ORAL
  Filled 2020-07-04: qty 2

## 2020-07-04 MED ORDER — FAMOTIDINE 20 MG PO TABS
20.0000 mg | ORAL_TABLET | Freq: Once | ORAL | Status: DC
  Filled 2020-07-04: qty 1

## 2020-07-04 MED ORDER — THIAMINE HCL 100 MG/ML IJ SOLN
100.0000 mg | Freq: Every day | INTRAMUSCULAR | Status: DC
  Filled 2020-07-04: qty 2

## 2020-07-04 MED ORDER — SODIUM CHLORIDE 0.9 % IV BOLUS
1000.0000 mL | Freq: Once | INTRAVENOUS | Status: AC
  Administered 2020-07-04: 1000 mL via INTRAVENOUS

## 2020-07-04 MED ORDER — APIXABAN 5 MG PO TABS
5.0000 mg | ORAL_TABLET | Freq: Two times a day (BID) | ORAL | Status: DC
  Administered 2020-07-05 – 2020-07-06 (×4): 5 mg via ORAL
  Filled 2020-07-04 (×4): qty 1

## 2020-07-04 MED ORDER — MORPHINE SULFATE (PF) 4 MG/ML IV SOLN
4.0000 mg | Freq: Once | INTRAVENOUS | Status: AC
Start: 2020-07-04 — End: 2020-07-04
  Administered 2020-07-04: 4 mg via INTRAVENOUS
  Filled 2020-07-04: qty 1

## 2020-07-04 MED ORDER — MORPHINE SULFATE (PF) 2 MG/ML IV SOLN
1.0000 mg | INTRAVENOUS | Status: DC | PRN
  Administered 2020-07-04: 1 mg via INTRAVENOUS
  Filled 2020-07-04: qty 1

## 2020-07-04 MED ORDER — ADULT MULTIVITAMIN W/MINERALS CH
1.0000 | ORAL_TABLET | Freq: Every day | ORAL | Status: DC
  Administered 2020-07-04 – 2020-07-06 (×3): 1 via ORAL
  Filled 2020-07-04 (×3): qty 1

## 2020-07-04 MED ORDER — SODIUM CHLORIDE 0.9 % IV SOLN: INTRAVENOUS | Status: DC

## 2020-07-04 MED ORDER — ALUM & MAG HYDROXIDE-SIMETH 200-200-20 MG/5ML PO SUSP
15.0000 mL | Freq: Once | ORAL | Status: DC
  Filled 2020-07-04: qty 30

## 2020-07-04 MED ORDER — LORAZEPAM 1 MG PO TABS: 1.0000 mg | ORAL_TABLET | ORAL | Status: DC | PRN

## 2020-07-04 NOTE — H&P (Signed)
History and Physical    Thomas Coffey HWE:[id number] DOB: 1955-03-11 DOA: 07/04/2020  PCP: Oswaldo Conroy, MD  Patient coming from:Home  I have personally briefly reviewed patient's old medical records in [facility]  Chief Complaint: headache, body ache, and abdominal pain  HPI: Thomas Coffey is a 65 y.o. 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  Patient states that he was having a cookout yesterday and ate some chicken with spices and drink some soda and then this morning woke up with headache and diffuse body ache down to his lower abdomen.  Also has mid abdominal pain and nausea but no vomiting.  Had some diarrhea about 2 days ago.  Denies any fever and states that he always feels cold.  Has history of hemicolectomy to what he describes as likely obstruction from adhesions.  He endorsed drinking [employer] and states he had 2-3 beers yesterday.  He has not had any alcohol today.  During a recent past admission he endorsed drinking 6 to 12 packs of beer daily.  Of note, he was recently hospitalized from 06/01/2020 to 06/03/2020 and found to have nonocclusive portal vein thrombus and was discharged on Eliquis.  He reports that ever since discharge things were "shuffled around" and he has not taken any of his prescription medication.   ED Course: He was afebrile and normotensive on room air.  CBC unremarkable.  BMP unremarkable.  CK 57.  Troponin 4.  Lipase of 568. CTA abdomen and pelvis shows acute pancreatitis with hypodensity to pancreatic head.  Abdominal ultrasound with cholelithiasis without evidence of acute cholecystitis. Nonocclusive thrombus appears to still be present.  Review of Systems:   Constitutional: No Weight Change, No Fever ENT/Mouth: No sore throat, No Rhinorrhea Eyes: No Eye Pain, No Vision Changes Cardiovascular: No Chest Pain, no SOB, No PND, No Dyspnea on Exertion, No Orthopnea, No Claudication, No Edema, No Palpitations Respiratory: No Cough, No Sputum, No Wheezing, no Dyspnea  Gastrointestinal: No Nausea, No Vomiting, No Diarrhea, No Constipation, No Pain Genitourinary: no Urinary Incontinence, No Urgency, No Flank Pain Musculoskeletal: No Arthralgias, No Myalgias Skin: No Skin Lesions, No Pruritus, Neuro: no Weakness, No Numbness,  No Loss of Consciousness, No Syncope Psych: No Anxiety/Panic, No Depression, no decrease appetite Heme/Lymph: No Bruising, No Bleeding  Past Medical History:  Diagnosis Date   Acute pancreatitis    COVID-19 virus infection    positive COVID-19 test on 01/22/20   MI (myocardial infarction) (HCC)    Paroxysmal atrial fibrillation (HCC)     History reviewed. No pertinent surgical history.   reports that he has been smoking cigarettes. He has a 30.00 pack-year smoking history. He has never used smokeless tobacco. He reports current alcohol use of about 6.0 standard drinks of alcohol per week. He reports current drug use. Drug: Marijuana. Social History  No Known Allergies  Family History  Problem Relation Age of Onset   Cancer Mother    Kidney disease Brother      Prior to Admission medications   Medication Sig Start Date End Date Taking? Authorizing Provider  acetaminophen (TYLENOL) 325 MG tablet Take 2 tablets (650 mg total) by mouth every 6 (six) hours as needed for mild pain or fever. Patient not taking: Reported on 06/01/2020 11/25/18   Lorretta Harp,  MD  albuterol (VENTOLIN HFA) 108 (90 Base) MCG/ACT inhaler Inhale 2 puffs into the lungs every 4 (four) hours as needed for wheezing or shortness of breath. Patient not taking: Reported on 06/01/2020 01/22/20   Dionne Bucy, MD  apixaban (ELIQUIS) 5 MG TABS tablet Take  2 tablets (10 mg total) by mouth 2 (two) times daily for 7 days. 06/03/20 06/10/20  Charise Killian, MD  apixaban (ELIQUIS) 5 MG TABS tablet Take 1 tablet (5 mg total) by mouth 2 (two) times daily. Only start taking this script after you have completed 10mg  BID x 7 days 06/10/20 07/10/20  Charise Killian, MD  magnesium oxide (MAG-OX) 400 (241.3 Mg) MG tablet Take 1 tablet (400 mg total) by mouth 2 (two) times daily. Patient not taking: No sig reported 02/26/20   Enedina Finner, MD  methocarbamol (ROBAXIN) 500 MG tablet Take 1 tablet (500 mg total) by mouth every 6 (six) hours as needed for muscle spasms. Patient not taking: No sig reported 05/30/20   Joni Reining, PA-C  methylPREDNISolone (MEDROL DOSEPAK) 4 MG TBPK tablet Take Tapered dose as directed Patient not taking: No sig reported 05/30/20   Joni Reining, PA-C  metoprolol tartrate (LOPRESSOR) 25 MG tablet Take 1 tablet (25 mg total) by mouth 2 (two) times daily. Patient not taking: No sig reported 02/26/20   Enedina Finner, MD  Multiple Vitamin (MULTIVITAMIN WITH MINERALS) TABS tablet Take 1 tablet by mouth daily. Patient not taking: No sig reported 02/26/20   Enedina Finner, MD  potassium chloride (KLOR-CON) 20 MEQ packet Take 20 mEq by mouth daily. Patient not taking: No sig reported 02/26/20   Enedina Finner, MD  predniSONE (DELTASONE) 20 MG tablet Take by mouth. 05/20/20   [provider]    Physical Exam: Vitals:   07/04/20 1346 07/04/20 1349 07/04/20 1845  BP:  (!) 148/94 (!) 153/90  Pulse:  91 87  Resp:  18 18  Temp:  98.5 F (36.9 C)   TempSrc:  Oral   SpO2:  100% 96%  Weight: 65 kg    Height: 5\' 9"  (1.753 m)      Constitutional: NAD, calm, comfortable, elderly gentleman laying flat in bed with facial flushing Vitals:   07/04/20 1346 07/04/20 1349 07/04/20 1845  BP:  (!) 148/94 (!) 153/90  Pulse:  91 87  Resp:  18 18  Temp:  98.5 F (36.9 C)   TempSrc:  Oral   SpO2:  100% 96%  Weight: 65 kg    Height: 5\' 9"   (1.753 m)     Eyes: PERRL, lids and conjunctivae normal ENMT: Mucous membranes are moist.  Neck: normal, supple Respiratory: clear to auscultation bilaterally, no wheezing, no crackles. Normal respiratory effort. No accessory muscle use.  Cardiovascular: Regular rate and rhythm, no murmurs / rubs / gallops. No extremity edema.  Abdomen: Moderate mid epigastric tenderness, no right upper quadrant tenderness, no masses palpated. Bowel sounds positive.  Musculoskeletal: no clubbing / cyanosis. No joint deformity upper and lower extremities. Good ROM, no contractures. Normal muscle tone.  Skin: no rashes, lesions, ulcers. No induration Neurologic: CN 2-12 grossly intact. Sensation intact,  Strength 5/5 in all 4.  Psychiatric: Normal judgment and insight. Alert and oriented x 3. Normal mood.    Labs on Admission: I have personally reviewed following labs and imaging studies  CBC: Recent Labs  Lab 07/04/20 1350  WBC 9.0  HGB 14.9  HCT 46.1  MCV 90.9  PLT 217   Basic  Metabolic Panel: Recent Labs  Lab 07/04/20 1350  NA 136  K 3.8  CL 107  CO2 21*  GLUCOSE 119*  BUN 9  CREATININE 0.71  CALCIUM 9.2   GFR: Estimated Creatinine Clearance: 85.8 mL/min (by C-G formula based on SCr of 0.71 mg/dL). Liver Function Tests: Recent Labs  Lab 07/04/20 1350  AST 79*  ALT 32  ALKPHOS 124  BILITOT 1.0  PROT 7.7  ALBUMIN 3.7   Recent Labs  Lab 07/04/20 1350  LIPASE 568*   No results for input(s): AMMONIA in the last 168 hours. Coagulation Profile: No results for input(s): INR, PROTIME in the last 168 hours. Cardiac Enzymes: Recent Labs  Lab 07/04/20 1350  CKTOTAL 57   BNP (last 3 results) No results for input(s): PROBNP in the last 8760 hours. HbA1C: No results for input(s): HGBA1C in the last 72 hours. CBG: No results for input(s): GLUCAP in the last 168 hours. Lipid Profile: No results for input(s): CHOL, HDL, LDLCALC, TRIG, CHOLHDL, LDLDIRECT in the last 72  hours. Thyroid Function Tests: No results for input(s): TSH, T4TOTAL, FREET4, T3FREE, THYROIDAB in the last 72 hours. Anemia Panel: No results for input(s): VITAMINB12, FOLATE, FERRITIN, TIBC, IRON, RETICCTPCT in the last 72 hours. Urine analysis:    Component Value Date/Time   COLORURINE YELLOW (A) 06/02/2020 2045   APPEARANCEUR HAZY (A) 06/02/2020 2045   LABSPEC 1.018 06/02/2020 2045   PHURINE 5.0 06/02/2020 2045   GLUCOSEU NEGATIVE 06/02/2020 2045   HGBUR NEGATIVE 06/02/2020 2045   BILIRUBINUR NEGATIVE 06/02/2020 2045   KETONESUR NEGATIVE 06/02/2020 2045   PROTEINUR NEGATIVE 06/02/2020 2045   NITRITE NEGATIVE 06/02/2020 2045   LEUKOCYTESUR LARGE (A) 06/02/2020 2045    Radiological Exams on Admission: CT ABDOMEN PELVIS WO CONTRAST  Result Date: 07/04/2020 CLINICAL DATA:  Provided history of: Abdominal pain, acute (Ped 0-18y) adult. mid abd pain, nausea, (prior pancreatic issues) EXAM: CT ABDOMEN AND PELVIS WITHOUT CONTRAST TECHNIQUE: Multidetector CT imaging of the abdomen and pelvis was performed following the standard protocol without IV contrast. COMPARISON:  CT 07/20/2019 FINDINGS: Lower chest: No pleural fluid or acute airspace disease. Normal heart size with coronary artery calcifications. Hepatobiliary: Diffuse hepatic steatosis. Geographic area of decreased density involving the posterior right lobe of the liver is likely geographic steatosis, but nonspecific, for example series 2, image 11. Gallstones without gallbladder inflammation. Common bile duct is not well-defined, however no evidence of biliary dilatation or choledocholithiasis. Pancreas: Moderate peripancreatic fat stranding and edema most prominently affecting the head and uncinate process, as well as the proximal body. There is an 11 mm hypodensity within the pancreatic head, series 2, image 28, not definitively seen on prior. No pancreatic air. Spleen: Normal in size without focal abnormality. Small splenule inferiorly.  Adrenals/Urinary Tract: No adrenal nodule. No hydronephrosis or renal calculi. Mild chronic bilateral perinephric edema which is symmetric. No evidence of focal renal lesion. Minimally distended urinary bladder, chronically thick walled. Stomach/Bowel: Bowel assessment is limited in the absence of enteric contrast. No abnormal gastric distension. Two pills within the distal stomach. Mild wall thickening of the duodenum is presumably reactive. No small bowel obstruction or inflammation. Presumed right hemicolectomy with chain sutures no right upper quadrant of the colon. Small to moderate volume of colonic stool without colonic wall thickening or inflammatory change. Vascular/Lymphatic: Aorto bi-iliac atherosclerosis. No aortic aneurysm. There is no portal venous or mesenteric gas. Cannot assess for splenic vein patency. No bulky abdominopelvic adenopathy. Reproductive: Enlarged prostate gland causing mass effect on the  bladder base, prostate gland spans 5.2 cm transverse. Other: Fat stranding and inflammation in the upper abdomen about the pancreas consistent with pancreatitis. Small amount of ill-defined fluid in the region of inflammation. No frank ascites. No free air. No abdominal wall hernia. Postsurgical change of the anterior abdominal wall. Musculoskeletal: Lumbar degenerative change. Posterior spurring at L3-L4 causes projection into the spinal canal. There are no acute or suspicious osseous abnormalities. IMPRESSION: 1. Acute edematous pancreatitis. There is an 11 mm hypodensity within the pancreatic head which is not definitively seen on prior. This may represent a small pseudocyst, however is nonspecific. Recommend attention on follow-up. 2. Hepatic steatosis. Geographic area of decreased density involving the posterior right lobe of the liver is likely geographic steatosis, but nonspecific. 3. Cholelithiasis without discrete gallbladder inflammation. 4. Enlarged prostate gland causing mass effect on the  bladder base. Chronic bladder wall thickening. Aortic Atherosclerosis (ICD10-I70.0). Electronically Signed   By: Narda Rutherford M.D.   On: 07/04/2020 17:36   DG Chest 2 View  Result Date: 07/04/2020 CLINICAL DATA:  Chest pain EXAM: CHEST - 2 VIEW COMPARISON:  02/04/2020 FINDINGS: The heart size and mediastinal contours are within normal limits. Atherosclerotic calcification aortic arch. Both lungs are clear. The visualized skeletal structures are unremarkable. IMPRESSION: No active cardiopulmonary disease. Electronically Signed   By: Marlan Palau M.D.   On: 07/04/2020 14:26   US Abdomen Complete  Result Date: 07/04/2020 CLINICAL DATA:  Abdominal pain and nausea. EXAM: ABDOMEN ULTRASOUND COMPLETE COMPARISON:  CT 07/04/2020, ultrasound 06/01/2020 FINDINGS: Gallbladder: 7 mm calculus in the gallbladder neck. No wall thickening visualized. No sonographic Murphy sign noted by sonographer. Common bile duct: Diameter: 2.7 mm Liver: Diffusely heterogeneous increased parenchymal echogenicity. Main portal vein is patent on color Doppler imaging with normal direction of blood flow towards the liver. Questionable nonocclusive thrombus at the takeoff of the right portal veins IVC: No abnormality visualized. Pancreas: Diffusely heterogeneous parenchyma. Spleen: Size and appearance within normal limits. Right Kidney: Length: 12.1 cm. Echogenicity within normal limits. No mass or hydronephrosis visualized. 5 mm midpole renal calculus. Left Kidney: Length: 11.7 cm. Echogenicity within normal limits. No mass or hydronephrosis visualized. Abdominal aorta: No aneurysm visualized. Other findings: None. IMPRESSION: Diffusely heterogeneous parenchymal echogenicity of the liver, usually seen with intrinsic liver disease. Questionable nonocclusive thrombus at the takeoff of the right portal vein. Heterogeneous appearance of the pancreas. Cholelithiasis without evidence of acute cholecystitis. 5 mm nonobstructive right renal  calculus. Electronically Signed   By: Ted Mcalpine M.D.   On: 07/04/2020 18:40      Assessment/Plan  Acute pancreatitis -Lipase of 568. Abd U/S shows cholelithiasis without cholecystitis. Last Triglyceride 5 months ago was at 84.  Most likely secondary to heavy alcohol use.  -Continuous IV fluids 100cc/hr -PRN morphine pain management   Recent nonocclusive portal vein thrombus -Found on 06/01/2020 -Patient has been noncompliant with medication.  Resume Eliquis.  Chronic tobacco abuse -endorse 2-3 beers daily but during previous admission has endorsed 6 to 12 packs of beer daily -place on CIWA protocol  Paroxysmal atrial fibrillation - Continue metoprolol.    DVT prophylaxis:.Eliquis Code Status: Full Family Communication: Plan discussed with patient at bedside  disposition Plan: Home with observation Consults called:  Admission status: Observation  Level of care: Med-Surg  Status is: Observation  The patient remains OBS appropriate and will d/c before 2 midnights.  Dispo: The patient is from: Home              Anticipated d/c  is to: Home              Patient currently is not medically stable to d/c.   Difficult to place patient No         Anselm Junglinghing T Lillan Mccreadie DO [facility]   If 7PM-7AM, please contact night-coverage [email or web address]   07/04/2020, 8:40 PM

## 2020-07-04 NOTE — ED Triage Notes (Signed)
Pt reports was fine when he went to bed last pm and this am when he woke up he hurt all over his body. Pt also reports some CP but denies SOB.

## 2020-07-04 NOTE — ED Provider Notes (Signed)
[facility] Emergency Department Provider Note ____________________________________________   Event Date/Time   First MD Initiated Contact with Patient 07/04/20 1639     (approximate)  I have reviewed the triage vital signs and the nursing notes.  HISTORY  Chief Complaint Generalized Body Aches and Chest Pain  HPI PMH "paroxysmal atrial fibrillation not chronically anticoagulated, coronary artery disease status post MI, chronic tobacco abuse, chronic alcohol abuse, acute pancreatitis, chronic transaminitis" who was admitted to [facility] on 06/01/2020 with nonocclusive portal vein thrombus-supposed to be on Eliquis but he reports that he has not been on that medication now for several weeks Patient reports last night he made chicken, he put considerable amount of spicy pepper on it and reports he just was not paying attention when he sees and that and after eating he reports he ate a large amount of very spicy pepper.  He started feeling nauseated a little bit achy, wanted to vomit but did not.  Since then has had persistent pain in his mid upper abdomen which she describes as a severe burning feeling that radiates towards his back.  No chest pain no fevers or chills.  He does report his whole body hurts feels like a sharp discomfort through all of his muscles and body but centered around his mid abdomen  No tick bites.  No other recent illness other than he was hospitalized for a blood clot in his pancreas as he describes it, and was supposed to be taking medication for that but reports things got "shuffled around" and he lost all of his medication       Past Medical History:  Diagnosis Date   Acute pancreatitis    COVID-19 virus infection    positive COVID-19 test on 01/22/20   MI (myocardial infarction) (HCC)    Paroxysmal atrial fibrillation [facility])     Patient Active Problem List   Diagnosis Date Noted   Abdominal pain 06/02/2020    Transaminitis 06/02/2020   Leukocytosis 06/02/2020   AF (paroxysmal atrial fibrillation) (HCC) 06/02/2020   Portal vein thrombosis 06/01/2020   Acute metabolic encephalopathy    Failure to thrive in adult 02/13/2020   COVID-19 01/29/2020   COVID-19 virus infection 01/28/2020   Abnormal LFTs 01/28/2020   CAD (coronary artery disease) 01/28/2020   Atrial fibrillation, chronic (HCC) 01/28/2020   Acute respiratory disease due to COVID-19 virus 01/28/2020   Tobacco abuse 01/28/2020   Acute pancreatitis 07/20/2019   Hypomagnesemia 11/24/2018   Protein-calorie malnutrition, severe 11/23/2018   Pleural effusion on left    Liver function test abnormality    AKI (acute kidney injury) (HCC)    Atrial fibrillation with rapid ventricular response (HCC)    Acute hepatitis 11/17/2018   Atrial fibrillation with RVR (HCC) 11/17/2018   Alcohol abuse 11/17/2018   Tobacco dependence syndrome 11/17/2018   Empyema (HCC)     History reviewed. No pertinent surgical history.  Prior to Admission medications   Medication Sig Start Date End Date Taking? Authorizing Provider  acetaminophen (TYLENOL) 325 MG tablet Take 2 tablets (650 mg total) by mouth every 6 (six) hours as needed for mild pain or fever. Patient not taking: Reported on 06/01/2020 11/25/18   Lorretta Harp, MD  albuterol (VENTOLIN HFA) 108 (90 Base) MCG/ACT inhaler Inhale 2 puffs into the lungs every 4 (four) hours as needed for wheezing or shortness of breath. Patient not taking: Reported on 06/01/2020 01/22/20   Dionne Bucy, MD  apixaban (ELIQUIS) 5 MG TABS  tablet Take 2 tablets (10 mg total) by mouth 2 (two) times daily for 7 days. 06/03/20 06/10/20  Charise Killian, MD  apixaban (ELIQUIS) 5 MG TABS tablet Take 1 tablet (5 mg total) by mouth 2 (two) times daily. Only start taking this script after you have completed 10mg  BID x 7 days 06/10/20 07/10/20  07/12/20, MD  magnesium oxide (MAG-OX) 400 (241.3 Mg) MG tablet Take 1  tablet (400 mg total) by mouth 2 (two) times daily. Patient not taking: No sig reported 02/26/20   04/25/20, MD  methocarbamol (ROBAXIN) 500 MG tablet Take 1 tablet (500 mg total) by mouth every 6 (six) hours as needed for muscle spasms. Patient not taking: No sig reported 05/30/20   06/01/20, PA-C  methylPREDNISolone (MEDROL DOSEPAK) 4 MG TBPK tablet Take Tapered dose as directed Patient not taking: No sig reported 05/30/20   06/01/20, PA-C  metoprolol tartrate (LOPRESSOR) 25 MG tablet Take 1 tablet (25 mg total) by mouth 2 (two) times daily. Patient not taking: No sig reported 02/26/20   04/25/20, MD  Multiple Vitamin (MULTIVITAMIN WITH MINERALS) TABS tablet Take 1 tablet by mouth daily. Patient not taking: No sig reported 02/26/20   04/25/20, MD  potassium chloride (KLOR-CON) 20 MEQ packet Take 20 mEq by mouth daily. Patient not taking: No sig reported 02/26/20   04/25/20, MD  predniSONE (DELTASONE) 20 MG tablet Take by mouth. 05/20/20   [provider]    Allergies Patient has no known allergies.  Family History  Problem Relation Age of Onset   Cancer Mother    Kidney disease Brother     Social History Social History   Tobacco Use   Smoking status: Every Day    Packs/day: 1.00    Years: 30.00    Pack years: 30.00    Types: Cigarettes   Smokeless tobacco: Never  Substance Use Topics   Alcohol use: Yes    Alcohol/week: 6.0 standard drinks    Types: 6 Cans of beer per week    Comment: daily   Drug use: Yes    Types: Marijuana    Review of Systems Constitutional: No fever/chills Eyes: No visual changes. ENT: No sore throat. Cardiovascular: Denies chest pain. Respiratory: Denies shortness of breath. Gastrointestinal: See HPI.  No diarrhea.  No vomiting.  Some nausea Genitourinary: Negative for dysuria. Musculoskeletal: Negative for back pain, except reports every body muscle is achy. Skin: Negative for rash.  No tick or insect  bites. Neurological: Negative for headaches, areas of focal weakness or numbness.    ____________________________________________   PHYSICAL EXAM:  VITAL SIGNS: ED Triage Vitals  Enc Vitals Group     BP 07/04/20 1349 (!) 148/94     Pulse Rate 07/04/20 1349 91     Resp 07/04/20 1349 18     Temp 07/04/20 1349 98.5 F (36.9 C)     Temp Source 07/04/20 1349 Oral     SpO2 07/04/20 1349 100 %     Weight 07/04/20 1346 143 lb 4.8 oz (65 kg)     Height 07/04/20 1346 5\' 9"  (1.753 m)     Head Circumference --      Peak Flow --      Pain Score 07/04/20 1346 7     Pain Loc --      Pain Edu? --      Excl. in GC? --     Constitutional: Alert and oriented. Well appearing  and in no acute distress.  Very pleasant.  Does appear in some pain with movement he seems to have pain in his mid abdomen. Eyes: Conjunctivae are normal. Head: Atraumatic. Nose: No congestion/rhinnorhea. Mouth/Throat: Mucous membranes are moist. Neck: No stridor.  Cardiovascular: Normal rate, regular rhythm. Grossly normal heart sounds.  Good peripheral circulation. Respiratory: Normal respiratory effort.  No retractions. Lungs CTAB. Gastrointestinal: Soft and moderately tender mostly in the periumbilical and epigastric region.  Some mild rebound tenderness in the epigastrium. No distention. Musculoskeletal: No lower extremity tenderness nor edema. Neurologic:  Normal speech and language. No gross focal neurologic deficits are appreciated.  Skin:  Skin is warm, dry and intact. No rash noted. Psychiatric: Mood and affect are normal. Speech and behavior are normal.  ____________________________________________   LABS (all labs ordered are listed, but only abnormal results are displayed)  Labs Reviewed  BASIC METABOLIC PANEL - Abnormal; Notable for the following components:      Result Value   CO2 21 (*)    Glucose, Bld 119 (*)    All other components within normal limits  LIPASE, BLOOD - Abnormal; Notable for  the following components:   Lipase 568 (*)    All other components within normal limits  HEPATIC FUNCTION PANEL - Abnormal; Notable for the following components:   AST 79 (*)    All other components within normal limits  RESP PANEL BY RT-PCR (FLU A&B, COVID) ARPGX2  CBC  CK  COMPREHENSIVE METABOLIC PANEL  TROPONIN I (HIGH SENSITIVITY)  TROPONIN I (HIGH SENSITIVITY)   ____________________________________________  EKG  Is reviewed inter by me at 1350 Heart rate 90 QRS 80 QTc 450 Normal sinus rhythm, occasional sinus arrhythmia.  Possible left ventricular hypertrophy.  No evidence of ischemia ____________________________________________  RADIOLOGY  CT ABDOMEN PELVIS WO CONTRAST  Result Date: 07/04/2020 CLINICAL DATA:  Provided history of: Abdominal pain, acute (Ped 0-18y) adult. mid abd pain, nausea, (prior pancreatic issues) EXAM: CT ABDOMEN AND PELVIS WITHOUT CONTRAST TECHNIQUE: Multidetector CT imaging of the abdomen and pelvis was performed following the standard protocol without IV contrast. COMPARISON:  CT 07/20/2019 FINDINGS: Lower chest: No pleural fluid or acute airspace disease. Normal heart size with coronary artery calcifications. Hepatobiliary: Diffuse hepatic steatosis. Geographic area of decreased density involving the posterior right lobe of the liver is likely geographic steatosis, but nonspecific, for example series 2, image 11. Gallstones without gallbladder inflammation. Common bile duct is not well-defined, however no evidence of biliary dilatation or choledocholithiasis. Pancreas: Moderate peripancreatic fat stranding and edema most prominently affecting the head and uncinate process, as well as the proximal body. There is an 11 mm hypodensity within the pancreatic head, series 2, image 28, not definitively seen on prior. No pancreatic air. Spleen: Normal in size without focal abnormality. Small splenule inferiorly. Adrenals/Urinary Tract: No adrenal nodule. No  hydronephrosis or renal calculi. Mild chronic bilateral perinephric edema which is symmetric. No evidence of focal renal lesion. Minimally distended urinary bladder, chronically thick walled. Stomach/Bowel: Bowel assessment is limited in the absence of enteric contrast. No abnormal gastric distension. Two pills within the distal stomach. Mild wall thickening of the duodenum is presumably reactive. No small bowel obstruction or inflammation. Presumed right hemicolectomy with chain sutures no right upper quadrant of the colon. Small to moderate volume of colonic stool without colonic wall thickening or inflammatory change. Vascular/Lymphatic: Aorto bi-iliac atherosclerosis. No aortic aneurysm. There is no portal venous or mesenteric gas. Cannot assess for splenic vein patency. No bulky abdominopelvic adenopathy. Reproductive:  Enlarged prostate gland causing mass effect on the bladder base, prostate gland spans 5.2 cm transverse. Other: Fat stranding and inflammation in the upper abdomen about the pancreas consistent with pancreatitis. Small amount of ill-defined fluid in the region of inflammation. No frank ascites. No free air. No abdominal wall hernia. Postsurgical change of the anterior abdominal wall. Musculoskeletal: Lumbar degenerative change. Posterior spurring at L3-L4 causes projection into the spinal canal. There are no acute or suspicious osseous abnormalities. IMPRESSION: 1. Acute edematous pancreatitis. There is an 11 mm hypodensity within the pancreatic head which is not definitively seen on prior. This may represent a small pseudocyst, however is nonspecific. Recommend attention on follow-up. 2. Hepatic steatosis. Geographic area of decreased density involving the posterior right lobe of the liver is likely geographic steatosis, but nonspecific. 3. Cholelithiasis without discrete gallbladder inflammation. 4. Enlarged prostate gland causing mass effect on the bladder base. Chronic bladder wall  thickening. Aortic Atherosclerosis (ICD10-I70.0). Electronically Signed   By: Narda RutherfordMelanie  Sanford M.D.   On: 07/04/2020 17:36   DG Chest 2 View  Result Date: 07/04/2020 CLINICAL DATA:  Chest pain EXAM: CHEST - 2 VIEW COMPARISON:  02/04/2020 FINDINGS: The heart size and mediastinal contours are within normal limits. Atherosclerotic calcification aortic arch. Both lungs are clear. The visualized skeletal structures are unremarkable. IMPRESSION: No active cardiopulmonary disease. Electronically Signed   By: Marlan Palauharles  Clark M.D.   On: 07/04/2020 14:26   US Abdomen Complete  Result Date: 07/04/2020 CLINICAL DATA:  Abdominal pain and nausea. EXAM: ABDOMEN ULTRASOUND COMPLETE COMPARISON:  CT 07/04/2020, ultrasound 06/01/2020 FINDINGS: Gallbladder: 7 mm calculus in the gallbladder neck. No wall thickening visualized. No sonographic Murphy sign noted by sonographer. Common bile duct: Diameter: 2.7 mm Liver: Diffusely heterogeneous increased parenchymal echogenicity. Main portal vein is patent on color Doppler imaging with normal direction of blood flow towards the liver. Questionable nonocclusive thrombus at the takeoff of the right portal veins IVC: No abnormality visualized. Pancreas: Diffusely heterogeneous parenchyma. Spleen: Size and appearance within normal limits. Right Kidney: Length: 12.1 cm. Echogenicity within normal limits. No mass or hydronephrosis visualized. 5 mm midpole renal calculus. Left Kidney: Length: 11.7 cm. Echogenicity within normal limits. No mass or hydronephrosis visualized. Abdominal aorta: No aneurysm visualized. Other findings: None. IMPRESSION: Diffusely heterogeneous parenchymal echogenicity of the liver, usually seen with intrinsic liver disease. Questionable nonocclusive thrombus at the takeoff of the right portal vein. Heterogeneous appearance of the pancreas. Cholelithiasis without evidence of acute cholecystitis. 5 mm nonobstructive right renal calculus. Electronically Signed   By:  Ted Mcalpineobrinka  Dimitrova M.D.   On: 07/04/2020 18:40     Imaging reviewed as above, noted multiple findings.  Questionable nonocclusive thrombus right portal vein seen on ultrasound.  Additionally CT imaging shows acute edematous pancreatitis.  Multiple findings and details as above, but in summary patient appears to have acute pancreatitis which I think explains his pain and symptomatology, but also noted are multiple incidental and other associated findings including potential nonocclusive thrombus right portal vein ____________________________________________   PROCEDURES  Procedure(s) performed: None  Procedures  Critical Care performed: No  ____________________________________________   INITIAL IMPRESSION / ASSESSMENT AND PLAN / ED COURSE  Pertinent labs & imaging results that were available during my care of the patient were reviewed by me and considered in my medical decision making (see chart for details).   Patient presents for concerns of myalgias associate with a burning feeling in his mid abdomen since last night he reports this started after taking spicy pepper, which  may or may not be related to his presentation today.  Differential diagnosis is broad but seems to be centered primarily on some sort of presentation consistent with myalgias, associated with mid abdominal epigastric pain.  History of previous thrombosis of the portal vein.  Will evaluate with ultrasound complete /pancrease and due to contrast shortage, will obtain noncontrast CT imaging the abdomen and pelvis    Patient does feel improved pain appears improved and overall status seems to be improving with hydration, pain control antiemetics.  Discussed with the patient, after reviewing his CT scan etc. and at this point I suspect his primary underlying etiology is that of acute pancreatitis.  He does have a history of heavy alcohol abuse in the past, but reports that he is titrated this down quite a bit, denies getting  tremors with the shakes, reports that he last had a drink a few days ago and is not a daily drinker at this point.  Admission discussed with Dr. Cyndia Bent  ____________________________________________   FINAL CLINICAL IMPRESSION(S) / ED DIAGNOSES  Final diagnoses:  Abdominal pain  Alcohol-induced acute pancreatitis without infection or necrosis        Note:  This document was prepared using Dragon voice recognition software and may include unintentional dictation errors       Sharyn Creamer, MD 07/05/20 0001

## 2020-07-05 DIAGNOSIS — K852 Alcohol induced acute pancreatitis without necrosis or infection: Secondary | ICD-10-CM | POA: Diagnosis not present

## 2020-07-05 DIAGNOSIS — F1010 Alcohol abuse, uncomplicated: Secondary | ICD-10-CM

## 2020-07-05 LAB — COMPREHENSIVE METABOLIC PANEL
ALT: 21 U/L (ref 0–44)
AST: 45 U/L — ABNORMAL HIGH (ref 15–41)
Albumin: 3 g/dL — ABNORMAL LOW (ref 3.5–5.0)
Alkaline Phosphatase: 104 U/L (ref 38–126)
Anion gap: 5 (ref 5–15)
BUN: 8 mg/dL (ref 8–23)
CO2: 22 mmol/L (ref 22–32)
Calcium: 8.6 mg/dL — ABNORMAL LOW (ref 8.9–10.3)
Chloride: 109 mmol/L (ref 98–111)
Creatinine, Ser: 0.56 mg/dL — ABNORMAL LOW (ref 0.61–1.24)
GFR, Estimated: >60 mL/min (ref >60)
Glucose, Bld: 106 mg/dL — ABNORMAL HIGH (ref 70–99)
Potassium: 3.4 mmol/L — ABNORMAL LOW (ref 3.5–5.1)
Sodium: 136 mmol/L (ref 135–145)
Total Bilirubin: 1.2 mg/dL (ref 0.3–1.2)
Total Protein: 6.2 g/dL — ABNORMAL LOW (ref 6.5–8.1)

## 2020-07-05 LAB — TROPONIN I (HIGH SENSITIVITY): Troponin I (High Sensitivity): 5 ng/L (ref <18)

## 2020-07-05 MED ORDER — POTASSIUM CHLORIDE 10 MEQ/100ML IV SOLN
10.0000 meq | INTRAVENOUS | Status: AC
  Administered 2020-07-05 (×2): 10 meq via INTRAVENOUS
  Filled 2020-07-05: qty 100

## 2020-07-05 NOTE — Progress Notes (Signed)
Progress Note    Thomas Coffey  ZOX:[id number] DOB: 1955/07/01  DOA: 07/04/2020 PCP: Oswaldo Conroy, MD    Brief Narrative:     Medical records reviewed and are as summarized below:  Thomas Coffey is an 65 y.o. 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  Found to have pancreatitis.    Assessment/Plan:   Principal Problem:   Acute pancreatitis Active Problems:   Alcohol abuse   Portal vein thrombosis   AF (paroxysmal atrial fibrillation) (HCC)   Acute pancreatitis -Lipase of 568. Abd U/S shows cholelithiasis without cholecystitis. Last Triglyceride 5 months ago was at 84. Most likely secondary to heavy alcohol use. -advance diet as tolerated   Recent nonocclusive portal vein thrombus -Found on 06/01/2020 -Patient has been noncompliant with medication.  Resume Eliquis.   Chronic alcohol abuse -endorse 2-3 beers daily but during previous admission has endorsed 6 to 12 packs of beer daily -place on CIWA protocol -encourage cessation   Paroxysmal atrial fibrillation - Continue metoprolol.   -continue eliquis   Family Communication/Anticipated D/C date and plan/Code Status   DVT prophylaxis: Lovenox ordered. Code Status: Full Code.   Disposition Plan: Status is: Observation  The patient will require care spanning > 2 midnights and should be moved to inpatient because: Inpatient level of care appropriate due to severity of illness  Dispo: The patient is from: Home              Anticipated d/c is to: Home              Patient currently is not medically stable to d/c. Home in the AM?     Difficult to place patient No         Medical Consultants:   None.   Subjective:   Pain  better-- asking for diet to be advanced  Objective:    Vitals:   07/05/20 0022 07/05/20 0413 07/05/20 0955 07/05/20 1111  BP: 124/81 116/76 111/79 108/67  Pulse: 81 71 69 77  Resp: 18 16 16 16   Temp: 97.8 F (36.6 C) 98 F (36.7 C) 97.7 F (36.5 C) 97.9 F (36.6 C)  TempSrc: Oral Oral Oral Oral  SpO2: 98% 97% 95% 95%  Weight:      Height:        Intake/Output Summary (Last 24 hours) at 07/05/2020 1329 Last data filed at 07/05/2020 1029 Gross per 24 hour  Intake 1444.83 ml  Output 275 ml  Net 1169.83 ml   Filed Weights   07/04/20 1346  Weight: 65 kg    Exam:  General: Appearance:    In bed male in no acute distress     Lungs:     respirations unlabored  Heart:    Normal heart rate.    MS:   All extremities are intact.    Neurologic:   Awake, alert, oriented x 3.      Data Reviewed:   I have personally reviewed following labs and imaging studies:  Labs: Labs show the following:   Basic Metabolic Panel: Recent Labs  Lab 07/04/20 1350 07/05/20 0420  NA 136 136  K 3.8 3.4*  CL 107 109  CO2 21* 22  GLUCOSE 119* 106*  BUN 9 8  CREATININE 0.71 0.56*  CALCIUM 9.2  8.6*   GFR Estimated Creatinine Clearance: 85.8 mL/min (A) (by C-G formula based on SCr of 0.56 mg/dL (L)). Liver Function Tests: Recent Labs  Lab 07/04/20 1350 07/05/20 0420  AST 79* 45*  ALT 32 21  ALKPHOS 124 104  BILITOT 1.0 1.2  PROT 7.7 6.2*  ALBUMIN 3.7 3.0*   Recent Labs  Lab 07/04/20 1350  LIPASE 568*   No results for input(s): AMMONIA in the last 168 hours. Coagulation profile No results for input(s): INR, PROTIME in the last 168 hours.  CBC: Recent Labs  Lab 07/04/20 1350  WBC 9.0  HGB 14.9  HCT 46.1  MCV 90.9  PLT 217   Cardiac Enzymes: Recent Labs  Lab 07/04/20 1350  CKTOTAL 57   BNP (last 3 results) No results for input(s): PROBNP in the last 8760 hours. CBG: No results for input(s): GLUCAP in the last 168 hours. D-Dimer: No results for input(s):  DDIMER in the last 72 hours. Hgb A1c: No results for input(s): HGBA1C in the last 72 hours. Lipid Profile: No results for input(s): CHOL, HDL, LDLCALC, TRIG, CHOLHDL, LDLDIRECT in the last 72 hours. Thyroid function studies: No results for input(s): TSH, T4TOTAL, T3FREE, THYROIDAB in the last 72 hours.  Invalid input(s): FREET3 Anemia work up: No results for input(s): VITAMINB12, FOLATE, FERRITIN, TIBC, IRON, RETICCTPCT in the last 72 hours. Sepsis Labs: Recent Labs  Lab 07/04/20 1350  WBC 9.0    Microbiology Recent Results (from the past 240 hour(s))  Resp Panel by RT-PCR (Flu A&B, Covid) Nasopharyngeal Swab     Status: None   Collection Time: 07/04/20  6:43 PM   Specimen: Nasopharyngeal Swab; Nasopharyngeal(NP) swabs in vial transport medium  Result Value Ref Range Status   SARS Coronavirus 2 by RT PCR NEGATIVE NEGATIVE Final    Comment: (NOTE) SARS-CoV-2 target nucleic acids are NOT DETECTED.  The SARS-CoV-2 RNA is generally detectable in upper respiratory specimens during the acute phase of infection. The lowest concentration of SARS-CoV-2 viral copies this assay can detect is 138 copies/mL. A negative result does not preclude SARS-Cov-2 infection and should not be used as the sole basis for treatment or other patient management decisions. A negative result may occur with  improper specimen collection/handling, submission of specimen other than nasopharyngeal swab, presence of viral mutation(s) within the areas targeted by this assay, and inadequate number of viral copies(<138 copies/mL). A negative result must be combined with clinical observations, patient history, and epidemiological information. The expected result is Negative.  Fact Sheet for Patients:  BloggerCourse.com  Fact Sheet for Healthcare Providers:  SeriousBroker.it  This test is no t yet approved or cleared by the [place] FDA and  has been  authorized for detection and/or diagnosis of SARS-CoV-2 by FDA under an Emergency Use Authorization (EUA). This EUA will remain  in effect (meaning this test can be used) for the duration of the COVID-19 declaration under Section 564(b)(1) of the Act, 21 U.S.C.section 360bbb-3(b)(1), unless the authorization is terminated  or revoked sooner.       Influenza A by PCR NEGATIVE NEGATIVE Final   Influenza B by PCR NEGATIVE NEGATIVE Final    Comment: (NOTE) The Xpert Xpress SARS-CoV-2/FLU/RSV plus assay is intended as an aid in the diagnosis of influenza from Nasopharyngeal swab specimens and should not be used as a sole basis for treatment. Nasal washings and aspirates are unacceptable for Xpert Xpress SARS-CoV-2/FLU/RSV testing.  Fact Sheet for Patients: BloggerCourse.com  Fact Sheet for Healthcare Providers: SeriousBroker.it  This  test is not yet approved or cleared by the [place] and has been authorized for detection and/or diagnosis of SARS-CoV-2 by FDA under an Emergency Use Authorization (EUA). This EUA will remain in effect (meaning this test can be used) for the duration of the COVID-19 declaration under Section 564(b)(1) of the Act, 21 U.S.C. section 360bbb-3(b)(1), unless the authorization is terminated or revoked.  Performed at [facility], [address] [place], [place] [address]     Procedures and diagnostic studies:  CT ABDOMEN PELVIS WO CONTRAST  Result Date: 07/04/2020 CLINICAL DATA:  Provided history of: Abdominal pain, acute (Ped 0-18y) adult. mid abd pain, nausea, (prior pancreatic issues) EXAM: CT ABDOMEN AND PELVIS WITHOUT CONTRAST TECHNIQUE: Multidetector CT imaging of the abdomen and pelvis was performed following the standard protocol without IV contrast. COMPARISON:  CT 07/20/2019 FINDINGS: Lower chest: No pleural fluid or acute airspace disease. Normal heart size with coronary artery  calcifications. Hepatobiliary: Diffuse hepatic steatosis. Geographic area of decreased density involving the posterior right lobe of the liver is likely geographic steatosis, but nonspecific, for example series 2, image 11. Gallstones without gallbladder inflammation. Common bile duct is not well-defined, however no evidence of biliary dilatation or choledocholithiasis. Pancreas: Moderate peripancreatic fat stranding and edema most prominently affecting the head and uncinate process, as well as the proximal body. There is an 11 mm hypodensity within the pancreatic head, series 2, image 28, not definitively seen on prior. No pancreatic air. Spleen: Normal in size without focal abnormality. Small splenule inferiorly. Adrenals/Urinary Tract: No adrenal nodule. No hydronephrosis or renal calculi. Mild chronic bilateral perinephric edema which is symmetric. No evidence of focal renal lesion. Minimally distended urinary bladder, chronically thick walled. Stomach/Bowel: Bowel assessment is limited in the absence of enteric contrast. No abnormal gastric distension. Two pills within the distal stomach. Mild wall thickening of the duodenum is presumably reactive. No small bowel obstruction or inflammation. Presumed right hemicolectomy with chain sutures no right upper quadrant of the colon. Small to moderate volume of colonic stool without colonic wall thickening or inflammatory change. Vascular/Lymphatic: Aorto bi-iliac atherosclerosis. No aortic aneurysm. There is no portal venous or mesenteric gas. Cannot assess for splenic vein patency. No bulky abdominopelvic adenopathy. Reproductive: Enlarged prostate gland causing mass effect on the bladder base, prostate gland spans 5.2 cm transverse. Other: Fat stranding and inflammation in the upper abdomen about the pancreas consistent with pancreatitis. Small amount of ill-defined fluid in the region of inflammation. No frank ascites. No free air. No abdominal wall hernia.  Postsurgical change of the anterior abdominal wall. Musculoskeletal: Lumbar degenerative change. Posterior spurring at L3-L4 causes projection into the spinal canal. There are no acute or suspicious osseous abnormalities. IMPRESSION: 1. Acute edematous pancreatitis. There is an 11 mm hypodensity within the pancreatic head which is not definitively seen on prior. This may represent a small pseudocyst, however is nonspecific. Recommend attention on follow-up. 2. Hepatic steatosis. Geographic area of decreased density involving the posterior right lobe of the liver is likely geographic steatosis, but nonspecific. 3. Cholelithiasis without discrete gallbladder inflammation. 4. Enlarged prostate gland causing mass effect on the bladder base. Chronic bladder wall thickening. Aortic Atherosclerosis (ICD10-I70.0). Electronically Signed   By: Narda Rutherford M.D.   On: 07/04/2020 17:36   DG Chest 2 View  Result Date: 07/04/2020 CLINICAL DATA:  Chest pain EXAM: CHEST - 2 VIEW COMPARISON:  02/04/2020 FINDINGS: The heart size and mediastinal contours are within normal limits. Atherosclerotic calcification aortic arch. Both lungs are  clear. The visualized skeletal structures are unremarkable. IMPRESSION: No active cardiopulmonary disease. Electronically Signed   By: Marlan Palau M.D.   On: 07/04/2020 14:26   US Abdomen Complete  Result Date: 07/04/2020 CLINICAL DATA:  Abdominal pain and nausea. EXAM: ABDOMEN ULTRASOUND COMPLETE COMPARISON:  CT 07/04/2020, ultrasound 06/01/2020 FINDINGS: Gallbladder: 7 mm calculus in the gallbladder neck. No wall thickening visualized. No sonographic Murphy sign noted by sonographer. Common bile duct: Diameter: 2.7 mm Liver: Diffusely heterogeneous increased parenchymal echogenicity. Main portal vein is patent on color Doppler imaging with normal direction of blood flow towards the liver. Questionable nonocclusive thrombus at the takeoff of the right portal veins IVC: No abnormality  visualized. Pancreas: Diffusely heterogeneous parenchyma. Spleen: Size and appearance within normal limits. Right Kidney: Length: 12.1 cm. Echogenicity within normal limits. No mass or hydronephrosis visualized. 5 mm midpole renal calculus. Left Kidney: Length: 11.7 cm. Echogenicity within normal limits. No mass or hydronephrosis visualized. Abdominal aorta: No aneurysm visualized. Other findings: None. IMPRESSION: Diffusely heterogeneous parenchymal echogenicity of the liver, usually seen with intrinsic liver disease. Questionable nonocclusive thrombus at the takeoff of the right portal vein. Heterogeneous appearance of the pancreas. Cholelithiasis without evidence of acute cholecystitis. 5 mm nonobstructive right renal calculus. Electronically Signed   By: Ted Mcalpine M.D.   On: 07/04/2020 18:40    Medications:    alum & mag hydroxide-simeth  15 mL Oral Once   apixaban  5 mg Oral BID   famotidine  20 mg Oral Once   folic acid  1 mg Oral Daily   metoprolol tartrate  25 mg Oral BID   multivitamin with minerals  1 tablet Oral Daily   thiamine  100 mg Oral Daily   Or   thiamine  100 mg Intravenous Daily   Continuous Infusions:  sodium chloride 100 mL/hr at 07/05/20 0734     LOS: 0 days   Thomas Coffey  [facility]   How to contact the [facility] Attending or Consulting provider 7A - 7P or covering provider during after hours 7P -7A, for this patient?  Check the care team in [facility] and look for a) attending/consulting [facility] provider listed and b) the [facility] team listed Log into [email or web address] and use [place]'s universal password to access. If you do not have the password, please contact the hospital operator. Locate the [facility] provider you are looking for under [facility] and page to a number that you can be directly reached. If you still have difficulty reaching the provider, please page the [facility] (Director on Call) for the Hospitalists listed on amion for assistance.  07/05/2020,  1:29 PM

## 2020-07-06 DIAGNOSIS — K852 Alcohol induced acute pancreatitis without necrosis or infection: Secondary | ICD-10-CM | POA: Diagnosis not present

## 2020-07-06 MED ORDER — TRAMADOL HCL 50 MG PO TABS
50.0000 mg | ORAL_TABLET | Freq: Four times a day (QID) | ORAL | Status: DC | PRN
  Administered 2020-07-06: 50 mg via ORAL
  Filled 2020-07-06: qty 1

## 2020-07-06 MED ORDER — METOPROLOL TARTRATE 25 MG PO TABS: 12.5000 mg | ORAL_TABLET | Freq: Two times a day (BID) | ORAL | 2 refills | Status: DC

## 2020-07-06 MED ORDER — HYDROCODONE-ACETAMINOPHEN 5-325 MG PO TABS: 1.0000 | ORAL_TABLET | Freq: Four times a day (QID) | ORAL | Status: DC | PRN

## 2020-07-06 MED ORDER — HYDROCODONE-ACETAMINOPHEN 5-325 MG PO TABS: 1.0000 | ORAL_TABLET | Freq: Four times a day (QID) | ORAL | 0 refills | Status: AC | PRN

## 2020-07-06 NOTE — Discharge Summary (Signed)
Physician Discharge Summary  Thomas Coffey TDS:[id number] DOB: 1955/10/29 DOA: 07/04/2020  PCP: Oswaldo Conroy, MD  Admit date: 07/04/2020 Discharge date: 07/06/2020  Admitted From: home Discharge disposition: home   Recommendations for Outpatient Follow-Up:   Ensure eliquis compliance Alcohol cessation   Discharge Diagnosis:   Principal Problem:   Acute pancreatitis Active Problems:   Alcohol abuse   Portal vein thrombosis   AF (paroxysmal atrial fibrillation) (HCC)    Discharge Condition: Improved.  Diet recommendation: Low sodium, heart healthy.  Wound care: None.  Code status: Full.   History of Present Illness:   Thomas Coffey is a 65 y.o. 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   Patient states that he was having a cookout yesterday and ate some chicken with spices and drink some soda and then this morning woke up with headache and diffuse body ache down to his lower abdomen.  Also has mid abdominal pain and nausea but no vomiting.  Had some diarrhea about 2 days ago.  Denies any fever and states that he always feels cold.  Has history of hemicolectomy to what he describes as likely obstruction from adhesions.  He endorsed drinking [employer] and states he had 2-3 beers yesterday.  He has not had any alcohol today.  During a recent past admission he endorsed drinking 6 to 12 packs of beer daily.   Hospital Course by Problem:   Acute pancreatitis -Lipase of 568. Abd U/S shows cholelithiasis without cholecystitis. Last Triglyceride 5 months ago was at 84. Most likely secondary to heavy alcohol use. -advance diet as tolerated -much improved on day of d/c   Recent nonocclusive  portal vein thrombus -Found on 06/01/2020 -Patient has been noncompliant with medication.  Resume Eliquis.   Chronic alcohol abuse -endorse 2-3 beers daily but during previous admission has endorsed 6 to 12 packs of beer daily -encourage cessation -no sign of withdrawal   Paroxysmal atrial fibrillation - Continue metoprolol.   -continue eliquis      Medical Consultants:      Discharge Exam:   Vitals:   07/06/20 0750 07/06/20 1122  BP: 122/65 115/71  Pulse: (!) 59 (!) 58  Resp: 16 18  Temp: 97.7 F (36.5 C) 97.9 F (36.6 C)  SpO2: 98% 99%   Vitals:   07/06/20 0031 07/06/20 0415 07/06/20 0750 07/06/20 1122  BP: 105/74 107/77 122/65 115/71  Pulse: 70 (!) 58 (!) 59 (!) 58  Resp: 18 17 16 18   Temp: 98.1 F (36.7 C) 98.4 F (36.9 C) 97.7 F (36.5 C) 97.9 F (36.6 C)  TempSrc: Oral Oral Oral Oral  SpO2: 98% 98% 98% 99%  Weight:      Height:        General exam: Appears calm and comfortable.   The results of significant diagnostics from this hospitalization (including imaging, microbiology, ancillary and laboratory) are listed below for reference.     Procedures and Diagnostic Studies:   CT ABDOMEN PELVIS WO CONTRAST  Result Date: 07/04/2020 CLINICAL DATA:  Provided history of: Abdominal pain, acute (Ped 0-18y) adult. mid abd pain, nausea, (prior pancreatic issues) EXAM: CT ABDOMEN AND PELVIS WITHOUT CONTRAST TECHNIQUE: Multidetector CT imaging of the abdomen and pelvis was performed following the standard protocol without IV contrast. COMPARISON:  CT 07/20/2019 FINDINGS: Lower chest: No pleural fluid or acute airspace disease. Normal heart size with coronary artery calcifications. Hepatobiliary: Diffuse hepatic steatosis. Geographic area of decreased density involving the posterior right lobe of the liver is likely geographic steatosis, but nonspecific, for example series 2, image 11. Gallstones without gallbladder inflammation. Common bile duct is not well-defined,  however no evidence of biliary dilatation or choledocholithiasis. Pancreas: Moderate peripancreatic fat stranding and edema most prominently affecting the head and uncinate process, as well as the proximal body. There is an 11 mm hypodensity within the pancreatic head, series 2, image 28, not definitively seen on prior. No pancreatic air. Spleen: Normal in size without focal abnormality. Small splenule inferiorly. Adrenals/Urinary Tract: No adrenal nodule. No hydronephrosis or renal calculi. Mild chronic bilateral perinephric edema which is symmetric. No evidence of focal renal lesion. Minimally distended urinary bladder, chronically thick walled. Stomach/Bowel: Bowel assessment is limited in the absence of enteric contrast. No abnormal gastric distension. Two pills within the distal stomach. Mild wall thickening of the duodenum is presumably reactive. No small bowel obstruction or inflammation. Presumed right hemicolectomy with chain sutures no right upper quadrant of the colon. Small to moderate volume of colonic stool without colonic wall thickening or inflammatory change. Vascular/Lymphatic: Aorto bi-iliac atherosclerosis. No aortic aneurysm. There is no portal venous or mesenteric gas. Cannot assess for splenic vein patency. No bulky abdominopelvic adenopathy. Reproductive: Enlarged prostate gland causing mass effect on the bladder base, prostate gland spans 5.2 cm transverse. Other: Fat stranding and inflammation in the upper abdomen about the pancreas consistent with pancreatitis. Small amount of ill-defined fluid in the region of inflammation. No frank ascites. No free air. No abdominal wall hernia. Postsurgical change of the anterior abdominal wall. Musculoskeletal: Lumbar degenerative change. Posterior spurring at L3-L4 causes projection into the spinal canal. There are no acute or suspicious osseous abnormalities. IMPRESSION: 1. Acute edematous pancreatitis. There is an 11 mm hypodensity within the  pancreatic head which is not definitively seen on prior. This may represent a small pseudocyst, however is nonspecific. Recommend attention on follow-up. 2. Hepatic steatosis. Geographic area of decreased density involving the posterior right lobe of the liver is likely geographic steatosis, but nonspecific. 3. Cholelithiasis without discrete gallbladder inflammation. 4. Enlarged prostate gland causing mass effect on the bladder base. Chronic bladder wall thickening. Aortic Atherosclerosis (ICD10-I70.0). Electronically Signed   By: Narda Rutherford M.D.   On: 07/04/2020 17:36   DG Chest 2 View  Result Date: 07/04/2020 CLINICAL DATA:  Chest pain EXAM: CHEST - 2 VIEW COMPARISON:  02/04/2020 FINDINGS: The heart size and mediastinal contours are within normal limits. Atherosclerotic calcification aortic arch. Both lungs are clear. The visualized skeletal structures are unremarkable. IMPRESSION: No active cardiopulmonary disease. Electronically Signed   By: Marlan Palau M.D.   On: 07/04/2020 14:26   US Abdomen Complete  Result Date: 07/04/2020 CLINICAL DATA:  Abdominal pain and nausea. EXAM: ABDOMEN ULTRASOUND COMPLETE COMPARISON:  CT 07/04/2020, ultrasound 06/01/2020 FINDINGS: Gallbladder: 7 mm calculus in the gallbladder neck. No wall thickening visualized. No sonographic Murphy sign noted by sonographer. Common bile duct: Diameter: 2.7 mm Liver: Diffusely heterogeneous increased parenchymal echogenicity. Main portal vein is patent on color Doppler imaging with normal direction of blood flow towards the liver. Questionable nonocclusive thrombus at the takeoff of the right portal veins IVC: No abnormality visualized. Pancreas: Diffusely heterogeneous parenchyma. Spleen: Size and appearance within normal limits. Right Kidney: Length: 12.1 cm. Echogenicity within normal limits. No mass or hydronephrosis visualized. 5  mm midpole renal calculus. Left Kidney: Length: 11.7 cm. Echogenicity within normal limits. No  mass or hydronephrosis visualized. Abdominal aorta: No aneurysm visualized. Other findings: None. IMPRESSION: Diffusely heterogeneous parenchymal echogenicity of the liver, usually seen with intrinsic liver disease. Questionable nonocclusive thrombus at the takeoff of the right portal vein. Heterogeneous appearance of the pancreas. Cholelithiasis without evidence of acute cholecystitis. 5 mm nonobstructive right renal calculus. Electronically Signed   By: Ted Mcalpine M.D.   On: 07/04/2020 18:40     Labs:   Basic Metabolic Panel: Recent Labs  Lab 07/04/20 1350 07/05/20 0420  NA 136 136  K 3.8 3.4*  CL 107 109  CO2 21* 22  GLUCOSE 119* 106*  BUN 9 8  CREATININE 0.71 0.56*  CALCIUM 9.2 8.6*   GFR Estimated Creatinine Clearance: 85.8 mL/min (A) (by C-G formula based on SCr of 0.56 mg/dL (L)). Liver Function Tests: Recent Labs  Lab 07/04/20 1350 07/05/20 0420  AST 79* 45*  ALT 32 21  ALKPHOS 124 104  BILITOT 1.0 1.2  PROT 7.7 6.2*  ALBUMIN 3.7 3.0*   Recent Labs  Lab 07/04/20 1350  LIPASE 568*   No results for input(s): AMMONIA in the last 168 hours. Coagulation profile No results for input(s): INR, PROTIME in the last 168 hours.  CBC: Recent Labs  Lab 07/04/20 1350  WBC 9.0  HGB 14.9  HCT 46.1  MCV 90.9  PLT 217   Cardiac Enzymes: Recent Labs  Lab 07/04/20 1350  CKTOTAL 57   BNP: Invalid input(s): POCBNP CBG: No results for input(s): GLUCAP in the last 168 hours. D-Dimer No results for input(s): DDIMER in the last 72 hours. Hgb A1c No results for input(s): HGBA1C in the last 72 hours. Lipid Profile No results for input(s): CHOL, HDL, LDLCALC, TRIG, CHOLHDL, LDLDIRECT in the last 72 hours. Thyroid function studies No results for input(s): TSH, T4TOTAL, T3FREE, THYROIDAB in the last 72 hours.  Invalid input(s): FREET3 Anemia work up No results for input(s): VITAMINB12, FOLATE, FERRITIN, TIBC, IRON, RETICCTPCT in the last 72  hours. Microbiology Recent Results (from the past 240 hour(s))  Resp Panel by RT-PCR (Flu A&B, Covid) Nasopharyngeal Swab     Status: None   Collection Time: 07/04/20  6:43 PM   Specimen: Nasopharyngeal Swab; Nasopharyngeal(NP) swabs in vial transport medium  Result Value Ref Range Status   SARS Coronavirus 2 by RT PCR NEGATIVE NEGATIVE Final    Comment: (NOTE) SARS-CoV-2 target nucleic acids are NOT DETECTED.  The SARS-CoV-2 RNA is generally detectable in upper respiratory specimens during the acute phase of infection. The lowest concentration of SARS-CoV-2 viral copies this assay can detect is 138 copies/mL. A negative result does not preclude SARS-Cov-2 infection and should not be used as the sole basis for treatment or other patient management decisions. A negative result may occur with  improper specimen collection/handling, submission of specimen other than nasopharyngeal swab, presence of viral mutation(s) within the areas targeted by this assay, and inadequate number of viral copies(<138 copies/mL). A negative result must be combined with clinical observations, patient history, and epidemiological information. The expected result is Negative.  Fact Sheet for Patients:  BloggerCourse.com  Fact Sheet for Healthcare Providers:  SeriousBroker.it  This test is no t yet approved or cleared by the [place] FDA and  has been authorized for detection and/or diagnosis of SARS-CoV-2 by FDA under an Emergency Use Authorization (EUA). This EUA will remain  in effect (meaning this test can be used) for the  duration of the COVID-19 declaration under Section 564(b)(1) of the Act, 21 U.S.C.section 360bbb-3(b)(1), unless the authorization is terminated  or revoked sooner.       Influenza A by PCR NEGATIVE NEGATIVE Final   Influenza B by PCR NEGATIVE NEGATIVE Final    Comment: (NOTE) The Xpert Xpress SARS-CoV-2/FLU/RSV plus assay  is intended as an aid in the diagnosis of influenza from Nasopharyngeal swab specimens and should not be used as a sole basis for treatment. Nasal washings and aspirates are unacceptable for Xpert Xpress SARS-CoV-2/FLU/RSV testing.  Fact Sheet for Patients: BloggerCourse.com  Fact Sheet for Healthcare Providers: SeriousBroker.it  This test is not yet approved or cleared by the [place] FDA and has been authorized for detection and/or diagnosis of SARS-CoV-2 by FDA under an Emergency Use Authorization (EUA). This EUA will remain in effect (meaning this test can be used) for the duration of the COVID-19 declaration under Section 564(b)(1) of the Act, 21 U.S.C. section 360bbb-3(b)(1), unless the authorization is terminated or revoked.  Performed at [facility], [address] [place], [place] [address]      Discharge Instructions:   Discharge Instructions     Discharge instructions   Complete by: As directed    Soft diet Alcohol cessation   Increase activity slowly   Complete by: As directed       Allergies as of 07/06/2020   No Known Allergies      Medication List     STOP taking these medications    acetaminophen 325 MG tablet Commonly known as: TYLENOL   albuterol 108 (90 Base) MCG/ACT inhaler Commonly known as: VENTOLIN HFA   magnesium oxide 400 (241.3 Mg) MG tablet Commonly known as: MAG-OX   methocarbamol 500 MG tablet Commonly known as: ROBAXIN   methylPREDNISolone 4 MG Tbpk tablet Commonly known as: MEDROL DOSEPAK   potassium chloride 20 MEQ packet Commonly known as: KLOR-CON   predniSONE 20 MG tablet Commonly known as: DELTASONE       TAKE these medications    apixaban 5 MG Tabs tablet Commonly known as: ELIQUIS Take 1 tablet (5 mg total) by mouth 2 (two) times daily. Only start taking this script after you have completed 10mg  BID x 7 days What changed: Another  medication with the same name was removed. Continue taking this medication, and follow the directions you see here.   HYDROcodone-acetaminophen 5-325 MG tablet Commonly known as: NORCO/VICODIN Take 1 tablet by mouth every 6 (six) hours as needed for up to 3 days for severe pain.   metoprolol tartrate 25 MG tablet Commonly known as: LOPRESSOR Take 0.5 tablets (12.5 mg total) by mouth 2 (two) times daily. What changed: how much to take   multivitamin with minerals Tabs tablet Take 1 tablet by mouth daily.        Follow-up Information     Bender, , MD Follow up in 1 week(s).   Specialty: Family Medicine Contact information: [address] [place] [phone]                  Time coordinating discharge: 35 min  Signed:  [phone] DO  [facility] 07/06/2020, 12:19 PM

## 2020-07-06 NOTE — Progress Notes (Signed)
Patient given instructions regarding pancreatitis and alcohol disorder, medication education, IV taken out, & patient to make follow up appointment.

## 2020-09-16 ENCOUNTER — Encounter: Payer: Self-pay | Admitting: Ophthalmology

## 2020-09-21 NOTE — Anesthesia Preprocedure Evaluation (Addendum)
Anesthesia Evaluation  Patient identified by MRN, date of birth, ID band Patient awake    Reviewed: Allergy & Precautions, NPO status   Airway Mallampati: II  TM Distance: >3 FB     Dental  (+) Upper Dentures, Lower Dentures   Pulmonary Current SmokerPatient did not abstain from smoking.,    Pulmonary exam normal        Cardiovascular + CAD  + dysrhythmias (paroxysmal afib)  Rhythm:Regular Rate:Normal  Normal echo 10/2020.   Neuro/Psych ETOH abuse   GI/Hepatic Hx pancreatitis   Endo/Other    Renal/GU      Musculoskeletal   Abdominal   Peds  Hematology   Anesthesia Other Findings   Reproductive/Obstetrics                           Anesthesia Physical Anesthesia Plan  ASA: 3  Anesthesia Plan: MAC   Post-op Pain Management:    Induction: Intravenous  PONV Risk Score and Plan: TIVA, Treatment may vary due to age or medical condition and Midazolam  Airway Management Planned: Natural Airway and Nasal Cannula  Additional Equipment:   Intra-op Plan:   Post-operative Plan:   Informed Consent: I have reviewed the patients History and Physical, chart, labs and discussed the procedure including the risks, benefits and alternatives for the proposed anesthesia with the patient or authorized representative who has indicated his/her understanding and acceptance.       Plan Discussed with: CRNA  Anesthesia Plan Comments:         Anesthesia Quick Evaluation

## 2020-09-30 NOTE — Discharge Instructions (Signed)

## 2020-10-04 ENCOUNTER — Encounter
Admission: RE | Disposition: A | Discharge status: Home / Self Care | Payer: Self-pay | Source: Home / Self Care | Attending: Ophthalmology

## 2020-10-04 ENCOUNTER — Other Ambulatory Visit: Payer: Self-pay

## 2020-10-04 ENCOUNTER — Ambulatory Visit: Payer: Medicare Other | Admitting: Anesthesiology

## 2020-10-04 ENCOUNTER — Ambulatory Visit
Admission: RE | Admit: 2020-10-04 | Discharge: 2020-10-04 | Disposition: A | Discharge status: Home / Self Care | Payer: Medicare Other | Attending: Ophthalmology | Admitting: Ophthalmology

## 2020-10-04 ENCOUNTER — Encounter: Payer: Self-pay | Admitting: Ophthalmology

## 2020-10-04 DIAGNOSIS — Z8616 Personal history of COVID-19: Secondary | ICD-10-CM | POA: Diagnosis not present

## 2020-10-04 DIAGNOSIS — Z79899 Other long term (current) drug therapy: Secondary | ICD-10-CM | POA: Diagnosis not present

## 2020-10-04 DIAGNOSIS — I252 Old myocardial infarction: Secondary | ICD-10-CM | POA: Insufficient documentation

## 2020-10-04 DIAGNOSIS — I480 Paroxysmal atrial fibrillation: Secondary | ICD-10-CM | POA: Insufficient documentation

## 2020-10-04 DIAGNOSIS — F17210 Nicotine dependence, cigarettes, uncomplicated: Secondary | ICD-10-CM | POA: Diagnosis not present

## 2020-10-04 DIAGNOSIS — H2511 Age-related nuclear cataract, right eye: Secondary | ICD-10-CM | POA: Insufficient documentation

## 2020-10-04 DIAGNOSIS — Z7901 Long term (current) use of anticoagulants: Secondary | ICD-10-CM | POA: Insufficient documentation

## 2020-10-04 DIAGNOSIS — Z9049 Acquired absence of other specified parts of digestive tract: Secondary | ICD-10-CM | POA: Insufficient documentation

## 2020-10-04 HISTORY — PX: ANTERIOR VITRECTOMY: SHX1173

## 2020-10-04 HISTORY — PX: CATARACT EXTRACTION W/PHACO: SHX586

## 2020-10-04 SURGERY — PHACOEMULSIFICATION, CATARACT, WITH IOL INSERTION
Anesthesia: Monitor Anesthesia Care | Site: Eye | Laterality: Right

## 2020-10-04 MED ORDER — SIGHTPATH DOSE#1 BSS IO SOLN
INTRAOCULAR | Status: DC | PRN
  Administered 2020-10-04: 175 mL via OPHTHALMIC

## 2020-10-04 MED ORDER — TETRACAINE HCL 0.5 % OP SOLN
1.0000 [drp] | OPHTHALMIC | Status: DC | PRN
  Administered 2020-10-04 (×3): 1 [drp] via OPHTHALMIC

## 2020-10-04 MED ORDER — FENTANYL CITRATE (PF) 100 MCG/2ML IJ SOLN
INTRAMUSCULAR | Status: DC | PRN
  Administered 2020-10-04 (×2): 50 ug via INTRAVENOUS

## 2020-10-04 MED ORDER — MIDAZOLAM HCL 2 MG/2ML IJ SOLN
INTRAMUSCULAR | Status: DC | PRN
  Administered 2020-10-04: 2 mg via INTRAVENOUS
  Administered 2020-10-04 (×2): 1 mg via INTRAVENOUS

## 2020-10-04 MED ORDER — TRIAMCINOLONE ACETONIDE 40 MG/ML IJ SUSP
INTRAMUSCULAR | Status: DC | PRN
  Administered 2020-10-04: 40 mg

## 2020-10-04 MED ORDER — MOXIFLOXACIN HCL 0.5 % OP SOLN
OPHTHALMIC | Status: DC | PRN
  Administered 2020-10-04: 0.2 mL via OPHTHALMIC

## 2020-10-04 MED ORDER — NA CHONDROIT SULF-NA HYALURON 40-30 MG/ML IO SOSY
INTRAOCULAR | Status: DC | PRN
  Administered 2020-10-04: 0.5 mL via INTRAOCULAR

## 2020-10-04 MED ORDER — DIFLUPREDNATE 0.05 % OP EMUL: 1.0000 [drp] | Freq: Four times a day (QID) | OPHTHALMIC | 0 refills | Status: AC

## 2020-10-04 MED ORDER — SIGHTPATH DOSE#1 SODIUM HYALURONATE 10 MG/ML IO SOLUTION
PREFILLED_SYRINGE | INTRAOCULAR | Status: DC | PRN
  Administered 2020-10-04: 0.4 mL via INTRAOCULAR

## 2020-10-04 MED ORDER — BRIMONIDINE TARTRATE-TIMOLOL 0.2-0.5 % OP SOLN
OPHTHALMIC | Status: DC | PRN
  Administered 2020-10-04: 1 [drp] via OPHTHALMIC

## 2020-10-04 MED ORDER — SIGHTPATH DOSE#1 SODIUM HYALURONATE 23 MG/ML IO SOLUTION
PREFILLED_SYRINGE | INTRAOCULAR | Status: DC | PRN
  Administered 2020-10-04: 0.55 mL via INTRAOCULAR

## 2020-10-04 MED ORDER — ACETAMINOPHEN 160 MG/5ML PO SOLN: 325.0000 mg | ORAL | Status: DC | PRN

## 2020-10-04 MED ORDER — ONDANSETRON HCL 4 MG/2ML IJ SOLN: 4.0000 mg | Freq: Once | INTRAMUSCULAR | Status: DC | PRN

## 2020-10-04 MED ORDER — TRYPAN BLUE 0.06 % OP SOLN
OPHTHALMIC | Status: DC | PRN
  Administered 2020-10-04: 0.5 mL via INTRAOCULAR

## 2020-10-04 MED ORDER — LIDOCAINE HCL (PF) 2 % IJ SOLN
INTRAOCULAR | Status: DC | PRN
  Administered 2020-10-04: 4 mL via INTRAOCULAR

## 2020-10-04 MED ORDER — ACETAMINOPHEN 325 MG PO TABS: 325.0000 mg | ORAL_TABLET | ORAL | Status: DC | PRN

## 2020-10-04 MED ORDER — SIGHTPATH DOSE#1 BSS IO SOLN
INTRAOCULAR | Status: DC | PRN
  Administered 2020-10-04: 15 mL via INTRAOCULAR

## 2020-10-04 MED ORDER — ARMC OPHTHALMIC DILATING DROPS
1.0000 "application " | OPHTHALMIC | Status: DC | PRN
  Administered 2020-10-04 (×3): 1 via OPHTHALMIC

## 2020-10-04 SURGICAL SUPPLY — 16 items
CANNULA ANT/CHMB 27GA (MISCELLANEOUS) ×6 IMPLANT
DISSECTOR HYDRO NUCLEUS 50X22 (MISCELLANEOUS) ×2 IMPLANT
GLOVE SURG GAMMEX PI TX LF 7.5 (GLOVE) ×2 IMPLANT
GLOVE SURG SYN 8.5  E (GLOVE) ×2
GLOVE SURG SYN 8.5 E (GLOVE) ×1 IMPLANT
GOWN STRL REUS W/ TWL LRG LVL3 (GOWN DISPOSABLE) ×2 IMPLANT
GOWN STRL REUS W/TWL LRG LVL3 (GOWN DISPOSABLE) ×4
LENS IOL TECNIS EYHANCE 19.0 (Intraocular Lens) ×2 IMPLANT
MARKER SKIN DUAL TIP RULER LAB (MISCELLANEOUS) ×2 IMPLANT
PACK EYE AFTER SURG (MISCELLANEOUS) ×2 IMPLANT
PACK VIT ANT 23G (MISCELLANEOUS) ×2 IMPLANT
RING CAPSULAR TYPE 14A PL RGHT (Miscellaneous) ×2 IMPLANT
SYR 3ML LL SCALE MARK (SYRINGE) ×2 IMPLANT
SYR TB 1ML LUER SLIP (SYRINGE) ×4 IMPLANT
WATER STERILE IRR 250ML POUR (IV SOLUTION) ×2 IMPLANT
WIPE NON LINTING 3.25X3.25 (MISCELLANEOUS) ×2 IMPLANT

## 2020-10-04 NOTE — Anesthesia Procedure Notes (Signed)
Procedure Name: MAC Date/Time: 10/04/2020 9:13 AM Performed by: Jeannene Patella, CRNA Pre-anesthesia Checklist: Patient identified, Emergency Drugs available, Suction available, Timeout performed and Patient being monitored Patient Re-evaluated:Patient Re-evaluated prior to induction Oxygen Delivery Method: Nasal cannula Placement Confirmation: positive ETCO2

## 2020-10-04 NOTE — Anesthesia Postprocedure Evaluation (Signed)
Anesthesia Post Note  Patient: Thomas Coffey  Procedure(s) Performed: CATARACT EXTRACTION PHACO AND INTRAOCULAR LENS PLACEMENT (IOC) RIGHT VISION BLUE, CAPSULAR TENSION RING 14A  RIGHT 21.84 01:44.7 (Right: Eye) ANTERIOR VITRECTOMY (Right: Eye)     Patient location during evaluation: PACU Anesthesia Type: MAC Level of consciousness: awake Pain management: pain level controlled Vital Signs Assessment: post-procedure vital signs reviewed and stable Respiratory status: respiratory function stable Cardiovascular status: stable Postop Assessment: no apparent nausea or vomiting Anesthetic complications: no   No notable events documented.  Veda Canning

## 2020-10-04 NOTE — H&P (Signed)
[facility]   Primary Care Physician:  Oswaldo Conroy, MD Ophthalmologist: Dr. Willey Blade  Pre-Procedure History & Physical: HPI:  Thomas Coffey is a 65 y.o. male here for cataract surgery.   Past Medical History:  Diagnosis Date   Acute pancreatitis    COVID-19 virus infection    positive COVID-19 test on 01/22/20   MI (myocardial infarction) (HCC)    Paroxysmal atrial fibrillation (HCC)     Past Surgical History:  Procedure Laterality Date   PARTIAL COLECTOMY      Prior to Admission medications   Medication Sig Start Date End Date Taking? Authorizing Provider  apixaban (ELIQUIS) 5 MG TABS tablet Take 1 tablet (5 mg total) by mouth 2 (two) times daily. Only start taking this script after you have completed 10mg  BID x 7 days 06/10/20 09/16/20 Yes 11/16/20, MD  metoprolol tartrate (LOPRESSOR) 25 MG tablet Take 0.5 tablets (12.5 mg total) by mouth 2 (two) times daily. 07/06/20  Yes 07/08/20 U, DO  potassium chloride (KLOR-CON) 10 MEQ tablet Take 10 mEq by mouth 2 (two) times daily.   Yes [provider]  Multiple Vitamin (MULTIVITAMIN WITH MINERALS) TABS tablet Take 1 tablet by mouth daily. 02/26/20   04/25/20, MD    Allergies as of 08/26/2020   (No Known Allergies)    Family History  Problem Relation Age of Onset   Cancer Mother    Kidney disease Brother     Social History   Socioeconomic History   Marital status: Married    Spouse name: Not on file   Number of children: Not on file   Years of education: Not on file   Highest education level: Not on file  Occupational History   Not on file  Tobacco Use   Smoking status: Every Day    Packs/day: 1.00    Years: 30.00    Pack years: 30.00    Types: Cigarettes   Smokeless tobacco: Never  Substance and Sexual Activity   Alcohol use: Yes    Alcohol/week: 6.0 standard drinks    Types: 6 Cans of beer per week    Comment: daily   Drug use: Yes    Types: Marijuana   Sexual  activity: Not on file  Other Topics Concern   Not on file  Social History Narrative   Not on file   Social Determinants of Health   Financial Resource Strain: Not on file  Food Insecurity: Not on file  Transportation Needs: Not on file  Physical Activity: Not on file  Stress: Not on file  Social Connections: Not on file  Intimate Partner Violence: Not on file    Review of Systems: See HPI, otherwise negative ROS  Physical Exam: BP (!) 145/93   Pulse 84   Temp 98.1 F (36.7 C)   Ht 5\' 9"  (1.753 m)   Wt 68 kg   SpO2 99%   BMI 22.15 kg/m  General:   Alert, cooperative in NAD Head:  Normocephalic and atraumatic. Respiratory:  Normal work of breathing. Cardiovascular:  RRR  Impression/Plan: 10/26/2020 is here for cataract surgery.  Risks, benefits, limitations, and alternatives regarding cataract surgery have been reviewed with the patient.  Questions have been answered.  All parties agreeable.   , MD  10/04/2020, 9:02 AM

## 2020-10-04 NOTE — Transfer of Care (Signed)
Immediate Anesthesia Transfer of Care Note  Patient: Thomas Coffey  Procedure(s) Performed: CATARACT EXTRACTION PHACO AND INTRAOCULAR LENS PLACEMENT (IOC) RIGHT VISION BLUE, CAPSULAR TENSION RING 14A  RIGHT 21.84 01:44.7 (Right: Eye) ANTERIOR VITRECTOMY (Right: Eye)  Patient Location: PACU  Anesthesia Type: MAC  Level of Consciousness: awake, alert  and patient cooperative  Airway and Oxygen Therapy: Patient Spontanous Breathing and Patient connected to supplemental oxygen  Post-op Assessment: Post-op Vital signs reviewed, Patient's Cardiovascular Status Stable, Respiratory Function Stable, Patent Airway and No signs of Nausea or vomiting  Post-op Vital Signs: Reviewed and stable  Complications: No notable events documented.

## 2020-10-04 NOTE — Op Note (Signed)
OPERATIVE NOTE  Thomas Coffey [id number] 10/04/2020   PREOPERATIVE DIAGNOSIS:    Nuclear sclerotic cataract right eye.  H25.11 History of trauma, right eye.   POSTOPERATIVE DIAGNOSIS:      Nuclear sclerotic cataract right eye.   History of trauma, right eye. Zonular weakness, right eye.   PROCEDURE:    CPT [phone] Complex Phacoemusification with posterior chamber intraocular lens placement of the right eye  including use of vision blue and placement of capsular tension ring. CPT [id number] Anterior vitrectomy, right eye.  LENS:   Implant Name Type Inv. Item Serial No. Manufacturer Lot No. LRB No. Used Action  LENS IOL TECNIS EYHANCE 19.0 - [id number] Intraocular Lens LENS IOL TECNIS EYHANCE 19.0 [id number] JOHNSON   Right 1 Implanted  RING CAPSULAR TYPE 14A PL RGHT - [id number] Miscellaneous RING CAPSULAR TYPE 14A PL RGHT [id number] [employer] [id number] Right 1 Implanted       Procedure(s): CATARACT EXTRACTION PHACO AND INTRAOCULAR LENS PLACEMENT (IOC) RIGHT VISION BLUE, CAPSULAR TENSION RING 14A  RIGHT 21.84 01:44.7 (Right) ANTERIOR VITRECTOMY (Right)  DIB00 +19.0   ULTRASOUND TIME: 1 minutes 44 seconds.  CDE 21.84   SURGEON:  Willey Blade, MD, MPH  ANESTHESIOLOGIST: Anesthesiologist: Jola Babinski, MD CRNA: Jinny Blossom, CRNA   ANESTHESIA:  Topical with tetracaine drops augmented with 1% preservative-free intracameral lidocaine.  ESTIMATED BLOOD LOSS: less than 1 mL.   COMPLICATIONS:  vitreous loss right eye, zonular dehisence, right eye..   DESCRIPTION OF PROCEDURE:  The patient was identified in the holding room and transported to the operating room and placed in the supine position under the operating microscope.  The right eye was identified as the operative eye and it was prepped and draped in the usual sterile ophthalmic fashion.   A 1.0 millimeter clear-corneal paracentesis was made at the 10:30 position. 0.5 ml of preservative-free 1% lidocaine with epinephrine  was injected into the anterior chamber.  There was a poor red reflex, so Trypan blue was instilled under an air bubble and rinsed out with BSS to stain the capsule for improved visualization.    The anterior chamber was filled with Healon 5 viscoelastic.  A 2.4 millimeter keratome was used to make a near-clear corneal incision at the 8:00 position.  A curvilinear capsulorrhexis was made with a cystotome and capsulorrhexis forceps.  Balanced salt solution was used to hydrodissect and hydrodelineate the nucleus.   Phacoemulsification was then used in stop and chop fashion to remove the lens nucleus and epinucleus.  The lens was very dense and significant phaco energy was used to minimize lateral forces on the zonule. Chopping manueuvers were used to minimize phaco energy.  A zonular dehiscence nasally abotu 3 clock hours 1:30 - 4:30 was noted about 1/2 way through phaco.  A capsular tension ring was placed.   Most of the the remaining cortex was then removed using the irrigation and aspiration handpiece, but there was some subincisional and at 10:00 that remained trapped behind the capsular tension ring.[name] Healon was then placed into the capsular bag to distend it for lens placement.  A lens was then injected into the capsular bag.  The remaining viscoelastic was aspirated but at this time a small strand of vitreous was noted coming to the I/A tip.    This was tagged with intrcameral kenalog and a limited anterior vitrectomy was performed. There was no vitreous to the wound.  The lens was well centered and the anterior capsule was round and centered.  There  was no posterior capsular tears.   Wounds were hydrated with balanced salt solution.  The anterior chamber was inflated to a physiologic pressure with balanced salt solution.   Intracameral vigamox 0.1 mL undiluted was injected into the eye and a drop placed onto the ocular surface.  Combigan was placed on the ocular surface.    No wound leaks  were noted.  The patient was taken to the recovery room in stable condition without complications of anesthesia or surgery  Willey Blade 10/04/2020, 9:57 AM

## 2020-10-05 ENCOUNTER — Other Ambulatory Visit: Payer: Self-pay

## 2020-10-05 ENCOUNTER — Encounter: Payer: Self-pay | Admitting: Ophthalmology

## 2020-10-14 NOTE — Discharge Instructions (Signed)

## 2020-10-18 ENCOUNTER — Other Ambulatory Visit: Payer: Self-pay

## 2020-10-18 ENCOUNTER — Encounter: Payer: Self-pay | Admitting: Ophthalmology

## 2020-10-18 ENCOUNTER — Ambulatory Visit: Payer: Medicare Other | Admitting: Anesthesiology

## 2020-10-18 ENCOUNTER — Ambulatory Visit
Admission: RE | Admit: 2020-10-18 | Discharge: 2020-10-18 | Disposition: A | Discharge status: Home / Self Care | Payer: Medicare Other | Attending: Ophthalmology | Admitting: Ophthalmology

## 2020-10-18 ENCOUNTER — Encounter
Admission: RE | Disposition: A | Discharge status: Home / Self Care | Payer: Self-pay | Source: Home / Self Care | Attending: Ophthalmology

## 2020-10-18 DIAGNOSIS — Z8616 Personal history of COVID-19: Secondary | ICD-10-CM | POA: Diagnosis not present

## 2020-10-18 DIAGNOSIS — F17210 Nicotine dependence, cigarettes, uncomplicated: Secondary | ICD-10-CM | POA: Insufficient documentation

## 2020-10-18 DIAGNOSIS — H2512 Age-related nuclear cataract, left eye: Secondary | ICD-10-CM | POA: Diagnosis not present

## 2020-10-18 DIAGNOSIS — Z79899 Other long term (current) drug therapy: Secondary | ICD-10-CM | POA: Insufficient documentation

## 2020-10-18 DIAGNOSIS — Z7901 Long term (current) use of anticoagulants: Secondary | ICD-10-CM | POA: Diagnosis not present

## 2020-10-18 HISTORY — PX: CATARACT EXTRACTION W/PHACO: SHX586

## 2020-10-18 SURGERY — PHACOEMULSIFICATION, CATARACT, WITH IOL INSERTION
Anesthesia: Monitor Anesthesia Care | Site: Eye | Laterality: Left

## 2020-10-18 MED ORDER — SIGHTPATH DOSE#1 SODIUM HYALURONATE 10 MG/ML IO SOLUTION
PREFILLED_SYRINGE | INTRAOCULAR | Status: DC | PRN
  Administered 2020-10-18: 0.55 mL via INTRAOCULAR

## 2020-10-18 MED ORDER — MIDAZOLAM HCL 2 MG/2ML IJ SOLN
INTRAMUSCULAR | Status: DC | PRN
  Administered 2020-10-18: 2 mg via INTRAVENOUS
  Administered 2020-10-18 (×2): 1 mg via INTRAVENOUS

## 2020-10-18 MED ORDER — SIGHTPATH DOSE#1 BSS IO SOLN
INTRAOCULAR | Status: DC | PRN
  Administered 2020-10-18: 67 mL via OPHTHALMIC

## 2020-10-18 MED ORDER — MOXIFLOXACIN HCL 0.5 % OP SOLN
OPHTHALMIC | Status: DC | PRN
  Administered 2020-10-18: 0.2 mL via OPHTHALMIC

## 2020-10-18 MED ORDER — LACTATED RINGERS IV SOLN: INTRAVENOUS | Status: DC

## 2020-10-18 MED ORDER — FENTANYL CITRATE (PF) 100 MCG/2ML IJ SOLN
INTRAMUSCULAR | Status: DC | PRN
  Administered 2020-10-18 (×2): 50 ug via INTRAVENOUS

## 2020-10-18 MED ORDER — SIGHTPATH DOSE#1 BSS IO SOLN
INTRAOCULAR | Status: DC | PRN
  Administered 2020-10-18: 15 mL via INTRAOCULAR

## 2020-10-18 MED ORDER — SIGHTPATH DOSE#1 SODIUM HYALURONATE 23 MG/ML IO SOLUTION
PREFILLED_SYRINGE | INTRAOCULAR | Status: DC | PRN
  Administered 2020-10-18: 0.55 mL via INTRAOCULAR

## 2020-10-18 MED ORDER — TETRACAINE HCL 0.5 % OP SOLN
1.0000 [drp] | OPHTHALMIC | Status: DC | PRN
  Administered 2020-10-18 (×3): 1 [drp] via OPHTHALMIC

## 2020-10-18 MED ORDER — ARMC OPHTHALMIC DILATING DROPS
1.0000 "application " | OPHTHALMIC | Status: DC | PRN
  Administered 2020-10-18 (×3): 1 via OPHTHALMIC

## 2020-10-18 MED ORDER — LIDOCAINE HCL (PF) 2 % IJ SOLN
INTRAOCULAR | Status: DC | PRN
  Administered 2020-10-18: 2 mL via INTRAOCULAR

## 2020-10-18 SURGICAL SUPPLY — 14 items
CANNULA ANT/CHMB 27GA (MISCELLANEOUS) IMPLANT
DISSECTOR HYDRO NUCLEUS 50X22 (MISCELLANEOUS) ×2 IMPLANT
GLOVE SURG GAMMEX PI TX LF 7.5 (GLOVE) ×2 IMPLANT
GLOVE SURG SYN 8.5  E (GLOVE) ×2
GLOVE SURG SYN 8.5 E (GLOVE) ×1 IMPLANT
GOWN STRL REUS W/ TWL LRG LVL3 (GOWN DISPOSABLE) ×2 IMPLANT
GOWN STRL REUS W/TWL LRG LVL3 (GOWN DISPOSABLE) ×4
LENS IOL TECNIS EYHANCE 18.5 (Intraocular Lens) ×2 IMPLANT
MARKER SKIN DUAL TIP RULER LAB (MISCELLANEOUS) ×2 IMPLANT
PACK EYE AFTER SURG (MISCELLANEOUS) IMPLANT
SYR 3ML LL SCALE MARK (SYRINGE) ×2 IMPLANT
SYR TB 1ML LUER SLIP (SYRINGE) ×2 IMPLANT
WATER STERILE IRR 250ML POUR (IV SOLUTION) ×2 IMPLANT
WIPE NON LINTING 3.25X3.25 (MISCELLANEOUS) ×2 IMPLANT

## 2020-10-18 NOTE — Anesthesia Preprocedure Evaluation (Signed)
Anesthesia Evaluation  Patient identified by MRN, date of birth, ID band Patient awake    History of Anesthesia Complications Negative for: history of anesthetic complications  Airway Mallampati: II  TM Distance: >3 FB     Dental  (+) Upper Dentures, Lower Dentures   Pulmonary Current Smoker,    Pulmonary exam normal        Cardiovascular Exercise Tolerance: Good + Past MI (about 1 year ago, no stenting)  Normal cardiovascular exam     Neuro/Psych negative neurological ROS     GI/Hepatic (+)     substance abuse  alcohol use, Non-occlusive portal vein thrombosis, on Eliquis   Endo/Other    Renal/GU      Musculoskeletal   Abdominal   Peds  Hematology   Anesthesia Other Findings   Reproductive/Obstetrics                            Anesthesia Physical Anesthesia Plan  ASA: 3  Anesthesia Plan: MAC   Post-op Pain Management:    Induction: Intravenous  PONV Risk Score and Plan: 0 and Midazolam, TIVA and Treatment may vary due to age or medical condition  Airway Management Planned: Nasal Cannula and Natural Airway  Additional Equipment: None  Intra-op Plan:   Post-operative Plan:   Informed Consent: I have reviewed the patients History and Physical, chart, labs and discussed the procedure including the risks, benefits and alternatives for the proposed anesthesia with the patient or authorized representative who has indicated his/her understanding and acceptance.       Plan Discussed with: CRNA  Anesthesia Plan Comments:         Anesthesia Quick Evaluation

## 2020-10-18 NOTE — Anesthesia Postprocedure Evaluation (Signed)
Anesthesia Post Note  Patient: Thomas Coffey  Procedure(s) Performed: CATARACT EXTRACTION PHACO AND INTRAOCULAR LENS PLACEMENT (IOC) LEFT 2.95 00:23.9 (Left: Eye)     Patient location during evaluation: PACU Anesthesia Type: MAC Level of consciousness: awake and alert Pain management: pain level controlled Vital Signs Assessment: post-procedure vital signs reviewed and stable Respiratory status: spontaneous breathing Cardiovascular status: blood pressure returned to baseline Postop Assessment: no apparent nausea or vomiting, adequate PO intake and no headache Anesthetic complications: no   No notable events documented.  Adele Barthel Lorin Gawron

## 2020-10-18 NOTE — H&P (Signed)
[facility]   Primary Care Physician:  Oswaldo Conroy, MD Ophthalmologist: Dr. Willey Blade  Pre-Procedure History & Physical: HPI:  Thomas Coffey is a 65 y.o. male here for cataract surgery.   Past Medical History:  Diagnosis Date   Acute pancreatitis    COVID-19 virus infection    positive COVID-19 test on 01/22/20   MI (myocardial infarction) (HCC)    Paroxysmal atrial fibrillation [facility])     Past Surgical History:  Procedure Laterality Date   ANTERIOR VITRECTOMY Right 10/04/2020   Procedure: ANTERIOR VITRECTOMY;  Surgeon: Nevada Crane, MD;  Location: [facility];  Service: Ophthalmology;  Laterality: Right;   CATARACT EXTRACTION W/PHACO Right 10/04/2020   Procedure: CATARACT EXTRACTION PHACO AND INTRAOCULAR LENS PLACEMENT (IOC) RIGHT VISION BLUE, CAPSULAR TENSION RING 14A  RIGHT 21.84 01:44.7;  Surgeon: Nevada Crane, MD;  Location: [facility];  Service: Ophthalmology;  Laterality: Right;   PARTIAL COLECTOMY      Prior to Admission medications   Medication Sig Start Date End Date Taking? Authorizing Provider  Difluprednate (DUREZOL) 0.05 % EMUL Place 1 drop into the right eye 4 (four) times daily. 10/04/20 11/03/20 Yes Nevada Crane, MD  metoprolol tartrate (LOPRESSOR) 25 MG tablet Take 0.5 tablets (12.5 mg total) by mouth 2 (two) times daily. 07/06/20  Yes Joseph Art, DO  Multiple Vitamin (MULTIVITAMIN WITH MINERALS) TABS tablet Take 1 tablet by mouth daily. 02/26/20  Yes Enedina Finner, MD  potassium chloride (KLOR-CON) 10 MEQ tablet Take 10 mEq by mouth 2 (two) times daily.   Yes [provider]  apixaban (ELIQUIS) 5 MG TABS tablet Take 1 tablet (5 mg total) by mouth 2 (two) times daily. Only start taking this script after you have completed 10mg  BID x 7 days 06/10/20 09/16/20  11/16/20, MD    Allergies as of 08/26/2020   (No Known Allergies)    Family History  Problem Relation Age of Onset   Cancer Mother     Kidney disease Brother     Social History   Socioeconomic History   Marital status: Married    Spouse name: Not on file   Number of children: Not on file   Years of education: Not on file   Highest education level: Not on file  Occupational History   Not on file  Tobacco Use   Smoking status: Every Day    Packs/day: 1.00    Years: 30.00    Pack years: 30.00    Types: Cigarettes   Smokeless tobacco: Never  Substance and Sexual Activity   Alcohol use: Yes    Alcohol/week: 6.0 standard drinks    Types: 6 Cans of beer per week    Comment: daily   Drug use: Yes    Types: Marijuana   Sexual activity: Not on file  Other Topics Concern   Not on file  Social History Narrative   Not on file   Social Determinants of Health   Financial Resource Strain: Not on file  Food Insecurity: Not on file  Transportation Needs: Not on file  Physical Activity: Not on file  Stress: Not on file  Social Connections: Not on file  Intimate Partner Violence: Not on file    Review of Systems: See HPI, otherwise negative ROS  Physical Exam: BP (!) 152/84   Pulse 78   Temp (!) 97.5 F (36.4 C) (Temporal)   Resp 18   Ht 5\' 9"  (1.753 m)  Wt 68.5 kg   SpO2 99%   BMI 22.30 kg/m  General:   Alert, cooperative in NAD Head:  Normocephalic and atraumatic. Respiratory:  Normal work of breathing. Cardiovascular:  RRR  Impression/Plan: Thomas Coffey is here for cataract surgery.  Risks, benefits, limitations, and alternatives regarding cataract surgery have been reviewed with the patient.  Questions have been answered.  All parties agreeable.   Willey Blade, MD  10/18/2020, 10:04 AM

## 2020-10-18 NOTE — Transfer of Care (Signed)
Immediate Anesthesia Transfer of Care Note  Patient: Thomas Coffey  Procedure(s) Performed: CATARACT EXTRACTION PHACO AND INTRAOCULAR LENS PLACEMENT (IOC) LEFT 2.95 00:23.9 (Left: Eye)  Patient Location: PACU  Anesthesia Type: MAC  Level of Consciousness: awake, alert  and patient cooperative  Airway and Oxygen Therapy: Patient Spontanous Breathing and Patient connected to supplemental oxygen  Post-op Assessment: Post-op Vital signs reviewed, Patient's Cardiovascular Status Stable, Respiratory Function Stable, Patent Airway and No signs of Nausea or vomiting  Post-op Vital Signs: Reviewed and stable  Complications: No notable events documented.

## 2020-10-18 NOTE — Op Note (Signed)
OPERATIVE NOTE  Thomas Coffey [id number] 10/18/2020   PREOPERATIVE DIAGNOSIS:  Nuclear sclerotic cataract left eye.  H25.12   POSTOPERATIVE DIAGNOSIS:    Nuclear sclerotic cataract left eye.     PROCEDURE:  Phacoemusification with posterior chamber intraocular lens placement of the left eye   LENS:   Implant Name Type Inv. Item Serial No. Manufacturer Lot No. LRB No. Used Action  LENS IOL TECNIS EYHANCE 18.5 - [id number] Intraocular Lens LENS IOL TECNIS EYHANCE 18.5 [id number] JOHNSON   Left 1 Implanted      Procedure(s): CATARACT EXTRACTION PHACO AND INTRAOCULAR LENS PLACEMENT (IOC) LEFT 2.95 00:23.9 (Left)  DIB00 +18.5   ULTRASOUND TIME: 0 minutes 23 seconds.  CDE 2.95   SURGEON:  Willey Blade, MD, MPH   ANESTHESIA:  Topical with tetracaine drops augmented with 1% preservative-free intracameral lidocaine.  ESTIMATED BLOOD LOSS: <1 mL   COMPLICATIONS:  None.   DESCRIPTION OF PROCEDURE:  The patient was identified in the holding room and transported to the operating room and placed in the supine position under the operating microscope.  The left eye was identified as the operative eye and it was prepped and draped in the usual sterile ophthalmic fashion.   A 1.0 millimeter clear-corneal paracentesis was made at the 5:00 position. 0.5 ml of preservative-free 1% lidocaine with epinephrine was injected into the anterior chamber.  The anterior chamber was filled with Healon 5 viscoelastic.  A 2.4 millimeter keratome was used to make a near-clear corneal incision at the 2:00 position.  A curvilinear capsulorrhexis was made with a cystotome and capsulorrhexis forceps.  Balanced salt solution was used to hydrodissect and hydrodelineate the nucleus.   Phacoemulsification was then used in stop and chop fashion to remove the lens nucleus and epinucleus.  The remaining cortex was then removed using the irrigation and aspiration handpiece. Healon was then placed into the capsular bag to  distend it for lens placement.  A lens was then injected into the capsular bag.  The remaining viscoelastic was aspirated.   Wounds were hydrated with balanced salt solution.  The anterior chamber was inflated to a physiologic pressure with balanced salt solution.  Intracameral vigamox 0.1 mL undiltued was injected into the eye and a drop placed onto the ocular surface.  No wound leaks were noted.  The patient was taken to the recovery room in stable condition without complications of anesthesia or surgery  Willey Blade 10/18/2020, 10:29 AM

## 2020-10-18 NOTE — Anesthesia Procedure Notes (Signed)
Procedure Name: MAC Date/Time: 10/18/2020 10:09 AM Performed by: Cameron Ali, CRNA Pre-anesthesia Checklist: Patient identified, Emergency Drugs available, Suction available, Timeout performed and Patient being monitored Patient Re-evaluated:Patient Re-evaluated prior to induction Oxygen Delivery Method: Nasal cannula Placement Confirmation: positive ETCO2

## 2020-10-19 ENCOUNTER — Encounter: Payer: Self-pay | Admitting: Ophthalmology

## 2020-11-11 ENCOUNTER — Other Ambulatory Visit: Payer: Self-pay

## 2020-11-11 ENCOUNTER — Emergency Department
Admission: EM | Admit: 2020-11-11 | Discharge: 2020-11-12 | Disposition: A | Discharge status: Home / Self Care | Payer: Medicare Other | Attending: Emergency Medicine | Admitting: Emergency Medicine

## 2020-11-11 ENCOUNTER — Encounter: Payer: Self-pay | Admitting: *Deleted

## 2020-11-11 DIAGNOSIS — Z8616 Personal history of COVID-19: Secondary | ICD-10-CM | POA: Insufficient documentation

## 2020-11-11 DIAGNOSIS — G8929 Other chronic pain: Secondary | ICD-10-CM | POA: Diagnosis not present

## 2020-11-11 DIAGNOSIS — Z79899 Other long term (current) drug therapy: Secondary | ICD-10-CM | POA: Insufficient documentation

## 2020-11-11 DIAGNOSIS — F17210 Nicotine dependence, cigarettes, uncomplicated: Secondary | ICD-10-CM | POA: Diagnosis not present

## 2020-11-11 DIAGNOSIS — I119 Hypertensive heart disease without heart failure: Secondary | ICD-10-CM | POA: Diagnosis not present

## 2020-11-11 DIAGNOSIS — I2510 Atherosclerotic heart disease of native coronary artery without angina pectoris: Secondary | ICD-10-CM | POA: Insufficient documentation

## 2020-11-11 DIAGNOSIS — Z20822 Contact with and (suspected) exposure to covid-19: Secondary | ICD-10-CM | POA: Diagnosis not present

## 2020-11-11 DIAGNOSIS — Z7901 Long term (current) use of anticoagulants: Secondary | ICD-10-CM | POA: Diagnosis not present

## 2020-11-11 DIAGNOSIS — R209 Unspecified disturbances of skin sensation: Secondary | ICD-10-CM | POA: Diagnosis not present

## 2020-11-11 DIAGNOSIS — M545 Low back pain, unspecified: Secondary | ICD-10-CM | POA: Diagnosis present

## 2020-11-11 DIAGNOSIS — R059 Cough, unspecified: Secondary | ICD-10-CM | POA: Insufficient documentation

## 2020-11-11 DIAGNOSIS — M546 Pain in thoracic spine: Secondary | ICD-10-CM | POA: Diagnosis not present

## 2020-11-11 DIAGNOSIS — I480 Paroxysmal atrial fibrillation: Secondary | ICD-10-CM | POA: Insufficient documentation

## 2020-11-11 NOTE — ED Triage Notes (Signed)
Pt has back pin.  Pt treated at the pain clinic.  Pt reports pain meds are not helping.  Pt ambulatory to triage.  Pt alert  speech clear.

## 2020-11-12 ENCOUNTER — Emergency Department: Payer: Medicare Other

## 2020-11-12 LAB — URINALYSIS, ROUTINE W REFLEX MICROSCOPIC
Bacteria, UA: NONE SEEN
Bilirubin Urine: NEGATIVE
Glucose, UA: NEGATIVE mg/dL
Hgb urine dipstick: NEGATIVE
Ketones, ur: NEGATIVE mg/dL
Leukocytes,Ua: NEGATIVE
Nitrite: NEGATIVE
Protein, ur: 30 mg/dL — AB
Specific Gravity, Urine: 1.041 — ABNORMAL HIGH (ref 1.005–1.030)
pH: 5 (ref 5.0–8.0)

## 2020-11-12 LAB — COMPREHENSIVE METABOLIC PANEL
ALT: 30 U/L (ref 0–44)
AST: 79 U/L — ABNORMAL HIGH (ref 15–41)
Albumin: 3.4 g/dL — ABNORMAL LOW (ref 3.5–5.0)
Alkaline Phosphatase: 136 U/L — ABNORMAL HIGH (ref 38–126)
Anion gap: 11 (ref 5–15)
BUN: 9 mg/dL (ref 8–23)
CO2: 23 mmol/L (ref 22–32)
Calcium: 8.8 mg/dL — ABNORMAL LOW (ref 8.9–10.3)
Chloride: 105 mmol/L (ref 98–111)
Creatinine, Ser: 0.63 mg/dL (ref 0.61–1.24)
GFR, Estimated: >60 mL/min (ref >60)
Glucose, Bld: 86 mg/dL (ref 70–99)
Potassium: 3.8 mmol/L (ref 3.5–5.1)
Sodium: 139 mmol/L (ref 135–145)
Total Bilirubin: 0.9 mg/dL (ref 0.3–1.2)
Total Protein: 7.2 g/dL (ref 6.5–8.1)

## 2020-11-12 LAB — CBC WITH DIFFERENTIAL/PLATELET
Abs Immature Granulocytes: 0.03 10*3/uL (ref 0.00–0.07)
Basophils Absolute: 0 10*3/uL (ref 0.0–0.1)
Basophils Relative: 0 %
Eosinophils Absolute: 0 10*3/uL (ref 0.0–0.5)
Eosinophils Relative: 0 %
HCT: 38.4 % — ABNORMAL LOW (ref 39.0–52.0)
Hemoglobin: 13.5 g/dL (ref 13.0–17.0)
Immature Granulocytes: 0 %
Lymphocytes Relative: 16 %
Lymphs Abs: 1.8 10*3/uL (ref 0.7–4.0)
MCH: 32.5 pg (ref 26.0–34.0)
MCHC: 35.2 g/dL (ref 30.0–36.0)
MCV: 92.3 fL (ref 80.0–100.0)
Monocytes Absolute: 0.8 10*3/uL (ref 0.1–1.0)
Monocytes Relative: 7 %
Neutro Abs: 8.4 10*3/uL — ABNORMAL HIGH (ref 1.7–7.7)
Neutrophils Relative %: 77 %
Platelets: 185 10*3/uL (ref 150–400)
RBC: 4.16 MIL/uL — ABNORMAL LOW (ref 4.22–5.81)
RDW: 16.3 % — ABNORMAL HIGH (ref 11.5–15.5)
WBC: 11.1 10*3/uL — ABNORMAL HIGH (ref 4.0–10.5)
nRBC: 0 % (ref 0.0–0.2)

## 2020-11-12 LAB — RESP PANEL BY RT-PCR (FLU A&B, COVID) ARPGX2
Influenza A by PCR: NEGATIVE
Influenza B by PCR: NEGATIVE
SARS Coronavirus 2 by RT PCR: NEGATIVE

## 2020-11-12 LAB — LACTIC ACID, PLASMA: Lactic Acid, Venous: 1.9 mmol/L (ref 0.5–1.9)

## 2020-11-12 LAB — ETHANOL: Alcohol, Ethyl (B): 137 mg/dL — ABNORMAL HIGH (ref <10)

## 2020-11-12 LAB — PROCALCITONIN: Procalcitonin: <0.1 ng/mL

## 2020-11-12 MED ORDER — MORPHINE SULFATE (PF) 4 MG/ML IV SOLN
4.0000 mg | Freq: Once | INTRAVENOUS | Status: AC
  Administered 2020-11-12: 4 mg via INTRAVENOUS
  Filled 2020-11-12: qty 1

## 2020-11-12 MED ORDER — GADOBUTROL 1 MMOL/ML IV SOLN
7.5000 mL | Freq: Once | INTRAVENOUS | Status: AC | PRN
  Administered 2020-11-12: 7.5 mL via INTRAVENOUS
  Filled 2020-11-12: qty 7.5

## 2020-11-12 MED ORDER — TRAMADOL HCL 50 MG PO TABS: 50.0000 mg | ORAL_TABLET | Freq: Four times a day (QID) | ORAL | 0 refills | Status: DC | PRN

## 2020-11-12 MED ORDER — ONDANSETRON HCL 4 MG/2ML IJ SOLN
4.0000 mg | Freq: Once | INTRAMUSCULAR | Status: AC
  Administered 2020-11-12: 4 mg via INTRAVENOUS
  Filled 2020-11-12: qty 2

## 2020-11-12 NOTE — ED Provider Notes (Signed)
[facility] Emergency Department Provider Note  ____________________________________________   Event Date/Time   First MD Initiated Contact with Patient 11/11/20 2328     (approximate)  I have reviewed the triage vital signs and the nursing notes.   HISTORY  Chief Complaint Back Pain    HPI Thomas Coffey is a 65 y.o. male with history of atrial fibrillation, CAD, alcohol abuse who presents to the emergency department with complaints of thoracic and lumbar back pain that has been progressively worsening since he received an epidural injection about 2 weeks ago.  He denies any previous back surgeries.  He states he has having numbness going down his right leg and has now also felt it in the left leg.  No weakness.  No bowel or bladder incontinence.  No urinary retention.  Reports he has had subjective fevers at home.  Also reports he has had a productive cough.  No chest pain, current shortness of breath.  No vomiting or diarrhea.  Does have history of tobacco use.        Past Medical History:  Diagnosis Date   Acute pancreatitis    COVID-19 virus infection    positive COVID-19 test on 01/22/20   MI (myocardial infarction) (HCC)    Paroxysmal atrial fibrillation [facility])     Patient Active Problem List   Diagnosis Date Noted   Abdominal pain 06/02/2020   Transaminitis 06/02/2020   Leukocytosis 06/02/2020   AF (paroxysmal atrial fibrillation) (HCC) 06/02/2020   Portal vein thrombosis 06/01/2020   Acute metabolic encephalopathy    Failure to thrive in adult 02/13/2020   COVID-19 01/29/2020   COVID-19 virus infection 01/28/2020   Abnormal LFTs 01/28/2020   CAD (coronary artery disease) 01/28/2020   Atrial fibrillation, chronic (HCC) 01/28/2020   Acute respiratory disease due to COVID-19 virus 01/28/2020   Tobacco abuse 01/28/2020   Acute pancreatitis 07/20/2019   Hypomagnesemia 11/24/2018   Protein-calorie malnutrition, severe 11/23/2018    Pleural effusion on left    Liver function test abnormality    AKI (acute kidney injury) (HCC)    Atrial fibrillation with rapid ventricular response (HCC)    Acute hepatitis 11/17/2018   Atrial fibrillation with RVR (HCC) 11/17/2018   Alcohol abuse 11/17/2018   Tobacco dependence syndrome 11/17/2018   [facility])     Past Surgical History:  Procedure Laterality Date   ANTERIOR VITRECTOMY Right 10/04/2020   Procedure: ANTERIOR VITRECTOMY;  Surgeon: Nevada Crane, MD;  Location: [facility];  Service: Ophthalmology;  Laterality: Right;   CATARACT EXTRACTION W/PHACO Right 10/04/2020   Procedure: CATARACT EXTRACTION PHACO AND INTRAOCULAR LENS PLACEMENT (IOC) RIGHT VISION BLUE, CAPSULAR TENSION RING 14A  RIGHT 21.84 01:44.7;  Surgeon: Nevada Crane, MD;  Location: [facility];  Service: Ophthalmology;  Laterality: Right;   CATARACT EXTRACTION W/PHACO Left 10/18/2020   Procedure: CATARACT EXTRACTION PHACO AND INTRAOCULAR LENS PLACEMENT (IOC) LEFT 2.95 00:23.9;  Surgeon: Nevada Crane, MD;  Location: [facility];  Service: Ophthalmology;  Laterality: Left;   PARTIAL COLECTOMY      Prior to Admission medications   Medication Sig Start Date End Date Taking? Authorizing Provider  apixaban (ELIQUIS) 5 MG TABS tablet Take 1 tablet (5 mg total) by mouth 2 (two) times daily. Only start taking this script after you have completed 10mg  BID x 7 days 06/10/20 09/16/20  Charise Killian, MD  metoprolol tartrate (LOPRESSOR) 25 MG tablet Take 0.5 tablets (12.5 mg total) by mouth 2 (  two) times daily. 07/06/20   Joseph Art, DO  Multiple Vitamin (MULTIVITAMIN WITH MINERALS) TABS tablet Take 1 tablet by mouth daily. 02/26/20   Enedina Finner, MD  potassium chloride (KLOR-CON) 10 MEQ tablet Take 10 mEq by mouth 2 (two) times daily.    [provider]    Allergies Patient has no known allergies.  Family History  Problem Relation Age of Onset   Cancer Mother     Kidney disease Brother     Social History Social History   Tobacco Use   Smoking status: Every Day    Packs/day: 1.00    Years: 30.00    Pack years: 30.00    Types: Cigarettes   Smokeless tobacco: Never  Substance Use Topics   Alcohol use: Yes    Alcohol/week: 6.0 standard drinks    Types: 6 Cans of beer per week    Comment: daily   Drug use: Yes    Types: Marijuana    Review of Systems Constitutional: + fever. Eyes: No visual changes. ENT: No sore throat. Cardiovascular: Denies chest pain. Respiratory: Denies shortness of breath. Gastrointestinal: No nausea, vomiting, diarrhea. Genitourinary: Negative for dysuria. Musculoskeletal: +  for back pain. Skin: Negative for rash. Neurological: Negative for focal weakness.  + numbness.  ____________________________________________   PHYSICAL EXAM:  VITAL SIGNS: ED Triage Vitals  Enc Vitals Group     BP 11/11/20 2134 (!) 159/85     Pulse Rate 11/11/20 2134 86     Resp 11/11/20 2134 18     Temp 11/11/20 2134 98.3 F (36.8 C)     Temp Source 11/11/20 2134 Oral     SpO2 11/11/20 2134 98 %     Weight 11/11/20 2132 151 lb (68.5 kg)     Height 11/11/20 2132 5\' 9"  (1.753 m)     Head Circumference --      Peak Flow --      Pain Score 11/11/20 2132 10     Pain Loc --      Pain Edu? --      Excl. in GC? --    CONSTITUTIONAL: Alert and oriented and responds appropriately to questions.  Chronically ill-appearing, disheveled HEAD: Normocephalic EYES: Conjunctivae clear, pupils appear equal, EOM appear intact ENT: normal nose; moist mucous membranes NECK: Supple, normal ROM CARD: RRR; S1 and S2 appreciated; no murmurs, no clicks, no rubs, no gallops RESP: Normal chest excursion without splinting or tachypnea; breath sounds clear and equal bilaterally; no wheezes, no rhonchi, no rales, no hypoxia or respiratory distress, speaking full sentences ABD/GI: Normal bowel sounds; non-distended; soft, non-tender, no rebound, no  guarding, no peritoneal signs, no hepatosplenomegaly BACK: The back appears normal, tender to palpation over the thoracic and lumbar spine without step-off, deformity, redness, warmth, ecchymosis, soft tissue swelling, rash or other lesions. EXT: Normal ROM in all joints; no deformity noted, no edema; no cyanosis SKIN: Normal color for age and race; warm; no rash on exposed skin NEURO: Moves all extremities equally, 2+ deep tendon reflexes in bilateral upper and lower extremities, no clonus, no saddle anesthesia, normal sensation diffusely, strength 5/5 in all 4 extremities PSYCH: The patient's mood and manner are appropriate.  ____________________________________________   LABS (all labs ordered are listed, but only abnormal results are displayed)  Labs Reviewed  CBC WITH DIFFERENTIAL/PLATELET - Abnormal; Notable for the following components:      Result Value   WBC 11.1 (*)    RBC 4.16 (*)    HCT 38.4 (*)  RDW 16.3 (*)    Neutro Abs 8.4 (*)    All other components within normal limits  COMPREHENSIVE METABOLIC PANEL - Abnormal; Notable for the following components:   Calcium 8.8 (*)    Albumin 3.4 (*)    AST 79 (*)    Alkaline Phosphatase 136 (*)    All other components within normal limits  ETHANOL - Abnormal; Notable for the following components:   Alcohol, Ethyl (B) 137 (*)    All other components within normal limits  URINALYSIS, ROUTINE W REFLEX MICROSCOPIC - Abnormal; Notable for the following components:   Color, Urine AMBER (*)    APPearance HAZY (*)    Specific Gravity, Urine 1.041 (*)    Protein, ur 30 (*)    All other components within normal limits  RESP PANEL BY RT-PCR (FLU A&B, COVID) ARPGX2  CULTURE, BLOOD (ROUTINE X 2)  CULTURE, BLOOD (ROUTINE X 2)  LACTIC ACID, PLASMA  PROCALCITONIN   ____________________________________________  EKG   ____________________________________________  RADIOLOGY I, Naveed Humphres, personally viewed and evaluated these  images (plain radiographs) as part of my medical decision making, as well as reviewing the written report by the radiologist.  ED MD interpretation: Chest x-ray shows no infiltrate, edema.  MRIs show no severe spinal stenosis, cauda equina, epidural abscess or hematoma.  Official radiology report(s): DG Chest 2 View  Result Date: 11/12/2020 CLINICAL DATA:  Back pain. EXAM: CHEST - 2 VIEW COMPARISON:  July 04, 2020 FINDINGS: The heart size and mediastinal contours are within normal limits. There is moderate severity calcification of the aortic arch. Both lungs are clear. Radiopaque surgical clips are seen within the right upper quadrant. Multilevel degenerative changes are seen throughout the thoracic spine. IMPRESSION: No active cardiopulmonary disease. Electronically Signed   By: Aram Candela M.D.   On: 11/12/2020 00:37   MR THORACIC SPINE W WO CONTRAST  Result Date: 11/12/2020 CLINICAL DATA:  Initial evaluation for mid and lower back pain since recent steroid injection, question epidural abscess. EXAM: MRI THORACIC AND LUMBAR SPINE WITHOUT AND WITH CONTRAST TECHNIQUE: Multiplanar and multiecho pulse sequences of the thoracic and lumbar spine were obtained without and with intravenous contrast. CONTRAST:  7.16mL GADAVIST GADOBUTROL 1 MMOL/ML IV SOLN COMPARISON:  Prior radiograph from 05/30/2020. FINDINGS: MRI THORACIC SPINE FINDINGS Alignment: Physiologic with preservation of the normal thoracic kyphosis. No listhesis. Vertebrae: Vertebral body height maintained without acute or chronic fracture. Bone marrow signal intensity mildly heterogeneous but overall within normal limits. No discrete or worrisome osseous lesions. No abnormal marrow edema or enhancement. No evidence for osteomyelitis discitis or septic arthritis. Cord: Normal signal and morphology. No abnormal enhancement. No epidural abscess or other collection. Paraspinal and other soft tissues: Unremarkable. Disc levels: Mild for age  multilevel spondylosis seen throughout the midthoracic spine. Several small disc protrusions seen at T3-4 through T8-9 without significant spinal stenosis. Mild discogenic reactive endplate changes at these levels, most pronounced at T8-9. No mild multilevel facet hypertrophy. No high-grade spinal stenosis. Foramina remain patent. No frank impingement. MRI LUMBAR SPINE FINDINGS Segmentation: Standard. Lowest well-formed disc space labeled the L5-S1 level. Alignment: Physiologic with preservation of the normal lumbar lordosis. No significant listhesis. Vertebrae: Vertebral body height maintained without acute or chronic fracture. Bone marrow signal intensity mildly heterogeneous without worrisome osseous lesion. No abnormal marrow edema or enhancement. No evidence for osteomyelitis discitis or septic arthritis. Mild reactive edema present about the left L2-3 facet due to facet arthritis. Conus medullaris: Extends to the L1 level and  appears normal. No abnormal enhancement. No epidural abscess or other collection. Paraspinal and other soft tissues: Unremarkable. Disc levels: L1-2: Disc desiccation with mild disc bulge. Mild facet hypertrophy. No canal or foraminal stenosis. L2-3: Diffuse disc bulge with disc desiccation. Associated reactive endplate spurring. Moderate facet and ligament flavum hypertrophy, slightly worse on the left. Resultant mild canal with moderate left lateral recess stenosis. Mild bilateral L2 foraminal narrowing. L3-4: Degenerative intervertebral disc space narrowing with diffuse disc bulge and disc desiccation. Reactive endplate spurring. Mild bilateral facet hypertrophy. Resultant mild canal with mild to moderate left greater than right lateral recess stenosis. Mild bilateral L3 foraminal narrowing. L4-5: Degenerative intervertebral disc space narrowing with disc desiccation and diffuse disc bulge. Moderate right worse than left facet hypertrophy. Resultant mild narrowing of the lateral  recesses bilaterally. Central canal remains patent. Mild to moderate right with mild left L4 foraminal stenosis. L5-S1: Diffuse disc bulge with disc desiccation and intervertebral disc space narrowing. Reactive endplate spurring. Moderate right worse than left facet arthrosis. Superimposed tiny 3 mm synovial cyst at the anteromedial aspect of the right L5-S1 facet (series 8, image 36). This encroaches upon the right lateral recess and contacts the descending right S1 nerve root. Mild bilateral lateral recess stenosis. Central canal remains patent. Moderate bilateral L5 foraminal stenosis. IMPRESSION: 1. No evidence for acute infection within the thoracic or lumbar spine. No epidural abscess or other collection. 2. Moderate multilevel degenerative spondylosis and facet arthrosis throughout the lumbar spine with resultant mild to moderate lateral recess and foraminal narrowing as above. Notable findings include mild canal with moderate left lateral recess stenosis at L2-3 and L3-4, with moderate bilateral L5 foraminal stenosis. 3. Moderate right-sided facet arthrosis at L5-S1 with superimposed tiny 3 mm synovial cyst, potentially affecting the descending right S1 nerve root. 4. Mild multilevel degenerative spondylosis throughout the thoracic spine without significant stenosis or neural impingement. Electronically Signed   By: Rise Mu M.D.   On: 11/12/2020 02:51   MR Lumbar Spine W Wo Contrast  Result Date: 11/12/2020 CLINICAL DATA:  Initial evaluation for mid and lower back pain since recent steroid injection, question epidural abscess. EXAM: MRI THORACIC AND LUMBAR SPINE WITHOUT AND WITH CONTRAST TECHNIQUE: Multiplanar and multiecho pulse sequences of the thoracic and lumbar spine were obtained without and with intravenous contrast. CONTRAST:  7.5mL GADAVIST GADOBUTROL 1 MMOL/ML IV SOLN COMPARISON:  Prior radiograph from 05/30/2020. FINDINGS: MRI THORACIC SPINE FINDINGS Alignment: Physiologic with  preservation of the normal thoracic kyphosis. No listhesis. Vertebrae: Vertebral body height maintained without acute or chronic fracture. Bone marrow signal intensity mildly heterogeneous but overall within normal limits. No discrete or worrisome osseous lesions. No abnormal marrow edema or enhancement. No evidence for osteomyelitis discitis or septic arthritis. Cord: Normal signal and morphology. No abnormal enhancement. No epidural abscess or other collection. Paraspinal and other soft tissues: Unremarkable. Disc levels: Mild for age multilevel spondylosis seen throughout the midthoracic spine. Several small disc protrusions seen at T3-4 through T8-9 without significant spinal stenosis. Mild discogenic reactive endplate changes at these levels, most pronounced at T8-9. No mild multilevel facet hypertrophy. No high-grade spinal stenosis. Foramina remain patent. No frank impingement. MRI LUMBAR SPINE FINDINGS Segmentation: Standard. Lowest well-formed disc space labeled the L5-S1 level. Alignment: Physiologic with preservation of the normal lumbar lordosis. No significant listhesis. Vertebrae: Vertebral body height maintained without acute or chronic fracture. Bone marrow signal intensity mildly heterogeneous without worrisome osseous lesion. No abnormal marrow edema or enhancement. No evidence for osteomyelitis discitis or septic  arthritis. Mild reactive edema present about the left L2-3 facet due to facet arthritis. Conus medullaris: Extends to the L1 level and appears normal. No abnormal enhancement. No epidural abscess or other collection. Paraspinal and other soft tissues: Unremarkable. Disc levels: L1-2: Disc desiccation with mild disc bulge. Mild facet hypertrophy. No canal or foraminal stenosis. L2-3: Diffuse disc bulge with disc desiccation. Associated reactive endplate spurring. Moderate facet and ligament flavum hypertrophy, slightly worse on the left. Resultant mild canal with moderate left lateral  recess stenosis. Mild bilateral L2 foraminal narrowing. L3-4: Degenerative intervertebral disc space narrowing with diffuse disc bulge and disc desiccation. Reactive endplate spurring. Mild bilateral facet hypertrophy. Resultant mild canal with mild to moderate left greater than right lateral recess stenosis. Mild bilateral L3 foraminal narrowing. L4-5: Degenerative intervertebral disc space narrowing with disc desiccation and diffuse disc bulge. Moderate right worse than left facet hypertrophy. Resultant mild narrowing of the lateral recesses bilaterally. Central canal remains patent. Mild to moderate right with mild left L4 foraminal stenosis. L5-S1: Diffuse disc bulge with disc desiccation and intervertebral disc space narrowing. Reactive endplate spurring. Moderate right worse than left facet arthrosis. Superimposed tiny 3 mm synovial cyst at the anteromedial aspect of the right L5-S1 facet (series 8, image 36). This encroaches upon the right lateral recess and contacts the descending right S1 nerve root. Mild bilateral lateral recess stenosis. Central canal remains patent. Moderate bilateral L5 foraminal stenosis. IMPRESSION: 1. No evidence for acute infection within the thoracic or lumbar spine. No epidural abscess or other collection. 2. Moderate multilevel degenerative spondylosis and facet arthrosis throughout the lumbar spine with resultant mild to moderate lateral recess and foraminal narrowing as above. Notable findings include mild canal with moderate left lateral recess stenosis at L2-3 and L3-4, with moderate bilateral L5 foraminal stenosis. 3. Moderate right-sided facet arthrosis at L5-S1 with superimposed tiny 3 mm synovial cyst, potentially affecting the descending right S1 nerve root. 4. Mild multilevel degenerative spondylosis throughout the thoracic spine without significant stenosis or neural impingement. Electronically Signed   By: Rise Mu M.D.   On: 11/12/2020 02:51     ____________________________________________   PROCEDURES  Procedure(s) performed (including Critical Care):  Procedures   ____________________________________________   INITIAL IMPRESSION / ASSESSMENT AND PLAN / ED COURSE  As part of my medical decision making, I reviewed the following data within the electronic MEDICAL RECORD NUMBER Nursing notes reviewed and incorporated, Labs reviewed , Old chart reviewed, Radiograph reviewed , MRI reviewed, Notes from prior ED visits, and [place] Controlled Substance Database         Patient here with thoracic and lumbar back pain.  Reports he recently had an epidural injection but cannot tell me at what level.  States this was about 2 weeks ago "across the street".  It appears he has seen Dr. Mayford Knife with [facility].  Denies any previous back surgeries.  Does appear he is on Eliquis.  States he has also had subjective fevers.  Rectal temp here is 99.1.  Differential includes epidural abscess, discitis or osteomyelitis, epidural hematoma, cauda equina, radiculopathy, spinal stenosis, herniated disc.  Doubt fracture, transverse myelitis.  Will obtain labs and MRI of the thoracic and lumbar spine with and without contrast.  Will check postvoid residual.  Will give IV pain medication.  He also states that he is having a productive cough.  Given he reports subjective fevers, will obtain chest x-ray and COVID and flu swabs to rule out pneumonia, flu, COVID-19.  ED PROGRESS  Patient's  labs show mild leukocytosis with left shift.  Lactic and procalcitonin are normal.  He does have minimally elevated liver function tests which are chronic for him and likely due to alcohol abuse.  Alcohol level here is 137.  Chest x-ray is clear with no sign of infiltrate or edema.  COVID and flu swabs are negative.  MRIs obtained of the thoracic and lumbar spine showed no sign of acute infection, epidural hematoma, severe spinal stenosis or cauda equina.  He does have multiple  degenerative changes which I think are the cause of his chronic back pain.  He states that he saw his orthopedist yesterday and was given a prescription for tramadol.  He states he does not feel this is helping his pain.  I discussed with patient that given this is a chronic issue I recommend that he continue to follow-up with his outpatient doctors for further management and that he contact their office for further recommendations for pain control if he feels the tramadol is not helping him.  There is no sign of any emergent condition present today and I feel he is safe for discharge.  He has been able to ambulate here on his own without any assistance.   At this time, I do not feel there is any life-threatening condition present. I have reviewed, interpreted and discussed all results (EKG, imaging, lab, urine as appropriate) and exam findings with patient/family. I have reviewed nursing notes and appropriate previous records.  I feel the patient is safe to be discharged home without further emergent workup and can continue workup as an outpatient as needed. Discussed usual and customary return precautions. Patient/family verbalize understanding and are comfortable with this plan.  Outpatient follow-up has been provided as needed. All questions have been answered.  ____________________________________________   FINAL CLINICAL IMPRESSION(S) / ED DIAGNOSES  Final diagnoses:  Acute exacerbation of chronic low back pain  Cough, unspecified type     ED Discharge Orders          Ordered    traMADol (ULTRAM) 50 MG tablet  Every 6 hours PRN,   Status:  Discontinued        11/12/20 0359            *Please note:  Sherlyn Lick was evaluated in Emergency Department on 11/12/2020 for the symptoms described in the history of present illness. He was evaluated in the context of the global COVID-19 pandemic, which necessitated consideration that the patient might be at risk for infection with the  SARS-CoV-2 virus that causes COVID-19. Institutional protocols and algorithms that pertain to the evaluation of patients at risk for COVID-19 are in a state of rapid change based on information released by regulatory bodies including the CDC and federal and state organizations. These policies and algorithms were followed during the patient's care in the ED.  Some ED evaluations and interventions may be delayed as a result of limited staffing during and the pandemic.*   Note:  This document was prepared using Dragon voice recognition software and may include unintentional dictation errors.    Nikash Mortensen, Layla Maw, DO 11/12/20 [phone]

## 2020-11-12 NOTE — Discharge Instructions (Addendum)
You may alternate Tylenol 1000 mg every 6 hours as needed for pain, fever and Ibuprofen 800 mg every 8 hours as needed for pain, fever.  Please take Ibuprofen with food.  Do not take more than 4000 mg of Tylenol (acetaminophen) in a 24 hour period.    You are being provided a prescription for opiates (also known as narcotics) for pain control.  Opiates can be addictive and should only be used when absolutely necessary for pain control when other alternatives do not work.  We recommend you only use them for the recommended amount of time and only as prescribed.  Please do not take with other sedative medications or alcohol.  Please do not drive, operate machinery, make important decisions while taking opiates.  Please note that these medications can be addictive and have high abuse potential.  Patients can become addicted to narcotics after only taking them for a few days.  Please keep these medications locked away from children, teenagers or any family members with history of substance abuse.  Narcotic pain medicine may also make you constipated.  You may use over-the-counter medications such as MiraLAX, Colace to prevent constipation.  If you become constipated you may use over-the-counter enemas as needed.  Itching and nausea are common side effects of narcotic pain medication.  If you develop uncontrolled vomiting or a rash, please stop these medications.  

## 2020-11-17 LAB — CULTURE, BLOOD (ROUTINE X 2)
Culture: NO GROWTH
Culture: NO GROWTH
Special Requests: ADEQUATE
Special Requests: ADEQUATE

## 2020-11-19 ENCOUNTER — Other Ambulatory Visit: Payer: Self-pay | Admitting: Orthopedic Surgery

## 2020-11-19 DIAGNOSIS — M5416 Radiculopathy, lumbar region: Secondary | ICD-10-CM

## 2020-12-27 ENCOUNTER — Other Ambulatory Visit: Payer: Self-pay

## 2020-12-27 ENCOUNTER — Emergency Department: Payer: Medicare Other

## 2020-12-27 DIAGNOSIS — K760 Fatty (change of) liver, not elsewhere classified: Secondary | ICD-10-CM | POA: Diagnosis present

## 2020-12-27 DIAGNOSIS — Z79899 Other long term (current) drug therapy: Secondary | ICD-10-CM

## 2020-12-27 DIAGNOSIS — J690 Pneumonitis due to inhalation of food and vomit: Secondary | ICD-10-CM | POA: Diagnosis present

## 2020-12-27 DIAGNOSIS — E876 Hypokalemia: Secondary | ICD-10-CM | POA: Diagnosis present

## 2020-12-27 DIAGNOSIS — K859 Acute pancreatitis without necrosis or infection, unspecified: Principal | ICD-10-CM | POA: Diagnosis present

## 2020-12-27 DIAGNOSIS — Z7901 Long term (current) use of anticoagulants: Secondary | ICD-10-CM

## 2020-12-27 DIAGNOSIS — I2510 Atherosclerotic heart disease of native coronary artery without angina pectoris: Secondary | ICD-10-CM | POA: Diagnosis present

## 2020-12-27 DIAGNOSIS — K861 Other chronic pancreatitis: Secondary | ICD-10-CM | POA: Diagnosis present

## 2020-12-27 DIAGNOSIS — Z20822 Contact with and (suspected) exposure to covid-19: Secondary | ICD-10-CM | POA: Diagnosis present

## 2020-12-27 DIAGNOSIS — K863 Pseudocyst of pancreas: Secondary | ICD-10-CM | POA: Diagnosis present

## 2020-12-27 DIAGNOSIS — F1010 Alcohol abuse, uncomplicated: Secondary | ICD-10-CM | POA: Diagnosis present

## 2020-12-27 DIAGNOSIS — F17210 Nicotine dependence, cigarettes, uncomplicated: Secondary | ICD-10-CM | POA: Diagnosis present

## 2020-12-27 DIAGNOSIS — I480 Paroxysmal atrial fibrillation: Secondary | ICD-10-CM | POA: Diagnosis present

## 2020-12-27 DIAGNOSIS — Z9049 Acquired absence of other specified parts of digestive tract: Secondary | ICD-10-CM

## 2020-12-27 DIAGNOSIS — I252 Old myocardial infarction: Secondary | ICD-10-CM

## 2020-12-27 DIAGNOSIS — Z8616 Personal history of COVID-19: Secondary | ICD-10-CM

## 2020-12-27 LAB — TROPONIN I (HIGH SENSITIVITY)
Troponin I (High Sensitivity): 8 ng/L (ref <18)
Troponin I (High Sensitivity): 8 ng/L (ref <18)

## 2020-12-27 LAB — CBC
HCT: 42.8 % (ref 39.0–52.0)
Hemoglobin: 14.4 g/dL (ref 13.0–17.0)
MCH: 32.4 pg (ref 26.0–34.0)
MCHC: 33.6 g/dL (ref 30.0–36.0)
MCV: 96.2 fL (ref 80.0–100.0)
Platelets: 152 10*3/uL (ref 150–400)
RBC: 4.45 MIL/uL (ref 4.22–5.81)
RDW: 16.7 % — ABNORMAL HIGH (ref 11.5–15.5)
WBC: 9.7 10*3/uL (ref 4.0–10.5)
nRBC: 0 % (ref 0.0–0.2)

## 2020-12-27 LAB — BASIC METABOLIC PANEL
Anion gap: 13 (ref 5–15)
BUN: <5 mg/dL — ABNORMAL LOW (ref 8–23)
CO2: 27 mmol/L (ref 22–32)
Calcium: 8.6 mg/dL — ABNORMAL LOW (ref 8.9–10.3)
Chloride: 93 mmol/L — ABNORMAL LOW (ref 98–111)
Creatinine, Ser: 0.57 mg/dL — ABNORMAL LOW (ref 0.61–1.24)
GFR, Estimated: >60 mL/min (ref >60)
Glucose, Bld: 118 mg/dL — ABNORMAL HIGH (ref 70–99)
Potassium: 2.6 mmol/L — CL (ref 3.5–5.1)
Sodium: 133 mmol/L — ABNORMAL LOW (ref 135–145)

## 2020-12-27 LAB — RESP PANEL BY RT-PCR (FLU A&B, COVID) ARPGX2
Influenza A by PCR: NEGATIVE
Influenza B by PCR: NEGATIVE
SARS Coronavirus 2 by RT PCR: NEGATIVE

## 2020-12-27 NOTE — ED Triage Notes (Signed)
First Nurse Note:  C/O cough, SOB, Body aches and chest tightness.  AAOx3.  Skin warm and dry. NAD

## 2020-12-27 NOTE — ED Triage Notes (Signed)
Pt c/o chest pain with SOB with body aches, leg pain since yesterday. States he has a hx of MI and this feels the same

## 2020-12-27 NOTE — ED Provider Notes (Signed)
Emergency Medicine Provider Triage Evaluation Note  Thomas Coffey , a 65 y.o. male  was evaluated in triage.  Pt complains of typical complaints including shortness of breath, body aches, chest tightness, and abdominal pain.  He reports onset of symptoms yesterday, he notes it was aggravated shortly after eating hot wings and pizza watching football.  He notes history of AMI, reports that he has been out of his home medicine for at least a week.  Review of Systems  Positive: SOB, ABD pain Negative: FCS  Physical Exam  BP (!) 133/93 (BP Location: Right Arm)   Pulse (!) 116   Temp 97.9 F (36.6 C) (Oral)   Resp 17   Ht 5\' 9"  (1.753 m)   Wt 68 kg   SpO2 97%   BMI 22.15 kg/m  Gen:   Awake, no distress  NAD Resp:  Normal effort CTA MSK:   Moves extremities without difficulty  Other:  CVS: RRR  Medical Decision Making  Medically screening exam initiated at 5:28 PM.  Appropriate orders placed.  was informed that the remainder of the evaluation will be completed by another provider, this initial triage assessment does not replace that evaluation, and the importance of remaining in the ED until their evaluation is complete.  Patient with the evaluation of multiple symptoms including cough, shortness of breath, body aches, and chest tightness.   Sherlyn Lick, PA-C 12/27/20 1730    14/12/22, MD 12/27/20 1755

## 2020-12-28 ENCOUNTER — Inpatient Hospital Stay: Payer: Medicare Other

## 2020-12-28 ENCOUNTER — Inpatient Hospital Stay
Admission: EM | Admit: 2020-12-28 | Discharge: 2020-12-30 | DRG: 438 | Disposition: A | Discharge status: Home / Self Care | Payer: Medicare Other | Attending: Internal Medicine | Admitting: Internal Medicine

## 2020-12-28 ENCOUNTER — Emergency Department: Payer: Medicare Other

## 2020-12-28 ENCOUNTER — Encounter: Payer: Self-pay | Admitting: Internal Medicine

## 2020-12-28 DIAGNOSIS — Z7901 Long term (current) use of anticoagulants: Secondary | ICD-10-CM | POA: Diagnosis not present

## 2020-12-28 DIAGNOSIS — K859 Acute pancreatitis without necrosis or infection, unspecified: Principal | ICD-10-CM

## 2020-12-28 DIAGNOSIS — K863 Pseudocyst of pancreas: Secondary | ICD-10-CM | POA: Diagnosis present

## 2020-12-28 DIAGNOSIS — R17 Unspecified jaundice: Secondary | ICD-10-CM

## 2020-12-28 DIAGNOSIS — E876 Hypokalemia: Secondary | ICD-10-CM | POA: Diagnosis present

## 2020-12-28 DIAGNOSIS — F1010 Alcohol abuse, uncomplicated: Secondary | ICD-10-CM | POA: Diagnosis present

## 2020-12-28 DIAGNOSIS — Z9049 Acquired absence of other specified parts of digestive tract: Secondary | ICD-10-CM | POA: Diagnosis not present

## 2020-12-28 DIAGNOSIS — I252 Old myocardial infarction: Secondary | ICD-10-CM | POA: Diagnosis not present

## 2020-12-28 DIAGNOSIS — Z8616 Personal history of COVID-19: Secondary | ICD-10-CM | POA: Diagnosis not present

## 2020-12-28 DIAGNOSIS — I480 Paroxysmal atrial fibrillation: Secondary | ICD-10-CM | POA: Diagnosis present

## 2020-12-28 DIAGNOSIS — Z720 Tobacco use: Secondary | ICD-10-CM | POA: Diagnosis present

## 2020-12-28 DIAGNOSIS — K760 Fatty (change of) liver, not elsewhere classified: Secondary | ICD-10-CM | POA: Diagnosis present

## 2020-12-28 DIAGNOSIS — F17210 Nicotine dependence, cigarettes, uncomplicated: Secondary | ICD-10-CM | POA: Diagnosis present

## 2020-12-28 DIAGNOSIS — Z20822 Contact with and (suspected) exposure to covid-19: Secondary | ICD-10-CM | POA: Diagnosis present

## 2020-12-28 DIAGNOSIS — J690 Pneumonitis due to inhalation of food and vomit: Secondary | ICD-10-CM | POA: Diagnosis present

## 2020-12-28 DIAGNOSIS — K861 Other chronic pancreatitis: Secondary | ICD-10-CM | POA: Diagnosis present

## 2020-12-28 DIAGNOSIS — R109 Unspecified abdominal pain: Secondary | ICD-10-CM | POA: Diagnosis present

## 2020-12-28 DIAGNOSIS — Z79899 Other long term (current) drug therapy: Secondary | ICD-10-CM | POA: Diagnosis not present

## 2020-12-28 DIAGNOSIS — I2510 Atherosclerotic heart disease of native coronary artery without angina pectoris: Secondary | ICD-10-CM | POA: Diagnosis present

## 2020-12-28 DIAGNOSIS — K862 Cyst of pancreas: Secondary | ICD-10-CM

## 2020-12-28 LAB — HEPATIC FUNCTION PANEL
ALT: 19 U/L (ref 0–44)
AST: 65 U/L — ABNORMAL HIGH (ref 15–41)
Albumin: 3 g/dL — ABNORMAL LOW (ref 3.5–5.0)
Alkaline Phosphatase: 147 U/L — ABNORMAL HIGH (ref 38–126)
Bilirubin, Direct: 0.7 mg/dL — ABNORMAL HIGH (ref 0.0–0.2)
Indirect Bilirubin: 1.5 mg/dL — ABNORMAL HIGH (ref 0.3–0.9)
Total Bilirubin: 2.2 mg/dL — ABNORMAL HIGH (ref 0.3–1.2)
Total Protein: 6.5 g/dL (ref 6.5–8.1)

## 2020-12-28 LAB — MAGNESIUM: Magnesium: 1.3 mg/dL — ABNORMAL LOW (ref 1.7–2.4)

## 2020-12-28 LAB — LIPASE, BLOOD: Lipase: 128 U/L — ABNORMAL HIGH (ref 11–51)

## 2020-12-28 MED ORDER — MAGNESIUM SULFATE 2 GM/50ML IV SOLN
2.0000 g | Freq: Once | INTRAVENOUS | Status: AC
  Administered 2020-12-28: 2 g via INTRAVENOUS
  Filled 2020-12-28: qty 50

## 2020-12-28 MED ORDER — THIAMINE HCL 100 MG/ML IJ SOLN
INTRAVENOUS | Status: DC
  Filled 2020-12-28 (×2): qty 1000

## 2020-12-28 MED ORDER — POTASSIUM CHLORIDE IN NACL 20-0.9 MEQ/L-% IV SOLN: INTRAVENOUS | Status: DC

## 2020-12-28 MED ORDER — ONDANSETRON HCL 4 MG PO TABS: 4.0000 mg | ORAL_TABLET | Freq: Four times a day (QID) | ORAL | Status: DC | PRN

## 2020-12-28 MED ORDER — THIAMINE HCL 100 MG PO TABS
100.0000 mg | ORAL_TABLET | Freq: Every day | ORAL | Status: DC
  Administered 2020-12-29 – 2020-12-30 (×2): 100 mg via ORAL
  Filled 2020-12-28 (×2): qty 1

## 2020-12-28 MED ORDER — THIAMINE HCL 100 MG/ML IJ SOLN
100.0000 mg | Freq: Once | INTRAMUSCULAR | Status: AC
  Administered 2020-12-28: 100 mg via INTRAVENOUS
  Filled 2020-12-28: qty 2

## 2020-12-28 MED ORDER — LORAZEPAM 1 MG PO TABS
1.0000 mg | ORAL_TABLET | ORAL | Status: DC | PRN
  Administered 2020-12-30: 2 mg via ORAL

## 2020-12-28 MED ORDER — FOLIC ACID 5 MG/ML IJ SOLN
1.0000 mg | Freq: Every day | INTRAMUSCULAR | Status: DC
  Filled 2020-12-28: qty 0.2

## 2020-12-28 MED ORDER — LORAZEPAM 2 MG PO TABS
0.0000 mg | ORAL_TABLET | Freq: Four times a day (QID) | ORAL | Status: AC
Start: 2020-12-28 — End: 2020-12-30
  Administered 2020-12-28 (×2): 2 mg via ORAL
  Filled 2020-12-28 (×3): qty 1

## 2020-12-28 MED ORDER — SODIUM CHLORIDE 0.9 % IV SOLN
1.0000 mg | Freq: Once | INTRAVENOUS | Status: DC
  Filled 2020-12-28: qty 0.2

## 2020-12-28 MED ORDER — PANTOPRAZOLE SODIUM 40 MG IV SOLR
40.0000 mg | INTRAVENOUS | Status: DC
  Administered 2020-12-28 – 2020-12-29 (×2): 40 mg via INTRAVENOUS
  Filled 2020-12-28 (×2): qty 40

## 2020-12-28 MED ORDER — POTASSIUM CHLORIDE CRYS ER 20 MEQ PO TBCR
40.0000 meq | EXTENDED_RELEASE_TABLET | Freq: Once | ORAL | Status: DC
  Filled 2020-12-28: qty 2

## 2020-12-28 MED ORDER — POTASSIUM CHLORIDE CRYS ER 20 MEQ PO TBCR
10.0000 meq | EXTENDED_RELEASE_TABLET | Freq: Two times a day (BID) | ORAL | Status: DC
  Administered 2020-12-29 – 2020-12-30 (×3): 10 meq via ORAL
  Filled 2020-12-28 (×3): qty 1

## 2020-12-28 MED ORDER — LORAZEPAM 2 MG/ML IJ SOLN: 1.0000 mg | INTRAMUSCULAR | Status: DC | PRN

## 2020-12-28 MED ORDER — POTASSIUM CHLORIDE CRYS ER 20 MEQ PO TBCR
20.0000 meq | EXTENDED_RELEASE_TABLET | ORAL | Status: AC
  Administered 2020-12-28 (×2): 20 meq via ORAL
  Filled 2020-12-28 (×2): qty 1

## 2020-12-28 MED ORDER — IOHEXOL 300 MG/ML  SOLN
100.0000 mL | Freq: Once | INTRAMUSCULAR | Status: AC | PRN
  Administered 2020-12-28: 100 mL via INTRAVENOUS

## 2020-12-28 MED ORDER — ADULT MULTIVITAMIN W/MINERALS CH
1.0000 | ORAL_TABLET | Freq: Every day | ORAL | Status: DC
  Administered 2020-12-28 – 2020-12-30 (×3): 1 via ORAL
  Filled 2020-12-28 (×3): qty 1

## 2020-12-28 MED ORDER — ONDANSETRON HCL 4 MG/2ML IJ SOLN: 4.0000 mg | Freq: Four times a day (QID) | INTRAMUSCULAR | Status: DC | PRN

## 2020-12-28 MED ORDER — SODIUM CHLORIDE 0.9 % IV BOLUS
1000.0000 mL | Freq: Once | INTRAVENOUS | Status: AC
  Administered 2020-12-28: 1000 mL via INTRAVENOUS

## 2020-12-28 MED ORDER — POTASSIUM CHLORIDE 10 MEQ/100ML IV SOLN
10.0000 meq | INTRAVENOUS | Status: AC
  Administered 2020-12-28: 10 meq via INTRAVENOUS
  Filled 2020-12-28: qty 100

## 2020-12-28 MED ORDER — MORPHINE SULFATE (PF) 2 MG/ML IV SOLN
2.0000 mg | INTRAVENOUS | Status: DC | PRN
  Administered 2020-12-28 – 2020-12-30 (×10): 2 mg via INTRAVENOUS
  Filled 2020-12-28 (×11): qty 1

## 2020-12-28 MED ORDER — GADOBUTROL 1 MMOL/ML IV SOLN
7.0000 mL | Freq: Once | INTRAVENOUS | Status: AC | PRN
  Administered 2020-12-28: 7 mL via INTRAVENOUS

## 2020-12-28 MED ORDER — APIXABAN 5 MG PO TABS
5.0000 mg | ORAL_TABLET | Freq: Two times a day (BID) | ORAL | Status: DC
  Administered 2020-12-28 – 2020-12-30 (×5): 5 mg via ORAL
  Filled 2020-12-28 (×5): qty 1

## 2020-12-28 MED ORDER — MORPHINE SULFATE (PF) 4 MG/ML IV SOLN
4.0000 mg | Freq: Once | INTRAVENOUS | Status: AC
  Administered 2020-12-28: 4 mg via INTRAVENOUS
  Filled 2020-12-28: qty 1

## 2020-12-28 MED ORDER — LORAZEPAM 2 MG PO TABS
0.0000 mg | ORAL_TABLET | Freq: Two times a day (BID) | ORAL | Status: DC
  Administered 2020-12-30: 2 mg via ORAL
  Filled 2020-12-28: qty 1

## 2020-12-28 MED ORDER — NICOTINE 21 MG/24HR TD PT24
21.0000 mg | MEDICATED_PATCH | Freq: Every day | TRANSDERMAL | Status: DC
  Administered 2020-12-28 – 2020-12-30 (×3): 21 mg via TRANSDERMAL
  Filled 2020-12-28 (×3): qty 1

## 2020-12-28 MED ORDER — FOLIC ACID 1 MG PO TABS
1.0000 mg | ORAL_TABLET | Freq: Every day | ORAL | Status: DC
  Administered 2020-12-29 – 2020-12-30 (×2): 1 mg via ORAL
  Filled 2020-12-28 (×2): qty 1

## 2020-12-28 MED ORDER — POTASSIUM CHLORIDE IN NACL 20-0.9 MEQ/L-% IV SOLN
Freq: Once | INTRAVENOUS | Status: AC
  Filled 2020-12-28: qty 1000

## 2020-12-28 MED ORDER — METOPROLOL TARTRATE 25 MG PO TABS
12.5000 mg | ORAL_TABLET | Freq: Two times a day (BID) | ORAL | Status: DC
  Administered 2020-12-28 – 2020-12-30 (×5): 12.5 mg via ORAL
  Filled 2020-12-28 (×5): qty 1

## 2020-12-28 MED ORDER — THIAMINE HCL 100 MG/ML IJ SOLN: 100.0000 mg | Freq: Every day | INTRAMUSCULAR | Status: DC

## 2020-12-28 MED ORDER — ADULT MULTIVITAMIN W/MINERALS CH: 1.0000 | ORAL_TABLET | Freq: Every day | ORAL | Status: DC

## 2020-12-28 NOTE — H&P (Signed)
History and Physical    Thomas Coffey [id number] DOB: August 29, 1955 DOA: 12/28/2020  PCP: [facility]   Patient coming from: Home  I have personally briefly reviewed patient's old medical records in [place]  Chief Complaint: Abdominal pain  HPI: Thomas Coffey is a 65 y.o. male with medical history significant for paroxysmal atrial fibrillation , coronary artery disease, alcohol use disorder, history of pancreatitis who presents to the emergency room for evaluation of diffuse abdominal pain but mostly localized to the periumbilical area.  He rates his pain a 10 x 10 in intensity at its worst.  Pain is nonradiating and is associated with nausea and multiple episodes of emesis.  He states that his symptoms started after he had hot chicken legs, pizza and beer while watching a game 2 days prior to his admission.  The pain has been persistent prompting his visit to the emergency room. He denies having any fever or chills, no changes in his bowel habits, no headache, no dizziness, no lightheadedness, no urinary frequency, no nocturia, no dysuria, no focal deficits, no blurred vision or headache. Sodium 133, potassium 2.6, chloride 93, bicarb 27, glucose 118, BUN less than 5, creatinine 0.57, calcium 8.6, magnesium 1.3, alkaline phosphatase 147, albumin 3.0, lipase 128, AST 65, ALT 19, total protein 6.5, total bilirubin 2.2, white count 9.7, hemoglobin 14.4, hematocrit 42.8, MCV 96.2, RDW 16.7, platelet count 152 Respiratory viral panel is negative CT scan of abdomen and pelvis shows interval enlargement of an intermediate attenuation cystic-appearing lesion in the pancreatic head.  This is associated with pancreatic ductal dilatation.  While this may simply represent an evolving pancreatic pseudocyst in the setting of prior pancreatitis the possibility of a cystic neoplasm obstructing the pancreatic duct is not entirely excluded.  MRCP is recommended.   Cholelithiasis without evidence of acute cholecystitis.  Masslike thickening of the second portion of the duodenum which may simply be reactive in the setting of pancreatitis.  However the possibility of an underlying neoplasm is not excluded. Twelve-lead EKG reviewed by me shows sinus tachycardia with left axis deviation    ED Course: Patient is a 65 year old male who presents to the ER for evaluation of a 2-day history of abdominal pain mostly localized to the periumbilical area which started after a greasy meal.  He admits to continued alcohol use and has a history of chronic pancreatitis. Labs show elevated lipase level but improved from his last hospitalization.  He also has hypokalemia as well as hypomagnesemia. Imaging shows a cystic appearing lesion in the pancreatic head concerning for possible pseudocyst versus mass lesion. He will be admitted to the hospital for further evaluation  Review of Systems: As per HPI otherwise all other systems reviewed and negative.    Past Medical History:  Diagnosis Date   Acute pancreatitis    COVID-19 virus infection    positive COVID-19 test on 01/22/20   MI (myocardial infarction) ([place])    Paroxysmal atrial fibrillation [facility])     Past Surgical History:  Procedure Laterality Date   ANTERIOR VITRECTOMY Right 10/04/2020   Procedure: ANTERIOR VITRECTOMY;  Surgeon: Eulogio Bear, MD;  Location: [place];  Service: Ophthalmology;  Laterality: Right;   CATARACT EXTRACTION W/PHACO Right 10/04/2020   Procedure: CATARACT EXTRACTION PHACO AND INTRAOCULAR LENS PLACEMENT (IOC) RIGHT VISION BLUE, CAPSULAR TENSION RING 14A  RIGHT 21.84 01:44.7;  Surgeon: Eulogio Bear, MD;  Location: [place];  Service: Ophthalmology;  Laterality: Right;   CATARACT  EXTRACTION W/PHACO Left 10/18/2020   Procedure: CATARACT EXTRACTION PHACO AND INTRAOCULAR LENS PLACEMENT (IOC) LEFT 2.95 00:23.9;  Surgeon: Eulogio Bear, MD;  Location: [place];  Service: Ophthalmology;  Laterality: Left;   PARTIAL COLECTOMY       reports that he has been smoking cigarettes. He has a 30.00 pack-year smoking history. He has never used smokeless tobacco. He reports current alcohol use of about 6.0 standard drinks per week. He reports current drug use. Drug: Marijuana.  No Known Allergies  Family History  Problem Relation Age of Onset   Cancer Mother    Kidney disease Brother       Prior to Admission medications   Medication Sig Start Date End Date Taking? Authorizing Provider  apixaban (ELIQUIS) 5 MG TABS tablet Take 1 tablet (5 mg total) by mouth 2 (two) times daily. Only start taking this script after you have completed 10mg  BID x 7 days 06/10/20 09/16/20  Wyvonnia Dusky, MD  metoprolol tartrate (LOPRESSOR) 25 MG tablet Take 0.5 tablets (12.5 mg total) by mouth 2 (two) times daily. 07/06/20   Geradine Girt, DO  Multiple Vitamin (MULTIVITAMIN WITH MINERALS) TABS tablet Take 1 tablet by mouth daily. 02/26/20   Fritzi Mandes, MD  potassium chloride (KLOR-CON) 10 MEQ tablet Take 10 mEq by mouth 2 (two) times daily.    [provider]    Physical Exam: Vitals:   12/28/20 0650 12/28/20 0700 12/28/20 0800 12/28/20 0844  BP: (!) 156/87 (!) 145/98 (!) 139/92   Pulse: 95 95 95 84  Resp: 19 (!) 22 17 18   Temp:    98 F (36.7 C)  TempSrc:      SpO2: 100% 96% 99% 100%  Weight:      Height:         Vitals:   12/28/20 0650 12/28/20 0700 12/28/20 0800 12/28/20 0844  BP: (!) 156/87 (!) 145/98 (!) 139/92   Pulse: 95 95 95 84  Resp: 19 (!) 22 17 18   Temp:    98 F (36.7 C)  TempSrc:      SpO2: 100% 96% 99% 100%  Weight:      Height:          Constitutional: Alert and oriented x 3 . Not in any apparent distress.  Chronically ill-appearing HEENT:      Head: Normocephalic and atraumatic.         Eyes: PERLA, EOMI, Conjunctivae are normal. Sclera is non-icteric.       Mouth/Throat: Mucous membranes are moist.        Neck: Supple with no signs of meningismus. Cardiovascular: Tachycardic. No murmurs, gallops, or rubs. 2+ symmetrical distal pulses are present . No JVD. No LE edema Respiratory: Respiratory effort normal .Lungs sounds clear bilaterally. No wheezes, crackles, or rhonchi.  Gastrointestinal: Soft, diffusely tender but localized to the periumbilical and epigastric region, and non distended with positive bowel sounds.  Genitourinary: No CVA tenderness. Musculoskeletal: Nontender with normal range of motion in all extremities. No cyanosis, or erythema of extremities. Neurologic:  Face is symmetric. Moving all extremities. No gross focal neurologic deficits . Skin: Skin is warm, dry.  No rash or ulcers Psychiatric: Mood and affect are normal    Labs on Admission: I have personally reviewed following labs and imaging studies  CBC: Recent Labs  Lab 12/27/20 1720  WBC 9.7  HGB 14.4  HCT 42.8  MCV 96.2  PLT 0000000   Basic Metabolic Panel: Recent Labs  Lab  12/27/20 1720 12/28/20 0558  NA 133*  --   K 2.6*  --   CL 93*  --   CO2 27  --   GLUCOSE 118*  --   BUN <5*  --   CREATININE 0.57*  --   CALCIUM 8.6*  --   MG  --  1.3*   GFR: Estimated Creatinine Clearance: 88.5 mL/min (A) (by C-G formula based on SCr of 0.57 mg/dL (L)). Liver Function Tests: Recent Labs  Lab 12/28/20 0558  AST 65*  ALT 19  ALKPHOS 147*  BILITOT 2.2*  PROT 6.5  ALBUMIN 3.0*   Recent Labs  Lab 12/28/20 0558  LIPASE 128*   No results for input(s): AMMONIA in the last 168 hours. Coagulation Profile: No results for input(s): INR, PROTIME in the last 168 hours. Cardiac Enzymes: No results for input(s): CKTOTAL, CKMB, CKMBINDEX, TROPONINI in the last 168 hours. BNP (last 3 results) No results for input(s): PROBNP in the last 8760 hours. HbA1C: No results for input(s): HGBA1C in the last 72 hours. CBG: No results for input(s): GLUCAP in the last 168 hours. Lipid Profile: No results for input(s):  CHOL, HDL, LDLCALC, TRIG, CHOLHDL, LDLDIRECT in the last 72 hours. Thyroid Function Tests: No results for input(s): TSH, T4TOTAL, FREET4, T3FREE, THYROIDAB in the last 72 hours. Anemia Panel: No results for input(s): VITAMINB12, FOLATE, FERRITIN, TIBC, IRON, RETICCTPCT in the last 72 hours. Urine analysis:    Component Value Date/Time   COLORURINE AMBER (A) 11/12/2020 0336   APPEARANCEUR HAZY (A) 11/12/2020 0336   LABSPEC 1.041 (H) 11/12/2020 0336   PHURINE 5.0 11/12/2020 0336   GLUCOSEU NEGATIVE 11/12/2020 0336   HGBUR NEGATIVE 11/12/2020 0336   BILIRUBINUR NEGATIVE 11/12/2020 0336   KETONESUR NEGATIVE 11/12/2020 0336   PROTEINUR 30 (A) 11/12/2020 0336   NITRITE NEGATIVE 11/12/2020 0336   LEUKOCYTESUR NEGATIVE 11/12/2020 0336    Radiological Exams on Admission: DG Chest 2 View  Result Date: 12/27/2020 CLINICAL DATA:  Chest pain body aches EXAM: CHEST - 2 VIEW COMPARISON:  11/12/2020 FINDINGS: Vague left lower lung opacity. No pleural effusion. Normal cardiomediastinal silhouette with aortic atherosclerosis. No pneumothorax. IMPRESSION: Vague left lower lung opacity, indeterminate for pneumonia or other airspace opacity. If symptoms are suggestive of respiratory infection, would obtain short interval radiographic follow-up after adequate therapy. Otherwise chest CT could be considered for further evaluation. Electronically Signed   By: Donavan Foil M.D.   On: 12/27/2020 17:52   CT ABDOMEN PELVIS W CONTRAST  Result Date: 12/28/2020 CLINICAL DATA:  65 year old male with history of acute onset of nonlocalized abdominal pain. EXAM: CT ABDOMEN AND PELVIS WITH CONTRAST TECHNIQUE: Multidetector CT imaging of the abdomen and pelvis was performed using the standard protocol following bolus administration of intravenous contrast. CONTRAST:  18mL OMNIPAQUE IOHEXOL 300 MG/ML  SOLN COMPARISON:  CT the abdomen and pelvis 07/04/2020. FINDINGS: Lower chest: Patchy multifocal ground-glass attenuation  airspace disease in the lung bases bilaterally, most severe in the periphery of the right lower lobe, new compared to the prior examination. Atherosclerotic calcifications in the descending thoracic aorta as well as the left circumflex and right coronary arteries. Hepatobiliary: There is heterogeneous attenuation throughout the hepatic parenchyma with ill-defined areas of low-attenuation noted, most evident throughout the right lobe of the liver. These are poorly characterized on today's CT examination. No intrahepatic biliary ductal dilatation. Small calcified gallstones lying dependently in the gallbladder measuring up to 6 mm. Gallbladder is moderately distended. Gallbladder wall thickness is normal. No pericholecystic fluid  or surrounding inflammatory changes. 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 Atrophy throughout the body and tail of the pancreas where there is diffuse ductal dilatation measuring up to 5 mm in the proximal pancreatic body. There is a small amount of peripancreatic inflammatory changes, which could suggest some residual pancreatitis. No well-defined peripancreatic fluid collections are otherwise noted to suggest additional pseudocyst. Spleen: Unremarkable. Adrenals/Urinary Tract: Multifocal cortical thinning in both kidneys. No suspicious renal lesions. No hydroureteronephrosis. Bilateral adrenal glands are normal in appearance. Urinary bladder is normal in appearance. Stomach/Bowel: The appearance of the stomach is normal. Profound thickening of the duodenal wall, most evident in the second portion of the duodenum, best appreciated on axial image 30 of series 2 where this is somewhat mass-like in appearance. No pathologic dilatation of small bowel or colon. Status  post right hemicolectomy. Vascular/Lymphatic: Aortic atherosclerosis, without evidence of aneurysm or dissection in the abdominal or pelvic vasculature. No definite lymphadenopathy noted in the abdomen or pelvis. Reproductive: Prostate gland and seminal vesicles are unremarkable in appearance. Other: No significant volume of ascites.  No pneumoperitoneum. Musculoskeletal: There are no aggressive appearing lytic or blastic lesions noted in the visualized portions of the skeleton. IMPRESSION: 1. Interval enlargement of a intermediate attenuation cystic appearing lesion in the pancreatic head. This is associated with pancreatic ductal dilatation. While this may simply represent an evolving pancreatic pseudocyst in the setting of prior pancreatitis, the possibility of a cystic neoplasm obstructing the pancreatic duct is not entirely excluded. Accordingly, further evaluation with abdominal MRI with and without IV gadolinium with MRCP is recommended at this time. 2. The appearance of the liver is very heterogeneous. Although the appearance is favored to reflect heterogeneous hepatic steatosis, the possibility of one or more infiltrative lesions (i.e., metastatic lesions) is difficult to exclude on today's examination. Attention at time of forthcoming abdominal MRI is recommended to better evaluate these findings. 3. Cholelithiasis without evidence of acute cholecystitis at this time. No definitive evidence of choledocholithiasis. 4. Mass-like thickening of the second portion of the duodenum. Attention at forthcoming abdominal MRI is recommended. This may simply be reactive in the setting of pancreatitis, however, the possibility of underlying neoplasm is not excluded. Further evaluation with endoscopy may be warranted if clinically appropriate. 5. Patchy areas of peripheral predominant ground-glass attenuation in the visualize lung bases, concerning for multilobar bilateral bronchopneumonia. 6. Aortic atherosclerosis. 7.  Additional incidental findings, as above. Electronically Signed   By: Vinnie Langton M.D.   On: 12/28/2020 07:02     Assessment/Plan Principal Problem:   Acute on chronic pancreatitis (HCC) Active Problems:   Alcohol abuse   Hypomagnesemia   Tobacco abuse   Abdominal pain   AF (paroxysmal atrial fibrillation) [facility])     Patient is a 65 year old who presents to the ER for evaluation of abdominal pain consistent with pancreatitis and is found to have a cystic appearing lesion in the pancreatic head which may be pseudocyst.   Acute on chronic pancreatitis ??  Biliary colic Patient's symptoms started after a greasy meal which consisted of chicken wings and pizza. He is noted to have cholelithiasis and labs show slightly elevated lipase levels He also admits to continued alcohol use Supportive care with IV fluid hydration, antiemetics, pain control and IV PPI. We will  keep patient n.p.o. for now Imaging shows a cystic appearing lesion in the pancreatic head concerning for possible pseudocyst versus mass lesion. Follow-up results of MRCP    Hypomagnesemia/hypokalemia  Secondary to GI losses from nausea and vomiting Will supplement magnesium and potassium    Paroxysmal atrial fibrillation Continue metoprolol for rate control Continue apixaban as primary prophylaxis for an acute stroke    Alcohol use disorder Monitor closely for signs and symptoms of alcohol withdrawal Will place on CIWA protocol and administer lorazepam for CIWA score of 8 or greater     Nicotine dependence Smoking cessation has been discussed with patient in detail Will place patient on a nicotine transdermal patch 21 mg daily   DVT prophylaxis: Apixaban Code Status: full code  Family Communication: Greater than 50% of time was spent discussing patient's condition and plan of care with him at the bedside.  All questions and concerns have been addressed.  He verbalizes understanding and agrees with  the plan. Disposition Plan: Back to previous home environment Consults called: none  Status:At the time of admission, it appears that the appropriate admission status for this patient is inpatient. This is judged to be reasonable and necessary to provide the required intensity of service to ensure the patient's safety given the presenting symptoms, physical exam findings, and initial radiographic and laboratory data in the context of their comorbid conditions. Patient requires inpatient status due to high intensity of service, high risk for further deterioration and high frequency of surveillance required.     Dominie Benedick MD [facility]     12/28/2020, 9:00 AM

## 2020-12-28 NOTE — ED Notes (Signed)
Patient transported to CT 

## 2020-12-28 NOTE — ED Provider Notes (Signed)
[facility]  ____________________________________________   Event Date/Time   First MD Initiated Contact with Patient 12/28/20 [phone]     (approximate)  I have reviewed the triage vital signs and the nursing notes.   HISTORY  Chief Complaint Shortness of Breath and Chest Pain    HPI Clebert D Maslowski is a 65 y.o. male past medical history of paroxysmal A. fib, not on anticoagulation, coronary artery disease status post MI, alcohol use disorder, history of pancreatitis who presents with abdominal pain.  Patient tells me that he was eating wings on Sunday and afterward developed abdominal pain.  Pain is located in the midepigastrium radiating up to the chest.  Describes this is similar to his prior pancreatitis pain.  He denies nausea or vomiting.  Denies diarrhea fevers chills.         Past Medical History:  Diagnosis Date   Acute pancreatitis    COVID-19 virus infection    positive COVID-19 test on 01/22/20   MI (myocardial infarction) ([place])    Paroxysmal atrial fibrillation [facility])     Patient Active Problem List   Diagnosis Date Noted   Abdominal pain 06/02/2020   Transaminitis 06/02/2020   Leukocytosis 06/02/2020   AF (paroxysmal atrial fibrillation) ([place]) 06/02/2020   Portal vein thrombosis 16/55/3748   Acute metabolic encephalopathy    Failure to thrive in adult 02/13/2020   COVID-19 01/29/2020   COVID-19 virus infection 01/28/2020   Abnormal LFTs 01/28/2020   CAD (coronary artery disease) 01/28/2020   Atrial fibrillation, chronic ([place]) 01/28/2020   Acute respiratory disease due to COVID-19 virus 01/28/2020   Tobacco abuse 01/28/2020   Acute pancreatitis 07/20/2019   Hypomagnesemia 11/24/2018   Protein-calorie malnutrition, severe 11/23/2018   Pleural effusion on left    Liver function test abnormality    AKI (acute kidney injury) ([place])    Atrial fibrillation with rapid ventricular response ([place])    Acute hepatitis 11/17/2018   Atrial  fibrillation with RVR ([place]) 11/17/2018   Alcohol abuse 11/17/2018   Tobacco dependence syndrome 11/17/2018   Empyema [facility])     Past Surgical History:  Procedure Laterality Date   ANTERIOR VITRECTOMY Right 10/04/2020   Procedure: ANTERIOR VITRECTOMY;  Surgeon: Eulogio Bear, MD;  Location: [place];  Service: Ophthalmology;  Laterality: Right;   CATARACT EXTRACTION W/PHACO Right 10/04/2020   Procedure: CATARACT EXTRACTION PHACO AND INTRAOCULAR LENS PLACEMENT (IOC) RIGHT VISION BLUE, CAPSULAR TENSION RING 14A  RIGHT 21.84 01:44.7;  Surgeon: Eulogio Bear, MD;  Location: [place];  Service: Ophthalmology;  Laterality: Right;   CATARACT EXTRACTION W/PHACO Left 10/18/2020   Procedure: CATARACT EXTRACTION PHACO AND INTRAOCULAR LENS PLACEMENT (IOC) LEFT 2.95 00:23.9;  Surgeon: Eulogio Bear, MD;  Location: [place];  Service: Ophthalmology;  Laterality: Left;   PARTIAL COLECTOMY      Prior to Admission medications   Medication Sig Start Date End Date Taking? Authorizing Provider  apixaban (ELIQUIS) 5 MG TABS tablet Take 1 tablet (5 mg total) by mouth 2 (two) times daily. Only start taking this script after you have completed 63m BID x 7 days 06/10/20 09/16/20  WWyvonnia Dusky MD  metoprolol tartrate (LOPRESSOR) 25 MG tablet Take 0.5 tablets (12.5 mg total) by mouth 2 (two) times daily. 07/06/20   VGeradine Girt DO  Multiple Vitamin (MULTIVITAMIN WITH MINERALS) TABS tablet Take 1 tablet by mouth daily. 02/26/20   PFritzi Mandes MD  potassium chloride (KLOR-CON) 10 MEQ tablet Take 10 mEq  by mouth 2 (two) times daily.    [provider]    Allergies Patient has no known allergies.  Family History  Problem Relation Age of Onset   Cancer Mother    Kidney disease Brother     Social History Social History   Tobacco Use   Smoking status: Every Day    Packs/day: 1.00    Years: 30.00    Pack years: 30.00    Types: Cigarettes   Smokeless  tobacco: Never  Substance Use Topics   Alcohol use: Yes    Alcohol/week: 6.0 standard drinks    Types: 6 Cans of beer per week    Comment: daily   Drug use: Yes    Types: Marijuana    Review of Systems   Review of Systems  Constitutional:  Negative for chills and fever.  Respiratory:  Negative for shortness of breath.   Cardiovascular:  Positive for chest pain.  Gastrointestinal:  Positive for abdominal pain. Negative for diarrhea, nausea and vomiting.  All other systems reviewed and are negative.  Physical Exam Updated Vital Signs BP (!) 145/98   Pulse 95   Temp 97.9 F (36.6 C) (Oral)   Resp (!) 22   Ht _0  (1.753 m)   Wt 68 kg   SpO2 96%   BMI 22.15 kg/m   Physical Exam Vitals and nursing note reviewed.  Constitutional:      General: He is not in acute distress.    Appearance: Normal appearance.  HENT:     Head: Normocephalic and atraumatic.  Eyes:     General: No scleral icterus.    Conjunctiva/sclera: Conjunctivae normal.  Cardiovascular:     Rate and Rhythm: Normal rate and regular rhythm.  Pulmonary:     Effort: Pulmonary effort is normal. No respiratory distress.     Breath sounds: Normal breath sounds. No wheezing.  Abdominal:     Comments: Abdomen is diffusely tender, primarily in the epigastric region, voluntary guarding  Musculoskeletal:        General: No deformity or signs of injury.     Cervical back: Normal range of motion.  Skin:    Coloration: Skin is not jaundiced or pale.  Neurological:     General: No focal deficit present.     Mental Status: He is alert and oriented to person, place, and time. Mental status is at baseline.  Psychiatric:        Mood and Affect: Mood normal.        Behavior: Behavior normal.     LABS (all labs ordered are listed, but only abnormal results are displayed)  Labs Reviewed  BASIC METABOLIC PANEL - Abnormal; Notable for the following components:      Result Value   Sodium 133 (*)    Potassium 2.6  (*)    Chloride 93 (*)    Glucose, Bld 118 (*)    BUN <5 (*)    Creatinine, Ser 0.57 (*)    Calcium 8.6 (*)    All other components within normal limits  CBC - Abnormal; Notable for the following components:   RDW 16.7 (*)    All other components within normal limits  LIPASE, BLOOD - Abnormal; Notable for the following components:   Lipase 128 (*)    All other components within normal limits  MAGNESIUM - Abnormal; Notable for the following components:   Magnesium 1.3 (*)    All other components within normal limits  HEPATIC FUNCTION PANEL -  Abnormal; Notable for the following components:   Albumin 3.0 (*)    AST 65 (*)    Alkaline Phosphatase 147 (*)    Total Bilirubin 2.2 (*)    Bilirubin, Direct 0.7 (*)    Indirect Bilirubin 1.5 (*)    All other components within normal limits  RESP PANEL BY RT-PCR (FLU A&B, COVID) ARPGX2  TROPONIN I (HIGH SENSITIVITY)  TROPONIN I (HIGH SENSITIVITY)   ____________________________________________  EKG  Sinus tachycardia, left axis deviation, normal intervals, no acute ischemic changes ____________________________________________  RADIOLOGY Almeta Monas, personally viewed and evaluated these images (plain radiographs) as part of my medical decision making, as well as reviewing the written report by the radiologist.  ED MD interpretation:    I reviewed the CT abdomen pelvis which shows a large pancreatic pseudocyst versus neoplasm causing pancreatic ductal dilatation  ____________________________________________   PROCEDURES  Procedure(s) performed (including Critical Care):  Procedures   ____________________________________________   INITIAL IMPRESSION / [place] / ED COURSE  Patient is a 65 year old male history of pancreatitis and alcohol use disorder who presents with abdominal pain.  Pain radiates up into the chest, it is typical of his pancreas pain.  He is mildly tachycardic, EKG shows sinus  tachycardia.  On my evaluation he is tender in the epigastrium and throughout the abdomen, with voluntary guarding.  Labs are notable for an elevated lipase to 128, mildly elevated T bili to 2.2 and alk phos to 147.  He is also hypokalemic and hypomagnesemic I suspect is in the setting of alcohol use.  Patient tells he is only drinking about once per week, does not look to be in alcohol withdrawal at this time.  He was given morphine for pain control and potassium and mag supplemented.  CT abdomen pelvis obtained which shows a large cystic lesion in the pancreas which could be a pseudocyst versus malignancy and there is pancreatic ductal dilatation.  Radiology recommends MRCP.  We will admit for electrolyte replacement, pain control and MRCP.     ____________________________________________   FINAL CLINICAL IMPRESSION(S) / ED DIAGNOSES  Final diagnoses:  Pancreatic cyst  Hypokalemia  Hypomagnesemia     ED Discharge Orders     None        Note:  This document was prepared using Dragon voice recognition software and may include unintentional dictation errors.    Rada Hay, MD 12/28/20 [phone]

## 2020-12-29 DIAGNOSIS — E876 Hypokalemia: Secondary | ICD-10-CM | POA: Diagnosis present

## 2020-12-29 DIAGNOSIS — J690 Pneumonitis due to inhalation of food and vomit: Secondary | ICD-10-CM | POA: Diagnosis present

## 2020-12-29 LAB — BASIC METABOLIC PANEL
Anion gap: 11 (ref 5–15)
BUN: 7 mg/dL — ABNORMAL LOW (ref 8–23)
CO2: 24 mmol/L (ref 22–32)
Calcium: 8.5 mg/dL — ABNORMAL LOW (ref 8.9–10.3)
Chloride: 99 mmol/L (ref 98–111)
Creatinine, Ser: 0.54 mg/dL — ABNORMAL LOW (ref 0.61–1.24)
GFR, Estimated: >60 mL/min (ref >60)
Glucose, Bld: 75 mg/dL (ref 70–99)
Potassium: 3.2 mmol/L — ABNORMAL LOW (ref 3.5–5.1)
Sodium: 134 mmol/L — ABNORMAL LOW (ref 135–145)

## 2020-12-29 LAB — CBC
HCT: 38.7 % — ABNORMAL LOW (ref 39.0–52.0)
Hemoglobin: 13 g/dL (ref 13.0–17.0)
MCH: 31.9 pg (ref 26.0–34.0)
MCHC: 33.6 g/dL (ref 30.0–36.0)
MCV: 95.1 fL (ref 80.0–100.0)
Platelets: 157 10*3/uL (ref 150–400)
RBC: 4.07 MIL/uL — ABNORMAL LOW (ref 4.22–5.81)
RDW: 16 % — ABNORMAL HIGH (ref 11.5–15.5)
WBC: 8.1 10*3/uL (ref 4.0–10.5)
nRBC: 0 % (ref 0.0–0.2)

## 2020-12-29 MED ORDER — DM-GUAIFENESIN ER 30-600 MG PO TB12
1.0000 | ORAL_TABLET | Freq: Two times a day (BID) | ORAL | Status: DC
  Administered 2020-12-29 – 2020-12-30 (×2): 1 via ORAL
  Filled 2020-12-29 (×2): qty 1

## 2020-12-29 MED ORDER — POTASSIUM CHLORIDE IN NACL 20-0.9 MEQ/L-% IV SOLN
Freq: Once | INTRAVENOUS | Status: DC
  Filled 2020-12-29: qty 1000

## 2020-12-29 MED ORDER — SODIUM CHLORIDE 0.9 % IV SOLN
3.0000 g | Freq: Four times a day (QID) | INTRAVENOUS | Status: DC
  Administered 2020-12-29 – 2020-12-30 (×3): 3 g via INTRAVENOUS
  Filled 2020-12-29 (×4): qty 8
  Filled 2020-12-29 (×2): qty 3

## 2020-12-29 MED ORDER — MAGNESIUM SULFATE 2 GM/50ML IV SOLN
2.0000 g | Freq: Once | INTRAVENOUS | Status: AC
  Administered 2020-12-29: 11:00:00 2 g via INTRAVENOUS
  Filled 2020-12-29: qty 50

## 2020-12-29 NOTE — TOC Initial Note (Signed)
Transition of Care [facility]) - Initial/Assessment Note    Patient Details  Name: Thomas Coffey MRN: [id number] Date of Birth: 1955/04/22  [facility]) CM/SW Contact:    Chapman Fitch, RN Phone Number: 12/29/2020, 10:03 AM  Clinical Narrative:                  Admitted QPR:[id number]  Admitted from: home with son.  States that he is working on find himself his own apartment  TTS:[id number] community health Pharmacy:[facility]. States his copays are $0, but he ran out of his copays because he didn't follow up with his PCP for refills  Current home health/prior home health/DME:NA  Declined substance abuse resources. States he doesn't drink frequently "I just decided to have a beer while we watched the game"   Expected Discharge Plan: Home/Self Care Barriers to Discharge: Continued Medical Work up   Patient Goals and CMS Choice        Expected Discharge Plan and Services Expected Discharge Plan: Home/Self Care       Living arrangements for the past 2 months: Single Family Home                                      Prior Living Arrangements/Services Living arrangements for the past 2 months: Single Family Home Lives with:: Adult Children Patient language and need for interpreter reviewed:: Yes        Need for Family Participation in Patient Care: No (Comment) Care giver support system in place?: Yes (comment)   Criminal Activity/Legal Involvement Pertinent to Current Situation/Hospitalization: No - Comment as needed  Activities of Daily Living Home Assistive Devices/Equipment: None ADL Screening (condition at time of admission) Patient's cognitive ability adequate to safely complete daily activities?: Yes Is the patient deaf or have difficulty hearing?: No Does the patient have difficulty seeing, even when wearing glasses/contacts?: No Does the patient have difficulty concentrating, remembering, or making decisions?: No Patient able to  express need for assistance with ADLs?: Yes Does the patient have difficulty dressing or bathing?: No Independently performs ADLs?: Yes (appropriate for developmental age) Does the patient have difficulty walking or climbing stairs?: No Weakness of Legs: None Weakness of Arms/Hands: None  Permission Sought/Granted                  Emotional Assessment       Orientation: : Oriented to Self, Oriented to Place, Oriented to  Time, Oriented to Situation Alcohol / Substance Use: Alcohol Use Psych Involvement: No (comment)  Admission diagnosis:  Hypokalemia [E87.6] Hypomagnesemia [E83.42] Pancreatic cyst [K86.2] Abdominal pain [R10.9] Patient Active Problem List   Diagnosis Date Noted   Hypokalemia 12/29/2020   Abdominal pain 06/02/2020   Transaminitis 06/02/2020   Leukocytosis 06/02/2020   AF (paroxysmal atrial fibrillation) (HCC) 06/02/2020   Portal vein thrombosis 06/01/2020   Acute metabolic encephalopathy    Failure to thrive in adult 02/13/2020   COVID-19 01/29/2020   COVID-19 virus infection 01/28/2020   Abnormal LFTs 01/28/2020   CAD (coronary artery disease) 01/28/2020   Atrial fibrillation, chronic (HCC) 01/28/2020   Acute respiratory disease due to COVID-19 virus 01/28/2020   Tobacco abuse 01/28/2020   Acute on chronic pancreatitis (HCC) 07/20/2019   Hypomagnesemia 11/24/2018   Protein-calorie malnutrition, severe 11/23/2018   Pleural effusion on left    Liver function test abnormality    AKI (acute kidney injury) (HCC)  Atrial fibrillation with rapid ventricular response (HCC)    Acute hepatitis 11/17/2018   Atrial fibrillation with RVR (HCC) 11/17/2018   Alcohol abuse 11/17/2018   Tobacco dependence syndrome 11/17/2018   Empyema [facility])    PCP:  [facility] Pharmacy:   [id number] [place], [place] - [address] AT [facility] OF [address] [place] [address] Phone:  [phone] Fax: [phone]  [facility][id number] [place], [place] - [address] AT [facility] [place] [address] [place] [address] Phone: [phone] Fax: [phone]     Social Determinants of Health (SDOH) Interventions    Readmission Risk Interventions No flowsheet data found.

## 2020-12-29 NOTE — Plan of Care (Signed)

## 2020-12-29 NOTE — Assessment & Plan Note (Signed)
Due to pancreatitis.  Management as outlined. Improved since admission. Monitor with advancing of diet.

## 2020-12-29 NOTE — Hospital Course (Signed)
Thomas Coffey is a 65 y.o. male with medical history significant for paroxysmal atrial fibrillation , coronary artery disease, alcohol use disorder, history of chronic pancreatitis who presented to the ED on 12/27/2020 for evaluation of persistent severe abdominal pain, diffuse but mostly localized to the periumbilical area.  He reported onset after he had hot chicken legs, pizza and beer while watching a game 2 days prior.  Evaluation in the ED consistent with acute on chronic pancreatitis with mildly elevated lipase of 128 and CT abdomen/pelvis findings as below.  Also had significant hypokalemia and hypomagnesemia on admission.   ".Marland KitchenMarland KitchenInterval enlargement of an intermediate attenuation cystic-appearing lesion in the pancreatic head.  This is associated with pancreatic ductal dilatation.  While this may simply represent an evolving pancreatic pseudocyst in the setting of prior pancreatitis the possibility of a cystic neoplasm obstructing the pancreatic duct is not entirely excluded.  MRCP is recommended.  Cholelithiasis without evidence of acute cholecystitis.  Masslike thickening of the second portion of the duodenum which may simply be reactive in the setting of pancreatitis.  However the possibility of an underlying neoplasm is not excluded."

## 2020-12-29 NOTE — Assessment & Plan Note (Signed)
Heart rate controlled.  Continue metoprolol and Eliquis.  Telemetry.

## 2020-12-29 NOTE — Progress Notes (Signed)
Progress Note    Thomas Coffey   ELT:[id number]  DOB: 1955/05/04  DOA: 12/28/2020     1 Date of Service: 12/29/2020   Brief narrative Thomas Coffey is a 64 y.o. male with medical history significant for paroxysmal atrial fibrillation , coronary artery disease, alcohol use disorder, history of chronic pancreatitis who presented to the ED on 12/27/2020 for evaluation of persistent severe abdominal pain, diffuse but mostly localized to the periumbilical area.  Thomas Coffey reported onset after Thomas Coffey had hot chicken legs, pizza and beer while watching a game 2 days prior.  Evaluation in the ED consistent with acute on chronic pancreatitis with mildly elevated lipase of 128 and CT abdomen/pelvis findings as below.  Also had significant hypokalemia and hypomagnesemia on admission.   ".Marland KitchenMarland KitchenInterval enlargement of an intermediate attenuation cystic-appearing lesion in the pancreatic head.  This is associated with pancreatic ductal dilatation.  While this may simply represent an evolving pancreatic pseudocyst in the setting of prior pancreatitis the possibility of a cystic neoplasm obstructing the pancreatic duct is not entirely excluded.  MRCP is recommended.  Cholelithiasis without evidence of acute cholecystitis.  Masslike thickening of the second portion of the duodenum which may simply be reactive in the setting of pancreatitis.  However the possibility of an underlying neoplasm is not excluded."    Assessment and Plan * Acute on chronic pancreatitis ([place]) Presented with abdominal pain, mildly elevated lipase, history of episodes of acute pancreatitis, CT abdomen pelvis on admission with cystic appearing lesion in the pancreatic head consistent with a likely pseudocyst. Subsequent MRCP confirmed cystic lesion of the pancreatic head with internal debris most compatible with pseudocyst; small fluid collections between the pancreatic head and second portion of the duodenum (sequelae of pancreatitis); diffuse  mild enlargement and irregularity of the main pancreatic duct likely sequelae of chronic pancreatitis; hepatic steatosis. --Continue IV fluids with added KCl -- Start full liquid diet, advance as tolerated -- Antiemetics and pain control as needed -- Monitor abdominal exam closely  Abdominal pain Due to pancreatitis.  Management as outlined. Improved since admission. Monitor with advancing of diet.  Hypokalemia Potassium is being replaced with IV fluid additive.  K improved from 2.6 up to 3.2 today.  Replacement ongoing.  Monitor BMP.  Hypomagnesemia Magnesium on admission was 1.3.  IV replacement is being given.  Monitor and replace further as needed.  AF (paroxysmal atrial fibrillation) (HCC) Heart rate controlled.  Continue metoprolol and Eliquis.  Telemetry.  Alcohol abuse Monitoring on CIWA protocol with as needed Ativan.  Continue folic acid and thiamine supplements, multivitamin.  Tobacco abuse Nicotine patch ordered.  Counseled on the importance of cessation.     Subjective:  Patient awake resting in bed when seen today.  Thomas Coffey reports being hungry and asks if Thomas Coffey can be started on diet today.  Thomas Coffey is specifically requesting sandwich or crackers to eat.  Advised starting with liquids first and Thomas Coffey is agreeable.  Denies any nausea or vomiting.  No other acute complaints.  Objective Vitals:   12/28/20 2130 12/29/20 0414 12/29/20 0811 12/29/20 1605  BP: (!) 156/99 (!) 149/93 (!) 145/87 (!) 145/85  Pulse: (!) 106 90 87 84  Resp:  20 16 17   Temp:  98.1 F (36.7 C) 98.4 F (36.9 C) 99.1 F (37.3 C)  TempSrc:  Oral Oral Oral  SpO2:  100% 98% 98%  Weight:      Height:       68 kg  Vital signs were reviewed and  unremarkable except for: Mildly tachycardic, BP mildly elevated   Exam General exam: awake, alert, no acute distress HEENT: moist mucus membranes, hearing grossly normal  Respiratory system: CTAB, no wheezes, rales or rhonchi, normal respiratory  effort. Cardiovascular system: normal S1/S2, RRR, no JVD, murmurs, rubs, gallops, no pedal edema.   Gastrointestinal system: soft, nontender, nondistended. Central nervous system: A&O x3. no gross focal neurologic deficits, normal speech Extremities: moves all, no edema, normal tone Psychiatry: normal mood, congruent affect, judgement and insight appear normal   Labs / Other Information My review of labs, imaging, notes and other tests is significant for Sodium 134, potassium 3.2, magnesium 1.3, alk phos 147, albumin 3.0, lipase 128, AST 65, indirect bili 1.5, total bili 2.2    MRCP reviewed and results as outlined above  Disposition Plan: Status is: Inpatient  Remains inpatient appropriate because: Severity of illness on IV hydration for acute pancreatitis, electrolyte abnormalities requiring replacement and close monitoring.        Time spent: 30 minutes [facility] 12/29/2020, 5:08 PM

## 2020-12-29 NOTE — Progress Notes (Signed)
Pharmacy Antibiotic Note  Thomas Coffey is a 65 y.o. male with medical history significant for paroxysmal atrial fibrillation , coronary artery disease, alcohol use disorder, history of chronic pancreatitis who presented to the ED for evaluation of persistent severe abdominal pain.  Pharmacy has been consulted for Unasyn dosing for aspiration pneumonia.  Plan: Start Unasyn 3 g IV q6h Monitor renal function and adjust dose as clinically indicated  Height: 5\' 9"  (175.3 cm) Weight: 68 kg (150 lb) IBW/kg (Calculated) : 70.7  Temp (24hrs), Avg:98.5 F (36.9 C), Min:98.1 F (36.7 C), Max:99.1 F (37.3 C)  Recent Labs  Lab 12/27/20 1720 12/29/20 0754  WBC 9.7 8.1  CREATININE 0.57* 0.54*    Estimated Creatinine Clearance: 88.5 mL/min (A) (by C-G formula based on SCr of 0.54 mg/dL (L)).    No Known Allergies  Antimicrobials this admission: 12/14 Unasyn >>    Microbiology results:   Thank you for allowing pharmacy to be a part of this patients care.  1/15 Thomas Coffey 12/29/2020 5:13 PM

## 2020-12-29 NOTE — Assessment & Plan Note (Signed)
-   Nicotine patch ordered - Counseled on the importance of cessation 

## 2020-12-29 NOTE — Assessment & Plan Note (Signed)
Presented with abdominal pain, mildly elevated lipase, history of episodes of acute pancreatitis, CT abdomen pelvis on admission with cystic appearing lesion in the pancreatic head consistent with a likely pseudocyst. Subsequent MRCP confirmed cystic lesion of the pancreatic head with internal debris most compatible with pseudocyst; small fluid collections between the pancreatic head and second portion of the duodenum (sequelae of pancreatitis); diffuse mild enlargement and irregularity of the main pancreatic duct likely sequelae of chronic pancreatitis; hepatic steatosis. --Continue IV fluids with added KCl -- Start full liquid diet, advance as tolerated -- Antiemetics and pain control as needed -- Monitor abdominal exam closely

## 2020-12-29 NOTE — Assessment & Plan Note (Signed)
Monitoring on CIWA protocol with as needed Ativan.  Continue folic acid and thiamine supplements, multivitamin.

## 2020-12-29 NOTE — Assessment & Plan Note (Signed)
Magnesium on admission was 1.3.  IV replacement is being given.  Monitor and replace further as needed.

## 2020-12-29 NOTE — Assessment & Plan Note (Signed)
Potassium is being replaced with IV fluid additive.  K improved from 2.6 up to 3.2 today.  Replacement ongoing.  Monitor BMP.

## 2020-12-30 LAB — CBC
HCT: 37.8 % — ABNORMAL LOW (ref 39.0–52.0)
Hemoglobin: 12.9 g/dL — ABNORMAL LOW (ref 13.0–17.0)
MCH: 33 pg (ref 26.0–34.0)
MCHC: 34.1 g/dL (ref 30.0–36.0)
MCV: 96.7 fL (ref 80.0–100.0)
Platelets: 172 10*3/uL (ref 150–400)
RBC: 3.91 MIL/uL — ABNORMAL LOW (ref 4.22–5.81)
RDW: 15.8 % — ABNORMAL HIGH (ref 11.5–15.5)
WBC: 7 10*3/uL (ref 4.0–10.5)
nRBC: 0 % (ref 0.0–0.2)

## 2020-12-30 LAB — COMPREHENSIVE METABOLIC PANEL
ALT: 16 U/L (ref 0–44)
AST: 49 U/L — ABNORMAL HIGH (ref 15–41)
Albumin: 2.6 g/dL — ABNORMAL LOW (ref 3.5–5.0)
Alkaline Phosphatase: 129 U/L — ABNORMAL HIGH (ref 38–126)
Anion gap: 7 (ref 5–15)
BUN: 5 mg/dL — ABNORMAL LOW (ref 8–23)
CO2: 24 mmol/L (ref 22–32)
Calcium: 8.2 mg/dL — ABNORMAL LOW (ref 8.9–10.3)
Chloride: 102 mmol/L (ref 98–111)
Creatinine, Ser: 0.6 mg/dL — ABNORMAL LOW (ref 0.61–1.24)
GFR, Estimated: >60 mL/min (ref >60)
Glucose, Bld: 132 mg/dL — ABNORMAL HIGH (ref 70–99)
Potassium: 3.5 mmol/L (ref 3.5–5.1)
Sodium: 133 mmol/L — ABNORMAL LOW (ref 135–145)
Total Bilirubin: 1.1 mg/dL (ref 0.3–1.2)
Total Protein: 6.2 g/dL — ABNORMAL LOW (ref 6.5–8.1)

## 2020-12-30 LAB — PROCALCITONIN: Procalcitonin: <0.1 ng/mL

## 2020-12-30 LAB — PHOSPHORUS: Phosphorus: 2.4 mg/dL — ABNORMAL LOW (ref 2.5–4.6)

## 2020-12-30 LAB — MAGNESIUM: Magnesium: 1.7 mg/dL (ref 1.7–2.4)

## 2020-12-30 MED ORDER — DM-GUAIFENESIN ER 30-600 MG PO TB12: 1.0000 | ORAL_TABLET | Freq: Two times a day (BID) | ORAL | 0 refills | Status: AC

## 2020-12-30 MED ORDER — AMLODIPINE BESYLATE 5 MG PO TABS
5.0000 mg | ORAL_TABLET | Freq: Every day | ORAL | Status: DC
  Administered 2020-12-30: 5 mg via ORAL
  Filled 2020-12-30: qty 1

## 2020-12-30 MED ORDER — METOPROLOL TARTRATE 25 MG PO TABS
12.5000 mg | ORAL_TABLET | Freq: Two times a day (BID) | ORAL | 2 refills | Status: DC
Start: 2020-12-30 — End: 2022-03-31

## 2020-12-30 MED ORDER — PANTOPRAZOLE SODIUM 40 MG PO TBEC: 40.0000 mg | DELAYED_RELEASE_TABLET | Freq: Every day | ORAL | 2 refills | Status: DC

## 2020-12-30 MED ORDER — POTASSIUM CHLORIDE CRYS ER 20 MEQ PO TBCR: 10.0000 meq | EXTENDED_RELEASE_TABLET | Freq: Every day | ORAL | 1 refills | Status: DC

## 2020-12-30 MED ORDER — THIAMINE HCL 100 MG PO TABS
100.0000 mg | ORAL_TABLET | Freq: Every day | ORAL | 2 refills | Status: DC
Start: 2020-12-31 — End: 2022-03-31

## 2020-12-30 MED ORDER — ADULT MULTIVITAMIN W/MINERALS CH: 1.0000 | ORAL_TABLET | Freq: Every day | ORAL | 1 refills | Status: DC

## 2020-12-30 MED ORDER — ONDANSETRON HCL 4 MG PO TABS: 4.0000 mg | ORAL_TABLET | Freq: Three times a day (TID) | ORAL | 0 refills | Status: DC | PRN

## 2020-12-30 MED ORDER — AMOXICILLIN-POT CLAVULANATE 875-125 MG PO TABS: 1.0000 | ORAL_TABLET | Freq: Two times a day (BID) | ORAL | 0 refills | Status: AC

## 2020-12-30 MED ORDER — GABAPENTIN 300 MG PO CAPS: 300.0000 mg | ORAL_CAPSULE | Freq: Three times a day (TID) | ORAL | 1 refills | Status: DC

## 2020-12-30 MED ORDER — PANTOPRAZOLE SODIUM 40 MG PO TBEC
40.0000 mg | DELAYED_RELEASE_TABLET | Freq: Every day | ORAL | Status: DC
  Administered 2020-12-30: 40 mg via ORAL
  Filled 2020-12-30: qty 1

## 2020-12-30 MED ORDER — FOLIC ACID 1 MG PO TABS: 1.0000 mg | ORAL_TABLET | Freq: Every day | ORAL | 2 refills | Status: DC

## 2020-12-30 MED ORDER — OXYCODONE HCL 5 MG PO TABS: 5.0000 mg | ORAL_TABLET | Freq: Three times a day (TID) | ORAL | 0 refills | Status: AC | PRN

## 2020-12-30 MED ORDER — AMLODIPINE BESYLATE 5 MG PO TABS: 5.0000 mg | ORAL_TABLET | Freq: Every day | ORAL | 2 refills | Status: DC

## 2020-12-30 MED ORDER — APIXABAN 5 MG PO TABS: 5.0000 mg | ORAL_TABLET | Freq: Two times a day (BID) | ORAL | 0 refills | Status: DC

## 2020-12-30 NOTE — TOC Transition Note (Signed)
Transition of Care [facility]) - CM/SW Discharge Note   Patient Details  Name: Thomas Coffey MRN: [id number] Date of Birth: [birth year]  Transition of Care [facility]) CM/SW Contact:  Chapman Fitch, RN Phone Number: 12/30/2020, 11:55 AM   Clinical Narrative:     Patient to discharge today.  No needs at discharge     Barriers to Discharge: Continued Medical Work up   Patient Goals and CMS Choice        Discharge Placement                       Discharge Plan and Services                                     Social Determinants of Health (SDOH) Interventions     Readmission Risk Interventions Readmission Risk Prevention Plan 12/30/2020  HRI or Home Care Consult Patient refused  Social Work Consult for Recovery Care Planning/Counseling Complete  Palliative Care Screening Not Applicable  Medication Review [employer]) Complete  Some recent data might be hidden

## 2020-12-30 NOTE — Discharge Summary (Signed)
Physician Discharge Summary   Patient name: Thomas Coffey  Admit date:     12/28/2020  Discharge date: 12/30/2020  Discharge Physician: Pennie Banter   PCP: [facility]   Recommendations at discharge:   Follow up CBC/CMP in 1-2 weeks Follow up with PCP in 1-2 weeks Follow up on patient's alcohol consumption and continue to support for cessation.   Discharge Diagnoses Principal Problem:   Acute on chronic pancreatitis (HCC) Active Problems:   Aspiration pneumonia (HCC)   Abdominal pain   Hypomagnesemia   Hypokalemia   AF (paroxysmal atrial fibrillation) (HCC)   Alcohol abuse   Tobacco abuse     Hospital Course   Thomas Coffey is a 65 y.o. male with medical history significant for paroxysmal atrial fibrillation , coronary artery disease, alcohol use disorder, history of chronic pancreatitis who presented to the ED on 12/27/2020 for evaluation of persistent severe abdominal pain, diffuse but mostly localized to the periumbilical area.  He reported onset after he had hot chicken legs, pizza and beer while watching a game 2 days prior.  Evaluation in the ED consistent with acute on chronic pancreatitis with mildly elevated lipase of 128 and CT abdomen/pelvis findings as below.  Also had significant hypokalemia and hypomagnesemia on admission.   ".Marland KitchenMarland KitchenInterval enlargement of an intermediate attenuation cystic-appearing lesion in the pancreatic head.  This is associated with pancreatic ductal dilatation.  While this may simply represent an evolving pancreatic pseudocyst in the setting of prior pancreatitis the possibility of a cystic neoplasm obstructing the pancreatic duct is not entirely excluded.  MRCP is recommended.  Cholelithiasis without evidence of acute cholecystitis.  Masslike thickening of the second portion of the duodenum which may simply be reactive in the setting of pancreatitis.  However the possibility of an underlying neoplasm is not  excluded."    * Acute on chronic pancreatitis (HCC) Presented with abdominal pain, mildly elevated lipase, history of episodes of acute pancreatitis, CT abdomen pelvis on admission with cystic appearing lesion in the pancreatic head consistent with a likely pseudocyst. Subsequent MRCP confirmed cystic lesion of the pancreatic head with internal debris most compatible with pseudocyst; small fluid collections between the pancreatic head and second portion of the duodenum (sequelae of pancreatitis); diffuse mild enlargement and irregularity of the main pancreatic duct likely sequelae of chronic pancreatitis; hepatic steatosis. --Treated with IV fluids with added KCl --Tolerated advanced diet well without N/V or increased pain -- Antiemetics and pain control as needed   Patient is clinically improved, tolerating PO intake and off IV fluids and medications.  He requests discharge home today and stable for discharge.  Pt counseled extensively to avoid alcohol to prevent recurrent pancreatitis, and to eat a low fat diet.   Abdominal pain Due to pancreatitis.  Management as outlined. Improved since admission. Monitor with advancing of diet.   Hypokalemia Potassium was replaced with IV fluid additive.  K improved from 2.6 >> 3.2 >> 3.5 today.  Monitor BMP.   Hypomagnesemia Magnesium on admission was 1.3.  IV replacement was given.  Monitor and replace further as needed.   AF (paroxysmal atrial fibrillation) (HCC) Heart rate controlled.  Continue metoprolol and Eliquis.  Telemetry.   Alcohol abuse Monitored on CIWA protocol with as needed Ativan.  Continue folic acid and thiamine supplements, multivitamin. Resume gabapentin at discharge.   Tobacco abuse Nicotine patch ordered.  Counseled on the importance of cessation.       Procedures performed: None   Condition  at discharge: stable  Exam Physical Exam  General exam: awake, alert, no acute distress, chronically  ill-appearing Respiratory system: CTAB, no wheezes, rales or rhonchi, normal respiratory effort. Cardiovascular system: normal S1/S2, RRR, no pedal edema.   Gastrointestinal system: soft, NT, ND Central nervous system: A&O x3. no gross focal neurologic deficits, normal speech Extremities: moves all, no edema, normal tone Skin: dry, intact, normal temperature Psychiatry: normal mood, congruent affect  Disposition: Home  Discharge time: less than 30 minutes.   Allergies as of 12/30/2020   No Known Allergies      Medication List     TAKE these medications    amLODipine 5 MG tablet Commonly known as: NORVASC Take 1 tablet (5 mg total) by mouth daily. Start taking on: December 31, 2020   amoxicillin-clavulanate 875-125 MG tablet Commonly known as: Augmentin Take 1 tablet by mouth every 12 (twelve) hours for 4 days.   apixaban 5 MG Tabs tablet Commonly known as: ELIQUIS Take 1 tablet (5 mg total) by mouth 2 (two) times daily. Only start taking this script after you have completed 10mg  BID x 7 days   dextromethorphan-guaiFENesin 30-600 MG 12hr tablet Commonly known as: MUCINEX DM Take 1 tablet by mouth 2 (two) times daily for 10 days.   folic acid 1 MG tablet Commonly known as: FOLVITE Take 1 tablet (1 mg total) by mouth daily. Start taking on: December 31, 2020   gabapentin 300 MG capsule Commonly known as: NEURONTIN Take 1 capsule (300 mg total) by mouth 3 (three) times daily.   metoprolol tartrate 25 MG tablet Commonly known as: LOPRESSOR Take 0.5 tablets (12.5 mg total) by mouth 2 (two) times daily.   multivitamin with minerals Tabs tablet Take 1 tablet by mouth daily.   ondansetron 4 MG tablet Commonly known as: Zofran Take 1 tablet (4 mg total) by mouth every 8 (eight) hours as needed for nausea or vomiting.   oxyCODONE 5 MG immediate release tablet Commonly known as: Roxicodone Take 1-2 tablets (5-10 mg total) by mouth every 8 (eight) hours as needed for  up to 5 days.   pantoprazole 40 MG tablet Commonly known as: PROTONIX Take 1 tablet (40 mg total) by mouth daily. Start taking on: December 31, 2020   potassium chloride SA 20 MEQ tablet Commonly known as: KLOR-CON M Take 0.5 tablets (10 mEq total) by mouth daily. What changed:  medication strength when to take this   thiamine 100 MG tablet Take 1 tablet (100 mg total) by mouth daily. Start taking on: December 31, 2020        DG Chest 2 View  Result Date: 12/27/2020 CLINICAL DATA:  Chest pain body aches EXAM: CHEST - 2 VIEW COMPARISON:  11/12/2020 FINDINGS: Vague left lower lung opacity. No pleural effusion. Normal cardiomediastinal silhouette with aortic atherosclerosis. No pneumothorax. IMPRESSION: Vague left lower lung opacity, indeterminate for pneumonia or other airspace opacity. If symptoms are suggestive of respiratory infection, would obtain short interval radiographic follow-up after adequate therapy. Otherwise chest CT could be considered for further evaluation. Electronically Signed   By: 11/14/2020 M.D.   On: 12/27/2020 17:52   CT ABDOMEN PELVIS W CONTRAST  Result Date: 12/28/2020 CLINICAL DATA:  65 year old male with history of acute onset of nonlocalized abdominal pain. EXAM: CT ABDOMEN AND PELVIS WITH CONTRAST TECHNIQUE: Multidetector CT imaging of the abdomen and pelvis was performed using the standard protocol following bolus administration of intravenous contrast. CONTRAST:  76 OMNIPAQUE IOHEXOL 300 MG/ML  SOLN COMPARISON:  CT the abdomen and pelvis 07/04/2020. FINDINGS: Lower chest: Patchy multifocal ground-glass attenuation airspace disease in the lung bases bilaterally, most severe in the periphery of the right lower lobe, new compared to the prior examination. Atherosclerotic calcifications in the descending thoracic aorta as well as the left circumflex and right coronary arteries. Hepatobiliary: There is heterogeneous attenuation throughout the hepatic  parenchyma with ill-defined areas of low-attenuation noted, most evident throughout the right lobe of the liver. These are poorly characterized on today's CT examination. No intrahepatic biliary ductal dilatation. Small calcified gallstones lying dependently in the gallbladder measuring up to 6 mm. Gallbladder is moderately distended. Gallbladder wall thickness is normal. No pericholecystic fluid or surrounding inflammatory changes. 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 Atrophy throughout the body and tail of the pancreas where there is diffuse ductal dilatation measuring up to 5 mm in the proximal pancreatic body. There is a small amount of peripancreatic inflammatory changes, which could suggest some residual pancreatitis. No well-defined peripancreatic fluid collections are otherwise noted to suggest additional pseudocyst. Spleen: Unremarkable. Adrenals/Urinary Tract: Multifocal cortical thinning in both kidneys. No suspicious renal lesions. No hydroureteronephrosis. Bilateral adrenal glands are normal in appearance. Urinary bladder is normal in appearance. Stomach/Bowel: The appearance of the stomach is normal. Profound thickening of the duodenal wall, most evident in the second portion of the duodenum, best appreciated on axial image 30 of series 2 where this is somewhat mass-like in appearance. No pathologic dilatation of small bowel or colon. Status post right hemicolectomy. Vascular/Lymphatic: Aortic atherosclerosis, without evidence of aneurysm or dissection in the abdominal or pelvic vasculature. No definite lymphadenopathy noted in the abdomen or pelvis. Reproductive: Prostate gland and seminal vesicles are unremarkable in appearance. Other: No significant volume of ascites.  No  pneumoperitoneum. Musculoskeletal: There are no aggressive appearing lytic or blastic lesions noted in the visualized portions of the skeleton. IMPRESSION: 1. Interval enlargement of a intermediate attenuation cystic appearing lesion in the pancreatic head. This is associated with pancreatic ductal dilatation. While this may simply represent an evolving pancreatic pseudocyst in the setting of prior pancreatitis, the possibility of a cystic neoplasm obstructing the pancreatic duct is not entirely excluded. Accordingly, further evaluation with abdominal MRI with and without IV gadolinium with MRCP is recommended at this time. 2. The appearance of the liver is very heterogeneous. Although the appearance is favored to reflect heterogeneous hepatic steatosis, the possibility of one or more infiltrative lesions (i.e., metastatic lesions) is difficult to exclude on today's examination. Attention at time of forthcoming abdominal MRI is recommended to better evaluate these findings. 3. Cholelithiasis without evidence of acute cholecystitis at this time. No definitive evidence of choledocholithiasis. 4. Mass-like thickening of the second portion of the duodenum. Attention at forthcoming abdominal MRI is recommended. This may simply be reactive in the setting of pancreatitis, however, the possibility of underlying neoplasm is not excluded. Further evaluation with endoscopy may be warranted if clinically appropriate. 5. Patchy areas of peripheral predominant ground-glass attenuation in the visualize lung bases, concerning for multilobar bilateral bronchopneumonia. 6. Aortic atherosclerosis. 7. Additional incidental findings, as above. Electronically Signed   By: Trudie Reed M.D.   On: 12/28/2020 07:02   MR 3D Recon At Scanner  Result Date: 12/28/2020 CLINICAL DATA:  Jaundice, history of pancreatitis. EXAM:  MRI ABDOMEN WITHOUT AND WITH CONTRAST (INCLUDING MRCP) TECHNIQUE: Multiplanar multisequence MR imaging of the  abdomen was performed both before and after the administration of intravenous contrast. Heavily T2-weighted images of the biliary and pancreatic ducts were obtained, and three-dimensional MRCP images were rendered by post processing. CONTRAST:  7mL GADAVIST GADOBUTROL 1 MMOL/ML IV SOLN COMPARISON:  None. FINDINGS: Lower chest: Bilateral lower lung opacities. Hepatobiliary: Heterogeneous enhancement of the liver with geographic areas of signal loss, compatible with hepatic steatosis. Hydropic gallbladder. Cholelithiasis. No gallbladder wall thickening. Normal caliber common bile duct. Pancreas: Unilocular cystic lesion of the pancreatic head measuring 3.6 x 3.0 cm on series 4, image 25 with enhancing wall and internal debris. No evidence of internal enhancement. Dilated and diffusely irregular main pancreatic duct, measures up to 5 mm. No peripancreatic inflammation. Spleen:  Within normal limits in size and appearance. Adrenals/Urinary Tract: No masses identified. No evidence of hydronephrosis. Stomach/Bowel: Small fluid collections are seen between the pancreatic head and duodenum. Reference. What non fluid collection measuring 2.0 x 0.9 cm on series 4, image 30. Vascular/Lymphatic: No pathologically enlarged lymph nodes identified. No abdominal aortic aneurysm demonstrated. Other:  None. Musculoskeletal: No suspicious bone lesions identified. IMPRESSION: 1. Cystic lesion of the pancreatic head with internal debris, findings are most compatible with a pseudocyst given history of pancreatitis. 2. Additional small fluid collections are seen between the pancreatic head and second portion of the duodenum which are sequela of pancreatitis. 3. Diffuse mild enlargement and irregularity of the main pancreatic duct, likely sequela of chronic pancreatitis. 4. Heterogeneous appearance of the liver with geographic areas of signal loss, compatible with hepatic steatosis. 5. Bilateral lower lung opacities, possibly due to  infection or aspiration. Electronically Signed   By: Allegra Lai M.D.   On: 12/28/2020 15:59   MR ABDOMEN MRCP W WO CONTAST  Result Date: 12/28/2020 CLINICAL DATA:  Jaundice, history of pancreatitis. EXAM: MRI ABDOMEN WITHOUT AND WITH CONTRAST (INCLUDING MRCP) TECHNIQUE: Multiplanar multisequence MR imaging of the abdomen was performed both before and after the administration of intravenous contrast. Heavily T2-weighted images of the biliary and pancreatic ducts were obtained, and three-dimensional MRCP images were rendered by post processing. CONTRAST:  7mL GADAVIST GADOBUTROL 1 MMOL/ML IV SOLN COMPARISON:  None. FINDINGS: Lower chest: Bilateral lower lung opacities. Hepatobiliary: Heterogeneous enhancement of the liver with geographic areas of signal loss, compatible with hepatic steatosis. Hydropic gallbladder. Cholelithiasis. No gallbladder wall thickening. Normal caliber common bile duct. Pancreas: Unilocular cystic lesion of the pancreatic head measuring 3.6 x 3.0 cm on series 4, image 25 with enhancing wall and internal debris. No evidence of internal enhancement. Dilated and diffusely irregular main pancreatic duct, measures up to 5 mm. No peripancreatic inflammation. Spleen:  Within normal limits in size and appearance. Adrenals/Urinary Tract: No masses identified. No evidence of hydronephrosis. Stomach/Bowel: Small fluid collections are seen between the pancreatic head and duodenum. Reference. What non fluid collection measuring 2.0 x 0.9 cm on series 4, image 30. Vascular/Lymphatic: No pathologically enlarged lymph nodes identified. No abdominal aortic aneurysm demonstrated. Other:  None. Musculoskeletal: No suspicious bone lesions identified. IMPRESSION: 1. Cystic lesion of the pancreatic head with internal debris, findings are most compatible with a pseudocyst given history of pancreatitis. 2. Additional small fluid collections are seen between the pancreatic head and second portion of the  duodenum which are sequela of pancreatitis. 3. Diffuse mild enlargement and irregularity of the main pancreatic duct, likely sequela of chronic pancreatitis. 4. Heterogeneous appearance of the liver with geographic  areas of signal loss, compatible with hepatic steatosis. 5. Bilateral lower lung opacities, possibly due to infection or aspiration. Electronically Signed   By: Allegra Lai M.D.   On: 12/28/2020 15:59   Results for orders placed or performed during the hospital encounter of 12/28/20  Resp Panel by RT-PCR (Flu A&B, Covid) Nasopharyngeal Swab     Status: None   Collection Time: 12/27/20 10:50 PM   Specimen: Nasopharyngeal Swab; Nasopharyngeal(NP) swabs in vial transport medium  Result Value Ref Range Status   SARS Coronavirus 2 by RT PCR NEGATIVE NEGATIVE Final    Comment: (NOTE) SARS-CoV-2 target nucleic acids are NOT DETECTED.  The SARS-CoV-2 RNA is generally detectable in upper respiratory specimens during the acute phase of infection. The lowest concentration of SARS-CoV-2 viral copies this assay can detect is 138 copies/mL. A negative result does not preclude SARS-Cov-2 infection and should not be used as the sole basis for treatment or other patient management decisions. A negative result may occur with  improper specimen collection/handling, submission of specimen other than nasopharyngeal swab, presence of viral mutation(s) within the areas targeted by this assay, and inadequate number of viral copies(<138 copies/mL). A negative result must be combined with clinical observations, patient history, and epidemiological information. The expected result is Negative.  Fact Sheet for Patients:  BloggerCourse.com  Fact Sheet for Healthcare Providers:  SeriousBroker.it  This test is no t yet approved or cleared by the [place] FDA and  has been authorized for detection and/or diagnosis of SARS-CoV-2 by FDA under an  Emergency Use Authorization (EUA). This EUA will remain  in effect (meaning this test can be used) for the duration of the COVID-19 declaration under Section 564(b)(1) of the Act, 21 U.S.C.section 360bbb-3(b)(1), unless the authorization is terminated  or revoked sooner.       Influenza A by PCR NEGATIVE NEGATIVE Final   Influenza B by PCR NEGATIVE NEGATIVE Final    Comment: (NOTE) The Xpert Xpress SARS-CoV-2/FLU/RSV plus assay is intended as an aid in the diagnosis of influenza from Nasopharyngeal swab specimens and should not be used as a sole basis for treatment. Nasal washings and aspirates are unacceptable for Xpert Xpress SARS-CoV-2/FLU/RSV testing.  Fact Sheet for Patients: BloggerCourse.com  Fact Sheet for Healthcare Providers: SeriousBroker.it  This test is not yet approved or cleared by the [place] FDA and has been authorized for detection and/or diagnosis of SARS-CoV-2 by FDA under an Emergency Use Authorization (EUA). This EUA will remain in effect (meaning this test can be used) for the duration of the COVID-19 declaration under Section 564(b)(1) of the Act, 21 U.S.C. section 360bbb-3(b)(1), unless the authorization is terminated or revoked.  Performed at [facility], [address] [place] [place], [place] [address]     Signed:  Pennie Banter MD.  [facility] 12/30/2020, 11:00 AM

## 2020-12-30 NOTE — Progress Notes (Addendum)
AVS given and reviewed with pt. Medications discussed. Printed prescription provided to pt. Pt belongings returned from security. All questions answered to satisfaction. Pt verbalized understanding of information given. Pt to be escorted off the unit with all belongings via wheelchair by volunteer services.

## 2020-12-30 NOTE — Plan of Care (Signed)

## 2021-01-25 ENCOUNTER — Other Ambulatory Visit: Payer: Self-pay

## 2021-01-25 DIAGNOSIS — Z1211 Encounter for screening for malignant neoplasm of colon: Secondary | ICD-10-CM

## 2021-01-25 MED ORDER — PEG 3350-KCL-NA BICARB-NACL 420 G PO SOLR: 4000.0000 mL | Freq: Once | ORAL | 0 refills | Status: AC

## 2021-01-27 ENCOUNTER — Other Ambulatory Visit: Payer: Self-pay | Admitting: Student

## 2021-01-27 DIAGNOSIS — R9389 Abnormal findings on diagnostic imaging of other specified body structures: Secondary | ICD-10-CM

## 2021-02-14 ENCOUNTER — Telehealth: Payer: Medicare Other

## 2021-02-14 NOTE — Telephone Encounter (Signed)
Called patient and rescheduled to get clearance back

## 2021-02-15 ENCOUNTER — Telehealth: Payer: Self-pay

## 2021-02-15 NOTE — Telephone Encounter (Signed)
Refaxed blood thinner °

## 2021-02-17 ENCOUNTER — Telehealth: Payer: Self-pay

## 2021-02-17 NOTE — Telephone Encounter (Signed)
Patient stated he is not taking the eliquis anymore at all

## 2021-02-18 ENCOUNTER — Ambulatory Visit: Payer: Medicare Other | Attending: Student

## 2021-02-22 ENCOUNTER — Telehealth: Payer: Self-pay

## 2021-02-22 NOTE — Telephone Encounter (Signed)
Called patient to see how he is getting eliquis he states he gets them from hospital but he does not take any of them at home his son told him if he takes them and gets hurt he will bleed to death so I clarified 3 times patient states he does not take the eliquis and he has 2 unused bottles at home hes not gonna take either

## 2021-03-01 ENCOUNTER — Ambulatory Visit
Admission: RE | Admit: 2021-03-01 | Discharge: 2021-03-01 | Disposition: A | Discharge status: Home / Self Care | Payer: Medicare Other | Source: Ambulatory Visit | Attending: Gastroenterology | Admitting: Gastroenterology

## 2021-03-01 ENCOUNTER — Ambulatory Visit: Payer: Medicare Other | Admitting: Certified Registered Nurse Anesthetist

## 2021-03-01 ENCOUNTER — Encounter
Admission: RE | Disposition: A | Discharge status: Home / Self Care | Payer: Self-pay | Source: Ambulatory Visit | Attending: Gastroenterology

## 2021-03-01 ENCOUNTER — Other Ambulatory Visit: Payer: Self-pay

## 2021-03-01 DIAGNOSIS — Z1211 Encounter for screening for malignant neoplasm of colon: Secondary | ICD-10-CM

## 2021-03-01 SURGERY — COLONOSCOPY WITH PROPOFOL
Anesthesia: General

## 2021-03-01 MED ORDER — PEG 3350-KCL-NA BICARB-NACL 420 G PO SOLR: 4000.0000 mL | Freq: Once | ORAL | 0 refills | Status: AC

## 2021-03-01 MED ORDER — GLYCOPYRROLATE 0.2 MG/ML IJ SOLN
INTRAMUSCULAR | Status: AC
  Filled 2021-03-01: qty 1

## 2021-03-01 MED ORDER — PHENYLEPHRINE HCL (PRESSORS) 10 MG/ML IV SOLN
INTRAVENOUS | Status: AC
  Filled 2021-03-01: qty 1

## 2021-03-01 MED ORDER — LIDOCAINE HCL (PF) 2 % IJ SOLN
INTRAMUSCULAR | Status: AC
  Filled 2021-03-01: qty 5

## 2021-03-01 MED ORDER — SODIUM CHLORIDE 0.9 % IV SOLN: INTRAVENOUS | Status: DC

## 2021-03-01 MED ORDER — PROPOFOL 500 MG/50ML IV EMUL
INTRAVENOUS | Status: AC
  Filled 2021-03-01: qty 200

## 2021-03-01 NOTE — Anesthesia Preprocedure Evaluation (Deleted)
Anesthesia Evaluation  Patient identified by MRN, date of birth, ID band Patient awake    Reviewed: Allergy & Precautions, NPO status , Patient's Chart, lab work & pertinent test results, reviewed documented beta blocker date and time   History of Anesthesia Complications Negative for: history of anesthetic complications  Airway Mallampati: II  TM Distance: >3 FB     Dental  (+) Upper Dentures, Lower Dentures   Pulmonary Current Smoker,    Pulmonary exam normal        Cardiovascular Exercise Tolerance: Good + CAD and + Past MI (about 1 year ago, no stenting)  Normal cardiovascular exam+ dysrhythmias Atrial Fibrillation      Neuro/Psych negative neurological ROS     GI/Hepatic GERD  Controlled and Medicated,(+)     substance abuse  alcohol use and marijuana use, Non-occlusive portal vein thrombosis, on Eliquis chronic pancreatitis    Endo/Other    Renal/GU      Musculoskeletal   Abdominal   Peds  Hematology   Anesthesia Other Findings   Reproductive/Obstetrics                             Anesthesia Physical  Anesthesia Plan  ASA: 3  Anesthesia Plan: General   Post-op Pain Management:    Induction: Intravenous  PONV Risk Score and Plan: 0 and Propofol infusion  Airway Management Planned: Natural Airway and Simple Face Mask  Additional Equipment:   Intra-op Plan:   Post-operative Plan:   Informed Consent:   Plan Discussed with:   Anesthesia Plan Comments:         Anesthesia Quick Evaluation

## 2021-03-01 NOTE — Addendum Note (Signed)
Addended by: Linward Foster on: 03/01/2021 03:17 PM   Modules accepted: Orders

## 2021-04-12 ENCOUNTER — Telehealth: Payer: Self-pay

## 2021-04-12 NOTE — Telephone Encounter (Signed)
Called endo and canceled procedure patient stated he has not done any bprep he does not have prep med and he did not read prep instructions and he was riding bus to appointment again so I let patient know we would not be able to do his colonoscopy and when he got ready to reschedule I would reschedule patient hung up phone on me I called endo and canceled appointment  ?

## 2021-04-13 ENCOUNTER — Ambulatory Visit: Admission: RE | Admit: 2021-04-13 | Payer: Medicare Other | Source: Ambulatory Visit | Admitting: Gastroenterology

## 2021-04-13 ENCOUNTER — Encounter: Admission: RE | Payer: Self-pay | Source: Ambulatory Visit

## 2021-04-13 SURGERY — COLONOSCOPY WITH PROPOFOL
Anesthesia: General

## 2021-05-18 ENCOUNTER — Inpatient Hospital Stay
Admission: EM | Admit: 2021-05-18 | Discharge: 2021-05-25 | DRG: 439 | Disposition: A | Discharge status: Home / Self Care | Payer: Medicare Other | Attending: Osteopathic Medicine | Admitting: Osteopathic Medicine

## 2021-05-18 ENCOUNTER — Other Ambulatory Visit: Payer: Self-pay

## 2021-05-18 DIAGNOSIS — K861 Other chronic pancreatitis: Secondary | ICD-10-CM | POA: Diagnosis present

## 2021-05-18 DIAGNOSIS — K7010 Alcoholic hepatitis without ascites: Secondary | ICD-10-CM | POA: Diagnosis present

## 2021-05-18 DIAGNOSIS — K8681 Exocrine pancreatic insufficiency: Secondary | ICD-10-CM | POA: Diagnosis present

## 2021-05-18 DIAGNOSIS — Z8616 Personal history of COVID-19: Secondary | ICD-10-CM

## 2021-05-18 DIAGNOSIS — I480 Paroxysmal atrial fibrillation: Secondary | ICD-10-CM | POA: Diagnosis present

## 2021-05-18 DIAGNOSIS — R109 Unspecified abdominal pain: Secondary | ICD-10-CM | POA: Diagnosis present

## 2021-05-18 DIAGNOSIS — I2510 Atherosclerotic heart disease of native coronary artery without angina pectoris: Secondary | ICD-10-CM | POA: Diagnosis present

## 2021-05-18 DIAGNOSIS — K5732 Diverticulitis of large intestine without perforation or abscess without bleeding: Secondary | ICD-10-CM | POA: Diagnosis present

## 2021-05-18 DIAGNOSIS — N400 Enlarged prostate without lower urinary tract symptoms: Secondary | ICD-10-CM | POA: Diagnosis present

## 2021-05-18 DIAGNOSIS — L03119 Cellulitis of unspecified part of limb: Secondary | ICD-10-CM

## 2021-05-18 DIAGNOSIS — Y908 Blood alcohol level of 240 mg/100 ml or more: Secondary | ICD-10-CM | POA: Diagnosis present

## 2021-05-18 DIAGNOSIS — Z9842 Cataract extraction status, left eye: Secondary | ICD-10-CM

## 2021-05-18 DIAGNOSIS — K5900 Constipation, unspecified: Secondary | ICD-10-CM | POA: Diagnosis present

## 2021-05-18 DIAGNOSIS — Z961 Presence of intraocular lens: Secondary | ICD-10-CM | POA: Diagnosis present

## 2021-05-18 DIAGNOSIS — I252 Old myocardial infarction: Secondary | ICD-10-CM

## 2021-05-18 DIAGNOSIS — Z781 Physical restraint status: Secondary | ICD-10-CM

## 2021-05-18 DIAGNOSIS — L03112 Cellulitis of left axilla: Secondary | ICD-10-CM | POA: Diagnosis present

## 2021-05-18 DIAGNOSIS — I10 Essential (primary) hypertension: Secondary | ICD-10-CM | POA: Diagnosis present

## 2021-05-18 DIAGNOSIS — K760 Fatty (change of) liver, not elsewhere classified: Secondary | ICD-10-CM | POA: Diagnosis present

## 2021-05-18 DIAGNOSIS — K8020 Calculus of gallbladder without cholecystitis without obstruction: Secondary | ICD-10-CM | POA: Diagnosis present

## 2021-05-18 DIAGNOSIS — F10221 Alcohol dependence with intoxication delirium: Secondary | ICD-10-CM | POA: Diagnosis present

## 2021-05-18 DIAGNOSIS — R197 Diarrhea, unspecified: Secondary | ICD-10-CM

## 2021-05-18 DIAGNOSIS — F10931 Alcohol use, unspecified with withdrawal delirium: Secondary | ICD-10-CM

## 2021-05-18 DIAGNOSIS — F17210 Nicotine dependence, cigarettes, uncomplicated: Secondary | ICD-10-CM | POA: Diagnosis present

## 2021-05-18 DIAGNOSIS — K859 Acute pancreatitis without necrosis or infection, unspecified: Secondary | ICD-10-CM | POA: Diagnosis not present

## 2021-05-18 DIAGNOSIS — S30861A Insect bite (nonvenomous) of abdominal wall, initial encounter: Secondary | ICD-10-CM | POA: Diagnosis present

## 2021-05-18 DIAGNOSIS — S40862A Insect bite (nonvenomous) of left upper arm, initial encounter: Secondary | ICD-10-CM | POA: Diagnosis present

## 2021-05-18 DIAGNOSIS — D649 Anemia, unspecified: Secondary | ICD-10-CM | POA: Diagnosis present

## 2021-05-18 DIAGNOSIS — E876 Hypokalemia: Secondary | ICD-10-CM | POA: Diagnosis not present

## 2021-05-18 DIAGNOSIS — R160 Hepatomegaly, not elsewhere classified: Secondary | ICD-10-CM | POA: Diagnosis present

## 2021-05-18 DIAGNOSIS — R1084 Generalized abdominal pain: Secondary | ICD-10-CM

## 2021-05-18 DIAGNOSIS — R7989 Other specified abnormal findings of blood chemistry: Secondary | ICD-10-CM | POA: Diagnosis present

## 2021-05-18 DIAGNOSIS — Z9841 Cataract extraction status, right eye: Secondary | ICD-10-CM

## 2021-05-18 DIAGNOSIS — F10231 Alcohol dependence with withdrawal delirium: Secondary | ICD-10-CM | POA: Diagnosis not present

## 2021-05-18 DIAGNOSIS — K529 Noninfective gastroenteritis and colitis, unspecified: Secondary | ICD-10-CM | POA: Diagnosis not present

## 2021-05-18 DIAGNOSIS — K863 Pseudocyst of pancreas: Secondary | ICD-10-CM | POA: Diagnosis present

## 2021-05-18 DIAGNOSIS — Z9049 Acquired absence of other specified parts of digestive tract: Secondary | ICD-10-CM

## 2021-05-18 DIAGNOSIS — Z79899 Other long term (current) drug therapy: Secondary | ICD-10-CM

## 2021-05-18 DIAGNOSIS — W57XXXA Bitten or stung by nonvenomous insect and other nonvenomous arthropods, initial encounter: Secondary | ICD-10-CM | POA: Diagnosis present

## 2021-05-18 DIAGNOSIS — K852 Alcohol induced acute pancreatitis without necrosis or infection: Principal | ICD-10-CM

## 2021-05-18 LAB — COMPREHENSIVE METABOLIC PANEL
ALT: 24 U/L (ref 0–44)
AST: 161 U/L — ABNORMAL HIGH (ref 15–41)
Albumin: 2.7 g/dL — ABNORMAL LOW (ref 3.5–5.0)
Alkaline Phosphatase: 315 U/L — ABNORMAL HIGH (ref 38–126)
Anion gap: 11 (ref 5–15)
BUN: <5 mg/dL — ABNORMAL LOW (ref 8–23)
CO2: 22 mmol/L (ref 22–32)
Calcium: 8.2 mg/dL — ABNORMAL LOW (ref 8.9–10.3)
Chloride: 107 mmol/L (ref 98–111)
Creatinine, Ser: 0.56 mg/dL — ABNORMAL LOW (ref 0.61–1.24)
GFR, Estimated: >60 mL/min (ref >60)
Glucose, Bld: 91 mg/dL (ref 70–99)
Potassium: 4 mmol/L (ref 3.5–5.1)
Sodium: 140 mmol/L (ref 135–145)
Total Bilirubin: 1.4 mg/dL — ABNORMAL HIGH (ref 0.3–1.2)
Total Protein: 6.8 g/dL (ref 6.5–8.1)

## 2021-05-18 LAB — LIPASE, BLOOD: Lipase: 99 U/L — ABNORMAL HIGH (ref 11–51)

## 2021-05-18 MED ORDER — HYDROCODONE-ACETAMINOPHEN 5-325 MG PO TABS
1.0000 | ORAL_TABLET | ORAL | Status: AC
  Administered 2021-05-18: 1 via ORAL
  Filled 2021-05-18: qty 1

## 2021-05-18 MED ORDER — SULFAMETHOXAZOLE-TRIMETHOPRIM 800-160 MG PO TABS
1.0000 | ORAL_TABLET | Freq: Once | ORAL | Status: AC
Start: 2021-05-18 — End: 2021-05-18
  Administered 2021-05-18: 1 via ORAL
  Filled 2021-05-18: qty 1

## 2021-05-18 MED ORDER — SULFAMETHOXAZOLE-TRIMETHOPRIM 800-160 MG PO TABS: 1.0000 | ORAL_TABLET | Freq: Two times a day (BID) | ORAL | 0 refills | Status: DC

## 2021-05-18 NOTE — ED Triage Notes (Signed)
Pt arrive with c/o insect bite on his leg and ABD that happened 4 days ago. Per pt, the bites are painful to touch.  ?

## 2021-05-18 NOTE — ED Provider Notes (Addendum)
?[facility] EMERGENCY DEPARTMENT ?Provider Note ? ? ?CSN: [id number] ?Arrival date & time: 05/18/21  2031 ? ?  ? ?History ? ?Chief Complaint  ?Patient presents with  ? Insect Bite  ? ? ?Thomas Coffey is a 66 y.o. male presents to the emergency department valuation of multiple insect bites to his left axilla, abdomen and right leg.  Patient complains of sore to the left umbilical area and left axillary area.  He states he has had a little bit of drainage from the umbilical wound.  No fevers, chills.  Pain is moderate.  Denies any recent antibiotic use.  Patient states bites occurred today. ? ?Patient also mentioning diffuse abdominal pain nausea and vomiting.  States he is having severe abdominal pain x1 week, loss of appetite with vomiting in the morning upon awakening.  He has had some subjective fevers.  Denies any diarrhea.  Has a history of pancreatitis, hepatitis, A-fib, alcohol abuse. ? ?HPI ? ?  ? ?Home Medications ?Prior to Admission medications   ?Medication Sig Start Date End Date Taking? Authorizing Provider  ?sulfamethoxazole-trimethoprim (BACTRIM DS) 800-160 MG tablet Take 1 tablet by mouth 2 (two) times daily. 05/18/21  Yes Evon Slack, PA-C  ?amLODipine (NORVASC) 5 MG tablet Take 1 tablet (5 mg total) by mouth daily. 12/31/20   Pennie Banter, DO  ?folic acid (FOLVITE) 1 MG tablet Take 1 tablet (1 mg total) by mouth daily. 12/31/20   Pennie Banter, DO  ?gabapentin (NEURONTIN) 300 MG capsule Take 1 capsule (300 mg total) by mouth 3 (three) times daily. 12/30/20   Pennie Banter, DO  ?metoprolol tartrate (LOPRESSOR) 25 MG tablet Take 0.5 tablets (12.5 mg total) by mouth 2 (two) times daily. 12/30/20   Pennie Banter, DO  ?Multiple Vitamin (MULTIVITAMIN WITH MINERALS) TABS tablet Take 1 tablet by mouth daily. 12/30/20   Pennie Banter, DO  ?ondansetron (ZOFRAN) 4 MG tablet Take 1 tablet (4 mg total) by mouth every 8 (eight) hours as needed for nausea or vomiting.  12/30/20   Esaw Grandchild A, DO  ?pantoprazole (PROTONIX) 40 MG tablet Take 1 tablet (40 mg total) by mouth daily. 12/31/20   Pennie Banter, DO  ?potassium chloride (KLOR-CON M) 20 MEQ tablet Take 0.5 tablets (10 mEq total) by mouth daily. 12/30/20   Esaw Grandchild A, DO  ?thiamine 100 MG tablet Take 1 tablet (100 mg total) by mouth daily. 12/31/20   Pennie Banter, DO  ?   ? ?Allergies    ?Patient has no known allergies.   ? ?Review of Systems   ?Review of Systems ? ?Physical Exam ?Updated Vital Signs ?BP 121/78   Pulse 86   Temp 97.9 ?F (36.6 ?C) (Oral)   Resp 16   Ht 5\' 9"  (1.753 m)   Wt 70.3 kg   SpO2 95%   BMI 22.89 kg/m?  ?Physical Exam ?Constitutional:   ?   Appearance: He is well-developed.  ?HENT:  ?   Head: Normocephalic and atraumatic.  ?Eyes:  ?   Conjunctiva/sclera: Conjunctivae normal.  ?Cardiovascular:  ?   Rate and Rhythm: Normal rate.  ?Pulmonary:  ?   Effort: Pulmonary effort is normal. No respiratory distress.  ?Abdominal:  ?   General: There is no distension.  ?   Palpations: Abdomen is soft.  ?   Tenderness: There is abdominal tenderness. There is guarding. There is no right CVA tenderness or left CVA tenderness.  ?   Comments: Umbilicus  with 1 x 1 cm area of slight induration with scab formation, minimal bloody drainage from the umbilicus.  Patient tender to palpation along the abdomen but patient's pain is diffuse and out of proportion to exam findings  ?Musculoskeletal:     ?   General: Normal range of motion.  ?   Cervical back: Normal range of motion.  ?   Comments: Left axilla with 2 x 2 cm macular erythematous rash, minimal induration with no fluctuance, no active drainage. ? ?  ?Skin: ?   General: Skin is warm.  ?   Findings: No rash.  ?Neurological:  ?   Mental Status: He is alert and oriented to person, place, and time.  ?Psychiatric:     ?   Behavior: Behavior normal.     ?   Thought Content: Thought content normal.  ? ? ?ED Results / Procedures / Treatments    ?Labs ?(all labs ordered are listed, but only abnormal results are displayed) ?Labs Reviewed  ?CBC  ?COMPREHENSIVE METABOLIC PANEL  ?LIPASE, BLOOD  ? ? ?EKG ?None ? ?Radiology ?No results found. ? ?Procedures ?Procedures  ? ? ?Medications Ordered in ED ?Medications  ?HYDROcodone-acetaminophen (NORCO/VICODIN) 5-325 MG per tablet 1 tablet (1 tablet Oral Given 05/18/21 2204)  ?sulfamethoxazole-trimethoprim (BACTRIM DS) 800-160 MG per tablet 1 tablet (1 tablet Oral Given 05/18/21 2204)  ? ? ?ED Course/ Medical Decision Making/ A&P ?  ?                        ?Medical Decision Making ?Amount and/or Complexity of Data Reviewed ?Labs: ordered. ?Radiology: ordered. ? ?Risk ?Prescription drug management. ? ?66 year old male with focal area of erythema to the left axilla area and abdomen, states he was bitten by some insects earlier today, has evidence of mild cellulitis with no fluctuant abscess.  Vital signs are stable, afebrile.   ? ?Patient also reporting diffuse abdominal pain nausea vomiting.  Patient with grimacing on exam with abdominal palpation.  Will order CBC CMP, lipase and order CT abdomen pelvis with contrast to evaluate for any acute abdominal findings.  Patient's vital signs are stable. Discussed patient with MD on Main side who will monitor lab and imaging results and treat appropriately.  ? ? ? ? ? ? ? ?Final Clinical Impression(s) / ED Diagnoses ?Final diagnoses:  ?Insect bite of left upper arm, initial encounter  ?Insect bite of abdomen, initial encounter  ?Cellulitis of left axilla  ?Generalized abdominal pain  ? ? ?Rx / DC Orders ?ED Discharge Orders   ? ?      Ordered  ?  sulfamethoxazole-trimethoprim (BACTRIM DS) 800-160 MG tablet  2 times daily       ? 05/18/21 2200  ? ?  ?  ? ?  ? ? ?  ?Evon Slack, PA-C ?05/18/21 2243 ? ?  ?Evon Slack, PA-C ?05/18/21 2320 ? ?  ?Evon Slack, PA-C ?05/18/21 2323 ? ?  ?Evon Slack, PA-C ?05/18/21 2336 ? ?  ?Ward, Layla Maw, DO ?05/19/21 6063 ? ?

## 2021-05-18 NOTE — ED Provider Notes (Signed)
11:30 PM  Assumed care at shift change.  Patient complaining of diffuse abdominal pain.  History of alcohol induced pancreatitis.  Labs, urine, CT of the abdomen pelvis pending. ? ?1:30 AM  Pt's labs show no leukocytosis.  Normal creatinine.  AST and alkaline phosphatase elevated.  Also minimal elevation of total bilirubin.  Lipase is 99.  Ethanol level 254.  CT scan of the abdomen pelvis reviewed by myself and radiologist and shows diverticulitis versus short segment colitis, acute on chronic pancreatitis, gastroenteritis.  Patient continues to complain of pain and nausea.  We will continue pain and nausea medications by IV and give IV fluids.  We will give Zosyn for coverage for intra-abdominal infection.  He is requesting admission.  Will discuss with hospitalist. ? ?1:48 AM  Consulted and discussed patient's case with hospitalist, Dr. Sidney Ace.  I have recommended admission and consulting physician agrees and will place admission orders.  Patient (and family if present) agree with this plan.  ? ?I reviewed all nursing notes, vitals, pertinent previous records.  All labs, EKGs, imaging ordered have been independently reviewed and interpreted by myself. ? ?  ?Londa Mackowski, Delice Bison, DO ?05/19/21 0148 ? ?

## 2021-05-19 ENCOUNTER — Emergency Department: Payer: Medicare Other

## 2021-05-19 DIAGNOSIS — R1084 Generalized abdominal pain: Secondary | ICD-10-CM | POA: Diagnosis not present

## 2021-05-19 DIAGNOSIS — L03112 Cellulitis of left axilla: Secondary | ICD-10-CM | POA: Diagnosis present

## 2021-05-19 DIAGNOSIS — F17210 Nicotine dependence, cigarettes, uncomplicated: Secondary | ICD-10-CM | POA: Diagnosis present

## 2021-05-19 DIAGNOSIS — S40862A Insect bite (nonvenomous) of left upper arm, initial encounter: Secondary | ICD-10-CM | POA: Diagnosis present

## 2021-05-19 DIAGNOSIS — W57XXXA Bitten or stung by nonvenomous insect and other nonvenomous arthropods, initial encounter: Secondary | ICD-10-CM | POA: Diagnosis present

## 2021-05-19 DIAGNOSIS — K7010 Alcoholic hepatitis without ascites: Secondary | ICD-10-CM | POA: Diagnosis present

## 2021-05-19 DIAGNOSIS — I10 Essential (primary) hypertension: Secondary | ICD-10-CM

## 2021-05-19 DIAGNOSIS — K8020 Calculus of gallbladder without cholecystitis without obstruction: Secondary | ICD-10-CM | POA: Diagnosis present

## 2021-05-19 DIAGNOSIS — R1033 Periumbilical pain: Secondary | ICD-10-CM | POA: Diagnosis not present

## 2021-05-19 DIAGNOSIS — R7989 Other specified abnormal findings of blood chemistry: Secondary | ICD-10-CM | POA: Diagnosis not present

## 2021-05-19 DIAGNOSIS — S30861A Insect bite (nonvenomous) of abdominal wall, initial encounter: Secondary | ICD-10-CM | POA: Diagnosis present

## 2021-05-19 DIAGNOSIS — Z961 Presence of intraocular lens: Secondary | ICD-10-CM | POA: Diagnosis present

## 2021-05-19 DIAGNOSIS — Y908 Blood alcohol level of 240 mg/100 ml or more: Secondary | ICD-10-CM | POA: Diagnosis present

## 2021-05-19 DIAGNOSIS — K852 Alcohol induced acute pancreatitis without necrosis or infection: Secondary | ICD-10-CM | POA: Diagnosis not present

## 2021-05-19 DIAGNOSIS — K859 Acute pancreatitis without necrosis or infection, unspecified: Secondary | ICD-10-CM

## 2021-05-19 DIAGNOSIS — R160 Hepatomegaly, not elsewhere classified: Secondary | ICD-10-CM | POA: Diagnosis present

## 2021-05-19 DIAGNOSIS — F10231 Alcohol dependence with withdrawal delirium: Secondary | ICD-10-CM | POA: Diagnosis not present

## 2021-05-19 DIAGNOSIS — D649 Anemia, unspecified: Secondary | ICD-10-CM | POA: Diagnosis present

## 2021-05-19 DIAGNOSIS — N400 Enlarged prostate without lower urinary tract symptoms: Secondary | ICD-10-CM | POA: Diagnosis present

## 2021-05-19 DIAGNOSIS — E876 Hypokalemia: Secondary | ICD-10-CM | POA: Diagnosis not present

## 2021-05-19 DIAGNOSIS — K861 Other chronic pancreatitis: Secondary | ICD-10-CM | POA: Diagnosis present

## 2021-05-19 DIAGNOSIS — K760 Fatty (change of) liver, not elsewhere classified: Secondary | ICD-10-CM | POA: Diagnosis present

## 2021-05-19 DIAGNOSIS — K863 Pseudocyst of pancreas: Secondary | ICD-10-CM | POA: Diagnosis present

## 2021-05-19 DIAGNOSIS — K8681 Exocrine pancreatic insufficiency: Secondary | ICD-10-CM | POA: Diagnosis present

## 2021-05-19 DIAGNOSIS — F10221 Alcohol dependence with intoxication delirium: Secondary | ICD-10-CM | POA: Diagnosis present

## 2021-05-19 DIAGNOSIS — K529 Noninfective gastroenteritis and colitis, unspecified: Secondary | ICD-10-CM | POA: Diagnosis present

## 2021-05-19 DIAGNOSIS — K5732 Diverticulitis of large intestine without perforation or abscess without bleeding: Secondary | ICD-10-CM

## 2021-05-19 DIAGNOSIS — I2510 Atherosclerotic heart disease of native coronary artery without angina pectoris: Secondary | ICD-10-CM | POA: Diagnosis present

## 2021-05-19 DIAGNOSIS — L03119 Cellulitis of unspecified part of limb: Secondary | ICD-10-CM

## 2021-05-19 DIAGNOSIS — R197 Diarrhea, unspecified: Secondary | ICD-10-CM | POA: Diagnosis not present

## 2021-05-19 DIAGNOSIS — K5900 Constipation, unspecified: Secondary | ICD-10-CM | POA: Diagnosis present

## 2021-05-19 DIAGNOSIS — F10931 Alcohol use, unspecified with withdrawal delirium: Secondary | ICD-10-CM | POA: Diagnosis not present

## 2021-05-19 DIAGNOSIS — Z8616 Personal history of COVID-19: Secondary | ICD-10-CM | POA: Diagnosis not present

## 2021-05-19 LAB — BASIC METABOLIC PANEL
Anion gap: 12 (ref 5–15)
BUN: <5 mg/dL — ABNORMAL LOW (ref 8–23)
CO2: 21 mmol/L — ABNORMAL LOW (ref 22–32)
Calcium: 7.9 mg/dL — ABNORMAL LOW (ref 8.9–10.3)
Chloride: 107 mmol/L (ref 98–111)
Creatinine, Ser: 0.6 mg/dL — ABNORMAL LOW (ref 0.61–1.24)
GFR, Estimated: >60 mL/min (ref >60)
Glucose, Bld: 103 mg/dL — ABNORMAL HIGH (ref 70–99)
Potassium: 3.4 mmol/L — ABNORMAL LOW (ref 3.5–5.1)
Sodium: 140 mmol/L (ref 135–145)

## 2021-05-19 LAB — CBC
HCT: 34 % — ABNORMAL LOW (ref 39.0–52.0)
HCT: 34.1 % — ABNORMAL LOW (ref 39.0–52.0)
Hemoglobin: 11.2 g/dL — ABNORMAL LOW (ref 13.0–17.0)
Hemoglobin: 11.3 g/dL — ABNORMAL LOW (ref 13.0–17.0)
MCH: 30.1 pg (ref 26.0–34.0)
MCH: 30 pg (ref 26.0–34.0)
MCHC: 32.9 g/dL (ref 30.0–36.0)
MCHC: 33.1 g/dL (ref 30.0–36.0)
MCV: 91.4 fL (ref 80.0–100.0)
MCV: 90.5 fL (ref 80.0–100.0)
Platelets: 92 10*3/uL — ABNORMAL LOW (ref 150–400)
Platelets: 95 10*3/uL — ABNORMAL LOW (ref 150–400)
RBC: 3.72 MIL/uL — ABNORMAL LOW (ref 4.22–5.81)
RBC: 3.77 MIL/uL — ABNORMAL LOW (ref 4.22–5.81)
RDW: 17.4 % — ABNORMAL HIGH (ref 11.5–15.5)
RDW: 17.2 % — ABNORMAL HIGH (ref 11.5–15.5)
WBC: 6.1 10*3/uL (ref 4.0–10.5)
WBC: 6.8 10*3/uL (ref 4.0–10.5)
nRBC: 0 % (ref 0.0–0.2)
nRBC: 0 % (ref 0.0–0.2)

## 2021-05-19 LAB — URINALYSIS, ROUTINE W REFLEX MICROSCOPIC
Bilirubin Urine: NEGATIVE
Glucose, UA: NEGATIVE mg/dL
Hgb urine dipstick: NEGATIVE
Ketones, ur: NEGATIVE mg/dL
Leukocytes,Ua: NEGATIVE
Nitrite: NEGATIVE
Protein, ur: NEGATIVE mg/dL
Specific Gravity, Urine: 1.015 (ref 1.005–1.030)
pH: 6 (ref 5.0–8.0)

## 2021-05-19 LAB — ETHANOL: Alcohol, Ethyl (B): 254 mg/dL — ABNORMAL HIGH (ref <10)

## 2021-05-19 LAB — HIV ANTIBODY (ROUTINE TESTING W REFLEX): HIV Screen 4th Generation wRfx: NONREACTIVE

## 2021-05-19 MED ORDER — ONDANSETRON HCL 4 MG/2ML IJ SOLN: 4.0000 mg | Freq: Four times a day (QID) | INTRAMUSCULAR | Status: DC | PRN

## 2021-05-19 MED ORDER — METOPROLOL TARTRATE 25 MG PO TABS
12.5000 mg | ORAL_TABLET | Freq: Two times a day (BID) | ORAL | Status: DC
  Administered 2021-05-19 – 2021-05-25 (×13): 12.5 mg via ORAL
  Filled 2021-05-19 (×13): qty 1

## 2021-05-19 MED ORDER — MAGNESIUM HYDROXIDE 400 MG/5ML PO SUSP: 30.0000 mL | Freq: Every day | ORAL | Status: DC | PRN

## 2021-05-19 MED ORDER — ACETAMINOPHEN 650 MG RE SUPP: 650.0000 mg | Freq: Four times a day (QID) | RECTAL | Status: DC | PRN

## 2021-05-19 MED ORDER — SODIUM CHLORIDE 0.9 % IV SOLN: INTRAVENOUS | Status: DC

## 2021-05-19 MED ORDER — ONDANSETRON HCL 4 MG PO TABS: 4.0000 mg | ORAL_TABLET | Freq: Three times a day (TID) | ORAL | Status: DC | PRN

## 2021-05-19 MED ORDER — PIPERACILLIN-TAZOBACTAM 3.375 G IVPB 30 MIN
3.3750 g | Freq: Once | INTRAVENOUS | Status: AC
Start: 2021-05-19 — End: 2021-05-19
  Administered 2021-05-19: 3.375 g via INTRAVENOUS
  Filled 2021-05-19: qty 50

## 2021-05-19 MED ORDER — SODIUM CHLORIDE 0.9 % IV SOLN
1.0000 g | INTRAVENOUS | Status: DC
  Administered 2021-05-19 – 2021-05-22 (×4): 1 g via INTRAVENOUS
  Filled 2021-05-19 (×4): qty 10

## 2021-05-19 MED ORDER — LORAZEPAM 2 MG/ML IJ SOLN: 1.0000 mg | INTRAMUSCULAR | Status: DC | PRN

## 2021-05-19 MED ORDER — ACETAMINOPHEN 325 MG PO TABS
650.0000 mg | ORAL_TABLET | Freq: Four times a day (QID) | ORAL | Status: DC | PRN
  Administered 2021-05-19 – 2021-05-25 (×5): 650 mg via ORAL
  Filled 2021-05-19 (×5): qty 2

## 2021-05-19 MED ORDER — POTASSIUM CHLORIDE CRYS ER 10 MEQ PO TBCR
10.0000 meq | EXTENDED_RELEASE_TABLET | Freq: Every day | ORAL | Status: DC
  Administered 2021-05-19: 10 meq via ORAL
  Filled 2021-05-19: qty 1

## 2021-05-19 MED ORDER — THIAMINE HCL 100 MG PO TABS
100.0000 mg | ORAL_TABLET | Freq: Every day | ORAL | Status: DC
Start: 2021-05-19 — End: 2021-05-25
  Administered 2021-05-19 – 2021-05-25 (×7): 100 mg via ORAL
  Filled 2021-05-19 (×7): qty 1

## 2021-05-19 MED ORDER — ONDANSETRON HCL 4 MG/2ML IJ SOLN
4.0000 mg | Freq: Once | INTRAMUSCULAR | Status: AC
  Administered 2021-05-19: 4 mg via INTRAVENOUS
  Filled 2021-05-19: qty 2

## 2021-05-19 MED ORDER — ENOXAPARIN SODIUM 40 MG/0.4ML IJ SOSY
40.0000 mg | PREFILLED_SYRINGE | INTRAMUSCULAR | Status: DC
  Administered 2021-05-20 – 2021-05-25 (×6): 40 mg via SUBCUTANEOUS
  Filled 2021-05-19 (×7): qty 0.4

## 2021-05-19 MED ORDER — IOHEXOL 300 MG/ML  SOLN
100.0000 mL | Freq: Once | INTRAMUSCULAR | Status: AC | PRN
  Administered 2021-05-19: 100 mL via INTRAVENOUS
  Filled 2021-05-19: qty 100

## 2021-05-19 MED ORDER — FENTANYL CITRATE PF 50 MCG/ML IJ SOSY
50.0000 ug | PREFILLED_SYRINGE | Freq: Once | INTRAMUSCULAR | Status: AC
  Administered 2021-05-19: 50 ug via INTRAVENOUS
  Filled 2021-05-19: qty 1

## 2021-05-19 MED ORDER — METRONIDAZOLE 500 MG/100ML IV SOLN
500.0000 mg | Freq: Three times a day (TID) | INTRAVENOUS | Status: DC
Start: 2021-05-19 — End: 2021-05-22
  Administered 2021-05-19 – 2021-05-22 (×10): 500 mg via INTRAVENOUS
  Filled 2021-05-19 (×11): qty 100

## 2021-05-19 MED ORDER — PANTOPRAZOLE SODIUM 40 MG PO TBEC
40.0000 mg | DELAYED_RELEASE_TABLET | Freq: Every day | ORAL | Status: DC
  Administered 2021-05-19 – 2021-05-25 (×7): 40 mg via ORAL
  Filled 2021-05-19 (×7): qty 1

## 2021-05-19 MED ORDER — AMLODIPINE BESYLATE 5 MG PO TABS
5.0000 mg | ORAL_TABLET | Freq: Every day | ORAL | Status: DC
  Administered 2021-05-19 – 2021-05-25 (×7): 5 mg via ORAL
  Filled 2021-05-19 (×7): qty 1

## 2021-05-19 MED ORDER — SODIUM CHLORIDE 0.9 % IV BOLUS (SEPSIS)
1000.0000 mL | Freq: Once | INTRAVENOUS | Status: AC
Start: 2021-05-19 — End: 2021-05-19
  Administered 2021-05-19: 1000 mL via INTRAVENOUS

## 2021-05-19 MED ORDER — GABAPENTIN 300 MG PO CAPS
300.0000 mg | ORAL_CAPSULE | Freq: Three times a day (TID) | ORAL | Status: DC
  Administered 2021-05-19 – 2021-05-25 (×19): 300 mg via ORAL
  Filled 2021-05-19 (×19): qty 1

## 2021-05-19 MED ORDER — THIAMINE HCL 100 MG/ML IJ SOLN
Freq: Once | INTRAMUSCULAR | Status: DC
Start: 2021-05-19 — End: 2021-05-19

## 2021-05-19 MED ORDER — ADULT MULTIVITAMIN W/MINERALS CH
1.0000 | ORAL_TABLET | Freq: Every day | ORAL | Status: DC
  Administered 2021-05-19 – 2021-05-25 (×7): 1 via ORAL
  Filled 2021-05-19 (×7): qty 1

## 2021-05-19 MED ORDER — TRAZODONE HCL 50 MG PO TABS
25.0000 mg | ORAL_TABLET | Freq: Every evening | ORAL | Status: DC | PRN
  Administered 2021-05-20 – 2021-05-24 (×3): 25 mg via ORAL
  Filled 2021-05-19 (×4): qty 1

## 2021-05-19 MED ORDER — MORPHINE SULFATE (PF) 2 MG/ML IV SOLN
2.0000 mg | INTRAVENOUS | Status: DC | PRN
  Administered 2021-05-19 – 2021-05-20 (×6): 2 mg via INTRAVENOUS
  Filled 2021-05-19 (×6): qty 1

## 2021-05-19 MED ORDER — LORAZEPAM 1 MG PO TABS: 1.0000 mg | ORAL_TABLET | ORAL | Status: DC | PRN

## 2021-05-19 MED ORDER — FOLIC ACID 1 MG PO TABS
1.0000 mg | ORAL_TABLET | Freq: Every day | ORAL | Status: DC
Start: 2021-05-19 — End: 2021-05-25
  Administered 2021-05-19 – 2021-05-25 (×7): 1 mg via ORAL
  Filled 2021-05-19 (×7): qty 1

## 2021-05-19 MED ORDER — ONDANSETRON HCL 4 MG PO TABS
4.0000 mg | ORAL_TABLET | Freq: Four times a day (QID) | ORAL | Status: DC | PRN
  Administered 2021-05-19: 4 mg via ORAL
  Filled 2021-05-19: qty 1

## 2021-05-19 MED ORDER — LORAZEPAM 2 MG/ML IJ SOLN
1.0000 mg | INTRAMUSCULAR | Status: DC | PRN
  Administered 2021-05-20 – 2021-05-21 (×4): 2 mg via INTRAVENOUS
  Filled 2021-05-19 (×4): qty 1

## 2021-05-19 NOTE — Assessment & Plan Note (Addendum)
-   This is associated with alcoholic intoxication. ?-will need to stop drinking ?-CIWA ?-resolved ?

## 2021-05-19 NOTE — H&P (Addendum)
?  ?  ?[facility] ? ? ?PATIENT NAME: Thomas Coffey   ? ?MR#:  [id number] ? ?DATE OF BIRTH:   29, 1957 ? ?DATE OF ADMISSION:  05/18/2021 ? ?PRIMARY CARE PHYSICIAN: [facility]  ? ?Patient is coming from: Home ? ?REQUESTING/REFERRING PHYSICIAN: Ward, Delice Bison, DO ? ?CHIEF COMPLAINT:  ? ?Chief Complaint  ?Patient presents with  ? Insect Bite  ? ? ?HISTORY OF PRESENT ILLNESS:  ?Thomas Coffey is a 66 y.o. male with medical history significant for alcoholic pancreatitis, coronary artery disease status post MI, paroxysmal atrial fibrillation, and COVID-19, who presented to the ER with acute onset of periumbilical abdominal pain that started about a week ago with associated nausea and vomiting as well as constipation.  He denies any fever or chills.  No diarrhea or melena or bright red bleeding per rectum.  No hematemesis or jaundice.  No dysuria, oliguria or hematuria or flank pain.  He was having left axillary rash with mild tenderness and pain. ? ?ED Course: Upon presentation to the emergency room vital signs were within normal.  Labs revealed an alk phos of 350 and albumin of 2.7 lipase 99 AST 161 and total bili 1.4.  CBC revealed mild anemia.  Alcohol level was 254. ? ?Imaging: Abdominal pelvic CT scan revealed ? ?1. There are scattered sigmoid diverticula and a chronically ?thickened sigmoid colon and upper rectum, but today there are ?inflammatory changes adjacent the proximal sigmoid consistent with ?diverticulitis or short-segment colitis. Infiltrating disease not ?strictly excluded. Colonoscopy follow-up recommended after ?treatment. ?2. Calcifications of chronic pancreatitis, chronic pancreatic ductal ?prominence, and faint stranding along the pancreatic head and neck ?which could be due to recurrent pancreatitis or fat scarring from ?old pancreatitis. Correlate with lipase/amylase serum levels. ?3. 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 ?4. Imaging  findings again noted consistent with gastroenteritis, ?with disproportionately thickened duodenum. Correlate with most ?recent endoscopic results or obtain endoscopic follow-up. ?5. Heterogeneous hepatic steatosis, mild hepatomegaly. ?6. Uncomplicated cholelithiasis. ?7. Trace ascites in the paracolic gutters which was seen previously ?as well as minimal perinephric fluid bilaterally. No pelvic ascites ?is seen. No pneumatosis or free air. ?8. Prior right hemicolectomy. ?9. Large prostate.  Remaining findings discussed above. ? ?The patient was given 50 mcg of IV fentanyl, 1 p.o. Norco, 4 mg of IV Zofran as well as IV Zosyn and Bactrim DS.  He will be admitted to a medical bed for further evaluation and management. ?PAST MEDICAL HISTORY:  ? ?Past Medical History:  ?Diagnosis Date  ? Acute pancreatitis   ? COVID-19 virus infection   ? positive COVID-19 test on 01/22/20  ? MI (myocardial infarction) ([place])   ? Paroxysmal atrial fibrillation ([facility])   ? ? ?PAST SURGICAL HISTORY:  ? ?Past Surgical History:  ?Procedure Laterality Date  ? ANTERIOR VITRECTOMY Right 10/04/2020  ? Procedure: ANTERIOR VITRECTOMY;  Surgeon: Eulogio Bear, MD;  Location: [place];  Service: Ophthalmology;  Laterality: Right;  ? CATARACT EXTRACTION W/PHACO Right 10/04/2020  ? Procedure: CATARACT EXTRACTION PHACO AND INTRAOCULAR LENS PLACEMENT (IOC) RIGHT VISION BLUE, CAPSULAR TENSION RING 14A  RIGHT 21.84 01:44.7;  Surgeon: Eulogio Bear, MD;  Location: [place];  Service: Ophthalmology;  Laterality: Right;  ? CATARACT EXTRACTION W/PHACO Left 10/18/2020  ? Procedure: CATARACT EXTRACTION PHACO AND INTRAOCULAR LENS PLACEMENT (IOC) LEFT 2.95 00:23.9;  Surgeon: Eulogio Bear, MD;  Location: [place];  Service: Ophthalmology;  Laterality: Left;  ? PARTIAL COLECTOMY    ? ? ?  SOCIAL HISTORY:  ? ?Social History  ? ?Tobacco Use  ? Smoking status: Every Day  ?  Packs/day: 1.00  ?  Years: 30.00  ?  Pack years: 30.00   ?  Types: Cigarettes  ? Smokeless tobacco: Never  ?Substance Use Topics  ? Alcohol use: Yes  ?  Alcohol/week: 6.0 standard drinks  ?  Types: 6 Cans of beer per week  ?  Comment: daily  ? ? ?FAMILY HISTORY:  ? ?Family History  ?Problem Relation Age of Onset  ? Cancer Mother   ? Kidney disease Brother   ? ? ?DRUG ALLERGIES:  ?No Known Allergies ? ?REVIEW OF SYSTEMS:  ? ?ROS ?As per history of present illness. All pertinent systems were reviewed above. Constitutional, HEENT, cardiovascular, respiratory, GI, GU, musculoskeletal, neuro, psychiatric, endocrine, integumentary and hematologic systems were reviewed and are otherwise negative/unremarkable except for positive findings mentioned above in the HPI. ? ? ?MEDICATIONS AT HOME:  ? ?Prior to Admission medications   ?Medication Sig Start Date End Date Taking? Authorizing Provider  ?sulfamethoxazole-trimethoprim (BACTRIM DS) 800-160 MG tablet Take 1 tablet by mouth 2 (two) times daily. 05/18/21  Yes Duanne Guess, PA-C  ?amLODipine (NORVASC) 5 MG tablet Take 1 tablet (5 mg total) by mouth daily. 12/31/20   Ezekiel Slocumb, DO  ?folic acid (FOLVITE) 1 MG tablet Take 1 tablet (1 mg total) by mouth daily. 12/31/20   Ezekiel Slocumb, DO  ?gabapentin (NEURONTIN) 300 MG capsule Take 1 capsule (300 mg total) by mouth 3 (three) times daily. 12/30/20   Ezekiel Slocumb, DO  ?metoprolol tartrate (LOPRESSOR) 25 MG tablet Take 0.5 tablets (12.5 mg total) by mouth 2 (two) times daily. 12/30/20   Ezekiel Slocumb, DO  ?Multiple Vitamin (MULTIVITAMIN WITH MINERALS) TABS tablet Take 1 tablet by mouth daily. 12/30/20   Ezekiel Slocumb, DO  ?ondansetron (ZOFRAN) 4 MG tablet Take 1 tablet (4 mg total) by mouth every 8 (eight) hours as needed for nausea or vomiting. 12/30/20   Nicole Kindred A, DO  ?pantoprazole (PROTONIX) 40 MG tablet Take 1 tablet (40 mg total) by mouth daily. 12/31/20   Ezekiel Slocumb, DO  ?potassium chloride (KLOR-CON M) 20 MEQ tablet Take 0.5 tablets (10  mEq total) by mouth daily. 12/30/20   Nicole Kindred A, DO  ?thiamine 100 MG tablet Take 1 tablet (100 mg total) by mouth daily. 12/31/20   Ezekiel Slocumb, DO  ? ?  ? ?VITAL SIGNS:  ?Blood pressure 117/87, pulse 70, temperature 97.9 ?F (36.6 ?C), temperature source Oral, resp. rate 18, height 5' 9"  (1.753 m), weight 70.3 kg, SpO2 96 %. ? ?PHYSICAL EXAMINATION:  ?Physical Exam ? ?GENERAL:  66 y.o.-year-old patient lying in the bed with no acute distress.  ?EYES: Pupils equal, round, reactive to light and accommodation. No scleral icterus. Extraocular muscles intact.  ?HEENT: Head atraumatic, normocephalic. Oropharynx and nasopharynx clear.  ?NECK:  Supple, no jugular venous distention. No thyroid enlargement, no tenderness.  ?LUNGS: Normal breath sounds bilaterally, no wheezing, rales,rhonchi or crepitation. No use of accessory muscles of respiration.  ?CARDIOVASCULAR: Regular rate and rhythm, S1, S2 normal. No murmurs, rubs, or gallops.  ?ABDOMEN: Soft, nondistended, with generalized lower abdominal tenderness without rebound tenderness guarding or rigidity.  Bowel sounds present. No organomegaly or mass.  ?EXTREMITIES: No pedal edema, cyanosis, or clubbing.  ?NEUROLOGIC: Cranial nerves II through XII are intact. Muscle strength 5/5 in all extremities. Sensation intact. Gait not checked.  ?PSYCHIATRIC: The patient is alert  and oriented x 3.  Normal affect and good eye contact. ?SKIN: Left axilla with 2 x 2 cm macular erythematous rash, minimal induration with no fluctuance, no active drainage ? ?LABORATORY PANEL:  ? ?CBC ?Recent Labs  ?Lab 05/18/21 ?2320  ?WBC 6.1  ?HGB 11.2*  ?HCT 34.0*  ?PLT 92*  ? ?------------------------------------------------------------------------------------------------------------------ ? ?Chemistries  ?Recent Labs  ?Lab 05/18/21 ?2320  ?NA 140  ?K 4.0  ?CL 107  ?CO2 22  ?GLUCOSE 91  ?BUN <5*  ?CREATININE 0.56*  ?CALCIUM 8.2*  ?AST 161*  ?ALT 24  ?ALKPHOS 315*  ?BILITOT 1.4*   ? ?------------------------------------------------------------------------------------------------------------------ ? ?Cardiac Enzymes ?No results for input(s): TROPONINI in the last 168 hours. ?-----------------

## 2021-05-19 NOTE — Assessment & Plan Note (Addendum)
-  covered with IV Rocephin. ?-appears improved ?- Warm compresses can be applied. ?

## 2021-05-19 NOTE — Assessment & Plan Note (Addendum)
-   could be related to mild alcoholic hepatitis. ?- trend down ?

## 2021-05-19 NOTE — Progress Notes (Signed)
The patient is a 66 yr old man who presented to [facility] ED with complaint of acute onset of periumbilical abdominal pain that started a week ago. He has had nausea and vomiting as well he states that he is constipated and has not had a BM for 3-4 days.  ? ?He carries a past medical history of alcohol abuse, alcoholic pancreatitis, CAD/MI, PAF, and COVID-19. ? ?In the ED he was found to have an alcohol level of 254, alk phos of 350 and albumin of 2.7 lipase 99 AST 161 and total bili 1.4.  CBC revealed mild anemia. ? ?CT abdomen and pelvis demonstrated chronically thickened sigmoid colon and upper rectum with adjacent inflammatory changes consistent with diverticulitis or short sigment colitis. Malignancy or other infiltrating disease is not excluded. Signs of chronic and recurrent pancreatitis were present with stranding along pancreatic head and neck. There is a 3.2 cm pancreatic pseudocyst that was previously measured at 3.3 cm. There were also findings consistent with gastroenteritis with a disproportionately thickened duodenum. There is also hepatic steatosis, mild hepatomegaly, uncomplicated cholelithiasis. There is a trace of ascites present There is an enlarged prostate and evidence of prior right hemicolectomy. ? ?The patient was admitted to a medical bed as inpatient. He received IV Zosyn and bactrim in the ED. This has been converted to ceftriaxone and flagyl. He is receiving IV fluids. Folic acid and thiamine. His home meds have been continued as possible. ? ?The patient's vitals and labwork have been reviewed.  ? ?He is awake and alert. No acute distress. ?Heart and lung exam is within normal limits. ?The abdomen is somewhat distended. Hypoactive bowel sounds. There is tenderness in the periumbilical area. ?Extremities are negative for cyanosis. Clubbing or edema.  ?

## 2021-05-19 NOTE — Assessment & Plan Note (Addendum)
-   This is secondary to acute sigmoid colitis and possibly diverticulitis as well as acute pancreatitis ?- changed pain meds to dilaudid with improvement --> discharged on limited po meds ?- IV Rocephin and Flagyl --> changed to PO augmentin ?-will need colonoscopy after treatment as an outpatient ?-advanced diet tolerated  ?

## 2021-05-19 NOTE — Plan of Care (Signed)
?  Problem: Education: ?Goal: Knowledge of General Education information will improve ?Description: Including pain rating scale, medication(s)/side effects and non-pharmacologic comfort measures ?Outcome: Progressing ?  ?Problem: Clinical Measurements: ?Goal: Ability to maintain clinical measurements within normal limits will improve ?Outcome: Progressing ?Goal: Respiratory complications will improve ?Outcome: Progressing ?Goal: Cardiovascular complication will be avoided ?Outcome: Progressing ?  ?Problem: Activity: ?Goal: Risk for activity intolerance will decrease ?Outcome: Progressing ?  ?Problem: Nutrition: ?Goal: Adequate nutrition will be maintained ?Outcome: Progressing ?  ?Problem: Coping: ?Goal: Level of anxiety will decrease ?Outcome: Progressing ?  ?Problem: Elimination: ?Goal: Will not experience complications related to urinary retention ?Outcome: Progressing ?  ?

## 2021-05-19 NOTE — Assessment & Plan Note (Addendum)
-   continue antihypertensives

## 2021-05-20 DIAGNOSIS — R1033 Periumbilical pain: Secondary | ICD-10-CM | POA: Diagnosis not present

## 2021-05-20 DIAGNOSIS — I10 Essential (primary) hypertension: Secondary | ICD-10-CM

## 2021-05-20 DIAGNOSIS — R7989 Other specified abnormal findings of blood chemistry: Secondary | ICD-10-CM

## 2021-05-20 DIAGNOSIS — K859 Acute pancreatitis without necrosis or infection, unspecified: Secondary | ICD-10-CM | POA: Diagnosis not present

## 2021-05-20 LAB — BASIC METABOLIC PANEL
Anion gap: 8 (ref 5–15)
BUN: <5 mg/dL — ABNORMAL LOW (ref 8–23)
CO2: 23 mmol/L (ref 22–32)
Calcium: 7.8 mg/dL — ABNORMAL LOW (ref 8.9–10.3)
Chloride: 107 mmol/L (ref 98–111)
Creatinine, Ser: 0.6 mg/dL — ABNORMAL LOW (ref 0.61–1.24)
GFR, Estimated: >60 mL/min (ref >60)
Glucose, Bld: 82 mg/dL (ref 70–99)
Potassium: 3.3 mmol/L — ABNORMAL LOW (ref 3.5–5.1)
Sodium: 138 mmol/L (ref 135–145)

## 2021-05-20 MED ORDER — HYDROMORPHONE HCL 1 MG/ML IJ SOLN
0.5000 mg | INTRAMUSCULAR | Status: DC | PRN
  Administered 2021-05-20 – 2021-05-25 (×14): 0.5 mg via INTRAVENOUS
  Filled 2021-05-20 (×15): qty 0.5

## 2021-05-20 MED ORDER — HYDROCORTISONE 1 % EX CREA
TOPICAL_CREAM | CUTANEOUS | Status: DC | PRN
  Administered 2021-05-20: 1 via TOPICAL
  Filled 2021-05-20 (×2): qty 28

## 2021-05-20 MED ORDER — NICOTINE 14 MG/24HR TD PT24
14.0000 mg | MEDICATED_PATCH | Freq: Every day | TRANSDERMAL | Status: DC
  Administered 2021-05-20 – 2021-05-25 (×6): 14 mg via TRANSDERMAL
  Filled 2021-05-20 (×6): qty 1

## 2021-05-20 MED ORDER — POTASSIUM CHLORIDE CRYS ER 20 MEQ PO TBCR
40.0000 meq | EXTENDED_RELEASE_TABLET | Freq: Once | ORAL | Status: AC
Start: 2021-05-20 — End: 2021-05-20
  Administered 2021-05-20: 40 meq via ORAL
  Filled 2021-05-20: qty 2

## 2021-05-20 NOTE — Hospital Course (Addendum)
Thomas Coffey is a 66 y.o. male with medical history significant for alcoholic pancreatitis, coronary artery disease status post MI, paroxysmal atrial fibrillation, and COVID-19, who presented to the ER with acute onset of periumbilical abdominal pain that started about a week ago with associated nausea and vomiting.  Has colitis and pancreatitis.  Also began to withdraw with alcohol delirium- withdrawal resolved on 5/8.  Home in 24-48 hours. ?

## 2021-05-20 NOTE — Progress Notes (Signed)
?PROGRESS NOTE ? ? ? ?Thomas Coffey  HQP:[id number] DOB: 1955/05/14 DOA: 05/18/2021 ?PCP: [facility]  ? ? ?Brief Narrative:  ?Thomas Coffey is a 66 y.o. male with medical history significant for alcoholic pancreatitis, coronary artery disease status post MI, paroxysmal atrial fibrillation, and COVID-19, who presented to the ER with acute onset of periumbilical abdominal pain that started about a week ago with associated nausea and vomiting.  Has colitis and pancreatitis.  ? ? ?Assessment and Plan: ?* Abdominal pain ?- This is secondary to acute sigmoid colitis and possibly diverticulitis as well as acute pancreatitis ?-most pain is currently epigastric ?- changed pain meds to dilaudid ?-continue antibiotic therapy with IV Rocephin and Flagyl. ?-will need colonoscopy after treatment as an outpatient ? ?Abnormal LFTs ?- This could be related to mild alcoholic hepatitis. ?- trend ? ?Acute on chronic pancreatitis (HCC) ?- This is associated with alcoholic intoxication. ?- bowel rest, IVF and pain control ?-will need to stop drinking ?-CIWA ? ?Cellulitis of axillary region ?-covered with IV Rocephin. ?- Warm compresses can be applied. ? ?Essential hypertension ?-continue antihypertensives ? ? ? ? ? ? ? ? ? ?DVT prophylaxis: enoxaparin (LOVENOX) injection 40 mg Start: 05/19/21 0800 ? ?  Code Status: Full Code ? ? ?Disposition Plan:  ?Level of care: Med-Surg ?Status is: Inpatient ?Remains inpatient appropriate because: needs IV abx and treatment of pancreatitis ?  ? ?Consultants:  ?none ? ? ?Subjective: ?C/o epigastric pain ? ?Objective: ?Vitals:  ? 05/19/21 2027 05/20/21 0002 05/20/21 0356 05/20/21 0755  ?BP: (!) 144/84 127/81 133/81 127/80  ?Pulse: 74 81 91 80  ?Resp: 16 16 15 19   ?Temp: 98.9 ?F (37.2 ?C) 98 ?F (36.7 ?C) 98.9 ?F (37.2 ?C) 99 ?F (37.2 ?C)  ?TempSrc:      ?SpO2: 98% 94% 91% 94%  ?Weight:      ?Height:      ? ? ?Intake/Output Summary (Last 24 hours) at 05/20/2021 0949 ?Last data  filed at 05/20/2021 0800 ?Gross per 24 hour  ?Intake 2651.44 ml  ?Output 725 ml  ?Net 1926.44 ml  ? ?Filed Weights  ? 05/18/21 2053  ?Weight: 70.3 kg  ? ? ?Examination: ? ? ?General: Appearance:    Well developed, well nourished male in no acute distress  ?   ?Lungs:     respirations unlabored  ?Heart:    Normal heart rate.  ?  ?MS:   All extremities are intact.  ?  ?Neurologic:   Awake, alert, oriented x 3. No apparent focal neurological           defect.   ?  ? ? ? ?Data Reviewed: I have personally reviewed following labs and imaging studies ? ?CBC: ?Recent Labs  ?Lab 05/18/21 ?2320 05/19/21 ?1606  ?WBC 6.1 6.8  ?HGB 11.2* 11.3*  ?HCT 34.0* 34.1*  ?MCV 91.4 90.5  ?PLT 92* 95*  ? ?Basic Metabolic Panel: ?Recent Labs  ?Lab 05/18/21 ?2320 05/19/21 ?1606 05/20/21 ?07/20/21  ?NA 140 140 138  ?K 4.0 3.4* 3.3*  ?CL 107 107 107  ?CO2 22 21* 23  ?GLUCOSE 91 103* 82  ?BUN <5* <5* <5*  ?CREATININE 0.56* 0.60* 0.60*  ?CALCIUM 8.2* 7.9* 7.8*  ? ?GFR: ?Estimated Creatinine Clearance: 91.5 mL/min (A) (by C-G formula based on SCr of 0.6 mg/dL (L)). ?Liver Function Tests: ?Recent Labs  ?Lab 05/18/21 ?2320  ?AST 161*  ?ALT 24  ?ALKPHOS 315*  ?BILITOT 1.4*  ?PROT 6.8  ?ALBUMIN 2.7*  ? ?Recent Labs  ?  Lab 05/18/21 ?2320  ?LIPASE 99*  ? ?No results for input(s): AMMONIA in the last 168 hours. ?Coagulation Profile: ?No results for input(s): INR, PROTIME in the last 168 hours. ?Cardiac Enzymes: ?No results for input(s): CKTOTAL, CKMB, CKMBINDEX, TROPONINI in the last 168 hours. ?BNP (last 3 results) ?No results for input(s): PROBNP in the last 8760 hours. ?HbA1C: ?No results for input(s): HGBA1C in the last 72 hours. ?CBG: ?No results for input(s): GLUCAP in the last 168 hours. ?Lipid Profile: ?No results for input(s): CHOL, HDL, LDLCALC, TRIG, CHOLHDL, LDLDIRECT in the last 72 hours. ?Thyroid Function Tests: ?No results for input(s): TSH, T4TOTAL, FREET4, T3FREE, THYROIDAB in the last 72 hours. ?Anemia Panel: ?No results for input(s):  VITAMINB12, FOLATE, FERRITIN, TIBC, IRON, RETICCTPCT in the last 72 hours. ?Sepsis Labs: ?No results for input(s): PROCALCITON, LATICACIDVEN in the last 168 hours. ? ?No results found for this or any previous visit (from the past 240 hour(s)).  ? ? ? ? ? ?Radiology Studies: ?CT ABDOMEN PELVIS W CONTRAST ? ?Result Date: 05/19/2021 ?CLINICAL DATA:  Abdominal pain with history of recurrent pancreatitis, chronic pancreatitis. EXAM: CT ABDOMEN AND PELVIS WITH CONTRAST TECHNIQUE: Multidetector CT imaging of the abdomen and pelvis was performed using the standard protocol following bolus administration of intravenous contrast. RADIATION DOSE REDUCTION: This exam was performed according to the departmental dose-optimization program which includes automated exposure control, adjustment of the mA and/or kV according to patient size and/or use of iterative reconstruction technique. CONTRAST:  OMNIPAQUE IOHEXOL 300 MG/ML  SOLN COMPARISON:  CT with contrast MRI without and with contrast of 12/28/2020, CT without contrast 07/04/2020 FINDINGS: Lower chest: No acute abnormality. Coronary artery calcifications are again noted. Hepatobiliary: Heterogeneous low attenuation is noted consistent with prior MRI finding of heterogeneous hepatic steatosis. 20.5 cm in length with no mass enhancement. Gallbladder is mildly distended containing several stones but no wall thickening. No biliary dilatation. Pancreas: Multiple calcifications consistent with chronic calcific pancreatitis. There previously was a 3.3 cm pseudocyst in the pancreatic head which is decreased in size to 2 cm. There is mild stable dilatation of the pancreatic duct, disproportionate atrophy in the tail segment, and no mass enhancement. Faint stranding changes are noted adjacent the head and neck segments, and could be due to recurrent pancreatitis or fat scarring from the prior pancreatitis. Spleen: Normal in size with homogeneous enhancement allowing for respiratory  motion. Adrenals/Urinary Tract: No hydronephrosis or urinary stone disease. Small areas of scattered cortical scarring without mass enhancement in either kidney or adrenal gland. Bilateral perinephric stranding is similar with trace minimal perinephric fluid also unchanged. There is no bladder thickening despite mild impression by the prostate gland. Stomach/Bowel: Circumferentially moderately thickened duodenum is again shown. There are thickened folds in the stomach and jejunum as well. There has been a right hemicolectomy with patent surgical anastomosis in the subhepatic space. There are scattered diverticula. The proximal to mid sigmoid colon is again noted thickened but there is developed perisigmoid inflammatory stranding on series 2 axial images 68-70 over the proximal sigmoid consistent with either localized diverticulitis or colitis. Rest of the sigmoid and upper rectal wall is similarly thickened but does not show inflammatory stranding. Vascular/Lymphatic: Patchy aortoiliac atherosclerosis. No AAA. No appreciable adenopathy. Reproductive: Prominent prostate gland approaching 5 cm with bladder base impression, unchanged. Multiple pelvic phleboliths. Other: There is trace ascites in the pericolic gutters minimal chronic bilateral perinephric fluid. There are no pelvic ascites. There is no free air, hemorrhage or abscess. Musculoskeletal: There are degenerative  changes of the lower thoracic and lumbar spine. Moderate spinal canal stenosis noted at L3-4 due to a posteriorly projecting osteophyte of the posterior L3 inferior endplate. There is moderate to severe foraminal stenosis from L3-4 down, largely due to facet hypertrophy. No worrisome regional skeletal lesion. IMPRESSION: 1. There are scattered sigmoid diverticula and a chronically thickened sigmoid colon and upper rectum, but today there are inflammatory changes adjacent the proximal sigmoid consistent with diverticulitis or short-segment colitis.  Infiltrating disease not strictly excluded. Colonoscopy follow-up recommended after treatment. 2. Calcifications of chronic pancreatitis, chronic pancreatic ductal prominence, and faint stranding along the pancr

## 2021-05-21 DIAGNOSIS — R1033 Periumbilical pain: Secondary | ICD-10-CM | POA: Diagnosis not present

## 2021-05-21 DIAGNOSIS — F10931 Alcohol use, unspecified with withdrawal delirium: Secondary | ICD-10-CM | POA: Diagnosis not present

## 2021-05-21 DIAGNOSIS — I10 Essential (primary) hypertension: Secondary | ICD-10-CM | POA: Diagnosis not present

## 2021-05-21 DIAGNOSIS — K859 Acute pancreatitis without necrosis or infection, unspecified: Secondary | ICD-10-CM | POA: Diagnosis not present

## 2021-05-21 LAB — COMPREHENSIVE METABOLIC PANEL
ALT: 17 U/L (ref 0–44)
AST: 83 U/L — ABNORMAL HIGH (ref 15–41)
Albumin: 2.5 g/dL — ABNORMAL LOW (ref 3.5–5.0)
Alkaline Phosphatase: 237 U/L — ABNORMAL HIGH (ref 38–126)
Anion gap: 8 (ref 5–15)
BUN: <5 mg/dL — ABNORMAL LOW (ref 8–23)
CO2: 21 mmol/L — ABNORMAL LOW (ref 22–32)
Calcium: 7.9 mg/dL — ABNORMAL LOW (ref 8.9–10.3)
Chloride: 107 mmol/L (ref 98–111)
Creatinine, Ser: 0.56 mg/dL — ABNORMAL LOW (ref 0.61–1.24)
GFR, Estimated: >60 mL/min (ref >60)
Glucose, Bld: 123 mg/dL — ABNORMAL HIGH (ref 70–99)
Potassium: 3.9 mmol/L (ref 3.5–5.1)
Sodium: 136 mmol/L (ref 135–145)
Total Bilirubin: 2.1 mg/dL — ABNORMAL HIGH (ref 0.3–1.2)
Total Protein: 6.1 g/dL — ABNORMAL LOW (ref 6.5–8.1)

## 2021-05-21 LAB — CBC
HCT: 36.8 % — ABNORMAL LOW (ref 39.0–52.0)
Hemoglobin: 12 g/dL — ABNORMAL LOW (ref 13.0–17.0)
MCH: 30.3 pg (ref 26.0–34.0)
MCHC: 32.6 g/dL (ref 30.0–36.0)
MCV: 92.9 fL (ref 80.0–100.0)
Platelets: 90 10*3/uL — ABNORMAL LOW (ref 150–400)
RBC: 3.96 MIL/uL — ABNORMAL LOW (ref 4.22–5.81)
RDW: 17.6 % — ABNORMAL HIGH (ref 11.5–15.5)
WBC: 4.9 10*3/uL (ref 4.0–10.5)
nRBC: 0 % (ref 0.0–0.2)

## 2021-05-21 LAB — C DIFFICILE QUICK SCREEN W PCR REFLEX
C Diff antigen: NEGATIVE
C Diff interpretation: NOT DETECTED
C Diff toxin: NEGATIVE

## 2021-05-21 LAB — LIPASE, BLOOD: Lipase: 27 U/L (ref 11–51)

## 2021-05-21 MED ORDER — HALOPERIDOL LACTATE 5 MG/ML IJ SOLN
2.0000 mg | Freq: Four times a day (QID) | INTRAMUSCULAR | Status: DC | PRN
  Administered 2021-05-22 (×2): 2 mg via INTRAVENOUS
  Filled 2021-05-21 (×2): qty 1

## 2021-05-21 MED ORDER — PHENOBARBITAL SODIUM 130 MG/ML IJ SOLN
130.0000 mg | Freq: Once | INTRAMUSCULAR | Status: AC
  Administered 2021-05-21: 130 mg via INTRAVENOUS
  Filled 2021-05-21: qty 1

## 2021-05-21 MED ORDER — LORAZEPAM 2 MG/ML IJ SOLN
1.0000 mg | INTRAMUSCULAR | Status: DC | PRN
  Administered 2021-05-21: 2 mg via INTRAVENOUS
  Filled 2021-05-21: qty 1

## 2021-05-21 MED ORDER — LORAZEPAM 1 MG PO TABS
1.0000 mg | ORAL_TABLET | ORAL | Status: DC | PRN
  Administered 2021-05-21: 2 mg via ORAL
  Administered 2021-05-22: 1 mg via ORAL
  Filled 2021-05-21: qty 1
  Filled 2021-05-21: qty 2

## 2021-05-21 MED ORDER — LORAZEPAM 2 MG/ML IJ SOLN
0.0000 mg | Freq: Three times a day (TID) | INTRAMUSCULAR | Status: DC
  Filled 2021-05-21: qty 1

## 2021-05-21 MED ORDER — LORAZEPAM 2 MG/ML IJ SOLN
0.0000 mg | INTRAMUSCULAR | Status: AC
  Administered 2021-05-21 – 2021-05-22 (×4): 2 mg via INTRAVENOUS
  Filled 2021-05-21 (×4): qty 1

## 2021-05-21 MED ORDER — SODIUM CHLORIDE 0.9 % IV SOLN: INTRAVENOUS | Status: DC | PRN

## 2021-05-21 NOTE — TOC CM/SW Note (Signed)
CSW acknowledges consult for SA resources. ?Per chart review and rounds, patient is currently agitated and requiring a sitter. ?TOC to follow up when appropriate. ? Alfonso Ramus, LCSW ?[phone] ? ?

## 2021-05-21 NOTE — Progress Notes (Addendum)
Came back to evaluate patient.  He is currently not able to leave AMA as he does not have the capacity to make that decision.  He is confused and unable to understand the risk of leaving.  PRN restraints ordered.  Sitter ordered.   ?-on CIWA- scheduled and PRN ?-responded earlier to phenobarb-- so will try another dose ?

## 2021-05-21 NOTE — Progress Notes (Signed)
?PROGRESS NOTE ? ? ? ?GRACIE RELAFORD  [id number] DOB: 1955/04/21 DOA: 05/18/2021 ?PCP: [facility]  ? ? ?Brief Narrative:  ?YOBANY VALENZANO is a 66 y.o. male with medical history significant for alcoholic pancreatitis, coronary artery disease status post MI, paroxysmal atrial fibrillation, and COVID-19, who presented to the ER with acute onset of periumbilical abdominal pain that started about a week ago with associated nausea and vomiting.  Has colitis and pancreatitis.  ? ? ?Assessment and Plan: ?* Abdominal pain ?- This is secondary to acute sigmoid colitis and possibly diverticulitis as well as acute pancreatitis ?-most pain is currently epigastric ?- changed pain meds to dilaudid with improvement ?-continue antibiotic therapy with IV Rocephin and Flagyl. ?-will need colonoscopy after treatment as an outpatient ? ?Abnormal LFTs ?- could be related to mild alcoholic hepatitis. ?- trend ? ?Acute on chronic pancreatitis ([place]) ?- This is associated with alcoholic intoxication. ?- bowel rest, IVF and pain control ?-will need to stop drinking ?-CIWA ? ?Cellulitis of axillary region ?-covered with IV Rocephin. ?-appears improved ?- Warm compresses can be applied. ? 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 ? ?Essential hypertension ?-continue antihypertensives ? ? ? ? ? ? ? ? ? ?DVT prophylaxis: enoxaparin (LOVENOX) injection 40 mg Start: 05/19/21 0800 ? ?  Code Status: Full Code ? ? ?Disposition Plan:  ?Level of care: Med-Surg ?Status is: Inpatient ?Remains inpatient appropriate because: going through withdrawal ?  ? ?Consultants:  ?none ? ? ?Subjective: ?Became combative overnight ? ?Objective: ?Vitals:  ? 05/21/21 0312 05/21/21 0840 05/21/21 0900 05/21/21 1200  ?BP: 125/81 (!) 150/88 131/83 127/77  ?Pulse: 77 80 80 77  ?Resp: 16 16    ?Temp: 98.1 ?F (36.7 ?C) 97.9 ?F (36.6 ?C)    ?TempSrc:  Oral Oral    ?SpO2: 100% 96%    ?Weight: 66.8 kg     ?Height:      ? ? ?Intake/Output Summary (Last 24 hours) at 05/21/2021 1338 ?Last data filed at 05/21/2021 1028 ?Gross per 24 hour  ?Intake 2508.58 ml  ?Output 525 ml  ?Net 1983.58 ml  ? ?Filed Weights  ? 05/18/21 2053 05/21/21 0312  ?Weight: 70.3 kg 66.8 kg  ? ? ?Examination: ? ? ?General: Appearance:    Chronically ill appearing male in no acute distress  ?   ?Lungs:     respirations unlabored  ?Heart:    Normal heart rate.  ?  ?MS:   All extremities are intact.  ?  ?Neurologic:   Confused at times, tremulous  ?  ?  ? ? ? ?Data Reviewed: I have personally reviewed following labs and imaging studies ? ?CBC: ?Recent Labs  ?Lab 05/18/21 ?2320 05/19/21 ?1606 05/21/21 ?0707  ?WBC 6.1 6.8 4.9  ?HGB 11.2* 11.3* 12.0*  ?HCT 34.0* 34.1* 36.8*  ?MCV 91.4 90.5 92.9  ?PLT 92* 95* 90*  ? ?Basic Metabolic Panel: ?Recent Labs  ?Lab 05/18/21 ?2320 05/19/21 ?1606 05/20/21 ?[id number] 05/21/21 ?[id number]  ?NA 140 140 138 136  ?K 4.0 3.4* 3.3* 3.9  ?CL 107 107 107 107  ?CO2 22 21* 23 21*  ?GLUCOSE 91 103* 82 123*  ?BUN <5* <5* <5* <5*  ?CREATININE 0.56* 0.60* 0.60* 0.56*  ?CALCIUM 8.2* 7.9* 7.8* 7.9*  ? ?GFR: ?Estimated Creatinine Clearance: 87 mL/min (A) (by C-G formula based on SCr of 0.56 mg/dL (L)). ?Liver Function Tests: ?Recent Labs  ?Lab  05/18/21 ?2320 05/21/21 ?0707  ?AST 161* 83*  ?ALT 24 17  ?ALKPHOS 315* 237*  ?BILITOT 1.4* 2.1*  ?PROT 6.8 6.1*  ?ALBUMIN 2.7* 2.5*  ? ?Recent Labs  ?Lab 05/18/21 ?2320 05/21/21 ?0707  ?LIPASE 99* 27  ? ?No results for input(s): AMMONIA in the last 168 hours. ?Coagulation Profile: ?No results for input(s): INR, PROTIME in the last 168 hours. ?Cardiac Enzymes: ?No results for input(s): CKTOTAL, CKMB, CKMBINDEX, TROPONINI in the last 168 hours. ?BNP (last 3 results) ?No results for input(s): PROBNP in the last 8760 hours. ?HbA1C: ?No results for input(s): HGBA1C in the last 72 hours. ?CBG: ?No results for input(s): GLUCAP in the last 168 hours. ?Lipid  Profile: ?No results for input(s): CHOL, HDL, LDLCALC, TRIG, CHOLHDL, LDLDIRECT in the last 72 hours. ?Thyroid Function Tests: ?No results for input(s): TSH, T4TOTAL, FREET4, T3FREE, THYROIDAB in the last 72 hours. ?Anemia Panel: ?No results for input(s): VITAMINB12, FOLATE, FERRITIN, TIBC, IRON, RETICCTPCT in the last 72 hours. ?Sepsis Labs: ?No results for input(s): PROCALCITON, LATICACIDVEN in the last 168 hours. ? ?Recent Results (from the past 240 hour(s))  ?C Difficile Quick Screen w PCR reflex     Status: None  ? Collection Time: 05/21/21 12:15 AM  ? Specimen: Rectum; Stool  ?Result Value Ref Range Status  ? C Diff antigen NEGATIVE NEGATIVE Final  ? C Diff toxin NEGATIVE NEGATIVE Final  ? C Diff interpretation No C. difficile detected.  Final  ?  Comment: Performed at [facility], [address], [place], [place] [address]  ?  ? ? ? ? ? ?Radiology Studies: ?No results found. ? ? ? ? ? ?Scheduled Meds: ? amLODipine  5 mg Oral Daily  ? enoxaparin (LOVENOX) injection  40 mg Subcutaneous [id number]  ? folic acid  1 mg Oral Daily  ? gabapentin  300 mg Oral TID  ? LORazepam  0-4 mg Intravenous Q4H  ? Followed by  ? [START ON 05/23/2021] LORazepam  0-4 mg Intravenous Q8H  ? metoprolol tartrate  12.5 mg Oral BID  ? multivitamin with minerals  1 tablet Oral Daily  ? nicotine  14 mg Transdermal Daily  ? pantoprazole  40 mg Oral Daily  ? thiamine  100 mg Oral Daily  ? ?Continuous Infusions: ? sodium chloride 100 mL/hr at 05/21/21 0500  ? cefTRIAXone (ROCEPHIN)  IV 1 g (05/21/21 0857)  ? metronidazole 500 mg (05/21/21 1051)  ? ? ? LOS: 2 days  ? ? ?Time spent: 45 minutes spent on chart review, discussion with nursing staff, consultants, updating family and interview/physical exam; more than 50% of that time was spent in counseling and/or coordination of care. ? ? ? ?Geradine Girt, DO ?[facility] ?Available via Epic secure chat 7am-7pm ?After these hours, please refer to coverage provider listed on  [email or web address] ?05/21/2021, 1:38 PM   ?

## 2021-05-21 NOTE — Progress Notes (Signed)
Patient has progressive become more agitated throughout the shift. Ativan was given 2 times appropriate to calculated CIWA. Throughout shift, patient was advised and educated to stay in bed, not to walk out of the room or get out of bed, and patient continued to try to smoke "a joint" aka marijuana and cigarettes. Patient's lighter, knife, and cigarettes confiscated, bagged and placed in his patient bin in a secure medication room. Patient had 3-4 episodes of incontinent stool in his jeans, bed, and floor. 2 stool covered marijuana cigarettes found in floor and disposed of properly. Patient asks, "Why the hell are you going through my shit?!" Nurse was going through patient's belongings, the stool covered marijuana cigarettes were in the floor due to patient continuously going through his pockets and taking his drugs, cigarettes, lighter, and knife in and out. Soiled patient belongings bagged and tied. Patient cleaned and gown replaced. Patient again attempted to get out of bed and pulled out his IV. IVF stopped, 1 IV attempt failed as patient pulled away and his vein blew, and patient refused anymore IV initiations. IV team consult placed. Patient educated that due to him pulling out his IV, he will not be able to receive his IV pain medications. Patient states, "I don't care, I am going home any damn way." Patient also refused to let nurse lock his wallet up with security and continues to count his money as if staff is trying to steal from him. Wallet currently in bed with patient and he refused to let nurse count his cash for documentation purposes. Patient moved from room 228 to 214 due to being non-compliant, high fall risk, unsteady, and attempted to use contraband in his room. Patient displays no s/s of distress, but remains agitated, confused, and angry.  ?

## 2021-05-21 NOTE — Assessment & Plan Note (Addendum)
-  CIWA- ?Not scoring high on CIWA ? ?

## 2021-05-21 NOTE — Progress Notes (Signed)
Mobility Specialist - Progress Note ? ? ? ? ? 05/21/21 1400  ?Mobility  ?Activity Contraindicated/medical hold  ? ?Per RN, Hold off on mobility at this time d/t pt being significantly agitated and combative. Will attempt at another date and time. ? ?Clarisa Schools ?Mobility Specialist ?05/21/21, 2:08 PM ? ?

## 2021-05-22 DIAGNOSIS — F10931 Alcohol use, unspecified with withdrawal delirium: Secondary | ICD-10-CM | POA: Diagnosis not present

## 2021-05-22 DIAGNOSIS — K861 Other chronic pancreatitis: Secondary | ICD-10-CM | POA: Diagnosis not present

## 2021-05-22 DIAGNOSIS — R1033 Periumbilical pain: Secondary | ICD-10-CM | POA: Diagnosis not present

## 2021-05-22 DIAGNOSIS — K859 Acute pancreatitis without necrosis or infection, unspecified: Secondary | ICD-10-CM | POA: Diagnosis not present

## 2021-05-22 LAB — BASIC METABOLIC PANEL
Anion gap: 6 (ref 5–15)
BUN: <5 mg/dL — ABNORMAL LOW (ref 8–23)
CO2: 22 mmol/L (ref 22–32)
Calcium: 8.1 mg/dL — ABNORMAL LOW (ref 8.9–10.3)
Chloride: 109 mmol/L (ref 98–111)
Creatinine, Ser: 0.59 mg/dL — ABNORMAL LOW (ref 0.61–1.24)
GFR, Estimated: >60 mL/min (ref >60)
Glucose, Bld: 129 mg/dL — ABNORMAL HIGH (ref 70–99)
Potassium: 3.6 mmol/L (ref 3.5–5.1)
Sodium: 137 mmol/L (ref 135–145)

## 2021-05-22 MED ORDER — LOPERAMIDE HCL 2 MG PO CAPS
2.0000 mg | ORAL_CAPSULE | ORAL | Status: DC | PRN
  Administered 2021-05-22 (×2): 2 mg via ORAL
  Filled 2021-05-22 (×2): qty 1

## 2021-05-22 MED ORDER — RISAQUAD PO CAPS
1.0000 | ORAL_CAPSULE | Freq: Every day | ORAL | Status: DC
  Administered 2021-05-22 – 2021-05-23 (×2): 1 via ORAL
  Filled 2021-05-22 (×2): qty 1

## 2021-05-22 MED ORDER — AMOXICILLIN-POT CLAVULANATE 875-125 MG PO TABS
1.0000 | ORAL_TABLET | Freq: Two times a day (BID) | ORAL | Status: DC
  Administered 2021-05-22 – 2021-05-25 (×7): 1 via ORAL
  Filled 2021-05-22 (×7): qty 1

## 2021-05-22 NOTE — Progress Notes (Signed)
?PROGRESS NOTE ? ? ? ?MAICOL Coffey  [id number] DOB: 1955/05/18 DOA: 05/18/2021 ?PCP: [facility]  ? ? ?Brief Narrative:  ?Thomas Coffey is a 66 y.o. male with medical history significant for alcoholic pancreatitis, coronary artery disease status post MI, paroxysmal atrial fibrillation, and COVID-19, who presented to the ER with acute onset of periumbilical abdominal pain that started about a week ago with associated nausea and vomiting.  Has colitis and pancreatitis.  Also began to withdraw with alcohol delirium.    ? ? ?Assessment and Plan: ?* Abdominal pain ?- This is secondary to acute sigmoid colitis and possibly diverticulitis as well as acute pancreatitis ?- changed pain meds to dilaudid with improvement ?-continue antibiotic therapy with IV Rocephin and Flagyl. ?-will need colonoscopy after treatment as an outpatient ?-advanced diet ? ?Abnormal LFTs ?- could be related to mild alcoholic hepatitis. ?- trend ? ?Acute on chronic pancreatitis ([place]) ?- This is associated with alcoholic intoxication. ?-will need to stop drinking ?-CIWA ? ?Cellulitis of axillary region ?-covered with IV Rocephin. ?-appears improved ?- Warm compresses can be applied. ? 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 ? ?Essential hypertension ?-continue antihypertensives ? ? ? ? ? ? ? ? ? ?DVT prophylaxis: enoxaparin (LOVENOX) injection 40 mg Start: 05/19/21 0800 ? ?  Code Status: Full Code ? ? ?Disposition Plan:  ?Level of care: Med-Surg ?Status is: Inpatient ?Remains inpatient appropriate because: going through withdrawal ?  ? ?Consultants:  ?none ? ? ?Subjective: ?Became combative overnight ? ?Objective: ?Vitals:  ? 05/22/21 0102 05/22/21 0414 05/22/21 0729 05/22/21 1137  ?BP: 133/86 (!) 145/96 135/89 (!) 135/91  ?Pulse: 87 97 (!) 103 98  ?Resp: 16 16 20 16   ?Temp: 98  ?F (36.7 ?C) 98.7 ?F (37.1 ?C) 99 ?F (37.2 ?C) 98.9 ?F (37.2 ?C)  ?TempSrc: Oral Oral Oral Oral  ?SpO2: 97% 100% 98% 100%  ?Weight:      ?Height:      ? ? ?Intake/Output Summary (Last 24 hours) at 05/22/2021 1149 ?Last data filed at 05/22/2021 0900 ?Gross per 24 hour  ?Intake 636 ml  ?Output 150 ml  ?Net 486 ml  ? ?Filed Weights  ? 05/18/21 2053 05/21/21 0312  ?Weight: 70.3 kg 66.8 kg  ? ? ?Examination: ? ? ?General: Appearance:    Chronically ill appearing male in no acute distress  ?   ?Lungs:     respirations unlabored  ?Heart:    Normal heart rate.  ?  ?MS:   All extremities are intact.  ?  ?Neurologic:   Confused at times, tremulous  ?  ?  ? ? ? ?Data Reviewed: I have personally reviewed following labs and imaging studies ? ?CBC: ?Recent Labs  ?Lab 05/18/21 ?2320 05/19/21 ?1606 05/21/21 ?0707  ?WBC 6.1 6.8 4.9  ?HGB 11.2* 11.3* 12.0*  ?HCT 34.0* 34.1* 36.8*  ?MCV 91.4 90.5 92.9  ?PLT 92* 95* 90*  ? ?Basic Metabolic Panel: ?Recent Labs  ?Lab 05/18/21 ?2320 05/19/21 ?1606 05/20/21 ?[id number] 05/21/21 ?[id number] 05/22/21 ?[id number]  ?NA 140 140 138 136 137  ?K 4.0 3.4* 3.3* 3.9 3.6  ?CL 107 107 107 107 109  ?CO2 22 21* 23 21* 22  ?GLUCOSE 91 103* 82 123* 129*  ?BUN <5* <5* <5* <5* <5*  ?CREATININE 0.56* 0.60* 0.60* 0.56* 0.59*  ?CALCIUM 8.2* 7.9* 7.8* 7.9* 8.1*  ? ?  GFR: ?Estimated Creatinine Clearance: 87 mL/min (A) (by C-G formula based on SCr of 0.59 mg/dL (L)). ?Liver Function Tests: ?Recent Labs  ?Lab 05/18/21 ?2320 05/21/21 ?0707  ?AST 161* 83*  ?ALT 24 17  ?ALKPHOS 315* 237*  ?BILITOT 1.4* 2.1*  ?PROT 6.8 6.1*  ?ALBUMIN 2.7* 2.5*  ? ?Recent Labs  ?Lab 05/18/21 ?2320 05/21/21 ?0707  ?LIPASE 99* 27  ? ?No results for input(s): AMMONIA in the last 168 hours. ?Coagulation Profile: ?No results for input(s): INR, PROTIME in the last 168 hours. ?Cardiac Enzymes: ?No results for input(s): CKTOTAL, CKMB, CKMBINDEX, TROPONINI in the last 168 hours. ?BNP (last 3 results) ?No results for input(s): PROBNP in the last 8760  hours. ?HbA1C: ?No results for input(s): HGBA1C in the last 72 hours. ?CBG: ?No results for input(s): GLUCAP in the last 168 hours. ?Lipid Profile: ?No results for input(s): CHOL, HDL, LDLCALC, TRIG, CHOLHDL, LDLDIRECT in the last 72 hours. ?Thyroid Function Tests: ?No results for input(s): TSH, T4TOTAL, FREET4, T3FREE, THYROIDAB in the last 72 hours. ?Anemia Panel: ?No results for input(s): VITAMINB12, FOLATE, FERRITIN, TIBC, IRON, RETICCTPCT in the last 72 hours. ?Sepsis Labs: ?No results for input(s): PROCALCITON, LATICACIDVEN in the last 168 hours. ? ?Recent Results (from the past 240 hour(s))  ?C Difficile Quick Screen w PCR reflex     Status: None  ? Collection Time: 05/21/21 12:15 AM  ? Specimen: Rectum; Stool  ?Result Value Ref Range Status  ? C Diff antigen NEGATIVE NEGATIVE Final  ? C Diff toxin NEGATIVE NEGATIVE Final  ? C Diff interpretation No C. difficile detected.  Final  ?  Comment: Performed at [facility], [address] [place], [place] [address]  ?  ? ? ? ? ? ?Radiology Studies: ?No results found. ? ? ? ? ? ?Scheduled Meds: ? acidophilus  1 capsule Oral Daily  ? amLODipine  5 mg Oral Daily  ? amoxicillin-clavulanate  1 tablet Oral Q12H  ? enoxaparin (LOVENOX) injection  40 mg Subcutaneous [id number]  ? folic acid  1 mg Oral Daily  ? gabapentin  300 mg Oral TID  ? LORazepam  0-4 mg Intravenous Q4H  ? Followed by  ? [START ON 05/23/2021] LORazepam  0-4 mg Intravenous Q8H  ? metoprolol tartrate  12.5 mg Oral BID  ? multivitamin with minerals  1 tablet Oral Daily  ? nicotine  14 mg Transdermal Daily  ? pantoprazole  40 mg Oral Daily  ? thiamine  100 mg Oral Daily  ? ?Continuous Infusions: ? sodium chloride 10 mL/hr at 05/22/21 0424  ? ? ? LOS: 3 days  ? ? ?Time spent: 45 minutes spent on chart review, discussion with nursing staff, consultants, updating family and interview/physical exam; more than 50% of that time was spent in counseling and/or coordination of care. ? ? ? ?Geradine Girt,  DO ?[facility] ?Available via Epic secure chat 7am-7pm ?After these hours, please refer to coverage provider listed on [email or web address] ?05/22/2021, 11:49 AM   ?

## 2021-05-22 NOTE — Progress Notes (Signed)
PT Cancellation Note ? ?Patient Details ?Name: Thomas Coffey ?MRN: [id number] ?DOB: 1955/03/28 ? ? ?Cancelled Treatment:    Reason Eval/Treat Not Completed: Fatigue/lethargy limiting ability to participate;Other (comment) Pt received asleep in bed with sitter present reporting pt received haldol ~1 hour prior as pt was becoming aggressive. Pt sleeping soundly at this time. Will f/u as able. ? ?Aleda Grana, PT, DPT ?05/22/21, 11:54 AM ? ? ?Sandi Mariscal ?05/22/2021, 11:50 AM ?

## 2021-05-22 NOTE — Plan of Care (Signed)
?  Problem: Education: ?Goal: Knowledge of General Education information will improve ?Description: Including pain rating scale, medication(s)/side effects and non-pharmacologic comfort measures ?Outcome: Not Progressing ? ?Patient continues to want to leave hospital. Settles down after reorienting and explaining that he needs to stay in the hospital for treatment.  ?  ?Problem: Health Behavior/Discharge Planning: ?Goal: Ability to manage health-related needs will improve ?Outcome: Progressing ?  ?Problem: Nutrition: ?Goal: Adequate nutrition will be maintained ?Outcome: Progressing ?  ?Problem: Safety: ?Goal: Ability to remain free from injury will improve ?Outcome: Progressing ?  ?

## 2021-05-23 ENCOUNTER — Inpatient Hospital Stay: Payer: Medicare Other

## 2021-05-23 DIAGNOSIS — K861 Other chronic pancreatitis: Secondary | ICD-10-CM | POA: Diagnosis not present

## 2021-05-23 DIAGNOSIS — I10 Essential (primary) hypertension: Secondary | ICD-10-CM | POA: Diagnosis not present

## 2021-05-23 DIAGNOSIS — R197 Diarrhea, unspecified: Secondary | ICD-10-CM

## 2021-05-23 DIAGNOSIS — K859 Acute pancreatitis without necrosis or infection, unspecified: Secondary | ICD-10-CM | POA: Diagnosis not present

## 2021-05-23 DIAGNOSIS — R1033 Periumbilical pain: Secondary | ICD-10-CM | POA: Diagnosis not present

## 2021-05-23 MED ORDER — RISAQUAD PO CAPS
2.0000 | ORAL_CAPSULE | Freq: Every day | ORAL | Status: DC
  Administered 2021-05-24 – 2021-05-25 (×2): 2 via ORAL
  Filled 2021-05-23 (×2): qty 2

## 2021-05-23 MED ORDER — SODIUM CHLORIDE 0.9 % IV SOLN: INTRAVENOUS | Status: DC

## 2021-05-23 NOTE — Evaluation (Signed)
Physical Therapy Evaluation ?Patient Details ?Name: Thomas Coffey ?MRN: [id number] ?DOB: 1955/08/16 ?Today's Date: 05/23/2021 ? ?History of Present Illness ? Pt is a 66 y/o M admitted on 05/18/21 after presenting to ED with c/o periumbilical pain x 1 week with associated N&V. Pt is being treated for acute sigmoid colitis & possibly diverticulitis as well as acute pancreatitis. PMH: alcoholic pancreatitis, CAD s/p MI, paroxysmal a-fib, covid-19  ?Clinical Impression ? Pt seen for PT evaluation with pt requesting to sit up to eat breakfast. Pt is able to complete bed mobility with Mod I with HOB fully elevated & step pivot to recliner without AD & supervision. Pt then requests to eat breakfast but is agreeable to attempting ambulation later. Pt reports prior to admission he was living with his brother & his family & was independent without AD, denying falls. Will continue to follow pt acutely to address endurance, balance, and gait & stairs with LRAD. Anticipate pt can d/c home with HHPT f/u. ?    ? ?Recommendations for follow up therapy are one component of a multi-disciplinary discharge planning process, led by the attending physician.  Recommendations may be updated based on patient status, additional functional criteria and insurance authorization. ? ?Follow Up Recommendations Home health PT ? ?  ?Assistance Recommended at Discharge Intermittent Supervision/Assistance  ?Patient can return home with the following ? A little help with walking and/or transfers;A little help with bathing/dressing/bathroom;Assistance with cooking/housework;Direct supervision/assist for financial management;Assist for transportation;Direct supervision/assist for medications management;Help with stairs or ramp for entrance ? ?  ?Equipment Recommendations  (TBD)  ?Recommendations for Other Services ?    ?  ?Functional Status Assessment Patient has had a recent decline in their functional status and demonstrates the ability to make  significant improvements in function in a reasonable and predictable amount of time.  ? ?  ?Precautions / Restrictions Precautions ?Precautions: Fall ?Restrictions ?Weight Bearing Restrictions: No  ? ?  ? ?Mobility ? Bed Mobility ?Overal bed mobility: Modified Independent ?  ?  ?  ?  ?  ?  ?General bed mobility comments: supine>sit with HOB fully elevated ?  ? ?Transfers ?Overall transfer level: Needs assistance ?Equipment used: None ?Transfers: Bed to chair/wheelchair/BSC ?  ?  ?Step pivot transfers: Supervision ?  ?  ?  ?General transfer comment: pt transfers bed>recliner without AD & supervision ?  ? ?Ambulation/Gait ?  ?  ?  ?  ?  ?  ?  ?  ? ?Stairs ?  ?  ?  ?  ?  ? ?Wheelchair Mobility ?  ? ?Modified Rankin (Stroke Patients Only) ?  ? ?  ? ?Balance Overall balance assessment: Needs assistance ?Sitting-balance support: Feet supported ?Sitting balance-Leahy Scale: Good ?  ?  ?Standing balance support: During functional activity, No upper extremity supported ?Standing balance-Leahy Scale: Fair ?  ?  ?  ?  ?  ?  ?  ?  ?  ?  ?  ?  ?   ? ? ? ?Pertinent Vitals/Pain Pain Assessment ?Pain Assessment: No/denies pain  ? ? ?Home Living Family/patient expects to be discharged to:: Unsure ?Living Arrangements:  (brother & his wife & their 2 young children) ?  ?Type of Home: House ?Home Access: Stairs to enter ?Entrance Stairs-Rails: Right;Left;Can reach both ?Entrance Stairs-Number of Steps: 3-4 ?  ?Home Layout: One level ?Home Equipment: None ?   ?  ?Prior Function Prior Level of Function : Independent/Modified Independent ?  ?  ?  ?  ?  ?  ?  ?  ?  ? ? ?  Hand Dominance  ?   ? ?  ?Extremity/Trunk Assessment  ? Upper Extremity Assessment ?Upper Extremity Assessment: Generalized weakness ?  ? ?Lower Extremity Assessment ?Lower Extremity Assessment: Generalized weakness ?  ? ?   ?Communication  ? Communication: No difficulties  ?Cognition Arousal/Alertness: Awake/alert ?Behavior During Therapy: [facility] for tasks  assessed/performed ?Overall Cognitive Status: No family/caregiver present to determine baseline cognitive functioning ?  ?  ?  ?  ?  ?  ?  ?  ?  ?  ?  ?  ?  ?  ?  ?  ?General Comments: Pt AxO to self & time but not location or situation. ?  ?  ? ?  ?General Comments   ? ?  ?Exercises    ? ?Assessment/Plan  ?  ?PT Assessment Patient needs continued PT services  ?PT Problem List Decreased strength;Decreased mobility;Decreased safety awareness;Decreased activity tolerance;Decreased cognition;Decreased balance;Decreased knowledge of use of DME ? ?   ?  ?PT Treatment Interventions Therapeutic activities;DME instruction;Modalities;Gait training;Therapeutic exercise;Stair training;Balance training;Patient/family education;Functional mobility training;Neuromuscular re-education;Manual techniques   ? ?PT Goals (Current goals can be found in the Care Plan section)  ?Acute Rehab PT Goals ?Patient Stated Goal: go home ?PT Goal Formulation: With patient ?Time For Goal Achievement: 06/06/21 ?Potential to Achieve Goals: Good ? ?  ?Frequency Min 2X/week ?  ? ? ?Co-evaluation   ?  ?  ?  ?  ? ? ?  ?AM-PAC PT "6 Clicks" Mobility  ?Outcome Measure Help needed turning from your back to your side while in a flat bed without using bedrails?: None ?Help needed moving from lying on your back to sitting on the side of a flat bed without using bedrails?: None ?Help needed moving to and from a bed to a chair (including a wheelchair)?: None ?Help needed standing up from a chair using your arms (e.g., wheelchair or bedside chair)?: None ?Help needed to walk in hospital room?: A Little ?Help needed climbing 3-5 steps with a railing? : A Little ?6 Click Score: 22 ? ?  ?End of Session   ?Activity Tolerance: Patient tolerated treatment well (limited by pt wishing to eat breakfast) ?Patient left: in chair;with chair alarm set;with call bell/phone within reach ?Nurse Communication: Mobility status ?PT Visit Diagnosis: Muscle weakness (generalized)  (M62.81);Unsteadiness on feet (R26.81);Difficulty in walking, not elsewhere classified (R26.2) ?  ? ?Time: 6789-3810 ?PT Time Calculation (min) (ACUTE ONLY): 11 min ? ? ?Charges:   PT Evaluation ?$PT Eval Moderate Complexity: 1 Mod ?  ?  ?   ? ? ?Aleda Grana, PT, DPT ?05/23/21, 9:23 AM ? ? ?Sandi Mariscal ?05/23/2021, 9:22 AM ? ?

## 2021-05-23 NOTE — Care Management Important Message (Signed)
Important Message ? ?Patient Details  ?Name: Thomas Coffey ?MRN: [id number] ?Date of Birth: 20-Sep-1955 ? ? ?Medicare Important Message Given:  Yes ? ? ? ? ?Johnell Comings ?05/23/2021, 12:22 PM ?

## 2021-05-23 NOTE — Assessment & Plan Note (Signed)
-  c diff negative ?-add lactobacillus ?

## 2021-05-23 NOTE — TOC Progression Note (Addendum)
Transition of Care (TOC) - Progression Note  ? ? ?Patient Details  ?Name: TRAMON CRESCENZO ?MRN: [id number] ?Date of Birth: 1955-05-24 ? ?Transition of Care (TOC) CM/SW Contact  ?Aleena Kirkeby A Tatym Schermer, LCSW ?Phone Number: ?05/23/2021, 3:42 PM ? ?Clinical Narrative:   CSW has reached out to [facility] agencies to see if the can service pt.  ?[facility]- can not service ?[facility]- can not service ?[facility]- can not service ?[facility] longer services [facility] ?[facility]- can not service ?[facility]- declined pt ? ? ? ?  ?  ? ?Expected Discharge Plan and Services ?  ?  ?  ?  ?  ?                ?  ?  ?  ?  ?  ?  ?  ?  ?  ?  ? ? ?Social Determinants of Health (SDOH) Interventions ?  ? ?Readmission Risk Interventions ? ?  12/30/2020  ? 11:53 AM  ?Readmission Risk Prevention Plan  ?HRI or Home Care Consult Patient refused  ?Social Work Consult for Recovery Care Planning/Counseling Complete  ?Palliative Care Screening Not Applicable  ?Medication Review Oceanographer) Complete  ? ? ?

## 2021-05-23 NOTE — Progress Notes (Signed)
?PROGRESS NOTE ? ? ? ?Thomas Coffey  [id number] DOB: 1955/04/26 DOA: 05/18/2021 ?PCP: [facility]  ? ? ?Brief Narrative:  ?Thomas Coffey is a 66 y.o. male with medical history significant for alcoholic pancreatitis, coronary artery disease status post MI, paroxysmal atrial fibrillation, and COVID-19, who presented to the ER with acute onset of periumbilical abdominal pain that started about a week ago with associated nausea and vomiting.  Has colitis and pancreatitis.  Also began to withdraw with alcohol delirium- withdrawal resolved on 5/8.  ? ? ?Assessment and Plan: ?* Abdominal pain ?- This is secondary to acute sigmoid colitis and possibly diverticulitis as well as acute pancreatitis ?- changed pain meds to dilaudid with improvement ?-continue antibiotic therapy with IV Rocephin and Flagyl- change to PO augmentin ?-will need colonoscopy after treatment as an outpatient ?-advanced diet ? ?Abnormal LFTs ?- could be related to mild alcoholic hepatitis. ?- trend ? ?Acute on chronic pancreatitis ([place]) ?- This is associated with alcoholic intoxication. ?-will need to stop drinking ?-CIWA ?-resolved ? ?Cellulitis of axillary region ?-covered with IV Rocephin. ?-appears improved ?- Warm compresses can be applied. ? ?Diarrhea ?-c diff negative ?-add lactobacillus ? 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 ? ?Essential hypertension ?-continue antihypertensives ? ? ? ? ? ? ? ? ? ?DVT prophylaxis: enoxaparin (LOVENOX) injection 40 mg Start: 05/19/21 0800 ? ?  Code Status: Full Code ? ? ?Disposition Plan:  ?Level of care: Med-Surg ?Status is: Inpatient ?Remains inpatient appropriate because: going through withdrawal ?  ? ?Consultants:  ?none ? ? ?Subjective: ?Cooperative today ? ? ?Objective: ?Vitals:  ? 05/22/21 2216 05/23/21 0216 05/23/21 0335 05/23/21 0717  ?BP: (!) 136/91 131/79 (!) 141/89 137/80  ?Pulse: (!) 104 95 (!) 101  (!) 105  ?Resp: 18 17 18 16   ?Temp: 100.1 ?F (37.8 ?C) 98.6 ?F (37 ?C) 99.1 ?F (37.3 ?C) 98.9 ?F (37.2 ?C)  ?TempSrc: Oral Oral    ?SpO2: 98% 98% 98% 96%  ?Weight:      ?Height:      ? ? ?Intake/Output Summary (Last 24 hours) at 05/23/2021 1121 ?Last data filed at 05/23/2021 0500 ?Gross per 24 hour  ?Intake 180 ml  ?Output 500 ml  ?Net -320 ml  ? ?Filed Weights  ? 05/18/21 2053 05/21/21 0312  ?Weight: 70.3 kg 66.8 kg  ? ? ?Examination: ? ? ?General: Appearance:    Thin male in no acute distress  ?   ?Lungs:     respirations unlabored  ?Heart:    Tachycardic.  ?  ?MS:   All extremities are intact.  ?  ?Neurologic:   Awake, alert, cooperative and not tremulous today  ?  ?  ?  ? ? ? ?Data Reviewed: I have personally reviewed following labs and imaging studies ? ?CBC: ?Recent Labs  ?Lab 05/18/21 ?2320 05/19/21 ?1606 05/21/21 ?0707  ?WBC 6.1 6.8 4.9  ?HGB 11.2* 11.3* 12.0*  ?HCT 34.0* 34.1* 36.8*  ?MCV 91.4 90.5 92.9  ?PLT 92* 95* 90*  ? ?Basic Metabolic Panel: ?Recent Labs  ?Lab 05/18/21 ?2320 05/19/21 ?1606 05/20/21 ?[id number] 05/21/21 ?[id number] 05/22/21 ?[id number]  ?NA 140 140 138 136 137  ?K 4.0 3.4* 3.3* 3.9 3.6  ?CL 107 107 107 107 109  ?CO2 22 21* 23 21* 22  ?GLUCOSE 91 103* 82 123* 129*  ?BUN <5* <5* <5* <5* <5*  ?CREATININE 0.56* 0.60* 0.60* 0.56* 0.59*  ?CALCIUM 8.2* 7.9* 7.8* 7.9* 8.1*  ? ?  GFR: ?Estimated Creatinine Clearance: 87 mL/min (A) (by C-G formula based on SCr of 0.59 mg/dL (L)). ?Liver Function Tests: ?Recent Labs  ?Lab 05/18/21 ?2320 05/21/21 ?0707  ?AST 161* 83*  ?ALT 24 17  ?ALKPHOS 315* 237*  ?BILITOT 1.4* 2.1*  ?PROT 6.8 6.1*  ?ALBUMIN 2.7* 2.5*  ? ?Recent Labs  ?Lab 05/18/21 ?2320 05/21/21 ?0707  ?LIPASE 99* 27  ? ?No results for input(s): AMMONIA in the last 168 hours. ?Coagulation Profile: ?No results for input(s): INR, PROTIME in the last 168 hours. ?Cardiac Enzymes: ?No results for input(s): CKTOTAL, CKMB, CKMBINDEX, TROPONINI in the last 168 hours. ?BNP (last 3 results) ?No results for input(s): PROBNP in  the last 8760 hours. ?HbA1C: ?No results for input(s): HGBA1C in the last 72 hours. ?CBG: ?No results for input(s): GLUCAP in the last 168 hours. ?Lipid Profile: ?No results for input(s): CHOL, HDL, LDLCALC, TRIG, CHOLHDL, LDLDIRECT in the last 72 hours. ?Thyroid Function Tests: ?No results for input(s): TSH, T4TOTAL, FREET4, T3FREE, THYROIDAB in the last 72 hours. ?Anemia Panel: ?No results for input(s): VITAMINB12, FOLATE, FERRITIN, TIBC, IRON, RETICCTPCT in the last 72 hours. ?Sepsis Labs: ?No results for input(s): PROCALCITON, LATICACIDVEN in the last 168 hours. ? ?Recent Results (from the past 240 hour(s))  ?C Difficile Quick Screen w PCR reflex     Status: None  ? Collection Time: 05/21/21 12:15 AM  ? Specimen: Rectum; Stool  ?Result Value Ref Range Status  ? C Diff antigen NEGATIVE NEGATIVE Final  ? C Diff toxin NEGATIVE NEGATIVE Final  ? C Diff interpretation No C. difficile detected.  Final  ?  Comment: Performed at [facility], [address] [place], [place] [address]  ?  ? ? ? ? ? ?Radiology Studies: ?DG Chest Port 1 View ? ?Result Date: 05/23/2021 ?CLINICAL DATA:  Aspiration. EXAM: PORTABLE CHEST 1 VIEW COMPARISON:  Chest x-ray dated December 27, 2020. FINDINGS: The heart size and mediastinal contours are within normal limits. Both lungs are clear. The visualized skeletal structures are unremarkable. IMPRESSION: No active disease. Electronically Signed   By: Titus Dubin M.D.   On: 05/23/2021 08:13   ? ? ? ? ? ?Scheduled Meds: ? [START ON 05/24/2021] acidophilus  2 capsule Oral Daily  ? amLODipine  5 mg Oral Daily  ? amoxicillin-clavulanate  1 tablet Oral Q12H  ? enoxaparin (LOVENOX) injection  40 mg Subcutaneous [id number]  ? folic acid  1 mg Oral Daily  ? gabapentin  300 mg Oral TID  ? LORazepam  0-4 mg Intravenous Q4H  ? Followed by  ? LORazepam  0-4 mg Intravenous Q8H  ? metoprolol tartrate  12.5 mg Oral BID  ? multivitamin with minerals  1 tablet Oral Daily  ? nicotine  14 mg Transdermal Daily   ? pantoprazole  40 mg Oral Daily  ? thiamine  100 mg Oral Daily  ? ?Continuous Infusions: ? sodium chloride 10 mL/hr at 05/22/21 0424  ? sodium chloride    ? ? ? LOS: 4 days  ? ? ?Time spent: 45 minutes spent on chart review, discussion with nursing staff, consultants, updating family and interview/physical exam; more than 50% of that time was spent in counseling and/or coordination of care. ? ? ? ?Geradine Girt, DO ?[facility] ?Available via Epic secure chat 7am-7pm ?After these hours, please refer to coverage provider listed on [email or web address] ?05/23/2021, 11:21 AM   ?

## 2021-05-24 DIAGNOSIS — E876 Hypokalemia: Secondary | ICD-10-CM

## 2021-05-24 DIAGNOSIS — R1033 Periumbilical pain: Secondary | ICD-10-CM | POA: Diagnosis not present

## 2021-05-24 DIAGNOSIS — K852 Alcohol induced acute pancreatitis without necrosis or infection: Secondary | ICD-10-CM | POA: Diagnosis not present

## 2021-05-24 DIAGNOSIS — R197 Diarrhea, unspecified: Secondary | ICD-10-CM

## 2021-05-24 LAB — CBC
HCT: 33.9 % — ABNORMAL LOW (ref 39.0–52.0)
Hemoglobin: 11.3 g/dL — ABNORMAL LOW (ref 13.0–17.0)
MCH: 30.4 pg (ref 26.0–34.0)
MCHC: 33.3 g/dL (ref 30.0–36.0)
MCV: 91.1 fL (ref 80.0–100.0)
Platelets: 149 10*3/uL — ABNORMAL LOW (ref 150–400)
RBC: 3.72 MIL/uL — ABNORMAL LOW (ref 4.22–5.81)
RDW: 18.1 % — ABNORMAL HIGH (ref 11.5–15.5)
WBC: 8.1 10*3/uL (ref 4.0–10.5)
nRBC: 0 % (ref 0.0–0.2)

## 2021-05-24 LAB — COMPREHENSIVE METABOLIC PANEL
ALT: 16 U/L (ref 0–44)
AST: 74 U/L — ABNORMAL HIGH (ref 15–41)
Albumin: 2.4 g/dL — ABNORMAL LOW (ref 3.5–5.0)
Alkaline Phosphatase: 194 U/L — ABNORMAL HIGH (ref 38–126)
Anion gap: 7 (ref 5–15)
BUN: 5 mg/dL — ABNORMAL LOW (ref 8–23)
CO2: 23 mmol/L (ref 22–32)
Calcium: 8.1 mg/dL — ABNORMAL LOW (ref 8.9–10.3)
Chloride: 110 mmol/L (ref 98–111)
Creatinine, Ser: 0.59 mg/dL — ABNORMAL LOW (ref 0.61–1.24)
GFR, Estimated: >60 mL/min (ref >60)
Glucose, Bld: 109 mg/dL — ABNORMAL HIGH (ref 70–99)
Potassium: 2.9 mmol/L — ABNORMAL LOW (ref 3.5–5.1)
Sodium: 140 mmol/L (ref 135–145)
Total Bilirubin: 1.4 mg/dL — ABNORMAL HIGH (ref 0.3–1.2)
Total Protein: 5.7 g/dL — ABNORMAL LOW (ref 6.5–8.1)

## 2021-05-24 LAB — MAGNESIUM: Magnesium: 1.6 mg/dL — ABNORMAL LOW (ref 1.7–2.4)

## 2021-05-24 MED ORDER — MAGNESIUM SULFATE 2 GM/50ML IV SOLN
2.0000 g | Freq: Once | INTRAVENOUS | Status: AC
  Administered 2021-05-24: 2 g via INTRAVENOUS
  Filled 2021-05-24: qty 50

## 2021-05-24 MED ORDER — POTASSIUM CHLORIDE CRYS ER 20 MEQ PO TBCR
40.0000 meq | EXTENDED_RELEASE_TABLET | ORAL | Status: AC
  Administered 2021-05-24 (×3): 40 meq via ORAL
  Filled 2021-05-24 (×3): qty 2

## 2021-05-24 NOTE — Assessment & Plan Note (Signed)
-  replete PO along with IV magnesium ?-suspect due to diarrhea ?

## 2021-05-24 NOTE — Progress Notes (Signed)
?PROGRESS NOTE ? ? ? ?Thomas Coffey  [id number] DOB: December 01, 1955 DOA: 05/18/2021 ?PCP: [facility]  ? ? ?Brief Narrative:  ?Thomas Coffey is a 66 y.o. male with medical history significant for alcoholic pancreatitis, coronary artery disease status post MI, paroxysmal atrial fibrillation, and COVID-19, who presented to the ER with acute onset of periumbilical abdominal pain that started about a week ago with associated nausea and vomiting.  Has colitis and pancreatitis.  Also began to withdraw with alcohol delirium- withdrawal resolved on 5/8.  Home in 24-48 hours.  ? ? ?Assessment and Plan: ?* Abdominal pain ?- This is secondary to acute sigmoid colitis and possibly diverticulitis as well as acute pancreatitis ?- changed pain meds to dilaudid with improvement ?- IV Rocephin and Flagyl- changed to PO augmentin ?-will need colonoscopy after treatment as an outpatient ?-advanced diet ? ?Abnormal LFTs ?- could be related to mild alcoholic hepatitis. ?- trend ? ?Acute on chronic pancreatitis ([place]) ?- This is associated with alcoholic intoxication. ?-will need to stop drinking ?-CIWA ?-resolved ? ?Cellulitis of axillary region ?-covered with IV Rocephin. ?-appears improved ?- Warm compresses can be applied. ? ?Diarrhea ?-c diff negative ?-add lactobacillus ? 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 ? ?Essential hypertension ?-continue antihypertensives ? ?Hypokalemia ?-replete PO along with IV magnesium ?-suspect due to diarrhea ? ? ? ? ? ? ? ? ? ?DVT prophylaxis: enoxaparin (LOVENOX) injection 40 mg Start: 05/19/21 0800 ? ?  Code Status: Full Code ? ? ?Disposition Plan:  ?Level of care: Med-Surg ?Status is: Inpatient ?Remains inpatient appropriate because: lowK and Mg, weak appearing ?  ? ?Consultants:  ?none ? ? ?Subjective: ?No sign of withdrawal today ? ? ?Objective: ?Vitals:  ? 05/23/21 2000 05/23/21 2128 05/24/21 0448  05/24/21 [id number]  ?BP: 122/79 122/79 125/83 125/76  ?Pulse: 92  95 84  ?Resp:  18 16   ?Temp:  98.8 ?F (37.1 ?C) 98.7 ?F (37.1 ?C) 98 ?F (36.7 ?C)  ?TempSrc:   Oral Oral  ?SpO2:  100% 97% 98%  ?Weight:      ?Height:      ? ? ?Intake/Output Summary (Last 24 hours) at 05/24/2021 1317 ?Last data filed at 05/24/2021 [id number] ?Gross per 24 hour  ?Intake 2062.49 ml  ?Output 50 ml  ?Net 2012.49 ml  ? ?Filed Weights  ? 05/18/21 2053 05/21/21 0312  ?Weight: 70.3 kg 66.8 kg  ? ? ?Examination: ? ? ? ?General: Appearance:    Weak appearing, male in no acute distress  ?   ?Lungs:      respirations unlabored  ?Heart:    Normal heart rate. .  ?  ?MS:   All extremities are intact.  ?  ?Neurologic:   Awake, alert, no sign of withdrawal  ?  ?  ?  ?  ? ? ? ?Data Reviewed: I have personally reviewed following labs and imaging studies ? ?CBC: ?Recent Labs  ?Lab 05/18/21 ?2320 05/19/21 ?1606 05/21/21 ?[id number] 05/24/21 ?[id number]  ?WBC 6.1 6.8 4.9 8.1  ?HGB 11.2* 11.3* 12.0* 11.3*  ?HCT 34.0* 34.1* 36.8* 33.9*  ?MCV 91.4 90.5 92.9 91.1  ?PLT 92* 95* 90* 149*  ? ?Basic Metabolic Panel: ?Recent Labs  ?Lab 05/19/21 ?1606 05/20/21 ?[id number] 05/21/21 ?[id number] 05/22/21 ?[id number] 05/24/21 ?[id number]  ?NA 140 138 136 137 140  ?K 3.4* 3.3* 3.9 3.6 2.9*  ?CL 107 107 107 109 110  ?CO2 21* 23 21* 22 23  ?GLUCOSE 103*  82 123* 129* 109*  ?BUN <5* <5* <5* <5* 5*  ?CREATININE 0.60* 0.60* 0.56* 0.59* 0.59*  ?CALCIUM 7.9* 7.8* 7.9* 8.1* 8.1*  ?MG  --   --   --   --  1.6*  ? ?GFR: ?Estimated Creatinine Clearance: 87 mL/min (A) (by C-G formula based on SCr of 0.59 mg/dL (L)). ?Liver Function Tests: ?Recent Labs  ?Lab 05/18/21 ?2320 05/21/21 ?[id number] 05/24/21 ?[id number]  ?AST 161* 83* 74*  ?ALT 24 17 16   ?ALKPHOS 315* 237* 194*  ?BILITOT 1.4* 2.1* 1.4*  ?PROT 6.8 6.1* 5.7*  ?ALBUMIN 2.7* 2.5* 2.4*  ? ?Recent Labs  ?Lab 05/18/21 ?2320 05/21/21 ?0707  ?LIPASE 99* 27  ? ?No results for input(s): AMMONIA in the last 168 hours. ?Coagulation Profile: ?No results for input(s): INR, PROTIME in the last 168  hours. ?Cardiac Enzymes: ?No results for input(s): CKTOTAL, CKMB, CKMBINDEX, TROPONINI in the last 168 hours. ?BNP (last 3 results) ?No results for input(s): PROBNP in the last 8760 hours. ?HbA1C: ?No results for input(s): HGBA1C in the last 72 hours. ?CBG: ?No results for input(s): GLUCAP in the last 168 hours. ?Lipid Profile: ?No results for input(s): CHOL, HDL, LDLCALC, TRIG, CHOLHDL, LDLDIRECT in the last 72 hours. ?Thyroid Function Tests: ?No results for input(s): TSH, T4TOTAL, FREET4, T3FREE, THYROIDAB in the last 72 hours. ?Anemia Panel: ?No results for input(s): VITAMINB12, FOLATE, FERRITIN, TIBC, IRON, RETICCTPCT in the last 72 hours. ?Sepsis Labs: ?No results for input(s): PROCALCITON, LATICACIDVEN in the last 168 hours. ? ?Recent Results (from the past 240 hour(s))  ?C Difficile Quick Screen w PCR reflex     Status: None  ? Collection Time: 05/21/21 12:15 AM  ? Specimen: Rectum; Stool  ?Result Value Ref Range Status  ? C Diff antigen NEGATIVE NEGATIVE Final  ? C Diff toxin NEGATIVE NEGATIVE Final  ? C Diff interpretation No C. difficile detected.  Final  ?  Comment: Performed at [facility], [address] [place], [place] [address]  ?  ? ? ? ? ? ?Radiology Studies: ?DG Chest Port 1 View ? ?Result Date: 05/23/2021 ?CLINICAL DATA:  Aspiration. EXAM: PORTABLE CHEST 1 VIEW COMPARISON:  Chest x-ray dated December 27, 2020. FINDINGS: The heart size and mediastinal contours are within normal limits. Both lungs are clear. The visualized skeletal structures are unremarkable. IMPRESSION: No active disease. Electronically Signed   By: Titus Dubin M.D.   On: 05/23/2021 08:13   ? ? ? ? ? ?Scheduled Meds: ? acidophilus  2 capsule Oral Daily  ? amLODipine  5 mg Oral Daily  ? amoxicillin-clavulanate  1 tablet Oral Q12H  ? enoxaparin (LOVENOX) injection  40 mg Subcutaneous [id number]  ? folic acid  1 mg Oral Daily  ? gabapentin  300 mg Oral TID  ? LORazepam  0-4 mg Intravenous Q8H  ? metoprolol tartrate  12.5 mg  Oral BID  ? multivitamin with minerals  1 tablet Oral Daily  ? nicotine  14 mg Transdermal Daily  ? pantoprazole  40 mg Oral Daily  ? potassium chloride  40 mEq Oral Q4H  ? thiamine  100 mg Oral Daily  ? ?Continuous Infusions: ? sodium chloride 10 mL/hr at 05/22/21 0424  ? magnesium sulfate bolus IVPB 2 g (05/24/21 1219)  ? ? ? LOS: 5 days  ? ? ?Time spent: 45 minutes spent on chart review, discussion with nursing staff, consultants, updating family and interview/physical exam; more than 50% of that time was spent in counseling and/or coordination of care. ? ? ? ?  Geradine Girt, DO ?[facility] ?Available via Epic secure chat 7am-7pm ?After these hours, please refer to coverage provider listed on [email or web address] ?05/24/2021, 1:17 PM   ?

## 2021-05-24 NOTE — Progress Notes (Signed)
Mobility Specialist - Progress Note ? ? 05/24/21 1500  ?Mobility  ?Activity Ambulated with assistance in hallway  ?Level of Assistance Standby assist, set-up cues, supervision of patient - no hands on  ?Assistive Device None  ?Distance Ambulated (ft) 220 ft  ?Activity Response Tolerated well  ?$Mobility charge 1 Mobility  ? ? ? ?Pt lying in bed upon arrival, utilizing RA. Pt voiced generalized pain throughout body; does specify mostly LE during ambulation. Pt reports already receiving pain medicine before entry. No dizziness. Pt left in bed with alarm set, needs in reach.  ? ? ?Kathee Delton ?Mobility Specialist ?05/24/21, 3:05 PM ? ? ?

## 2021-05-25 DIAGNOSIS — S30861A Insect bite (nonvenomous) of abdominal wall, initial encounter: Secondary | ICD-10-CM

## 2021-05-25 DIAGNOSIS — K529 Noninfective gastroenteritis and colitis, unspecified: Secondary | ICD-10-CM

## 2021-05-25 DIAGNOSIS — W57XXXA Bitten or stung by nonvenomous insect and other nonvenomous arthropods, initial encounter: Secondary | ICD-10-CM

## 2021-05-25 DIAGNOSIS — L03119 Cellulitis of unspecified part of limb: Secondary | ICD-10-CM

## 2021-05-25 DIAGNOSIS — K852 Alcohol induced acute pancreatitis without necrosis or infection: Principal | ICD-10-CM

## 2021-05-25 DIAGNOSIS — S40862A Insect bite (nonvenomous) of left upper arm, initial encounter: Secondary | ICD-10-CM

## 2021-05-25 LAB — BASIC METABOLIC PANEL
Anion gap: 3 — ABNORMAL LOW (ref 5–15)
BUN: <5 mg/dL — ABNORMAL LOW (ref 8–23)
CO2: 21 mmol/L — ABNORMAL LOW (ref 22–32)
Calcium: 8.5 mg/dL — ABNORMAL LOW (ref 8.9–10.3)
Chloride: 114 mmol/L — ABNORMAL HIGH (ref 98–111)
Creatinine, Ser: 0.63 mg/dL (ref 0.61–1.24)
GFR, Estimated: >60 mL/min (ref >60)
Glucose, Bld: 129 mg/dL — ABNORMAL HIGH (ref 70–99)
Potassium: 4.2 mmol/L (ref 3.5–5.1)
Sodium: 138 mmol/L (ref 135–145)

## 2021-05-25 LAB — CBC
HCT: 36.4 % — ABNORMAL LOW (ref 39.0–52.0)
Hemoglobin: 11.9 g/dL — ABNORMAL LOW (ref 13.0–17.0)
MCH: 29.9 pg (ref 26.0–34.0)
MCHC: 32.7 g/dL (ref 30.0–36.0)
MCV: 91.5 fL (ref 80.0–100.0)
Platelets: 181 10*3/uL (ref 150–400)
RBC: 3.98 MIL/uL — ABNORMAL LOW (ref 4.22–5.81)
RDW: 18.2 % — ABNORMAL HIGH (ref 11.5–15.5)
WBC: 7.5 10*3/uL (ref 4.0–10.5)
nRBC: 0 % (ref 0.0–0.2)

## 2021-05-25 MED ORDER — OXYCODONE HCL 5 MG PO TABS
5.0000 mg | ORAL_TABLET | Freq: Four times a day (QID) | ORAL | Status: DC | PRN
Start: 2021-05-25 — End: 2021-05-25

## 2021-05-25 MED ORDER — LOPERAMIDE HCL 2 MG PO CAPS: 2.0000 mg | ORAL_CAPSULE | ORAL | 0 refills | Status: DC | PRN

## 2021-05-25 MED ORDER — AMOXICILLIN-POT CLAVULANATE 875-125 MG PO TABS
1.0000 | ORAL_TABLET | Freq: Two times a day (BID) | ORAL | 0 refills | Status: AC
Start: 2021-05-25 — End: 2021-05-27

## 2021-05-25 MED ORDER — OXYCODONE HCL 5 MG PO TABS: 5.0000 mg | ORAL_TABLET | Freq: Four times a day (QID) | ORAL | 0 refills | Status: DC | PRN

## 2021-05-25 MED ORDER — HYDROCORTISONE 1 % EX CREA
TOPICAL_CREAM | CUTANEOUS | 0 refills | Status: DC | PRN
Start: 2021-05-25 — End: 2022-03-27

## 2021-05-25 MED ORDER — RISAQUAD PO CAPS: 2.0000 | ORAL_CAPSULE | Freq: Every day | ORAL | 0 refills | Status: DC

## 2021-05-25 NOTE — TOC Transition Note (Signed)
Transition of Care (TOC) - CM/SW Discharge Note ? ? ?Patient Details  ?Name: Thomas Coffey ?MRN: [id number] ?Date of Birth: [birth year] ? ?Transition of Care (TOC) CM/SW Contact:  ?Margarito Liner, LCSW ?Phone Number: ?05/25/2021, 11:08 AM ? ? ?Clinical Narrative:   Patient has orders to discharge home today. Patient up in the room independently. Went in to let him know we were unable to secure home health. He expressed understanding and declined outpatient therapy. No further concerns. CSW signing off. ? ?Final next level of care: Home/Self Care ?Barriers to Discharge: Barriers Resolved ? ? ?Patient Goals and CMS Choice ?  ?  ?  ? ?Discharge Placement ?  ?           ?  ?Patient to be transferred to facility by: Son ?  ?Patient and family notified of of transfer: 05/25/21 ? ?Discharge Plan and Services ?  ?  ?           ?  ?  ?  ?  ?  ?  ?  ?  ?  ?  ? ?Social Determinants of Health (SDOH) Interventions ?  ? ? ?Readmission Risk Interventions ? ?  12/30/2020  ? 11:53 AM  ?Readmission Risk Prevention Plan  ?HRI or Home Care Consult Patient refused  ?Social Work Consult for Recovery Care Planning/Counseling Complete  ?Palliative Care Screening Not Applicable  ?Medication Review Oceanographer) Complete  ? ? ? ? ? ?

## 2021-05-25 NOTE — Discharge Summary (Signed)
?Physician Discharge Summary ?  ?Patient: Thomas Coffey MRN: [id number] DOB: 1955-05-02  ?Admit date:     05/18/2021  ?Discharge date: 05/25/21  ?Discharge Physician: Sunnie Nielsen  ? ?PCP: [facility]  ? ?Recommendations at discharge:  ? Follow w/ PCP in 1-2 weeks to monitor LFT, Lipase, EtOH abstinence ?Pt instructed not to use alcohol or any recreational drugs  ? ?Discharge Diagnoses: ?Principal Problem: ?  Abdominal pain ?Active Problems: ?  Abnormal LFTs ?  Acute on chronic pancreatitis (HCC) ?  Cellulitis of axillary region ?  Hypokalemia ?  Essential hypertension ?  Alcohol withdrawal delirium (HCC) ?  Diarrhea ? ?Resolved Problems: ?  * No resolved hospital problems. * ? ?Hospital Course: ?Thomas Coffey is a 66 y.o. male with medical history significant for alcoholic pancreatitis, coronary artery disease status post MI, paroxysmal atrial fibrillation, and COVID-19, who presented to the ER with acute onset of periumbilical abdominal pain that started about a week ago with associated nausea and vomiting.  Has colitis and pancreatitis.  Also began to withdraw with alcohol delirium- withdrawal resolved on 5/8.  Continued to monitor, attempted to arrange home health but were unsuccessful and patietn opted to not pursue this aggressively. Discharged home 05/10 ? ?Assessment and Plan: ?* Abdominal pain ?- This is secondary to acute sigmoid colitis and possibly diverticulitis as well as acute pancreatitis ?- changed pain meds to dilaudid with improvement --> discharged on limited po meds ?- IV Rocephin and Flagyl --> changed to PO augmentin ?-will need colonoscopy after treatment as an outpatient ?-advanced diet tolerated  ? ?Diarrhea ?-c diff negative ?-add lactobacillus ? ?Alcohol withdrawal delirium (HCC) ?-CIWA- ?Not scoring high on CIWA ? ? ?Essential hypertension ?-continue antihypertensives ? ?Cellulitis of axillary region ?-covered with IV Rocephin. ?-appears improved ?- Warm  compresses can be applied. ? ?Hypokalemia ?-replete PO along with IV magnesium ?-suspect due to diarrhea ? ?Abnormal LFTs ?- could be related to mild alcoholic hepatitis. ?- trend down ? ?Acute on chronic pancreatitis (HCC) ?- This is associated with alcoholic intoxication. ?-will need to stop drinking ?-CIWA ?-resolved ? ? ? ? ? ? ?Pain control - [employer] Controlled Substance Reporting System database was reviewed. and patient was instructed, not to drive, operate heavy machinery, perform activities at heights, swimming or participation in water activities or provide baby-sitting services while on Pain, Sleep and Anxiety Medications; until their outpatient Physician has advised to do so again. Also recommended to not to take more than prescribed Pain, Sleep and Anxiety Medications.  ?Consultants: none ?Procedures performed: none  ?Disposition: Home ?Diet recommendation:  ?Discharge Diet Orders (From admission, onward)  ? ?  Start     Ordered  ? 05/25/21 0000  Diet - low sodium heart healthy       ? 05/25/21 1015  ? ?  ?  ? ?  ? ?Cardiac diet ?DISCHARGE MEDICATION: ?Allergies as of 05/25/2021   ?No Known Allergies ?  ? ?  ?Medication List  ?  ? ?STOP taking these medications   ? ?gabapentin 300 MG capsule ?Commonly known as: NEURONTIN ?  ? ?  ? ?TAKE these medications   ? ?acidophilus Caps capsule ?Take 2 capsules by mouth daily. ?Start taking on: May 26, 2021 ?  ?amLODipine 5 MG tablet ?Commonly known as: NORVASC ?Take 1 tablet (5 mg total) by mouth daily. ?  ?amoxicillin-clavulanate 875-125 MG tablet ?Commonly known as: AUGMENTIN ?Take 1 tablet by mouth every 12 (twelve) hours for 3 doses. ?  ?  folic acid 1 MG tablet ?Commonly known as: FOLVITE ?Take 1 tablet (1 mg total) by mouth daily. ?  ?hydrocortisone cream 1 % ?Apply topically as needed for itching. ?  ?loperamide 2 MG capsule ?Commonly known as: IMODIUM ?Take 1 capsule (2 mg total) by mouth as needed for diarrhea or loose stools. ?  ?Lyrica 75 MG  capsule ?Generic drug: pregabalin ?Take 75 mg by mouth 3 (three) times daily. ?  ?metoprolol tartrate 25 MG tablet ?Commonly known as: LOPRESSOR ?Take 0.5 tablets (12.5 mg total) by mouth 2 (two) times daily. ?  ?multivitamin with minerals Tabs tablet ?Take 1 tablet by mouth daily. ?  ?ondansetron 4 MG tablet ?Commonly known as: Zofran ?Take 1 tablet (4 mg total) by mouth every 8 (eight) hours as needed for nausea or vomiting. ?  ?oxyCODONE 5 MG immediate release tablet ?Commonly known as: Oxy IR/ROXICODONE ?Take 1-2 tablets (5-10 mg total) by mouth every 6 (six) hours as needed for moderate pain or severe pain. ?  ?pantoprazole 40 MG tablet ?Commonly known as: PROTONIX ?Take 1 tablet (40 mg total) by mouth daily. ?  ?potassium chloride SA 20 MEQ tablet ?Commonly known as: KLOR-CON M ?Take 0.5 tablets (10 mEq total) by mouth daily. ?  ?thiamine 100 MG tablet ?Take 1 tablet (100 mg total) by mouth daily. ?  ? ?  ? ? Follow-up Information   ? ? [facility] In 1 week.   ?Why: As needed if symptoms have not improved, Return to ER for worsening symptoms ?Contact information: ?[address] ?[place] [address] ?[phone] ? ? ?  ?  ? ? [facility] In 1 week.   ?Why: As needed if symptoms have not improved, Return to ER for worsening symptoms ?Contact information: ?[address] ?[place] [address] ?[phone] ? ? ?  ?  ? ?  ?  ? ?  ? ?Discharge Exam: ?Filed Weights  ? 05/18/21 2053 05/21/21 0312  ?Weight: 70.3 kg 66.8 kg  ? ?Constitutional:  ?VSS ?General Appearance: alert, well-developed, well-nourished, NAD ?Ears, Nose, Mouth, Throat: ?Normal appearance ?Neck: ?No masses, trachea midline ?Respiratory: ?Normal respiratory effort ?Breath sounds normal, no wheeze/rhonchi/rales ?Cardiovascular: ?S1/S2 normal, no murmur/rub/gallop auscultated ?No lower extremity edema ?Gastrointestinal: ?Nontender, no masses ?Musculoskeletal:  ?Gait normal ?Neurological: ?No cranial  nerve deficit on limited exam ?Motor and sensation intact and symmetric ?Psychiatric: ?Normal judgment/insight ?Normal mood and affect ? ? ?Condition at discharge: good ? ?The results of significant diagnostics from this hospitalization (including imaging, microbiology, ancillary and laboratory) are listed below for reference.  ? ?Imaging Studies: ?CT ABDOMEN PELVIS W CONTRAST ? ?Result Date: 05/19/2021 ?CLINICAL DATA:  Abdominal pain with history of recurrent pancreatitis, chronic pancreatitis. EXAM: CT ABDOMEN AND PELVIS WITH CONTRAST TECHNIQUE: Multidetector CT imaging of the abdomen and pelvis was performed using the standard protocol following bolus administration of intravenous contrast. RADIATION DOSE REDUCTION: This exam was performed according to the departmental dose-optimization program which includes automated exposure control, adjustment of the mA and/or kV according to patient size and/or use of iterative reconstruction technique. CONTRAST:  OMNIPAQUE IOHEXOL 300 MG/ML  SOLN COMPARISON:  CT with contrast MRI without and with contrast of 12/28/2020, CT without contrast 07/04/2020 FINDINGS: Lower chest: No acute abnormality. Coronary artery calcifications are again noted. Hepatobiliary: Heterogeneous low attenuation is noted consistent with prior MRI finding of heterogeneous hepatic steatosis. 20.5 cm in length with no mass enhancement. Gallbladder is mildly distended containing several stones but no wall thickening. No biliary  dilatation. Pancreas: Multiple calcifications consistent with chronic calcific pancreatitis. There previously was a 3.3 cm pseudocyst in the pancreatic head which is decreased in size to 2 cm. There is mild stable dilatation of the pancreatic duct, disproportionate atrophy in the tail segment, and no mass enhancement. Faint stranding changes are noted adjacent the head and neck segments, and could be due to recurrent pancreatitis or fat scarring from the prior pancreatitis.  Spleen: Normal in size with homogeneous enhancement allowing for respiratory motion. Adrenals/Urinary Tract: No hydronephrosis or urinary stone disease. Small areas of scattered cortical scarring without mass enh

## 2021-05-25 NOTE — Progress Notes (Signed)
Pre-rounds chart review and preliminary plan / goals for today: ?Significant overnight events per chart: none ?Significant new findings on labs/other diagnostics:  ?LFT improved ?Potassium improved  ?Dispo plan: home w/ HH ?HH has been difficult to arrange per TOC note 05/23/21 ?Pending/Plan: clinical improvement. He is on PO abx  ? ?Please feel free to reach out via secure chat in Epic for non-urgent issues. Please page for urgent matters! ? ?This note will be updated after rounds to full progress note / discharge note.  ? ?

## 2021-07-03 IMAGING — CR DG LUMBAR SPINE 2-3V
1 series · 3 of 3 positions shown · non-contrast
Comparison: 07/18/2005

CLINICAL DATA: Radicular pain to RIGHT LOWER extremity.

EXAM:
LUMBAR SPINE - 2-3 VIEW

[Series 1: dg lumbar spine 2-3 views · 0.14mm/px · 3 of 3 slices shown]
[im 1/3]
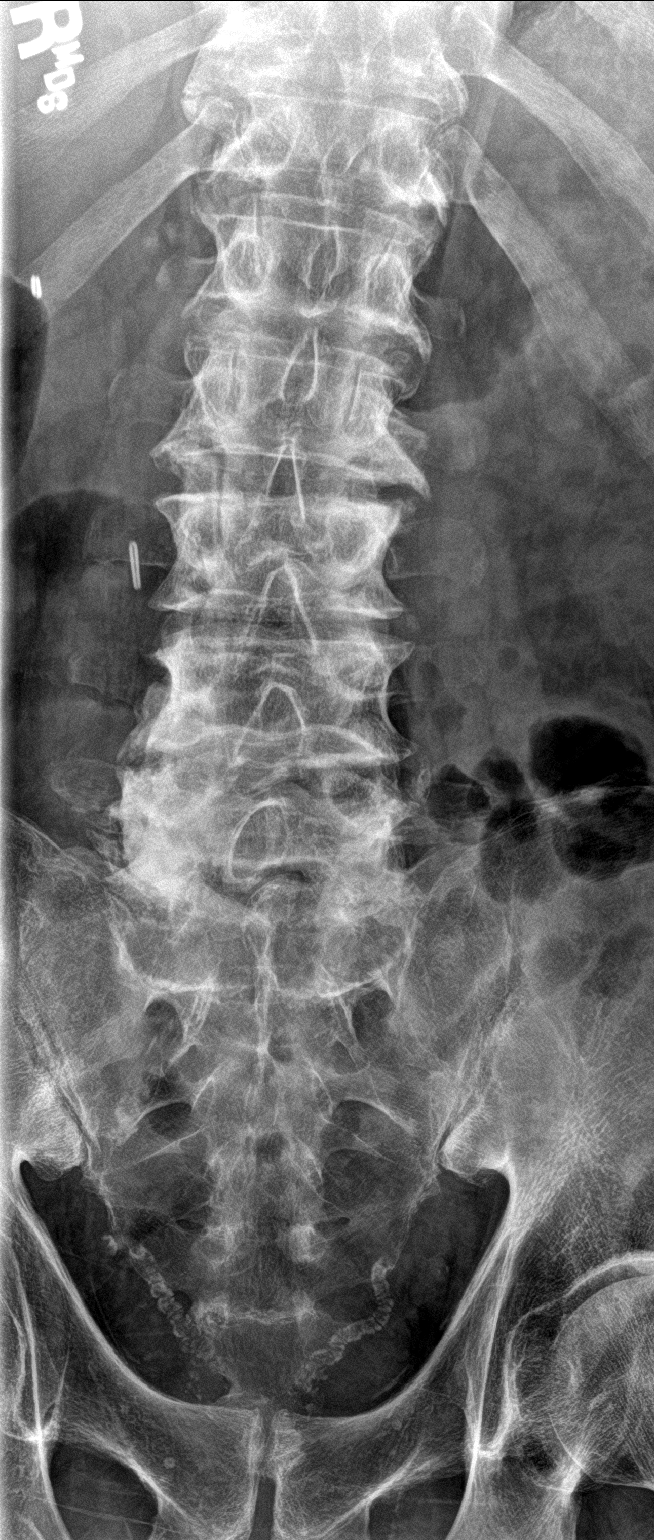
[im 2/3]
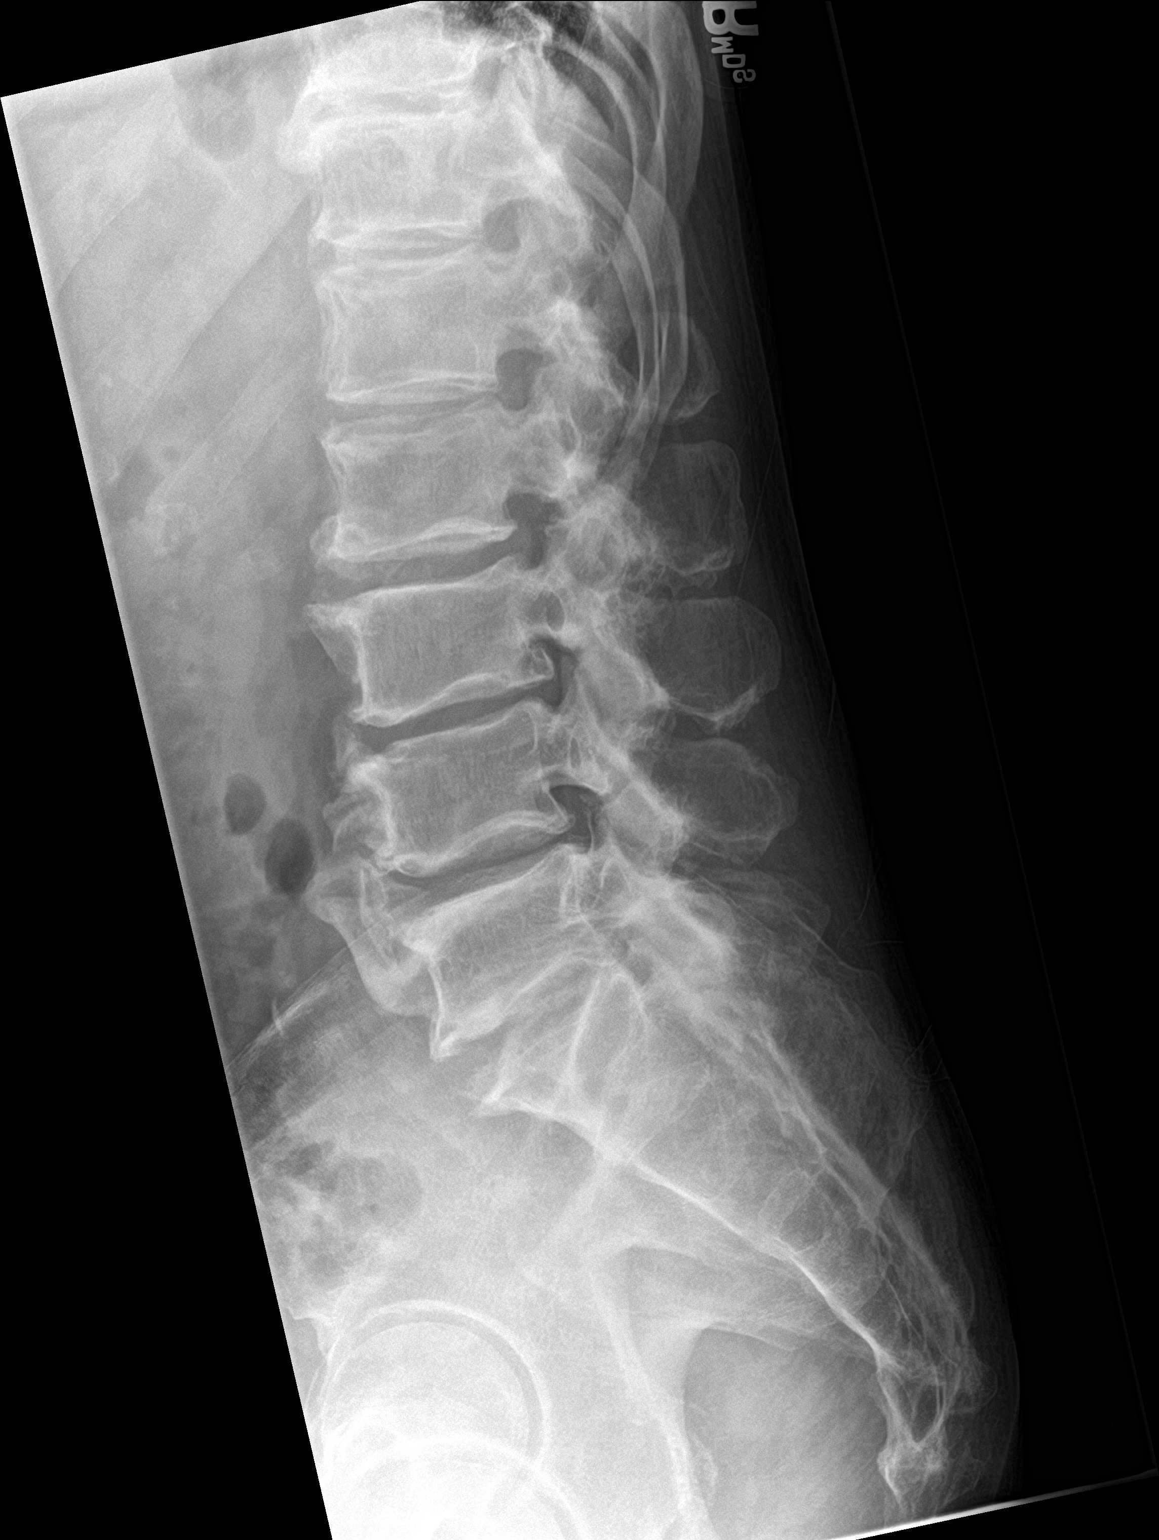
[im 3/3]
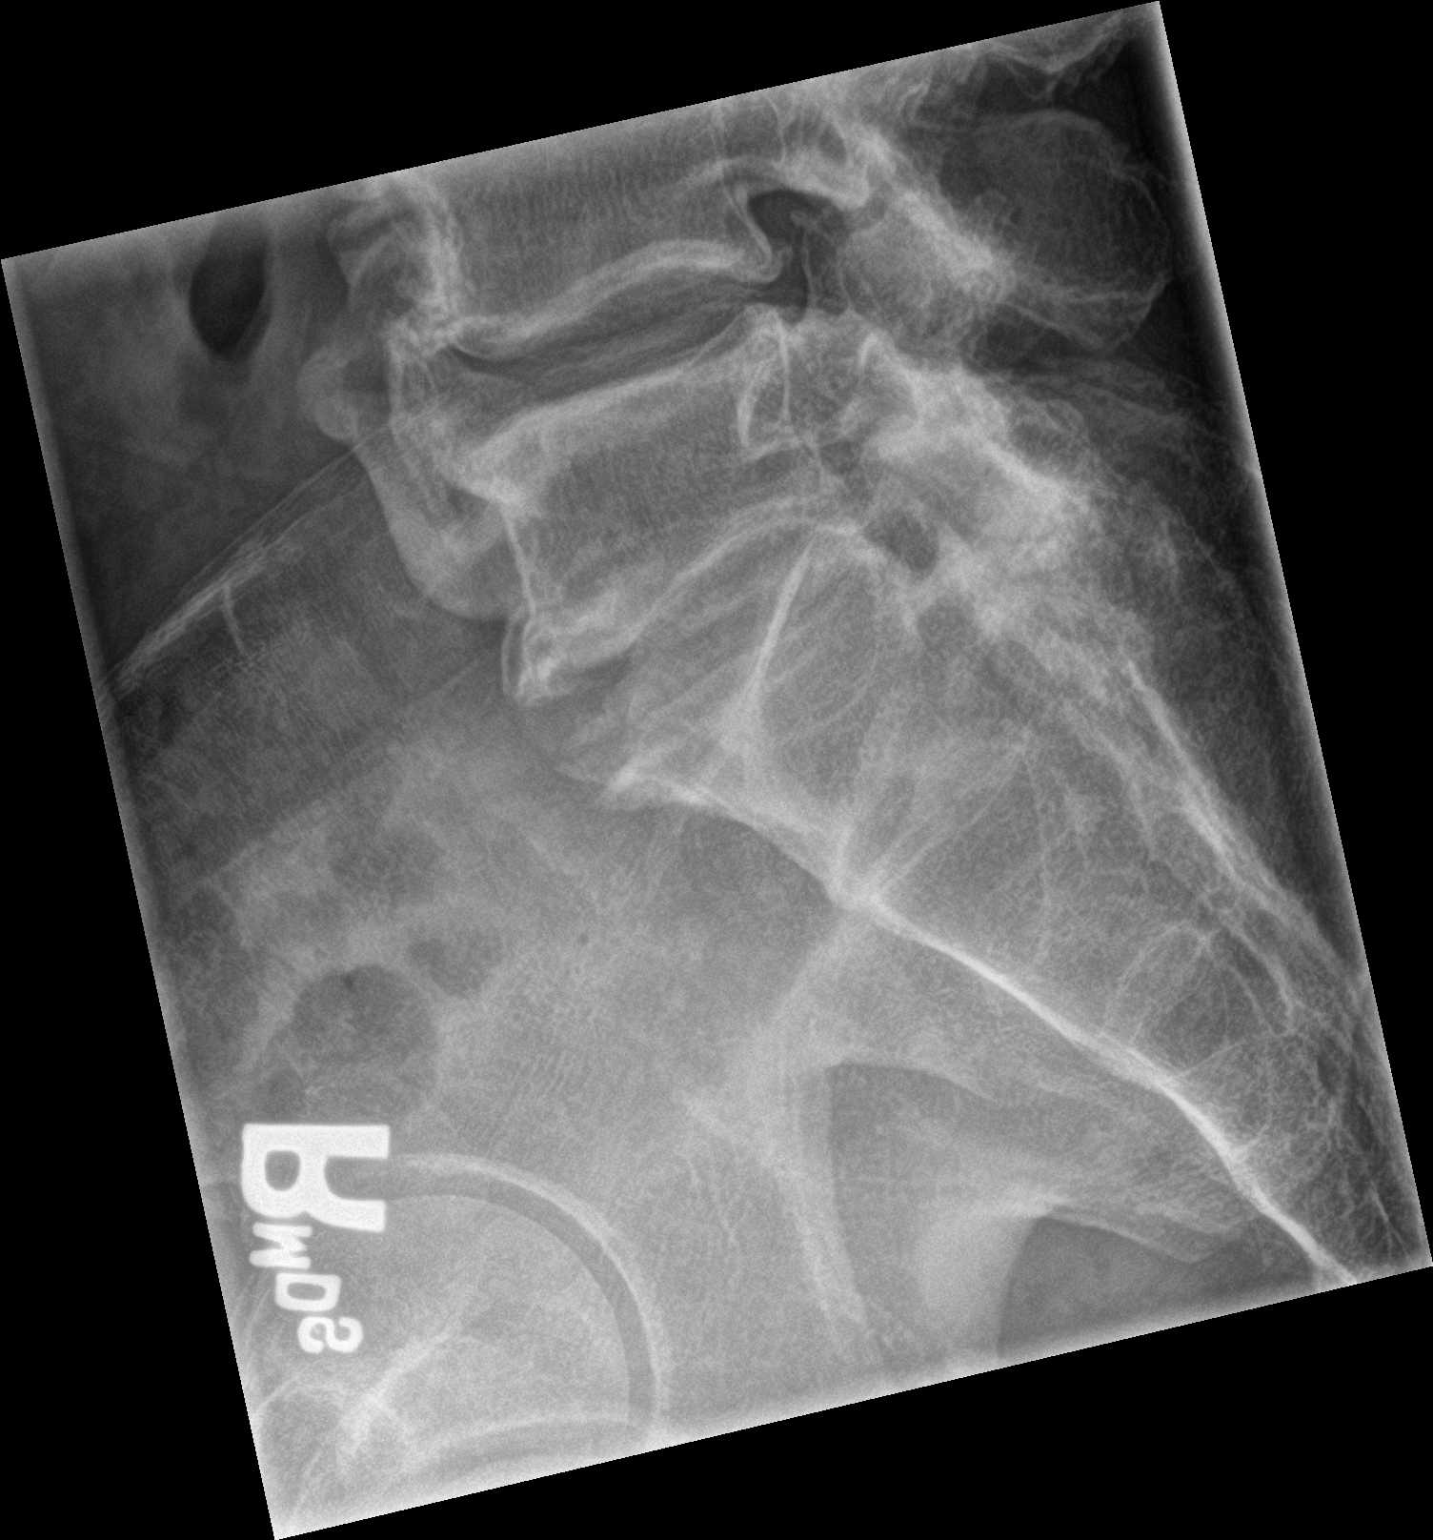

[3 of 3 positions shown; findings below may reference images not displayed]

FINDINGS: Normal alignment. Moderate degenerative changes throughout the
lumbar spine. Moderate facet hypertrophy in the LOWER lumbar levels.
There is no acute fracture or subluxation. No lytic or blastic
lesions.
IMPRESSION: Moderate degenerative changes.  No evidence for acute  abnormality.

## 2021-07-05 IMAGING — US US ABDOMEN COMPLETE
1 series · 13 of 25 positions shown · non-contrast
Comparison: February 03, 2020.

CLINICAL DATA: Mid abdominal pain.

EXAM:
ABDOMEN ULTRASOUND COMPLETE

[Series 1: us abdomen complete · 13 of 171 slices shown]
[im 1/171]
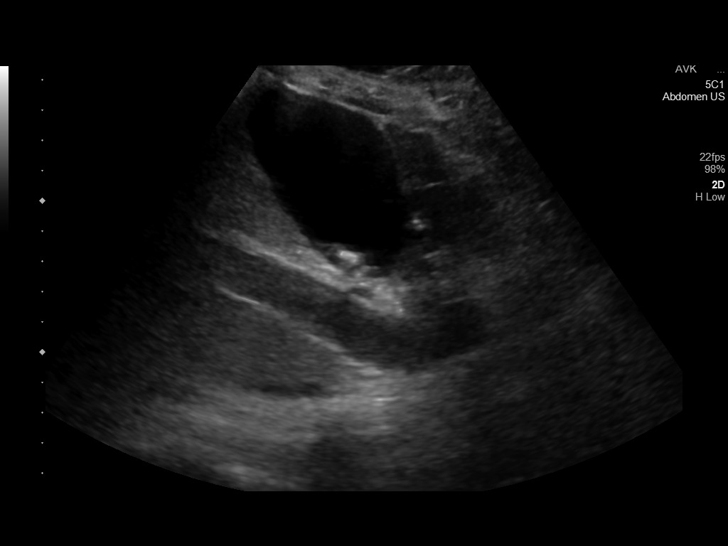
[im 15/171]
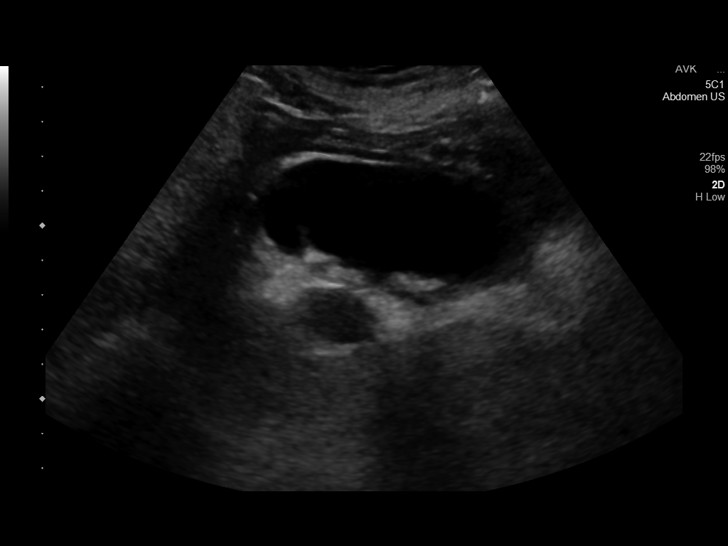
[im 29/171]
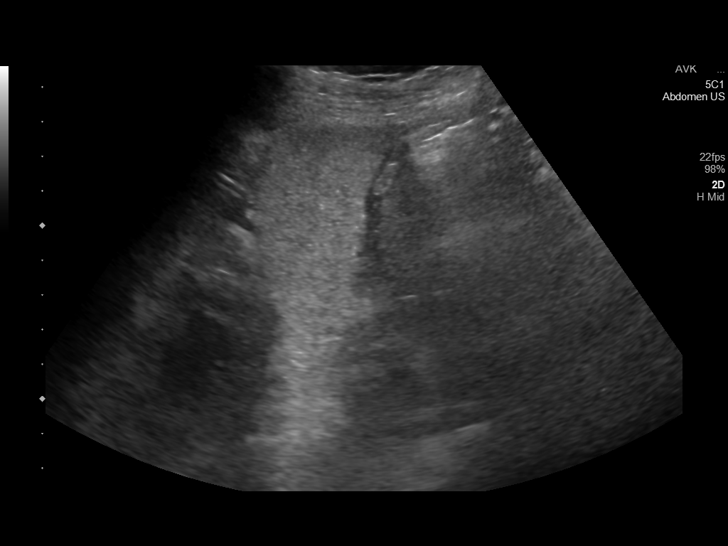
[im 43/171]
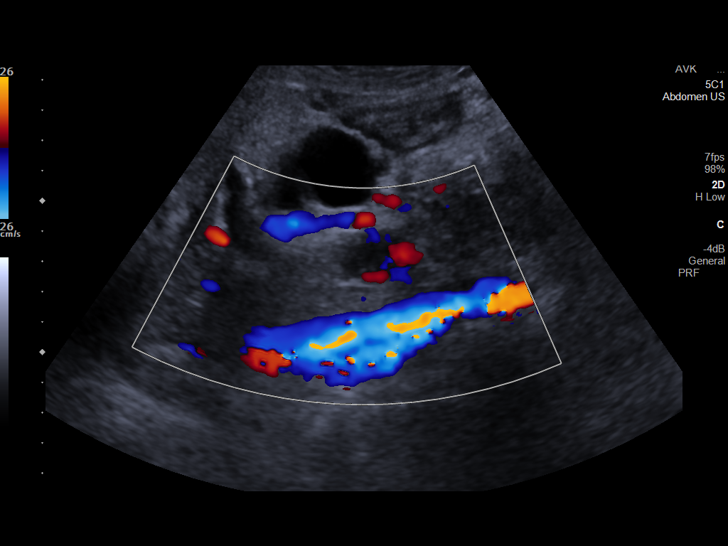
[im 57/171]
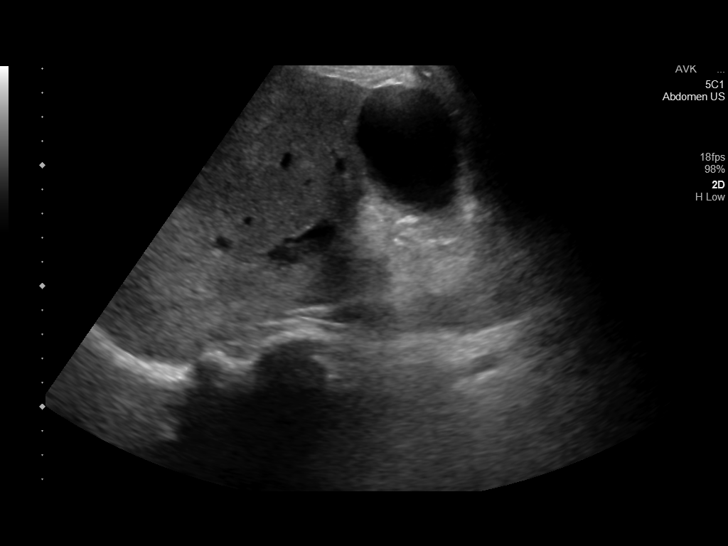
[im 71/171]
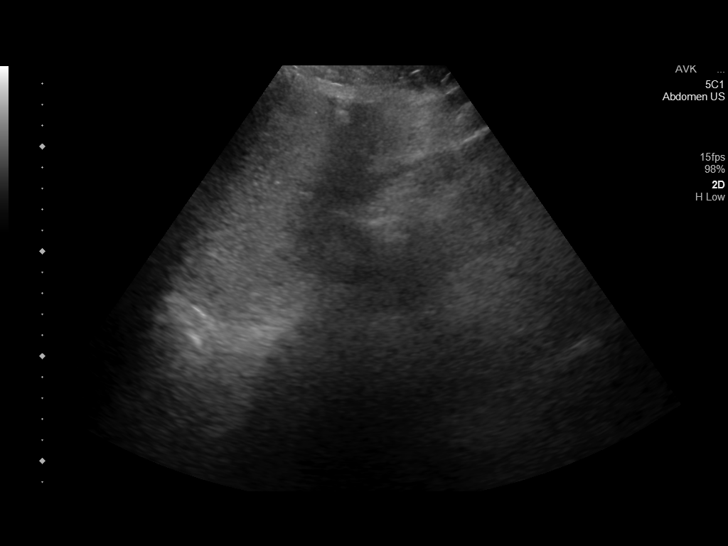
[im 86/171]
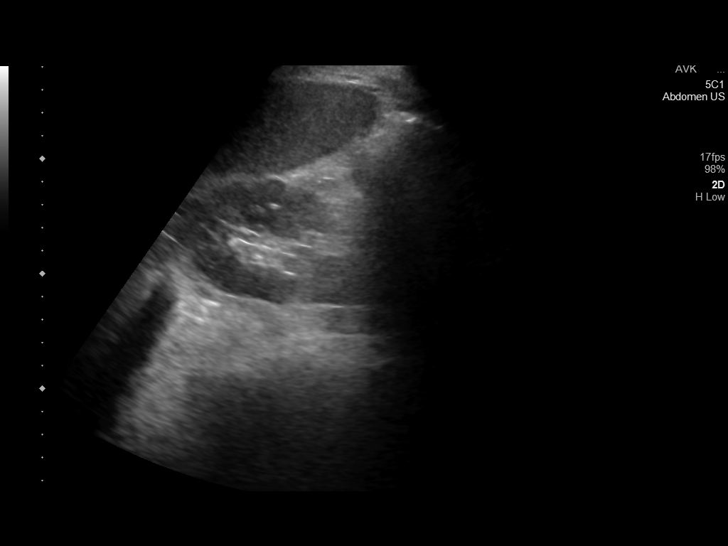
[im 100/171]
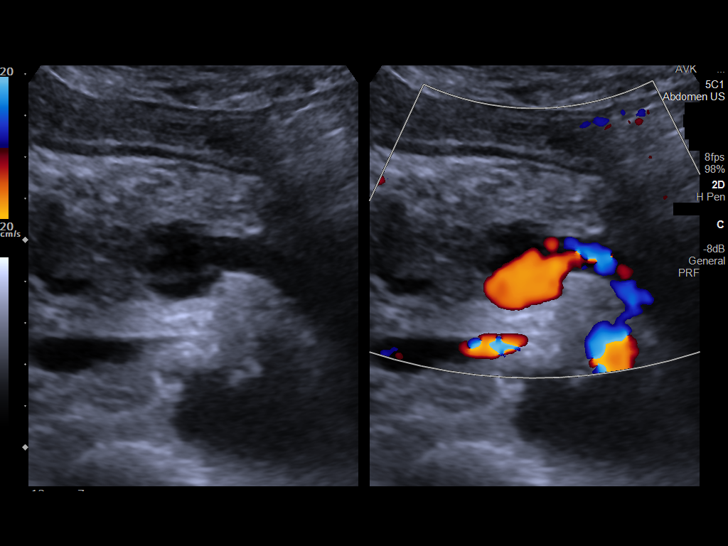
[im 114/171]
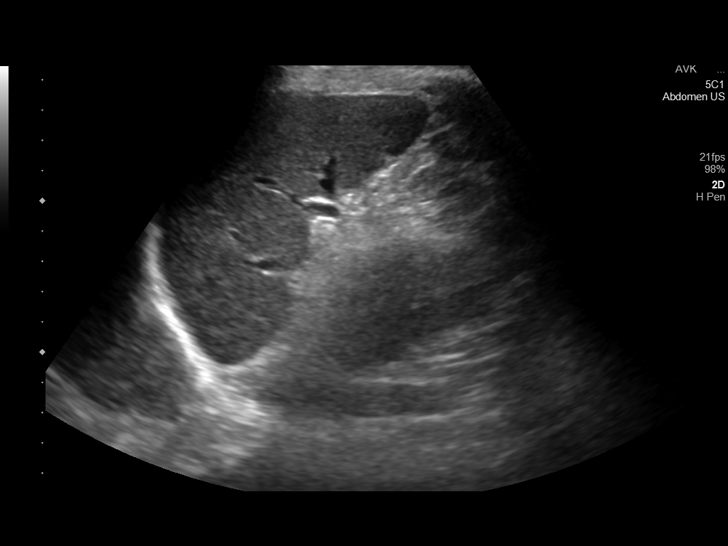
[im 128/171]
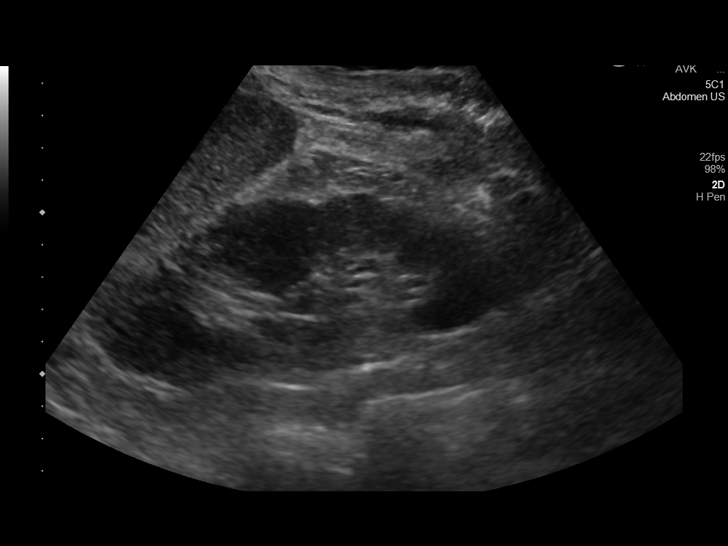
[im 142/171]
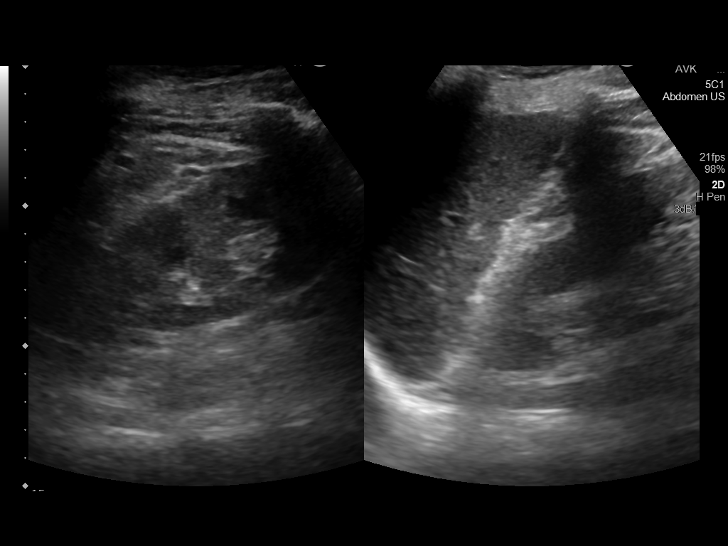
[im 156/171]
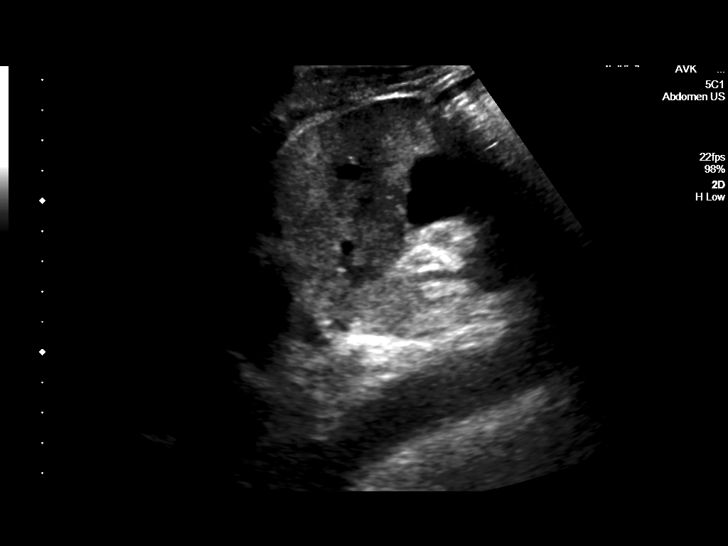
[im 171/171]
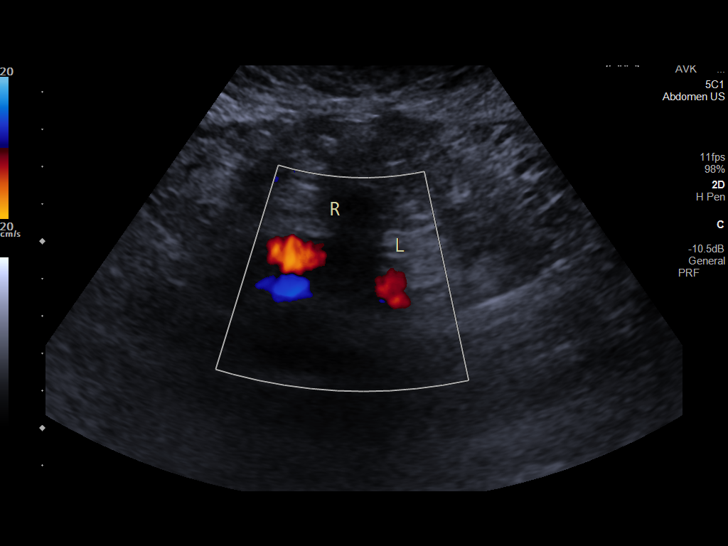

[13 of 25 positions shown; findings below may reference images not displayed]

FINDINGS: Gallbladder: Multiple shadowing echogenic gallstones are seen within
the gallbladder lumen. The largest measures approximately 12 mm.
There is no evidence of gallbladder wall thickening (2.3 mm). No
sonographic Murphy sign noted by sonographer.

Common bile duct: Diameter: Poorly visualized.

Liver: An 8.1 cm x 5.4 cm x 5.1 cm echogenic area is noted within
the liver. Diffusely increased echogenicity of the liver parenchyma
is noted. Nonocclusive thrombus is seen within the main portal vein
and extends to the right portal vein. This represents a new finding
when compared to the prior study. Normal direction of blood flow
towards the liver.

IVC: No abnormality visualized.

Pancreas: Visualized portion unremarkable.

Spleen: Size (8.3 cm) and appearance within normal limits.

Right Kidney: Length: 12.8 cm. Echogenicity within normal limits. No
mass or hydronephrosis visualized.

Left Kidney: Length: 10.8 cm. Echogenicity within normal limits. No
mass or hydronephrosis visualized.

Abdominal aorta: No aneurysm visualized (2.5 cm AP diameter).

Other findings: None.
IMPRESSION: 1. Nonocclusive thrombus within the main portal vein and right
portal vein, as described above.
2. Cholelithiasis.
3. Echogenic area within the right lobe of the liver which likely
represents an area of fatty infiltration.

## 2021-09-21 ENCOUNTER — Emergency Department: Payer: Medicare Other

## 2021-09-21 ENCOUNTER — Other Ambulatory Visit: Payer: Self-pay

## 2021-09-21 ENCOUNTER — Encounter: Payer: Self-pay | Admitting: Emergency Medicine

## 2021-09-21 ENCOUNTER — Emergency Department
Admission: EM | Admit: 2021-09-21 | Discharge: 2021-09-21 | Disposition: A | Discharge status: Home / Self Care | Payer: Medicare Other | Attending: Emergency Medicine | Admitting: Emergency Medicine

## 2021-09-21 DIAGNOSIS — G500 Trigeminal neuralgia: Secondary | ICD-10-CM | POA: Diagnosis not present

## 2021-09-21 DIAGNOSIS — I2510 Atherosclerotic heart disease of native coronary artery without angina pectoris: Secondary | ICD-10-CM | POA: Insufficient documentation

## 2021-09-21 DIAGNOSIS — Z8616 Personal history of COVID-19: Secondary | ICD-10-CM | POA: Insufficient documentation

## 2021-09-21 DIAGNOSIS — R600 Localized edema: Secondary | ICD-10-CM | POA: Insufficient documentation

## 2021-09-21 DIAGNOSIS — I82533 Chronic embolism and thrombosis of popliteal vein, bilateral: Secondary | ICD-10-CM | POA: Insufficient documentation

## 2021-09-21 DIAGNOSIS — M7989 Other specified soft tissue disorders: Secondary | ICD-10-CM | POA: Diagnosis present

## 2021-09-21 DIAGNOSIS — M792 Neuralgia and neuritis, unspecified: Secondary | ICD-10-CM

## 2021-09-21 LAB — CBC WITH DIFFERENTIAL/PLATELET
Abs Immature Granulocytes: 0.01 10*3/uL (ref 0.00–0.07)
Basophils Absolute: 0 10*3/uL (ref 0.0–0.1)
Basophils Relative: 1 %
Eosinophils Absolute: 0 10*3/uL (ref 0.0–0.5)
Eosinophils Relative: 1 %
HCT: 36.2 % — ABNORMAL LOW (ref 39.0–52.0)
Hemoglobin: 12 g/dL — ABNORMAL LOW (ref 13.0–17.0)
Immature Granulocytes: 0 %
Lymphocytes Relative: 16 %
Lymphs Abs: 1 10*3/uL (ref 0.7–4.0)
MCH: 29.8 pg (ref 26.0–34.0)
MCHC: 33.1 g/dL (ref 30.0–36.0)
MCV: 89.8 fL (ref 80.0–100.0)
Monocytes Absolute: 0.7 10*3/uL (ref 0.1–1.0)
Monocytes Relative: 11 %
Neutro Abs: 4.2 10*3/uL (ref 1.7–7.7)
Neutrophils Relative %: 71 %
Platelets: 144 10*3/uL — ABNORMAL LOW (ref 150–400)
RBC: 4.03 MIL/uL — ABNORMAL LOW (ref 4.22–5.81)
RDW: 16.3 % — ABNORMAL HIGH (ref 11.5–15.5)
WBC: 5.8 10*3/uL (ref 4.0–10.5)
nRBC: 0 % (ref 0.0–0.2)

## 2021-09-21 LAB — COMPREHENSIVE METABOLIC PANEL
ALT: 19 U/L (ref 0–44)
AST: 153 U/L — ABNORMAL HIGH (ref 15–41)
Albumin: 3 g/dL — ABNORMAL LOW (ref 3.5–5.0)
Alkaline Phosphatase: 297 U/L — ABNORMAL HIGH (ref 38–126)
Anion gap: 6 (ref 5–15)
BUN: <5 mg/dL — ABNORMAL LOW (ref 8–23)
CO2: 23 mmol/L (ref 22–32)
Calcium: 8.7 mg/dL — ABNORMAL LOW (ref 8.9–10.3)
Chloride: 108 mmol/L (ref 98–111)
Creatinine, Ser: 0.65 mg/dL (ref 0.61–1.24)
GFR, Estimated: >60 mL/min (ref >60)
Glucose, Bld: 114 mg/dL — ABNORMAL HIGH (ref 70–99)
Potassium: 3.2 mmol/L — ABNORMAL LOW (ref 3.5–5.1)
Sodium: 137 mmol/L (ref 135–145)
Total Bilirubin: 2 mg/dL — ABNORMAL HIGH (ref 0.3–1.2)
Total Protein: 7.5 g/dL (ref 6.5–8.1)

## 2021-09-21 LAB — CK: Total CK: 86 U/L (ref 49–397)

## 2021-09-21 LAB — BRAIN NATRIURETIC PEPTIDE: B Natriuretic Peptide: 79.2 pg/mL (ref 0.0–100.0)

## 2021-09-21 MED ORDER — OXYCODONE HCL 5 MG PO TABS
5.0000 mg | ORAL_TABLET | Freq: Once | ORAL | Status: AC
  Administered 2021-09-21: 5 mg via ORAL
  Filled 2021-09-21: qty 1

## 2021-09-21 MED ORDER — IBUPROFEN 400 MG PO TABS: 400.0000 mg | ORAL_TABLET | Freq: Once | ORAL | Status: DC

## 2021-09-21 MED ORDER — PREGABALIN 75 MG PO CAPS: 75.0000 mg | ORAL_CAPSULE | Freq: Three times a day (TID) | ORAL | 0 refills | Status: DC

## 2021-09-21 NOTE — Discharge Instructions (Addendum)
You can take your Eliquis 5 mg twice daily to help prevent worsening blood clots and prevent stroke.  I prescribed a short course of the Lyrica that you are getting previously but you need to talk to your primary care doctor and have them refer you to a pain clinic if you are continuing to have pain in your legs.  Return to the ER if you develop worsening symptoms or any other concerns  Calcified nonocclusive mural thrombus in bilateral proximal  popliteal veins of small volume not causing significant luminal  stenosis or flow limitation. No evidence of acute DVT.

## 2021-09-21 NOTE — ED Triage Notes (Signed)
Pt to ED via POV stating that for the last 3 weeks he has been having pain in both legs. Pt states that the pain started off as cramping and is now a stabbing pain in both legs. Pt states that he has swelling in both legs as well. Pt is currently in NAD.

## 2021-09-21 NOTE — ED Provider Notes (Signed)
[facility] Provider Note    Event Date/Time   First MD Initiated Contact with Patient 09/21/21 1038     (approximate)   History   Leg Pain   HPI  Thomas Coffey is a 66 y.o. male with a history of alcoholic pancreatitis, coronary disease, paroxysmal A-fib, coronary disease status post MI, COVID-19 who comes in with concerns for leg swelling.  Patient reports having Eliquis at home but has not been taking it.  Patient reports 3 weeks of increasing pain in bilateral legs with some stabbing sensations.  He has no weakness or numbness, bladder or bowel dysfunction.  No abdominal pain chest pain or shortness of breath.  He does report history of alcohol is not but does report cutting down on drinking and denies any recent issues with it.  He reports having Lyrica at home that was prescribed to him back in January taking this for the pain and it somewhat helps but has run out of it therefore he came to the ER to be evaluated.  He denies any falls or other concerns.  He reports using ibuprofen at home.    Physical Exam   Triage Vital Signs: ED Triage Vitals  Enc Vitals Group     BP 09/21/21 0910 (!) 132/95     Pulse Rate 09/21/21 0910 94     Resp 09/21/21 0910 16     Temp 09/21/21 0910 98.2 F (36.8 C)     Temp src --      SpO2 09/21/21 0910 100 %     Weight 09/21/21 0911 150 lb (68 kg)     Height 09/21/21 0911 5' 9"  (1.753 m)     Head Circumference --      Peak Flow --      Pain Score 09/21/21 0910 7     Pain Loc --      Pain Edu? --      Excl. in [place]? --     Most recent vital signs: Vitals:   09/21/21 0910  BP: (!) 132/95  Pulse: 94  Resp: 16  Temp: 98.2 F (36.8 C)  SpO2: 100%     General: Awake, no distress.  CV:  Good peripheral perfusion.  Resp:  Normal effort.  Abd:  No distention.  Soft and nontender Other:  Patient reports stabbing pain on the bilateral tib-fib area.  There is no warmth or redness noted.  No evidence of trauma.   He is got good 2+ distal pulses bilaterally.  He is some trace edema bilaterally but no significant swelling or calf tenderness.   ED Results / Procedures / Treatments   Labs (all labs ordered are listed, but only abnormal results are displayed) Labs Reviewed  CBC WITH DIFFERENTIAL/PLATELET - Abnormal; Notable for the following components:      Result Value   RBC 4.03 (*)    Hemoglobin 12.0 (*)    HCT 36.2 (*)    RDW 16.3 (*)    Platelets 144 (*)    All other components within normal limits  COMPREHENSIVE METABOLIC PANEL - Abnormal; Notable for the following components:   Potassium 3.2 (*)    Glucose, Bld 114 (*)    BUN <5 (*)    Calcium 8.7 (*)    Albumin 3.0 (*)    AST 153 (*)    Alkaline Phosphatase 297 (*)    Total Bilirubin 2.0 (*)    All other components within normal limits  CK  BRAIN  NATRIURETIC PEPTIDE  URINALYSIS, ROUTINE W REFLEX MICROSCOPIC      RADIOLOGY I have reviewed the xray personally and interpreted no evidence of fracture  PROCEDURES:  Critical Care performed: No  Procedures   MEDICATIONS ORDERED IN ED: Medications  oxyCODONE (Oxy IR/ROXICODONE) immediate release tablet 5 mg (5 mg Oral Given 09/21/21 1103)     IMPRESSION / MDM / ASSESSMENT AND PLAN / ED COURSE  I reviewed the triage vital signs and the nursing notes.   Patient's presentation is most consistent with acute presentation with potential threat to life or bodily function.   Differential includes rhabdo, nerve pain, DVT, fractures, cirrhosis, CHF.  He has some may be trace edema bilaterally but nothing that appears significant to suggest severe third spacing.  No evidence of infection based upon examination.  Good distal pulses bilaterally.  CBC shows stable hemoglobin.  White count is 6 around baseline.  His platelets are slightly low but around baseline.  CMP shows some elevation of AST alk phos and T. bili.  He has had this previously most likely secondary to his known alcohol  use.  I did alert him of this and the need to continue to cut down of alcohol use.  CK was normal BNP normal  Patient's x-rays were reassuring.  His ultrasound shows some chronic calcified thrombus but nonocclusive which is of some very small volume.  Patient is supposed to be on Eliquis for his history of atrial fibrillation.  It was listed in his last discharge note in December 2022.  I also found a note where he was on it for nonocclusive portal vein thrombus and he was supposed to be on it for that reason as well  Calcified nonocclusive mural thrombus in bilateral proximal  popliteal veins of small volume not causing significant luminal  stenosis or flow limitation. No evidence of acute DVT.    Had a lengthy conversation with patient about making sure that he is taking his Eliquis.  Patient reports that he has this medication at home.  We discussed the risk for bleeding that if he is having significant alcohol abuse or if he is having falls and hitting his head that this can increase the risk for bleeding and can lead to death but if he does have some of these old evidence of clots that not taking it can also lead to a risk of developing new clots that could cause issues.  He expressed understanding.  He declines a prescription today stating that he already has it at home.  We will represcribe patient's Lyrica given this did seem to help but discussed not overusing it and to follow-up with his primary care doctor for his pain to help adjust the medication further if this is not working  The patient is on the cardiac monitor to evaluate for evidence of arrhythmia and/or significant heart rate changes.      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  Vanessa [place], MD 09/21/21 [phone]

## 2021-09-21 NOTE — ED Notes (Signed)
Dc instructions and scripts reviewed with pt no questions or concerns at this time.  

## 2022-01-25 ENCOUNTER — Emergency Department: Payer: 59

## 2022-01-25 ENCOUNTER — Emergency Department
Admission: EM | Admit: 2022-01-25 | Discharge: 2022-01-25 | Disposition: A | Discharge status: Home / Self Care | Payer: 59 | Attending: Emergency Medicine | Admitting: Emergency Medicine

## 2022-01-25 ENCOUNTER — Encounter: Payer: Self-pay | Admitting: Emergency Medicine

## 2022-01-25 ENCOUNTER — Other Ambulatory Visit: Payer: Self-pay

## 2022-01-25 DIAGNOSIS — Z1152 Encounter for screening for COVID-19: Secondary | ICD-10-CM | POA: Diagnosis not present

## 2022-01-25 DIAGNOSIS — R0602 Shortness of breath: Secondary | ICD-10-CM | POA: Insufficient documentation

## 2022-01-25 DIAGNOSIS — R531 Weakness: Secondary | ICD-10-CM

## 2022-01-25 DIAGNOSIS — J40 Bronchitis, not specified as acute or chronic: Secondary | ICD-10-CM | POA: Diagnosis not present

## 2022-01-25 DIAGNOSIS — K2920 Alcoholic gastritis without bleeding: Secondary | ICD-10-CM | POA: Diagnosis not present

## 2022-01-25 DIAGNOSIS — R1013 Epigastric pain: Secondary | ICD-10-CM | POA: Diagnosis present

## 2022-01-25 DIAGNOSIS — F1011 Alcohol abuse, in remission: Secondary | ICD-10-CM | POA: Diagnosis not present

## 2022-01-25 DIAGNOSIS — K861 Other chronic pancreatitis: Secondary | ICD-10-CM | POA: Insufficient documentation

## 2022-01-25 LAB — COMPREHENSIVE METABOLIC PANEL
ALT: 14 U/L (ref 0–44)
AST: 68 U/L — ABNORMAL HIGH (ref 15–41)
Albumin: 2.9 g/dL — ABNORMAL LOW (ref 3.5–5.0)
Alkaline Phosphatase: 218 U/L — ABNORMAL HIGH (ref 38–126)
Anion gap: 12 (ref 5–15)
BUN: 16 mg/dL (ref 8–23)
CO2: 16 mmol/L — ABNORMAL LOW (ref 22–32)
Calcium: 8.9 mg/dL (ref 8.9–10.3)
Chloride: 111 mmol/L (ref 98–111)
Creatinine, Ser: 1.02 mg/dL (ref 0.61–1.24)
GFR, Estimated: >60 mL/min (ref >60)
Glucose, Bld: 147 mg/dL — ABNORMAL HIGH (ref 70–99)
Potassium: 4.5 mmol/L (ref 3.5–5.1)
Sodium: 139 mmol/L (ref 135–145)
Total Bilirubin: 3.2 mg/dL — ABNORMAL HIGH (ref 0.3–1.2)
Total Protein: 8.3 g/dL — ABNORMAL HIGH (ref 6.5–8.1)

## 2022-01-25 LAB — CBC
HCT: 40.1 % (ref 39.0–52.0)
Hemoglobin: 12.7 g/dL — ABNORMAL LOW (ref 13.0–17.0)
MCH: 29.1 pg (ref 26.0–34.0)
MCHC: 31.7 g/dL (ref 30.0–36.0)
MCV: 91.8 fL (ref 80.0–100.0)
Platelets: 214 10*3/uL (ref 150–400)
RBC: 4.37 MIL/uL (ref 4.22–5.81)
RDW: 17 % — ABNORMAL HIGH (ref 11.5–15.5)
WBC: 13.2 10*3/uL — ABNORMAL HIGH (ref 4.0–10.5)
nRBC: 0 % (ref 0.0–0.2)

## 2022-01-25 LAB — RESP PANEL BY RT-PCR (RSV, FLU A&B, COVID)  RVPGX2
Influenza A by PCR: NEGATIVE
Influenza B by PCR: NEGATIVE
Resp Syncytial Virus by PCR: NEGATIVE
SARS Coronavirus 2 by RT PCR: NEGATIVE

## 2022-01-25 LAB — TROPONIN I (HIGH SENSITIVITY)
Troponin I (High Sensitivity): 5 ng/L (ref <18)
Troponin I (High Sensitivity): 4 ng/L (ref <18)

## 2022-01-25 LAB — ETHANOL: Alcohol, Ethyl (B): 20 mg/dL — ABNORMAL HIGH (ref <10)

## 2022-01-25 MED ORDER — LACTATED RINGERS IV BOLUS
1000.0000 mL | Freq: Once | INTRAVENOUS | Status: AC
  Administered 2022-01-25: 1000 mL via INTRAVENOUS

## 2022-01-25 MED ORDER — ACETAMINOPHEN 500 MG PO TABS
1000.0000 mg | ORAL_TABLET | Freq: Once | ORAL | Status: AC
  Administered 2022-01-25: 1000 mg via ORAL
  Filled 2022-01-25: qty 2

## 2022-01-25 MED ORDER — BENZONATATE 100 MG PO CAPS: 100.0000 mg | ORAL_CAPSULE | Freq: Three times a day (TID) | ORAL | 0 refills | Status: DC | PRN

## 2022-01-25 MED ORDER — IOHEXOL 300 MG/ML  SOLN: 100.0000 mL | Freq: Once | INTRAMUSCULAR | Status: DC | PRN

## 2022-01-25 MED ORDER — IOHEXOL 350 MG/ML SOLN
75.0000 mL | Freq: Once | INTRAVENOUS | Status: AC | PRN
  Administered 2022-01-25: 75 mL via INTRAVENOUS

## 2022-01-25 MED ORDER — IPRATROPIUM-ALBUTEROL 0.5-2.5 (3) MG/3ML IN SOLN
3.0000 mL | Freq: Once | RESPIRATORY_TRACT | Status: AC
  Administered 2022-01-25: 3 mL via RESPIRATORY_TRACT
  Filled 2022-01-25: qty 3

## 2022-01-25 MED ORDER — OXYCODONE HCL 5 MG PO TABS
5.0000 mg | ORAL_TABLET | Freq: Once | ORAL | Status: AC
  Administered 2022-01-25: 5 mg via ORAL
  Filled 2022-01-25: qty 1

## 2022-01-25 MED ORDER — SODIUM CHLORIDE 0.9 % IV BOLUS
1000.0000 mL | Freq: Once | INTRAVENOUS | Status: AC
  Administered 2022-01-25: 1000 mL via INTRAVENOUS

## 2022-01-25 NOTE — Discharge Instructions (Addendum)
Take acetaminophen 650 mg every 6 hours for pain.  Take with food.  Thank you for choosing [place] for your health care today!  Please see your primary doctor this week for a follow up appointment.   Sometimes, in the early stages of certain disease courses it is difficult to detect in the emergency department evaluation -- so, it is important that you continue to monitor your symptoms and call your doctor right away or return to the emergency department if you develop any new or worsening symptoms.  Please go to the following website to schedule new (and existing) patient appointments:   [email or web address]  If you do not have a primary doctor try calling the following clinics to establish care:  If you have insurance:  [facility] [phone] [place] [address]   [facility]  [phone] [place] [place] [address]   If you do not have insurance:  [facility]  [phone] [address], [place] [address]   The following is another list of primary care offices in the area who are accepting new patients at this time.  Please reach out to one of them directly and let them know you would like to schedule an appointment to follow up on an Emergency Department visit, and/or to establish a new primary care provider (PCP).  There are likely other primary care clinics in the are who are accepting new patients, but this is an excellent place to start:  [place] physician: Dr Lavon Paganini [address] [place], [place] [address] [phone]  [facility] Lead Physician: Dr Steele Sizer [address], [place], [place] [address] [phone]  [place] Physician: Dr Park Liter [address] [place], [place] [address] [phone]  [facility] Lead Physician: Dr Dewaine Oats [place], [place], [place] [address] [phone]  [place] [facility] [place] Physician: Dr Halina Maidens [address] [place],  [address] [phone]   It was my pleasure to care for you today.   [place] Brunette Jacelyn Grip, MD

## 2022-01-25 NOTE — ED Provider Notes (Signed)
[facility] Provider Note    Event Date/Time   First MD Initiated Contact with Patient 01/25/22 1312     (approximate)   History   Chest Pain and Shortness of Breath   HPI  Thomas Coffey is a 67 y.o. male  here with CP, SOB, weakness. Pt reports that for the past week he has had progressively worsening cough, weakness, and loss of appetite. States he has lost >10 lb in past few weeks. Reports that over the past few days he has also developed a harsh, severe cough with SOB. He now feels SOB and weak even walking across the room. No fevers, chills. No known sick contacts. He has had some intermittent epigastric discomfort.        Physical Exam   Triage Vital Signs: ED Triage Vitals  Enc Vitals Group     BP 01/25/22 1214 111/75     Pulse Rate 01/25/22 1214 (!) 121     Resp 01/25/22 1214 18     Temp --      Temp src --      SpO2 01/25/22 1214 98 %     Weight 01/25/22 1209 110 lb (49.9 kg)     Height 01/25/22 1209 5\' 9"  (1.753 m)     Head Circumference --      Peak Flow --      Pain Score 01/25/22 1209 8     Pain Loc --      Pain Edu? --      Excl. in [place]? --     Most recent vital signs: Vitals:   01/25/22 1722 01/25/22 1730  BP:  114/73  Pulse: (!) 106 (!) 111  Resp:    SpO2: 97% 98%     General: Awake, no distress.  CV:  Good peripheral perfusion. RRR. No murmurs, rubs, gallops. Resp:  Normal effort. Lungs with diffuse expiratory wheezes, course breath sounds. Abd:  No distention. No tenderness, rebound, or guarding. Other:  No LE edema.   ED Results / Procedures / Treatments   Labs (all labs ordered are listed, but only abnormal results are displayed) Labs Reviewed  CBC - Abnormal; Notable for the following components:      Result Value   WBC 13.2 (*)    Hemoglobin 12.7 (*)    RDW 17.0 (*)    All other components within normal limits  COMPREHENSIVE METABOLIC PANEL - Abnormal; Notable for the following components:   CO2 16  (*)    Glucose, Bld 147 (*)    Total Protein 8.3 (*)    Albumin 2.9 (*)    AST 68 (*)    Alkaline Phosphatase 218 (*)    Total Bilirubin 3.2 (*)    All other components within normal limits  ETHANOL - Abnormal; Notable for the following components:   Alcohol, Ethyl (B) 20 (*)    All other components within normal limits  RESP PANEL BY RT-PCR (RSV, FLU A&B, COVID)  RVPGX2  TROPONIN I (HIGH SENSITIVITY)  TROPONIN I (HIGH SENSITIVITY)     EKG Sinus tachycardia, VR 121. PR 112, QRS 90, QTc 499. No acute St elevations. Non-specific St changes.   RADIOLOGY CXR: No active disease   I also independently reviewed and agree with radiologist interpretations.   PROCEDURES:  Critical Care performed: No   MEDICATIONS ORDERED IN ED: Medications  ipratropium-albuterol (DUONEB) 0.5-2.5 (3) MG/3ML nebulizer solution 3 mL (3 mLs Nebulization Given 01/25/22 1413)  lactated ringers bolus 1,000  mL (0 mLs Intravenous Stopped 01/25/22 1542)  iohexol (OMNIPAQUE) 350 MG/ML injection 75 mL (75 mLs Intravenous Contrast Given 01/25/22 1553)  sodium chloride 0.9 % bolus 1,000 mL (0 mLs Intravenous Stopped 01/25/22 1657)  oxyCODONE (Oxy IR/ROXICODONE) immediate release tablet 5 mg (5 mg Oral Given 01/25/22 1727)  acetaminophen (TYLENOL) tablet 1,000 mg (1,000 mg Oral Given 01/25/22 1727)     IMPRESSION / MDM / ASSESSMENT AND PLAN / ED COURSE  I reviewed the triage vital signs and the nursing notes.                              Differential diagnosis includes, but is not limited to, COPD, URI, PNA, intra-abd pathology such as cholecystitis, gastritis, pancreatitis, ACS, PE  Patient's presentation is most consistent with acute presentation with potential threat to life or bodily function.  The patient is on the cardiac monitor to evaluate for evidence of arrhythmia and/or significant heart rate changes  67 yo M with PMHx alcoholism, pAFib, here with cough, SOB, abd pain, nausea. Suspect possible  bronchitis vs PNA vs aspiration, possibly with component of alcoholic gastritis/pancreatitis given his abd pain and nausea. Labs show likely dehydration, IVF given. Will send for CT Chest A/P given his chest and abdominal pain, tachycardia.  FINAL CLINICAL IMPRESSION(S) / ED DIAGNOSES   Final diagnoses:  Bronchitis  Epigastric pain  Generalized weakness  Chronic pancreatitis, unspecified pancreatitis type ([place])     Rx / DC Orders   ED Discharge Orders          Ordered    benzonatate (TESSALON PERLES) 100 MG capsule  3 times daily PRN        01/25/22 1738             Note:  This document was prepared using Dragon voice recognition software and may include unintentional dictation errors.   Duffy Bruce, MD 01/25/22 2151

## 2022-01-25 NOTE — ED Triage Notes (Addendum)
Pt states that he hasn't been feeling well since thanksgiving, pt states that he thought it was the flu originally, pt states that he cont to have chest pain and sob and reports that his arms are weak. Pt reports that he has lost 30 lb since thanksgiving. Pt has a pepsi bottle with him that has a different liquid in it, pt states that it is theraflu, pt is asked not to drink any while he is waiting

## 2022-01-25 NOTE — ED Provider Triage Note (Signed)
Emergency Medicine Provider Triage Evaluation Note  Thomas Coffey , a 67 y.o. male  was evaluated in triage.  Pt complains of Chest pain, coughing, SOB and 30 pound weight loss since Thanksgiving. Reports feeling weak. Thought it was the flu.  Patient has a pepsi bottle with him and got very defensive about it and voiced he would leave if it was taken away from him.    Review of Systems  Positive: Smoker Negative:   Physical Exam  Ht 5\' 9"  (1.753 m)   Wt 49.9 kg   BMI 16.24 kg/m  Gen:   Awake, no distress   Resp:  Normal effort  MSK:   Moves extremities without difficulty  Other:    Medical Decision Making  Medically screening exam initiated at 12:14 PM.  Appropriate orders placed.  Danie Chandler was informed that the remainder of the evaluation will be completed by another provider, this initial triage assessment does not replace that evaluation, and the importance of remaining in the ED until their evaluation is complete.     Johnn Hai, PA-C 01/25/22 1222

## 2022-01-25 NOTE — ED Provider Notes (Signed)
I reassessed this patient who is feeling remarkably better with some fluids and treatment received earlier in the ED visit.  Ambulatory with steady gait to use the bathroom.  No acute medical complaints at this time, reviewed labs and imaging which show evidence of chronic pancreatitis, troponins flat.  CT angiogram shows no signs of obvious pneumonia.  Plan for discharge at this time, patient agreeable, understands to return with any progression of symptoms or worsening.  PMD follow-up   Lucillie Garfinkel, MD 01/25/22 [phone]

## 2022-01-25 NOTE — ED Notes (Signed)
Patient transported to CT 

## 2022-01-25 NOTE — ED Notes (Signed)
Patient refused to take discharge paperwork.  He only wanted the prescription.  He said he did have room in his pockets for that much paper, and he was just going to throw it away.

## 2022-03-19 ENCOUNTER — Emergency Department
Admission: EM | Admit: 2022-03-19 | Discharge: 2022-03-20 | Disposition: A | Discharge status: Home / Self Care | Payer: 59 | Attending: Emergency Medicine | Admitting: Emergency Medicine

## 2022-03-19 ENCOUNTER — Emergency Department: Payer: 59

## 2022-03-19 ENCOUNTER — Other Ambulatory Visit: Payer: Self-pay

## 2022-03-19 DIAGNOSIS — E876 Hypokalemia: Secondary | ICD-10-CM | POA: Diagnosis not present

## 2022-03-19 DIAGNOSIS — R600 Localized edema: Secondary | ICD-10-CM | POA: Diagnosis not present

## 2022-03-19 DIAGNOSIS — I2510 Atherosclerotic heart disease of native coronary artery without angina pectoris: Secondary | ICD-10-CM | POA: Insufficient documentation

## 2022-03-19 DIAGNOSIS — R079 Chest pain, unspecified: Secondary | ICD-10-CM | POA: Insufficient documentation

## 2022-03-19 DIAGNOSIS — Z1152 Encounter for screening for COVID-19: Secondary | ICD-10-CM | POA: Diagnosis not present

## 2022-03-19 LAB — CBC
HCT: 29.5 % — ABNORMAL LOW (ref 39.0–52.0)
Hemoglobin: 9.7 g/dL — ABNORMAL LOW (ref 13.0–17.0)
MCH: 32.8 pg (ref 26.0–34.0)
MCHC: 32.9 g/dL (ref 30.0–36.0)
MCV: 99.7 fL (ref 80.0–100.0)
Platelets: 193 10*3/uL (ref 150–400)
RBC: 2.96 MIL/uL — ABNORMAL LOW (ref 4.22–5.81)
RDW: 20.1 % — ABNORMAL HIGH (ref 11.5–15.5)
WBC: 9 10*3/uL (ref 4.0–10.5)
nRBC: 0 % (ref 0.0–0.2)

## 2022-03-19 LAB — BASIC METABOLIC PANEL
Anion gap: 9 (ref 5–15)
BUN: 7 mg/dL — ABNORMAL LOW (ref 8–23)
CO2: 19 mmol/L — ABNORMAL LOW (ref 22–32)
Calcium: 8.2 mg/dL — ABNORMAL LOW (ref 8.9–10.3)
Chloride: 109 mmol/L (ref 98–111)
Creatinine, Ser: 1.19 mg/dL (ref 0.61–1.24)
GFR, Estimated: >60 mL/min (ref >60)
Glucose, Bld: 140 mg/dL — ABNORMAL HIGH (ref 70–99)
Potassium: 2.7 mmol/L — CL (ref 3.5–5.1)
Sodium: 137 mmol/L (ref 135–145)

## 2022-03-19 LAB — RESP PANEL BY RT-PCR (RSV, FLU A&B, COVID)  RVPGX2
Influenza A by PCR: NEGATIVE
Influenza B by PCR: NEGATIVE
Resp Syncytial Virus by PCR: NEGATIVE
SARS Coronavirus 2 by RT PCR: NEGATIVE

## 2022-03-19 LAB — BRAIN NATRIURETIC PEPTIDE: B Natriuretic Peptide: 94.7 pg/mL (ref 0.0–100.0)

## 2022-03-19 LAB — TROPONIN I (HIGH SENSITIVITY)
Troponin I (High Sensitivity): 5 ng/L (ref <18)
Troponin I (High Sensitivity): 5 ng/L (ref <18)

## 2022-03-19 MED ORDER — POTASSIUM CHLORIDE 10 MEQ/100ML IV SOLN
10.0000 meq | Freq: Once | INTRAVENOUS | Status: AC
  Administered 2022-03-19: 10 meq via INTRAVENOUS
  Filled 2022-03-19: qty 100

## 2022-03-19 MED ORDER — OXYCODONE-ACETAMINOPHEN 5-325 MG PO TABS
1.0000 | ORAL_TABLET | Freq: Once | ORAL | Status: AC
  Administered 2022-03-19: 1 via ORAL
  Filled 2022-03-19: qty 1

## 2022-03-19 MED ORDER — POTASSIUM CHLORIDE CRYS ER 20 MEQ PO TBCR
40.0000 meq | EXTENDED_RELEASE_TABLET | Freq: Once | ORAL | Status: AC
  Administered 2022-03-19: 40 meq via ORAL
  Filled 2022-03-19: qty 2

## 2022-03-19 MED ORDER — POTASSIUM CHLORIDE 20 MEQ PO PACK: 20.0000 meq | PACK | Freq: Two times a day (BID) | ORAL | 0 refills | Status: DC

## 2022-03-19 MED ORDER — HYDROCODONE-ACETAMINOPHEN 5-325 MG PO TABS: 1.0000 | ORAL_TABLET | ORAL | 0 refills | Status: DC | PRN

## 2022-03-19 NOTE — ED Notes (Signed)
Lab contacted for add on BNP

## 2022-03-19 NOTE — ED Provider Notes (Signed)
[facility] Provider Note    Event Date/Time   First MD Initiated Contact with Patient 03/19/22 2141     (approximate)  History   Chief Complaint: Chest Pain (X3 days)  HPI  Thomas Coffey is a 67 y.o. male with a past medical history of prior MI, CAD, chronic anemia, presents to the emergency department with multiple complaints.  According to the patient he says for the past 3 to 4 days he has been experiencing some intermittent chest pain and shortness of breath he has had some swelling to his legs.  He also states months of decreased appetite and weight loss, as well as decreased energy.  Patient states he had a primary care doctor but they are refusing to see him per patient.  Patient states he was on potassium supplements but does not been able to take them since losing his primary care doctor.  Physical Exam   Triage Vital Signs: ED Triage Vitals [03/19/22 2008]  Enc Vitals Group     BP 114/63     Pulse Rate 100     Resp 16     Temp 98.7 F (37.1 C)     Temp Source Oral     SpO2 99 %     Weight 155 lb (70.3 kg)     Height '5\' 8"'$  (1.727 m)     Head Circumference      Peak Flow      Pain Score 10     Pain Loc      Pain Edu?      Excl. in [place]?     Most recent vital signs: Vitals:   03/19/22 2130 03/19/22 2140  BP: 104/66   Pulse:  95  Resp: 19 18  Temp:    SpO2:  99%    General: Awake, no distress.  CV:  Good peripheral perfusion.  Regular rate and rhythm  Resp:  Normal effort.  Equal breath sounds bilaterally.  Abd:  No distention.  Soft, nontender.  No rebound or guarding.    ED Results / Procedures / Treatments   EKG  EKG viewed and interpreted by myself shows sinus tachycardia 107 bpm with narrow QRS, left axis deviation, largely normal intervals besides slight QTc prolongation, nonspecific ST changes.  RADIOLOGY  I have reviewed and interpreted the chest x-ray images.  No consolidation seen on my evaluation. Radiology  has read the chest x-ray is negative   MEDICATIONS ORDERED IN ED: Medications  potassium chloride 10 mEq in 100 mL IVPB (has no administration in time range)  potassium chloride SA (KLOR-CON M) CR tablet 40 mEq (40 mEq Oral Given 03/19/22 2151)     IMPRESSION / MDM / ASSESSMENT AND PLAN / ED COURSE  I reviewed the triage vital signs and the nursing notes.  Patient's presentation is most consistent with acute presentation with potential threat to life or bodily function.  Patient presents emergency department for multiple complaints including intermittent chest pain over last few days some shortness of breath as well as lower extremity swelling.  Overall patient's workup is reassuring.  Does have mild 1+ pedal edema bilaterally.  Patient's chemistry has resulted showing hypokalemia for which the patient has a history.  We will dose IV as well as oral potassium likely discharge on potassium supplements.  We will IV hydrate.  Patient CBC shows chronic anemia but no concerning findings.  Normal white blood cell count.  Reassuringly patient's troponin is negative COVID flu and RSV  are negative as well.  We will refer to the [facility] heart failure clinic given his lower extremity edema.  We will place the patient on potassium supplements.  We will refer the patient to CHF clinic as well as a PCP at [facility] clinic.  Patient is complaining of pain throughout his legs which he states is due to the swelling.  We will discharge on a very short course of pain medication.  FINAL CLINICAL IMPRESSION(S) / ED DIAGNOSES   Chest pain Leg pain Peripheral edema  Rx / DC Orders   Klor-Con 20 mill equivalents twice daily x 7 days [facility] referral  Note:  This document was prepared using Dragon voice recognition software and may include unintentional dictation errors.   Harvest Dark, MD 03/19/22 2205

## 2022-03-19 NOTE — ED Notes (Signed)
This RN notified of the patients low K+ level; pt taken to the next available room.

## 2022-03-19 NOTE — ED Triage Notes (Signed)
Pt to ED from home for CP, SOB, increased swelling to lower extremities, SOB when walking. Pt has HX of CHF. Pt has increased swelling to his abdomen as well and has loss of energy and appetite. Pt is CAOx4 and in no acute distress at this time. Walked into ER and had Actuary place him in a wheel chair.

## 2022-03-19 NOTE — ED Notes (Signed)
Pt also states he has been out of his PO prescribed meds x 1 month.

## 2022-03-20 ENCOUNTER — Telehealth: Payer: Self-pay | Admitting: Family

## 2022-03-20 NOTE — Telephone Encounter (Signed)
Referral confirmed. Please call patient to schedule appointment with [facility].

## 2022-03-20 NOTE — Telephone Encounter (Signed)
Patient called in stating he was seen in ED and was told to call and make an appt to be seen. Patient is requesting to be seen today. I let patient know I would send a message to provider and once reviewed myself or nursing staff would call patient back    Please call and advise

## 2022-03-27 ENCOUNTER — Emergency Department: Payer: 59

## 2022-03-27 ENCOUNTER — Other Ambulatory Visit: Payer: Self-pay

## 2022-03-27 ENCOUNTER — Emergency Department
Admission: EM | Admit: 2022-03-27 | Discharge: 2022-03-27 | Disposition: A | Discharge status: Home / Self Care | Payer: 59 | Attending: Student in an Organized Health Care Education/Training Program | Admitting: Student in an Organized Health Care Education/Training Program

## 2022-03-27 DIAGNOSIS — R0602 Shortness of breath: Secondary | ICD-10-CM | POA: Diagnosis not present

## 2022-03-27 DIAGNOSIS — M7989 Other specified soft tissue disorders: Secondary | ICD-10-CM | POA: Diagnosis not present

## 2022-03-27 DIAGNOSIS — R600 Localized edema: Secondary | ICD-10-CM | POA: Insufficient documentation

## 2022-03-27 DIAGNOSIS — R609 Edema, unspecified: Secondary | ICD-10-CM

## 2022-03-27 LAB — COMPREHENSIVE METABOLIC PANEL
ALT: 22 U/L (ref 0–44)
AST: 100 U/L — ABNORMAL HIGH (ref 15–41)
Albumin: 2 g/dL — ABNORMAL LOW (ref 3.5–5.0)
Alkaline Phosphatase: 180 U/L — ABNORMAL HIGH (ref 38–126)
Anion gap: 8 (ref 5–15)
BUN: 6 mg/dL — ABNORMAL LOW (ref 8–23)
CO2: 17 mmol/L — ABNORMAL LOW (ref 22–32)
Calcium: 8.4 mg/dL — ABNORMAL LOW (ref 8.9–10.3)
Chloride: 109 mmol/L (ref 98–111)
Creatinine, Ser: 1.02 mg/dL (ref 0.61–1.24)
GFR, Estimated: >60 mL/min (ref >60)
Glucose, Bld: 164 mg/dL — ABNORMAL HIGH (ref 70–99)
Potassium: 4.1 mmol/L (ref 3.5–5.1)
Sodium: 134 mmol/L — ABNORMAL LOW (ref 135–145)
Total Bilirubin: 2 mg/dL — ABNORMAL HIGH (ref 0.3–1.2)
Total Protein: 7.5 g/dL (ref 6.5–8.1)

## 2022-03-27 LAB — CBC WITH DIFFERENTIAL/PLATELET
Abs Immature Granulocytes: 0.03 10*3/uL (ref 0.00–0.07)
Basophils Absolute: 0 10*3/uL (ref 0.0–0.1)
Basophils Relative: 0 %
Eosinophils Absolute: 0 10*3/uL (ref 0.0–0.5)
Eosinophils Relative: 0 %
HCT: 32.3 % — ABNORMAL LOW (ref 39.0–52.0)
Hemoglobin: 10.6 g/dL — ABNORMAL LOW (ref 13.0–17.0)
Immature Granulocytes: 0 %
Lymphocytes Relative: 13 %
Lymphs Abs: 1.2 10*3/uL (ref 0.7–4.0)
MCH: 32.9 pg (ref 26.0–34.0)
MCHC: 32.8 g/dL (ref 30.0–36.0)
MCV: 100.3 fL — ABNORMAL HIGH (ref 80.0–100.0)
Monocytes Absolute: 1 10*3/uL (ref 0.1–1.0)
Monocytes Relative: 10 %
Neutro Abs: 7 10*3/uL (ref 1.7–7.7)
Neutrophils Relative %: 77 %
Platelets: 161 10*3/uL (ref 150–400)
RBC: 3.22 MIL/uL — ABNORMAL LOW (ref 4.22–5.81)
RDW: 18.6 % — ABNORMAL HIGH (ref 11.5–15.5)
WBC: 9.3 10*3/uL (ref 4.0–10.5)
nRBC: 0 % (ref 0.0–0.2)

## 2022-03-27 LAB — URINALYSIS, ROUTINE W REFLEX MICROSCOPIC
Bilirubin Urine: NEGATIVE
Glucose, UA: NEGATIVE mg/dL
Hgb urine dipstick: NEGATIVE
Ketones, ur: NEGATIVE mg/dL
Leukocytes,Ua: NEGATIVE
Nitrite: NEGATIVE
Protein, ur: NEGATIVE mg/dL
Specific Gravity, Urine: 1.015 (ref 1.005–1.030)
pH: 5 (ref 5.0–8.0)

## 2022-03-27 LAB — BRAIN NATRIURETIC PEPTIDE: B Natriuretic Peptide: 152.3 pg/mL — ABNORMAL HIGH (ref 0.0–100.0)

## 2022-03-27 MED ORDER — FUROSEMIDE 10 MG/ML IJ SOLN: 40.0000 mg | Freq: Once | INTRAMUSCULAR | Status: DC

## 2022-03-27 MED ORDER — OXYCODONE-ACETAMINOPHEN 5-325 MG PO TABS
1.0000 | ORAL_TABLET | Freq: Once | ORAL | Status: AC
  Administered 2022-03-27: 1 via ORAL
  Filled 2022-03-27: qty 1

## 2022-03-27 MED ORDER — MELOXICAM 15 MG PO TABS: 15.0000 mg | ORAL_TABLET | Freq: Every day | ORAL | 0 refills | Status: DC

## 2022-03-27 MED ORDER — FUROSEMIDE 40 MG PO TABS
40.0000 mg | ORAL_TABLET | Freq: Once | ORAL | Status: AC
  Administered 2022-03-27: 40 mg via ORAL
  Filled 2022-03-27: qty 1

## 2022-03-27 MED ORDER — FUROSEMIDE 10 MG/ML IJ SOLN: 60.0000 mg | Freq: Once | INTRAMUSCULAR | Status: DC

## 2022-03-27 MED ORDER — HYDROCORTISONE 0.5 % EX CREA: 1.0000 | TOPICAL_CREAM | Freq: Two times a day (BID) | CUTANEOUS | 0 refills | Status: DC

## 2022-03-27 MED ORDER — FUROSEMIDE 40 MG PO TABS: 40.0000 mg | ORAL_TABLET | Freq: Every day | ORAL | 0 refills | Status: DC

## 2022-03-27 NOTE — ED Provider Notes (Signed)
[facility] Provider Note    Event Date/Time   First MD Initiated Contact with Patient 03/27/22 1255     (approximate)   History   Leg Swelling and Shortness of Breath   HPI  Thomas Coffey is a 67 y.o. male Thomas Coffey to the ER for evaluation of persistent leg swelling as well as shortness of breath with exertion.  He denies any pain.  No nausea or vomiting.  Is not on any diuretics.  Has quite an extensive history of alcohol abuse emphysema A-fib.     Physical Exam   Triage Vital Signs: ED Triage Vitals  Enc Vitals Group     BP 03/27/22 1222 114/66     Pulse Rate 03/27/22 1222 95     Resp 03/27/22 1222 18     Temp 03/27/22 1222 97.6 F (36.4 C)     Temp Source 03/27/22 1222 Oral     SpO2 03/27/22 1222 100 %     Weight 03/27/22 1230 154 lb 15.7 oz (70.3 kg)     Height 03/27/22 1230 '5\' 8"'$  (1.727 m)     Head Circumference --      Peak Flow --      Pain Score 03/27/22 1230 7     Pain Loc --      Pain Edu? --      Excl. in [place]? --     Most recent vital signs: Vitals:   03/27/22 1222  BP: 114/66  Pulse: 95  Resp: 18  Temp: 97.6 F (36.4 C)  SpO2: 100%     Constitutional: Alert  Eyes: Conjunctivae are normal.  Head: Atraumatic. Nose: No congestion/rhinnorhea. Mouth/Throat: Mucous membranes are moist.   Neck: Painless ROM.  Cardiovascular:   Good peripheral circulation. Respiratory: Normal respiratory effort.  No retractions.  Gastrointestinal: Soft and nontender.  Musculoskeletal:  no deformity, 2+ bilateral pitting lower extremity edema.  Well-perfused.  No cellulitic changes. Neurologic:  MAE spontaneously. No gross focal neurologic deficits are appreciated.  Skin:  Skin is warm, dry and intact. No rash noted. Psychiatric: Mood and affect are normal. Speech and behavior are normal.    ED Results / Procedures / Treatments   Labs (all labs ordered are listed, but only abnormal results are displayed) Labs Reviewed  CBC WITH  DIFFERENTIAL/PLATELET - Abnormal; Notable for the following components:      Result Value   RBC 3.22 (*)    Hemoglobin 10.6 (*)    HCT 32.3 (*)    MCV 100.3 (*)    RDW 18.6 (*)    All other components within normal limits  COMPREHENSIVE METABOLIC PANEL - Abnormal; Notable for the following components:   Sodium 134 (*)    CO2 17 (*)    Glucose, Bld 164 (*)    BUN 6 (*)    Calcium 8.4 (*)    Albumin 2.0 (*)    AST 100 (*)    Alkaline Phosphatase 180 (*)    Total Bilirubin 2.0 (*)    All other components within normal limits  BRAIN NATRIURETIC PEPTIDE - Abnormal; Notable for the following components:   B Natriuretic Peptide 152.3 (*)    All other components within normal limits  URINALYSIS, ROUTINE W REFLEX MICROSCOPIC     EKG  ED ECG REPORT I, Merlyn Lot, the attending physician, personally viewed and interpreted this ECG.   Date: 03/27/2022  EKG Time: 12:19  Rate: 95  Rhythm: sinus  Axis: left  Intervals: normal  qt  ST&T Change: no stemi, nonspecific st abn    RADIOLOGY Please see ED Course for my review and interpretation.  I personally reviewed all radiographic images ordered to evaluate for the above acute complaints and reviewed radiology reports and findings.  These findings were personally discussed with the patient.  Please see medical record for radiology report.    PROCEDURES:  Critical Care performed: No  Procedures   MEDICATIONS ORDERED IN ED: Medications  furosemide (LASIX) tablet 40 mg (40 mg Oral Given 03/27/22 1513)  oxyCODONE-acetaminophen (PERCOCET/ROXICET) 5-325 MG per tablet 1 tablet (1 tablet Oral Given 03/27/22 1513)     IMPRESSION / MDM / ASSESSMENT AND PLAN / ED COURSE  I reviewed the triage vital signs and the nursing notes.                              Differential diagnosis includes, but is not limited to, CHF, DVT, cellulitis, psoriasis, nephrotic syndrome, malnutrition, electrolyte abnormality  Patient presenting to  the ER for evaluation of symptoms as described above.  Based on symptoms, risk factors and considered above differential, this presenting complaint could reflect a potentially life-threatening illness therefore the patient will be placed on continuous pulse oximetry and telemetry for monitoring.  Laboratory evaluation will be sent to evaluate for the above complaints.      Clinical Course as of 03/27/22 1514  Mon Mar 27, 2022  1410 DG Chest Portable 1 View [PR]  1504 Think the patient does have some mild CHF has not been told that he has cirrhosis review of recent CT imaging did not have any findings of cirrhosis.  His LFTs appear to be at baseline.  He does admit that he is still drinking alcohol we discussed that a large component of his symptoms are likely related to poor nourishment in the setting of chronic alcohol abuse.  Given his swelling mildly elevated BNP will give dose of Lasix.  He is supposed to follow-up in heart failure clinic for further workup.  I do not see any indication for hospitalization at this time.  Patient awaiting results of lower extremity duplex just to make sure he does not have DVT as well as urinalysis to evaluate for proteinuria but anticipate regardless he will be appropriate for outpatient follow-up. [PR]    Clinical Course User Index [PR] Merlyn Lot, MD      FINAL CLINICAL IMPRESSION(S) / ED DIAGNOSES   Final diagnoses:  Peripheral edema     Rx / DC Orders   ED Discharge Orders          Ordered    furosemide (LASIX) 40 MG tablet  Daily        03/27/22 1503             Note:  This document was prepared using Dragon voice recognition software and may include unintentional dictation errors.    Merlyn Lot, MD 03/27/22 [phone]

## 2022-03-27 NOTE — ED Triage Notes (Signed)
Pt presents to the ED via POV for same complaints as 03/19/2022. Pt c/o CP, SOB, leg swelling to the legs, and SOB when walking. Pt NAD.

## 2022-03-31 ENCOUNTER — Encounter: Payer: Self-pay | Admitting: Family

## 2022-03-31 ENCOUNTER — Ambulatory Visit (HOSPITAL_BASED_OUTPATIENT_CLINIC_OR_DEPARTMENT_OTHER): Payer: 59 | Admitting: Family

## 2022-03-31 ENCOUNTER — Other Ambulatory Visit
Admission: RE | Admit: 2022-03-31 | Discharge: 2022-03-31 | Disposition: A | Discharge status: Home / Self Care | Payer: 59 | Source: Ambulatory Visit | Attending: Family | Admitting: Family

## 2022-03-31 VITALS — BP 96/65 | HR 82 | Wt 146.0 lb

## 2022-03-31 DIAGNOSIS — F1010 Alcohol abuse, uncomplicated: Secondary | ICD-10-CM

## 2022-03-31 DIAGNOSIS — I5032 Chronic diastolic (congestive) heart failure: Secondary | ICD-10-CM | POA: Insufficient documentation

## 2022-03-31 DIAGNOSIS — I480 Paroxysmal atrial fibrillation: Secondary | ICD-10-CM

## 2022-03-31 DIAGNOSIS — I10 Essential (primary) hypertension: Secondary | ICD-10-CM

## 2022-03-31 DIAGNOSIS — I2510 Atherosclerotic heart disease of native coronary artery without angina pectoris: Secondary | ICD-10-CM

## 2022-03-31 LAB — BASIC METABOLIC PANEL
Anion gap: 14 (ref 5–15)
BUN: 8 mg/dL (ref 8–23)
CO2: 18 mmol/L — ABNORMAL LOW (ref 22–32)
Calcium: 8.1 mg/dL — ABNORMAL LOW (ref 8.9–10.3)
Chloride: 103 mmol/L (ref 98–111)
Creatinine, Ser: 2.16 mg/dL — ABNORMAL HIGH (ref 0.61–1.24)
GFR, Estimated: 33 mL/min — ABNORMAL LOW (ref >60)
Glucose, Bld: 121 mg/dL — ABNORMAL HIGH (ref 70–99)
Potassium: 3.4 mmol/L — ABNORMAL LOW (ref 3.5–5.1)
Sodium: 135 mmol/L (ref 135–145)

## 2022-03-31 MED ORDER — SPIRONOLACTONE 25 MG PO TABS: 12.5000 mg | ORAL_TABLET | Freq: Every day | ORAL | 5 refills | Status: DC

## 2022-03-31 MED ORDER — FUROSEMIDE 40 MG PO TABS: 40.0000 mg | ORAL_TABLET | Freq: Every day | ORAL | 5 refills | Status: DC

## 2022-03-31 MED ORDER — POTASSIUM CHLORIDE CRYS ER 20 MEQ PO TBCR: 20.0000 meq | EXTENDED_RELEASE_TABLET | Freq: Every day | ORAL | 5 refills | Status: DC

## 2022-03-31 NOTE — Addendum Note (Signed)
Addended by: Jolaine Artist on: 03/31/2022 03:08 PM   Modules accepted: Level of Service

## 2022-03-31 NOTE — Patient Instructions (Signed)
Start taking lasix as 1 tablet daily   Start taking potassium as 1 tablet every day   Start taking spironolactone as 1/2 tablet every day    Wear compression socks daily.

## 2022-03-31 NOTE — Progress Notes (Cosign Needed Addendum)
[facility] HF CLINIC CONSULT NOTE   Patient ID: Thomas Coffey, male    DOB: 1955-02-14, 67 y.o.   MRN: [id number]  HPI  Thomas Coffey is a 67 y/o male with a history of CAD (MI 10/2018), pancreatitis, HTN, COPD, PAF, alcohol/ tobacco use and chronic heart failure.   Echo 11/12/18: EF 60-65% with mild LVH  Was in the ED 03/27/22 due to edema & SOB. Oral lasix given. Was in the ED 03/19/22 due to CP.   He presents today for his initial visit with a chief complaint of pedal edema and pain in his legs. Says that this started ~ 1 week ago. Says that leg pain feels like "ice picks sticking in me". Has fatigue, decreased appetite, SOB, palpitations, abdominal distention, light-headedness and difficulty sleeping. Denies chest pain, cough or chest tightness.   Was given 3 doses of lasix/ potassium from recent ED visit so took the last doses of those meds yesterday. Does says that he feels like the lasix helped and that his legs aren't as swollen as when he went to the ED. Says that he has a blood thinner that he took years ago but hasn't taken in a long time.   Smokes 1/2 ppd of cigarettes along with occasional marijuana. Drinks ~ 1/2 pint of gin and 1 beer daily and has been doing this for several years. 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   Past Medical History:  Diagnosis Date   Acute pancreatitis    Alcohol use    CHF (congestive heart failure) (HCC)    COPD (chronic obstructive pulmonary disease) (HCC)    Coronary artery disease    COVID-19 virus infection    positive COVID-19 test on 01/22/20   Hypertension    MI (myocardial infarction) ([place])    Paroxysmal atrial fibrillation [facility])    Past Surgical History:  Procedure Laterality Date   ANTERIOR VITRECTOMY Right 10/04/2020   Procedure: ANTERIOR VITRECTOMY;  Surgeon: Eulogio Bear, MD;  Location: [place];  Service: Ophthalmology;  Laterality: Right;   CATARACT  EXTRACTION W/PHACO Right 10/04/2020   Procedure: CATARACT EXTRACTION PHACO AND INTRAOCULAR LENS PLACEMENT (IOC) RIGHT VISION BLUE, CAPSULAR TENSION RING 14A  RIGHT 21.84 01:44.7;  Surgeon: Eulogio Bear, MD;  Location: [place];  Service: Ophthalmology;  Laterality: Right;   CATARACT EXTRACTION W/PHACO Left 10/18/2020   Procedure: CATARACT EXTRACTION PHACO AND INTRAOCULAR LENS PLACEMENT (IOC) LEFT 2.95 00:23.9;  Surgeon: Eulogio Bear, MD;  Location: [place];  Service: Ophthalmology;  Laterality: Left;   PARTIAL COLECTOMY     Family History  Problem Relation Age of Onset   Cancer Mother    Kidney disease Brother    Social History   Tobacco Use   Smoking status: Every Day    Packs/day: 1.00    Years: 30.00    Additional pack years: 0.00    Total pack years: 30.00    Types: Cigarettes   Smokeless tobacco: Never  Substance Use Topics   Alcohol use: Yes    Alcohol/week: 6.0 standard drinks of alcohol    Types: 6 Cans of beer per week    Comment: daily   No Known Allergies Prior to Admission medications   Medication Sig Start Date End Date Taking? Authorizing Provider  meloxicam (MOBIC) 15 MG tablet Take 1 tablet (15 mg total) by mouth daily. 03/27/22 04/26/22 Yes Naaman Plummer, MD  metoprolol tartrate (  LOPRESSOR) 25 MG tablet Take 0.5 tablets (12.5 mg total) by mouth 2 (two) times daily. Patient taking differently: Take 25 mg by mouth 2 (two) times daily. 12/30/20  Yes Nicole Kindred A, DO  amLODipine (NORVASC) 5 MG tablet Take 1 tablet (5 mg total) by mouth daily. 12/31/20   Ezekiel Slocumb, DO  furosemide (LASIX) 40 MG tablet Take 1 tablet (40 mg total) by mouth daily for 3 days. Patient not taking: Reported on 03/31/2022 03/27/22 03/30/22  Merlyn Lot, MD  HYDROcodone-acetaminophen (NORCO/VICODIN) 5-325 MG tablet Take 1 tablet by mouth every 4 (four) hours as needed. 03/19/22   Harvest Dark, MD  potassium chloride SA (KLOR-CON M) 20 MEQ  tablet Take 20 mEq by mouth daily. Patient not taking: Reported on 03/31/2022    [provider]   Review of Systems  Constitutional:  Positive for appetite change (decrease) and fatigue (easily).  HENT:  Negative for congestion, postnasal drip and sore throat.   Eyes: Negative.   Respiratory:  Positive for shortness of breath. Negative for cough and chest tightness.   Cardiovascular:  Positive for palpitations and leg swelling. Negative for chest pain.  Gastrointestinal:  Positive for abdominal distention. Negative for abdominal pain.  Endocrine: Negative.   Genitourinary: Negative.   Musculoskeletal:  Positive for arthralgias (leg pain).  Skin: Negative.   Allergic/Immunologic: Negative.   Neurological:  Positive for weakness and light-headedness.  Hematological:  Negative for adenopathy. Does not bruise/bleed easily.  Psychiatric/Behavioral:  Positive for sleep disturbance (due to leg pain). Negative for dysphoric mood. The patient is not nervous/anxious.    Vitals:   03/31/22 1035  BP: 96/65  Pulse: 82  SpO2: 95%  Weight: 146 lb (66.2 kg)   Wt Readings from Last 3 Encounters:  03/31/22 146 lb (66.2 kg)  03/27/22 154 lb 15.7 oz (70.3 kg)  03/19/22 155 lb (70.3 kg)   Lab Results  Component Value Date   CREATININE 2.16 (H) 03/31/2022   CREATININE 1.02 03/27/2022   CREATININE 1.19 03/19/2022   Physical Exam Vitals and nursing note reviewed.  Constitutional:      Appearance: Normal appearance.  HENT:     Head: Normocephalic and atraumatic.  Cardiovascular:     Rate and Rhythm: Normal rate and regular rhythm.  Pulmonary:     Effort: Pulmonary effort is normal.     Breath sounds: No wheezing or rales.  Abdominal:     General: There is no distension.     Palpations: Abdomen is soft.     Tenderness: There is no abdominal tenderness.  Musculoskeletal:        General: Tenderness present.     Cervical back: Normal range of motion and neck supple.     Right lower  leg: Edema (2+ pitting) present.     Left lower leg: Edema (2+ pitting) present.  Skin:    General: Skin is warm and dry.  Neurological:     General: No focal deficit present.     Mental Status: He is alert and oriented to person, place, and time.  Psychiatric:        Mood and Affect: Mood normal.        Behavior: Behavior normal.    Assessment & Plan:  1: Chronic heart failure with preserved ejection fraction- - NYHA class III - fluid overloaded with bilateral lower extremity edema - ReDs reading today 35% - echo 11/12/18: EF 60-65% with mild LVH - echo scheduled for 05/02/22; portable echo done today by Dr  Bensimhon has EF ~60-65% - furosemide 40mg  daily - potassium 28meq daily - spironolactone 12.5mg  daily - stop taking metoprolol - SGLT2 for future - compression socks given and instructed to put them on daily with removal at bedtime - BNP 03/27/22 was 152.3  2: Atrial fibrillation- - metoprolol tartrate 25mg  BID; stop this today - EKG today: NSR with PAC's overread by Dr. Haroldine Laws  3: Alcohol use/ tobacco use- - LFT's 03/27/22 elevated although mostly stable - smoking 1/2 ppd cigarettes - drinks 1/2 pint gin/ 1 beer daily for many years; needs to work on stopping   4: HTN- - BP 96/65 - needs to get established w/ new PCP as he says his previous one will no longer see him - BMP 03/27/22 showed sodium 134, potassium 4.1, creatinine 1.02 & GFR >60 - recheck labs next week since starting meds above  5: CAD/ MI- - no cath/ stress test has been done - troponin 439 on 11/11/2018 - needs general cardiology f/u    Return in 1 week, sooner if needed.   Darylene Price, NP-C    Patient seen and examined with the above-signed Advanced Practice Provider and/or Housestaff. I personally reviewed laboratory data, imaging studies and relevant notes. I independently examined the patient and formulated the important aspects of the plan. I have edited the note to reflect any of my  changes or salient points. I have personally discussed the plan with the patient and/or family.  67 y/o male with polysubstance abuse, chronic pain, PAF, reported CAD with previous MI referred by ED for further evaluation fo several week h/o LE edema.  In ER was noted to be volume overloaded. Started lasix with good effect but he now ran out.   Denies CP. Mild SOB. Major complaint is pain in his legs and joints - asking for pain meds  Still smoking and drinking. (Drinks 1-2 beers/day and 1/2 pint liquor daily)  In clinic today I did bedside echo EF 60-65% RV ok   JVP 7-8  General:  Chronically-ill appearing. No resp difficulty HEENT: normal Neck: supple. JVP 7-8 Carotids 2+ bilat; no bruits. No lymphadenopathy or thryomegaly appreciated. Cor: PMI nondisplaced. Regular rate & rhythm. No rubs, gallops or murmurs. Lungs: clear Abdomen: soft, nontender, + distended. No hepatosplenomegaly. No bruits or masses. Good bowel sounds. Extremities: no cyanosis, clubbing, rash, 2+ edema Neuro: alert & orientedx3, cranial nerves grossly intact. moves all 4 extremities w/o difficulty. Affect pleasant  He has significant LE edema but EF normal on echo and REDs is normal. Suspect may be cirrhotic in nature. Will resume lasix 40 daily and spiro 12.5 + Kcl 20.   Will need formal echo. Would likely benefit from RUQ u/s as well.   Discussed need for abstinence from tobacco and ETOH  Glori Bickers, MD  3:07 PM

## 2022-03-31 NOTE — Progress Notes (Signed)
   03/31/22 1120  ReDS Vest / Clip  Station Marker C  Ruler Value 28  ReDS Value Range < 36  ReDS Actual Value 35

## 2022-04-07 ENCOUNTER — Encounter: Payer: 59 | Admitting: Cardiology

## 2022-04-07 NOTE — Progress Notes (Deleted)
ADVANCED HEART FAILURE CLINIC NOTE  Referring Physician: No ref. provider found  Primary Care: Pcp, No Primary Cardiologist:  HPI: Thomas Coffey is a 67 y.o. male with history of coronary artery disease status post MI in 2020, hypertension, COPD, history of alcohol tobacco use and heart failure with preserved ejection fraction that regularly follows with Darylene Price presenting today to establish care.  Thomas Coffey was most recently admitted to the emergency department in March 2024 with lower extremity edema and shortness of breath requiring IV diuresis.  At home, he continues to smoke half pack per day of cigarettes along with occasional marijuana.  He drinks half a pint of gin and 1 beer daily and has been doing this for several years.  Interval history Thomas Coffey was in clinic yesterday where bedside echocardiogram demonstrated an EF of 60 to 65% and preserved RV function.  Lab work during that clinic visit demonstrated a serum creatinine of 2.16 up from his prior of 1.0.   Activity level/exercise tolerance:  *** Orthopnea:  Sleeps on *** pillows Paroxysmal noctural dyspnea:  *** Chest pain/pressure:  *** Orthostatic lightheadedness:  *** Palpitations:  *** Lower extremity edema:  *** Presyncope/syncope:  *** Cough:  ***  Past Medical History:  Diagnosis Date   Acute pancreatitis    Alcohol use    CHF (congestive heart failure) (HCC)    COPD (chronic obstructive pulmonary disease) (HCC)    Coronary artery disease    COVID-19 virus infection    positive COVID-19 test on 01/22/20   Hypertension    MI (myocardial infarction) (HCC)    Paroxysmal atrial fibrillation (HCC)     Current Outpatient Medications  Medication Sig Dispense Refill   amLODipine (NORVASC) 5 MG tablet Take 1 tablet (5 mg total) by mouth daily. 30 tablet 2   furosemide (LASIX) 40 MG tablet Take 1 tablet (40 mg total) by mouth daily. 30 tablet 5   HYDROcodone-acetaminophen (NORCO/VICODIN) 5-325 MG tablet  Take 1 tablet by mouth every 4 (four) hours as needed. 10 tablet 0   meloxicam (MOBIC) 15 MG tablet Take 1 tablet (15 mg total) by mouth daily. 30 tablet 0   potassium chloride SA (KLOR-CON M) 20 MEQ tablet Take 1 tablet (20 mEq total) by mouth daily. 30 tablet 5   spironolactone (ALDACTONE) 25 MG tablet Take 0.5 tablets (12.5 mg total) by mouth daily. 15 tablet 5   No current facility-administered medications for this visit.    No Known Allergies    Social History   Socioeconomic History   Marital status: Single    Spouse name: Not on file   Number of children: Not on file   Years of education: Not on file   Highest education level: Not on file  Occupational History   Not on file  Tobacco Use   Smoking status: Every Day    Packs/day: 1.00    Years: 30.00    Additional pack years: 0.00    Total pack years: 30.00    Types: Cigarettes   Smokeless tobacco: Never  Substance and Sexual Activity   Alcohol use: Yes    Alcohol/week: 6.0 standard drinks of alcohol    Types: 6 Cans of beer per week    Comment: daily   Drug use: Yes    Types: Marijuana   Sexual activity: Not on file  Other Topics Concern   Not on file  Social History Narrative   Not on file   Social Determinants of Health  Financial Resource Strain: Not on file  Food Insecurity: Not on file  Transportation Needs: Not on file  Physical Activity: Not on file  Stress: Not on file  Social Connections: Not on file  Intimate Partner Violence: Not on file      Family History  Problem Relation Age of Onset   Cancer Mother    Kidney disease Brother     PHYSICAL EXAM: There were no vitals filed for this visit. GENERAL: Well nourished, well developed, and in no apparent distress at rest.  HEENT: Negative for arcus senilis or xanthelasma. There is no scleral icterus.  The mucous membranes are pink and moist.   NECK: Supple, No masses. Normal carotid upstrokes without bruits. No masses or thyromegaly.     CHEST: There are no chest wall deformities. There is no chest wall tenderness. Respirations are unlabored.  Lungs- *** CARDIAC:  JVP: *** cm H2O         {HEART SOUNDS:22645}  Normal rate with regular rhythm. No murmurs, rubs or gallops.  Pulses are 2+ and symmetrical in upper and lower extremities. *** edema.  ABDOMEN: Soft, non-tender, non-distended. There are no masses or hepatomegaly. There are normal bowel sounds.  EXTREMITIES: Warm and well perfused with no cyanosis, clubbing.  LYMPHATIC: No axillary or supraclavicular lymphadenopathy.  NEUROLOGIC: Patient is oriented x3 with no focal or lateralizing neurologic deficits.  PSYCH: Patients affect is appropriate, there is no evidence of anxiety or depression.  SKIN: Warm and dry; no lesions or wounds.   DATA REVIEW  ECG: ***  As per my personal interpretation  ECHO: *** As per my personal interpretation  CATH: *** As per my personal interpretation   ASSESSMENT & PLAN:  *** Etiology of HF:*** NYHA class / AHA Stage:*** Volume status & Diuretics: *** Vasodilators:*** Beta-Blocker:*** MRA:*** Cardiometabolic:*** Devices therapies & Valvulopathies:*** Advanced therapies:***   Nariah Morgano Advanced Heart Failure Mechanical Circulatory Support

## 2022-04-11 ENCOUNTER — Other Ambulatory Visit: Payer: Self-pay

## 2022-04-11 ENCOUNTER — Emergency Department: Payer: 59

## 2022-04-11 ENCOUNTER — Emergency Department
Admission: EM | Admit: 2022-04-11 | Discharge: 2022-04-11 | Disposition: A | Discharge status: Home / Self Care | Payer: 59 | Attending: Emergency Medicine | Admitting: Emergency Medicine

## 2022-04-11 DIAGNOSIS — R0602 Shortness of breath: Secondary | ICD-10-CM | POA: Diagnosis not present

## 2022-04-11 DIAGNOSIS — R601 Generalized edema: Secondary | ICD-10-CM | POA: Diagnosis not present

## 2022-04-11 DIAGNOSIS — K7031 Alcoholic cirrhosis of liver with ascites: Secondary | ICD-10-CM | POA: Insufficient documentation

## 2022-04-11 DIAGNOSIS — R0789 Other chest pain: Secondary | ICD-10-CM | POA: Diagnosis not present

## 2022-04-11 DIAGNOSIS — R079 Chest pain, unspecified: Secondary | ICD-10-CM

## 2022-04-11 LAB — CBC
HCT: 28.1 % — ABNORMAL LOW (ref 39.0–52.0)
Hemoglobin: 9.3 g/dL — ABNORMAL LOW (ref 13.0–17.0)
MCH: 33 pg (ref 26.0–34.0)
MCHC: 33.1 g/dL (ref 30.0–36.0)
MCV: 99.6 fL (ref 80.0–100.0)
Platelets: 148 10*3/uL — ABNORMAL LOW (ref 150–400)
RBC: 2.82 MIL/uL — ABNORMAL LOW (ref 4.22–5.81)
RDW: 16.6 % — ABNORMAL HIGH (ref 11.5–15.5)
WBC: 10.2 10*3/uL (ref 4.0–10.5)
nRBC: 0 % (ref 0.0–0.2)

## 2022-04-11 LAB — BASIC METABOLIC PANEL
Anion gap: 12 (ref 5–15)
BUN: 16 mg/dL (ref 8–23)
CO2: 22 mmol/L (ref 22–32)
Calcium: 8.6 mg/dL — ABNORMAL LOW (ref 8.9–10.3)
Chloride: 103 mmol/L (ref 98–111)
Creatinine, Ser: 1.76 mg/dL — ABNORMAL HIGH (ref 0.61–1.24)
GFR, Estimated: 42 mL/min — ABNORMAL LOW (ref >60)
Glucose, Bld: 161 mg/dL — ABNORMAL HIGH (ref 70–99)
Potassium: 3.8 mmol/L (ref 3.5–5.1)
Sodium: 137 mmol/L (ref 135–145)

## 2022-04-11 LAB — TROPONIN I (HIGH SENSITIVITY)
Troponin I (High Sensitivity): 6 ng/L (ref <18)
Troponin I (High Sensitivity): 6 ng/L (ref <18)

## 2022-04-11 LAB — BRAIN NATRIURETIC PEPTIDE: B Natriuretic Peptide: 81.3 pg/mL (ref 0.0–100.0)

## 2022-04-11 MED ORDER — FUROSEMIDE 10 MG/ML IJ SOLN
20.0000 mg | Freq: Once | INTRAMUSCULAR | Status: AC
  Administered 2022-04-11: 20 mg via INTRAVENOUS
  Filled 2022-04-11: qty 4

## 2022-04-11 MED ORDER — OXYCODONE-ACETAMINOPHEN 5-325 MG PO TABS
1.0000 | ORAL_TABLET | Freq: Once | ORAL | Status: AC
  Administered 2022-04-11: 1 via ORAL
  Filled 2022-04-11: qty 1

## 2022-04-11 MED ORDER — IOHEXOL 350 MG/ML SOLN
75.0000 mL | Freq: Once | INTRAVENOUS | Status: AC | PRN
  Administered 2022-04-11: 75 mL via INTRAVENOUS

## 2022-04-11 MED ORDER — OXYCODONE HCL 5 MG PO TABS: 5.0000 mg | ORAL_TABLET | Freq: Four times a day (QID) | ORAL | 0 refills | Status: DC | PRN

## 2022-04-11 NOTE — ED Notes (Signed)
Patient transported to CT 

## 2022-04-11 NOTE — ED Notes (Signed)
X-ray at bedside

## 2022-04-11 NOTE — ED Triage Notes (Signed)
Pt to ED for generalized cp and shob, states has fluid build up. States has been seen multiple times for same, came back d/t worsening

## 2022-04-11 NOTE — ED Notes (Signed)
MD Quale at the bedside

## 2022-04-11 NOTE — ED Provider Notes (Signed)
[facility] Provider Note    Event Date/Time   First MD Initiated Contact with Patient 04/11/22 0901     (approximate)   History   Chest Pain and Shortness of Breath   HPI  Thomas Coffey is a 67 y.o. male he has a history of alcohol abuse, atrial fibrillation, encephalopathy, malnutrition  Patient reports that he has been experiencing shortness of breath and some chest pain for for a little over a week now.  He reports that is moderate in intensity, also reports he is achy in both of his legs that are swollen.  He has been taking furosemide which was prescribed to him recently and used as recently as yesterday and feels like he is still having increased shortness of breath when he lays down and also a sense of chest pressure.  The chest pressure does not radiate.  He has no fevers or chills.  Slight cough.  Without prompting, he tells me he does not use crack. Does use alcohol.     Physical Exam   Triage Vital Signs: ED Triage Vitals  Enc Vitals Group     BP 04/11/22 0902 103/79     Pulse Rate 04/11/22 0902 (!) 109     Resp 04/11/22 0902 18     Temp 04/11/22 0902 97.7 F (36.5 C)     Temp src --      SpO2 04/11/22 0902 100 %     Weight 04/11/22 0900 140 lb (63.5 kg)     Height 04/11/22 0900 5\' 8"  (1.727 m)     Head Circumference --      Peak Flow --      Pain Score 04/11/22 0859 8     Pain Loc --      Pain Edu? --      Excl. in [place]? --     Most recent vital signs: Vitals:   04/11/22 1300 04/11/22 1330  BP: 116/71 109/75  Pulse: 85 90  Resp: 17 18  Temp:    SpO2: 99% 97%     General: Awake, no distress.  Chronically ill-appearing but in no acute distress.  Appears malnourished or somewhat underweight CV:  Good peripheral perfusion.  Normal tones and rate except for slight tachycardia.  Is regular.  No murmur Resp:  Normal effort.  Lungs are clear.  No oxygen deficit. Abd:  Mild distention but very soft and nontender through all  quadrants other:  , Mild-moderate edema of the lower extremities around the ankles bilaterally.   ED Results / Procedures / Treatments   Labs (all labs ordered are listed, but only abnormal results are displayed) Labs Reviewed  BASIC METABOLIC PANEL - Abnormal; Notable for the following components:      Result Value   Glucose, Bld 161 (*)    Creatinine, Ser 1.76 (*)    Calcium 8.6 (*)    GFR, Estimated 42 (*)    All other components within normal limits  CBC - Abnormal; Notable for the following components:   RBC 2.82 (*)    Hemoglobin 9.3 (*)    HCT 28.1 (*)    RDW 16.6 (*)    Platelets 148 (*)    All other components within normal limits  BRAIN NATRIURETIC PEPTIDE  TROPONIN I (HIGH SENSITIVITY)  TROPONIN I (HIGH SENSITIVITY)     EKG  Interpreted by me at 905 heart rate 110 QRS 90 QTc 04/21/2018 Sinus tachycardia.  Additionally there is mild ST segment depression  noted in inferior and lateral distribution.  Prior to previous EKG, very subtle, but suspect likely chronic finding of similar nature. No stemi or obvious acs   RADIOLOGY US ABDOMEN LIMITED RUQ (LIVER/GB)  Result Date: 04/11/2022 CLINICAL DATA:  Right upper quadrant abdominal pain EXAM: ULTRASOUND ABDOMEN LIMITED RIGHT UPPER QUADRANT COMPARISON:  CT abdomen and pelvis dated 01/25/2022, ultrasound abdomen dated 07/04/2020 FINDINGS: Gallbladder: Cholelithiasis. Borderline gallbladder wall thickening, likely reactive. No sonographic Murphy sign noted by sonographer. Common bile duct: Diameter: 7 mm, previously up to 3 mm on 01/25/2022 Liver: Mildly nodular hepatic contour. No focal lesion identified. Heterogeneous parenchymal echogenicity. Portal vein is patent on color Doppler imaging with normal direction of blood flow towards the liver. Other: Moderate volume ascites. IMPRESSION: 1. Cholelithiasis without sonographic evidence of acute cholecystitis. 2. Mildly dilated common bile duct measuring up to 7 mm, previously 3 mm  on 01/25/2022. Recommend correlation with LFTs. 3. Mildly nodular hepatic contour with heterogeneous parenchymal echogenicity, suggestive of cirrhosis. 4. Moderate volume ascites. Electronically Signed   By: Darrin Nipper M.D.   On: 04/11/2022 13:49   CT Angio Chest PE W and/or Wo Contrast  Result Date: 04/11/2022 CLINICAL DATA:  PE suspected.  Shortness of breath. EXAM: CT ANGIOGRAPHY CHEST WITH CONTRAST TECHNIQUE: Multidetector CT imaging of the chest was performed using the standard protocol during bolus administration of intravenous contrast. Multiplanar CT image reconstructions and MIPs were obtained to evaluate the vascular anatomy. RADIATION DOSE REDUCTION: This exam was performed according to the departmental dose-optimization program which includes automated exposure control, adjustment of the mA and/or kV according to patient size and/or use of iterative reconstruction technique. CONTRAST:  75mL OMNIPAQUE IOHEXOL 350 MG/ML SOLN COMPARISON:  CT PE 01/25/22 FINDINGS: Cardiovascular: Satisfactory opacification of the pulmonary arteries to the segmental level. No evidence of pulmonary embolism. Normal heart size. No pericardial effusion. Mediastinum/Nodes: No enlarged mediastinal, hilar, or axillary lymph nodes. Thyroid gland, trachea, and esophagus demonstrate no significant findings. Lungs/Pleura: No pleural effusion. No pneumothorax. No large consolidative opacity. No new pulmonary nodule. There is diffuse bronchial wall thickening, which can be seen in the setting of bronchitis and is unchanged from prior exam. Moderate centrilobular emphysema. Upper Abdomen: Moderate to large volume ascites, increased from prior exam. Redemonstrated gallbladder distention and pancreatic calcifications. Musculoskeletal: No chest wall abnormality. No acute or significant osseous findings. Degenerative changes of the right [facility] joint Review of the MIP images confirms the above findings. IMPRESSION: 1. No evidence of pulmonary  embolism. 2. Diffuse bronchial wall thickening, which can be seen in the setting of bronchitis and is unchanged from prior exam. 3. Moderate to large volume ascites, increased from prior exam. Consider further evaluation with contrast-enhanced CT of the abdomen and pelvis. Electronically Signed   By: Marin Roberts M.D.   On: 04/11/2022 12:29   DG Chest Port 1 View  Result Date: 04/11/2022 CLINICAL DATA:  Chest pain EXAM: PORTABLE CHEST 1 VIEW COMPARISON:  Chest radiograph 03/27/2022 FINDINGS: No pleural effusion. No pneumothorax. No focal airspace opacity. Normal cardiac and mediastinal contours. No radiographically apparent displaced rib fractures. Visualized upper abdomen is unremarkable. IMPRESSION: No focal airspace opacity. Electronically Signed   By: Marin Roberts M.D.   On: 04/11/2022 09:39        PROCEDURES:  Critical Care performed: No  Procedures   MEDICATIONS ORDERED IN ED: Medications  oxyCODONE-acetaminophen (PERCOCET/ROXICET) 5-325 MG per tablet 1 tablet (1 tablet Oral Given 04/11/22 1053)  iohexol (OMNIPAQUE) 350 MG/ML injection 75 mL (75 mLs Intravenous  Contrast Given 04/11/22 1201)  furosemide (LASIX) injection 20 mg (20 mg Intravenous Given 04/11/22 1505)     IMPRESSION / MDM / ASSESSMENT AND PLAN / ED COURSE  I reviewed the triage vital signs and the nursing notes.                              Differential diagnosis includes, but is not limited to, volume overload, CHF, peripheral edema, also consider protein malnutrition, third spacing, and other causation such as possible cirrhosis given some findings of ascites noted on previous imaging studies of the abdomen.  Patient reports mild shortness of breath.  He does not have any acute oxygen deficit.  Does have a history of DVT.  He reports a very slight sense of chest tightness off and on for some time now.  Doubt ACS based on his EKG.  Troponin testing here reassuring with 2 normal troponins.  After a little while the  patient ambulating without discomfort or distress in the ER.  He does report improvement in pain with use of pain medication he reports the pain is primarily from a tight feeling of the water fluid in his legs.    Patient's presentation is most consistent with acute complicated illness / injury requiring diagnostic workup.   The patient is on the cardiac monitor to evaluate for evidence of arrhythmia and/or significant heart rate changes.  , Completion of workup I suspect the patient likely has hypoalbuminemia, liver disease, cirrhosis that is likely contributing to his edema.  Perhaps slight volume overload.  Discussed with our heart failure team and we will have him follow-up closely with them and also advised patient to follow-up closely with gastroenterologist abstain from alcohol.   ----------------------------------------- 2:48 PM on 04/11/2022 ----------------------------------------- Vitals:   04/11/22 1300 04/11/22 1330  BP: 116/71 109/75  Pulse: 85 90  Resp: 17 18  Temp:    SpO2: 99% 97%   Patient resting comfortably.  Able to ambulate without difficulty.  Vital signs of normalized.  Discussed with CHF team, recommend small additional diuresis and patient to follow-up with on Thursday at 10 AM.  His ultrasound concerning for cirrhosis.  He reports a long history of alcohol abuse and I suspect this is likely indicative of underlying cirrhosis likely due to alcohol.  He has peripheral edema.  He has no associated abdominal pain fevers chills or symptoms that would be suggestive of acute intra-abdominal infection, SBP etc.  I strongly recommended he set up follow-up with GI and abstain from alcohol use.  I will prescribe the patient a narcotic pain medicine due to their condition which I anticipate will cause at least moderate pain short term. I discussed with the patient safe use of narcotic pain medicines, and that they are not to drive, or ever take more than prescribed (no more  than 1 pill every 6 hours). We discussed that this is the type of medication that can be  overdosed on and the risks of this type of medicine. Patient is very agreeable to only use as prescribed and to never use more than prescribed.  Return precautions and treatment recommendations and follow-up discussed with the patient who is agreeable with the plan.    FINAL CLINICAL IMPRESSION(S) / ED DIAGNOSES   Final diagnoses:  Ascites due to alcoholic cirrhosis (HCC)  Generalized edema  Chest pain with low risk for cardiac etiology     Rx / DC Orders   ED  Discharge Orders          Ordered    oxyCODONE (OXY IR/ROXICODONE) 5 MG immediate release tablet  Every 6 hours PRN        04/11/22 1449             Note:  This document was prepared using Dragon voice recognition software and may include unintentional dictation errors.   Delman Kitten, MD 04/12/22 [phone]

## 2022-04-11 NOTE — Discharge Instructions (Addendum)
Please continue her current medications.  Follow-up closely with cardiology, heart failure clinic Thursday at 10 AM.  Return to the ER if you develop severe chest pain, worsening swelling, difficulty breathing, abdominal pain, nausea vomiting, confusion or other concerns arise

## 2022-04-11 NOTE — ED Notes (Signed)
Iv team at bedside  

## 2022-04-11 NOTE — ED Notes (Signed)
Pt given dishcarge instructions, pt voiced understanding of instruction. Pt unable to sign due to pen pad not working.

## 2022-04-13 ENCOUNTER — Encounter: Payer: Self-pay | Admitting: Family

## 2022-04-13 ENCOUNTER — Ambulatory Visit: Payer: 59 | Attending: Family | Admitting: Family

## 2022-04-13 VITALS — BP 85/71 | HR 111 | Resp 16 | Wt 135.0 lb

## 2022-04-13 DIAGNOSIS — Z8719 Personal history of other diseases of the digestive system: Secondary | ICD-10-CM | POA: Diagnosis not present

## 2022-04-13 DIAGNOSIS — M79606 Pain in leg, unspecified: Secondary | ICD-10-CM | POA: Insufficient documentation

## 2022-04-13 DIAGNOSIS — R0602 Shortness of breath: Secondary | ICD-10-CM | POA: Insufficient documentation

## 2022-04-13 DIAGNOSIS — K7030 Alcoholic cirrhosis of liver without ascites: Secondary | ICD-10-CM

## 2022-04-13 DIAGNOSIS — K7460 Unspecified cirrhosis of liver: Secondary | ICD-10-CM | POA: Diagnosis not present

## 2022-04-13 DIAGNOSIS — Z79899 Other long term (current) drug therapy: Secondary | ICD-10-CM | POA: Diagnosis not present

## 2022-04-13 DIAGNOSIS — G479 Sleep disorder, unspecified: Secondary | ICD-10-CM | POA: Diagnosis not present

## 2022-04-13 DIAGNOSIS — I5032 Chronic diastolic (congestive) heart failure: Secondary | ICD-10-CM

## 2022-04-13 DIAGNOSIS — R002 Palpitations: Secondary | ICD-10-CM | POA: Insufficient documentation

## 2022-04-13 DIAGNOSIS — F17210 Nicotine dependence, cigarettes, uncomplicated: Secondary | ICD-10-CM | POA: Insufficient documentation

## 2022-04-13 DIAGNOSIS — I252 Old myocardial infarction: Secondary | ICD-10-CM | POA: Diagnosis not present

## 2022-04-13 DIAGNOSIS — R42 Dizziness and giddiness: Secondary | ICD-10-CM | POA: Diagnosis not present

## 2022-04-13 DIAGNOSIS — I219 Acute myocardial infarction, unspecified: Secondary | ICD-10-CM | POA: Diagnosis not present

## 2022-04-13 DIAGNOSIS — I10 Essential (primary) hypertension: Secondary | ICD-10-CM

## 2022-04-13 DIAGNOSIS — I110 Hypertensive heart disease with heart failure: Secondary | ICD-10-CM | POA: Insufficient documentation

## 2022-04-13 DIAGNOSIS — I2510 Atherosclerotic heart disease of native coronary artery without angina pectoris: Secondary | ICD-10-CM

## 2022-04-13 DIAGNOSIS — J449 Chronic obstructive pulmonary disease, unspecified: Secondary | ICD-10-CM | POA: Insufficient documentation

## 2022-04-13 DIAGNOSIS — I480 Paroxysmal atrial fibrillation: Secondary | ICD-10-CM

## 2022-04-13 DIAGNOSIS — R609 Edema, unspecified: Secondary | ICD-10-CM | POA: Diagnosis not present

## 2022-04-13 DIAGNOSIS — I5030 Unspecified diastolic (congestive) heart failure: Secondary | ICD-10-CM | POA: Diagnosis present

## 2022-04-13 NOTE — Patient Instructions (Addendum)
Your echo (heart ultrasound) is scheduled for 05/02/22 at 11:00am. Please go to the [facility] and go to the Registration Desk.    Stop taking amlodipine

## 2022-04-13 NOTE — Progress Notes (Signed)
Patient ID: Thomas Coffey, male    DOB: [birth year], 67 y.o.   MRN: [id number]  HPI  Thomas Coffey is a 67 y/o male with a history of CAD (MI 10/2018), pancreatitis, HTN, COPD, PAF, alcohol/ tobacco use and chronic heart failure.   Echo 11/12/18: EF 60-65% with mild LVH  Was in the ED 04/11/22 due to CP/ SOB. Abd U/S suggestive of cirrhosis. Was in the ED 03/27/22 due to edema & SOB. Oral lasix given. Was in the ED 03/19/22 due to CP.   He presents today for a HF f/u visit with a chief complaint of moderate fatigue with little exertion. Chronic in nature. Has decreased appetite, cough, SOB, pedal edema (improving), dizziness, leg pain and chronic difficulty sleeping along with this. Denies abdominal distention, palpitations (unless stressed), chest pain, weight gain or N/V.   Smokes 1/2 ppd of cigarettes along with marijuana. Denies cocaine use. Drinks ~ 1/2 pint of gin and 1 beer daily and has been doing this for several years. 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   Had an abdominal u/s done 2 days ago which was suggestive of cirrhosis along with moderate ascites. Chest CTA also done negative for PE, + for emphysema and moderate/ large ascites.  Past Medical History:  Diagnosis Date   Acute pancreatitis    Alcohol use    CHF (congestive heart failure) (HCC)    COPD (chronic obstructive pulmonary disease) (HCC)    Coronary artery disease    COVID-19 virus infection    positive COVID-19 test on 01/22/20   Hypertension    MI (myocardial infarction) ([place])    Paroxysmal atrial fibrillation [facility])    Past Surgical History:  Procedure Laterality Date   ANTERIOR VITRECTOMY Right 10/04/2020   Procedure: ANTERIOR VITRECTOMY;  Surgeon: Eulogio Bear, MD;  Location: [facility];  Service: Ophthalmology;  Laterality: Right;   CATARACT EXTRACTION W/PHACO Right 10/04/2020   Procedure: CATARACT EXTRACTION PHACO AND INTRAOCULAR LENS  PLACEMENT (IOC) RIGHT VISION BLUE, CAPSULAR TENSION RING 14A  RIGHT 21.84 01:44.7;  Surgeon: Eulogio Bear, MD;  Location: [facility];  Service: Ophthalmology;  Laterality: Right;   CATARACT EXTRACTION W/PHACO Left 10/18/2020   Procedure: CATARACT EXTRACTION PHACO AND INTRAOCULAR LENS PLACEMENT (IOC) LEFT 2.95 00:23.9;  Surgeon: Eulogio Bear, MD;  Location: [facility];  Service: Ophthalmology;  Laterality: Left;   PARTIAL COLECTOMY     Family History  Problem Relation Age of Onset   Cancer Mother    Kidney disease Brother    Social History   Tobacco Use   Smoking status: Every Day    Packs/day: 1.00    Years: 30.00    Additional pack years: 0.00    Total pack years: 30.00    Types: Cigarettes   Smokeless tobacco: Never  Substance Use Topics   Alcohol use: Yes    Alcohol/week: 6.0 standard drinks of alcohol    Types: 6 Cans of beer per week    Comment: daily   No Known Allergies Prior to Admission medications   Medication Sig Start Date End Date Taking? Authorizing Provider  amLODipine (NORVASC) 5 MG tablet Take 1 tablet (5 mg total) by mouth daily. 12/31/20  Yes Nicole Kindred A, DO  furosemide (LASIX) 40 MG tablet Take 1 tablet (40 mg total) by mouth daily. 03/31/22  Yes Ani Deoliveira, Otila Kluver A, FNP  oxyCODONE (OXY IR/ROXICODONE) 5 MG immediate release tablet Take  1 tablet (5 mg total) by mouth every 6 (six) hours as needed for severe pain. 04/11/22  Yes Delman Kitten, MD  potassium chloride SA (KLOR-CON M) 20 MEQ tablet Take 1 tablet (20 mEq total) by mouth daily. 03/31/22  Yes Darylene Price A, FNP  spironolactone (ALDACTONE) 25 MG tablet Take 0.5 tablets (12.5 mg total) by mouth daily. 03/31/22  Yes Alisa Graff, FNP   Review of Systems  Constitutional:  Positive for appetite change (decreased), fatigue and unexpected weight change (weight loss).  HENT:  Negative for congestion, postnasal drip and sore throat.   Eyes: Negative.   Respiratory:  Positive for  cough (dry) and shortness of breath. Negative for chest tightness.   Cardiovascular:  Positive for leg swelling ("much better"). Negative for chest pain and palpitations.  Gastrointestinal:  Negative for abdominal distention, abdominal pain and nausea.  Endocrine: Negative.   Genitourinary: Negative.   Musculoskeletal:  Positive for arthralgias (legs; "feels like ice picks going in them").  Skin: Negative.   Allergic/Immunologic: Negative.   Neurological:  Positive for dizziness.  Hematological:  Negative for adenopathy. Does not bruise/bleed easily.  Psychiatric/Behavioral:  Positive for sleep disturbance (not sleeping well; sleeping on 1-2 pillows). The patient is not nervous/anxious.    Vitals:   04/13/22 0948  BP: (!) 85/71  Pulse: (!) 111  Resp: 16  SpO2: 100%  Weight: 135 lb (61.2 kg)   Wt Readings from Last 3 Encounters:  04/13/22 135 lb (61.2 kg)  04/11/22 140 lb (63.5 kg)  03/31/22 146 lb (66.2 kg)   Lab Results  Component Value Date   CREATININE 1.76 (H) 04/11/2022   CREATININE 2.16 (H) 03/31/2022   CREATININE 1.02 03/27/2022   Physical Exam Vitals and nursing note reviewed.  Constitutional:      Appearance: He is underweight.  HENT:     Head: Normocephalic and atraumatic.  Cardiovascular:     Rate and Rhythm: Regular rhythm. Tachycardia present.  Pulmonary:     Effort: Pulmonary effort is normal.     Breath sounds: No wheezing, rhonchi or rales.  Abdominal:     Palpations: Abdomen is soft.     Tenderness: There is no abdominal tenderness.  Musculoskeletal:     Right lower leg: Tenderness present. Edema (1+ pitting) present.     Left lower leg: Tenderness present. Edema (1+ pitting) present.  Neurological:     Mental Status: He is alert.    Assessment & Plan:  1: Ischemic heart failure with preserved ejection fraction- - NYHA class III - euvolemic - weighing daily; reminded to call for an overnight weight gain of > 2 pounds or a weekly weight gain of  > 5 pounds - weight down 11 pounds from last visit here 2 weeks ago; cirrhosis could be playing a role in this - echo 11/12/18: EF 60-65% with mild LVH - echo scheduled on 05/02/22 - continue furosemide 40mg  daily - continue potassium 46meq daily - continue spironolactone 12.5mg  daily - SGLT2 for future once BP improves - has compression socks but says that he can't wear them - was a NS for [facility] provider last week so this was rescheduled - BNP 04/11/22 was 81.3  2: Atrial fibrillation- - tachycardic today - says that when he gets stressed, he does notice palpitations  3: Cirrhosis/ alcohol use- - LFT's 03/27/22 elevated although mostly stable - smoking 1/2 ppd cigarettes; + marijuana, denies cocaine - drinks 1/2 pint gin/ 1 beer daily for many years; needs to work on  stopping  - abd u/s 04/11/22 was suggestive of cirrhosis/ ascites - GI referral made and appt scheduled for 06/19/22  4: HTN- - BP 85/71 - stop amlodipine - new PCP has been scheduled at [facility] for 05/29/22 - BMP 04/11/22 showed sodium 137, potassium 3.8, creatinine 1.76 & GFR 42  5: CAD/ MI (2020)- - no cath/ stress test has been done - troponin 04/11/22 was 6.0 - needs general cardiology f/u    Return next week, sooner if needed.

## 2022-04-19 ENCOUNTER — Other Ambulatory Visit: Payer: Self-pay

## 2022-04-19 ENCOUNTER — Emergency Department: Payer: 59

## 2022-04-19 ENCOUNTER — Emergency Department
Admission: EM | Admit: 2022-04-19 | Discharge: 2022-04-19 | Disposition: A | Discharge status: Home / Self Care | Payer: 59 | Attending: Student in an Organized Health Care Education/Training Program | Admitting: Student in an Organized Health Care Education/Training Program

## 2022-04-19 ENCOUNTER — Ambulatory Visit: Payer: 59 | Admitting: Internal Medicine

## 2022-04-19 DIAGNOSIS — J449 Chronic obstructive pulmonary disease, unspecified: Secondary | ICD-10-CM | POA: Insufficient documentation

## 2022-04-19 DIAGNOSIS — R7989 Other specified abnormal findings of blood chemistry: Secondary | ICD-10-CM | POA: Insufficient documentation

## 2022-04-19 DIAGNOSIS — R079 Chest pain, unspecified: Secondary | ICD-10-CM | POA: Diagnosis present

## 2022-04-19 DIAGNOSIS — R1084 Generalized abdominal pain: Secondary | ICD-10-CM | POA: Diagnosis not present

## 2022-04-19 DIAGNOSIS — I509 Heart failure, unspecified: Secondary | ICD-10-CM | POA: Insufficient documentation

## 2022-04-19 DIAGNOSIS — Z7141 Alcohol abuse counseling and surveillance of alcoholic: Secondary | ICD-10-CM

## 2022-04-19 DIAGNOSIS — K860 Alcohol-induced chronic pancreatitis: Secondary | ICD-10-CM

## 2022-04-19 DIAGNOSIS — K7031 Alcoholic cirrhosis of liver with ascites: Secondary | ICD-10-CM

## 2022-04-19 DIAGNOSIS — R109 Unspecified abdominal pain: Secondary | ICD-10-CM

## 2022-04-19 LAB — HEPATIC FUNCTION PANEL
ALT: 17 U/L (ref 0–44)
AST: 54 U/L — ABNORMAL HIGH (ref 15–41)
Albumin: 2.1 g/dL — ABNORMAL LOW (ref 3.5–5.0)
Alkaline Phosphatase: 100 U/L (ref 38–126)
Bilirubin, Direct: 1.8 mg/dL — ABNORMAL HIGH (ref 0.0–0.2)
Indirect Bilirubin: 2.8 mg/dL — ABNORMAL HIGH (ref 0.3–0.9)
Total Bilirubin: 4.6 mg/dL — ABNORMAL HIGH (ref 0.3–1.2)
Total Protein: 7.4 g/dL (ref 6.5–8.1)

## 2022-04-19 LAB — LIPASE, BLOOD: Lipase: 53 U/L — ABNORMAL HIGH (ref 11–51)

## 2022-04-19 LAB — BASIC METABOLIC PANEL
Anion gap: 12 (ref 5–15)
BUN: 16 mg/dL (ref 8–23)
CO2: 20 mmol/L — ABNORMAL LOW (ref 22–32)
Calcium: 8.4 mg/dL — ABNORMAL LOW (ref 8.9–10.3)
Chloride: 101 mmol/L (ref 98–111)
Creatinine, Ser: 1.69 mg/dL — ABNORMAL HIGH (ref 0.61–1.24)
GFR, Estimated: 44 mL/min — ABNORMAL LOW (ref >60)
Glucose, Bld: 141 mg/dL — ABNORMAL HIGH (ref 70–99)
Potassium: 3.4 mmol/L — ABNORMAL LOW (ref 3.5–5.1)
Sodium: 133 mmol/L — ABNORMAL LOW (ref 135–145)

## 2022-04-19 LAB — CBC
HCT: 29.6 % — ABNORMAL LOW (ref 39.0–52.0)
Hemoglobin: 9.8 g/dL — ABNORMAL LOW (ref 13.0–17.0)
MCH: 33.1 pg (ref 26.0–34.0)
MCHC: 33.1 g/dL (ref 30.0–36.0)
MCV: 100 fL (ref 80.0–100.0)
Platelets: 226 10*3/uL (ref 150–400)
RBC: 2.96 MIL/uL — ABNORMAL LOW (ref 4.22–5.81)
RDW: 17.1 % — ABNORMAL HIGH (ref 11.5–15.5)
WBC: 10.3 10*3/uL (ref 4.0–10.5)
nRBC: 0 % (ref 0.0–0.2)

## 2022-04-19 LAB — TROPONIN I (HIGH SENSITIVITY)
Troponin I (High Sensitivity): 6 ng/L (ref <18)
Troponin I (High Sensitivity): 7 ng/L (ref <18)

## 2022-04-19 MED ORDER — IOHEXOL 300 MG/ML  SOLN
75.0000 mL | Freq: Once | INTRAMUSCULAR | Status: AC | PRN
  Administered 2022-04-19: 75 mL via INTRAVENOUS

## 2022-04-19 MED ORDER — OXYCODONE-ACETAMINOPHEN 5-325 MG PO TABS: 1.0000 | ORAL_TABLET | ORAL | 0 refills | Status: AC | PRN

## 2022-04-19 MED ORDER — ONDANSETRON 4 MG PO TBDP: 4.0000 mg | ORAL_TABLET | Freq: Three times a day (TID) | ORAL | 0 refills | Status: DC | PRN

## 2022-04-19 MED ORDER — OXYCODONE HCL 5 MG PO TABS
5.0000 mg | ORAL_TABLET | ORAL | Status: DC | PRN
  Administered 2022-04-19: 5 mg via ORAL
  Filled 2022-04-19: qty 1

## 2022-04-19 NOTE — ED Provider Notes (Signed)
[facility] Provider Note    Event Date/Time   First MD Initiated Contact with Patient 04/19/22 1243     (approximate)   History   Chest Pain   HPI  Thomas Coffey is a 67 y.o. male extensive past medical history including emphysema, CHF, COPD, pancreatitis, presents to the ER for evaluation of pain all over.  Patient does admit to still consuming alcohol daily.  States that he is drinking less than he was previously.  States that he feels swollen.  States he was post to have follow-up as an outpatient but something "got messed up in the paperwork."  Denies any fevers no vomiting.  No tremors.     Physical Exam   Triage Vital Signs: ED Triage Vitals [04/19/22 1109]  Enc Vitals Group     BP 117/86     Pulse Rate (!) 105     Resp 18     Temp 98 F (36.7 C)     Temp Source Oral     SpO2 100 %     Weight      Height      Head Circumference      Peak Flow      Pain Score 5     Pain Loc      Pain Edu?      Excl. in [place]?     Most recent vital signs: Vitals:   04/19/22 1415 04/19/22 1455  BP: 118/80   Pulse: 92   Resp: 17   Temp:  98.4 F (36.9 C)  SpO2: 99%      Constitutional: Alert, chronically ill-appearing. Eyes: Conjunctivae are normal.  Head: Atraumatic. Nose: No congestion/rhinnorhea. Mouth/Throat: Mucous membranes are moist.   Neck: Painless ROM.  Cardiovascular:   Good peripheral circulation. Respiratory: Normal respiratory effort.  No retractions.  Gastrointestinal: Soft with mild generalized tenderness to palpation.  No guarding or rebound. Musculoskeletal:  no deformity Neurologic:  MAE spontaneously. No gross focal neurologic deficits are appreciated.  Skin:  Skin is warm, dry and intact. No rash noted. Psychiatric: Mood and affect are normal. Speech and behavior are normal.    ED Results / Procedures / Treatments   Labs (all labs ordered are listed, but only abnormal results are displayed) Labs Reviewed  BASIC  METABOLIC PANEL - Abnormal; Notable for the following components:      Result Value   Sodium 133 (*)    Potassium 3.4 (*)    CO2 20 (*)    Glucose, Bld 141 (*)    Creatinine, Ser 1.69 (*)    Calcium 8.4 (*)    GFR, Estimated 44 (*)    All other components within normal limits  CBC - Abnormal; Notable for the following components:   RBC 2.96 (*)    Hemoglobin 9.8 (*)    HCT 29.6 (*)    RDW 17.1 (*)    All other components within normal limits  HEPATIC FUNCTION PANEL - Abnormal; Notable for the following components:   Albumin 2.1 (*)    AST 54 (*)    Total Bilirubin 4.6 (*)    Bilirubin, Direct 1.8 (*)    Indirect Bilirubin 2.8 (*)    All other components within normal limits  LIPASE, BLOOD - Abnormal; Notable for the following components:   Lipase 53 (*)    All other components within normal limits  TROPONIN I (HIGH SENSITIVITY)  TROPONIN I (HIGH SENSITIVITY)     EKG  ED ECG  REPORT I, Merlyn Lot, the attending physician, personally viewed and interpreted this ECG.   Date: 04/19/2022  EKG Time: 11:00  Rate: 105  Rhythm: sinus  Axis: normal  Intervals:  normal qt  ST&T Change: no stemi, no depression    RADIOLOGY Please see ED Course for my review and interpretation.  I personally reviewed all radiographic images ordered to evaluate for the above acute complaints and reviewed radiology reports and findings.  These findings were personally discussed with the patient.  Please see medical record for radiology report.    PROCEDURES:  Critical Care performed: No  Procedures   MEDICATIONS ORDERED IN ED: Medications  oxyCODONE (Oxy IR/ROXICODONE) immediate release tablet 5 mg (5 mg Oral Given 04/19/22 1321)     IMPRESSION / MDM / ASSESSMENT AND PLAN / ED COURSE  I reviewed the triage vital signs and the nursing notes.                              Differential diagnosis includes, but is not limited to, colitis, obstruction, cirrhosis, noncompliance,  chronic pain syndrome, pancreatitis, SBP, electrolyte abnormality  Patient presenting to the ER for evaluation of symptoms as described above.  Based on symptoms, risk factors and considered above differential, this presenting complaint could reflect a potentially life-threatening illness therefore the patient will be placed on continuous pulse oximetry and telemetry for monitoring.  Laboratory evaluation will be sent to evaluate for the above complaints.      Clinical Course as of 04/19/22 1526  Wed Apr 19, 2022  1431 LFTs are roughly at baseline.  Awaiting CT imaging. [PR]  [id number] History of cirrhosis and h/o pancreatitis, etoh use, CT a/p obtained. Labs okay and at baseline [SM]    Clinical Course User Index [PR] Merlyn Lot, MD [SM] Nathaniel Man, MD   Patient was signed out to oncoming physician pending follow-up CT imaging.  FINAL CLINICAL IMPRESSION(S) / ED DIAGNOSES   Final diagnoses:  Abdominal pain, unspecified abdominal location     Rx / DC Orders   ED Discharge Orders     None        Note:  This document was prepared using Dragon voice recognition software and may include unintentional dictation errors.    Merlyn Lot, MD 04/19/22 [phone]

## 2022-04-19 NOTE — Discharge Instructions (Addendum)
You are seen in the emergency department for abdominal pain.  You are found to have an findings concerning for pancreatitis.  Concern this is from your alcohol use.  You also have fluid in your abdomen from your alcohol use.  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  It was recommended that you stay in the hospital however you wanted to go home and follow-up as an outpatient.  Discussed a clear liquid diet.  Encouraged on alcohol cessation.  Given resources for outpatient rehab.  Will do a short course of pain medication.  You were given a prescription for narcotic pain medications.  Take only if in severe pain.  These are very addictive medications.  These medications can make you constipated.  If you need to take more than 1-2 doses, start a stool softner.  If you become constipated, take 1 capfull of MiraLAX, can repeat untill having regular bowel movements.  Keep this medication out of reach of any children.  zofran (ondansetron) - nausea medication, take 1 tablet every 8 hours as needed for nausea/vomiting.

## 2022-04-19 NOTE — ED Notes (Signed)
Attempted blood draw x 1 right ac.  Unable.

## 2022-04-19 NOTE — ED Provider Notes (Addendum)
Care assumed of patient from outgoing provider.  See their note for initial history, exam and plan.  Clinical Course as of 04/19/22 1601  Wed Apr 19, 2022  1431 LFTs are roughly at baseline.  Awaiting CT imaging. [PR]  [id number] History of cirrhosis and h/o pancreatitis, etoh use, CT a/p obtained. Labs okay and at baseline [SM]    Clinical Course User Index [PR] Merlyn Lot, MD [SM] Nathaniel Man, MD   CT scan consistent with worsening ascites and findings consistent with acute on chronic pancreatitis.  Mildly elevated lipase.  Recommended admission to the hospital given that the patient was having ongoing abdominal pain.  Patient was sleeping in the emergency department but complaining of pain that was 7/10.  Recommended admission.  Patient ultimately decided to leave AMA.  Discussed worsening condition as an outpatient including abdominal infection that could lead to severe mortality or morbidity.  Patient expressed understanding and states that he wants to go home and follow-up as an outpatient with his primary care physician.  Encouraged on alcohol cessation.  Discussed clear liquid diet.   Nathaniel Man, MD 04/19/22 [id number]    Nathaniel Man, MD 04/19/22 1730

## 2022-04-19 NOTE — ED Triage Notes (Signed)
Pt to ED via POV from home. Pt reports ongoing CP that has not subsided and has gotten worse. Pt seen on 3/26 for same.

## 2022-04-19 NOTE — ED Triage Notes (Signed)
Pt states has been having chest pain for "been starting". Pt states he has been losing weight and not been able to eat.

## 2022-05-02 ENCOUNTER — Ambulatory Visit: Payer: 59

## 2022-05-02 ENCOUNTER — Other Ambulatory Visit: Payer: Self-pay

## 2022-05-02 ENCOUNTER — Emergency Department: Payer: 59

## 2022-05-02 ENCOUNTER — Emergency Department
Admission: EM | Admit: 2022-05-02 | Discharge: 2022-05-02 | Disposition: A | Discharge status: Home / Self Care | Payer: 59 | Attending: Emergency Medicine | Admitting: Emergency Medicine

## 2022-05-02 ENCOUNTER — Telehealth: Payer: Self-pay

## 2022-05-02 DIAGNOSIS — J449 Chronic obstructive pulmonary disease, unspecified: Secondary | ICD-10-CM | POA: Diagnosis not present

## 2022-05-02 DIAGNOSIS — R0789 Other chest pain: Secondary | ICD-10-CM | POA: Diagnosis not present

## 2022-05-02 DIAGNOSIS — R7989 Other specified abnormal findings of blood chemistry: Secondary | ICD-10-CM | POA: Insufficient documentation

## 2022-05-02 DIAGNOSIS — R000 Tachycardia, unspecified: Secondary | ICD-10-CM | POA: Diagnosis not present

## 2022-05-02 DIAGNOSIS — I509 Heart failure, unspecified: Secondary | ICD-10-CM | POA: Diagnosis not present

## 2022-05-02 DIAGNOSIS — R600 Localized edema: Secondary | ICD-10-CM | POA: Insufficient documentation

## 2022-05-02 DIAGNOSIS — R079 Chest pain, unspecified: Secondary | ICD-10-CM | POA: Diagnosis present

## 2022-05-02 LAB — CBC
HCT: 29.1 % — ABNORMAL LOW (ref 39.0–52.0)
Hemoglobin: 9.6 g/dL — ABNORMAL LOW (ref 13.0–17.0)
MCH: 32.5 pg (ref 26.0–34.0)
MCHC: 33 g/dL (ref 30.0–36.0)
MCV: 98.6 fL (ref 80.0–100.0)
Platelets: 203 10*3/uL (ref 150–400)
RBC: 2.95 MIL/uL — ABNORMAL LOW (ref 4.22–5.81)
RDW: 15.9 % — ABNORMAL HIGH (ref 11.5–15.5)
WBC: 11.1 10*3/uL — ABNORMAL HIGH (ref 4.0–10.5)
nRBC: 0 % (ref 0.0–0.2)

## 2022-05-02 LAB — BASIC METABOLIC PANEL
Anion gap: 12 (ref 5–15)
BUN: 14 mg/dL (ref 8–23)
CO2: 20 mmol/L — ABNORMAL LOW (ref 22–32)
Calcium: 8.6 mg/dL — ABNORMAL LOW (ref 8.9–10.3)
Chloride: 102 mmol/L (ref 98–111)
Creatinine, Ser: 1.59 mg/dL — ABNORMAL HIGH (ref 0.61–1.24)
GFR, Estimated: 48 mL/min — ABNORMAL LOW (ref >60)
Glucose, Bld: 168 mg/dL — ABNORMAL HIGH (ref 70–99)
Potassium: 3.4 mmol/L — ABNORMAL LOW (ref 3.5–5.1)
Sodium: 134 mmol/L — ABNORMAL LOW (ref 135–145)

## 2022-05-02 LAB — HEPATIC FUNCTION PANEL
ALT: 18 U/L (ref 0–44)
AST: 72 U/L — ABNORMAL HIGH (ref 15–41)
Albumin: 2 g/dL — ABNORMAL LOW (ref 3.5–5.0)
Alkaline Phosphatase: 159 U/L — ABNORMAL HIGH (ref 38–126)
Bilirubin, Direct: 1.3 mg/dL — ABNORMAL HIGH (ref 0.0–0.2)
Indirect Bilirubin: 1.9 mg/dL — ABNORMAL HIGH (ref 0.3–0.9)
Total Bilirubin: 3.2 mg/dL — ABNORMAL HIGH (ref 0.3–1.2)
Total Protein: 7.4 g/dL (ref 6.5–8.1)

## 2022-05-02 LAB — BRAIN NATRIURETIC PEPTIDE: B Natriuretic Peptide: 199.9 pg/mL — ABNORMAL HIGH (ref 0.0–100.0)

## 2022-05-02 LAB — LIPASE, BLOOD: Lipase: 54 U/L — ABNORMAL HIGH (ref 11–51)

## 2022-05-02 LAB — TROPONIN I (HIGH SENSITIVITY)
Troponin I (High Sensitivity): 6 ng/L (ref <18)
Troponin I (High Sensitivity): 6 ng/L (ref <18)

## 2022-05-02 MED ORDER — OXYCODONE-ACETAMINOPHEN 5-325 MG PO TABS
1.0000 | ORAL_TABLET | Freq: Once | ORAL | Status: AC
  Administered 2022-05-02: 1 via ORAL

## 2022-05-02 MED ORDER — OXYCODONE-ACETAMINOPHEN 5-325 MG PO TABS
1.0000 | ORAL_TABLET | Freq: Once | ORAL | Status: AC
  Administered 2022-05-02: 1 via ORAL
  Filled 2022-05-02: qty 1

## 2022-05-02 MED ORDER — OXYCODONE HCL 5 MG PO TABS: 5.0000 mg | ORAL_TABLET | Freq: Four times a day (QID) | ORAL | 0 refills | Status: AC | PRN

## 2022-05-02 MED ORDER — OXYCODONE-ACETAMINOPHEN 5-325 MG PO TABS
ORAL_TABLET | ORAL | Status: AC
  Filled 2022-05-02: qty 1

## 2022-05-02 MED ORDER — DIPHENHYDRAMINE HCL 50 MG/ML IJ SOLN
25.0000 mg | Freq: Once | INTRAMUSCULAR | Status: AC
  Administered 2022-05-02: 25 mg via INTRAVENOUS
  Filled 2022-05-02: qty 1

## 2022-05-02 MED ORDER — SODIUM CHLORIDE 0.9 % IV BOLUS
500.0000 mL | Freq: Once | INTRAVENOUS | Status: AC
  Administered 2022-05-02: 500 mL via INTRAVENOUS

## 2022-05-02 NOTE — Discharge Instructions (Addendum)
Follow-up with your regular doctor.  You may take the oxycodone as needed for pain over the next several days.  Return to the ER for new, worsening, or persistent severe chest or abdominal pain, shortness of breath, swelling, weakness or lightheadedness, vomiting, fever, or any other new or worsening symptoms that concern you.

## 2022-05-02 NOTE — ED Triage Notes (Signed)
Pt to ED for chest pain and shob for 4 weeks. Last ETOH this am .

## 2022-05-02 NOTE — Progress Notes (Addendum)
error 

## 2022-05-02 NOTE — ED Provider Notes (Signed)
[facility] Provider Note    Event Date/Time   First MD Initiated Contact with Patient 05/02/22 1643     (approximate)   History   Chest Pain and Shortness of Breath   HPI  Thomas Coffey is a 67 y.o. male with a history of COPD, CHF, and pancreatitis who presents with multiple complaints.  Specifically the patient reports bilateral back, chest, and leg pain as well as increased shortness of breath for the last several weeks.  He denies any cough or fever.  He has no acute abdominal pain.  The patient states that his abdominal and leg swelling have improved significantly over the last few weeks.  He denies any vomiting or diarrhea.  I reviewed the medical records.  The patient was seen in the ED on 4/3 with pain all over.  CT showed worsening ascites and findings of acute on chronic pancreatitis.  He was planned for admission but left AMA.  He was also seen in ED for shortness of breath and abdominal pain on 3/26, shortness of breath on 3/11, and chest pain and shortness of breath on 3/3.  His most recent outpatient encounter was at the CHF clinic on 3/28 and increased fatigue.  He was found to be euvolemic at that time.   Physical Exam   Triage Vital Signs: ED Triage Vitals  Enc Vitals Group     BP 05/02/22 1232 103/69     Pulse Rate 05/02/22 1232 (!) 118     Resp 05/02/22 1232 18     Temp 05/02/22 1235 98.6 F (37 C)     Temp src --      SpO2 05/02/22 1232 98 %     Weight 05/02/22 1233 140 lb (63.5 kg)     Height 05/02/22 1233 5\' 9"  (1.753 m)     Head Circumference --      Peak Flow --      Pain Score 05/02/22 1233 7     Pain Loc --      Pain Edu? --      Excl. in GC? --     Most recent vital signs: Vitals:   05/02/22 2000 05/02/22 2030  BP: 105/67 103/71  Pulse: (!) 104 (!) 101  Resp: 14 16  Temp:    SpO2: 98% 100%     General: Awake, no distress.  CV:  Good peripheral perfusion.  Resp:  Normal effort.  Lungs CTAB. Abd:  Mild  distention.  Other:  Trace bilateral lower extremity edema.  No jaundice or scleral icterus.   ED Results / Procedures / Treatments   Labs (all labs ordered are listed, but only abnormal results are displayed) Labs Reviewed  BASIC METABOLIC PANEL - Abnormal; Notable for the following components:      Result Value   Sodium 134 (*)    Potassium 3.4 (*)    CO2 20 (*)    Glucose, Bld 168 (*)    Creatinine, Ser 1.59 (*)    Calcium 8.6 (*)    GFR, Estimated 48 (*)    All other components within normal limits  CBC - Abnormal; Notable for the following components:   WBC 11.1 (*)    RBC 2.95 (*)    Hemoglobin 9.6 (*)    HCT 29.1 (*)    RDW 15.9 (*)    All other components within normal limits  HEPATIC FUNCTION PANEL - Abnormal; Notable for the following components:   Albumin 2.0 (*)  AST 72 (*)    Alkaline Phosphatase 159 (*)    Total Bilirubin 3.2 (*)    Bilirubin, Direct 1.3 (*)    Indirect Bilirubin 1.9 (*)    All other components within normal limits  LIPASE, BLOOD - Abnormal; Notable for the following components:   Lipase 54 (*)    All other components within normal limits  BRAIN NATRIURETIC PEPTIDE - Abnormal; Notable for the following components:   B Natriuretic Peptide 199.9 (*)    All other components within normal limits  TROPONIN I (HIGH SENSITIVITY)  TROPONIN I (HIGH SENSITIVITY)     EKG  ED ECG REPORT I, Dionne Bucy, the attending physician, personally viewed and interpreted this ECG.  Date: 05/02/2022 EKG Time: 1234 Rate: 121 Rhythm: Sinus tachycardia QRS Axis: Left axis Intervals: normal ST/T Wave abnormalities: nonspecific abnormalities  Narrative Interpretation: no evidence of acute ischemia    RADIOLOGY  Chest x-ray: I independently viewed and interpreted the images; there is no focal consolidation or edema  PROCEDURES:  Critical Care performed: No  Procedures   MEDICATIONS ORDERED IN ED: Medications  oxyCODONE-acetaminophen  (PERCOCET/ROXICET) 5-325 MG per tablet 1 tablet (has no administration in time range)  oxyCODONE-acetaminophen (PERCOCET/ROXICET) 5-325 MG per tablet 1 tablet (1 tablet Oral Given 05/02/22 1728)  sodium chloride 0.9 % bolus 500 mL (0 mLs Intravenous Stopped 05/02/22 2023)  diphenhydrAMINE (BENADRYL) injection 25 mg (25 mg Intravenous Given 05/02/22 1941)     IMPRESSION / MDM / ASSESSMENT AND PLAN / ED COURSE  I reviewed the triage vital signs and the nursing notes.  67 year old male with PMH as noted above presents with chest and back pain as well as some increased shortness of breath, but reports that his ascites and edema are significantly improved over the last few weeks.  He has had multiple similar presentations over the last several weeks.  Differential diagnosis includes, but is not limited to, pulmonary edema, CHF, fluid overload due to cirrhosis, musculoskeletal pain, acute bronchitis.  We will obtain chest x-ray, lab workup, give analgesia, and reassess.  Patient's presentation is most consistent with acute presentation with potential threat to life or bodily function.  The patient is on the cardiac monitor to evaluate for evidence of arrhythmia and/or significant heart rate changes.   ----------------------------------------- 9:47 PM on 05/02/2022 -----------------------------------------  Chest x-ray shows no evidence of edema.  BNP is minimally elevated.  Troponins are negative x 2.  There is no evidence of fluid overload clinically.  The patient actually is likely somewhat hypovolemic after being diuresed recently.  He was somewhat tachycardic so I gave 500 mL of fluid.  The remainder of the lab workup is unremarkable and unchanged from recent labs.  Lipase, LFTs, hemoglobin, and creatinine are all either similar to or improved from 2 weeks ago.  The patient states he is feeling much better and appears comfortable.  At this time, there is no indication for inpatient admission.   He is stable for discharge home.  His heart rate is in the range of 100-105, which based on prior visits appears to be his baseline.  I have prescribed a small quantity of oxycodone for his pain exacerbation.  I instructed him to follow-up with his primary care provider and continue his medications.  I gave strict return precautions and he expressed understanding.  FINAL CLINICAL IMPRESSION(S) / ED DIAGNOSES   Final diagnoses:  Atypical chest pain     Rx / DC Orders   ED Discharge Orders  Ordered    oxyCODONE (ROXICODONE) 5 MG immediate release tablet  Every 6 hours PRN        05/02/22 2146             Note:  This document was prepared using Dragon voice recognition software and may include unintentional dictation errors.    Dionne Bucy, MD 05/02/22 2149

## 2022-05-02 NOTE — Telephone Encounter (Addendum)
4/15 @ 1400 :Attempted to call pt for upcoming ECHO on 4/16 @1100 . Pt did not answered and was unable to leave voice mail.

## 2022-05-10 ENCOUNTER — Emergency Department: Payer: 59

## 2022-05-10 ENCOUNTER — Inpatient Hospital Stay
Admission: EM | Admit: 2022-05-10 | Discharge: 2022-06-17 | DRG: 432 | Disposition: E | Payer: 59 | Attending: Internal Medicine | Admitting: Internal Medicine

## 2022-05-10 ENCOUNTER — Other Ambulatory Visit: Payer: Self-pay

## 2022-05-10 ENCOUNTER — Encounter: Payer: Self-pay | Admitting: Family Medicine

## 2022-05-10 DIAGNOSIS — F1020 Alcohol dependence, uncomplicated: Secondary | ICD-10-CM | POA: Diagnosis present

## 2022-05-10 DIAGNOSIS — I10 Essential (primary) hypertension: Secondary | ICD-10-CM

## 2022-05-10 DIAGNOSIS — R627 Adult failure to thrive: Secondary | ICD-10-CM | POA: Diagnosis present

## 2022-05-10 DIAGNOSIS — E43 Unspecified severe protein-calorie malnutrition: Secondary | ICD-10-CM | POA: Diagnosis present

## 2022-05-10 DIAGNOSIS — R64 Cachexia: Secondary | ICD-10-CM | POA: Diagnosis present

## 2022-05-10 DIAGNOSIS — R54 Age-related physical debility: Secondary | ICD-10-CM | POA: Diagnosis present

## 2022-05-10 DIAGNOSIS — Z515 Encounter for palliative care: Secondary | ICD-10-CM

## 2022-05-10 DIAGNOSIS — K7682 Hepatic encephalopathy: Secondary | ICD-10-CM | POA: Diagnosis present

## 2022-05-10 DIAGNOSIS — G9341 Metabolic encephalopathy: Secondary | ICD-10-CM | POA: Diagnosis not present

## 2022-05-10 DIAGNOSIS — F1290 Cannabis use, unspecified, uncomplicated: Secondary | ICD-10-CM | POA: Diagnosis present

## 2022-05-10 DIAGNOSIS — R000 Tachycardia, unspecified: Secondary | ICD-10-CM

## 2022-05-10 DIAGNOSIS — I2510 Atherosclerotic heart disease of native coronary artery without angina pectoris: Secondary | ICD-10-CM | POA: Diagnosis present

## 2022-05-10 DIAGNOSIS — M5136 Other intervertebral disc degeneration, lumbar region: Secondary | ICD-10-CM | POA: Diagnosis present

## 2022-05-10 DIAGNOSIS — E876 Hypokalemia: Secondary | ICD-10-CM | POA: Diagnosis present

## 2022-05-10 DIAGNOSIS — Z681 Body mass index (BMI) 19 or less, adult: Secondary | ICD-10-CM

## 2022-05-10 DIAGNOSIS — K852 Alcohol induced acute pancreatitis without necrosis or infection: Secondary | ICD-10-CM | POA: Diagnosis present

## 2022-05-10 DIAGNOSIS — R0902 Hypoxemia: Secondary | ICD-10-CM | POA: Diagnosis not present

## 2022-05-10 DIAGNOSIS — F17210 Nicotine dependence, cigarettes, uncomplicated: Secondary | ICD-10-CM | POA: Diagnosis present

## 2022-05-10 DIAGNOSIS — Z9049 Acquired absence of other specified parts of digestive tract: Secondary | ICD-10-CM

## 2022-05-10 DIAGNOSIS — K7460 Unspecified cirrhosis of liver: Secondary | ICD-10-CM

## 2022-05-10 DIAGNOSIS — I959 Hypotension, unspecified: Secondary | ICD-10-CM | POA: Diagnosis present

## 2022-05-10 DIAGNOSIS — I5032 Chronic diastolic (congestive) heart failure: Secondary | ICD-10-CM

## 2022-05-10 DIAGNOSIS — A419 Sepsis, unspecified organism: Secondary | ICD-10-CM | POA: Diagnosis not present

## 2022-05-10 DIAGNOSIS — Z720 Tobacco use: Secondary | ICD-10-CM | POA: Diagnosis present

## 2022-05-10 DIAGNOSIS — I5030 Unspecified diastolic (congestive) heart failure: Secondary | ICD-10-CM | POA: Insufficient documentation

## 2022-05-10 DIAGNOSIS — K7031 Alcoholic cirrhosis of liver with ascites: Principal | ICD-10-CM | POA: Diagnosis present

## 2022-05-10 DIAGNOSIS — Z7189 Other specified counseling: Secondary | ICD-10-CM

## 2022-05-10 DIAGNOSIS — J440 Chronic obstructive pulmonary disease with acute lower respiratory infection: Secondary | ICD-10-CM | POA: Diagnosis present

## 2022-05-10 DIAGNOSIS — I4891 Unspecified atrial fibrillation: Secondary | ICD-10-CM | POA: Diagnosis present

## 2022-05-10 DIAGNOSIS — Z79899 Other long term (current) drug therapy: Secondary | ICD-10-CM

## 2022-05-10 DIAGNOSIS — M62838 Other muscle spasm: Secondary | ICD-10-CM | POA: Diagnosis present

## 2022-05-10 DIAGNOSIS — J449 Chronic obstructive pulmonary disease, unspecified: Secondary | ICD-10-CM

## 2022-05-10 DIAGNOSIS — F1910 Other psychoactive substance abuse, uncomplicated: Secondary | ICD-10-CM

## 2022-05-10 DIAGNOSIS — Z841 Family history of disorders of kidney and ureter: Secondary | ICD-10-CM

## 2022-05-10 DIAGNOSIS — E870 Hyperosmolality and hypernatremia: Secondary | ICD-10-CM | POA: Diagnosis not present

## 2022-05-10 DIAGNOSIS — I480 Paroxysmal atrial fibrillation: Secondary | ICD-10-CM | POA: Diagnosis present

## 2022-05-10 DIAGNOSIS — K860 Alcohol-induced chronic pancreatitis: Secondary | ICD-10-CM | POA: Diagnosis present

## 2022-05-10 DIAGNOSIS — R1084 Generalized abdominal pain: Secondary | ICD-10-CM | POA: Diagnosis not present

## 2022-05-10 DIAGNOSIS — E8809 Other disorders of plasma-protein metabolism, not elsewhere classified: Secondary | ICD-10-CM | POA: Diagnosis present

## 2022-05-10 DIAGNOSIS — R109 Unspecified abdominal pain: Secondary | ICD-10-CM | POA: Diagnosis present

## 2022-05-10 DIAGNOSIS — K56609 Unspecified intestinal obstruction, unspecified as to partial versus complete obstruction: Secondary | ICD-10-CM

## 2022-05-10 DIAGNOSIS — E871 Hypo-osmolality and hyponatremia: Secondary | ICD-10-CM | POA: Diagnosis present

## 2022-05-10 DIAGNOSIS — F05 Delirium due to known physiological condition: Secondary | ICD-10-CM | POA: Diagnosis not present

## 2022-05-10 DIAGNOSIS — D509 Iron deficiency anemia, unspecified: Secondary | ICD-10-CM | POA: Diagnosis not present

## 2022-05-10 DIAGNOSIS — K861 Other chronic pancreatitis: Secondary | ICD-10-CM

## 2022-05-10 DIAGNOSIS — R188 Other ascites: Principal | ICD-10-CM

## 2022-05-10 DIAGNOSIS — Z8616 Personal history of COVID-19: Secondary | ICD-10-CM

## 2022-05-10 DIAGNOSIS — D649 Anemia, unspecified: Secondary | ICD-10-CM

## 2022-05-10 DIAGNOSIS — D696 Thrombocytopenia, unspecified: Secondary | ICD-10-CM | POA: Diagnosis not present

## 2022-05-10 DIAGNOSIS — N179 Acute kidney failure, unspecified: Secondary | ICD-10-CM | POA: Diagnosis not present

## 2022-05-10 DIAGNOSIS — R1310 Dysphagia, unspecified: Secondary | ICD-10-CM | POA: Diagnosis present

## 2022-05-10 DIAGNOSIS — Z66 Do not resuscitate: Secondary | ICD-10-CM | POA: Diagnosis not present

## 2022-05-10 DIAGNOSIS — D689 Coagulation defect, unspecified: Secondary | ICD-10-CM | POA: Diagnosis not present

## 2022-05-10 DIAGNOSIS — R6520 Severe sepsis without septic shock: Secondary | ICD-10-CM | POA: Diagnosis not present

## 2022-05-10 DIAGNOSIS — K652 Spontaneous bacterial peritonitis: Secondary | ICD-10-CM | POA: Diagnosis present

## 2022-05-10 DIAGNOSIS — F1010 Alcohol abuse, uncomplicated: Secondary | ICD-10-CM

## 2022-05-10 DIAGNOSIS — I252 Old myocardial infarction: Secondary | ICD-10-CM

## 2022-05-10 DIAGNOSIS — J690 Pneumonitis due to inhalation of food and vomit: Secondary | ICD-10-CM | POA: Diagnosis not present

## 2022-05-10 LAB — MAGNESIUM: Magnesium: 1.8 mg/dL (ref 1.7–2.4)

## 2022-05-10 LAB — HEPATIC FUNCTION PANEL
ALT: 15 U/L (ref 0–44)
AST: 53 U/L — ABNORMAL HIGH (ref 15–41)
Albumin: 1.8 g/dL — ABNORMAL LOW (ref 3.5–5.0)
Alkaline Phosphatase: 96 U/L (ref 38–126)
Bilirubin, Direct: 1.5 mg/dL — ABNORMAL HIGH (ref 0.0–0.2)
Indirect Bilirubin: 2.4 mg/dL — ABNORMAL HIGH (ref 0.3–0.9)
Total Bilirubin: 3.9 mg/dL — ABNORMAL HIGH (ref 0.3–1.2)
Total Protein: 7 g/dL (ref 6.5–8.1)

## 2022-05-10 LAB — AMMONIA: Ammonia: 33 umol/L (ref 9–35)

## 2022-05-10 LAB — URINALYSIS, W/ REFLEX TO CULTURE (INFECTION SUSPECTED)
Bacteria, UA: NONE SEEN
Bilirubin Urine: NEGATIVE
Glucose, UA: NEGATIVE mg/dL
Hgb urine dipstick: NEGATIVE
Ketones, ur: NEGATIVE mg/dL
Leukocytes,Ua: NEGATIVE
Nitrite: NEGATIVE
Protein, ur: NEGATIVE mg/dL
Specific Gravity, Urine: >1.046 — ABNORMAL HIGH (ref 1.005–1.030)
pH: 5 (ref 5.0–8.0)

## 2022-05-10 LAB — BASIC METABOLIC PANEL
Anion gap: 6 (ref 5–15)
BUN: 11 mg/dL (ref 8–23)
CO2: 22 mmol/L (ref 22–32)
Calcium: 8.4 mg/dL — ABNORMAL LOW (ref 8.9–10.3)
Chloride: 105 mmol/L (ref 98–111)
Creatinine, Ser: 1.5 mg/dL — ABNORMAL HIGH (ref 0.61–1.24)
GFR, Estimated: 51 mL/min — ABNORMAL LOW (ref >60)
Glucose, Bld: 144 mg/dL — ABNORMAL HIGH (ref 70–99)
Potassium: 3.4 mmol/L — ABNORMAL LOW (ref 3.5–5.1)
Sodium: 133 mmol/L — ABNORMAL LOW (ref 135–145)

## 2022-05-10 LAB — CBC
HCT: 27.3 % — ABNORMAL LOW (ref 39.0–52.0)
Hemoglobin: 8.8 g/dL — ABNORMAL LOW (ref 13.0–17.0)
MCH: 32.6 pg (ref 26.0–34.0)
MCHC: 32.2 g/dL (ref 30.0–36.0)
MCV: 101.1 fL — ABNORMAL HIGH (ref 80.0–100.0)
Platelets: 193 10*3/uL (ref 150–400)
RBC: 2.7 MIL/uL — ABNORMAL LOW (ref 4.22–5.81)
RDW: 15.7 % — ABNORMAL HIGH (ref 11.5–15.5)
WBC: 9.6 10*3/uL (ref 4.0–10.5)
nRBC: 0 % (ref 0.0–0.2)

## 2022-05-10 LAB — FERRITIN: Ferritin: 66 ng/mL (ref 24–336)

## 2022-05-10 LAB — FOLATE: Folate: 6.7 ng/mL (ref >5.9)

## 2022-05-10 LAB — LIPASE, BLOOD: Lipase: 46 U/L (ref 11–51)

## 2022-05-10 LAB — URINE DRUG SCREEN, QUALITATIVE (ARMC ONLY)
Amphetamines, Ur Screen: NOT DETECTED
Barbiturates, Ur Screen: NOT DETECTED
Benzodiazepine, Ur Scrn: NOT DETECTED
Cannabinoid 50 Ng, Ur ~~LOC~~: POSITIVE — AB
Cocaine Metabolite,Ur ~~LOC~~: NOT DETECTED
MDMA (Ecstasy)Ur Screen: NOT DETECTED
Methadone Scn, Ur: NOT DETECTED
Opiate, Ur Screen: NOT DETECTED
Phencyclidine (PCP) Ur S: NOT DETECTED

## 2022-05-10 LAB — TYPE AND SCREEN

## 2022-05-10 LAB — TROPONIN I (HIGH SENSITIVITY)
Troponin I (High Sensitivity): 5 ng/L (ref <18)
Troponin I (High Sensitivity): 4 ng/L (ref <18)

## 2022-05-10 LAB — ETHANOL: Alcohol, Ethyl (B): <10 mg/dL (ref <10)

## 2022-05-10 LAB — PHOSPHORUS: Phosphorus: 3.1 mg/dL (ref 2.5–4.6)

## 2022-05-10 LAB — RETICULOCYTES
Immature Retic Fract: 15.3 % (ref 2.3–15.9)
RBC.: 2.69 MIL/uL — ABNORMAL LOW (ref 4.22–5.81)
Retic Count, Absolute: 59.4 10*3/uL (ref 19.0–186.0)
Retic Ct Pct: 2.2 % (ref 0.4–3.1)

## 2022-05-10 LAB — CREATININE, URINE, RANDOM: Creatinine, Urine: 130 mg/dL

## 2022-05-10 LAB — IRON AND TIBC
Iron: 53 ug/dL (ref 45–182)
Saturation Ratios: 40 % — ABNORMAL HIGH (ref 17.9–39.5)
TIBC: 133 ug/dL — ABNORMAL LOW (ref 250–450)
UIBC: 80 ug/dL

## 2022-05-10 LAB — SODIUM, URINE, RANDOM: Sodium, Ur: <10 mmol/L

## 2022-05-10 LAB — PROTIME-INR
INR: 1.9 — ABNORMAL HIGH (ref 0.8–1.2)
Prothrombin Time: 21.4 seconds — ABNORMAL HIGH (ref 11.4–15.2)

## 2022-05-10 LAB — VITAMIN B12: Vitamin B-12: 324 pg/mL (ref 180–914)

## 2022-05-10 LAB — BRAIN NATRIURETIC PEPTIDE: B Natriuretic Peptide: 117 pg/mL — ABNORMAL HIGH (ref 0.0–100.0)

## 2022-05-10 MED ORDER — FUROSEMIDE 40 MG PO TABS
40.0000 mg | ORAL_TABLET | Freq: Every day | ORAL | Status: DC
  Filled 2022-05-10: qty 1

## 2022-05-10 MED ORDER — OXYCODONE HCL 5 MG PO TABS
5.0000 mg | ORAL_TABLET | Freq: Once | ORAL | Status: AC
  Administered 2022-05-10: 5 mg via ORAL
  Filled 2022-05-10: qty 1

## 2022-05-10 MED ORDER — ADULT MULTIVITAMIN W/MINERALS CH
1.0000 | ORAL_TABLET | Freq: Every day | ORAL | Status: DC
  Administered 2022-05-11 – 2022-05-24 (×9): 1 via ORAL
  Filled 2022-05-10 (×11): qty 1

## 2022-05-10 MED ORDER — ALBUMIN HUMAN 25 % IV SOLN
50.0000 g | Freq: Once | INTRAVENOUS | Status: AC
  Administered 2022-05-10: 50 g via INTRAVENOUS
  Filled 2022-05-10: qty 200

## 2022-05-10 MED ORDER — ONDANSETRON HCL 4 MG PO TABS: 4.0000 mg | ORAL_TABLET | Freq: Four times a day (QID) | ORAL | Status: DC | PRN

## 2022-05-10 MED ORDER — SPIRONOLACTONE 12.5 MG HALF TABLET
12.5000 mg | ORAL_TABLET | Freq: Every day | ORAL | Status: DC
  Filled 2022-05-10: qty 1

## 2022-05-10 MED ORDER — POTASSIUM CHLORIDE CRYS ER 20 MEQ PO TBCR
20.0000 meq | EXTENDED_RELEASE_TABLET | Freq: Every day | ORAL | Status: DC
  Administered 2022-05-11 – 2022-05-21 (×9): 20 meq via ORAL
  Filled 2022-05-10 (×12): qty 1

## 2022-05-10 MED ORDER — HYDROCERIN EX CREA
TOPICAL_CREAM | Freq: Two times a day (BID) | CUTANEOUS | Status: DC
  Filled 2022-05-10 (×2): qty 113

## 2022-05-10 MED ORDER — PANTOPRAZOLE SODIUM 40 MG IV SOLR
40.0000 mg | Freq: Two times a day (BID) | INTRAVENOUS | Status: DC
  Administered 2022-05-10 – 2022-05-20 (×20): 40 mg via INTRAVENOUS
  Filled 2022-05-10 (×20): qty 10

## 2022-05-10 MED ORDER — ENOXAPARIN SODIUM 40 MG/0.4ML IJ SOSY
40.0000 mg | PREFILLED_SYRINGE | INTRAMUSCULAR | Status: DC
  Administered 2022-05-10 – 2022-05-26 (×15): 40 mg via SUBCUTANEOUS
  Filled 2022-05-10 (×16): qty 0.4

## 2022-05-10 MED ORDER — THIAMINE HCL 100 MG/ML IJ SOLN
100.0000 mg | Freq: Every day | INTRAMUSCULAR | Status: DC
  Administered 2022-05-10 – 2022-05-23 (×4): 100 mg via INTRAVENOUS
  Filled 2022-05-10 (×6): qty 2

## 2022-05-10 MED ORDER — THIAMINE MONONITRATE 100 MG PO TABS
100.0000 mg | ORAL_TABLET | Freq: Every day | ORAL | Status: DC
  Administered 2022-05-11 – 2022-05-26 (×11): 100 mg via ORAL
  Filled 2022-05-10 (×13): qty 1

## 2022-05-10 MED ORDER — SODIUM CHLORIDE 0.9 % IV SOLN
2.0000 g | INTRAVENOUS | Status: DC
  Administered 2022-05-10: 2 g via INTRAVENOUS
  Filled 2022-05-10 (×2): qty 20

## 2022-05-10 MED ORDER — SODIUM CHLORIDE 0.9 % IV BOLUS: 500.0000 mL | Freq: Once | INTRAVENOUS | Status: DC

## 2022-05-10 MED ORDER — NICOTINE 21 MG/24HR TD PT24
21.0000 mg | MEDICATED_PATCH | Freq: Every day | TRANSDERMAL | Status: DC
  Administered 2022-05-10 – 2022-05-26 (×17): 21 mg via TRANSDERMAL
  Filled 2022-05-10 (×17): qty 1

## 2022-05-10 MED ORDER — LORAZEPAM 2 MG/ML IJ SOLN
0.5000 mg | INTRAMUSCULAR | Status: DC | PRN
  Administered 2022-05-10 – 2022-05-24 (×10): 0.5 mg via INTRAVENOUS
  Filled 2022-05-10 (×11): qty 1

## 2022-05-10 MED ORDER — LORAZEPAM 1 MG PO TABS
1.0000 mg | ORAL_TABLET | ORAL | Status: AC | PRN
  Administered 2022-05-12: 2 mg via ORAL
  Filled 2022-05-10: qty 2

## 2022-05-10 MED ORDER — FOLIC ACID 5 MG/ML IJ SOLN
1.0000 mg | Freq: Every day | INTRAMUSCULAR | Status: DC
  Administered 2022-05-10 – 2022-05-23 (×3): 1 mg via INTRAVENOUS
  Filled 2022-05-10 (×3): qty 0.2

## 2022-05-10 MED ORDER — IOHEXOL 350 MG/ML SOLN
100.0000 mL | Freq: Once | INTRAVENOUS | Status: AC | PRN
  Administered 2022-05-10: 100 mL via INTRAVENOUS

## 2022-05-10 MED ORDER — FOLIC ACID 1 MG PO TABS
1.0000 mg | ORAL_TABLET | Freq: Every day | ORAL | Status: DC
  Administered 2022-05-11 – 2022-05-26 (×11): 1 mg via ORAL
  Filled 2022-05-10 (×13): qty 1

## 2022-05-10 MED ORDER — LORAZEPAM 0.5 MG PO TABS: 0.5000 mg | ORAL_TABLET | ORAL | Status: AC | PRN

## 2022-05-10 MED ORDER — OXYCODONE HCL 5 MG PO TABS: 5.0000 mg | ORAL_TABLET | Freq: Once | ORAL | Status: DC

## 2022-05-10 MED ORDER — FOLIC ACID 1 MG PO TABS: 1.0000 mg | ORAL_TABLET | Freq: Every day | ORAL | Status: DC

## 2022-05-10 MED ORDER — ONDANSETRON HCL 4 MG/2ML IJ SOLN
4.0000 mg | Freq: Four times a day (QID) | INTRAMUSCULAR | Status: DC | PRN
  Administered 2022-05-15 – 2022-05-21 (×6): 4 mg via INTRAVENOUS
  Filled 2022-05-10 (×6): qty 2

## 2022-05-10 MED ORDER — ONDANSETRON HCL 4 MG/2ML IJ SOLN
4.0000 mg | Freq: Once | INTRAMUSCULAR | Status: AC
  Administered 2022-05-10: 4 mg via INTRAVENOUS
  Filled 2022-05-10: qty 2

## 2022-05-10 NOTE — Assessment & Plan Note (Signed)
1 pack/day smoker Discussed cessation Nicotine patch

## 2022-05-10 NOTE — Assessment & Plan Note (Signed)
Noted generalized abdominal pain in the setting of ascites as well as imaging concerning for possible small bowel obstruction Positive bowel movement within the past 12 hours Will monitor for now Keep n.p.o. Formal general surgery consult as clinically indicated Follow

## 2022-05-10 NOTE — Assessment & Plan Note (Signed)
EKG was sinus tachycardia today Does not appear to be on medical treatment Follow

## 2022-05-10 NOTE — Assessment & Plan Note (Signed)
Generalized abdominal pain on presentation Suspect multifactorial in the setting of ascites,?  SBO, chronic pancreatitis Pending paracentesis Will place on IV Rocephin for SBP prophylaxis Blood, urine, ascitic fluid cultures Lipase stable with chronic pancreatitis on imaging N.p.o. Follow

## 2022-05-10 NOTE — Assessment & Plan Note (Signed)
Chronic pancreatitis on imaging Lipase 46 Monitor

## 2022-05-10 NOTE — ED Provider Notes (Signed)
[facility] Provider Note    Event Date/Time   First MD Initiated Contact with Patient 05/10/22 [phone]     (approximate)   History   No chief complaint on file.   HPI  Thomas Coffey is a 67 y.o. male with history of alcohol use who comes in with chest pain and shortness of breath with nausea.  Patient reports feeling unwell including some chest pain and shortness of breath.  He also reports nausea.  He reports chronic abdominal extension and left upper quadrant pain with some associated nausea.  He states that he has had some mild headaches but denies any falls or hitting his head.  He reports just feeling generalized weakness all over.  He states that he has not been drinking alcohol as much is normal and that he is cut down significantly.  Reports difficulties ambulating and keeping food down did have a bowel movement this morning denies any black tarry stools or vomiting blood.   Physical Exam   Triage Vital Signs: Blood pressure 114/78, pulse (!) 116, temperature 97.9 F (36.6 C), resp. rate 18, weight 59.1 kg, SpO2 100 %.  Most recent vital signs: Vitals:   05/10/22 0741 05/10/22 0743  BP: 114/78   Pulse: (!) 116   Resp: 18   Temp:  97.9 F (36.6 C)  SpO2: 100%      General: Awake, no distress.  CV:  Good peripheral perfusion.  Resp:  Normal effort.  Abd:  Mild distention but no significant tenderness on abdominal palpation. Other:  Able to lift both legs up off the bed equal grip strength.   ED Results / Procedures / Treatments   Labs (all labs ordered are listed, but only abnormal results are displayed) Labs Reviewed  BASIC METABOLIC PANEL - Abnormal; Notable for the following components:      Result Value   Sodium 133 (*)    Potassium 3.4 (*)    Glucose, Bld 144 (*)    Creatinine, Ser 1.50 (*)    Calcium 8.4 (*)    GFR, Estimated 51 (*)    All other components within normal limits  CBC - Abnormal; Notable for the following  components:   RBC 2.70 (*)    Hemoglobin 8.8 (*)    HCT 27.3 (*)    MCV 101.1 (*)    RDW 15.7 (*)    All other components within normal limits  HEPATIC FUNCTION PANEL - Abnormal; Notable for the following components:   Albumin 1.8 (*)    AST 53 (*)    Total Bilirubin 3.9 (*)    Bilirubin, Direct 1.5 (*)    Indirect Bilirubin 2.4 (*)    All other components within normal limits  LIPASE, BLOOD  MAGNESIUM  BRAIN NATRIURETIC PEPTIDE  URINALYSIS, ROUTINE W REFLEX MICROSCOPIC  URINE DRUG SCREEN, QUALITATIVE ([facility] ONLY)  TROPONIN I (HIGH SENSITIVITY)  TROPONIN I (HIGH SENSITIVITY)     EKG  My interpretation of EKG:  Sinus tachycardia rate of 117 without any ST elevation or T wave inversions except for lead III, normal intervals  RADIOLOGY I have reviewed the xray personally and interpreted and no evidence of any pneumonia   PROCEDURES:  Critical Care performed: No  .1-3 Lead EKG Interpretation  Performed by: Concha Se, MD Authorized by: Concha Se, MD     Interpretation: abnormal     ECG rate:  116   ECG rate assessment: tachycardic     Rhythm: sinus tachycardia  Ectopy: none     Conduction: normal      MEDICATIONS ORDERED IN ED: Medications  ondansetron (ZOFRAN) injection 4 mg (has no administration in time range)  oxyCODONE (Oxy IR/ROXICODONE) immediate release tablet 5 mg (5 mg Oral Given 05/10/22 0834)  iohexol (OMNIPAQUE) 350 MG/ML injection 100 mL (100 mLs Intravenous Contrast Given 05/10/22 0929)     IMPRESSION / MDM / ASSESSMENT AND PLAN / ED COURSE  I reviewed the triage vital signs and the nursing notes.   Patient's presentation is most consistent with acute presentation with potential threat to life or bodily function.   Patient comes in with generalized weakness, shortness of breath, chest pain, left upper quadrant pain.  Will proceed with blood work-  Hepatic function shows slightly worsening bilirubin from 8 days ago.  He has known  cirrhosis.  BMP shows stable creatinine.  Hemoglobin has been downtrending slightly to 8.8.  Initial troponin negative.  CT head and neck were negative CT imaging concerning for cirrhosis with moderate ascites, possible bowel obstruction versus ileitis versus enteritis.  He does report some nausea some slight tenderness but his tenderness is more in his left upper quadrant which could just be from his chronic pancreatitis.  We discussed admission versus discharge patient reports feeling weak and not feeling comfortable with discharge.  We discussed the hospital team for admission.  My suspicion for SBP is pretty low at this time given no fever no white count but will get IR to do a paracentesis and send off testing.  Will discuss with the hospital team for admission  The patient is on the cardiac monitor to evaluate for evidence of arrhythmia and/or significant heart rate changes.      FINAL CLINICAL IMPRESSION(S) / ED DIAGNOSES   Final diagnoses:  Other ascites  Cirrhosis of liver with ascites, unspecified hepatic cirrhosis type  Tachycardia  Anemia, unspecified type     Rx / DC Orders   ED Discharge Orders     None        Note:  This document was prepared using Dragon voice recognition software and may include unintentional dictation errors.   Concha Se, MD 05/10/22 1040

## 2022-05-10 NOTE — Assessment & Plan Note (Addendum)
+   cirrhosis on imaging w/ moderate diffuse ascites pending paracentesis  Long standing ETOH abuse  AST 53, ALT 15, T bili 3.9, INR 1.9, Cr 1.5  MELD score 26  Will start on spironolactone and lasix regimen in am  Start low dose BB in am  IV PPI GI consult as clinically indicated

## 2022-05-10 NOTE — Assessment & Plan Note (Signed)
BP stable Titrate regimen 

## 2022-05-10 NOTE — Assessment & Plan Note (Addendum)
Patient with hepatic encephalopathy, elevated liver function tests, coagulopathy.  Last ammonia level 21.  Had paracentesis on 4/25 with 2 L removed, another paracentesis on 5/9 with 3 L removed.  Will start low-dose Aldactone.  Likely will start Lasix tomorrow.

## 2022-05-10 NOTE — H&P (Addendum)
History and Physical    Patient: ZERIC Coffey ZOX:[id number] DOB: Nov 10, 1955 DOA: 05/10/2022 DOS: the patient was seen and examined on 05/10/2022 PCP: Pcp, No  Patient coming from: Home  Chief Complaint:  Chief Complaint  Patient presents with   Chest Pain   Shortness of Breath   HPI: Thomas Coffey is a 67 y.o. male with medical history significant of multiple medical issues including heavy alcohol use, substance abuse, CHF, COPD, hypertension, coronary artery disease, atrial fibrillation presenting with abdominal pain, ascites, cirrhosis, small bowel obstruction, AKI.  Patient reports worsening abdominal pain for several weeks.  Patient is known to be seen at least 4-5 times in the ER over the past month.  Baseline heavy alcohol use.  Drinks at least 1 pint a day.  Also with at least 1-2 beers.  Patient states has been trying to decrease his use.  1 pack/day smoker.  Occasional marijuana use.  Denies any other illicit drug use.  Has had generalized pain.  Mild shortness of breath.  Minimal chest pain.  No hemiparesis or confusion.  Positive weight loss as well as decreased p.o. intake.  Patient denies any night sweats.  No reported black or bloody stools Presented to the ER afebrile, hemodynamically stable.  White count 9.6, hemoglobin 8.8, platelets 193, creatinine 1.5, AST 53, T. bili 3.9, ALT 15, BNP 117.  INR 1.9.  Chest x-ray CT head, CT C-spine and CT angio of the chest grossly stable.  CT abdomen pelvis with moderate diffuse abdominal ascites as well as concern for possible partial small bowel obstruction versus ileus versus enteritis.  Chronic pancreatitis.  Review of Systems: As mentioned in the history of present illness. All other systems reviewed and are negative. Past Medical History:  Diagnosis Date   Acute pancreatitis    Alcohol use    CHF (congestive heart failure)    COPD (chronic obstructive pulmonary disease)    Coronary artery disease    COVID-19 virus infection     positive COVID-19 test on 01/22/20   Hypertension    MI (myocardial infarction)    Paroxysmal atrial fibrillation    Past Surgical History:  Procedure Laterality Date   ANTERIOR VITRECTOMY Right 10/04/2020   Procedure: ANTERIOR VITRECTOMY;  Surgeon: Nevada Crane, MD;  Location: [facility];  Service: Ophthalmology;  Laterality: Right;   CATARACT EXTRACTION W/PHACO Right 10/04/2020   Procedure: CATARACT EXTRACTION PHACO AND INTRAOCULAR LENS PLACEMENT (IOC) RIGHT VISION BLUE, CAPSULAR TENSION RING 14A  RIGHT 21.84 01:44.7;  Surgeon: Nevada Crane, MD;  Location: [facility];  Service: Ophthalmology;  Laterality: Right;   CATARACT EXTRACTION W/PHACO Left 10/18/2020   Procedure: CATARACT EXTRACTION PHACO AND INTRAOCULAR LENS PLACEMENT (IOC) LEFT 2.95 00:23.9;  Surgeon: Nevada Crane, MD;  Location: [facility];  Service: Ophthalmology;  Laterality: Left;   PARTIAL COLECTOMY     Social History:  reports that he has been smoking cigarettes. He has a 30.00 pack-year smoking history. He has never used smokeless tobacco. He reports current alcohol use of about 6.0 standard drinks of alcohol per week. He reports current drug use. Drug: Marijuana.  No Known Allergies  Family History  Problem Relation Age of Onset   Cancer Mother    Kidney disease Brother     Prior to Admission medications   Medication Sig Start Date End Date Taking? Authorizing Provider  furosemide (LASIX) 40 MG tablet Take 1 tablet (40 mg total) by mouth daily. 03/31/22   Delma Freeze,  FNP  ondansetron (ZOFRAN-ODT) 4 MG disintegrating tablet Take 1 tablet (4 mg total) by mouth every 8 (eight) hours as needed for nausea or vomiting. 04/19/22   Corena Herter, MD  potassium chloride SA (KLOR-CON M) 20 MEQ tablet Take 1 tablet (20 mEq total) by mouth daily. 03/31/22   Delma Freeze, FNP  spironolactone (ALDACTONE) 25 MG tablet Take 0.5 tablets (12.5 mg total) by mouth daily. 03/31/22   Delma Freeze, FNP    Physical Exam: Vitals:   05/10/22 0741 05/10/22 0743 05/10/22 1100 05/10/22 1200  BP: 114/78  95/65 105/79  Pulse: (!) 116  90 79  Resp: 18  14 15   Temp:  97.9 F (36.6 C)  97.6 F (36.4 C)  TempSrc:    Oral  SpO2: 100%  97% 100%  Weight: 59.1 kg   58.8 kg  Height:    5\' 9"  (1.753 m)   Physical Exam Constitutional:      Comments: Underweight  HENT:     Head: Normocephalic and atraumatic.     Nose: Nose normal.     Mouth/Throat:     Mouth: Mucous membranes are moist.  Eyes:     Pupils: Pupils are equal, round, and reactive to light.  Cardiovascular:     Rate and Rhythm: Normal rate and regular rhythm.  Pulmonary:     Effort: Pulmonary effort is normal.  Abdominal:     Comments: Positive generalized abdominal distention Positive mild tenderness palpation diffusely  Musculoskeletal:        General: Normal range of motion.     Cervical back: Normal range of motion.  Neurological:     General: No focal deficit present.  Psychiatric:        Mood and Affect: Mood normal.     Data Reviewed:  There are no new results to review at this time. CT ABDOMEN PELVIS W CONTRAST CLINICAL DATA:  Abdominal pain, acute. Chest pain and shortness of breath.  EXAM: CT ANGIOGRAPHY CHEST  CT ABDOMEN AND PELVIS WITH CONTRAST  TECHNIQUE: Multidetector CT imaging of the chest was performed using the standard protocol during bolus administration of intravenous contrast. Multiplanar CT image reconstructions and MIPs were obtained to evaluate the vascular anatomy. Multidetector CT imaging of the abdomen and pelvis was performed using the standard protocol during bolus administration of intravenous contrast.  RADIATION DOSE REDUCTION: This exam was performed according to the departmental dose-optimization program which includes automated exposure control, adjustment of the mA and/or kV according to patient size and/or use of iterative reconstruction  technique.  CONTRAST:  OMNIPAQUE IOHEXOL 350 MG/ML SOLN  COMPARISON:  CT AP 04/19/2022 an CTA chest 04/11/2022  FINDINGS: CTA CHEST FINDINGS  Cardiovascular: Satisfactory opacification of the pulmonary arteries to the segmental level. No evidence of pulmonary embolism. Normal heart size. No pericardial effusion. Aortic atherosclerosis and coronary artery calcifications.  Mediastinum/Nodes: No enlarged mediastinal, hilar, or axillary lymph nodes. Thyroid gland, trachea, and esophagus demonstrate no significant findings.  Lungs/Pleura: There is no pneumothorax. No pleural effusion, interstitial edema or airspace consolidation. Emphysema and diffuse bronchial wall thickening. Several calcified granulomas identified.  Musculoskeletal: No chest wall abnormality. No acute or significant osseous findings.  Review of the MIP images confirms the above findings.  CT ABDOMEN and PELVIS FINDINGS  Hepatobiliary: Cirrhotic liver. Too small to characterize hypodensity in lateral segment measures 5 mm. No suspicious enhancing liver lesions identified. Gallstones are identified measuring up to 8 mm. No gallbladder wall thickening. No significant bile duct  dilatation.  Pancreas: Changes of chronic pancreatitis identified. Diffuse intraductal and parenchymal calcifications are identified. There are several cystic lesions within the head and neck of pancreas. The largest is in the neck measuring 1.6 cm, image 41/4. Unchanged from previous exam.  Spleen: Normal in size without focal abnormality.  Adrenals/Urinary Tract: Adrenal glands are unremarkable. Kidneys are normal, without renal calculi, focal lesion, or hydronephrosis. Bladder is unremarkable.  Stomach/Bowel: Stomach appears nondistended. No pathologic dilatation of the large or small bowel loops. Signs of previous resection of the cecum with right abdominal enterocolonic anastomosis. There is mild increase caliber of the  mid small bowel loops which measure up to 3.2 cm, image 14/7. Transition to decreased caliber distal small bowel noted within the lower abdomen/pelvis. There is no pathologic dilatation of the colon. Mild wall thickening involving the descending colon is nonspecific and may reflect hepatic colopathy.  Vascular/Lymphatic: Aortic atherosclerosis. Decreased caliber of the patent extrahepatic portal vein. Portal venous confluence and SMV remain patent. Splenic vein is patent. Upper abdominal varices. No abdominopelvic adenopathy.  Reproductive: Prostate is unremarkable.  Other: Moderate diffuse abdominal ascites within the abdomen and pelvis, similar in volume to the previous exam. No signs of pneumoperitoneum. No discrete fluid collections.  Musculoskeletal: No acute or significant osseous findings.  Review of the MIP images confirms the above findings.  IMPRESSION: 1. No evidence for acute pulmonary embolus. 2. Morphologic features of the liver compatible with cirrhosis. 3. Moderate diffuse abdominal ascites, similar in volume to the previous exam. 4. Mild increase caliber of the mid small bowel loops with transition to decreased caliber distal small bowel loops noted within the lower abdomen/pelvis. Findings may reflect partial small bowel obstruction versus ileus versus enteritis. 5. Mild wall thickening involving the descending colon is nonspecific and may reflect hepatic colopathy. 6. Chronic pancreatitis with several cystic lesions within the head and neck of pancreas. The largest is in the neck measuring 1.6 cm. Recommend follow-up pancreatic protocol CT or MRI in 12 months. 7. Gallstones. 8.  Aortic Atherosclerosis (ICD10-I70.0).  Electronically Signed   By: Signa Kell M.D.   On: 05/10/2022 10:12 CT Angio Chest PE W and/or Wo Contrast CLINICAL DATA:  Abdominal pain, acute. Chest pain and shortness of breath.  EXAM: CT ANGIOGRAPHY CHEST  CT ABDOMEN AND PELVIS  WITH CONTRAST  TECHNIQUE: Multidetector CT imaging of the chest was performed using the standard protocol during bolus administration of intravenous contrast. Multiplanar CT image reconstructions and MIPs were obtained to evaluate the vascular anatomy. Multidetector CT imaging of the abdomen and pelvis was performed using the standard protocol during bolus administration of intravenous contrast.  RADIATION DOSE REDUCTION: This exam was performed according to the departmental dose-optimization program which includes automated exposure control, adjustment of the mA and/or kV according to patient size and/or use of iterative reconstruction technique.  CONTRAST:  OMNIPAQUE IOHEXOL 350 MG/ML SOLN  COMPARISON:  CT AP 04/19/2022 an CTA chest 04/11/2022  FINDINGS: CTA CHEST FINDINGS  Cardiovascular: Satisfactory opacification of the pulmonary arteries to the segmental level. No evidence of pulmonary embolism. Normal heart size. No pericardial effusion. Aortic atherosclerosis and coronary artery calcifications.  Mediastinum/Nodes: No enlarged mediastinal, hilar, or axillary lymph nodes. Thyroid gland, trachea, and esophagus demonstrate no significant findings.  Lungs/Pleura: There is no pneumothorax. No pleural effusion, interstitial edema or airspace consolidation. Emphysema and diffuse bronchial wall thickening. Several calcified granulomas identified.  Musculoskeletal: No chest wall abnormality. No acute or significant osseous findings.  Review of the MIP images confirms  the above findings.  CT ABDOMEN and PELVIS FINDINGS  Hepatobiliary: Cirrhotic liver. Too small to characterize hypodensity in lateral segment measures 5 mm. No suspicious enhancing liver lesions identified. Gallstones are identified measuring up to 8 mm. No gallbladder wall thickening. No significant bile duct dilatation.  Pancreas: Changes of chronic pancreatitis identified. Diffuse intraductal and  parenchymal calcifications are identified. There are several cystic lesions within the head and neck of pancreas. The largest is in the neck measuring 1.6 cm, image 41/4. Unchanged from previous exam.  Spleen: Normal in size without focal abnormality.  Adrenals/Urinary Tract: Adrenal glands are unremarkable. Kidneys are normal, without renal calculi, focal lesion, or hydronephrosis. Bladder is unremarkable.  Stomach/Bowel: Stomach appears nondistended. No pathologic dilatation of the large or small bowel loops. Signs of previous resection of the cecum with right abdominal enterocolonic anastomosis. There is mild increase caliber of the mid small bowel loops which measure up to 3.2 cm, image 14/7. Transition to decreased caliber distal small bowel noted within the lower abdomen/pelvis. There is no pathologic dilatation of the colon. Mild wall thickening involving the descending colon is nonspecific and may reflect hepatic colopathy.  Vascular/Lymphatic: Aortic atherosclerosis. Decreased caliber of the patent extrahepatic portal vein. Portal venous confluence and SMV remain patent. Splenic vein is patent. Upper abdominal varices. No abdominopelvic adenopathy.  Reproductive: Prostate is unremarkable.  Other: Moderate diffuse abdominal ascites within the abdomen and pelvis, similar in volume to the previous exam. No signs of pneumoperitoneum. No discrete fluid collections.  Musculoskeletal: No acute or significant osseous findings.  Review of the MIP images confirms the above findings.  IMPRESSION: 1. No evidence for acute pulmonary embolus. 2. Morphologic features of the liver compatible with cirrhosis. 3. Moderate diffuse abdominal ascites, similar in volume to the previous exam. 4. Mild increase caliber of the mid small bowel loops with transition to decreased caliber distal small bowel loops noted within the lower abdomen/pelvis. Findings may reflect partial small bowel  obstruction versus ileus versus enteritis. 5. Mild wall thickening involving the descending colon is nonspecific and may reflect hepatic colopathy. 6. Chronic pancreatitis with several cystic lesions within the head and neck of pancreas. The largest is in the neck measuring 1.6 cm. Recommend follow-up pancreatic protocol CT or MRI in 12 months. 7. Gallstones. 8.  Aortic Atherosclerosis (ICD10-I70.0).  Electronically Signed   By: Signa Kell M.D.   On: 05/10/2022 10:12 CT Cervical Spine Wo Contrast CLINICAL DATA:  Headaches and neck pain, initial encounter  EXAM: CT HEAD WITHOUT CONTRAST  CT CERVICAL SPINE WITHOUT CONTRAST  TECHNIQUE: Multidetector CT imaging of the head and cervical spine was performed following the standard protocol without intravenous contrast. Multiplanar CT image reconstructions of the cervical spine were also generated.  RADIATION DOSE REDUCTION: This exam was performed according to the departmental dose-optimization program which includes automated exposure control, adjustment of the mA and/or kV according to patient size and/or use of iterative reconstruction technique.  COMPARISON:  02/03/2020  FINDINGS: CT HEAD FINDINGS  Brain: No evidence of acute infarction, hemorrhage, hydrocephalus, extra-axial collection or mass lesion/mass effect. Mild atrophic changes and chronic white matter ischemic changes are again identified.  Vascular: No hyperdense vessel or unexpected calcification.  Skull: Normal. Negative for fracture or focal lesion.  Sinuses/Orbits: No acute finding.  Other: None.  CT CERVICAL SPINE FINDINGS  Alignment: Within normal limits.  Skull base and vertebrae: 7 cervical segments are well visualized. Vertebral body height is well maintained. Multilevel osteophytic changes and facet hypertrophic changes are seen.  No acute fracture is noted. No acute facet abnormality is seen. The odontoid is within normal limits.  Soft  tissues and spinal canal: Surrounding soft tissue structures are within normal limits.  Upper chest: Mild emphysematous changes are noted in the lung apices.  Other: None  IMPRESSION: CT of the head: Chronic atrophic and ischemic changes without acute abnormality.  CT of the cervical spine: Multilevel degenerative change without acute bony abnormality.  Electronically Signed   By: Alcide Clever M.D.   On: 05/10/2022 09:52 CT HEAD WO CONTRAST ( ) CLINICAL DATA:  Headaches and neck pain, initial encounter  EXAM: CT HEAD WITHOUT CONTRAST  CT CERVICAL SPINE WITHOUT CONTRAST  TECHNIQUE: Multidetector CT imaging of the head and cervical spine was performed following the standard protocol without intravenous contrast. Multiplanar CT image reconstructions of the cervical spine were also generated.  RADIATION DOSE REDUCTION: This exam was performed according to the departmental dose-optimization program which includes automated exposure control, adjustment of the mA and/or kV according to patient size and/or use of iterative reconstruction technique.  COMPARISON:  02/03/2020  FINDINGS: CT HEAD FINDINGS  Brain: No evidence of acute infarction, hemorrhage, hydrocephalus, extra-axial collection or mass lesion/mass effect. Mild atrophic changes and chronic white matter ischemic changes are again identified.  Vascular: No hyperdense vessel or unexpected calcification.  Skull: Normal. Negative for fracture or focal lesion.  Sinuses/Orbits: No acute finding.  Other: None.  CT CERVICAL SPINE FINDINGS  Alignment: Within normal limits.  Skull base and vertebrae: 7 cervical segments are well visualized. Vertebral body height is well maintained. Multilevel osteophytic changes and facet hypertrophic changes are seen. No acute fracture is noted. No acute facet abnormality is seen. The odontoid is within normal limits.  Soft tissues and spinal canal: Surrounding soft tissue  structures are within normal limits.  Upper chest: Mild emphysematous changes are noted in the lung apices.  Other: None  IMPRESSION: CT of the head: Chronic atrophic and ischemic changes without acute abnormality.  CT of the cervical spine: Multilevel degenerative change without acute bony abnormality.  Electronically Signed   By: Alcide Clever M.D.   On: 05/10/2022 09:52 DG Chest Port 1 View CLINICAL DATA:  Chest pain and nausea  EXAM: PORTABLE CHEST 1 VIEW  COMPARISON:  Chest radiograph 05/02/2022, CTA chest 04/11/2022  FINDINGS: The cardiomediastinal silhouette is normal  There is no focal consolidation or pulmonary edema. There is no pleural effusion or pneumothorax  There is no acute osseous abnormality.  IMPRESSION: Stable chest with no radiographic evidence of acute cardiopulmonary process.  Electronically Signed   By: Lesia Hausen M.D.   On: 05/10/2022 08:07  Lab Results  Component Value Date   WBC 9.6 05/10/2022   HGB 8.8 (L) 05/10/2022   HCT 27.3 (L) 05/10/2022   MCV 101.1 (H) 05/10/2022   PLT 193 05/10/2022   Last metabolic panel Lab Results  Component Value Date   GLUCOSE 144 (H) 05/10/2022   NA 133 (L) 05/10/2022   K 3.4 (L) 05/10/2022   CL 105 05/10/2022   CO2 22 05/10/2022   BUN 11 05/10/2022   CREATININE 1.50 (H) 05/10/2022   GFRNONAA 51 (L) 05/10/2022   CALCIUM 8.4 (L) 05/10/2022   PHOS 2.4 (L) 12/30/2020   PROT 7.0 05/10/2022   ALBUMIN 1.8 (L) 05/10/2022   BILITOT 3.9 (H) 05/10/2022   ALKPHOS 96 05/10/2022   AST 53 (H) 05/10/2022   ALT 15 05/10/2022   ANIONGAP 6 05/10/2022    Assessment and Plan: * Ascites  Moderate ascites in setting of alcoholic cirrhotic disease  Pending paracentesis by IR  Fluid studies pending  + abd pain w/ palpation Will cover w/ rocephin for SBP prophylaxis  Follow    Alcoholic cirrhosis of liver with ascites + cirrhosis on imaging w/ moderate diffuse ascites pending paracentesis  Long  standing ETOH abuse  AST 53, ALT 15, T bili 3.9, INR 1.9, Cr 1.5  MELD score 26  Will start on spironolactone and lasix regimen as BP allows  Start low dose BB as hemodynamics permit  IV PPI GI consult as clinically indicated    Abdominal pain Generalized abdominal pain on presentation Suspect multifactorial in the setting of ascites,?  SBO, chronic pancreatitis Pending paracentesis Will place on IV Rocephin for SBP prophylaxis Blood, urine, ascitic fluid cultures Lipase stable with chronic pancreatitis on imaging N.p.o. Follow    Alcohol abuse Roughly 1 pint of liquor as well as 1-2 beers daily EtOH level pending UDS pending Start CIWA protocol Thiamine, folate, multivitamin Follow  Anemia Hemoglobin 8.9 today with baseline hemoglobin around 9-12 No reported active bleeding Will check anemia panel Check Hemoccult Follow  Chronic pancreatitis Chronic pancreatitis on imaging Lipase 46 Monitor  SBO (small bowel obstruction) Noted generalized abdominal pain in the setting of ascites as well as imaging concerning for possible small bowel obstruction Positive bowel movement within the past 12 hours Will monitor for now Keep n.p.o. Formal general surgery consult as clinically indicated Follow  (HFpEF) heart failure with preserved ejection fraction 2D echo 10/2018 with EF 60-65% Euvolemic on exam Monitor with paracentesis Follow  Essential hypertension BP stable  Titrate regimen    Tobacco abuse 1 pack/day smoker Discussed cessation Nicotine patch  AKI (acute kidney injury) Cr 1.5  Cr range has been 1-2 over past 1 month  Unclear of baseline  Suspect element of poor renal perfusion in setting of ascites Will monitor w/ treatment  Check FeNa  Follow   Atrial fibrillation with rapid ventricular response EKG was sinus tachycardia today Does not appear to be on medical treatment Follow      Advance Care Planning:   Code Status: Full Code    Consults: none   Family Communication: No family at the bedside   Greater than 50% was spent in counseling and coordination of care with patient Total encounter time 80 minutes or more   Severity of Illness: The appropriate patient status for this patient is OBSERVATION. Observation status is judged to be reasonable and necessary in order to provide the required intensity of service to ensure the patient's safety. The patient's presenting symptoms, physical exam findings, and initial radiographic and laboratory data in the context of their medical condition is felt to place them at decreased risk for further clinical deterioration. Furthermore, it is anticipated that the patient will be medically stable for discharge from the hospital within 2 midnights of admission.   Author: Floydene Flock, MD 05/10/2022 12:11 PM  For on call review [email or web address].

## 2022-05-10 NOTE — ED Triage Notes (Signed)
Pt requested to use restroom before EKG and triage

## 2022-05-10 NOTE — Progress Notes (Addendum)
CT abdomen pelvis report of?  SBO discussed with on-call general surgeon Dr. Claudine Mouton Unclear if this is true bowel obstruction based on imaging Encouraging p.o. challenge Will start patient on full liquid diet Otherwise monitor Reassess if there is any decline from an abdominal/gastrointestinal standpoint or if patient cannot tolerate diet. Follow

## 2022-05-10 NOTE — Assessment & Plan Note (Signed)
Roughly 1 pint of liquor as well as 1-2 beers daily EtOH level pending UDS pending Start CIWA protocol Thiamine, folate, multivitamin Follow

## 2022-05-10 NOTE — Assessment & Plan Note (Addendum)
Creatinine peaked at 2.05 on 5/3.  Last creatinine 1.31.

## 2022-05-10 NOTE — Assessment & Plan Note (Deleted)
Moderate ascites in setting of alcoholic cirrhotic disease  Pending paracentesis by IR  Fluid studies pending  + abd pain w/ palpation Will cover w/ rocephin for SBP prophylaxis  Follow

## 2022-05-10 NOTE — ED Triage Notes (Signed)
Pt to ED for chest pain and shob "for awhile". +nausea. Pt in NAD. Last ETOH 0530 this morning, daily drinker.

## 2022-05-10 NOTE — Assessment & Plan Note (Signed)
Hemoglobin 8.9 today with baseline hemoglobin around 9-12 No reported active bleeding Will check anemia panel Check Hemoccult Follow

## 2022-05-10 NOTE — Assessment & Plan Note (Signed)
2D echo 10/2018 with EF 60-65% Euvolemic on exam Monitor with paracentesis Follow

## 2022-05-10 NOTE — Assessment & Plan Note (Addendum)
Moderate ascites in setting of alcoholic cirrhotic disease  Pending paracentesis by IR  Fluid studies pending  + abd pain w/ palpation Will cover w/ rocephin for SBP prophylaxis  Follow   

## 2022-05-11 ENCOUNTER — Observation Stay: Payer: 59

## 2022-05-11 ENCOUNTER — Inpatient Hospital Stay: Payer: 59

## 2022-05-11 DIAGNOSIS — K852 Alcohol induced acute pancreatitis without necrosis or infection: Secondary | ICD-10-CM | POA: Diagnosis present

## 2022-05-11 DIAGNOSIS — F05 Delirium due to known physiological condition: Secondary | ICD-10-CM | POA: Diagnosis not present

## 2022-05-11 DIAGNOSIS — I480 Paroxysmal atrial fibrillation: Secondary | ICD-10-CM | POA: Diagnosis present

## 2022-05-11 DIAGNOSIS — F1020 Alcohol dependence, uncomplicated: Secondary | ICD-10-CM | POA: Diagnosis present

## 2022-05-11 DIAGNOSIS — E871 Hypo-osmolality and hyponatremia: Secondary | ICD-10-CM | POA: Diagnosis present

## 2022-05-11 DIAGNOSIS — G9341 Metabolic encephalopathy: Secondary | ICD-10-CM | POA: Diagnosis not present

## 2022-05-11 DIAGNOSIS — N179 Acute kidney failure, unspecified: Secondary | ICD-10-CM | POA: Diagnosis not present

## 2022-05-11 DIAGNOSIS — R64 Cachexia: Secondary | ICD-10-CM | POA: Diagnosis present

## 2022-05-11 DIAGNOSIS — R1084 Generalized abdominal pain: Secondary | ICD-10-CM | POA: Diagnosis not present

## 2022-05-11 DIAGNOSIS — D689 Coagulation defect, unspecified: Secondary | ICD-10-CM | POA: Diagnosis not present

## 2022-05-11 DIAGNOSIS — K7031 Alcoholic cirrhosis of liver with ascites: Principal | ICD-10-CM

## 2022-05-11 DIAGNOSIS — K7682 Hepatic encephalopathy: Secondary | ICD-10-CM | POA: Diagnosis present

## 2022-05-11 DIAGNOSIS — A419 Sepsis, unspecified organism: Secondary | ICD-10-CM | POA: Diagnosis not present

## 2022-05-11 DIAGNOSIS — R109 Unspecified abdominal pain: Secondary | ICD-10-CM | POA: Diagnosis not present

## 2022-05-11 DIAGNOSIS — R140 Abdominal distension (gaseous): Secondary | ICD-10-CM | POA: Diagnosis not present

## 2022-05-11 DIAGNOSIS — K7460 Unspecified cirrhosis of liver: Secondary | ICD-10-CM | POA: Diagnosis not present

## 2022-05-11 DIAGNOSIS — I10 Essential (primary) hypertension: Secondary | ICD-10-CM | POA: Diagnosis present

## 2022-05-11 DIAGNOSIS — Z8616 Personal history of COVID-19: Secondary | ICD-10-CM | POA: Diagnosis not present

## 2022-05-11 DIAGNOSIS — R188 Other ascites: Secondary | ICD-10-CM | POA: Diagnosis not present

## 2022-05-11 DIAGNOSIS — J690 Pneumonitis due to inhalation of food and vomit: Secondary | ICD-10-CM | POA: Diagnosis not present

## 2022-05-11 DIAGNOSIS — K652 Spontaneous bacterial peritonitis: Secondary | ICD-10-CM | POA: Diagnosis present

## 2022-05-11 DIAGNOSIS — Z681 Body mass index (BMI) 19 or less, adult: Secondary | ICD-10-CM | POA: Diagnosis not present

## 2022-05-11 DIAGNOSIS — K860 Alcohol-induced chronic pancreatitis: Secondary | ICD-10-CM | POA: Diagnosis present

## 2022-05-11 DIAGNOSIS — J440 Chronic obstructive pulmonary disease with acute lower respiratory infection: Secondary | ICD-10-CM | POA: Diagnosis present

## 2022-05-11 DIAGNOSIS — Z515 Encounter for palliative care: Secondary | ICD-10-CM | POA: Diagnosis not present

## 2022-05-11 DIAGNOSIS — Z66 Do not resuscitate: Secondary | ICD-10-CM | POA: Diagnosis not present

## 2022-05-11 DIAGNOSIS — E870 Hyperosmolality and hypernatremia: Secondary | ICD-10-CM | POA: Diagnosis not present

## 2022-05-11 DIAGNOSIS — R6520 Severe sepsis without septic shock: Secondary | ICD-10-CM | POA: Diagnosis not present

## 2022-05-11 DIAGNOSIS — E43 Unspecified severe protein-calorie malnutrition: Secondary | ICD-10-CM | POA: Diagnosis present

## 2022-05-11 DIAGNOSIS — F1010 Alcohol abuse, uncomplicated: Secondary | ICD-10-CM | POA: Diagnosis not present

## 2022-05-11 DIAGNOSIS — K861 Other chronic pancreatitis: Secondary | ICD-10-CM | POA: Diagnosis not present

## 2022-05-11 LAB — BODY FLUID CELL COUNT WITH DIFFERENTIAL
Eos, Fluid: 0 %
Lymphs, Fluid: 10 %
Monocyte-Macrophage-Serous Fluid: 44 %
Neutrophil Count, Fluid: 46 %
Total Nucleated Cell Count, Fluid: 407 cu mm

## 2022-05-11 LAB — COMPREHENSIVE METABOLIC PANEL
ALT: 10 U/L (ref 0–44)
AST: 41 U/L (ref 15–41)
Albumin: 2.7 g/dL — ABNORMAL LOW (ref 3.5–5.0)
Alkaline Phosphatase: 54 U/L (ref 38–126)
Anion gap: 9 (ref 5–15)
BUN: 13 mg/dL (ref 8–23)
CO2: 20 mmol/L — ABNORMAL LOW (ref 22–32)
Calcium: 8.6 mg/dL — ABNORMAL LOW (ref 8.9–10.3)
Chloride: 107 mmol/L (ref 98–111)
Creatinine, Ser: 1.56 mg/dL — ABNORMAL HIGH (ref 0.61–1.24)
GFR, Estimated: 49 mL/min — ABNORMAL LOW (ref >60)
Glucose, Bld: 136 mg/dL — ABNORMAL HIGH (ref 70–99)
Potassium: 3.6 mmol/L (ref 3.5–5.1)
Sodium: 136 mmol/L (ref 135–145)
Total Bilirubin: 4.3 mg/dL — ABNORMAL HIGH (ref 0.3–1.2)
Total Protein: 5.9 g/dL — ABNORMAL LOW (ref 6.5–8.1)

## 2022-05-11 LAB — ABO/RH: ABO/RH(D): B POS

## 2022-05-11 LAB — ALBUMIN, PLEURAL OR PERITONEAL FLUID: Albumin, Fluid: <1.5 g/dL

## 2022-05-11 LAB — PROTEIN, PLEURAL OR PERITONEAL FLUID: Total protein, fluid: <3 g/dL

## 2022-05-11 LAB — LACTIC ACID, PLASMA
Lactic Acid, Venous: 1.9 mmol/L (ref 0.5–1.9)
Lactic Acid, Venous: 1.8 mmol/L (ref 0.5–1.9)

## 2022-05-11 LAB — BPAM RBC: Blood Product Expiration Date: 202405102359

## 2022-05-11 LAB — CBC
HCT: 20.7 % — ABNORMAL LOW (ref 39.0–52.0)
Hemoglobin: 6.7 g/dL — ABNORMAL LOW (ref 13.0–17.0)
MCH: 32.4 pg (ref 26.0–34.0)
MCHC: 32.4 g/dL (ref 30.0–36.0)
MCV: 100 fL (ref 80.0–100.0)
Platelets: 137 10*3/uL — ABNORMAL LOW (ref 150–400)
RBC: 2.07 MIL/uL — ABNORMAL LOW (ref 4.22–5.81)
RDW: 15.6 % — ABNORMAL HIGH (ref 11.5–15.5)
WBC: 9 10*3/uL (ref 4.0–10.5)
nRBC: 0 % (ref 0.0–0.2)

## 2022-05-11 LAB — PREPARE RBC (CROSSMATCH)

## 2022-05-11 LAB — GLUCOSE, CAPILLARY
Glucose-Capillary: 128 mg/dL — ABNORMAL HIGH (ref 70–99)
Glucose-Capillary: 110 mg/dL — ABNORMAL HIGH (ref 70–99)

## 2022-05-11 LAB — PATHOLOGIST SMEAR REVIEW

## 2022-05-11 LAB — CULTURE, BLOOD (ROUTINE X 2)
Culture: NO GROWTH
Special Requests: ADEQUATE

## 2022-05-11 LAB — TYPE AND SCREEN

## 2022-05-11 MED ORDER — MIDODRINE HCL 5 MG PO TABS
10.0000 mg | ORAL_TABLET | Freq: Three times a day (TID) | ORAL | Status: DC
  Administered 2022-05-11 – 2022-05-14 (×9): 10 mg via ORAL
  Filled 2022-05-11 (×12): qty 2

## 2022-05-11 MED ORDER — IBUPROFEN 400 MG PO TABS
400.0000 mg | ORAL_TABLET | Freq: Once | ORAL | Status: AC | PRN
  Administered 2022-05-11: 400 mg via ORAL
  Filled 2022-05-11: qty 1

## 2022-05-11 MED ORDER — IPRATROPIUM-ALBUTEROL 0.5-2.5 (3) MG/3ML IN SOLN: 3.0000 mL | RESPIRATORY_TRACT | Status: DC | PRN

## 2022-05-11 MED ORDER — MORPHINE SULFATE (PF) 2 MG/ML IV SOLN
1.0000 mg | Freq: Once | INTRAVENOUS | Status: AC
  Administered 2022-05-11: 1 mg via INTRAVENOUS
  Filled 2022-05-11: qty 1

## 2022-05-11 MED ORDER — LIDOCAINE HCL (PF) 1 % IJ SOLN
10.0000 mL | Freq: Once | INTRAMUSCULAR | Status: AC
  Administered 2022-05-11: 10 mL via SUBCUTANEOUS
  Filled 2022-05-11: qty 10

## 2022-05-11 MED ORDER — SODIUM CHLORIDE 0.9% IV SOLUTION: Freq: Once | INTRAVENOUS | Status: AC

## 2022-05-11 MED ORDER — FUROSEMIDE 10 MG/ML IJ SOLN: 40.0000 mg | Freq: Once | INTRAMUSCULAR | Status: DC

## 2022-05-11 MED ORDER — ALBUMIN HUMAN 25 % IV SOLN: 40.0000 g | Freq: Once | INTRAVENOUS | Status: DC | PRN

## 2022-05-11 MED ORDER — MIDODRINE HCL 5 MG PO TABS
5.0000 mg | ORAL_TABLET | Freq: Once | ORAL | Status: AC
  Administered 2022-05-11: 5 mg via ORAL
  Filled 2022-05-11: qty 1

## 2022-05-11 NOTE — Consult Note (Signed)
[facility] SURGICAL CONSULTATION NOTE (initial) - cpt: 09811   HISTORY OF PRESENT ILLNESS (HPI):  67 y.o. male presented to [facility] ED yesterday for evaluation of chest pain, abdominal pain, nausea, and SOB. Seems he has numerous presentations for this in the past month alone. Patient reported chronic upper abdominal pain and distension with associated nausea. He feels this is also making it more difficult to breath. No fever, chills, emesis. He is having bowel function. He does have a history of cirrhosis and continues to drink but "not as much." He does have history of partial colectomy. Work up in the ED revealed normal WBC at 9.6K (now 9.0K), Hgb to 8.8 (now 6.7), sCr - 1.50 (baseline), hypokalemia to 3.4, hypoalbuminemia to 1.8, hyperbilirubinemia to 3.9 (primarily indirect), BNP 117. He did also have CT Abdomen/Pelvis concerning for changes consistent with cirrhosis, there was also mild increase in bowel caliber without gross transition. He was ultimately admitted to the medicine service.   Surgery is consulted by hospitalist physician Dr. Doree Albee, MD in this context for evaluation and management of possible SBO.  PAST MEDICAL HISTORY (PMH):  Past Medical History:  Diagnosis Date   Acute pancreatitis    Alcohol use    CHF (congestive heart failure)    COPD (chronic obstructive pulmonary disease)    Coronary artery disease    COVID-19 virus infection    positive COVID-19 test on 01/22/20   Hypertension    MI (myocardial infarction)    Paroxysmal atrial fibrillation      PAST SURGICAL HISTORY (PSH):  Past Surgical History:  Procedure Laterality Date   ANTERIOR VITRECTOMY Right 10/04/2020   Procedure: ANTERIOR VITRECTOMY;  Surgeon: Nevada Crane, MD;  Location: [facility];  Service: Ophthalmology;  Laterality: Right;   CATARACT EXTRACTION W/PHACO Right 10/04/2020   Procedure: CATARACT EXTRACTION PHACO AND INTRAOCULAR LENS PLACEMENT (IOC) RIGHT VISION  BLUE, CAPSULAR TENSION RING 14A  RIGHT 21.84 01:44.7;  Surgeon: Nevada Crane, MD;  Location: [facility];  Service: Ophthalmology;  Laterality: Right;   CATARACT EXTRACTION W/PHACO Left 10/18/2020   Procedure: CATARACT EXTRACTION PHACO AND INTRAOCULAR LENS PLACEMENT (IOC) LEFT 2.95 00:23.9;  Surgeon: Nevada Crane, MD;  Location: [facility];  Service: Ophthalmology;  Laterality: Left;   PARTIAL COLECTOMY       MEDICATIONS:  Prior to Admission medications   Medication Sig Start Date End Date Taking? Authorizing Provider  furosemide (LASIX) 40 MG tablet Take 1 tablet (40 mg total) by mouth daily. 03/31/22  Yes Hackney, Inetta Fermo A, FNP  ondansetron (ZOFRAN-ODT) 4 MG disintegrating tablet Take 1 tablet (4 mg total) by mouth every 8 (eight) hours as needed for nausea or vomiting. 04/19/22  Yes Mumma, Carollee Herter, MD  potassium chloride SA (KLOR-CON M) 20 MEQ tablet Take 1 tablet (20 mEq total) by mouth daily. 03/31/22  Yes Clarisa Kindred A, FNP  spironolactone (ALDACTONE) 25 MG tablet Take 0.5 tablets (12.5 mg total) by mouth daily. 03/31/22  Yes Clarisa Kindred A, FNP  Potassium Chloride ER 20 MEQ TBCR Take 1 tablet by mouth daily. Patient not taking: Reported on 05/10/2022 12/12/21   [provider]     ALLERGIES:  No Known Allergies   SOCIAL HISTORY:  Social History   Socioeconomic History   Marital status: Divorced    Spouse name: Not on file   Number of children: Not on file   Years of education: Not on file   Highest education level: Not on file  Occupational  History   Not on file  Tobacco Use   Smoking status: Every Day    Packs/day: 1.00    Years: 30.00    Additional pack years: 0.00    Total pack years: 30.00    Types: Cigarettes   Smokeless tobacco: Never  Substance and Sexual Activity   Alcohol use: Yes    Alcohol/week: 6.0 standard drinks of alcohol    Types: 6 Cans of beer per week    Comment: daily   Drug use: Yes    Types: Marijuana   Sexual  activity: Not on file  Other Topics Concern   Not on file  Social History Narrative   Not on file   Social Determinants of Health   Financial Resource Strain: Not on file  Food Insecurity: No Food Insecurity (05/10/2022)   Hunger Vital Sign    Worried About Running Out of Food in the Last Year: Never true    Ran Out of Food in the Last Year: Never true  Transportation Needs: No Transportation Needs (05/10/2022)   PRAPARE - Administrator, Civil Service (Medical): No    Lack of Transportation (Non-Medical): No  Physical Activity: Not on file  Stress: Not on file  Social Connections: Not on file  Intimate Partner Violence: Not At Risk (05/10/2022)   Humiliation, Afraid, Rape, and Kick questionnaire    Fear of Current or Ex-Partner: No    Emotionally Abused: No    Physically Abused: No    Sexually Abused: No     FAMILY HISTORY:  Family History  Problem Relation Age of Onset   Cancer Mother    Kidney disease Brother       REVIEW OF SYSTEMS:  Review of Systems  Constitutional:  Negative for chills and fever.  Respiratory:  Positive for shortness of breath. Negative for cough.   Cardiovascular:  Positive for chest pain. Negative for palpitations.  Gastrointestinal:  Positive for abdominal pain and nausea. Negative for constipation, diarrhea and vomiting.  All other systems reviewed and are negative.   VITAL SIGNS:  Temp:  [97.6 F (36.4 C)-98.7 F (37.1 C)] 98.6 F (37 C) (04/25 0634) Pulse Rate:  [79-122] 114 (04/25 0634) Resp:  [14-20] 18 (04/25 0634) BP: (85-105)/(53-79) 89/56 (04/25 0634) SpO2:  [96 %-100 %] 100 % (04/25 0634) Weight:  [58.8 kg] 58.8 kg (04/24 1200)     Height: 5\' 9"  (175.3 cm) Weight: 58.8 kg BMI (Calculated): 19.13   INTAKE/OUTPUT:  04/24 0701 - 04/25 0700 In: 283.3 [IV Piggyback:283.3] Out: 200 [Urine:200]  PHYSICAL EXAM:  Physical Exam Vitals and nursing note reviewed.  Constitutional:      General: He is not in acute  distress.    Appearance: He is cachectic. He is not ill-appearing.     Comments: Patient alert, appears cachetic  HENT:     Head: Normocephalic and atraumatic.  Eyes:     General: Scleral icterus present.     Conjunctiva/sclera: Conjunctivae normal.  Cardiovascular:     Rate and Rhythm: Regular rhythm. Tachycardia present.     Pulses: Normal pulses.     Heart sounds: No murmur heard. Pulmonary:     Effort: Pulmonary effort is normal. No respiratory distress.  Abdominal:     General: Abdomen is protuberant. A surgical scar is present. There is distension.     Palpations: Abdomen is soft.     Tenderness: There is no abdominal tenderness.     Hernia: A hernia is present. Hernia is  present in the ventral area.     Comments: Abdomen is markedly distended consistent with ascites, he does not appear overtly tender, no rebound/guarding. He is certainly without peritonitis   Genitourinary:    Comments: Deferred Skin:    General: Skin is warm and dry.  Neurological:     General: No focal deficit present.     Mental Status: He is alert and oriented to person, place, and time.  Psychiatric:        Mood and Affect: Mood normal.        Behavior: Behavior normal. Behavior is cooperative.      Labs:     Latest Ref Rng & Units 05/11/2022    4:07 AM 05/10/2022    8:36 AM 05/02/2022   12:36 PM  CBC  WBC 4.0 - 10.5 K/uL 9.0  9.6  11.1   Hemoglobin 13.0 - 17.0 g/dL 6.7  8.8  9.6   Hematocrit 39.0 - 52.0 % 20.7  27.3  29.1   Platelets 150 - 400 K/uL 137  193  203       Latest Ref Rng & Units 05/11/2022    4:07 AM 05/10/2022    8:36 AM 05/02/2022    4:57 PM  CMP  Glucose 70 - 99 mg/dL 161  096    BUN 8 - 23 mg/dL 13  11    Creatinine 0.45 - 1.24 mg/dL 4.09  8.11    Sodium 914 - 145 mmol/L 136  133    Potassium 3.5 - 5.1 mmol/L 3.6  3.4    Chloride 98 - 111 mmol/L 107  105    CO2 22 - 32 mmol/L 20  22    Calcium 8.9 - 10.3 mg/dL 8.6  8.4    Total Protein 6.5 - 8.1 g/dL 5.9  7.0  7.4    Total Bilirubin 0.3 - 1.2 mg/dL 4.3  3.9  3.2   Alkaline Phos 38 - 126 U/L 54  96  159   AST 15 - 41 U/L 41  53  72   ALT 0 - 44 U/L Imaging studies:   CT Abdomen/Pelvis (05/10/2022) personally reviewed showing cirrhotic appearance to liver, ascites, there is slight dilation to small bowel but no transition point, and radiologist report reviewed below:  IMPRESSION: 1. No evidence for acute pulmonary embolus. 2. Morphologic features of the liver compatible with cirrhosis. 3. Moderate diffuse abdominal ascites, similar in volume to the previous exam. 4. Mild increase caliber of the mid small bowel loops with transition to decreased caliber distal small bowel loops noted within the lower abdomen/pelvis. Findings may reflect partial small bowel obstruction versus ileus versus enteritis. 5. Mild wall thickening involving the descending colon is nonspecific and may reflect hepatic colopathy. 6. Chronic pancreatitis with several cystic lesions within the head and neck of pancreas. The largest is in the neck measuring 1.6 cm. Recommend follow-up pancreatic protocol CT or MRI in 12 months. 7. Gallstones. 8.  Aortic Atherosclerosis (ICD10-I70.0).   Assessment/Plan: (ICD-10's: K25.609) 67 y.o. male with abdominal pain and distension most certainly secondary to cirrhosis/ascites found to have mild dilation of small bowel without transition and with continued bowel function.   - Appreciate medicine admission  - I do not think he has a small bowel obstruction given his continued bowel function and lack of gross transition zone on imaging. I do think the changes seen on imaging are sequela of his cirrhosis/ascites. He is Marjo Bicker  C. He is certainly not an operative candidate regardless.   - I do not think he warrants NGT decompression  - Okay to trial FLD and advance as tolerated  - Monitor abdominal examination; on-going bowel function  - Pain control prn; antiemetics prn   -  Mobilize as tolerated   - Further management per primary service. Nothing to offer from surgical persective, we will remain available. Please call with questions/concerns.   All of the above findings and recommendations were discussed with the patient, and all of patient's questions were answered to his expressed satisfaction.  Thank you for the opportunity to participate in this patient's care.   -- Lynden Oxford, PA-C [facility] 05/11/2022, 7:44 AM M-F: 7am - 4pm

## 2022-05-11 NOTE — Progress Notes (Signed)
PROGRESS NOTE    Thomas Coffey  WUJ:[id number] DOB: 09-10-1955 DOA: 05/10/2022 PCP: Pcp, No  150A/150A-AA  LOS: 0 days   Brief hospital course:   Assessment & Plan: Thomas Coffey is a 67 y.o. male with medical history significant of multiple medical issues including heavy alcohol use, substance abuse, CHF, COPD, hypertension, coronary artery disease, atrial fibrillation presenting with abdominal pain, ascites, cirrhosis, small bowel obstruction, AKI.  Patient reports worsening abdominal pain for several weeks.  Patient is known to be seen at least 4-5 times in the ER over the past month.  Baseline heavy alcohol use.  Drinks at least 1 pint a day.  Also with at least 1-2 beers.  Patient states has been trying to decrease his use.  1 pack/day smoker.  Occasional marijuana use.    * Ascites Moderate ascites in setting of alcoholic cirrhotic disease  --paracentesis today with 2L removed.  Fluid study neg for SBP --d/c ceftriaxone  Alcoholic cirrhosis of liver with ascites (HCC) + cirrhosis on imaging w/ moderate diffuse ascites pending paracentesis  Long standing ETOH abuse  AST 53, ALT 15, T bili 3.9, INR 1.9, Cr 1.5  MELD score 26  Will start on spironolactone and lasix regimen as BP allows  Start low dose BB as hemodynamics permit   SBO, ruled out --GenSurg consulted on admission, ruled out SBO  Abdominal pain Generalized abdominal pain on presentation Suspect multifactorial in the setting of ascites, chronic pancreatitis --avoid opioids pain med in the setting of low BP  Hypotension --BP 80's --start midodrine 10 mg TID  Alcohol abuse Roughly 1 pint of liquor as well as 1-2 beers daily --CIWA protocol Thiamine, folate, multivitamin  Anemia  Hemoglobin 8.9 today with baseline hemoglobin around 9-12 --anemia workup neg for significant def  Chronic pancreatitis (HCC) Chronic pancreatitis on imaging Lipase 46  (HFpEF) heart failure with preserved ejection  fraction 2D echo 10/2018 with EF 60-65% Euvolemic on exam  Essential hypertension, not currently active --BP low  Tobacco abuse 1 pack/day smoker Discussed cessation with admitting physician Nicotine patch  AKI, ruled out Cr 1.5 on presentation.  This is his recent baseline.  Atrial fibrillation with rapid ventricular response (HCC) EKG was sinus tachycardia on presentation Does not appear to be on medical treatment   DVT prophylaxis: Lovenox SQ Code Status: Full code  Family Communication:  Level of care: Med-Surg Dispo:   The patient is from: home Anticipated d/c is to: home Anticipated d/c date is: 1-2 days   Subjective and Interval History:  Pt complained of abdominal pain.  Received paracentesis today with 2L removed.   Objective: Vitals:   05/11/22 0844 05/11/22 1048 05/11/22 1213 05/11/22 1348  BP: (!) 90/58 (!) 86/64 (!) 85/48 (!) 81/55  Pulse: (!) 111 98 100   Resp: Temp: 98.8 F (37.1 C) 98.1 F (36.7 C) 98.5 F (36.9 C)   TempSrc: Oral Oral Oral   SpO2: 97% 98% 96% 98%  Weight:      Height:        Intake/Output Summary (Last 24 hours) at 05/11/2022 1758 Last data filed at 05/11/2022 0840 Gross per 24 hour  Intake 589.59 ml  Output --  Net 589.59 ml   Filed Weights   05/10/22 0741 05/10/22 1200  Weight: 59.1 kg 58.8 kg    Examination:   Constitutional: NAD CV: No cyanosis.   RESP: normal respiratory effort, on RA Abdomen: rounded SKIN: warm, dry Neuro: II - XII  grossly intact.     Data Reviewed: I have personally reviewed labs and imaging studies  Time spent: 50 minutes  Darlin Priestly, MD [facility] If 7PM-7AM, please contact night-coverage 05/11/2022, 5:58 PM

## 2022-05-11 NOTE — Progress Notes (Signed)
       CROSS COVER NOTE  NAME: Thomas Coffey MRN: [id number] DOB : 11/11/1955    HPI/Events of Note   Report:abdominal pain 7/10 in patient with cirrhosis with ascites who was suppose to have paracentesis that did not get completed  On review of chart:History of chronic pancreatitiis, ongoing alcohol user, CHF, COPD HTN, CAD, a fib who presented to ER with acute abdominal pain, ascites, SBO and AKI Appears 50 gm albumin given for BP support 4/24 prior to shift.     Assessment and  Interventions   Assessment: Abdomen extremely distended and firm Reports pain as generalized, sometimes stabbing but was non specific.   MAP remains 63-65 heart rate 122 Lactic 1.9  Plan: Dose of midodrine was given but without positive improvement i blood pressure Morphine 1 mg for pain ordered - hold other sedating meds Hgb this am 6.7 - packed red blood cell transfusion benefits/risks discussed with patient and patient agreed to transfusion. 1 unit ordered       Donnie Mesa NP [facility]

## 2022-05-11 NOTE — TOC Progression Note (Signed)
Transition of Care [facility]) - Progression Note    Patient Details  Name: QUINTAVIS BRANDS MRN: [id number] Date of Birth: 11-Jul-1955  Transition of [facility]) CM/SW Contact  Marlowe Sax, RN Phone Number: 05/11/2022, 10:34 AM  Clinical Narrative:    CM acknowledges consult for Substance abuse, I attached resources to the AVS to print and be provided to the patient at DC   Expected Discharge Plan: Home/Self Care Barriers to Discharge: Continued Medical Work up  Expected Discharge Plan and Services                                               Social Determinants of Health (SDOH) Interventions SDOH Screenings   Food Insecurity: No Food Insecurity (05/10/2022)  Housing: Low Risk  (05/10/2022)  Transportation Needs: No Transportation Needs (05/10/2022)  Utilities: Not At Risk (05/10/2022)  Tobacco Use: High Risk (05/10/2022)    Readmission Risk Interventions    12/30/2020   11:53 AM  Readmission Risk Prevention Plan  HRI or Home Care Consult Patient refused  Social Work Consult for Recovery Care Planning/Counseling Complete  Palliative Care Screening Not Applicable  Medication Review Oceanographer) Complete

## 2022-05-11 NOTE — Procedures (Signed)
PROCEDURE SUMMARY:  Successful US guided paracentesis from LLQ. Yielded 2 L of amber colored fluid.  No immediate complications.  Pt tolerated well.   Specimen was sent for labs.  EBL < 5mL  Cloretta Ned 05/11/2022 2:41 PM

## 2022-05-11 NOTE — Discharge Instructions (Signed)
                  Intensive Outpatient Programs  [facility]    [facility] [address]     [address] [place],  [place]     [place], [place] [phone]      [phone]  [facility]   [facility]  (Inpatient and outpatient)  [phone] (Suboxone and Methadone) [address]           [phone]           [facility]    [facility] - Intensive Outpatient [address]     [address] [place], [place] [address]     [place], [place]  [phone]      [phone]  [facility] (Outpatient, Inpatient, [facility]  [facility] (Groups and Residental) (insurance only) [phone]    [place], [place]          [phone]       [facility]    [facility] (for caregivers and family) [address]     [address] [place], [place]     [place], [place] [phone]      [phone]  Residential Treatment Programs  [facility]  [facility](2 years) Residential: 90 days)  [facility] [address]      [address] [place], [place]     [place], [place] [phone]      [phone] or [phone]  [facility]    [facility] [address]      [address] [place], [place]     , [place] [phone]      [phone]  [facility]   [facility] ([facility]) [address]     [address] [place], [place] [address]     [place], [place] [phone]      [phone] Admissions: 8am-3pm M-F  [facility]: Residential Program (90 Days)              [facility]: [facility]  [place], [place]     [place], [place]  [phone] or [phone]    (Walk in Hours over the weekend or by referral)   [facility]: [facility]:[phone] (for crisis  response 24 hours a day) 

## 2022-05-11 NOTE — Plan of Care (Signed)
  Problem: Health Behavior/Discharge Planning: Goal: Ability to manage health-related needs will improve Outcome: Progressing   Problem: Clinical Measurements: Goal: Ability to maintain clinical measurements within normal limits will improve Outcome: Progressing Goal: Will remain free from infection Outcome: Progressing   

## 2022-05-12 DIAGNOSIS — K7031 Alcoholic cirrhosis of liver with ascites: Secondary | ICD-10-CM | POA: Diagnosis not present

## 2022-05-12 LAB — MAGNESIUM: Magnesium: 1.8 mg/dL (ref 1.7–2.4)

## 2022-05-12 LAB — BPAM RBC
ISSUE DATE / TIME: 202404250600
Unit Type and Rh: 7300

## 2022-05-12 LAB — CBC
HCT: 23.7 % — ABNORMAL LOW (ref 39.0–52.0)
Hemoglobin: 7.8 g/dL — ABNORMAL LOW (ref 13.0–17.0)
MCH: 32 pg (ref 26.0–34.0)
MCHC: 32.9 g/dL (ref 30.0–36.0)
MCV: 97.1 fL (ref 80.0–100.0)
Platelets: 161 10*3/uL (ref 150–400)
RBC: 2.44 MIL/uL — ABNORMAL LOW (ref 4.22–5.81)
RDW: 16.7 % — ABNORMAL HIGH (ref 11.5–15.5)
WBC: 11.4 10*3/uL — ABNORMAL HIGH (ref 4.0–10.5)
nRBC: 0 % (ref 0.0–0.2)

## 2022-05-12 LAB — TYPE AND SCREEN
ABO/RH(D): B POS
Antibody Screen: NEGATIVE
Unit division: 0

## 2022-05-12 LAB — BASIC METABOLIC PANEL
Anion gap: 6 (ref 5–15)
BUN: 20 mg/dL (ref 8–23)
CO2: 23 mmol/L (ref 22–32)
Calcium: 8.5 mg/dL — ABNORMAL LOW (ref 8.9–10.3)
Chloride: 109 mmol/L (ref 98–111)
Creatinine, Ser: 1.92 mg/dL — ABNORMAL HIGH (ref 0.61–1.24)
GFR, Estimated: 38 mL/min — ABNORMAL LOW (ref >60)
Glucose, Bld: 98 mg/dL (ref 70–99)
Potassium: 3.7 mmol/L (ref 3.5–5.1)
Sodium: 138 mmol/L (ref 135–145)

## 2022-05-12 LAB — TRIGLYCERIDES, BODY FLUIDS: Triglycerides, Fluid: 33 mg/dL

## 2022-05-12 LAB — BODY FLUID CULTURE W GRAM STAIN

## 2022-05-12 LAB — CULTURE, BLOOD (ROUTINE X 2): Special Requests: ADEQUATE

## 2022-05-12 MED ORDER — DM-GUAIFENESIN ER 30-600 MG PO TB12
1.0000 | ORAL_TABLET | Freq: Two times a day (BID) | ORAL | Status: DC
  Administered 2022-05-12 – 2022-05-26 (×20): 1 via ORAL
  Filled 2022-05-12 (×33): qty 1

## 2022-05-12 MED ORDER — BROMPHENIRAMINE-PHENYLEPHRINE 2-5 MG/10ML PO LIQD: 10.0000 mL | Freq: Four times a day (QID) | ORAL | Status: DC | PRN

## 2022-05-12 NOTE — TOC Progression Note (Signed)
Transition of Care [facility]) - Progression Note    Patient Details  Name: Thomas Coffey MRN: [id number] Date of Birth: 1955-11-24  Transition of Care [facility]) CM/SW Contact  Marlowe Sax, RN Phone Number: 05/12/2022, 10:56 AM  Clinical Narrative:    TOC continues to follow the patient, CIWA protocol, Paracentesis removed 2 Liters   Expected Discharge Plan: Home/Self Care Barriers to Discharge: Continued Medical Work up  Expected Discharge Plan and Services   Discharge Planning Services: CM Consult   Living arrangements for the past 2 months: Single Family Home                                       Social Determinants of Health (SDOH) Interventions SDOH Screenings   Food Insecurity: No Food Insecurity (05/10/2022)  Housing: Low Risk  (05/10/2022)  Transportation Needs: No Transportation Needs (05/10/2022)  Utilities: Not At Risk (05/10/2022)  Tobacco Use: High Risk (05/10/2022)    Readmission Risk Interventions    12/30/2020   11:53 AM  Readmission Risk Prevention Plan  HRI or Home Care Consult Patient refused  Social Work Consult for Recovery Care Planning/Counseling Complete  Palliative Care Screening Not Applicable  Medication Review Oceanographer) Complete

## 2022-05-12 NOTE — Progress Notes (Signed)
PROGRESS NOTE    Thomas Coffey  QIO:[id number] DOB: [birth year] DOA: 05/10/2022 PCP: Pcp, No  150A/150A-AA  LOS: 1 day   Brief hospital course:   Assessment & Plan: Thomas Coffey is a 67 y.o. male with medical history significant of multiple medical issues including heavy alcohol use, substance abuse, CHF, COPD, hypertension, coronary artery disease, atrial fibrillation presenting with abdominal pain, ascites, cirrhosis, small bowel obstruction, AKI.  Patient reports worsening abdominal pain for several weeks.  Patient is known to be seen at least 4-5 times in the ER over the past month.  Baseline heavy alcohol use.  Drinks at least 1 pint a day.  Also with at least 1-2 beers.  Patient states has been trying to decrease his use.  1 pack/day smoker.  Occasional marijuana use.    * Ascites Moderate ascites in setting of alcoholic cirrhotic disease  --paracentesis on 4/25 with 2L removed.  Fluid study neg for SBP --ceftriaxone d/c'ed  Alcoholic cirrhosis of liver with ascites (HCC) + cirrhosis on imaging w/ moderate diffuse ascites pending paracentesis  Long standing ETOH abuse  AST 53, ALT 15, T bili 3.9, INR 1.9, Cr 1.5  MELD score 26  Will start on spironolactone and lasix regimen as BP allows  Start low dose BB as hemodynamics permit   SBO, ruled out --GenSurg consulted on admission, ruled out SBO  Abdominal pain Generalized abdominal pain on presentation Suspect multifactorial in the setting of ascites, chronic pancreatitis.  Improved after paracentesis. --avoid opioids pain med in the setting of low BP  Hypotension --BP 80's --cont midodrine 10 mg TID (new)  Alcohol abuse Roughly 1 pint of liquor as well as 1-2 beers daily --CIWA protocol Thiamine, folate, multivitamin  Anemia  --received 1u pRBC for Hgb 6.7.   --anemia workup neg for significant def --monitor and transfuse to keep Hgb >7  Chronic pancreatitis (HCC) Chronic pancreatitis on imaging Lipase  46  (HFpEF) heart failure with preserved ejection fraction 2D echo 10/2018 with EF 60-65% Euvolemic on exam  Essential hypertension, not currently active --BP low  Tobacco abuse 1 pack/day smoker Discussed cessation with admitting physician Nicotine patch  AKI, ruled out Cr 1.5 on presentation.  This is his recent baseline.  Atrial fibrillation with rapid ventricular response (HCC) EKG was sinus tachycardia on presentation Does not appear to be on medical treatment   DVT prophylaxis: Lovenox SQ Code Status: Full code  Family Communication:  Level of care: Med-Surg Dispo:   The patient is from: home Anticipated d/c is to: home Anticipated d/c date is: 1-2 days   Subjective and Interval History:  Pt reported abdominal pain improved.  Pt said he lost a lot of weight and needed to eat more.  BP still low, but pt asymptomatic.   Objective: Vitals:   05/11/22 2347 05/12/22 0433 05/12/22 0737 05/12/22 1223  BP: (!) 82/52 (!) 83/53 (!) 78/52 92/61  Pulse: 96 88 89 86  Resp: 18 18 18 17   Temp: 98.6 F (37 C) 98.3 F (36.8 C) 97.8 F (36.6 C) 97.7 F (36.5 C)  TempSrc:      SpO2: 94% 97% 100% 100%  Weight:      Height:        Intake/Output Summary (Last 24 hours) at 05/12/2022 1242 Last data filed at 05/12/2022 0600 Gross per 24 hour  Intake 240 ml  Output 200 ml  Net 40 ml   Filed Weights   05/10/22 0741 05/10/22 1200  Weight: 59.1 kg 58.8  kg    Examination:   Constitutional: NAD, AAOx3 HEENT: conjunctivae and lids normal, EOMI CV: No cyanosis.   RESP: normal respiratory effort, on RA Neuro: II - XII grossly intact.     Data Reviewed: I have personally reviewed labs and imaging studies  Time spent: 35 minutes  Darlin Priestly, MD [facility] If 7PM-7AM, please contact night-coverage 05/12/2022, 12:42 PM

## 2022-05-13 DIAGNOSIS — K7031 Alcoholic cirrhosis of liver with ascites: Secondary | ICD-10-CM | POA: Diagnosis not present

## 2022-05-13 LAB — BASIC METABOLIC PANEL
Anion gap: 7 (ref 5–15)
BUN: 21 mg/dL (ref 8–23)
CO2: 20 mmol/L — ABNORMAL LOW (ref 22–32)
Calcium: 8.9 mg/dL (ref 8.9–10.3)
Chloride: 109 mmol/L (ref 98–111)
Creatinine, Ser: 1.84 mg/dL — ABNORMAL HIGH (ref 0.61–1.24)
GFR, Estimated: 40 mL/min — ABNORMAL LOW (ref >60)
Glucose, Bld: 116 mg/dL — ABNORMAL HIGH (ref 70–99)
Potassium: 4.1 mmol/L (ref 3.5–5.1)
Sodium: 136 mmol/L (ref 135–145)

## 2022-05-13 LAB — CBC
HCT: 29.1 % — ABNORMAL LOW (ref 39.0–52.0)
Hemoglobin: 9.7 g/dL — ABNORMAL LOW (ref 13.0–17.0)
MCH: 32.2 pg (ref 26.0–34.0)
MCHC: 33.3 g/dL (ref 30.0–36.0)
MCV: 96.7 fL (ref 80.0–100.0)
Platelets: 199 10*3/uL (ref 150–400)
RBC: 3.01 MIL/uL — ABNORMAL LOW (ref 4.22–5.81)
RDW: 16.3 % — ABNORMAL HIGH (ref 11.5–15.5)
WBC: 13.6 10*3/uL — ABNORMAL HIGH (ref 4.0–10.5)
nRBC: 0 % (ref 0.0–0.2)

## 2022-05-13 LAB — CA 19-9 (SERIAL): CA 19-9: 132 U/mL — ABNORMAL HIGH (ref 0–35)

## 2022-05-13 LAB — MAGNESIUM: Magnesium: 1.9 mg/dL (ref 1.7–2.4)

## 2022-05-13 LAB — BODY FLUID CULTURE W GRAM STAIN

## 2022-05-13 MED ORDER — ACETAMINOPHEN 325 MG PO TABS
650.0000 mg | ORAL_TABLET | Freq: Three times a day (TID) | ORAL | Status: DC | PRN
  Administered 2022-05-14 – 2022-05-18 (×6): 650 mg via ORAL
  Filled 2022-05-13 (×7): qty 2

## 2022-05-13 MED ORDER — ACETAMINOPHEN 325 MG PO TABS
ORAL_TABLET | ORAL | Status: AC
  Administered 2022-05-13: 650 mg via ORAL
  Filled 2022-05-13: qty 2

## 2022-05-13 NOTE — Progress Notes (Addendum)
PROGRESS NOTE    Thomas Coffey  ZOX:[id number] DOB: May 02, 1955 DOA: 05/10/2022 PCP: Pcp, No  150A/150A-AA  LOS: 2 days   Brief hospital course:   Assessment & Plan: Thomas Coffey is a 67 y.o. male with medical history significant of multiple medical issues including heavy alcohol use, substance abuse, COPD, hypertension, coronary artery disease, atrial fibrillation presenting with abdominal pain, ascites, cirrhosis, small bowel obstruction, AKI.  Patient reports worsening abdominal pain for several weeks.  Patient is known to be seen at least 4-5 times in the ER over the past month.  Baseline heavy alcohol use.  Drinks at least 1 pint a day.  Also with at least 1-2 beers.  Patient states has been trying to decrease his use.  1 pack/day smoker.  Occasional marijuana use.    * Ascites Moderate ascites in setting of alcoholic cirrhotic disease  --paracentesis on 4/25 with 2L removed.  Fluid study neg for SBP --ceftriaxone d/c'ed  Alcoholic cirrhosis of liver with ascites (HCC) + cirrhosis on imaging w/ moderate diffuse ascites pending paracentesis  Long standing ETOH abuse  AST 53, ALT 15, T bili 3.9, INR 1.9, Cr 1.5  MELD score 26  Will start on spironolactone and lasix regimen as BP allows   SBO, ruled out --GenSurg consulted on admission, ruled out SBO  Abdominal pain Generalized abdominal pain on presentation Suspect multifactorial in the setting of ascites, chronic pancreatitis.  Improved after paracentesis. --avoid opioids pain med in the setting of low BP  Hypotension --BP 80's --cont midodrine 10 mg TID (new)  Alcohol abuse Roughly 1 pint of liquor as well as 1-2 beers daily --CIWA protocol Thiamine, folate, multivitamin  Anemia  --received 1u pRBC for Hgb 6.7.   --anemia workup neg for significant def --monitor and transfuse to keep Hgb >7  Chronic pancreatitis (HCC) Chronic pancreatitis on imaging Lipase 46  (HFpEF) heart failure with preserved  ejection fraction 2D echo 10/2018 with EF 60-65% Euvolemic on exam  Essential hypertension, not currently active --BP low  Tobacco abuse 1 pack/day smoker Discussed cessation with admitting physician Nicotine patch  AKI, ruled out Cr 1.5 on presentation.  This is his recent baseline.  Atrial fibrillation with rapid ventricular response (HCC) EKG was sinus tachycardia on presentation Does not appear to be on medical treatment  Hyponatremia, mild --possibly due to cirrhosis  CHF, ruled out --last Echo in 2020 showed normal systolic and diastolic fx.   DVT prophylaxis: Lovenox SQ Code Status: Full code  Family Communication:  Level of care: Med-Surg Dispo:   The patient is from: home Anticipated d/c is to: to be determined Anticipated d/c date is: 1-2 days   Subjective and Interval History:  No new complaint today.    Pt appeared unstable with mobility, so PT ordered.   Objective: Vitals:   05/13/22 0000 05/13/22 0642 05/13/22 0819 05/13/22 1238  BP: 103/68 139/81 102/66 103/68  Pulse: 91 93 100 (!) 103  Resp:  16 18 17   Temp:  97.8 F (36.6 C) (!) 97.5 F (36.4 C) 98.5 F (36.9 C)  TempSrc:    Oral  SpO2:  97% 100% 98%  Weight:      Height:        Intake/Output Summary (Last 24 hours) at 05/13/2022 1551 Last data filed at 05/13/2022 1426 Gross per 24 hour  Intake 0 ml  Output --  Net 0 ml   Filed Weights   05/10/22 0741 05/10/22 1200  Weight: 59.1 kg 58.8 kg  Examination:   Constitutional: NAD, AAOx3, cachetic  HEENT: conjunctivae and lids normal, EOMI CV: No cyanosis.   RESP: normal respiratory effort, on RA Abdomen: softer today   Data Reviewed: I have personally reviewed labs and imaging studies  Time spent: 35 minutes  Darlin Priestly, MD [facility] If 7PM-7AM, please contact night-coverage 05/13/2022, 3:51 PM

## 2022-05-13 NOTE — Plan of Care (Signed)
  Problem: Clinical Measurements: Goal: Diagnostic test results will improve Outcome: Progressing   Problem: Nutrition: Goal: Adequate nutrition will be maintained Outcome: Progressing   Problem: Coping: Goal: Level of anxiety will decrease Outcome: Progressing   Problem: Safety: Goal: Ability to remain free from injury will improve Outcome: Progressing   Problem: Skin Integrity: Goal: Risk for impaired skin integrity will decrease Outcome: Progressing

## 2022-05-13 NOTE — Plan of Care (Addendum)
Patient has complained of headache and generalized pain. RN made physician aware, patient was given tylenol po for pain. Patient would prefer something stronger for pain, at this time physician will continue with tylenol.

## 2022-05-13 NOTE — Progress Notes (Signed)
Mobility Specialist - Progress Note   05/13/22 1442  Mobility  Activity Ambulated with assistance in hallway;Stood at bedside;Dangled on edge of bed  Level of Assistance Contact guard assist, steadying assist  Assistive Device None  Distance Ambulated (ft) 30 ft  Activity Response Tolerated well  Mobility Referral Yes  $Mobility charge 1 Mobility   Pt supine asleep in bed on RA upon arrival. Pt awakens to light touch. Pt completes bed mobility with HHA and extra time. Pt STS and ambulates in hallway CGA with no LOB noted but generally unstable throughout ambulation. Pt returns to bed with needs in reach and bed alarm set.   Terrilyn Saver  Mobility Specialist  05/13/22 2:44 PM

## 2022-05-14 DIAGNOSIS — K7031 Alcoholic cirrhosis of liver with ascites: Secondary | ICD-10-CM | POA: Diagnosis not present

## 2022-05-14 LAB — BASIC METABOLIC PANEL
Anion gap: 7 (ref 5–15)
BUN: 23 mg/dL (ref 8–23)
CO2: 20 mmol/L — ABNORMAL LOW (ref 22–32)
Calcium: 8.9 mg/dL (ref 8.9–10.3)
Chloride: 110 mmol/L (ref 98–111)
Creatinine, Ser: 1.7 mg/dL — ABNORMAL HIGH (ref 0.61–1.24)
GFR, Estimated: 44 mL/min — ABNORMAL LOW (ref >60)
Glucose, Bld: 113 mg/dL — ABNORMAL HIGH (ref 70–99)
Potassium: 4.1 mmol/L (ref 3.5–5.1)
Sodium: 137 mmol/L (ref 135–145)

## 2022-05-14 LAB — CBC
HCT: 29.4 % — ABNORMAL LOW (ref 39.0–52.0)
Hemoglobin: 9.7 g/dL — ABNORMAL LOW (ref 13.0–17.0)
MCH: 31.6 pg (ref 26.0–34.0)
MCHC: 33 g/dL (ref 30.0–36.0)
MCV: 95.8 fL (ref 80.0–100.0)
Platelets: 203 10*3/uL (ref 150–400)
RBC: 3.07 MIL/uL — ABNORMAL LOW (ref 4.22–5.81)
RDW: 16.1 % — ABNORMAL HIGH (ref 11.5–15.5)
WBC: 10.4 10*3/uL (ref 4.0–10.5)
nRBC: 0 % (ref 0.0–0.2)

## 2022-05-14 LAB — CULTURE, BLOOD (ROUTINE X 2)

## 2022-05-14 LAB — TOTAL BILIRUBIN, BODY FLUID: Total bilirubin, fluid: 0.9 mg/dL

## 2022-05-14 LAB — MAGNESIUM: Magnesium: 2 mg/dL (ref 1.7–2.4)

## 2022-05-14 MED ORDER — FUROSEMIDE 40 MG PO TABS
40.0000 mg | ORAL_TABLET | Freq: Every day | ORAL | Status: DC
  Administered 2022-05-14 – 2022-05-19 (×5): 40 mg via ORAL
  Filled 2022-05-14 (×6): qty 1

## 2022-05-14 MED ORDER — SPIRONOLACTONE 12.5 MG HALF TABLET
12.5000 mg | ORAL_TABLET | Freq: Every day | ORAL | Status: DC
  Administered 2022-05-14 – 2022-05-19 (×5): 12.5 mg via ORAL
  Filled 2022-05-14 (×6): qty 1

## 2022-05-14 NOTE — Progress Notes (Signed)
PROGRESS NOTE    Thomas Coffey  ZOX:[id number] DOB: [birth year] DOA: 05/10/2022 PCP: Pcp, No  150A/150A-AA  LOS: 3 days   Brief hospital course:   Assessment & Plan: Thomas Coffey is a 67 y.o. male with medical history significant of multiple medical issues including heavy alcohol use, substance abuse, COPD, hypertension, coronary artery disease, atrial fibrillation presenting with abdominal pain, ascites, cirrhosis, small bowel obstruction, AKI.  Patient reports worsening abdominal pain for several weeks.  Patient is known to be seen at least 4-5 times in the ER over the past month.  Baseline heavy alcohol use.  Drinks at least 1 pint a day.  Also with at least 1-2 beers.  Patient states has been trying to decrease his use.  1 pack/day smoker.  Occasional marijuana use.    * Ascites Moderate ascites in setting of alcoholic cirrhotic disease  --paracentesis on 4/25 with 2L removed.  Fluid study neg for SBP --ceftriaxone d/c'ed --will repeat paracentesis on Monday --resume home lasix and aldactone today  Alcoholic cirrhosis of liver with ascites (HCC) + cirrhosis on imaging w/ moderate diffuse ascites pending paracentesis  Long standing ETOH abuse  AST 53, ALT 15, T bili 3.9, INR 1.9, Cr 1.5  MELD score 26  --resume home lasix and aldactone today  SBO, ruled out --GenSurg consulted on admission, ruled out SBO  Abdominal pain Generalized abdominal pain on presentation Suspect multifactorial in the setting of ascites, chronic pancreatitis.  Improved after paracentesis. --avoid opioids pain med in the setting of low BP  Hypotension --BP 80's, now improved with midodrine  --cont midodrine 10 mg TID (new)  Alcohol abuse Roughly 1 pint of liquor as well as 1-2 beers daily --CIWA protocol Thiamine, folate, multivitamin  Anemia  --received 1u pRBC for Hgb 6.7.   --anemia workup neg for significant def --monitor and transfuse to keep Hgb >7  Chronic pancreatitis  (HCC) Chronic pancreatitis on imaging Lipase 46  (HFpEF) heart failure with preserved ejection fraction 2D echo 10/2018 with EF 60-65% Euvolemic on exam --resume home lasix and aldactone today  Essential hypertension, not currently active --BP low  Tobacco abuse 1 pack/day smoker Discussed cessation with admitting physician Nicotine patch  AKI, ruled out Cr 1.5 on presentation.  This is his recent baseline.  Atrial fibrillation with rapid ventricular response (HCC) EKG was sinus tachycardia on presentation Does not appear to be on medical treatment  Hyponatremia, mild --possibly due to cirrhosis  CHF, ruled out --last Echo in 2020 showed normal systolic and diastolic fx.   DVT prophylaxis: Lovenox SQ Code Status: Full code  Family Communication: attempted to call son Thomas Coffey (the person pt said to call), but no answer and no voice box. Level of care: Med-Surg Dispo:   The patient is from: home Anticipated d/c is to: home Anticipated d/c date is: tomorrow   Subjective and Interval History:  Pt reported he didn't sleep well.    PT eval today, rec home with HH.   Objective: Vitals:   05/14/22 0121 05/14/22 0439 05/14/22 0803 05/14/22 1126  BP: 116/77 114/79 121/84 128/81  Pulse: (!) 103 100 95 90  Resp: 16 18 18 16   Temp: 98 F (36.7 C) 97.8 F (36.6 C) 98.2 F (36.8 C) 97.7 F (36.5 C)  TempSrc:      SpO2: 98% 97% 100% 100%  Weight:      Height:        Intake/Output Summary (Last 24 hours) at 05/14/2022 1444 Last data filed  at 05/14/2022 0439 Gross per 24 hour  Intake 240 ml  Output 0 ml  Net 240 ml   Filed Weights   05/10/22 0741 05/10/22 1200  Weight: 59.1 kg 58.8 kg    Examination:   Constitutional: NAD, alert, oriented to person and place, cachetic  HEENT: conjunctivae and lids normal, EOMI CV: No cyanosis.   RESP: normal respiratory effort, on RA Abdomen:  some distention, but not hard Neuro: II - XII grossly intact.   Psych:  subdued mood and affect.     Data Reviewed: I have personally reviewed labs and imaging studies  Time spent: 35 minutes  Darlin Priestly, MD [facility] If 7PM-7AM, please contact night-coverage 05/14/2022, 2:44 PM

## 2022-05-14 NOTE — Plan of Care (Signed)
  Problem: Clinical Measurements: Goal: Ability to maintain clinical measurements within normal limits will improve Outcome: Progressing Goal: Will remain free from infection Outcome: Progressing Goal: Diagnostic test results will improve Outcome: Progressing   Problem: Activity: Goal: Risk for activity intolerance will decrease Outcome: Progressing   Problem: Nutrition: Goal: Adequate nutrition will be maintained Outcome: Progressing   Problem: Pain Managment: Goal: General experience of comfort will improve Outcome: Progressing   Problem: Safety: Goal: Ability to remain free from injury will improve Outcome: Progressing   

## 2022-05-14 NOTE — Progress Notes (Signed)
Mobility Specialist - Progress Note   05/14/22 0810  Mobility  Activity Ambulated with assistance in room;Ambulated with assistance to bathroom;Stood at bedside;Dangled on edge of bed  Level of Assistance Standby assist, set-up cues, supervision of patient - no hands on  Assistive Device None  Distance Ambulated (ft) 10 ft  Activity Response Tolerated well  Mobility Referral Yes  $Mobility charge 1 Mobility   Author responses to bed alarm. Pt EOB requesting to go to bathroom. Pt STS and ambulates to/from bathroom SBA with no LOB noted but does furniture surf throughout ambulation. Pt returns to bed with needs in reach and bed alarm activated.   Terrilyn Saver  Mobility Specialist  05/14/22 8:11 AM

## 2022-05-14 NOTE — Plan of Care (Signed)

## 2022-05-14 NOTE — Evaluation (Signed)
Physical Therapy Evaluation Patient Details Name: Thomas Coffey MRN: [id number] DOB: 20-Nov-1955 Today's Date: 05/14/2022  History of Present Illness  Pt is a 67 y/o M admitted on 05/10/22 after presenting with abdominal pain, ascited, cirrhosis, & AKI. PMH: heavy alcohol use, substance abuse, COPD, HTN, CAD, a-fib  Clinical Impression  Pt seen for PT evaluation with pt reluctantly agreeable to tx. Pt requires encouragement/education to attempt tasks on his own before PT assists. Pt is able to complete bed mobility with mod I, STS from EOB & toilet with mod I<>Independence, and ambulates bed<>toilet without AD with slow steady gait speed with supervision fade to mod I. Pt declines ambulating in the hallway on this date, noting he did that yesterday. PT educated pt on importance of participation in PT for strength, endurance & balance training, will f/u as able.   Recommendations for follow up therapy are one component of a multi-disciplinary discharge planning process, led by the attending physician.  Recommendations may be updated based on patient status, additional functional criteria and insurance authorization.  Follow Up Recommendations       Assistance Recommended at Discharge Intermittent Supervision/Assistance  Patient can return home with the following  A little help with walking and/or transfers;A little help with bathing/dressing/bathroom;Assistance with cooking/housework;Assist for transportation    Equipment Recommendations None recommended by PT (pt reports he already has a RW)  Recommendations for Other Services       Functional Status Assessment Patient has had a recent decline in their functional status and demonstrates the ability to make significant improvements in function in a reasonable and predictable amount of time.     Precautions / Restrictions Precautions Precautions: Fall Restrictions Weight Bearing Restrictions: No      Mobility  Bed Mobility Overal  bed mobility: Modified Independent             General bed mobility comments: Pt requires max cuing/encouragement to attempt without assistance first, as pt initially stating he needs someone to pull him up, but pt able to complete supine<>sit with mod I with bed rails.    Transfers Overall transfer level: Independent Equipment used: None (grab bar in bathroom)               General transfer comment: STS from EOB & toilet without assistance (grab bar in bathroom)    Ambulation/Gait Ambulation/Gait assistance: Supervision, Modified independent (Device/Increase time) Gait Distance (Feet): 10 Feet (+ 10 ft) Assistive device: None Gait Pattern/deviations: Decreased step length - right, Decreased step length - left, Decreased stride length Gait velocity: decreased        Stairs            Wheelchair Mobility    Modified Rankin (Stroke Patients Only)       Balance Overall balance assessment: Needs assistance, History of Falls Sitting-balance support: Feet supported Sitting balance-Leahy Scale: Good     Standing balance support: During functional activity, No upper extremity supported Standing balance-Leahy Scale: Fair                               Pertinent Vitals/Pain Pain Assessment Pain Assessment: No/denies pain    Home Living Family/patient expects to be discharged to:: Private residence Living Arrangements: Alone   Type of Home: Apartment Home Access: Stairs to enter;Elevator   Entrance Stairs-Number of Steps: Pt notes he lives in 3rd floor apartment with elevator access.     Home Equipment: Agricultural consultant (2  wheels);Cane - quad      Prior Function               Mobility Comments: Pt reports he's ambulatory with PRN use of QC, notes 2 falls in the past 6 months.       Hand Dominance        Extremity/Trunk Assessment   Upper Extremity Assessment Upper Extremity Assessment: Generalized weakness    Lower  Extremity Assessment Lower Extremity Assessment: Generalized weakness       Communication      Cognition Arousal/Alertness: Awake/alert Behavior During Therapy: WFL for tasks assessed/performed Overall Cognitive Status: Within Functional Limits for tasks assessed                                 General Comments: Pt not open to answering AxO questions, follows commands, requires encouragement to complete tasks on his own before PT assists him during session.        General Comments General comments (skin integrity, edema, etc.): Pt with continent void on toilet.    Exercises     Assessment/Plan    PT Assessment Patient needs continued PT services  PT Problem List Decreased strength;Decreased activity tolerance;Decreased balance;Decreased mobility;Decreased safety awareness;Decreased knowledge of use of DME       PT Treatment Interventions DME instruction;Therapeutic exercise;Gait training;Balance training;Stair training;Neuromuscular re-education;Functional mobility training;Therapeutic activities;Patient/family education    PT Goals (Current goals can be found in the Care Plan section)  Acute Rehab PT Goals Patient Stated Goal: none stated PT Goal Formulation: With patient Time For Goal Achievement: 05/28/22 Potential to Achieve Goals: Fair    Frequency Min 2X/week     Co-evaluation               AM-PAC PT "6 Clicks" Mobility  Outcome Measure Help needed turning from your back to your side while in a flat bed without using bedrails?: None Help needed moving from lying on your back to sitting on the side of a flat bed without using bedrails?: None Help needed moving to and from a bed to a chair (including a wheelchair)?: None Help needed standing up from a chair using your arms (e.g., wheelchair or bedside chair)?: None Help needed to walk in hospital room?: A Little Help needed climbing 3-5 steps with a railing? : A Little 6 Click Score: 22     End of Session   Activity Tolerance: Patient tolerated treatment well (pt self limiting) Patient left: in bed;with call bell/phone within reach;with bed alarm set   PT Visit Diagnosis: Unsteadiness on feet (R26.81);History of falling (Z91.81);Muscle weakness (generalized) (M62.81)    Time: 1610-9604 PT Time Calculation (min) (ACUTE ONLY): 8 min   Charges:   PT Evaluation $PT Eval Low Complexity: 1 Low          Aleda Grana, PT, DPT 05/14/22, 11:23 AM   Sandi Mariscal 05/14/2022, 11:21 AM

## 2022-05-15 ENCOUNTER — Inpatient Hospital Stay: Payer: 59

## 2022-05-15 DIAGNOSIS — K7031 Alcoholic cirrhosis of liver with ascites: Secondary | ICD-10-CM | POA: Diagnosis not present

## 2022-05-15 LAB — CBC
HCT: 33.6 % — ABNORMAL LOW (ref 39.0–52.0)
Hemoglobin: 10.7 g/dL — ABNORMAL LOW (ref 13.0–17.0)
MCH: 31.8 pg (ref 26.0–34.0)
MCHC: 31.8 g/dL (ref 30.0–36.0)
MCV: 99.7 fL (ref 80.0–100.0)
Platelets: 207 10*3/uL (ref 150–400)
RBC: 3.37 MIL/uL — ABNORMAL LOW (ref 4.22–5.81)
RDW: 15.9 % — ABNORMAL HIGH (ref 11.5–15.5)
WBC: 9.9 10*3/uL (ref 4.0–10.5)
nRBC: 0 % (ref 0.0–0.2)

## 2022-05-15 LAB — CULTURE, BLOOD (ROUTINE X 2): Culture: NO GROWTH

## 2022-05-15 LAB — BASIC METABOLIC PANEL
Anion gap: 10 (ref 5–15)
BUN: 22 mg/dL (ref 8–23)
CO2: 19 mmol/L — ABNORMAL LOW (ref 22–32)
Calcium: 9.1 mg/dL (ref 8.9–10.3)
Chloride: 109 mmol/L (ref 98–111)
Creatinine, Ser: 1.54 mg/dL — ABNORMAL HIGH (ref 0.61–1.24)
GFR, Estimated: 49 mL/min — ABNORMAL LOW (ref >60)
Glucose, Bld: 149 mg/dL — ABNORMAL HIGH (ref 70–99)
Potassium: 4 mmol/L (ref 3.5–5.1)
Sodium: 138 mmol/L (ref 135–145)

## 2022-05-15 LAB — BODY FLUID CELL COUNT WITH DIFFERENTIAL
Eos, Fluid: 0 %
Lymphs, Fluid: 10 %
Monocyte-Macrophage-Serous Fluid: 25 %
Neutrophil Count, Fluid: 65 %
Total Nucleated Cell Count, Fluid: 750 cu mm

## 2022-05-15 LAB — BODY FLUID CULTURE W GRAM STAIN

## 2022-05-15 LAB — MAGNESIUM: Magnesium: 2.1 mg/dL (ref 1.7–2.4)

## 2022-05-15 MED ORDER — LIDOCAINE 5 % EX PTCH
1.0000 | MEDICATED_PATCH | CUTANEOUS | Status: DC
  Administered 2022-05-15 – 2022-05-26 (×11): 1 via TRANSDERMAL
  Filled 2022-05-15 (×11): qty 1

## 2022-05-15 MED ORDER — GABAPENTIN 300 MG PO CAPS
300.0000 mg | ORAL_CAPSULE | Freq: Once | ORAL | Status: AC
  Administered 2022-05-15: 300 mg via ORAL
  Filled 2022-05-15: qty 1

## 2022-05-15 MED ORDER — LORAZEPAM 2 MG/ML IJ SOLN
1.0000 mg | Freq: Once | INTRAMUSCULAR | Status: AC
  Administered 2022-05-15: 1 mg via INTRAVENOUS
  Filled 2022-05-15: qty 1

## 2022-05-15 MED ORDER — LIDOCAINE HCL (PF) 1 % IJ SOLN
10.0000 mL | Freq: Once | INTRAMUSCULAR | Status: AC
  Administered 2022-05-15: 10 mL via INTRADERMAL

## 2022-05-15 MED ORDER — SODIUM CHLORIDE 0.9 % IV SOLN
2.0000 g | INTRAVENOUS | Status: AC
  Administered 2022-05-15 – 2022-05-19 (×5): 2 g via INTRAVENOUS
  Filled 2022-05-15: qty 20
  Filled 2022-05-15 (×2): qty 2
  Filled 2022-05-15 (×2): qty 20

## 2022-05-15 NOTE — Progress Notes (Signed)
PT Cancellation Note  Patient Details Name: Thomas Coffey MRN: [id number] DOB: 11-25-1955   Cancelled Treatment:    Reason Eval/Treat Not Completed: Other (comment). PT to re-attempt as able.   Olga Coaster PT, DPT [phone] PM,05/15/22

## 2022-05-15 NOTE — TOC Progression Note (Addendum)
Transition of Care [facility]) - Progression Note    Patient Details  Name: Thomas Coffey MRN: [id number] Date of Birth: 1955/08/03  [facility] CM/SW Contact  Marlowe Sax, RN Phone Number: 05/15/2022, 11:30 AM  Clinical Narrative:    Met with the patient at the bedside He lives at home alone He stated he does not have a car and has friends or family take him where he needs to go He uses a cane at home but does not walk well, He stated that he does not need or want additional DME He is agreeable to [facility] if I can find a HH agency to accept him I reached out to [facility], [facility], [facility], [facility], [facility], [facility] does not have PT at this time and can not accept him  [facility] can not accept, [facility]   Expected Discharge Plan: Home w Home Health Services Barriers to Discharge: Continued Medical Work up  Expected Discharge Plan and Services   Discharge Planning Services: CM Consult   Living arrangements for the past 2 months: Single Family Home                 DME Arranged: N/A (refused DME) DME Agency: NA                   Social Determinants of Health (SDOH) Interventions SDOH Screenings   Food Insecurity: No Food Insecurity (05/10/2022)  Housing: Low Risk  (05/10/2022)  Transportation Needs: No Transportation Needs (05/10/2022)  Utilities: Not At Risk (05/10/2022)  Tobacco Use: High Risk (05/10/2022)    Readmission Risk Interventions    12/30/2020   11:53 AM  Readmission Risk Prevention Plan  HRI or Home Care Consult Patient refused  Social Work Consult for Recovery Care Planning/Counseling Complete  Palliative Care Screening Not Applicable  Medication Review Oceanographer) Complete

## 2022-05-15 NOTE — Plan of Care (Signed)

## 2022-05-15 NOTE — Care Management Important Message (Signed)
Important Message  Patient Details  Name: Thomas Coffey MRN: [id number] Date of Birth: 1955-01-18   Medicare Important Message Given:  Yes     Johnell Comings 05/15/2022, 11:08 AM

## 2022-05-15 NOTE — Progress Notes (Signed)
       CROSS COVER NOTE  NAME: Thomas Coffey MRN: [id number] DOB : 1955/06/09 ATTENDING PHYSICIAN: Thomas Priestly, MD    Date of Service   05/15/2022   HPI/Events of Note   Report/Request Medication request received for patient report of Chronic, generalized 8/10 pain.   Interventions   Assessment/Plan: Gabapentin Lidocaine patch       To reach the provider On-Call:   7AM- 7PM see care teams to locate the attending and reach out to them via [email or web address] [email or web address] 7PM-7AM contact night-coverage If you still have difficulty reaching the appropriate provider, please page the [facility] (Director on Call) for [facility] on amion for assistance  This document was prepared using Conservation officer, historic buildings and may include unintentional dictation errors.  Thomas Limbo DNP, MBA, FNP-BC, PMHNP-BC Nurse Practitioner [facility] Pager [phone]

## 2022-05-15 NOTE — Progress Notes (Signed)
PROGRESS NOTE    Thomas Coffey  ZOX:[id number] DOB: Mar 02, 1955 DOA: 05/10/2022 PCP: Pcp, No  150A/150A-AA  LOS: 4 days   Brief hospital course:   Assessment & Plan: Thomas Coffey is a 67 y.o. male with medical history significant of multiple medical issues including heavy alcohol use, substance abuse, COPD, hypertension, coronary artery disease, atrial fibrillation presenting with abdominal pain, ascites, cirrhosis, small bowel obstruction, AKI.  Patient reports worsening abdominal pain for several weeks.  Patient is known to be seen at least 4-5 times in the ER over the past month.  Baseline heavy alcohol use.  Drinks at least 1 pint a day.  Also with at least 1-2 beers.  Patient states has been trying to decrease his use.  1 pack/day smoker.  Occasional marijuana use.    * SBP --paracentesis on 4/25 with 2L removed.  Fluid study neg for SBP, however, paracentesis was done after 1 dose of ceftriaxone.  ceftriaxone d/c'ed then. --repeat paracentesis today with 1.9L removed, ascites fluid cell count consistent with SBP --start ceftriaxone today  Alcoholic cirrhosis of liver with ascites (HCC) + cirrhosis on imaging w/ moderate diffuse ascites pending paracentesis  Long standing ETOH abuse  AST 53, ALT 15, T bili 3.9, INR 1.9, Cr 1.5  MELD score 26  --home lasix and aldactone resumed on 4/28 after BP improved --cont lasix and aldactone  SBO, ruled out --GenSurg consulted on admission, ruled out SBO  Abdominal pain Generalized abdominal pain on presentation Suspect multifactorial in the setting of ascites, chronic pancreatitis.  Improved after paracentesis. --avoid opioids pain med in the setting of low BP  Hypotension --BP 80's, now improved with midodrine  --cont midodrine 10 mg TID (new)  Alcohol abuse Roughly 1 pint of liquor as well as 1-2 beers daily --out of period of withdrawal now Thiamine, folate, multivitamin  Hospital delirium --RN noted more confusion  today  Anemia  --received 1u pRBC for Hgb 6.7.   --anemia workup neg for significant def --monitor and transfuse to keep Hgb >7  Chronic pancreatitis (HCC) Chronic pancreatitis on imaging Lipase 46  Essential hypertension, not currently active --BP low  Tobacco abuse 1 pack/day smoker Discussed cessation with admitting physician Nicotine patch  AKI, ruled out Cr 1.5 on presentation.  This is his recent baseline.  Atrial fibrillation with rapid ventricular response (HCC) EKG was sinus tachycardia on presentation Does not appear to be on medical treatment  Hyponatremia, mild --possibly due to cirrhosis  CHF, ruled out --last Echo in 2020 showed normal systolic and diastolic fx.   DVT prophylaxis: Lovenox SQ Code Status: Full code  Family Communication:  Level of care: Med-Surg Dispo:   The patient is from: home Anticipated d/c is to: home Anticipated d/c date is: >3 days   Subjective and Interval History:  Pt said he hasn't eaten in days.  When asked why, he said he didn't want to.    RN reported pt more confused today.  Paracentesis today removed 1.9 L, ascites fluid study consistent with SBP.   Objective: Vitals:   05/15/22 0023 05/15/22 1430 05/15/22 1505 05/15/22 1723  BP: 128/86 (!) 142/90 133/87 129/85  Pulse: 99 97 89 91  Resp: 18   18  Temp: 97.8 F (36.6 C)   97.9 F (36.6 C)  TempSrc:      SpO2: 100% 97% 100% 100%  Weight:      Height:       No intake or output data in the 24 hours  ending 05/15/22 1849  Filed Weights   05/10/22 0741 05/10/22 1200  Weight: 59.1 kg 58.8 kg    Examination:   Constitutional: NAD, alert, oriented x3, cachetic  HEENT: conjunctivae and lids normal, EOMI CV: No cyanosis.   RESP: normal respiratory effort, on RA Abdomen: rounded SKIN: warm, dry Neuro: II - XII grossly intact.   Psych: depressed mood and affect.     Data Reviewed: I have personally reviewed labs and imaging studies  Time spent: 50  minutes  Darlin Priestly, MD [facility] If 7PM-7AM, please contact night-coverage 05/15/2022, 6:49 PM

## 2022-05-15 NOTE — Plan of Care (Signed)
  Problem: Education: Goal: Knowledge of General Education information will improve Description: Including pain rating scale, medication(s)/side effects and non-pharmacologic comfort measures Outcome: Progressing   Problem: Health Behavior/Discharge Planning: Goal: Ability to manage health-related needs will improve Outcome: Progressing   Problem: Clinical Measurements: Goal: Ability to maintain clinical measurements within normal limits will improve Outcome: Progressing Goal: Diagnostic test results will improve Outcome: Progressing Goal: Respiratory complications will improve Outcome: Progressing Goal: Cardiovascular complication will be avoided Outcome: Progressing   Problem: Activity: Goal: Risk for activity intolerance will decrease Outcome: Progressing   Problem: Nutrition: Goal: Adequate nutrition will be maintained Outcome: Progressing   Problem: Coping: Goal: Level of anxiety will decrease Outcome: Progressing   Problem: Elimination: Goal: Will not experience complications related to bowel motility Outcome: Progressing Goal: Will not experience complications related to urinary retention Outcome: Progressing   Problem: Pain Managment: Goal: General experience of comfort will improve Outcome: Progressing   

## 2022-05-16 DIAGNOSIS — K7031 Alcoholic cirrhosis of liver with ascites: Secondary | ICD-10-CM | POA: Diagnosis not present

## 2022-05-16 LAB — BASIC METABOLIC PANEL
Anion gap: 7 (ref 5–15)
BUN: 22 mg/dL (ref 8–23)
CO2: 24 mmol/L (ref 22–32)
Calcium: 9.2 mg/dL (ref 8.9–10.3)
Chloride: 110 mmol/L (ref 98–111)
Creatinine, Ser: 1.53 mg/dL — ABNORMAL HIGH (ref 0.61–1.24)
GFR, Estimated: 50 mL/min — ABNORMAL LOW (ref >60)
Glucose, Bld: 150 mg/dL — ABNORMAL HIGH (ref 70–99)
Potassium: 4 mmol/L (ref 3.5–5.1)
Sodium: 141 mmol/L (ref 135–145)

## 2022-05-16 LAB — CBC
HCT: 31.6 % — ABNORMAL LOW (ref 39.0–52.0)
Hemoglobin: 10.3 g/dL — ABNORMAL LOW (ref 13.0–17.0)
MCH: 31.7 pg (ref 26.0–34.0)
MCHC: 32.6 g/dL (ref 30.0–36.0)
MCV: 97.2 fL (ref 80.0–100.0)
Platelets: 204 10*3/uL (ref 150–400)
RBC: 3.25 MIL/uL — ABNORMAL LOW (ref 4.22–5.81)
RDW: 15.8 % — ABNORMAL HIGH (ref 11.5–15.5)
WBC: 10.9 10*3/uL — ABNORMAL HIGH (ref 4.0–10.5)
nRBC: 0 % (ref 0.0–0.2)

## 2022-05-16 LAB — PATHOLOGIST SMEAR REVIEW

## 2022-05-16 LAB — MAGNESIUM: Magnesium: 2 mg/dL (ref 1.7–2.4)

## 2022-05-16 MED ORDER — MIDODRINE HCL 5 MG PO TABS
5.0000 mg | ORAL_TABLET | Freq: Three times a day (TID) | ORAL | Status: DC
  Administered 2022-05-16 (×2): 5 mg via ORAL
  Filled 2022-05-16 (×2): qty 1

## 2022-05-16 NOTE — Progress Notes (Signed)
PROGRESS NOTE    Thomas Coffey  UJW:[id number] DOB: 12/27/1955 DOA: 05/10/2022 PCP: Pcp, No  150A/150A-AA  LOS: 5 days   Brief hospital course:   Assessment & Plan: Thomas Coffey is a 67 y.o. male with medical history significant of heavy alcohol use, tobacco abuse, substance use, alcoholic pancreatitis, COPD, hypertension, coronary artery disease, atrial fibrillation presenting with worsening abdominal pain for several weeks.    Patient is known to be seen at least 4-5 times in the ER over the past month.  Baseline heavy alcohol use.  Drinks at least 1 pint a day.  Also with at least 1-2 beers.  Patient states has been trying to decrease his use.  1 pack/day smoker.  Occasional marijuana use.    * SBP --paracentesis on 4/25 with 2L removed.  Fluid study neg for SBP at that time, however, paracentesis was done after 1 dose of ceftriaxone.  ceftriaxone d/c'ed then. --repeat paracentesis on 4/29 with 1.9L removed, ascites fluid cell count consistent with SBP.  Started on ceftriaxone. --cont ceftriaxone  Alcoholic cirrhosis of liver with ascites (HCC) + cirrhosis on imaging w/ moderate diffuse ascites pending paracentesis  Long standing ETOH abuse  AST 53, ALT 15, T bili 3.9, INR 1.9, Cr 1.5  MELD score 26  --home lasix and aldactone resumed on 4/28 after BP improved --cont lasix and aldactone  SBO, ruled out --GenSurg consulted on admission, ruled out SBO  Abdominal pain Generalized abdominal pain on presentation Suspect multifactorial in the setting of ascites, chronic pancreatitis.  Improved after paracentesis. --avoid opioids pain med in the setting of low BP  Hypotension --BP 80's, improved with midodrine 10 mg TID (new) --decrease midodrine to 5 mg TID  Alcohol abuse Roughly 1 pint of liquor as well as 1-2 beers daily --out of period of withdrawal now Thiamine, folate, multivitamin  Acute metabolic encephalopathy --RN noted more confusion on 4/29, possibly due  to SBP.  Mental status appeared improved after starting abx. --delirium precautions.  Anemia  --received 1u pRBC for Hgb 6.7.   --anemia workup neg for significant def --monitor and transfuse to keep Hgb >7  Chronic pancreatitis (HCC) Chronic pancreatitis on imaging Lipase 46  Essential hypertension, not currently active --BP low  Tobacco abuse 1 pack/day smoker Discussed cessation with admitting physician Nicotine patch  AKI, ruled out Cr 1.5 on presentation.  This is his recent baseline.  Atrial fibrillation with rapid ventricular response (HCC) EKG was sinus tachycardia on presentation Does not appear to be on medical treatment  Hyponatremia, mild --possibly due to cirrhosis  CHF, ruled out --last Echo in 2020 showed normal systolic and diastolic fx.   DVT prophylaxis: Lovenox SQ Code Status: Full code  Family Communication:  Level of care: Med-Surg Dispo:   The patient is from: home Anticipated d/c is to: home Anticipated d/c date is: >3 days.  On day 2 of ceftriaxone for SBP.   Subjective and Interval History:  Pt reported feeling a little better today.  RN noted pt less confused today.   Objective: Vitals:   05/16/22 0005 05/16/22 0415 05/16/22 0817 05/16/22 1620  BP: 130/80 (!) 141/86 130/87 (!) 141/91  Pulse: (!) 103 (!) 105 (!) 104 (!) 109  Resp: 16 16 16 16   Temp: 97.7 F (36.5 C) 97.6 F (36.4 C) 98.2 F (36.8 C) 98.2 F (36.8 C)  TempSrc:      SpO2: 100% 100% 100% 100%  Weight:      Height:  Intake/Output Summary (Last 24 hours) at 05/16/2022 1948 Last data filed at 05/16/2022 1925 Gross per 24 hour  Intake 100 ml  Output 0 ml  Net 100 ml    Filed Weights   05/10/22 0741 05/10/22 1200  Weight: 59.1 kg 58.8 kg    Examination:   Constitutional: NAD, AAOx3, cachetic  HEENT: conjunctivae and lids normal, EOMI CV: No cyanosis.   RESP: normal respiratory effort, on RA Neuro: II - XII grossly intact.   Psych: depressed mood  and affect.     Data Reviewed: I have personally reviewed labs and imaging studies  Time spent: 35 minutes  Darlin Priestly, MD [facility] If 7PM-7AM, please contact night-coverage 05/16/2022, 7:48 PM

## 2022-05-16 NOTE — TOC Progression Note (Signed)
Transition of Care [facility]) - Progression Note    Patient Details  Name: Thomas Coffey MRN: [id number] Date of Birth: 1955-03-09  [facility] of [facility]) CM/SW Contact  Marlowe Sax, RN Phone Number: 05/16/2022, 2:50 PM  Clinical Narrative:     TOC continues to follow the patient, he is set up with [facility] for [facility], Appointment for follow up May 13th    Expected Discharge Plan: Home w Home Health Services Barriers to Discharge: Continued Medical Work up  Expected Discharge Plan and Services   Discharge Planning Services: CM Consult   Living arrangements for the past 2 months: Single Family Home                 DME Arranged: N/A (refused DME) DME Agency: NA       HH Arranged: PT, OT HH Agency: [facility] Date HH Agency Contacted: 05/15/22 Time HH Agency Contacted: 1140 Representative spoke with at [facility] Agency: Maralyn Sago   Social Determinants of Health (SDOH) Interventions SDOH Screenings   Food Insecurity: No Food Insecurity (05/10/2022)  Housing: Low Risk  (05/10/2022)  Transportation Needs: No Transportation Needs (05/10/2022)  Utilities: Not At Risk (05/10/2022)  Tobacco Use: High Risk (05/10/2022)    Readmission Risk Interventions    12/30/2020   11:53 AM  Readmission Risk Prevention Plan  HRI or Home Care Consult Patient refused  Social Work Consult for Recovery Care Planning/Counseling Complete  Palliative Care Screening Not Applicable  Medication Review Oceanographer) Complete

## 2022-05-17 DIAGNOSIS — K7031 Alcoholic cirrhosis of liver with ascites: Secondary | ICD-10-CM | POA: Diagnosis not present

## 2022-05-17 DIAGNOSIS — K861 Other chronic pancreatitis: Secondary | ICD-10-CM | POA: Diagnosis not present

## 2022-05-17 DIAGNOSIS — R1084 Generalized abdominal pain: Secondary | ICD-10-CM | POA: Diagnosis not present

## 2022-05-17 LAB — BASIC METABOLIC PANEL
Anion gap: 8 (ref 5–15)
BUN: 27 mg/dL — ABNORMAL HIGH (ref 8–23)
CO2: 21 mmol/L — ABNORMAL LOW (ref 22–32)
Calcium: 9.5 mg/dL (ref 8.9–10.3)
Chloride: 111 mmol/L (ref 98–111)
Creatinine, Ser: 1.75 mg/dL — ABNORMAL HIGH (ref 0.61–1.24)
GFR, Estimated: 42 mL/min — ABNORMAL LOW (ref >60)
Glucose, Bld: 113 mg/dL — ABNORMAL HIGH (ref 70–99)
Potassium: 4 mmol/L (ref 3.5–5.1)
Sodium: 140 mmol/L (ref 135–145)

## 2022-05-17 LAB — CBC
HCT: 29.5 % — ABNORMAL LOW (ref 39.0–52.0)
Hemoglobin: 9.7 g/dL — ABNORMAL LOW (ref 13.0–17.0)
MCH: 31.8 pg (ref 26.0–34.0)
MCHC: 32.9 g/dL (ref 30.0–36.0)
MCV: 96.7 fL (ref 80.0–100.0)
Platelets: 217 10*3/uL (ref 150–400)
RBC: 3.05 MIL/uL — ABNORMAL LOW (ref 4.22–5.81)
RDW: 15.7 % — ABNORMAL HIGH (ref 11.5–15.5)
WBC: 10.2 10*3/uL (ref 4.0–10.5)
nRBC: 0 % (ref 0.0–0.2)

## 2022-05-17 LAB — BODY FLUID CULTURE W GRAM STAIN

## 2022-05-17 LAB — MAGNESIUM: Magnesium: 2 mg/dL (ref 1.7–2.4)

## 2022-05-17 MED ORDER — MIDODRINE HCL 5 MG PO TABS
2.5000 mg | ORAL_TABLET | Freq: Three times a day (TID) | ORAL | Status: DC
  Administered 2022-05-18 – 2022-05-19 (×5): 2.5 mg via ORAL
  Filled 2022-05-17 (×5): qty 1

## 2022-05-17 MED ORDER — ENSURE ENLIVE PO LIQD
237.0000 mL | Freq: Three times a day (TID) | ORAL | Status: DC
  Administered 2022-05-17 – 2022-05-20 (×9): 237 mL via ORAL

## 2022-05-17 NOTE — Progress Notes (Signed)
PROGRESS NOTE    Thomas Coffey  ZOX:[id number] DOB: 02/21/1955 DOA: 05/10/2022 PCP: Pcp, No  150A/150A-AA  LOS: 6 days   Brief hospital course:  Thomas Coffey is a 67 y.o. male with medical history significant of heavy alcohol use, tobacco abuse, substance use, alcoholic pancreatitis, COPD, hypertension, coronary artery disease, atrial fibrillation presenting with worsening abdominal pain for several weeks.    Patient is known to be seen at least 4-5 times in the ER over the past month.  Baseline heavy alcohol use.  Drinks at least 1 pint a day.  Also with at least 1-2 beers.  Patient states has been trying to decrease his use.  1 pack/day smoker.  Occasional marijuana use.     Assessment & Plan:   * SBP --paracentesis on 4/25 with 2L removed.  Fluid study neg for SBP at that time, however, paracentesis was done after 1 dose of ceftriaxone.  ceftriaxone d/c'ed then. --repeat paracentesis on 4/29 with 1.9L removed, ascites fluid cell count consistent with SBP.  Started on ceftriaxone. --cont ceftriaxone --follow peritoneal fluid cultures  Alcoholic cirrhosis of liver with ascites (HCC) + cirrhosis on imaging w/ moderate diffuse ascites pending paracentesis  Long standing ETOH abuse  AST 53, ALT 15, T bili 3.9, INR 1.9, Cr 1.5  MELD score 26  --home lasix and aldactone resumed on 4/28 after BP improved --cont lasix and aldactone  SBO, ruled out --GenSurg consulted on admission, ruled out SBO  Abdominal pain Generalized abdominal pain on presentation Suspect multifactorial in the setting of ascites, chronic pancreatitis.  Improved after paracentesis. --avoid opioids pain med in the setting of low BP  Hypotension --BP 80's, improved with midodrine 10 mg TID (new) --decrease midodrine to 5 >> 2.5 mg TID & taper off as BP tolerates  Alcohol abuse Roughly 1 pint of liquor as well as 1-2 beers daily --out of period of withdrawal now Thiamine, folate, multivitamin  Acute  metabolic encephalopathy --RN noted more confusion on 4/29, possibly due to SBP.  Mental status appeared improved after starting abx. --delirium precautions.  Anemia  --received 1u pRBC for Hgb 6.7.   --anemia workup neg for significant def --monitor and transfuse to keep Hgb >7  Chronic pancreatitis (HCC) Chronic pancreatitis on imaging Lipase 46  Essential hypertension, not currently active --BP low  Tobacco abuse 1 pack/day smoker Discussed cessation with admitting physician Nicotine patch  AKI, ruled out Cr 1.5 on presentation.  This is his recent baseline.  Atrial fibrillation with rapid ventricular response (HCC) EKG was sinus tachycardia on presentation Does not appear to be on medical treatment  Hyponatremia, mild --possibly due to cirrhosis  CHF, ruled out --last Echo in 2020 showed normal systolic and diastolic fx.   DVT prophylaxis: Lovenox SQ Code Status: Full code  Family Communication:  Level of care: Med-Surg Dispo:   The patient is from: home Anticipated d/c is to: home Anticipated d/c date is: TBD pending peritoneal fluid cultures   Subjective and Interval History:  Thomas Coffey seen during breakfast today.  He denies abdominal pain or N/V.  Ate a little breakfast but not eating much per RN.  He denies acute complaints.  Asks to recline head of bed as he is very tired and wants to sleep.   Objective: Vitals:   05/17/22 0030 05/17/22 0031 05/17/22 0733 05/17/22 1230  BP: 136/73  133/89 138/87  Pulse: 71  (!) 123 (!) 107  Resp: 16  17   Temp: 98.4 F (36.9 C)  98.4 F (36.9 C)   TempSrc:      SpO2: (!) 83% 94% 100%   Weight:      Height:        Intake/Output Summary (Last 24 hours) at 05/17/2022 1537 Last data filed at 05/16/2022 2202 Gross per 24 hour  Intake 80 ml  Output 751 ml  Net -671 ml    Filed Weights   05/10/22 0741 05/10/22 1200  Weight: 59.1 kg 58.8 kg    Examination:   General exam: awake, appears fatigued, no acute  distress, cachectic HEENT: moist mucus membranes, hearing grossly normal  Respiratory system: CTAB, no wheezes, rales or rhonchi, normal respiratory effort. Cardiovascular system: normal S1/S2, RRR, no pedal edema.   Gastrointestinal system: mildly distended, non-tender, umbilical hernia noted Central nervous system: A&O x 2+. no gross focal neurologic deficits, normal speech Extremities: moves all, no edema, normal tone Skin: dry, intact, normal temperature Psychiatry: normal mood, congruent affect    Data Reviewed: I have personally reviewed labs and imaging studies  Notable labs --- bicarb 21, glucose 113, BUN 27, Cr 1.75 from 1.53, Hbg 9.7 from 10.3  Micro - peritoneal fluid cultures pending  Cytology - no malignant cells seen on pathologist smear of peritoneal fluid    Time spent: 35 minutes  Pennie Banter, DO [facility] If 7PM-7AM, please contact night-coverage 05/17/2022, 3:37 PM

## 2022-05-17 NOTE — Progress Notes (Signed)
Physical Therapy Treatment Patient Details Name: Thomas Coffey MRN: [id number] DOB: 09/28/1955 Today's Date: 05/17/2022   History of Present Illness Pt is a 67 y/o M admitted on 05/10/22 after presenting with abdominal pain, ascited, cirrhosis, & AKI. PMH: heavy alcohol use, substance abuse, COPD, HTN, CAD, a-fib    PT Comments    Pt agreeable to mobility with some encouragement. Noted to be soft spoken and spoke in a few words only to PT. When pt is motivated and agreeable, he is modI-independent-supervision for mobility tasks. He was able to ambulate to - from the bathroom with supervision, reaches for BUE support, but no physical assistance required. He requested to return to bed with blankets pulled up after prior to ambulating any further. He did stand back up and with encouragement used the RW to ambulate ~13ft total. Returned to room in bed with needs in reach. The patient would benefit from further skilled PT intervention to continue to progress towards goals.    Recommendations for follow up therapy are one component of a multi-disciplinary discharge planning process, led by the attending physician.  Recommendations may be updated based on patient status, additional functional criteria and insurance authorization.  Follow Up Recommendations       Assistance Recommended at Discharge Intermittent Supervision/Assistance  Patient can return home with the following A little help with walking and/or transfers;A little help with bathing/dressing/bathroom;Assistance with cooking/housework;Assist for transportation   Equipment Recommendations  None recommended by PT    Recommendations for Other Services       Precautions / Restrictions Precautions Precautions: Fall Restrictions Weight Bearing Restrictions: No     Mobility  Bed Mobility Overal bed mobility: Modified Independent                  Transfers Overall transfer level: Modified independent Equipment used:   (grab bar in bathroom, bed rails)                    Ambulation/Gait Ambulation/Gait assistance: Supervision Gait Distance (Feet): 70 Feet Assistive device: None, Rolling Duley (2 wheels)         General Gait Details: pt reaching for bilateral UE support when ambulating without AD, some improved gait path and velocity noted with RW   Stairs             Wheelchair Mobility    Modified Rankin (Stroke Patients Only)       Balance Overall balance assessment: Needs assistance, History of Falls Sitting-balance support: Feet supported Sitting balance-Leahy Scale: Good     Standing balance support: During functional activity, No upper extremity supported Standing balance-Leahy Scale: Fair                              Cognition Arousal/Alertness: Awake/alert Behavior During Therapy: WFL for tasks assessed/performed, Flat affect Overall Cognitive Status: Within Functional Limits for tasks assessed                                 General Comments: follows commands but speaks minimally to PT        Exercises      General Comments        Pertinent Vitals/Pain Pain Assessment Pain Assessment:  (stated sometimes he has "all over pain")    Home Living  Prior Function            PT Goals (current goals can now be found in the care plan section) Progress towards PT goals: Progressing toward goals    Frequency    Min 2X/week      PT Plan Current plan remains appropriate    Co-evaluation              AM-PAC PT "6 Clicks" Mobility   Outcome Measure  Help needed turning from your back to your side while in a flat bed without using bedrails?: None Help needed moving from lying on your back to sitting on the side of a flat bed without using bedrails?: None Help needed moving to and from a bed to a chair (including a wheelchair)?: None Help needed standing up from a chair using  your arms (e.g., wheelchair or bedside chair)?: None Help needed to walk in hospital room?: None Help needed climbing 3-5 steps with a railing? : A Little 6 Click Score: 23    End of Session   Activity Tolerance: Patient tolerated treatment well Patient left: in bed;with call bell/phone within reach;with bed alarm set Nurse Communication: Mobility status PT Visit Diagnosis: Unsteadiness on feet (R26.81);History of falling (Z91.81);Muscle weakness (generalized) (M62.81)     Time: 4098-1191 PT Time Calculation (min) (ACUTE ONLY): 11 min  Charges:  $Therapeutic Activity: 8-22 mins                     Olga Coaster PT, DPT 11:25 AM,05/17/22

## 2022-05-17 NOTE — TOC Progression Note (Signed)
Transition of Care [facility]) - Progression Note    Patient Details  Name: Thomas Coffey MRN: [id number] Date of Birth: 10/15/1955  Transition [facility]) CM/SW Contact  Marlowe Sax, RN Phone Number: 05/17/2022, 10:28 AM  Clinical Narrative:     TOC set up with [facility] services, he stated that his son will provide transportation at DC, he has a cane that he uses at home and does not want additional DME, Provided the patient with Substance use resources to stop drinking, he reports that he has cut back and now drinking a pint and a couple of beers a day instead of more that he was before,  TOC signing off, for any further needs please consult again  Expected Discharge Plan: Home w Home Health Services Barriers to Discharge: Continued Medical Work up  Expected Discharge Plan and Services   Discharge Planning Services: CM Consult   Living arrangements for the past 2 months: Single Family Home                 DME Arranged: N/A (refused DME) DME Agency: NA       HH Arranged: PT, OT HH Agency: [facility] Date [facility] Agency Contacted: 05/15/22 Time HH Agency Contacted: 1140 Representative spoke with at [facility] Agency: Maralyn Sago   Social Determinants of Health (SDOH) Interventions SDOH Screenings   Food Insecurity: No Food Insecurity (05/10/2022)  Housing: Low Risk  (05/10/2022)  Transportation Needs: No Transportation Needs (05/10/2022)  Utilities: Not At Risk (05/10/2022)  Tobacco Use: High Risk (05/10/2022)    Readmission Risk Interventions    12/30/2020   11:53 AM  Readmission Risk Prevention Plan  HRI or Home Care Consult Patient refused  Social Work Consult for Recovery Care Planning/Counseling Complete  Palliative Care Screening Not Applicable  Medication Review Oceanographer) Complete

## 2022-05-17 DEATH — deceased

## 2022-05-18 ENCOUNTER — Inpatient Hospital Stay: Payer: 59

## 2022-05-18 DIAGNOSIS — K7031 Alcoholic cirrhosis of liver with ascites: Secondary | ICD-10-CM | POA: Diagnosis not present

## 2022-05-18 DIAGNOSIS — I10 Essential (primary) hypertension: Secondary | ICD-10-CM | POA: Diagnosis not present

## 2022-05-18 DIAGNOSIS — K652 Spontaneous bacterial peritonitis: Secondary | ICD-10-CM | POA: Diagnosis present

## 2022-05-18 DIAGNOSIS — K861 Other chronic pancreatitis: Secondary | ICD-10-CM | POA: Diagnosis not present

## 2022-05-18 DIAGNOSIS — F1010 Alcohol abuse, uncomplicated: Secondary | ICD-10-CM | POA: Diagnosis not present

## 2022-05-18 LAB — URINALYSIS, ROUTINE W REFLEX MICROSCOPIC
Bilirubin Urine: NEGATIVE
Glucose, UA: NEGATIVE mg/dL
Hgb urine dipstick: NEGATIVE
Leukocytes,Ua: NEGATIVE
Nitrite: NEGATIVE
Protein, ur: NEGATIVE mg/dL
Specific Gravity, Urine: 1.02 (ref 1.005–1.030)
pH: 5.5 (ref 5.0–8.0)

## 2022-05-18 LAB — COMPREHENSIVE METABOLIC PANEL
ALT: 21 U/L (ref 0–44)
AST: 54 U/L — ABNORMAL HIGH (ref 15–41)
Albumin: 2.5 g/dL — ABNORMAL LOW (ref 3.5–5.0)
Alkaline Phosphatase: 83 U/L (ref 38–126)
Anion gap: 10 (ref 5–15)
BUN: 31 mg/dL — ABNORMAL HIGH (ref 8–23)
CO2: 21 mmol/L — ABNORMAL LOW (ref 22–32)
Calcium: 9.5 mg/dL (ref 8.9–10.3)
Chloride: 106 mmol/L (ref 98–111)
Creatinine, Ser: 1.89 mg/dL — ABNORMAL HIGH (ref 0.61–1.24)
GFR, Estimated: 39 mL/min — ABNORMAL LOW (ref >60)
Glucose, Bld: 97 mg/dL (ref 70–99)
Potassium: 4.1 mmol/L (ref 3.5–5.1)
Sodium: 137 mmol/L (ref 135–145)
Total Bilirubin: 2.9 mg/dL — ABNORMAL HIGH (ref 0.3–1.2)
Total Protein: 7.1 g/dL (ref 6.5–8.1)

## 2022-05-18 LAB — BODY FLUID CULTURE W GRAM STAIN: Culture: NO GROWTH

## 2022-05-18 MED ORDER — OXYCODONE HCL 5 MG PO TABS
5.0000 mg | ORAL_TABLET | Freq: Once | ORAL | Status: AC
  Administered 2022-05-18: 5 mg via ORAL
  Filled 2022-05-18: qty 1

## 2022-05-18 MED ORDER — MORPHINE SULFATE (PF) 2 MG/ML IV SOLN
1.0000 mg | Freq: Once | INTRAVENOUS | Status: AC
  Administered 2022-05-18: 1 mg via INTRAVENOUS
  Filled 2022-05-18: qty 1

## 2022-05-18 MED ORDER — METHOCARBAMOL 500 MG PO TABS
750.0000 mg | ORAL_TABLET | Freq: Three times a day (TID) | ORAL | Status: DC | PRN
  Administered 2022-05-18 – 2022-05-20 (×3): 750 mg via ORAL
  Filled 2022-05-18 (×3): qty 2

## 2022-05-18 MED ORDER — TRAMADOL HCL 50 MG PO TABS
50.0000 mg | ORAL_TABLET | Freq: Four times a day (QID) | ORAL | Status: DC | PRN
  Administered 2022-05-18: 100 mg via ORAL
  Administered 2022-05-19 (×2): 50 mg via ORAL
  Filled 2022-05-18 (×2): qty 1
  Filled 2022-05-18: qty 2

## 2022-05-18 NOTE — Care Management Important Message (Signed)
Important Message  Patient Details  Name: Thomas Coffey MRN: [id number] Date of Birth: [birth year]   Medicare Important Message Given:  Yes     Johnell Comings 05/18/2022, 1:19 PM

## 2022-05-18 NOTE — Assessment & Plan Note (Addendum)
BMI 17.78, underweight, failure to thrive Related to chronic illnesses -- alcoholism, liver cirrhosis, chronic pancreatitis Patient was started on TPN on 5/8.  Last albumin less than 1.5

## 2022-05-18 NOTE — Plan of Care (Signed)
Patient A&Ox4, confused at times, from home, up with 2 assist in room. Patient is using urinal but has to be reminded he can use it. Pt c/o chronic pain, lido patch applied and tramadol given with partial relief.

## 2022-05-18 NOTE — Progress Notes (Addendum)
PROGRESS NOTE    Thomas Coffey  ZOX:[id number] DOB: 12-09-1955 DOA: 05/10/2022 PCP: Pcp, No  150A/150A-AA  LOS: 7 days   Brief hospital course:  Thomas Coffey is a 67 y.o. male with medical history significant of heavy alcohol use, tobacco abuse, substance use, alcoholic pancreatitis, COPD, hypertension, coronary artery disease, atrial fibrillation presenting with worsening abdominal pain for several weeks.    Patient is known to be seen at least 4-5 times in the ER over the past month.  Baseline heavy alcohol use.  Drinks at least 1 pint a day.  Also with at least 1-2 beers.  Patient states has been trying to decrease his use.  1 pack/day smoker.  Occasional marijuana use.     Assessment & Plan:   * SBP --paracentesis on 4/25 with 2L removed.  Fluid study neg for SBP at that time, however, paracentesis was done after 1 dose of ceftriaxone.  ceftriaxone d/c'ed then. --repeat paracentesis on 4/29 with 1.9L removed, ascites fluid cell count consistent with SBP.  Started on ceftriaxone. --cont ceftriaxone --follow peritoneal fluid cultures  Alcoholic cirrhosis of liver with ascites (HCC) + cirrhosis on imaging w/ moderate diffuse ascites pending paracentesis  Long standing ETOH abuse  AST 53, ALT 15, T bili 3.9, INR 1.9, Cr 1.5  MELD score 26  --home lasix and aldactone resumed on 4/28 after BP improved --cont lasix and aldactone  SBO, ruled out --GenSurg consulted on admission, ruled out SBO  Abdominal pain Generalized abdominal pain on presentation Suspect multifactorial in the setting of ascites, chronic pancreatitis.  Improved after paracentesis. --avoid opioids pain med in the setting of low BP  Hypotension --BP 80's, improved with midodrine 10 mg TID (new) --decrease midodrine to 5 >> 2.5 mg TID & taper off as BP tolerates  Alcohol abuse Roughly 1 pint of liquor as well as 1-2 beers daily --out of period of withdrawal now Thiamine, folate,  multivitamin  Lumbar degenerative joint & disc disease Lumbar muscle spasms See lumbar xrays of 5/2 AM --Robaxin PRN  --Tramadol PRN --Mobilize / OOB   Acute metabolic encephalopathy --RN noted more confusion on 4/29, possibly due to SBP.  Mental status appeared improved after starting abx. --delirium precautions.  Anemia  --received 1u pRBC for Hgb 6.7.   --anemia workup neg for significant def --monitor and transfuse to keep Hgb >7  Chronic pancreatitis (HCC) Chronic pancreatitis on imaging Lipase 46  Essential hypertension, not currently active --BP low  Tobacco abuse 1 pack/day smoker Discussed cessation with admitting physician Nicotine patch  AKI, ruled out Cr 1.5 on presentation.  This is his recent baseline.  Atrial fibrillation with rapid ventricular response (HCC) EKG was sinus tachycardia on presentation Does not appear to be on medical treatment  Hyponatremia, mild --possibly due to cirrhosis  CHF, ruled out --last Echo in 2020 showed normal systolic and diastolic fx.   DVT prophylaxis: Lovenox SQ Code Status: Full code  Family Communication:  Level of care: Med-Surg Dispo:   The patient is from: home Anticipated d/c is to: home Anticipated d/c date is: TBD pending peritoneal fluid cultures   Subjective and Interval History:  Pt resting in bed on his side today.  He reports ongoing severe back pain, has not been able to sleep from pain.  States feels like muscle spasms.  Xray unremarkable, degenerative changes.  No other acute complaints, only back pain and wanting to be able to sleep.  Reports eating very little breakfast.   Objective: Vitals:  05/17/22 1643 05/17/22 1756 05/17/22 2329 05/18/22 0932  BP: 138/84 138/80 (!) 134/55 (!) 144/94  Pulse: (!) 107 (!) 105    Resp: 18  20 20   Temp: 98.1 F (36.7 C)  98.5 F (36.9 C)   TempSrc:      SpO2: 100%  100% 100%  Weight:      Height:        Intake/Output Summary (Last 24 hours) at  05/18/2022 1327 Last data filed at 05/18/2022 0955 Gross per 24 hour  Intake 120 ml  Output 75 ml  Net 45 ml    Filed Weights   05/10/22 0741 05/10/22 1200  Weight: 59.1 kg 58.8 kg    Examination:   General exam: awake, appears fatigued, no acute distress, cachectic HEENT: moist mucus membranes, hearing grossly normal  Respiratory system: on room air, normal respiratory effort. Cardiovascular system: normal S1/S2, RRR, no pedal edema.   Gastrointestinal system: mildly distended, non-tender, umbilical hernia noted Central nervous system: A&O x 2+. no gross focal neurologic deficits, normal but slow speech Extremities: moves all, no edema, normal tone Skin: dry, intact, normal temperature Psychiatry: normal mood, congruent affect    Data Reviewed: I have personally reviewed labs and imaging studies  Notable labs --- bicarb 21, glucose 113, BUN 27, Cr 1.75 from 1.53, Hbg 9.7 from 10.3  Micro - 4/29 peritoneal fluid cultures pending - neg to date  Cytology - no malignant cells seen on pathologist smear of peritoneal fluid    Time spent: 42 minutes  Thomas Banter, DO [facility] If 7PM-7AM, please contact night-coverage 05/18/2022, 1:27 PM

## 2022-05-18 NOTE — Progress Notes (Signed)
       CROSS COVER NOTE  NAME: Thomas Coffey MRN: [id number] DOB : 03-02-1955 ATTENDING PHYSICIAN: Pennie Banter, DO    Date of Service   05/18/2022   HPI/Events of Note   Report/Request Message received from RN reporting 10/10 back pain refractory to tylenol, lidocaine patch, and Oxycodone  On Review of chart Thomas Coffey has a history of chronic back pain  Bedside eval  At bedside Thomas Coffey reports 10/10 back pain that is recurrent and chronic in nature. He reports this time around pain first started 3 weeks ago and has consistently been 7/10 stabbing pain that worsened to 10/10 overnight. Denies abd pain, denies flank pain. Denies trauma to back.  HPI  67 y.o. male with medical history significant of heavy alcohol use, tobacco abuse, substance use, alcoholic pancreatitis, COPD, hypertension, coronary artery disease, atrial fibrillation presenting with worsening abdominal pain for several weeks. He is being treated for peritonitis.  Interventions   Assessment/Plan: UA Lumbar spine Plain film Pain control        To reach the provider On-Call:   7AM- 7PM see care teams to locate the attending and reach out to them via [email or web address] Password: [email or web address] 7PM-7AM contact night-coverage If you still have difficulty reaching the appropriate provider, please page the [facility] (Director on Call) for [facility] on amion for assistance  This document was prepared using Conservation officer, historic buildings and may include unintentional dictation errors.  [name] DNP, MBA, FNP-BC, PMHNP-BC Nurse Practitioner [facility] Pager [phone]

## 2022-05-18 NOTE — Progress Notes (Signed)
Initial Nutrition Assessment  DOCUMENTATION CODES:   Severe malnutrition in context of chronic illness  INTERVENTION:   -Liberalize diet to regular for wider variety of meal selections -MVI with minerals daily -Ensure Enlive po TID, each supplement provides 350 kcal and 20 grams of protein.  -If pt desires aggressive care, consider initiation of enteral nutrition support:   Initiate Osmolite 1.5 @ 20 ml/hr and increase by 10 ml every 12 hours to goal rate of 50 ml/hr.   60 ml Prosource TF BID.    Tube feeding regimen provides 1960 kcal (100% of needs), 115 grams of protein, and 914 ml of H2O.    -If TF started, monitor Mg, K, and Phos and replete as needed secondary to high refeeding risk -Continue thiamine supplementation  NUTRITION DIAGNOSIS:   Severe Malnutrition related to chronic illness (cirrhosis) as evidenced by severe fat depletion, severe muscle depletion, percent weight loss.  GOAL:   Patient will meet greater than or equal to 90% of their needs  MONITOR:   PO intake, Supplement acceptance  REASON FOR ASSESSMENT:   Consult Assessment of nutrition requirement/status  ASSESSMENT:   Pt with medical history significant of heavy alcohol use, tobacco abuse, substance use, alcoholic pancreatitis, COPD, hypertension, coronary artery disease, atrial fibrillation presenting with worsening abdominal pain for several weeks PTA.  Pt admitted with SBP and alcoholic cirrhosis of licer with ascites.   4/25- s/p paracentesis- 2 L removed 4/29- s/p paracentesis- 1.9 L removed  Reviewed I/O's: +120 ml x 24 hours and +219 ml since admission  UOP: 75 ml x 24 hours   Pt sitting up in bed at time of visit with eyes closed. Pt minimally responsive to voice and touch. Pt briefly opened his eyes when RD greeted him, but did not respond and quickly closed his eyes again. Observed meal tray- pt consumed a few bites of pot roast and a few sips of milk and juice. Noted meal  completions 0%.   Per H&P, pt with heavy ETOH abuse, drinks at least a pinot of liquor and 1-2 beers daily.   Reviewed wt hx; pt has experienced a 7.4% wt loss over the past month, which is significant for time frame.   Case discussed with MD and palliative care. Pt with poor prognosis and goals of care discussions are pending. Discussed concerns of poor oral intake, malnutrition, and concern of pt being unable to take sufficient oral intake given prolonged oral intake and mental status. Plan for palliative care to discuss goals of care to see if pt would want to trial NGT. MD agrees pt would be a poor candidate for PEG secondary to ascites and SBP.   Medications reviewed and include folic acid, lasix, potassium chloride, and thiamine.   Labs reviewed: CBGS: 110.   NUTRITION - FOCUSED PHYSICAL EXAM:  Flowsheet Row Most Recent Value  Orbital Region Severe depletion  Upper Arm Region Severe depletion  Thoracic and Lumbar Region Severe depletion  Buccal Region Severe depletion  Temple Region Severe depletion  Clavicle Bone Region Severe depletion  Clavicle and Acromion Bone Region Severe depletion  Scapular Bone Region Severe depletion  Dorsal Hand Severe depletion  Patellar Region Severe depletion  Anterior Thigh Region Severe depletion  Posterior Calf Region Severe depletion  Edema (RD Assessment) None  Hair Reviewed  Eyes Reviewed  Mouth Reviewed  Skin Reviewed  Nails Reviewed       Diet Order:   Diet Order  Diet regular Room service appropriate? Yes; Fluid consistency: Thin  Diet effective now                   EDUCATION NEEDS:   Not appropriate for education at this time  Skin:  Skin Assessment: Reviewed RN Assessment  Last BM:  05/18/22 (type 7)  Height:   Ht Readings from Last 1 Encounters:  05/10/22 5\' 9"  (1.753 m)    Weight:   Wt Readings from Last 1 Encounters:  05/10/22 58.8 kg    Ideal Body Weight:  72.7 kg  BMI:  Body mass  index is 19.14 kg/m.  Estimated Nutritional Needs:   Kcal:  1850-2050  Protein:  105-120 grams  Fluid:  > 1.8 L    Levada Schilling, RD, LDN, CDCES Registered Dietitian II Certified Diabetes Care and Education Specialist Please refer to [facility] for RD and/or RD on-call/weekend/after hours pager

## 2022-05-18 NOTE — Progress Notes (Signed)
Foust, NP notified due to patients increased back pain. Foust ordered a urine culture, morphine, and x-ray of spine.

## 2022-05-19 DIAGNOSIS — K652 Spontaneous bacterial peritonitis: Secondary | ICD-10-CM | POA: Diagnosis not present

## 2022-05-19 DIAGNOSIS — N179 Acute kidney failure, unspecified: Secondary | ICD-10-CM | POA: Diagnosis not present

## 2022-05-19 DIAGNOSIS — E43 Unspecified severe protein-calorie malnutrition: Secondary | ICD-10-CM

## 2022-05-19 DIAGNOSIS — K7031 Alcoholic cirrhosis of liver with ascites: Secondary | ICD-10-CM | POA: Diagnosis not present

## 2022-05-19 LAB — BASIC METABOLIC PANEL
Anion gap: 8 (ref 5–15)
BUN: 38 mg/dL — ABNORMAL HIGH (ref 8–23)
CO2: 22 mmol/L (ref 22–32)
Calcium: 9.5 mg/dL (ref 8.9–10.3)
Chloride: 108 mmol/L (ref 98–111)
Creatinine, Ser: 2.05 mg/dL — ABNORMAL HIGH (ref 0.61–1.24)
GFR, Estimated: 35 mL/min — ABNORMAL LOW (ref >60)
Glucose, Bld: 109 mg/dL — ABNORMAL HIGH (ref 70–99)
Potassium: 4.1 mmol/L (ref 3.5–5.1)
Sodium: 138 mmol/L (ref 135–145)

## 2022-05-19 LAB — PHOSPHORUS: Phosphorus: 3.5 mg/dL (ref 2.5–4.6)

## 2022-05-19 MED ORDER — OXYCODONE HCL 5 MG PO TABS
5.0000 mg | ORAL_TABLET | Freq: Four times a day (QID) | ORAL | Status: DC | PRN
  Administered 2022-05-19 – 2022-05-20 (×2): 10 mg via ORAL
  Administered 2022-05-20 (×2): 5 mg via ORAL
  Administered 2022-05-20: 10 mg via ORAL
  Filled 2022-05-19: qty 2
  Filled 2022-05-19: qty 1
  Filled 2022-05-19: qty 2
  Filled 2022-05-19 (×2): qty 1
  Filled 2022-05-19: qty 2

## 2022-05-19 MED ORDER — LACTATED RINGERS IV SOLN: INTRAVENOUS | Status: DC

## 2022-05-19 NOTE — Progress Notes (Signed)
PT Cancellation Note  Patient Details Name: Thomas Coffey MRN: [id number] DOB: [birth year]   Cancelled Treatment:    Reason Eval/Treat Not Completed: Other (comment). Pt with blanket over his head, removed only once at PT request, quickly covers back up. Refuses any mobility or PT at this time, PT to re-attempt as able.   Olga Coaster PT, DPT 1:38 PM,05/19/22

## 2022-05-19 NOTE — Plan of Care (Signed)

## 2022-05-19 NOTE — Progress Notes (Signed)
PROGRESS NOTE    Thomas Coffey  ZOX:[id number] DOB: August 04, 1955 DOA: 04/28/2022 PCP: Pcp, No  150A/150A-AA  LOS: 7 days   Brief hospital course:  Thomas Coffey is a 67 y.o. male with medical history significant of heavy alcohol use, tobacco abuse, substance use, alcoholic pancreatitis, COPD, hypertension, coronary artery disease, atrial fibrillation presenting with worsening abdominal pain for several weeks.    Patient is known to be seen at least 4-5 times in the ER over the past month.  Baseline heavy alcohol use.  Drinks at least 1 pint a day.  Also with at least 1-2 beers.  Patient states has been trying to decrease his use.  1 pack/day smoker.  Occasional marijuana use.   Patient was found to have SBP on repeat paracentesis (initial was false negative due to antibiotics given before fluid sample collected).  Further hospital course and management as outlined below.   Assessment & Plan:   * SBP --paracentesis on 4/25 with 2L removed.  Fluid study neg for SBP at that time, however, paracentesis was done after 1 dose of ceftriaxone.  ceftriaxone d/c'ed then. --repeat paracentesis on 4/29 with 1.9L removed, ascites fluid cell count consistent with SBP.  Started on ceftriaxone. --cont ceftriaxone - 5 day course --follow peritoneal fluid cultures - neg to date  Alcoholic cirrhosis of liver with ascites (HCC) + cirrhosis on imaging w/ moderate diffuse ascites pending paracentesis  Long standing ETOH abuse  AST 53, ALT 15, T bili 3.9, INR 1.9, Cr 1.5  MELD score 26  --home lasix and aldactone resumed on 4/28 after BP improved --5/3 -- hold lasix and aldactone -- Cr rising, starting fluids  Acute Kidney Injury - likely pre-renal given very poor PO intake since admission. Cr baseline 1 month ago was 1.02-1.19. Cr on admission 1.50, trending upwards, today Cr 2.05. At risk for hepatorenal syndrome. --Start IV fluids --Monitor BMP --Renally dose meds, avoid nephrotoxins and  hypotension --Further eval if not improving on fluids  Severe Protein Calorie Malnutrition - related to chronic illnesses including chronic pancreatitis, cirrhosis, alcohol dependence, likely socioeconomic factors --Appreciate RD's recommendations --Tube feeds recommended if pt agreeable  --Palliative consult for goals of care discussions as overall prognosis is very poor 05/19/22 - pt declined NG tube placement for tube feeds to me on rounds   SBO, ruled out --GenSurg consulted on admission, ruled out SBO  Abdominal pain - due to SBP, chronic pancreatitis Generalized abdominal pain on presentation.  Improved after paracentesis. --avoid opioids pain med in the setting of low BP  Hypotension - POA. Resolved. --BP 80's, improved with midodrine 10 mg TID (new) --stop midodrine 2.5 mg TID --maintain MAP>65 --monitor BP's closely  Alcohol abuse Roughly 1 pint of liquor as well as 1-2 beers daily --out of period of withdrawal now Thiamine, folate, multivitamin  Lumbar degenerative joint & disc disease Lumbar muscle spasms See lumbar xrays of 5/2 AM 5/3 pain uncontrolled on tramadol --Robaxin PRN  --Change tramadol to oxycodone PRN  --Mobilize / OOB   Acute metabolic encephalopathy --RN noted more confusion on 4/29, possibly due to SBP.  Mental status appeared improved after starting abx. --delirium precautions.  Anemia  --received 1u pRBC for Hgb 6.7.   --anemia workup neg for significant def --monitor and transfuse to keep Hgb >7  Chronic pancreatitis (HCC) Chronic pancreatitis on imaging Lipase 46  Essential hypertension, not currently active --BP low  Tobacco abuse 1 pack/day smoker Discussed cessation with admitting physician Nicotine patch  Atrial fibrillation  with rapid ventricular response (HCC) EKG was sinus tachycardia on presentation Does not appear to be on medical treatment  Hyponatremia, mild - resolved --possibly due to cirrhosis --Monitor  BMP  CHF, ruled out --last Echo in 2020 showed normal systolic and diastolic fx.   DVT prophylaxis: Lovenox SQ Code Status: Full code  Family Communication:  Level of care: Med-Surg Dispo:   The patient is from: home Anticipated d/c is to: home Anticipated d/c date is: TBD pending peritoneal fluid cultures   Subjective and Interval History:  Pt resting in bed this AM.  He moans and groans intermittently.  Reports severe pain all over his body.  When I discussed his severe malnutrition, and idea of NG tube for feedings to get his nutrition optimized, he declined, stating he would not want that.  He perseverates on his pain and does not seem capable of having detailed conversation at this time.   Objective: Vitals:   05/17/22 1643 05/17/22 1756 05/17/22 2329 05/18/22 0932  BP: 138/84 138/80 (!) 134/55 (!) 144/94  Pulse: (!) 107 (!) 105    Resp: 18  20 20   Temp: 98.1 F (36.7 C)  98.5 F (36.9 C)   TempSrc:      SpO2: 100%  100% 100%  Weight:      Height:        Intake/Output Summary (Last 24 hours) at 05/18/2022 1327 Last data filed at 05/18/2022 0955 Gross per 24 hour  Intake 120 ml  Output 75 ml  Net 45 ml    Filed Weights   05/16/2022 0741 04/22/2022 1200  Weight: 59.1 kg 58.8 kg    Examination:   General exam: awake, drowsy appearing, no acute distress, cachectic HEENT: moist mucus membranes, hearing grossly normal  Respiratory system: on room air, normal respiratory effort. Cardiovascular system: RRR, no pedal edema.   Gastrointestinal system: more distended today, non-tender, umbilical hernia noted Central nervous system: A&O x 2+. no gross focal neurologic deficits, normal but slow speech Extremities: moves all, no edema, normal tone Skin: dry, intact, normal temperature Psychiatry: depressed mood, flat affect    Data Reviewed: I have personally reviewed labs and imaging studies  Notable labs --- glucose 109, BUN 38, Cr 1.75 >> 2.05 worsening  Micro -  4/29 peritoneal fluid cultures pending - neg to date  Cytology - no malignant cells seen on pathologist smear of peritoneal fluid    Time spent: 42 minutes  Pennie Banter, DO [facility] If 7PM-7AM, please contact night-coverage 05/18/2022, 1:27 PM

## 2022-05-20 DIAGNOSIS — K652 Spontaneous bacterial peritonitis: Secondary | ICD-10-CM | POA: Diagnosis not present

## 2022-05-20 DIAGNOSIS — R188 Other ascites: Secondary | ICD-10-CM | POA: Diagnosis not present

## 2022-05-20 DIAGNOSIS — N179 Acute kidney failure, unspecified: Secondary | ICD-10-CM | POA: Diagnosis not present

## 2022-05-20 DIAGNOSIS — E43 Unspecified severe protein-calorie malnutrition: Secondary | ICD-10-CM | POA: Diagnosis not present

## 2022-05-20 DIAGNOSIS — K7031 Alcoholic cirrhosis of liver with ascites: Secondary | ICD-10-CM | POA: Diagnosis not present

## 2022-05-20 DIAGNOSIS — K7460 Unspecified cirrhosis of liver: Secondary | ICD-10-CM | POA: Diagnosis not present

## 2022-05-20 DIAGNOSIS — K861 Other chronic pancreatitis: Secondary | ICD-10-CM | POA: Diagnosis not present

## 2022-05-20 LAB — COMPREHENSIVE METABOLIC PANEL
ALT: 21 U/L (ref 0–44)
AST: 51 U/L — ABNORMAL HIGH (ref 15–41)
Albumin: 2.3 g/dL — ABNORMAL LOW (ref 3.5–5.0)
Alkaline Phosphatase: 79 U/L (ref 38–126)
Anion gap: 10 (ref 5–15)
BUN: 38 mg/dL — ABNORMAL HIGH (ref 8–23)
CO2: 19 mmol/L — ABNORMAL LOW (ref 22–32)
Calcium: 9.2 mg/dL (ref 8.9–10.3)
Chloride: 108 mmol/L (ref 98–111)
Creatinine, Ser: 1.87 mg/dL — ABNORMAL HIGH (ref 0.61–1.24)
GFR, Estimated: 39 mL/min — ABNORMAL LOW (ref >60)
Glucose, Bld: 132 mg/dL — ABNORMAL HIGH (ref 70–99)
Potassium: 3.9 mmol/L (ref 3.5–5.1)
Sodium: 137 mmol/L (ref 135–145)
Total Bilirubin: 2.3 mg/dL — ABNORMAL HIGH (ref 0.3–1.2)
Total Protein: 6.8 g/dL (ref 6.5–8.1)

## 2022-05-20 LAB — CBC
HCT: 30.5 % — ABNORMAL LOW (ref 39.0–52.0)
Hemoglobin: 9.7 g/dL — ABNORMAL LOW (ref 13.0–17.0)
MCH: 31.6 pg (ref 26.0–34.0)
MCHC: 31.8 g/dL (ref 30.0–36.0)
MCV: 99.3 fL (ref 80.0–100.0)
Platelets: 208 10*3/uL (ref 150–400)
RBC: 3.07 MIL/uL — ABNORMAL LOW (ref 4.22–5.81)
RDW: 15.1 % (ref 11.5–15.5)
WBC: 9.8 10*3/uL (ref 4.0–10.5)
nRBC: 0 % (ref 0.0–0.2)

## 2022-05-20 MED ORDER — CIPROFLOXACIN HCL 500 MG PO TABS
500.0000 mg | ORAL_TABLET | Freq: Every day | ORAL | Status: DC
  Administered 2022-05-21 – 2022-05-26 (×4): 500 mg via ORAL
  Filled 2022-05-20 (×4): qty 1

## 2022-05-20 MED ORDER — ORAL CARE MOUTH RINSE: 15.0000 mL | OROMUCOSAL | Status: DC | PRN

## 2022-05-20 MED ORDER — PANTOPRAZOLE SODIUM 40 MG PO TBEC
40.0000 mg | DELAYED_RELEASE_TABLET | Freq: Two times a day (BID) | ORAL | Status: DC
  Administered 2022-05-20 – 2022-05-26 (×5): 40 mg via ORAL
  Filled 2022-05-20 (×6): qty 1

## 2022-05-20 MED ORDER — GUAIFENESIN-CODEINE 100-10 MG/5ML PO SOLN
10.0000 mL | Freq: Once | ORAL | Status: AC
  Administered 2022-05-20: 10 mL via ORAL

## 2022-05-20 MED ORDER — DRONABINOL 2.5 MG PO CAPS
2.5000 mg | ORAL_CAPSULE | Freq: Two times a day (BID) | ORAL | Status: DC
  Administered 2022-05-20 – 2022-05-26 (×5): 2.5 mg via ORAL
  Filled 2022-05-20 (×6): qty 1

## 2022-05-20 NOTE — Progress Notes (Signed)
PROGRESS NOTE    Thomas Coffey  ZOX:[id number] DOB: 05/21/1955 DOA: 05/08/2022 PCP: Pcp, No  150A/150A-AA  LOS: 9 days   Brief hospital course:  Thomas Coffey is a 67 y.o. male with medical history significant of heavy alcohol use, tobacco abuse, substance use, alcoholic pancreatitis, COPD, hypertension, coronary artery disease, atrial fibrillation presenting with worsening abdominal pain for several weeks.    Patient is known to be seen at least 4-5 times in the ER over the past month.  Baseline heavy alcohol use.  Drinks at least 1 pint a day.  Also with at least 1-2 beers.  Patient states has been trying to decrease his use.  1 pack/day smoker.  Occasional marijuana use.   Patient was found to have SBP on repeat paracentesis (initial was false negative due to antibiotics given before fluid sample collected).  Further hospital course and management as outlined below.   Assessment & Plan:   * SBP --paracentesis on 4/25 with 2L removed.  Fluid study neg for SBP at that time, however, paracentesis was done after 1 dose of ceftriaxone.  ceftriaxone d/c'ed then. --repeat paracentesis on 4/29 with 1.9L removed, ascites fluid cell count consistent with SBP.  --completed ceftriaxone - 5 day course --start PO cipro prophylaxis --peritoneal fluid cultures - negative, final  Alcoholic cirrhosis of liver with ascites (HCC) + cirrhosis on imaging w/ moderate diffuse ascites pending paracentesis  Long standing ETOH abuse  AST 53, ALT 15, T bili 3.9, INR 1.9, Cr 1.5  MELD score 26  --home lasix and aldactone resumed on 4/28 after BP improved --5/3 -- hold lasix and aldactone -- Cr rising, starting fluids  Acute Kidney Injury - likely pre-renal given very poor PO intake since admission. Cr baseline 1 month ago was 1.02-1.19. Cr on admission 1.50, trending upwards, today Cr 2.05 >> 1.87 improving on fluids. At high risk for hepatorenal syndrome. --Continue IV fluids --Monitor  BMP --Renally dose meds, avoid nephrotoxins and hypotension --Further eval if not improving on fluids  Severe Protein Calorie Malnutrition - related to chronic illnesses including chronic pancreatitis, cirrhosis, alcohol dependence, likely socioeconomic factors --Appreciate RD's recommendations --Tube feeds recommended if pt agreeable  --Palliative consult for goals of care discussions as overall prognosis is very poor 05/19/22 - pt declined NG tube placement for tube feeds to me on rounds 05/20/22 - family want to attempt NG tube feeds, met w/ palliative   SBO, ruled out --GenSurg consulted on admission, ruled out SBO  Abdominal pain - due to SBP, chronic pancreatitis Generalized abdominal pain on presentation.  Improved after paracentesis. --avoid opioids pain med in the setting of low BP  Hypotension - POA. Resolved. --BP 80's, improved with midodrine 10 mg TID (new) --now off midodrine & BP's stable --maintain MAP>65 --monitor BP's closely  Alcohol abuse Roughly 1 pint of liquor as well as 1-2 beers daily No significant withdrawal Thiamine, folate, multivitamin  Lumbar degenerative joint & disc disease Lumbar muscle spasms See lumbar xrays of 5/2 AM 5/3 pain uncontrolled on tramadol --Robaxin PRN  --Oxycodone PRN  --Mobilize / OOB   Acute metabolic encephalopathy --RN noted more confusion on 4/29, possibly due to SBP.  Mental status appeared improved after starting abx. --delirium precautions.  Anemia  --received 1u pRBC for Hgb 6.7.   --anemia workup neg for significant def --monitor and transfuse to keep Hgb >7  Chronic pancreatitis (HCC) Chronic pancreatitis on imaging Lipase 46  Essential hypertension, not currently active --BP low  Tobacco abuse 1  pack/day smoker Discussed cessation with admitting physician Nicotine patch  Atrial fibrillation with rapid ventricular response (HCC) EKG was sinus tachycardia on presentation Does not appear to be on  medical treatment  Hyponatremia, mild - resolved --possibly due to cirrhosis --Monitor BMP  CHF, ruled out --last Echo in 2020 showed normal systolic and diastolic fx.   DVT prophylaxis: Lovenox SQ Code Status: Full code  Family Communication:  Level of care: Med-Surg Dispo:   The patient is from: home Anticipated d/c is to: home Anticipated d/c date is: TBD pending peritoneal fluid cultures   Subjective and Interval History:  Pt resting in bed this AM, blanket over his head.  He grumbled but did not remove the blanket. I returned later for family meeting along with palliative care.  Pt awake, somewhat confused, needed to void but would not go until NT helped with urinal, he won't go using purewick.    Family want to attempt temporary tube feeds with NG if patient is accepting of it.  Encouraged they continue goals of care discussions as patient's prognosis is very poor and quality of life has diminished significantly.   Objective: Vitals:   05/19/22 1557 05/20/22 0024 05/20/22 0205 05/20/22 0744  BP: 125/69 118/83 131/88 124/76  Pulse: 97 (!) 106 (!) 106 (!) 105  Resp: 17 18 18 20   Temp: (!) 97.4 F (36.3 C) (!) 97.5 F (36.4 C)  (!) 97.5 F (36.4 C)  TempSrc: Oral     SpO2: 100% 99% 100% 100%  Weight:      Height:        Intake/Output Summary (Last 24 hours) at 05/20/2022 1327 Last data filed at 05/20/2022 0450 Gross per 24 hour  Intake 1221 ml  Output --  Net 1221 ml    Filed Weights   05/09/2022 0741 05/02/2022 1200  Weight: 59.1 kg 58.8 kg    Examination:   General exam: awake, drowsy appearing, no acute distress, cachectic, severely malnourished HEENT: moist mucus membranes, hearing grossly normal  Respiratory system: on room air, normal respiratory effort. Cardiovascular system: RRR, no pedal edema.   Gastrointestinal system: more distended today, non-tender, umbilical hernia noted Central nervous system: A&O x 2+. no gross focal neurologic deficits,  normal but slow speech Extremities: moves all, no edema, normal tone Skin: dry, intact, normal temperature Psychiatry: depressed mood, flat affect, unclear judgment and insight as pt is minimally interactive    Data Reviewed: I have personally reviewed labs and imaging studies  Notable labs --- glucose 132, BUN 38, Cr 1.75 >> 2.05 >> 1.87 Hbg 9.7 stable  Micro - 4/29 peritoneal fluid cultures pending - neg to date  Cytology - no malignant cells seen on pathologist smear of peritoneal fluid    Time spent: 42 minutes  Pennie Banter, DO [facility] If 7PM-7AM, please contact night-coverage 05/20/2022, 1:27 PM

## 2022-05-20 NOTE — Progress Notes (Signed)
PHARMACIST - PHYSICIAN COMMUNICATION  CONCERNING: IV to Oral Route Change Policy  RECOMMENDATION: This patient is receiving pantoprazole by the intravenous route.  Based on criteria approved by the Pharmacy and Therapeutics Committee, the intravenous medication(s) is/are being converted to the equivalent oral dose form(s).  DESCRIPTION: These criteria include: The patient is eating (either orally or via tube) and/or has been taking other orally administered medications for a least 24 hours The patient has no evidence of active gastrointestinal bleeding or impaired GI absorption (gastrectomy, short bowel, patient on TNA or NPO).  If you have questions about this conversion, please contact the Pharmacy Department   [name], [facility] 05/20/2022 1:15 PM

## 2022-05-20 NOTE — Progress Notes (Signed)
Patient had been coughing and had 2 episodes of vomiting. He continued to cough and spit up yellowish phlegm and complained of worsening abdominal pain. Bed was maintained at a High Fowler's position. Cliffton Asters, NP was notified and patient received Robitussin with Codeine.as prescribed. Patient reported relief from both coughing and pain.

## 2022-05-20 NOTE — Consult Note (Signed)
Consultation Note Date: 05/20/2022   Patient Name: KINCAID PEBLEY  DOB: 1955-10-17  MRN: [id number]  Age / Sex: 67 y.o., male  PCP: Pcp, No Referring Physician: Pennie Banter, DO  Reason for Consultation: Establishing goals of care   HPI/Brief Hospital Course: 67 y.o. male  with past medical history of heavy alcohol use, tobacco abuse, chronic alcoholic pancreatitis, COPD, HTN, CAD, A. Fib admitted on 04/30/2022 with worsening abdominal pain for several weeks.  Noted frequent ED visits, 6 in last 6 months. Reportedly drinks about 1 pint per day with a few beers. Current tobacco use and occasional marijuana use.  Found to have ascites, cirrhosis, SBO and AKI Underwent paracentesis 4/25 2L off Repeat paracentesis 4/29 1.9L off-SBP (+)  Palliative medicine was consulted for assisting with goals of care conversations in light of worsening cachexia, chronic pancreatitis, alcoholic cirrhosis, frequent admissions and severe malnutrition.  Subjective:  Extensive chart review has been completed prior to meeting patient including labs, vital signs, imaging, progress notes, orders, and available advanced directive documents from current and previous encounters.  Introduced myself as a Publishing rights manager as a member of the palliative care team. Explained palliative medicine is specialized medical care for people living with serious illness. It focuses on providing relief from the symptoms and stress of a serious illness. The goal is to improve quality of life for both the patient and the family.   Visited with Mr. Caccese at his bedside. Covers pulled over his head when I walked into room, he was able to remove them and acknowledged my presence. Confused and did not participate in conversation.  Connected with sons and able to meet at bedside.  [name]-son, Antonio-son, Aaliyah-DIL and daughter at bedside. Reviewed medical condition and most recent updates.  Shared cirrhosis with ascites and need for paracentesis x2 with almost a total of 4L removed. Being treated with antibiotic therapy for SBP.  Main concern being severe malnutrition with ongoing refusal to eat or minimal intake.  Sons share that he has been living independently for almost a year. Prior to this he was living with his son-[name] who was able to monitor his drinking and eating more frequently. They share he continues to consume alcohol and when he is drinking he does not eat well. They share their understanding that the severity of his malnutrition happened over an extended time period. Able to acknowledge severe cachexia.  Dr. Denton Lank able to join meeting-again reviewed most recent medical updates. Dr. Denton Lank shared she spoke with Mr. Vivirito yesterday regarding NGT placement and at that time he refused. Spoke more to family regarding NGT placement. NGT being temporary to provide artificial nutrition. Unlikely a candidate for PEG placement due to ascites and SBP. With the understanding NGT placement not a means of treatment for underlying chronic conditions contributing to severe malnutrition. Family wishes to proceed with attempting NGT placement-recommend dobhoff placement.  Addressed Code Status-Full Code versus Do Not Resuscitate. Encouraged family to consider DNR/DNI status understanding evidenced based poor outcomes in similar hospitalized patients, as the cause of the arrest is likely associated with chronic/terminal disease rather than a reversible acute cardio-pulmonary event.  Family wished for time to talk amongst themselves and time for processing.  Notified later by nursing staff that dobhoff placed successfully but was immediately pulled out by Mr. Kobs. Called and shared this with Belva Crome, he shares at previous admissions Mr. Gililland responds better with eating soft/pureed foods as he needs new dentures. Family is also interested in appetite stimulant being  started as  recommended by Dr. Denton Lank. [name] also shared family has had time to communicate and they are all in agreement with DNR/DNI.  I discussed importance of continued conversations with family/support persons and all members of their medical team regarding overall plan of care and treatment options ensuring decisions are in alignment with patients goals of care.  All questions/concerns addressed. PMT will continue to follow and support patient as needed.  Objective: Primary Diagnoses: Present on Admission:  Ascites  Alcohol abuse  Anemia  Atrial fibrillation with rapid ventricular response (HCC)  Tobacco abuse  Essential hypertension  Abdominal pain  AKI (acute kidney injury) (HCC)  SBP (spontaneous bacterial peritonitis) (HCC)  Protein-calorie malnutrition, severe   Physical Exam Constitutional:      General: He is not in acute distress.    Appearance: He is cachectic. He is ill-appearing.  Pulmonary:     Effort: Pulmonary effort is normal. No respiratory distress.  Abdominal:     General: There is distension.     Tenderness: There is abdominal tenderness.  Skin:    General: Skin is warm and dry.  Neurological:     Motor: Weakness present.     Vital Signs: BP 124/76   Pulse (!) 105   Temp (!) 97.5 F (36.4 C)   Resp 20   Ht 5\' 9"  (1.753 m)   Wt 58.8 kg   SpO2 100%   BMI 19.14 kg/m  Pain Scale: 0-10 POSS *See Group Information*: S-Acceptable,Sleep, easy to arouse Pain Score: Asleep  IO: Intake/output summary:  Intake/Output Summary (Last 24 hours) at 05/20/2022 1016 Last data filed at 05/20/2022 0450 Gross per 24 hour  Intake 1221 ml  Output --  Net 1221 ml    LBM: Last BM Date : 05/18/22 Baseline Weight: Weight: 59.1 kg Most recent weight: Weight: 58.8 kg      Assessment and Plan  SUMMARY OF RECOMMENDATIONS   DNR Ongoing GOC conversations needed Attempted dobhoff-immediately removed by patient Appetite stimulant ordered and being managed by primary  team PMT to continue to follow for ongoing needs and support  Discussed With: Nursing staff and Primary team.   Thank you for this consult and allowing Palliative Medicine to participate in the care of Geral D. Tesfaye. Palliative medicine will continue to follow and assist as needed.   Time Total: 75 minutes  Time spent includes: Detailed review of medical records (labs, imaging, vital signs), medically appropriate exam (mental status, respiratory, cardiac, skin), discussed with treatment team, counseling and educating patient, family and staff, documenting clinical information, medication management and coordination of care.   Signed by: Leeanne Deed, DNP, AGNP-C Palliative Medicine    Please contact Palliative Medicine Team phone at [phone] for questions and concerns.  For individual provider: See Loretha Stapler

## 2022-05-21 DIAGNOSIS — K7031 Alcoholic cirrhosis of liver with ascites: Secondary | ICD-10-CM | POA: Diagnosis not present

## 2022-05-21 DIAGNOSIS — R188 Other ascites: Secondary | ICD-10-CM | POA: Diagnosis not present

## 2022-05-21 DIAGNOSIS — K861 Other chronic pancreatitis: Secondary | ICD-10-CM | POA: Diagnosis not present

## 2022-05-21 DIAGNOSIS — K652 Spontaneous bacterial peritonitis: Secondary | ICD-10-CM | POA: Diagnosis not present

## 2022-05-21 DIAGNOSIS — Z515 Encounter for palliative care: Secondary | ICD-10-CM

## 2022-05-21 DIAGNOSIS — K7460 Unspecified cirrhosis of liver: Secondary | ICD-10-CM

## 2022-05-21 DIAGNOSIS — Z7189 Other specified counseling: Secondary | ICD-10-CM

## 2022-05-21 DIAGNOSIS — E43 Unspecified severe protein-calorie malnutrition: Secondary | ICD-10-CM | POA: Diagnosis not present

## 2022-05-21 DIAGNOSIS — N179 Acute kidney failure, unspecified: Secondary | ICD-10-CM | POA: Diagnosis not present

## 2022-05-21 LAB — COMPREHENSIVE METABOLIC PANEL
ALT: 23 U/L (ref 0–44)
AST: 60 U/L — ABNORMAL HIGH (ref 15–41)
Albumin: 2.1 g/dL — ABNORMAL LOW (ref 3.5–5.0)
Alkaline Phosphatase: 92 U/L (ref 38–126)
Anion gap: 10 (ref 5–15)
BUN: 35 mg/dL — ABNORMAL HIGH (ref 8–23)
CO2: 19 mmol/L — ABNORMAL LOW (ref 22–32)
Calcium: 9 mg/dL (ref 8.9–10.3)
Chloride: 111 mmol/L (ref 98–111)
Creatinine, Ser: 1.6 mg/dL — ABNORMAL HIGH (ref 0.61–1.24)
GFR, Estimated: 47 mL/min — ABNORMAL LOW (ref >60)
Glucose, Bld: 86 mg/dL (ref 70–99)
Potassium: 4.6 mmol/L (ref 3.5–5.1)
Sodium: 140 mmol/L (ref 135–145)
Total Bilirubin: 2.2 mg/dL — ABNORMAL HIGH (ref 0.3–1.2)
Total Protein: 6.5 g/dL (ref 6.5–8.1)

## 2022-05-21 LAB — AMMONIA: Ammonia: 68 umol/L — ABNORMAL HIGH (ref 9–35)

## 2022-05-21 MED ORDER — LACTULOSE ENEMA
300.0000 mL | Freq: Two times a day (BID) | ORAL | Status: DC
  Filled 2022-05-21: qty 300

## 2022-05-21 MED ORDER — MORPHINE SULFATE (PF) 2 MG/ML IV SOLN
1.0000 mg | INTRAVENOUS | Status: DC | PRN
  Administered 2022-05-22 – 2022-05-26 (×7): 1 mg via INTRAVENOUS
  Filled 2022-05-21 (×8): qty 1

## 2022-05-21 MED ORDER — DEXTROSE IN LACTATED RINGERS 5 % IV SOLN: INTRAVENOUS | Status: DC

## 2022-05-21 MED ORDER — MORPHINE SULFATE (PF) 2 MG/ML IV SOLN
0.5000 mg | Freq: Once | INTRAVENOUS | Status: AC
  Administered 2022-05-21: 0.5 mg via INTRAVENOUS
  Filled 2022-05-21: qty 1

## 2022-05-21 MED ORDER — LACTULOSE ENEMA
300.0000 mL | Freq: Two times a day (BID) | ORAL | Status: DC
  Administered 2022-05-21 – 2022-05-22 (×3): 300 mL via RECTAL
  Filled 2022-05-21 (×5): qty 300

## 2022-05-21 NOTE — Progress Notes (Signed)
Daily Progress Note   Patient Name: Thomas Coffey       Date: 05/21/2022 DOB: 10-Feb-1955  Age: 67 y.o. MRN#: [id number] Attending Physician: Thomas Coffey Primary Care Physician: Thomas Coffey Admit Date: 05/08/2022  Reason for Consultation/Follow-up: Establishing goals of care  HPI/Brief Hospital Review:  67 y.o. male  with past medical history of heavy alcohol use, tobacco abuse, chronic alcoholic pancreatitis, COPD, HTN, CAD, A. Fib admitted on 05/12/2022 with worsening abdominal pain for several weeks.   Noted frequent ED visits, 6 in last 6 months. Reportedly drinks about 1 pint per day with a few beers. Current tobacco use and occasional marijuana use.   Found to have ascites, cirrhosis, SBO and AKI Underwent paracentesis 4/25 2L off Repeat paracentesis 4/29 1.9L off-SBP (+)   Palliative medicine was consulted for assisting with goals of care conversations in light of worsening cachexia, chronic pancreatitis, alcoholic cirrhosis, frequent admissions and severe malnutrition.  Subjective: Extensive chart review has been completed prior to meeting patient including labs, vital signs, imaging, progress notes, orders, and available advanced directive documents from current and previous encounters.    Visited with Thomas Coffey at his bedside earlier in the morning. Resting but awakened to call of his name, acknowledge my presence in the room. Remains confused, unable to answer orientation questions appropriately. Breakfast tray delivered while in room. Thomas Coffey expressed he was hungry and willing to try a bite of food. Assisted with providing small bite of food, unable to follow commands to swallow, pocketed food into cheek, requested he spit food out-safely removed from mouth. Noted  weakened cough with secretions-able to produce small amounts of phlegm.  Later met with family at bedside including 2 sons, daughter, daughter-in-law and grandchildren. Family most concerned as Thomas Coffey expressing signs of pain and discomfort. Shared my concerns of his ability to safely swallow medications as it is also noted in [facility] by nursing staff medications were attempted earlier in the day with noted pocketing. Family requesting medication be provided by IV route. Called and spoke with Thomas Coffey, she is in agreement of trying low dose Morphine. Provided education to family regarding side effects of IV Morphine such as drowsiness and/or increased confusion.  Thomas Coffey able to join family conversation. Shared attempts were made 5/4 for NGT placement-successful placement but tube  was quickly removed by Thomas Coffey. Family is requesting tube be replaced with mittens in place preventing Thomas Coffey from being able to remove. Also requested medication be provided prior to tube place to help with anxiety and agitation.  Expressed concerns to family with Thomas Coffey present regarding earlier attempts made to feed breakfast. Weakened muscles required for swallowing and ongoing disorientation placing Thomas Coffey at HIGH risk for aspiration. Recommendations made for repeat ST evaluation and status to be NPO prior to repeat evaluation.  We discussed possibility of choking and aspiration. Discussed in the event of choking or aspiration, Thomas Coffey with high risk of developing respiratory distress. Readdressed desires for intubation. In the event Thomas Coffey develops respiratory distress or is unable to safely protect his own airway they wish to proceed with intubation.  DNR remains in place-in the event Thomas Coffey does not have a pulse or is not breathing Coffey not attempt resuscitation.  In the event he has a pulse and or is breathing they desire use of advanced airways and or mechanical ventilation in  appropriate circumstances.  Family remains hopeful that NGT can be placed providing Thomas Coffey with nutrition with hopes his appetite will improve-appetite stimulant remains in place  Called to bedside by nursing staff. Unsuccessful at placing NGT tube. Family requests conversation as far as next steps. Spoke with daughter again at bedside. Explained Thomas Coffey changed fluids to include dextrose. As mentioned by Thomas Coffey interested in TPN. Shared TPN can only be administered through PICC/central line which is a procedure Thomas Coffey would have to be able to tolerate. Again reiterated the risk of infection. Again, reiterated Thomas Coffey plans to communicate with RD and pharmacy regarding appropriateness of initiating TPN at this time. Daughter plans to return in the morning and is eager to hear more about nutritional options that are available.  [name] seems to have responded well to IV Morphine-discussed with Thomas Coffey will increase dose and frequency for as needed pain control as he is unable to tolerate PO medications at this time.   Answered and addressed all questions and concerns. PMT to continue to follow for ongoing needs and support.  Objective:  Physical Exam          Vital Signs: BP 101/70 (BP Location: Left Arm)   Pulse (!) 108   Temp (!) 97 F (36.1 C)   Resp 16   Ht 5\' 9"  (1.753 m)   Wt 58.8 kg   SpO2 95%   BMI 19.14 kg/m  SpO2: SpO2: 95 % O2 Device: O2 Device: Nasal Cannula O2 Flow Rate:     Palliative Care Assessment & Plan   Assessment/Recommendation/Plan  DNR Desires intubation if appropriate if pulse and breathing remains Morphine 1 mg q4h PRN for moderate to severe pain as he is unable to tolerate PO medications Family eager to be provided with alternative options regarding providing nutrition PMT to continue to follow for ongoing needs and support  Care plan was discussed with primary team and nursing staff.  Thank you for allowing  the Palliative Medicine Team to assist in the care of this patient.  Total time:  65 minutes  Time spent includes: Detailed review of medical records (labs, imaging, vital signs), medically appropriate exam (mental status, respiratory, cardiac, skin), discussed with treatment team, counseling and educating patient, family and staff, documenting clinical information, medication management and coordination of care.  Leeanne Deed, DNP, AGNP-C Palliative Medicine   Please contact Palliative Medicine Team phone at  [phone] for questions and concerns.

## 2022-05-21 NOTE — Progress Notes (Signed)
Dr Denton Lank made aware that pt pocketing meds, not able to give all meds today, acknowledged, no new orders

## 2022-05-21 NOTE — Progress Notes (Signed)
SLP Cancellation Note  Patient Details Name: Thomas Coffey MRN: [id number] DOB: Mar 29, 1955   Cancelled treatment:       Reason Eval/Treat Not Completed:  Per chart review, pt pocketing POs, including medication, this date. Medication being held. Pt now NPO. Given this, CLOF not supportive of safe oral intake. Will plan to attempt clinical assessment next date, as appropriate.  Clyde Canterbury, M.S., CCC-SLP Speech-Language Pathologist [facility] [phone] (ASCOM)   Woodroe Chen 05/21/2022, 2:00 PM

## 2022-05-21 NOTE — Progress Notes (Signed)
Nutrition Follow-up RD working remotely.   DOCUMENTATION CODES:   Severe malnutrition in context of chronic illness  INTERVENTION:  - monitor for plans concerning nutrition. - will order TF once NGT placement confirmed.   NUTRITION DIAGNOSIS:   Severe Malnutrition related to chronic illness (cirrhosis) as evidenced by severe fat depletion, severe muscle depletion, percent weight loss. -ongoing  GOAL:   Patient will meet greater than or equal to 90% of their needs -unable to meet  MONITOR:   TF tolerance, Labs, Weight trends  REASON FOR ASSESSMENT:   Consult Assessment of nutrition requirement/status, Enteral/tube feeding initiation and management  ASSESSMENT:   Pt with medical history significant of heavy alcohol use, tobacco abuse, substance use, alcoholic pancreatitis, COPD, hypertension, coronary artery disease, atrial fibrillation presenting with worsening abdominal pain for several weeks PTA.  Patient noted to be a/o to self only. Patient assessed in person on 05/18/22 at which time recommendation made for NGT placement and TF recommendations outlined (Osmolite 1.5 @ 20 ml/hr to increase by 10 ml/hr every 12 hours to reach goal rate of 50 ml/hr with 60 ml Prosource TF20 BID).  RNs have attempted bedside NGT placement this afternoon. Please see RN note today at 1415 for further details. Palliative Care NP met with patient's family on 5/4; please see note from interaction for further details as it relates to malnutrition, NGT placement, and additional GOC items. Patient is DNR/DNI.   Patient has not been weighed since admission on 05/10/22. No information documented in the edema section of flow sheet.   Labs reviewed; BUN: 35 mg/dl, creatinine: 1.6 mg/dl, GFR: 47 ml/min.  Medications reviewed; 2.5 mg marinol BID, 1 mg folcite/day,40 mg oral protonix BID, 20 mEq Klor-Con/day, 100 mg thiamine/day.   IVF; D5-LR @ 75 ml/hr (306 kcal/24 hrs).  Diet Order:   Diet Order              Diet NPO time specified  Diet effective now                   EDUCATION NEEDS:   Not appropriate for education at this time  Skin:  Skin Assessment: Reviewed RN Assessment  Last BM:  5/2 (type 7)  Height:   Ht Readings from Last 1 Encounters:  05/10/22 5\' 9"  (1.753 m)    Weight:   Wt Readings from Last 1 Encounters:  05/10/22 58.8 kg    BMI:  Body mass index is 19.14 kg/m.  Estimated Nutritional Needs:  Kcal:  1850-2050 Protein:  105-120 grams Fluid:  > 1.8 L     Trenton Gammon, MS, RD, LDN, CNSC Clinical Dietitian PRN/Relief staff On-call/weekend pager # available in [facility]

## 2022-05-21 NOTE — Progress Notes (Signed)
attempted to insert NG, pt fighting against [place], pulling back, unable to advance tube down his throat because he was screaming out and not able to swallow to allow tube to pass down, family at bedside remained while we attempt insertion, family aware that pt was resisting and unable to tolerate insertion, she asked if MD could call the room to speak with her regarding next steps, Dr Denton Lank and Tiburcio Pea NP made aware of the above

## 2022-05-21 NOTE — Progress Notes (Signed)
PROGRESS NOTE    Thomas Coffey  ZOX:[id number] DOB: July 20, 1955 DOA: 04/20/2022 PCP: Pcp, No  150A/150A-AA  LOS: 10 days   Brief hospital course:  Thomas Coffey is a 67 y.o. male with medical history significant of heavy alcohol use, tobacco abuse, substance use, alcoholic pancreatitis, COPD, hypertension, coronary artery disease, atrial fibrillation presenting with worsening abdominal pain for several weeks.    Patient is known to be seen at least 4-5 times in the ER over the past month.  Baseline heavy alcohol use.  Drinks at least 1 pint a day.  Also with at least 1-2 beers.  Patient states has been trying to decrease his use.  1 pack/day smoker.  Occasional marijuana use.   Patient was found to have SBP on repeat paracentesis (initial was false negative due to antibiotics given before fluid sample collected).  Further hospital course and management as outlined below.   Assessment & Plan:   * SBP --paracentesis on 4/25 with 2L removed.  Fluid study neg for SBP at that time, however, paracentesis was done after 1 dose of ceftriaxone.  ceftriaxone d/c'ed then. --repeat paracentesis on 4/29 with 1.9L removed, ascites fluid cell count consistent with SBP.  --completed ceftriaxone - 5 day course --start PO cipro prophylaxis --peritoneal fluid cultures - negative, final  Alcoholic cirrhosis of liver with ascites (HCC) + cirrhosis on imaging w/ moderate diffuse ascites pending paracentesis  Long standing ETOH abuse  AST 53, ALT 15, T bili 3.9, INR 1.9, Cr 1.5  MELD score 26  --home lasix and aldactone resumed on 4/28 after BP improved --5/3 -- hold lasix and aldactone -- Cr rising, starting fluids  Acute Kidney Injury - likely pre-renal given very poor PO intake since admission. Cr baseline 1 month ago was 1.02-1.19. Cr on admission 1.50, trending upwards, today Cr 2.05 >> 1.87 improving on fluids. At high risk for hepatorenal syndrome. --Continue IV fluids --Monitor  BMP --Renally dose meds, avoid nephrotoxins and hypotension --Further eval if not improving on fluids  Severe Protein Calorie Malnutrition - related to chronic illnesses including chronic pancreatitis, cirrhosis, alcohol dependence, likely socioeconomic factors --Appreciate RD's recommendations --Tube feeds recommended if pt agreeable  --Palliative consult for goals of care discussions as overall prognosis is very poor 05/19/22 - pt declined NG tube placement for tube feeds to me on rounds 05/20/22 - family want to attempt NG tube feeds, met w/ palliative --Change diet to dysphagia 2 --Started trial of low dose Marinol for appetite stimulation - monitor   SBO, ruled out --GenSurg consulted on admission, ruled out SBO  Abdominal pain - due to SBP, chronic pancreatitis Generalized abdominal pain on presentation.  Improved after paracentesis. --avoid opioids pain med in the setting of low BP  Hypotension - POA. Resolved. --BP 80's, improved with midodrine 10 mg TID (new) --now off midodrine & BP's stable --maintain MAP>65 --monitor BP's closely  Alcohol abuse Roughly 1 pint of liquor as well as 1-2 beers daily No significant withdrawal Thiamine, folate, multivitamin  Lumbar degenerative joint & disc disease Lumbar muscle spasms See lumbar xrays of 5/2 AM 5/3 pain uncontrolled on tramadol --Robaxin PRN  --Oxycodone PRN  --Mobilize / OOB   Acute metabolic encephalopathy --RN noted more confusion on 4/29, possibly due to SBP.  Mental status appeared improved after starting abx. --delirium precautions.  Anemia  --received 1u pRBC for Hgb 6.7.   --anemia workup neg for significant def --monitor and transfuse to keep Hgb >7  Chronic pancreatitis (HCC) Chronic pancreatitis  on imaging Lipase 46  Essential hypertension, not currently active --BP low  Tobacco abuse 1 pack/day smoker Discussed cessation with admitting physician Nicotine patch  Atrial fibrillation with rapid  ventricular response (HCC) EKG was sinus tachycardia on presentation Does not appear to be on medical treatment  Hyponatremia, mild - resolved --possibly due to cirrhosis --Monitor BMP  CHF, ruled out --last Echo in 2020 showed normal systolic and diastolic fx.   DVT prophylaxis: Lovenox SQ Code Status: Full code  Family Communication:  Level of care: Med-Surg Dispo:   The patient is from: home Anticipated d/c is to: home Anticipated d/c date is: TBD pending peritoneal fluid cultures   Subjective and Interval History:  Pt sleeping but opened eyes to voice this AM. He would not speak of engage in any interaction.  He would open his eyes each time I spoke, but not answer.  No acute events reported.  After GOC talk with family and palliative care yesterday afternoon, NG tube was placed but patient immediately pulled it out.     Objective: Vitals:   05/20/22 0744 05/20/22 1601 05/20/22 2336 05/21/22 0738  BP: 124/76 116/81 (!) 140/83 101/70  Pulse: (!) 105 (!) 106 99 (!) 108  Resp: 20 16 18 16   Temp: (!) 97.5 F (36.4 C) (!) 97.5 F (36.4 C) 97.6 F (36.4 C) (!) 97 F (36.1 C)  TempSrc:      SpO2: 100% 98% 98% 95%  Weight:      Height:        Intake/Output Summary (Last 24 hours) at 05/21/2022 1136 Last data filed at 05/21/2022 0857 Gross per 24 hour  Intake 2098.51 ml  Output 300 ml  Net 1798.51 ml    Filed Weights   04/28/2022 0741 04/26/2022 1200  Weight: 59.1 kg 58.8 kg    Examination:   General exam: sleeping, opens eyes to voice but non-verbal, no acute distress, cachectic, severely malnourished HEENT: moist mucus membranes, hearing grossly normal  Respiratory system: on room air, normal respiratory effort. Cardiovascular system: RRR, no pedal edema.   Gastrointestinal system: mildly distended, non-tender, umbilical hernia noted Central nervous system: A&O x 2+. no gross focal neurologic deficits, normal but slow speech Extremities: moves all, no edema,  normal tone Skin: dry, intact, normal temperature Psychiatry: depressed mood, flat affect, unclear judgment and insight --non-verbal     Data Reviewed: I have personally reviewed labs and imaging studies  Notable labs --- bicarb 19, BUN 35, Cr improving 1.60, albumin 2.1, AST 60, Tbili 2.2  Micro - 4/29 peritoneal fluid cultures pending - neg to date  Cytology - no malignant cells seen on pathologist smear of peritoneal fluid    Time spent: 35 minutes  Pennie Banter, DO [facility] If 7PM-7AM, please contact night-coverage 05/21/2022, 11:36 AM

## 2022-05-21 NOTE — Progress Notes (Signed)
PT Cancellation Note  Patient Details Name: Thomas Coffey MRN: [id number] DOB: 21-Sep-1955   Cancelled Treatment:    Reason Eval/Treat Not Completed: Fatigue/lethargy limiting ability to participate;Patient's level of consciousness;Medical issues which prohibited therapy (Attempted to see patients, two nursing staff who had just placed rectal tube. Patient who was supine with eyes closed. RN advised he is unable to participate in PT at this time second to not doing well and having atavan.) Will re-attempt at later time/date when patient is medically appropriate.   Luretha Murphy. Ilsa Iha, PT, DPT 05/21/22, 3:47 PM

## 2022-05-22 ENCOUNTER — Inpatient Hospital Stay: Payer: 59

## 2022-05-22 DIAGNOSIS — N179 Acute kidney failure, unspecified: Secondary | ICD-10-CM | POA: Diagnosis not present

## 2022-05-22 DIAGNOSIS — E43 Unspecified severe protein-calorie malnutrition: Secondary | ICD-10-CM | POA: Diagnosis not present

## 2022-05-22 DIAGNOSIS — K7031 Alcoholic cirrhosis of liver with ascites: Secondary | ICD-10-CM | POA: Diagnosis not present

## 2022-05-22 DIAGNOSIS — K861 Other chronic pancreatitis: Secondary | ICD-10-CM | POA: Diagnosis not present

## 2022-05-22 LAB — COMPREHENSIVE METABOLIC PANEL
ALT: 24 U/L (ref 0–44)
AST: 58 U/L — ABNORMAL HIGH (ref 15–41)
Albumin: 2.2 g/dL — ABNORMAL LOW (ref 3.5–5.0)
Alkaline Phosphatase: 101 U/L (ref 38–126)
Anion gap: 10 (ref 5–15)
BUN: 36 mg/dL — ABNORMAL HIGH (ref 8–23)
CO2: 22 mmol/L (ref 22–32)
Calcium: 9.4 mg/dL (ref 8.9–10.3)
Chloride: 111 mmol/L (ref 98–111)
Creatinine, Ser: 1.64 mg/dL — ABNORMAL HIGH (ref 0.61–1.24)
GFR, Estimated: 46 mL/min — ABNORMAL LOW (ref >60)
Glucose, Bld: 136 mg/dL — ABNORMAL HIGH (ref 70–99)
Potassium: 4.4 mmol/L (ref 3.5–5.1)
Sodium: 143 mmol/L (ref 135–145)
Total Bilirubin: 2.7 mg/dL — ABNORMAL HIGH (ref 0.3–1.2)
Total Protein: 6.6 g/dL (ref 6.5–8.1)

## 2022-05-22 LAB — AMMONIA: Ammonia: 31 umol/L (ref 9–35)

## 2022-05-22 MED ORDER — SODIUM CHLORIDE 0.9 % IV SOLN
3.0000 g | Freq: Four times a day (QID) | INTRAVENOUS | Status: DC
  Administered 2022-05-22 – 2022-05-26 (×15): 3 g via INTRAVENOUS
  Filled 2022-05-22: qty 8
  Filled 2022-05-22 (×2): qty 3
  Filled 2022-05-22 (×3): qty 8
  Filled 2022-05-22 (×9): qty 3
  Filled 2022-05-22: qty 8

## 2022-05-22 NOTE — Progress Notes (Addendum)
SLP Cancellation Note  Patient Details Name: ROLLA HAGIE MRN: [id number] DOB: [birth year]   Cancelled treatment:       Reason Eval/Treat Not Completed: Patient's level of consciousness;Patient not medically ready (chart reviewed; consulted MD and TOC)  Upon meeting w/ pt, bilat. Mitts on. Pt w/ eyes open at times. Calling his name, he turned head and phonated but was unable to maintain attention, nor follow 1 command(stick out tongue, raise hand). He could not state his first Name.  Pt does not appear coherent for safe oral intake at this time. Discussion w/ MD re: concerns and pt's presentation and NPO status; MD agreed. Noting his BMI and "Severe malnutrition in context of chronic illness" dx'd by Dietician, recommended consideration of J/Gtube placement and consult w/ GI to discuss options/limitations. There is concern since pt has pulled out NGT when attempting to provide nutrition/hydration per NSG.  ST services will continue to follow pt's status next 1-2 days for appropriateness for BSE. MD agreed.  Recommend frequent oral care for hygiene and stimulation of swallowing.      Jerilynn Som, MS, CCC-SLP Speech Language Pathologist Rehab Services; [facility] [phone] (ascom) Axavier Pressley 05/22/2022, 10:49 AM

## 2022-05-22 NOTE — Plan of Care (Signed)

## 2022-05-22 NOTE — Progress Notes (Addendum)
PROGRESS NOTE    LYNNOX BOYKO  ZOX:[id number] DOB: 01/19/1955 DOA: 04/18/2022 PCP: Pcp, No  150A/150A-AA  LOS: 11 days   Brief hospital course:  JORDIN BETANCOURT is a 67 y.o. male with medical history significant of heavy alcohol use, tobacco abuse, substance use, alcoholic pancreatitis, COPD, hypertension, coronary artery disease, atrial fibrillation presenting with worsening abdominal pain for several weeks.    Patient is known to be seen at least 4-5 times in the ER over the past month.  Baseline heavy alcohol use.  Drinks at least 1 pint a day.  Also with at least 1-2 beers.  Patient states has been trying to decrease his use.  1 pack/day smoker.  Occasional marijuana use.   Patient was found to have SBP on repeat paracentesis (initial was false negative due to antibiotics given before fluid sample collected).  Further hospital course and management as outlined below.   Assessment & Plan:   * SBP --paracentesis on 4/25 with 2L removed.  Fluid study neg for SBP at that time, however, paracentesis was done after 1 dose of ceftriaxone.  ceftriaxone d/c'ed then. --repeat paracentesis on 4/29 with 1.9L removed, ascites fluid cell count consistent with SBP.  --completed ceftriaxone - 5 day course --start PO cipro prophylaxis --peritoneal fluid cultures - negative, final  Dysphagia - seems due to encephalopathy Severe Protein Calorie Malnutrition - related to chronic illnesses including chronic pancreatitis, cirrhosis, alcohol dependence, likely socioeconomic factors --Appreciate RD's recommendations --Tube feeds recommended if pt agreeable -- he refused / pulled out NGT x 2 attempts --Palliative following for goals of care discussions as overall prognosis is very poor 05/19/22 - pt declined NG tube placement for tube feeds to me on rounds 05/20/22 - family want to attempt NG tube feeds, met w/ palliative 05/21/22 - reattempt NG placement, pt unable to tolerate --NPO --SLP for swallow  eval when he can participate -- needs to be awake & alert, following commands, talking --Started trial of low dose Marinol for appetite stimulation - but now NPO --PEG contraindicated given his ascites and SBP, GI agrees --Family would want TPN if feasible and no contraindications, RD following, appreciate assistance  Aspiration Pneumonia - see above. Chest xray 5/6 -- new patchy multifocal pneumonia on left. --start Unasyn --symptomatic care PRN --monitor spO2, supplement to keep > 90% --GOC: family would want intubation/MV if required, no CPR/ACLS if pulseless  Acute metabolic encephalopathy - multifactorial, due to medications, hepatic enc., severe malnutrition Hepatic Encephalopathy --RN noted more confusion on 4/29, possibly due to SBP.   Mental status improved after starting abx, but has significantly declined past few days (5/4 >> 5/6) 5/5 found ammonia elevated. --delirium precautions. --lactulose enemas BID (has rectal tube in place for this)  Alcoholic cirrhosis of liver with ascites (HCC) + cirrhosis on imaging w/ moderate diffuse ascites pending paracentesis  Long standing ETOH abuse  AST 53, ALT 15, T bili 3.9, INR 1.9, Cr 1.5  MELD score 26  --home lasix and aldactone resumed on 4/28 after BP improved --5/3 -- hold lasix and aldactone -- Cr rising, starting fluids  Acute Kidney Injury - likely pre-renal given very poor PO intake since admission. Cr baseline 1 month ago was 1.02-1.19. Cr on admission 1.50, trending upwards, today Cr 2.05 >> 1.87 >>...1.64 improving on fluids. At high risk for hepatorenal syndrome. --Continue IV fluids, changed to D5-LR since NPO and glucose borderline --Monitor BMP --Renally dose meds, avoid nephrotoxins and hypotension --Further eval if not improving on fluids  SBO, ruled out --GenSurg consulted on admission, ruled out SBO  Abdominal pain - due to SBP, chronic pancreatitis Generalized abdominal pain on presentation.  Improved  after paracentesis. Improved. --abx for SBP completed --supportive care  Hypotension - POA. Resolved. --BP 80's, improved with midodrine 10 mg TID (new) --now off midodrine & BP's stable --maintain MAP>65 --monitor BP's closely  Alcohol abuse Roughly 1 pint of liquor as well as 1-2 beers daily No significant withdrawal Thiamine, folate, multivitamin  Lumbar degenerative joint & disc disease Lumbar muscle spasms See lumbar xrays of 5/2 AM 5/3 pain uncontrolled on tramadol --Robaxin PRN  --Oxycodone PRN  >> IV morphine low dose since NPO --Mobilize / OOB   Anemia  --received 1u pRBC for Hgb 6.7.   --anemia workup neg for significant def --monitor and transfuse to keep Hgb >7  Chronic pancreatitis (HCC) Chronic pancreatitis on imaging Lipase 46  Essential hypertension, not currently active --BP low  Tobacco abuse 1 pack/day smoker Discussed cessation with admitting physician Nicotine patch  Atrial fibrillation with rapid ventricular response (HCC) EKG was sinus tachycardia on presentation Does not appear to be on medical treatment  Hyponatremia, mild - resolved --possibly due to cirrhosis --Monitor BMP  CHF, ruled out --last Echo in 2020 showed normal systolic and diastolic fx.   DVT prophylaxis: Lovenox SQ Code Status: Full code  Family Communication:  Level of care: Med-Surg Dispo:   The patient is from: home Anticipated d/c is to: home Anticipated d/c date is: TBD pending peritoneal fluid cultures   Subjective and Interval History:  Pt sleeping when seen this AM, no family present at bedside.  He now has rectal tube since needing lactulose enemas for elevated ammonia but NPO due to aspirating yesterday and pocketing food/pills, unsafe for PO intake.  Multiple staff that have been working with pt since admission note his significant decline with time since admission. This was relayed to family members to consider when making GOC decisions on patient's  behalf.    Objective: Vitals:   05/20/22 2336 05/21/22 0738 05/21/22 1542 05/21/22 2317  BP: (!) 140/83 101/70 113/72 123/79  Pulse: 99 (!) 108 (!) 109 (!) 108  Resp: 18 16 15 16   Temp: 97.6 F (36.4 C) (!) 97 F (36.1 C) (!) 97.5 F (36.4 C) 97.7 F (36.5 C)  TempSrc:   Oral   SpO2: 98% 95% 96% (!) 86%  Weight:      Height:        Intake/Output Summary (Last 24 hours) at 05/22/2022 1418 Last data filed at 05/22/2022 1251 Gross per 24 hour  Intake 418.63 ml  Output 1900 ml  Net -1481.37 ml    Filed Weights   04/26/2022 0741 05/09/2022 1200  Weight: 59.1 kg 58.8 kg    Examination:   General exam: sleeping, opens eyes to voice but non-verbal, no acute distress, cachectic, severely malnourished HEENT: moist mucus membranes, hearing grossly normal  Respiratory system: on room air, normal respiratory effort. CTA diminished throughout Cardiovascular system: RRR, no pedal edema.   Gastrointestinal system: mildly distended, non-tender, umbilical hernia noted Central nervous system: A&O x 2+. no gross focal neurologic deficits, normal but slow speech Extremities: moves all, no edema, normal tone Skin: dry, intact, normal temperature Psychiatry: depressed mood, flat affect, unclear judgment and insight --non-verbal     Data Reviewed: I have personally reviewed labs and imaging studies  Notable labs --- bicarb 19, BUN 35, Cr improving 1.60, albumin 2.1, AST 60, Tbili 2.2  Micro - 4/29  peritoneal fluid cultures pending - neg to date  Cytology - no malignant cells seen on pathologist smear of peritoneal fluid    Time spent: 45 minutes  Pennie Banter, DO [facility] If 7PM-7AM, please contact night-coverage 05/22/2022, 2:18 PM

## 2022-05-22 NOTE — Progress Notes (Addendum)
Nutrition Follow-up  DOCUMENTATION CODES:   Severe malnutrition in context of chronic illness  INTERVENTION:   -RD will follow for goals of care and make further recommendations -Consider initiation of enteral nutrition support:    Initiate Osmolite 1.5 @ 20 ml/hr and increase by 10 ml every 12 hours to goal rate of 50 ml/hr.    60 ml Prosource TF BID.     Tube feeding regimen provides 1960 kcal (100% of needs), 115 grams of protein, and 914 ml of H2O.     -If TF started, monitor Mg, K, and Phos and replete as needed secondary to high refeeding risk -Continue thiamine supplementation -If unable to place NGT, pt daughter amenable to timed trial of TPN   NUTRITION DIAGNOSIS:   Severe Malnutrition related to chronic illness (cirrhosis) as evidenced by severe fat depletion, severe muscle depletion, percent weight loss.  Ongoing  GOAL:   Patient will meet greater than or equal to 90% of their needs  Unmet  MONITOR:   TF tolerance, Labs, Weight trends  REASON FOR ASSESSMENT:   Consult Assessment of nutrition requirement/status, Enteral/tube feeding initiation and management  ASSESSMENT:   Pt with medical history significant of heavy alcohol use, tobacco abuse, substance use, alcoholic pancreatitis, COPD, hypertension, coronary artery disease, atrial fibrillation presenting with worsening abdominal pain for several weeks PTA.  4/25- s/p paracentesis- 2 L removed 4/29- s/p paracentesis- 1.9 L removed 5/5- NPO due to concern of aspiration and pocketing food, NGT placed, pt pulled out NGT, unsuccessful replacement  Reviewed I/O's: +151 ml x 24 hours and +2.9 L since admission  UOP: 600 ml x 24 hours   Rectal tube output: 100 ml x 24 hours  Pt lying in bed. He did not respond to voice. Pt less interactive in comparison to previous visit.   Case discussed with MD. Palliative care following for ogals of care. Pt is a DNR, but family is requesting all nutritional  interventions. Pt pulled out NGT yesterday and replacement was unsuccessful. Given pt's ascites and peritonitis, he would be a poor PEG candidate. MD agrees with RD that pt would also be a poor TPN candidate. Additionally, unlikely that insurance would approve TPN for home.   Spoke with pt daughter at bedside per MD request. Spoke with RN earlier, who reports that pt was given ativan just now due to agitation. Pt just pulled out rectal tube, which was how medications were being administered.   Daughter reports that pt had similar hospitalization 3 years ago, received NGT, and improved. She is concerned that nutritional status is not being addressed and wants to discuss all available options. RD updated pt on medical condition; she is aware mental status has declined and is now unable to swallow. NGT was pulled out by pt, but daughter is amenable to try again and use restraints as needed. Daughter is hopeful that pt will improve, however, RD cautioned that decisions will need to be made regardless as both TPN and NGT are not viable options for discharge (pt unable to leave hospital with NGT and is not a PEG candidate. Pt also will not meet criteria for home TPN). Daughter amenable to try to place NGT again as well as timed trial of TPN to monitor for improvement. Findings discussed with RN and MD.   Medications reviewed and include cipro, marinol, folic acid, lactulose, potasium chloride, thiamine, and dextrose 5% in lactated ringers infusion @ 75 ml/hr.   Labs reviewed: CBGS: 110 (inpatient orders for glycemic control are  none).    Diet Order:   Diet Order             Diet NPO time specified  Diet effective now                   EDUCATION NEEDS:   Not appropriate for education at this time  Skin:  Skin Assessment: Reviewed RN Assessment  Last BM:  05/21/22 (type 7 via rectal tube)  Height:   Ht Readings from Last 1 Encounters:  05/13/2022 5\' 9"  (1.753 m)    Weight:   Wt Readings  from Last 1 Encounters:  04/28/2022 58.8 kg    Ideal Body Weight:  72.7 kg  BMI:  Body mass index is 19.14 kg/m.  Estimated Nutritional Needs:   Kcal:  1850-2050  Protein:  105-120 grams  Fluid:  > 1.8 L    Levada Schilling, RD, LDN, CDCES Registered Dietitian II Certified Diabetes Care and Education Specialist Please refer to [facility] for RD and/or RD on-call/weekend/after hours pager

## 2022-05-22 NOTE — Evaluation (Signed)
Physical Therapy Re-Evaluation Patient Details Name: Thomas Coffey MRN: [id number] DOB: 07-28-1955 Today's Date: 05/22/2022  History of Present Illness  Pt is a 67 y/o M admitted on 04/26/2022 after presenting with abdominal pain, ascited, cirrhosis, & AKI. PMH: heavy alcohol use, substance abuse, COPD, HTN, CAD, a-fib  Clinical Impression  Pt with decline in medical & functional status so seen for PT re-evaluation. Pt received in bed, on 2L/min via nasal cannula throughout session. Pt presents with impaired cognition (overall awareness, safety awareness, orientation, ability to follow simple commands). Pt is able to complete supine>sit with mod assist, sit>supine with supervision & STS at EOB multiple times with min<>mod assist. Pt reaching for object for UE support in standing, able to perform static standing with min assist with UE support & pt leaning somewhat posteriorly onto EOB. Pt unable to follow commands to void in purewick or turn to [facility] when pt inconsistently reports need to have BM. Pt does endorse dizziness 1x while sitting EOB. Due to pt's impaired awareness & ability to follow commands, assisted pt back to bed vs chair at end of session. Due to pt's decline in functional mobility, recommend rehab (<3 hours/day) upon d/c from acute setting.     Recommendations for follow up therapy are one component of a multi-disciplinary discharge planning process, led by the attending physician.  Recommendations may be updated based on patient status, additional functional criteria and insurance authorization.  Follow Up Recommendations Can patient physically be transported by private vehicle: No     Assistance Recommended at Discharge Frequent or constant Supervision/Assistance  Patient can return home with the following  A lot of help with walking and/or transfers;A lot of help with bathing/dressing/bathroom;Assistance with feeding;Assist for transportation;Help with stairs or ramp for  entrance;Direct supervision/assist for medications management;Direct supervision/assist for financial management;Assistance with cooking/housework    Equipment Recommendations None recommended by PT (TBD in next venue)  Recommendations for Other Services       Functional Status Assessment Patient has had a recent decline in their functional status and demonstrates the ability to make significant improvements in function in a reasonable and predictable amount of time.     Precautions / Restrictions Precautions Precautions: Fall Restrictions Weight Bearing Restrictions: No      Mobility  Bed Mobility Overal bed mobility: Needs Assistance Bed Mobility: Supine to Sit, Sit to Supine     Supine to sit: Mod assist, HOB elevated Sit to supine: Supervision        Transfers Overall transfer level: Needs assistance Equipment used: None Transfers: Sit to/from Stand Sit to Stand: Min assist, Mod assist           General transfer comment: STS from EOB multiple times during session    Ambulation/Gait                  Stairs            Wheelchair Mobility    Modified Rankin (Stroke Patients Only)       Balance Overall balance assessment: Needs assistance, History of Falls Sitting-balance support: Feet supported Sitting balance-Leahy Scale: Fair     Standing balance support: No upper extremity supported Standing balance-Leahy Scale: Poor                               Pertinent Vitals/Pain Pain Assessment Pain Assessment: Faces Faces Pain Scale: Hurts a little bit Pain Location: generalized discomfort Pain Descriptors / Indicators:  Discomfort Pain Intervention(s): Monitored during session, Limited activity within patient's tolerance, Repositioned    Home Living Family/patient expects to be discharged to:: Private residence Living Arrangements: Alone   Type of Home: Apartment Home Access: Stairs to enter;Elevator   Entrance  Stairs-Number of Steps: Pt notes he lives in 3rd floor apartment with elevator access.     Home Equipment: Agricultural consultant (2 wheels);Cane - quad      Prior Function               Mobility Comments: Pt reports he's ambulatory with PRN use of QC, notes 2 falls in the past 6 months.       Hand Dominance        Extremity/Trunk Assessment   Upper Extremity Assessment Upper Extremity Assessment: Generalized weakness;Difficult to assess due to impaired cognition    Lower Extremity Assessment Lower Extremity Assessment: Generalized weakness;Difficult to assess due to impaired cognition       Communication      Cognition Arousal/Alertness: Lethargic Behavior During Therapy: Restless Overall Cognitive Status: Impaired/Different from baseline Area of Impairment: Orientation, Attention, Memory, Following commands, Safety/judgement, Awareness, Problem solving                 Orientation Level: Disoriented to, Place, Time, Situation   Memory: Decreased recall of precautions Following Commands: Follows one step commands inconsistently, Follows one step commands with increased time Safety/Judgement: Decreased awareness of safety, Decreased awareness of deficits Awareness: Intellectual Problem Solving: Decreased initiation, Requires tactile cues, Requires verbal cues, Slow processing, Difficulty sequencing General Comments: Pt requires extra time to inconsistently follow one step commands during session.        General Comments General comments (skin integrity, edema, etc.): Pt on 2L/min via nasal cannula throughout session.    Exercises     Assessment/Plan    PT Assessment Patient needs continued PT services  PT Problem List Decreased strength;Decreased activity tolerance;Decreased balance;Decreased mobility;Decreased safety awareness;Decreased knowledge of use of DME;Cardiopulmonary status limiting activity;Decreased cognition       PT Treatment Interventions  DME instruction;Therapeutic exercise;Gait training;Balance training;Stair training;Neuromuscular re-education;Functional mobility training;Therapeutic activities;Patient/family education;Cognitive remediation    PT Goals (Current goals can be found in the Care Plan section)  Acute Rehab PT Goals PT Goal Formulation: Patient unable to participate in goal setting Time For Goal Achievement: 06/05/22 Potential to Achieve Goals: Fair    Frequency Min 2X/week     Co-evaluation               AM-PAC PT "6 Clicks" Mobility  Outcome Measure Help needed turning from your back to your side while in a flat bed without using bedrails?: A Lot Help needed moving from lying on your back to sitting on the side of a flat bed without using bedrails?: A Lot Help needed moving to and from a bed to a chair (including a wheelchair)?: A Lot Help needed standing up from a chair using your arms (e.g., wheelchair or bedside chair)?: A Lot Help needed to walk in hospital room?: A Lot Help needed climbing 3-5 steps with a railing? : Total 6 Click Score: 11    End of Session Equipment Utilized During Treatment: Oxygen Activity Tolerance: Patient limited by lethargy (limited 2/2 impaired cognition) Patient left: in bed;with call bell/phone within reach;with bed alarm set   PT Visit Diagnosis: Unsteadiness on feet (R26.81);History of falling (Z91.81);Muscle weakness (generalized) (M62.81);Difficulty in walking, not elsewhere classified (R26.2);Other abnormalities of gait and mobility (R26.89)    Time: 8469-6295 PT Time  Calculation (min) (ACUTE ONLY): 13 min   Charges:   PT Evaluation $PT Re-evaluation: 1 Re-eval          Aleda Grana, PT, DPT 05/22/22, 2:01 PM   Sandi Mariscal 05/22/2022, 1:58 PM

## 2022-05-22 NOTE — Progress Notes (Signed)
PT Cancellation Note  Patient Details Name: Thomas Coffey MRN: [id number] DOB: [birth year]   Cancelled Treatment:    Reason Eval/Treat Not Completed: Other (comment)  Chart reviewed.  Pt with significant decline in status since last quality PT visit.  Will re-schedule with PT for next visit.   Danielle Dess 05/22/2022, 11:07 AM

## 2022-05-22 NOTE — Consult Note (Signed)
Pharmacy Antibiotic Note  Thomas Coffey is a 67 y.o. male admitted on 05/10/2022 with aspiration pneumonia.  Pharmacy has been consulted for Unasyn dosing.  Plan: Start Unasyn 3 grams IV every 6 hours Follow renal function for dose adjustments  Height: 5\' 9"  (175.3 cm) Weight: 58.8 kg (129 lb 10.1 oz) IBW/kg (Calculated) : 70.7  Temp (24hrs), Avg:98 F (36.7 C), Min:97.7 F (36.5 C), Max:98.3 F (36.8 C)  Recent Labs  Lab 05/16/22 0633 05/17/22 0607 05/18/22 0547 05/19/22 0854 05/20/22 0624 05/21/22 0444 05/22/22 0505  WBC 10.9* 10.2  --   --  9.8  --   --   CREATININE 1.53* 1.75* 1.89* 2.05* 1.87* 1.60* 1.64*    Estimated Creatinine Clearance: 36.8 mL/min (A) (by C-G formula based on SCr of 1.64 mg/dL (H)).    No Known Allergies  Antimicrobials this admission: Unasyn 5/6 >>  ceftriaxone 4/29 >> 5/3  Dose adjustments this admission: N/A  Microbiology results: 4/24 BCx: no growth, finalized 4/25 peritoneal fluid: no growth  Thank you for allowing pharmacy to be a part of this patient's care.  Barrie Folk, PharmD 05/22/2022 3:49 PM

## 2022-05-22 NOTE — TOC Progression Note (Signed)
Transition of Care [facility]) - Progression Note    Patient Details  Name: Thomas Coffey MRN: [id number] Date of Birth: 06-21-1955  Transition of Care [facility]) CM/SW Contact  Marlowe Sax, RN Phone Number: 05/22/2022, 9:52 AM  Clinical Narrative:   Patient has declined in health, NG tube was placed and he pulled it out, he is now confused, his family has met with Palliative and the patient is now a DNR, He is not able to eat due to choking hazard and pocketing food,  Called son [name] and left a general VM for a call back leaving Contact information  TOC will continue to follow and assist with DC planning and needs       Expected Discharge Plan:  (TBD) Barriers to Discharge: Continued Medical Work up  Expected Discharge Plan and Services   Discharge Planning Services: CM Consult   Living arrangements for the past 2 months: Single Family Home                 DME Arranged: N/A (refused DME) DME Agency: NA       HH Arranged: PT, OT HH Agency: [facility] Date HH Agency Contacted: 05/15/22 Time HH Agency Contacted: [phone] Representative spoke with at [facility] Agency: Maralyn Sago   Social Determinants of Health (SDOH) Interventions SDOH Screenings   Food Insecurity: No Food Insecurity (05/13/2022)  Housing: Low Risk  (04/19/2022)  Transportation Needs: No Transportation Needs (05/06/2022)  Utilities: Not At Risk (05/08/2022)  Tobacco Use: High Risk (05/08/2022)    Readmission Risk Interventions    12/30/2020   11:53 AM  Readmission Risk Prevention Plan  HRI or Home Care Consult Patient refused  Social Work Consult for Recovery Care Planning/Counseling Complete  Palliative Care Screening Not Applicable  Medication Review Oceanographer) Complete

## 2022-05-22 NOTE — TOC Progression Note (Signed)
Transition of Care (TOC) - Progression Note    Patient Details  Name: Thomas Coffey MRN: [id number] Date of Birth: [birth year]  Transition of Care [facility]) CM/SW Contact  Marlowe Sax, RN Phone Number: 05/22/2022, 12:33 PM  Clinical Narrative:   Spoke with the patient's 2 sons and daughter on a group call, I explained that the patient will likely need long term care, I explained that Medicaid does not cover all of it and therefore will need to sign over his check to cover the rest, He will not be able to do TPN at a long term facility if he does qualify, I explained that Insurance often does not cover TPN and it is very expensive and would be out of pocket I explained that the patient has declined in the last few days and the patient's family will need to start planning for next steps, his daughter will be at the hospital in the next hour to talk to the doctor    Expected Discharge Plan:  (TBD) Barriers to Discharge: Continued Medical Work up  Expected Discharge Plan and Services   Discharge Planning Services: CM Consult   Living arrangements for the past 2 months: Single Family Home                 DME Arranged: N/A (refused DME) DME Agency: NA       HH Arranged: PT, OT HH Agency: [facility] Date [facility] Agency Contacted: 05/15/22 Time HH Agency Contacted: [phone] Representative spoke with at [facility] Agency: Maralyn Sago   Social Determinants of Health (SDOH) Interventions SDOH Screenings   Food Insecurity: No Food Insecurity (04/24/2022)  Housing: Low Risk  (05/12/2022)  Transportation Needs: No Transportation Needs (04/21/2022)  Utilities: Not At Risk (05/08/2022)  Tobacco Use: High Risk (05/16/2022)    Readmission Risk Interventions    12/30/2020   11:53 AM  Readmission Risk Prevention Plan  HRI or Home Care Consult Patient refused  Social Work Consult for Recovery Care Planning/Counseling Complete  Palliative Care Screening Not Applicable  Medication Review Furniture conservator/restorer) Complete

## 2022-05-22 NOTE — Care Plan (Signed)
Asked to weigh in on endoscopic PEG tube placement. Patient has two contraindications as of right now with the first being infection (SBP) and the second being ascites which is likely not to resolve for some time if ever. With these contraindications, endoscopic PEG tube placement would be unable to safely be placed.  Merlyn Lot MD, MPH [facility]

## 2022-05-23 ENCOUNTER — Other Ambulatory Visit: Payer: Self-pay

## 2022-05-23 DIAGNOSIS — K7031 Alcoholic cirrhosis of liver with ascites: Secondary | ICD-10-CM | POA: Diagnosis not present

## 2022-05-23 DIAGNOSIS — R6520 Severe sepsis without septic shock: Secondary | ICD-10-CM

## 2022-05-23 DIAGNOSIS — E43 Unspecified severe protein-calorie malnutrition: Secondary | ICD-10-CM | POA: Diagnosis not present

## 2022-05-23 DIAGNOSIS — A419 Sepsis, unspecified organism: Secondary | ICD-10-CM

## 2022-05-23 DIAGNOSIS — E870 Hyperosmolality and hypernatremia: Secondary | ICD-10-CM | POA: Diagnosis not present

## 2022-05-23 DIAGNOSIS — Z66 Do not resuscitate: Secondary | ICD-10-CM | POA: Diagnosis not present

## 2022-05-23 DIAGNOSIS — Z515 Encounter for palliative care: Secondary | ICD-10-CM | POA: Diagnosis not present

## 2022-05-23 DIAGNOSIS — K7460 Unspecified cirrhosis of liver: Secondary | ICD-10-CM | POA: Diagnosis not present

## 2022-05-23 DIAGNOSIS — R188 Other ascites: Secondary | ICD-10-CM | POA: Diagnosis not present

## 2022-05-23 LAB — BASIC METABOLIC PANEL
Anion gap: 9 (ref 5–15)
BUN: 32 mg/dL — ABNORMAL HIGH (ref 8–23)
CO2: 22 mmol/L (ref 22–32)
Calcium: 9.8 mg/dL (ref 8.9–10.3)
Chloride: 117 mmol/L — ABNORMAL HIGH (ref 98–111)
Creatinine, Ser: 1.56 mg/dL — ABNORMAL HIGH (ref 0.61–1.24)
GFR, Estimated: 49 mL/min — ABNORMAL LOW (ref >60)
Glucose, Bld: 133 mg/dL — ABNORMAL HIGH (ref 70–99)
Potassium: 3.6 mmol/L (ref 3.5–5.1)
Sodium: 148 mmol/L — ABNORMAL HIGH (ref 135–145)

## 2022-05-23 LAB — AMMONIA: Ammonia: 39 umol/L — ABNORMAL HIGH (ref 9–35)

## 2022-05-23 LAB — LACTIC ACID, PLASMA: Lactic Acid, Venous: 1.9 mmol/L (ref 0.5–1.9)

## 2022-05-23 LAB — CBC
HCT: 30.5 % — ABNORMAL LOW (ref 39.0–52.0)
Hemoglobin: 9.7 g/dL — ABNORMAL LOW (ref 13.0–17.0)
MCH: 31.3 pg (ref 26.0–34.0)
MCHC: 31.8 g/dL (ref 30.0–36.0)
MCV: 98.4 fL (ref 80.0–100.0)
Platelets: 182 10*3/uL (ref 150–400)
RBC: 3.1 MIL/uL — ABNORMAL LOW (ref 4.22–5.81)
RDW: 15 % (ref 11.5–15.5)
WBC: 20.4 10*3/uL — ABNORMAL HIGH (ref 4.0–10.5)
nRBC: 0 % (ref 0.0–0.2)

## 2022-05-23 LAB — MAGNESIUM: Magnesium: 1.8 mg/dL (ref 1.7–2.4)

## 2022-05-23 LAB — PHOSPHORUS: Phosphorus: 2.8 mg/dL (ref 2.5–4.6)

## 2022-05-23 MED ORDER — DEXTROSE 5 % IV SOLN: INTRAVENOUS | Status: DC

## 2022-05-23 MED ORDER — NEPRO/CARBSTEADY PO LIQD
237.0000 mL | Freq: Three times a day (TID) | ORAL | Status: DC
  Administered 2022-05-23 – 2022-05-26 (×4): 237 mL via ORAL

## 2022-05-23 MED ORDER — MAGNESIUM SULFATE IN D5W 1-5 GM/100ML-% IV SOLN
1.0000 g | Freq: Once | INTRAVENOUS | Status: AC
  Administered 2022-05-23: 1 g via INTRAVENOUS
  Filled 2022-05-23: qty 100

## 2022-05-23 NOTE — NC FL2 (Signed)
MEDICAID FL2 LEVEL OF CARE FORM     IDENTIFICATION  Patient Name: Thomas Coffey Birthdate: May 20, 1955 Sex: male Admission Date (Current Location): 04/29/2022  [facility] and [place] Number:  Chiropodist and Address:  [facility], [address], [place], [place] [address]      Provider Number: [id number]  Attending Physician Name and Address:  Pennie Banter, DO  Relative Name and Phone Number:  Marnette Burgess [phone]    Current Level of Care: Hospital Recommended Level of Care: Skilled Nursing Facility (Transition to long term care) Prior Approval Number:    Date Approved/Denied:   PASRR Number: Pending  Discharge Plan: SNF    Current Diagnoses: Patient Active Problem List   Diagnosis Date Noted   Hypernatremia 05/23/2022   Severe sepsis (HCC) 05/23/2022   Cirrhosis of liver with ascites (HCC) 05/21/2022   Goals of care, counseling/discussion 05/21/2022   Palliative care by specialist 05/21/2022   SBP (spontaneous bacterial peritonitis) (HCC) 05/18/2022   Ascites 04/17/2022   Alcoholic cirrhosis of liver with ascites (HCC) 05/09/2022   Anemia 05/04/2022   (HFpEF) heart failure with preserved ejection fraction (HCC) 05/15/2022   SBO (small bowel obstruction) (HCC) 05/16/2022   Chronic pancreatitis (HCC) 04/18/2022   Alcohol-induced acute pancreatitis without infection or necrosis    Colitis    Insect bite of abdomen    Insect bite of left upper arm    Diarrhea 05/23/2021   Alcohol withdrawal delirium (HCC) 05/21/2021   Cellulitis of left axilla 05/19/2021   Essential hypertension 05/19/2021   Hypokalemia 12/29/2020   Aspiration pneumonia (HCC) 12/29/2020   Abdominal pain 06/02/2020   Transaminitis 06/02/2020   Leukocytosis 06/02/2020   AF (paroxysmal atrial fibrillation) (HCC) 06/02/2020   Portal vein thrombosis 06/01/2020   Acute metabolic encephalopathy    Failure to thrive in adult 02/13/2020   COVID-19  01/29/2020   COVID-19 virus infection 01/28/2020   Abnormal LFTs 01/28/2020   CAD (coronary artery disease) 01/28/2020   Atrial fibrillation, chronic (HCC) 01/28/2020   Acute respiratory disease due to COVID-19 virus 01/28/2020   Tobacco abuse 01/28/2020   Acute on chronic pancreatitis (HCC) 07/20/2019   Hypomagnesemia 11/24/2018   Protein-calorie malnutrition, severe 11/23/2018   Pleural effusion on left    Liver function test abnormality    AKI (acute kidney injury) (HCC)    Atrial fibrillation with rapid ventricular response (HCC)    Acute hepatitis 11/17/2018   Atrial fibrillation with RVR (HCC) 11/17/2018   Alcohol abuse 11/17/2018   Tobacco dependence syndrome 11/17/2018   Empyema (HCC)     Orientation RESPIRATION BLADDER Height & Weight     Self, Place  Normal, O2 (2 liters) External catheter Weight: 58.8 kg Height:  5\' 9"  (175.3 cm)  BEHAVIORAL SYMPTOMS/MOOD NEUROLOGICAL BOWEL NUTRITION STATUS      Incontinent Diet (see dc summary)  AMBULATORY STATUS COMMUNICATION OF NEEDS Skin   Extensive Assist Verbally Normal                       Personal Care Assistance Level of Assistance  Bathing, Feeding, Dressing Bathing Assistance: Maximum assistance Feeding assistance: Maximum assistance Dressing Assistance: Maximum assistance     Functional Limitations Info  Sight, Hearing, Speech Sight Info: Adequate Hearing Info: Adequate Speech Info: Adequate    SPECIAL CARE FACTORS FREQUENCY  PT (By licensed PT), OT (By licensed OT)     PT Frequency: 5 times per week OT Frequency: 5 times per  week            Contractures Contractures Info: Not present    Additional Factors Info  Code Status, Allergies Code Status Info: DNR Allergies Info: NKDA           Current Medications (05/23/2022):  This is the current hospital active medication list Current Facility-Administered Medications  Medication Dose Route Frequency Provider Last Rate Last Admin    acetaminophen (TYLENOL) tablet 650 mg  650 mg Oral TID PRN Darlin Priestly, MD   650 mg at 05/18/22 1002   Ampicillin-Sulbactam (UNASYN) 3 g in sodium chloride 0.9 % 100 mL IVPB  3 g Intravenous Q6H Barrie Folk, RPH 200 mL/hr at 05/23/22 1059 3 g at 05/23/22 1059   ciprofloxacin (CIPRO) tablet 500 mg  500 mg Oral Q breakfast Esaw Grandchild A, DO   500 mg at 05/21/22 4098   dextromethorphan-guaiFENesin (MUCINEX DM) 30-600 MG per 12 hr tablet 1 tablet  1 tablet Oral BID Manuela Schwartz, NP   1 tablet at 05/20/22 2233   dextrose 5 % solution   Intravenous Continuous Esaw Grandchild A, DO 100 mL/hr at 05/23/22 1011 New Bag at 05/23/22 1011   dronabinol (MARINOL) capsule 2.5 mg  2.5 mg Oral BID AC Esaw Grandchild A, DO   2.5 mg at 05/20/22 1612   enoxaparin (LOVENOX) injection 40 mg  40 mg Subcutaneous Q24H Floydene Flock, MD   40 mg at 05/22/22 1804   feeding supplement (ENSURE ENLIVE / ENSURE PLUS) liquid 237 mL  237 mL Oral TID BM Esaw Grandchild A, DO   237 mL at 05/20/22 2050   folic acid (FOLVITE) tablet 1 mg  1 mg Oral Daily [name], RPH   1 mg at 05/20/22 1020   Or   folic acid injection 1 mg  1 mg Intravenous Daily [name], RPH   1 mg at 05/21/22 1102   hydrocerin (EUCERIN) cream   Topical BID Floydene Flock, MD   Given at 05/22/22 2106   ipratropium-albuterol (DUONEB) 0.5-2.5 (3) MG/3ML nebulizer solution 3 mL  3 mL Nebulization Q4H PRN Manuela Schwartz, NP       lactulose [facility]) enema 200 gm  300 mL Rectal BID Esaw Grandchild A, DO   300 mL at 05/22/22 2105   lidocaine (LIDODERM) 5 % 1 patch  1 patch Transdermal Q24H Foust, Katy L, NP   1 patch at 05/23/22 0230   LORazepam (ATIVAN) injection 0.5 mg  0.5 mg Intravenous Q4H PRN Floydene Flock, MD   0.5 mg at 05/23/22 1113   methocarbamol (ROBAXIN) tablet 750 mg  750 mg Oral Q8H PRN Esaw Grandchild A, DO   750 mg at 05/20/22 1612   morphine (PF) 2 MG/ML injection 1 mg  1 mg Intravenous Q4H PRN Theotis Burrow, NP    1 mg at 05/23/22 0132   multivitamin with minerals tablet 1 tablet  1 tablet Oral Daily Floydene Flock, MD   1 tablet at 05/20/22 1020   nicotine (NICODERM CQ - dosed in mg/24 hours) patch 21 mg  21 mg Transdermal Daily Floydene Flock, MD   21 mg at 05/23/22 0954   ondansetron (ZOFRAN) tablet 4 mg  4 mg Oral Q6H PRN Floydene Flock, MD       Or   ondansetron [facility]) injection 4 mg  4 mg Intravenous Q6H PRN Floydene Flock, MD   4 mg at 05/21/22 1101   Oral care mouth rinse  15 mL Mouth Rinse PRN Esaw Grandchild A, DO       oxyCODONE (Oxy IR/ROXICODONE) immediate release tablet 5-10 mg  5-10 mg Oral Q6H PRN Esaw Grandchild A, DO   5 mg at 05/20/22 2233   pantoprazole (PROTONIX) EC tablet 40 mg  40 mg Oral BID [name], RPH   40 mg at 05/20/22 2233   potassium chloride SA (KLOR-CON M) CR tablet 20 mEq  20 mEq Oral Daily Floydene Flock, MD   20 mEq at 05/21/22 1610   thiamine (VITAMIN B1) tablet 100 mg  100 mg Oral Daily Floydene Flock, MD   100 mg at 05/20/22 1020   Or   thiamine (VITAMIN B1) injection 100 mg  100 mg Intravenous Daily Floydene Flock, MD   100 mg at 05/23/22 9604     Discharge Medications: Please see discharge summary for a list of discharge medications.  Relevant Imaging Results:  Relevant Lab Results:   Additional Information SS #: [id number]  Ezeriah Luty Seward Carol, RN

## 2022-05-23 NOTE — Progress Notes (Signed)
Nutrition Follow-up  DOCUMENTATION CODES:   Severe malnutrition in context of chronic illness  INTERVENTION:   TPN per pharmacy  Recommend continue daily thiamine, folic acid and MVI in TPN   Pt at high refeed risk; recommend monitor potassium, magnesium and phosphorus labs daily until stable  Daily weights   Nepro Shake po TID, each supplement provides 425 kcal and 19 grams protein  NUTRITION DIAGNOSIS:   Severe Malnutrition related to chronic illness (cirrhosis) as evidenced by severe fat depletion, severe muscle depletion, percent weight loss. -ongoing   GOAL:   Patient will meet greater than or equal to 90% of their needs -not met   MONITOR:   Diet advancement, Labs, Weight trends, Skin, I & O's, Other (Comment) (TPN)  ASSESSMENT:   67 y/o male with h/o etoh abuse, chronic pancreatitis, cirrhosis, gallstones,  HTN, CHF, chronic pain, Afib, CHF, portal vein thrombosis and CAD who is admitted with aspiration PNA, AKI, dysphagia, encephalopathy and pancreatitis.  Pt with multiple failed attempts at NGT. Family is requesting a trial of TPN. Plan is for PICC line and TPN to start tomorrow. Pt is at high refeed risk. Pt is also at high risk for volume overload; will monitor daily weights. No new weight since admission. Pt initiated on a dysphagia 1/nectar thick diet; RD will change Ensure to Nepro.   Medications reviewed and include: ciprofloxacin, marinol, lovenox, folic acid , MVI, nicotine, protonix, KCl, thiamine, unasyn, 5% dextrose @100ml /hr, Mg sulfate   Labs reviewed: Na 148(H), K 3.6 wnl, BUN 32(H), creat 1.56(H), P 2.8 wnl, Mg 1.8 wnl Wbc- 20.4(H), Hgb 9.7(L), Hct 30.5(L)  Diet Order:   Diet Order             DIET - DYS 1 Room service appropriate? No; Fluid consistency: Nectar Thick  Diet effective now                  EDUCATION NEEDS:   Not appropriate for education at this time  Skin:  Skin Assessment: Reviewed RN Assessment  Last BM:  5/7-  via rectal tube  Height:   Ht Readings from Last 1 Encounters:  04/27/2022 5\' 9"  (1.753 m)    Weight:   Wt Readings from Last 1 Encounters:  05/03/2022 58.8 kg    Ideal Body Weight:  72.7 kg  BMI:  Body mass index is 19.14 kg/m.  Estimated Nutritional Needs:   Kcal:  1800-2100kcal/day  Protein:  95-110g/day  Fluid:  1.5-1.7L/day  Betsey Holiday MS, RD, LDN Please refer to [facility] for RD and/or RD on-call/weekend/after hours pager

## 2022-05-23 NOTE — Assessment & Plan Note (Addendum)
5/7: pt now meets severe sepsis from aspiration pneumonia - tachycardia HR 110, leukocytosis (WBC 20.4), encephalopathy and AKI c/w organ dysfunction, thrombocytopenia.  Repeat chest x-ray on 5/9 shows multifocal pneumonia.  Patient was treated with Unasyn, steroids and nebulizer treatments

## 2022-05-23 NOTE — TOC Progression Note (Signed)
Transition of Care [facility]) - Progression Note    Patient Details  Name: Thomas Coffey MRN: [id number] Date of Birth: 08-Aug-1955  Transition of Care [facility]) CM/SW Contact  Marlowe Sax, RN Phone Number: 05/23/2022, 11:15 AM  Clinical Narrative:     PASST Pending, PT recommended STR, FL2 completed, Will send bed search once the patient is more medically stable  Expected Discharge Plan:  (TBD) Barriers to Discharge: Continued Medical Work up  Expected Discharge Plan and Services   Discharge Planning Services: CM Consult   Living arrangements for the past 2 months: Single Family Home                 DME Arranged: N/A (refused DME) DME Agency: NA       HH Arranged: PT, OT HH Agency: [facility] Date HH Agency Contacted: 05/15/22 Time HH Agency Contacted: 1140 Representative spoke with at [facility] Agency: Maralyn Sago   Social Determinants of Health (SDOH) Interventions SDOH Screenings   Food Insecurity: No Food Insecurity (05/10/2022)  Housing: Low Risk  (05/10/2022)  Transportation Needs: No Transportation Needs (05/10/2022)  Utilities: Not At Risk (05/10/2022)  Tobacco Use: High Risk (05/10/2022)    Readmission Risk Interventions    12/30/2020   11:53 AM  Readmission Risk Prevention Plan  HRI or Home Care Consult Patient refused  Social Work Consult for Recovery Care Planning/Counseling Complete  Palliative Care Screening Not Applicable  Medication Review Ocean[facility]) Complete

## 2022-05-23 NOTE — Plan of Care (Signed)

## 2022-05-23 NOTE — Evaluation (Signed)
Clinical/Bedside Swallow Evaluation Patient Details  Name: Thomas Coffey MRN: [id number] Date of Birth: May 19, 1955  Today's Date: 05/23/2022 Time: SLP Start Time (ACUTE ONLY): 1030 SLP Stop Time (ACUTE ONLY): 1130 SLP Time Calculation (min) (ACUTE ONLY): 60 min  Past Medical History:  Past Medical History:  Diagnosis Date   Acute pancreatitis    Alcohol use    CHF (congestive heart failure) (HCC)    COPD (chronic obstructive pulmonary disease) (HCC)    Coronary artery disease    COVID-19 virus infection    positive COVID-19 test on 01/22/20   Hypertension    MI (myocardial infarction) (HCC)    Paroxysmal atrial fibrillation (HCC)    Past Surgical History:  Past Surgical History:  Procedure Laterality Date   ANTERIOR VITRECTOMY Right 10/04/2020   Procedure: ANTERIOR VITRECTOMY;  Surgeon: Nevada Crane, MD;  Location: [facility];  Service: Ophthalmology;  Laterality: Right;   CATARACT EXTRACTION W/PHACO Right 10/04/2020   Procedure: CATARACT EXTRACTION PHACO AND INTRAOCULAR LENS PLACEMENT (IOC) RIGHT VISION BLUE, CAPSULAR TENSION RING 14A  RIGHT 21.84 01:44.7;  Surgeon: Nevada Crane, MD;  Location: [facility];  Service: Ophthalmology;  Laterality: Right;   CATARACT EXTRACTION W/PHACO Left 10/18/2020   Procedure: CATARACT EXTRACTION PHACO AND INTRAOCULAR LENS PLACEMENT (IOC) LEFT 2.95 00:23.9;  Surgeon: Nevada Crane, MD;  Location: [facility];  Service: Ophthalmology;  Laterality: Left;   PARTIAL COLECTOMY     HPI:  Pt is a 67 y.o. male  with past medical history of heavy alcohol use, cachexia, tobacco abuse, chronic alcoholic pancreatitis, Malnutrition COPD, HTN, CAD, A. Fib admitted on 04/30/2022 with worsening abdominal pain for several weeks.  Noted frequent ED visits, 6 in last 6 months. Reportedly drinks about 1 pint per day with a few beers. Current tobacco use and occasional marijuana use.     Found to have ascites, cirrhosis, SBO and AKI   Underwent paracentesis 4/25 2L off  Repeat paracentesis 4/29 1.9L off-SBP (+)     Palliative medicine was consulted for assisting with goals of care conversations in light of worsening cachexia, chronic pancreatitis, alcoholic cirrhosis, frequent admissions and severe malnutrition.  Palliative Care stated Family is now feeling confident that pt's nutrition is "now being addressed with placement of PICC line and TPN".  CXR: New patchy multifocal airspace disease in the left mid and lower  lung concerning for pneumonia. Followup PA and lateral chest X-ray  is recommended in 3-4 weeks following trial of antibiotic therapy to  ensure resolution.  2. Questionable small nodular density in the left costophrenic  angle. Recommend attention to this area on follow-up x-ray.    Pt has been admitted at this facility for 13 days as of this BSE today.    Assessment / Plan / Recommendation  Clinical Impression   Pt seen for BSE today; slightly more responsive to stim. NSG reported he stated wanting "ice cream" to her this morning. However, he was recently given Ativan d/t agitated behaviors in bed; bilat MITTS in place. Upon entering room, pt resting in bed. Mostly nonverbal but responded w/ 2-3 automatic responses given stim/question. Requred MOD-MAX cues for follow through.  On [place] O2 support 3L; afebrile. WBC elevated.  Pt appears to present w/ oropharyngeal phase dysphagia in setting of declined Cognitive status; Baseline ETOH abuse/use prior to admit. Pt has been admitted to hospital for 13 days as of this BSE. He has a baseline of Severe Malnutrition; ETOH abuse/use. Family is eager for TPN  to be initiated per Palliative Care note today. ANY Cognitive decline can impact overall awareness/timing of swallow and safety during po tasks which increases risk for aspiration, choking. Pt's risk for aspiration can be reduced when following aspiration precautions and using a modified diet consistency of pureed foods; Nectar  consistency liquids. Pt is Edentulous at baseline. He required MOD-MAX verbal/visual/tactile cues for follow through during po tasks.        Pt consumed several trials of purees and Nectar liquids via tsp/cup(x3, monitored) w/ No immediate, overt clinical s/s of aspiration noted: no decline in vocal quality; no cough, and no decline in respiratory status during/post trials. O2 sats remained in mid-upper 90s during/post po's. Oral phase was adequate/functional for bolus management and oral clearing of the boluses given. Time given b/t the trials d/t pt exhibiting "munchy" behavior w/ the po trials; oral prep deficits noted -- suspect d/t the decreased awareness from the Cognitive decline.  He was UNABLE to feed himself which can decrease safety of swallow when becoming a dependent feeder. OM Exam was cursory d/t no follow through w/ oral instructions but appeared grossly wfl w/ No unilateral oral weakness noted. MAX confusion during oral care noted -- he pulled away during attempt.         In setting of apparent Cognitive decline, Edentulous status, and his presenting oropharyngeal phase dysphagia w/ risk for aspiration, recommend initiation of the dysphagia level 1(pureed foods moistened for ease of oral phase) w/ Nectar liquids -- tsp/cup monitored; aspiration precautions; reduce Distractions during meals; feeding support and 100% Supervision w/ po's. Pills Crushed in Puree for safer swallowing. MD/NSG updated.  ST services will monitor pt's status while admitted; suspect this is the most beneficial diet consistency for safe oral intake during Acuity of illness -- this was discussed w/ Team. Recommend continued follow w/ Palliative Care for GOC and education re: impact of Cognitive decline on swallowing, and oral intake overall. Dietician f/u. Precautions posted in room. SLP Visit Diagnosis: Dysphagia, oropharyngeal phase (R13.12)    Aspiration Risk  Moderate aspiration risk;Risk for inadequate  nutrition/hydration    Diet Recommendation   ysphagia level 1(pureed foods moistened for ease of oral phase) w/ Nectar liquids -- tsp/cup monitored; aspiration precautions; reduce Distractions during meals; feeding support and 100% Supervision w/ po's.  Medication Administration: Crushed with puree    Other  Recommendations Recommended Consults:  (Dietician following; Palliative Care following) Oral Care Recommendations: Oral care BID;Oral care before and after PO;Staff/trained caregiver to provide oral care Caregiver Recommendations: Avoid jello, ice cream, thin soups, popsicles;Remove water pitcher;Have oral suction available    Recommendations for follow up therapy are one component of a multi-disciplinary discharge planning process, led by the attending physician.  Recommendations may be updated based on patient status, additional functional criteria and insurance authorization.  Follow up Recommendations Follow physician's recommendations for discharge plan and follow up therapies      Assistance Recommended at Discharge  FULL  Functional Status Assessment Patient has had a recent decline in their functional status and/or demonstrates limited ability to make significant improvements in function in a reasonable and predictable amount of time  Frequency and Duration min 2x/week  1 week       Prognosis Prognosis for improved oropharyngeal function: Guarded Barriers to Reach Goals: Cognitive deficits;Language deficits;Time post onset;Severity of deficits;Behavior Barriers/Prognosis Comment: baseline Cognitive decline; ETOH abuse/use; malnutrition      Swallow Study   General Date of Onset: 04/27/2022 HPI: Pt is a 67 y.o. male  with past medical history of heavy alcohol use, cachexia, tobacco abuse, chronic alcoholic pancreatitis, Malnutrition COPD, HTN, CAD, A. Fib admitted on 05/14/2022 with worsening abdominal pain for several weeks.  Noted frequent ED visits, 6 in last 6 months.  Reportedly drinks about 1 pint per day with a few beers. Current tobacco use and occasional marijuana use.     Found to have ascites, cirrhosis, SBO and AKI  Underwent paracentesis 4/25 2L off  Repeat paracentesis 4/29 1.9L off-SBP (+)     Palliative medicine was consulted for assisting with goals of care conversations in light of worsening cachexia, chronic pancreatitis, alcoholic cirrhosis, frequent admissions and severe malnutrition.  Palliative Care stated Family is now feeling confident that pt's nutrition is "now being addressed with placement of PICC line and TPN".  CXR: New patchy multifocal airspace disease in the left mid and lower  lung concerning for pneumonia. Followup PA and lateral chest X-ray  is recommended in 3-4 weeks following trial of antibiotic therapy to  ensure resolution.  2. Questionable small nodular density in the left costophrenic  angle. Recommend attention to this area on follow-up x-ray.    Pt has been admitted at this facility for 13 days as of this BSE today. Type of Study: Bedside Swallow Evaluation Previous Swallow Assessment: BSE 2022 - dys. 2 w/ thins then Diet Prior to this Study: NPO Temperature Spikes Noted: No (wbc elevated) Respiratory Status: Nasal cannula (3L) History of Recent Intubation: No Behavior/Cognition: Cooperative;Confused;Distractible;Requires cueing;Doesn't follow directions Oral Cavity Assessment: Dry Oral Care Completed by SLP: Yes (attempted but pt pulled away) Oral Cavity - Dentition: Edentulous Vision:  (n/a) Self-Feeding Abilities: Total assist Patient Positioning: Upright in bed (needed full assistance) Baseline Vocal Quality:  (mumbled/muttered words - few) Volitional Cough: Cognitively unable to elicit Volitional Swallow: Unable to elicit    Oral/Motor/Sensory Function Overall Oral Motor/Sensory Function:  (no overt unilateral oral weakness)   Ice Chips Ice chips: Not tested   Thin Liquid Thin Liquid: Not tested    Nectar Thick  Nectar Thick Liquid: Impaired Presentation: Spoon (10 trials) Oral Phase Impairments: Poor awareness of bolus;Reduced lingual movement/coordination;Reduced labial seal (oral prep deficits) Oral phase functional implications: Prolonged oral transit (munching) Pharyngeal Phase Impairments: Suspected delayed Swallow (no overt s/s)   Honey Thick Honey Thick Liquid: Not tested   Puree Puree: Impaired Presentation: Spoon (fed; 10 trials) Oral Phase Impairments: Reduced labial seal;Reduced lingual movement/coordination;Poor awareness of bolus Oral Phase Functional Implications: Prolonged oral transit (munching behavior) Pharyngeal Phase Impairments:  (no overt s/s)   Solid     Solid: Not tested         Jerilynn Som, MS, CCC-SLP Speech Language Pathologist Rehab Services; [facility] [phone] (ascom) Rondel Episcopo 05/23/2022,5:24 PM

## 2022-05-23 NOTE — Progress Notes (Signed)
Palliative Care Progress Note, Assessment & Plan   Patient Name: Thomas Coffey       Date: 05/23/2022 DOB: 02-05-1955  Age: 67 y.o. MRN#: [id number] Attending Physician: Pennie Banter, DO Primary Care Physician: Pcp, No Admit Date: 05/10/2022  Subjective: Patient is lying in bed in no apparent distress.  He is sleeping but is easily arousable.  He acknowledges my presence but does not make any vocalizations during my visit.  He quickly returns to sleep and does not participate in discussions with me.  No family or friends present during my visit.  HPI: 67 y.o. male  with past medical history of heavy alcohol use, tobacco abuse, chronic alcoholic pancreatitis, COPD, HTN, CAD, A. Fib admitted on 05/10/2022 with worsening abdominal pain for several weeks.   Noted frequent ED visits, 6 in last 6 months. Reportedly drinks about 1 pint per day with a few beers. Current tobacco use and occasional marijuana use.   Found to have ascites, cirrhosis, SBO and AKI Underwent paracentesis 4/25 2L off Repeat paracentesis 4/29 1.9L off-SBP (+)   Palliative medicine was consulted for assisting with goals of care conversations in light of worsening cachexia, chronic pancreatitis, alcoholic cirrhosis, frequent admissions and severe malnutrition.  Summary of counseling/coordination of care: After reviewing the patient's chart and assessing the patient at bedside, I counseled with SLP therapist [name].  We discussed patient's poor nutritional intake and inability to have PEG placed. However, patient is able to tolerate nectar thick liquids.  As per chart review, plan is for TPN to be given for nutritional support and for discharge to STR. TOC following closely for discharge planning.  I spoke with patient's son  Thomas Coffey and son Thomas Coffey over the phone.  They shared they are concerned that patient's nutrition is poor and feel confident it is now being addressed with placement of PICC line and TPN.  They also share concern with patient's mobility.  We discussed that PT is working with patient to maximize strength.  I attempted to elicit goals and values important to patient's family.  They share they want to get nutrition increased, have patient move more, and have him transferred to rehab.  They believe all of his concerns and issues are being addressed by primary team at this time.  No acute palliative needs.  PMT contact info given and family encouraged to call with any future palliative needs.  Family made aware that PMT will continue to follow and monitor the patient peripherally.  Physical Exam Vitals reviewed.  Constitutional:      General: He is not in acute distress.    Appearance: He is not ill-appearing.  HENT:     Head: Normocephalic.  Cardiovascular:     Rate and Rhythm: Normal rate.  Pulmonary:     Breath sounds: Normal breath sounds.  Skin:    General: Skin is warm and dry.  Neurological:     Comments: Non verbal  Psychiatric:        Mood and Affect: Mood is not anxious.        Behavior: Behavior is not agitated.             Total Time 35 minutes  [name], FNP-BC Palliative Medicine Team Team Phone # [phone]

## 2022-05-23 NOTE — Progress Notes (Signed)
PROGRESS NOTE    Thomas Coffey  ZOX:[id number] DOB: 02-Dec-1955 DOA: 04/30/2022 PCP: Pcp, No  150A/150A-AA  LOS: 12 days   Brief hospital course:  Thomas Coffey is a 67 y.o. male with medical history significant of heavy alcohol use, tobacco abuse, substance use, alcoholic pancreatitis, COPD, hypertension, coronary artery disease, atrial fibrillation presenting with worsening abdominal pain for several weeks.    Patient is known to be seen at least 4-5 times in the ER over the past month.  Baseline heavy alcohol use.  Drinks at least 1 pint a day.  Also with at least 1-2 beers.  Patient states has been trying to decrease his use.  1 pack/day smoker.  Occasional marijuana use.   Patient was found to have SBP on repeat paracentesis (initial was false negative due to antibiotics given before fluid sample collected).  Further hospital course and management as outlined below.   Assessment & Plan:   * SBP --paracentesis on 4/25 with 2L removed.  Fluid study neg for SBP at that time, however, paracentesis was done after 1 dose of ceftriaxone.  ceftriaxone d/c'ed then. --repeat paracentesis on 4/29 with 1.9L removed, ascites fluid cell count consistent with SBP.  --completed ceftriaxone - 5 day course --start PO cipro prophylaxis --peritoneal fluid cultures - negative, final  Dysphagia - seems due to encephalopathy Severe Protein Calorie Malnutrition - related to chronic illnesses including chronic pancreatitis, cirrhosis, alcohol dependence, likely socioeconomic factors --Appreciate RD's recommendations --Tube feeds recommended if pt agreeable -- he refused / pulled out NGT x 2 attempts --Palliative following for goals of care discussions as overall prognosis is very poor 05/19/22 - pt declined NG tube placement for tube feeds to me on rounds 05/20/22 - family want to attempt NG tube feeds, met w/ palliative 05/21/22 - reattempt NG placement, pt unable to tolerate 05/23/22 - family request  to start TPN --NPO --SLP for swallow eval when he can participate -- needs to be awake & alert, following commands, talking --Started trial of low dose Marinol for appetite stimulation - but now NPO --PEG contraindicated given his ascites and SBP, GI agrees --Trial of TPN -- to start tomorrow 5/8, orders placed --PICC line ordered  --RD & pharmacy following  Aspiration Pneumonia - see above. Chest xray 5/6 -- new patchy multifocal pneumonia on left. --continue Unasyn --symptomatic care PRN --monitor spO2, supplement to keep > 90% --GOC: family would want intubation/MV if required, no CPR/ACLS if pulseless  Acute metabolic encephalopathy - multifactorial, due to medications, hepatic enc., severe malnutrition Hepatic Encephalopathy --RN noted more confusion on 4/29, possibly due to SBP.   Mental status improved after starting abx, but has significantly declined past few days (5/4 >> 5/6) 5/5 found ammonia elevated. 5/6 found to have new pneumonia likely aspiration --delirium precautions. --given lactulose enemas per rectal tube, ammonia improved --hold lactulose enemas for now --monitor ammonia levels  Hypernatremia - due to NPO status x couple of days now --change fluids to D5w for free water replacement --monitor BMP  Alcoholic cirrhosis of liver with ascites (HCC) + cirrhosis on imaging w/ moderate diffuse ascites pending paracentesis  Long standing ETOH abuse  AST 53, ALT 15, T bili 3.9, INR 1.9, Cr 1.5  MELD score 26  --home lasix and aldactone resumed on 4/28 after BP improved --5/3 -- hold lasix and aldactone -- Cr rising, starting fluids  Acute Kidney Injury - likely pre-renal given very poor PO intake since admission. Cr baseline 1 month ago was 1.02-1.19. Cr  on admission 1.50, trending upwards, today Cr 2.05 >> 1.87 >>...1.64 improving on fluids. At high risk for hepatorenal syndrome. --Continue IV fluids, now on D5w for hypernatremia  --Monitor BMP --Renally dose  meds, avoid nephrotoxins and hypotension --Further eval if not improving on fluids  SBO, ruled out --GenSurg consulted on admission, ruled out SBO  Abdominal pain - due to SBP, chronic pancreatitis Generalized abdominal pain on presentation.  Improved after paracentesis. Improved. --abx for SBP completed --supportive care  Hypotension - POA. Resolved. --BP 80's, improved with midodrine 10 mg TID (new) --now off midodrine & BP's stable, soft at times --maintain MAP>65 --monitor BP's closely  Alcohol abuse Roughly 1 pint of liquor as well as 1-2 beers daily No significant withdrawal Thiamine, folate, multivitamin  Lumbar degenerative joint & disc disease Lumbar muscle spasms See lumbar xrays of 5/2 AM 5/3 pain uncontrolled on tramadol --Robaxin PRN  --Oxycodone PRN  >> IV morphine low dose since NPO --Mobilize / OOB   Anemia  --received 1u pRBC for Hgb 6.7.   --anemia workup neg for significant def --monitor and transfuse to keep Hgb >7  Chronic pancreatitis (HCC) Chronic pancreatitis on imaging Lipase 46  Essential hypertension, not currently active --BP low  Tobacco abuse 1 pack/day smoker Discussed cessation with admitting physician Nicotine patch  Atrial fibrillation with rapid ventricular response (HCC) EKG was sinus tachycardia on presentation Does not appear to be on medical treatment  Hyponatremia, mild - resolved --possibly due to cirrhosis --Monitor BMP  CHF, ruled out --last Echo in 2020 showed normal systolic and diastolic fx.   DVT prophylaxis: Lovenox SQ Code Status: Full code  Family Communication:  Level of care: Med-Surg Dispo:   The patient is from: home Anticipated d/c is to: home Anticipated d/c date is: TBD pending peritoneal fluid cultures   Subjective and Interval History:  Pt sleeping when seen this AM, no family present at bedside.  His daughter arrived later this AM.  Discussed failed attempts at NG tube placement.  She  wishes to proceed with TPN, to give patient a chance for improvement and regain ability to eat and drink.  Pt woke briefly, remains confused, unable to express complaints or needs. Daughter reports he's been wanting to get up out of the bed when he wakes up for her.     Objective: Vitals:   05/22/22 0745 05/22/22 1435 05/23/22 0020 05/23/22 0700  BP: (!) 148/81 111/81 115/61 113/75  Pulse: (!) 109 (!) 105 (!) 110 (!) 105  Resp: 20 20 18 18   Temp: 98.2 F (36.8 C) 98.3 F (36.8 C) (!) 97.5 F (36.4 C) 98 F (36.7 C)  TempSrc: Oral Oral Oral Oral  SpO2: 93% 100% 94% 96%  Weight:      Height:        Intake/Output Summary (Last 24 hours) at 05/23/2022 1433 Last data filed at 05/23/2022 1247 Gross per 24 hour  Intake 1772.07 ml  Output 600 ml  Net 1172.07 ml    Filed Weights   05/16/2022 0741 05/02/2022 1200  Weight: 59.1 kg 58.8 kg    Examination:   General exam: somnolent, no acute distress, cachectic, severely malnourished HEENT: moist mucus membranes, hearing grossly normal  Respiratory system: on room air, normal respiratory effort. CTA diminished on L>R Cardiovascular system: RRR, no pedal edema.   Gastrointestinal system: mildly distended, non-tender, umbilical hernia noted Central nervous system: limited exam, pt somnolent Extremities: moves all, no edema, normal tone Skin: dry, intact, normal temperature Psychiatry: depressed mood,  flat affect    Data Reviewed: I have personally reviewed labs and imaging studies  Notable labs --- Na 148, Cl 117, glucose 133, BUN 32, Cr 1.64 >> 1.56, ammonia 31 >> 39, WBC 9.8 >> 20.4k, Hbg 9.7  Micro -  Peritoneal fluid cultures 4/29  - negative, final. Blood cultures 5/7 -- pending - neg to date   Cytology - no malignant cells seen on pathologist smear of peritoneal fluid    Time spent: 46 minutes  Pennie Banter, DO [facility] If 7PM-7AM, please contact night-coverage 05/23/2022, 2:33 PM

## 2022-05-24 DIAGNOSIS — K7460 Unspecified cirrhosis of liver: Secondary | ICD-10-CM | POA: Diagnosis not present

## 2022-05-24 DIAGNOSIS — R188 Other ascites: Secondary | ICD-10-CM | POA: Diagnosis not present

## 2022-05-24 DIAGNOSIS — F1010 Alcohol abuse, uncomplicated: Secondary | ICD-10-CM | POA: Diagnosis not present

## 2022-05-24 DIAGNOSIS — D509 Iron deficiency anemia, unspecified: Secondary | ICD-10-CM | POA: Insufficient documentation

## 2022-05-24 DIAGNOSIS — K652 Spontaneous bacterial peritonitis: Secondary | ICD-10-CM | POA: Diagnosis not present

## 2022-05-24 DIAGNOSIS — E43 Unspecified severe protein-calorie malnutrition: Secondary | ICD-10-CM | POA: Diagnosis not present

## 2022-05-24 DIAGNOSIS — E876 Hypokalemia: Secondary | ICD-10-CM

## 2022-05-24 DIAGNOSIS — Z515 Encounter for palliative care: Secondary | ICD-10-CM | POA: Diagnosis not present

## 2022-05-24 DIAGNOSIS — I480 Paroxysmal atrial fibrillation: Secondary | ICD-10-CM

## 2022-05-24 DIAGNOSIS — D508 Other iron deficiency anemias: Secondary | ICD-10-CM

## 2022-05-24 DIAGNOSIS — A419 Sepsis, unspecified organism: Secondary | ICD-10-CM | POA: Diagnosis not present

## 2022-05-24 LAB — MAGNESIUM: Magnesium: 2 mg/dL (ref 1.7–2.4)

## 2022-05-24 LAB — COMPREHENSIVE METABOLIC PANEL
ALT: 28 U/L (ref 0–44)
AST: 60 U/L — ABNORMAL HIGH (ref 15–41)
Albumin: 2 g/dL — ABNORMAL LOW (ref 3.5–5.0)
Alkaline Phosphatase: 91 U/L (ref 38–126)
Anion gap: 8 (ref 5–15)
BUN: 27 mg/dL — ABNORMAL HIGH (ref 8–23)
CO2: 22 mmol/L (ref 22–32)
Calcium: 9.2 mg/dL (ref 8.9–10.3)
Chloride: 114 mmol/L — ABNORMAL HIGH (ref 98–111)
Creatinine, Ser: 1.19 mg/dL (ref 0.61–1.24)
GFR, Estimated: >60 mL/min (ref >60)
Glucose, Bld: 170 mg/dL — ABNORMAL HIGH (ref 70–99)
Potassium: 3.3 mmol/L — ABNORMAL LOW (ref 3.5–5.1)
Sodium: 144 mmol/L (ref 135–145)
Total Bilirubin: 2.5 mg/dL — ABNORMAL HIGH (ref 0.3–1.2)
Total Protein: 6.5 g/dL (ref 6.5–8.1)

## 2022-05-24 LAB — CBC
HCT: 28.9 % — ABNORMAL LOW (ref 39.0–52.0)
Hemoglobin: 9.3 g/dL — ABNORMAL LOW (ref 13.0–17.0)
MCH: 31.5 pg (ref 26.0–34.0)
MCHC: 32.2 g/dL (ref 30.0–36.0)
MCV: 98 fL (ref 80.0–100.0)
Platelets: 138 10*3/uL — ABNORMAL LOW (ref 150–400)
RBC: 2.95 MIL/uL — ABNORMAL LOW (ref 4.22–5.81)
RDW: 15.1 % (ref 11.5–15.5)
WBC: 18.5 10*3/uL — ABNORMAL HIGH (ref 4.0–10.5)
nRBC: 0 % (ref 0.0–0.2)

## 2022-05-24 LAB — PHOSPHORUS: Phosphorus: 2.5 mg/dL (ref 2.5–4.6)

## 2022-05-24 LAB — AMMONIA: Ammonia: 53 umol/L — ABNORMAL HIGH (ref 9–35)

## 2022-05-24 MED ORDER — DEXTROSE 5 % IV SOLN: INTRAVENOUS | Status: AC

## 2022-05-24 MED ORDER — SODIUM CHLORIDE 0.9% FLUSH
10.0000 mL | Freq: Two times a day (BID) | INTRAVENOUS | Status: DC
  Administered 2022-05-24 – 2022-05-26 (×4): 10 mL

## 2022-05-24 MED ORDER — INSULIN ASPART 100 UNIT/ML IJ SOLN
0.0000 [IU] | INTRAMUSCULAR | Status: DC
  Administered 2022-05-25 (×2): 2 [IU] via SUBCUTANEOUS
  Filled 2022-05-24 (×2): qty 1

## 2022-05-24 MED ORDER — TRACE MINERALS CU-MN-SE-ZN 300-55-60-3000 MCG/ML IV SOLN
INTRAVENOUS | Status: AC
  Filled 2022-05-24: qty 302.4

## 2022-05-24 MED ORDER — CHLORHEXIDINE GLUCONATE CLOTH 2 % EX PADS
6.0000 | MEDICATED_PAD | Freq: Every day | CUTANEOUS | Status: DC
  Administered 2022-05-24 – 2022-05-26 (×3): 6 via TOPICAL

## 2022-05-24 MED ORDER — SODIUM CHLORIDE 0.9% FLUSH: 10.0000 mL | INTRAVENOUS | Status: DC | PRN

## 2022-05-24 MED ORDER — POTASSIUM CHLORIDE 10 MEQ/100ML IV SOLN
10.0000 meq | INTRAVENOUS | Status: AC
  Administered 2022-05-24 (×3): 10 meq via INTRAVENOUS
  Filled 2022-05-24 (×3): qty 100

## 2022-05-24 NOTE — Progress Notes (Signed)
Progress Note   Patient: Thomas Coffey ZOX:[id number] DOB: 01-Sep-1955 DOA: 04/26/2022     13 DOS: the patient was seen and examined on 05/24/2022   Brief hospital course: 67 y.o. male with medical history significant of heavy alcohol use, tobacco abuse, substance use, alcoholic pancreatitis, COPD, hypertension, coronary artery disease, atrial fibrillation presenting with worsening abdominal pain for several weeks.     Patient is known to be seen at least 4-5 times in the ER over the past month.  Baseline heavy alcohol use.  Drinks at least 1 pint a day.  Also with at least 1-2 beers.  Patient states has been trying to decrease his use.  1 pack/day smoker.  Occasional marijuana use.  Paracentesis on 4/25 with 2 L removed.  Paracentesis was done after 1 dose of ceftriaxone.  Patient has dysphagia and severe protein calorie malnutrition.  Family wanted to do TPN for nutrition.  On dysphagia diet.  Unable to do a PEG tube with ascites.  Aspiration pneumonia and started on Unasyn on 05/22/2022.  Assessment and Plan: * Severe sepsis (HCC) 5/7: pt now meets severe sepsis from aspiration pneumonia - tachycardia HR 110, leukocytosis (WBC 20.4), encephalopathy and AKI c/w organ dysfunction, thrombocytopenia.  Continue Unasyn.   Spontaneous bacterial peritonitis (HCC) Paracentesis on 4/25 with 2 L removed.  Patient received 5-day course of Rocephin and now on p.o. Cipro prophylaxis.  Protein-calorie malnutrition, severe Body mass index is 19.14 kg/m. Related to chronic illnesses -- alcoholism, liver cirrhosis, chronic pancreatitis Patient is a DO NOT RESUSCITATE.  Not a candidate for PEG with ascites.  Starting TPN.  Overall prognosis is poor.  Alcoholic cirrhosis of liver with ascites (HCC) Patient with hepatic encephalopathy, elevated liver function tests, coagulopathy   Alcohol abuse No signs of withdrawal  Iron deficiency anemia Ferritin 66, last hemoglobin  9.3  Hypernatremia Improved on D5W.  Chronic pancreatitis (HCC) Chronic pancreatitis on imaging   SBO (small bowel obstruction) (HCC) Patient does not have small bowel obstruction  (HFpEF) heart failure with preserved ejection fraction (HCC) 2D echo 10/2018 with EF 60-65% Watch with IV fluids and TPN.  Essential hypertension Currently not on any blood pressure medication.   Hypokalemia Replace with IV potassium runs today.  Paroxysmal atrial fibrillation (HCC) EKG with sinus tachycardia on admission.  Tobacco abuse Nicotine patch  AKI (acute kidney injury) (HCC) Creatinine peaked at 2.05 on 5/3.  Last creatinine 1.19.        Subjective: Patient lethargic this morning.  Unable to give much history.  Admitted with cirrhosis with ascites.  Physical Exam: Vitals:   05/23/22 0700 05/23/22 1615 05/23/22 2341 05/24/22 0759  BP: 113/75 115/78 105/89 134/83  Pulse: (!) 105 (!) 102 (!) 108 (!) 101  Resp: 18 18 20 18   Temp: 98 F (36.7 C) 97.7 F (36.5 C) 98.3 F (36.8 C)   TempSrc: Oral     SpO2: 96% 100% 99% 100%  Weight:      Height:       Physical Exam HENT:     Head: Normocephalic.     Mouth/Throat:     Pharynx: No oropharyngeal exudate.  Eyes:     General: Lids are normal.     Conjunctiva/sclera: Conjunctivae normal.  Cardiovascular:     Rate and Rhythm: Normal rate and regular rhythm.     Heart sounds: Normal heart sounds, S1 normal and S2 normal.  Pulmonary:     Breath sounds: Examination of the right-lower field reveals decreased breath sounds.  Examination of the left-lower field reveals decreased breath sounds. Decreased breath sounds present. No wheezing, rhonchi or rales.  Abdominal:     General: There is distension.     Palpations: Abdomen is soft.     Tenderness: There is no abdominal tenderness.  Musculoskeletal:     Right lower leg: No swelling.     Left lower leg: No swelling.  Skin:    General: Skin is warm.     Findings: No rash.   Neurological:     Mental Status: He is lethargic.     Data Reviewed: White blood cell count 18.5, hemoglobin 9.3, platelet count 138  Family Communication: Updated patient's daughter on the phone  Disposition: Status is: Inpatient Remains inpatient appropriate because: Will start TPN this evening.  Overall prognosis is poor.  High risk for cardiopulmonary arrest.  Patient is a DNR.  Planned Discharge Destination: May have difficulty finding placement for him on TPN.    Time spent: 27 minutes  Author: Alford Highland, MD 05/24/2022 1:34 PM  For on call review [email or web address].

## 2022-05-24 NOTE — Assessment & Plan Note (Addendum)
2D echo 10/2018 with EF 60-65% Watch with TPN.

## 2022-05-24 NOTE — Progress Notes (Signed)
       CROSS COVER NOTE  NAME: MAN MOM MRN: [id number] DOB : [birth year]    HPI/Events of Note   Report:requested by nurse to come to bedside cbecause they did not understand plan of care  On review of chart: Admitted with alcoholic pancreatitis, possible SBO, severe protein calorie malnutrition  and ascites - paracentesis 4/25 with 2L fluid removved. Newly started on unasyn for aspiration pneumonia    Assessment and  Interventions   Assessment: Neuro - lethargic Abdomen - slight overtly distended, soft Plan: Family concern about patient abdomen looking more distended, explained cirrhosis and likely recurrence of ascites would happen and patient is evaluated daily by the attending Due to lethargy and now pneumoniz, discontinue ativan, treat agitation as needed        Donnie Mesa NP [facility]

## 2022-05-24 NOTE — Progress Notes (Signed)
Peripherally Inserted Central Catheter Placement  The IV Nurse has discussed with the patient and/or persons authorized to consent for the patient, the purpose of this procedure and the potential benefits and risks involved with this procedure.  The benefits include less needle sticks, lab draws from the catheter, and the patient may be discharged home with the catheter. Risks include, but not limited to, infection, bleeding, blood clot (thrombus formation), and puncture of an artery; nerve damage and irregular heartbeat and possibility to perform a PICC exchange if needed/ordered by physician.  Alternatives to this procedure were also discussed.  Bard Power PICC patient education guide, fact sheet on infection prevention and patient information card has been provided to patient /or left at bedside.    PICC Placement Documentation  PICC Double Lumen 05/24/22 Right Brachial 39 cm 0 cm (Active)  Indication for Insertion or Continuance of Line Administration of hyperosmolar/irritating solutions (i.e. TPN, Vancomycin, etc.) 05/24/22 1558  Exposed Catheter (cm) 0 cm 05/24/22 1558  Lumen #1 Status Flushed;Blood return noted 05/24/22 1558  Lumen #2 Status Flushed;Blood return noted 05/24/22 1558  Dressing Type Transparent 05/24/22 1558  Dressing Status Antimicrobial disc in place 05/24/22 1558  Line Adjustment (NICU/IV Team Only) No 05/24/22 1558  Dressing Change Due 05/31/22 05/24/22 1558       Romie Jumper 05/24/2022, 4:01 PM

## 2022-05-24 NOTE — Assessment & Plan Note (Addendum)
Starting Aldactone which should help out with potassium

## 2022-05-24 NOTE — TOC Progression Note (Signed)
Transition of Care [facility]) - Progression Note    Patient Details  Name: Thomas Coffey MRN: [id number] Date of Birth: 03/05/1955  Transition of Care [facility]) CM/SW Contact  Marlowe Sax, RN Phone Number: 05/24/2022, 10:17 AM  Clinical Narrative:    PASSR still pending Uploaded clinical documents to [place] MUST to obtain PASSR, once more medically ready will need 30 day note sent to [place] MUSTas well, Bedsearch will need to be sent once medically ready to find a STR bed that will transition to Longterm care bed   Expected Discharge Plan:  (TBD) Barriers to Discharge: Continued Medical Work up  Expected Discharge Plan and Services   Discharge Planning Services: CM Consult   Living arrangements for the past 2 months: Single Family Home                 DME Arranged: N/A (refused DME) DME Agency: NA       HH Arranged: PT, OT HH Agency: [facility] Date [facility] Agency Contacted: 05/15/22 Time HH Agency Contacted: 1140 Representative spoke with at [facility] Agency: Maralyn Sago   Social Determinants of Health (SDOH) Interventions SDOH Screenings   Food Insecurity: No Food Insecurity (04/21/2022)  Housing: Low Risk  (04/28/2022)  Transportation Needs: No Transportation Needs (04/25/2022)  Utilities: Not At Risk (04/23/2022)  Tobacco Use: High Risk (05/05/2022)    Readmission Risk Interventions    12/30/2020   11:53 AM  Readmission Risk Prevention Plan  HRI or Home Care Consult Patient refused  Social Work Consult for Recovery Care Planning/Counseling Complete  Palliative Care Screening Not Applicable  Medication Review Oceanographer) Complete

## 2022-05-24 NOTE — Assessment & Plan Note (Signed)
Chronic pancreatitis on imaging

## 2022-05-24 NOTE — Progress Notes (Addendum)
PHARMACY - TOTAL PARENTERAL NUTRITION CONSULT NOTE   Indication: Severe malnutrition, no PO intake, NPO status, intolerance to NG tube for enteric feeds   Patient Measurements: Height: 5\' 9"  (175.3 cm) Weight: 58.8 kg (129 lb 10.1 oz) IBW/kg (Calculated) : 70.7 TPN AdjBW (KG): 58.8 Body mass index is 19.14 kg/m. Usual Weight:    Assessment: medical history significant of heavy alcohol use, tobacco abuse, substance use, chronic alcoholic pancreatitis, COPD, hypertension, coronary artery disease, atrial fibrillation presenting with worsening abdominal pain for several weeks.  -patient with SBP,  antibiotic therapy completed -Hepatic encephalopathy. Ammonia improved s/p lactulose enemas. Patient pulled out multiple NG Tubes. PEG contraindicated given ascites and SBP -Aspiration Pneumonia  Glucose / Insulin: BG 133-170 SSI q4h ordered to start tonight after TPN started Electrolytes: K 3.3   ordered KCL 10 meq IV x 3 Renal: Scr 1.19 Hepatic: AST/ALT  58/24  Total bilirubin 2.5 Ammonia 53 Intake / Output; MIVF: none GI Imaging: GI Surgeries / Procedures:   Central access: PICC pending 5/8 TPN start date: planning on start 5/8  Nutritional Goals: Goal TPN rate is 62 mL/hr (provides 93.7 g of protein and 1805 kcals per day)  RD Assessment: Estimated Needs Total Energy Estimated Needs: 1800-2100kcal/day Total Protein Estimated Needs: 95-110g/day Total Fluid Estimated Needs: 1.5-1.7L/day  Current Nutrition:  Dysphagia 1 Diet  Plan:  Start TPN at 30 mL/hr at 1800  (half-rate) Protein -Clinisol 15%  63 g/L Dextrose 18% Lipids 35 g/L   Electrolytes in TPN: Na 64mEq/L, K 49mEq/L, Ca 62mEq/L, Mg 41mEq/L, and Phos 44mmol/L. Cl:Ac 1:1 K 3.3  Ordered KCL 10 meq IV x 3 doses Add standard MVI and trace elements to TPN Patient receiving Folic Acid IV and Thiamine IV outside of TPN currently per protocol Initiate Sensitive q4h SSI and adjust as needed  Monitor TPN labs Daily x First 3  days then Mon/Thurs  Nester Bachus A 05/24/2022,10:34 AM

## 2022-05-24 NOTE — Progress Notes (Addendum)
SLP F/U Note  Patient Details Name: TYLEN BAAB MRN: [id number] DOB: Mar 03, 1955   F/u Note:       Reason Eval/Treat Not Completed:  (chart reviewed; consulted NSG re: pt's status today)  NSG reported pt has taken few bites/sips of Dysphagia diet w/ her today w/ no overt s/s of aspiration nor decline in status. Pt was fidgity and moving about in bed; bilat. Mitts+.  Pt w/ current hepatic encephalopathy; Severe protein-calorie malnutrition in setting of chronic illnesses -- alcoholism, liver cirrhosis, chronic pancreatitis. The Family has asked for TPN to be initiated.  Recommend continue current Dysphagia diet w/ aspiration precautions and full feeding assistance/Supervision w/ all po's by NSG staff to help unsure safe oral intake. This diet consistency would be recommended now and for Discharge for safer oral intake until pt's medical and Cognitive status' improve, before any consideration of diet upgrade. This was discussed w/ Family and NSG present in room; then MD and Palliative Care. ST services can be available for needs, education.       Jerilynn Som, MS, CCC-SLP Speech Language Pathologist Rehab Services; [facility] [phone] (ascom) Yoseline Andersson 05/24/2022, 1:41 PM

## 2022-05-24 NOTE — Progress Notes (Signed)
                                                     Palliative Care Progress Note, Assessment & Plan   Patient Name: Thomas Coffey       Date: 05/24/2022 DOB: [birth year]  Age: 67 y.o. MRN#: [id number] Attending Physician: Alford Highland, MD Primary Care Physician: Pcp, No Admit Date: 04/21/2022  Subjective: Patient is lying in bed with mitts in place.  He is sleeping but easily arousable.  He acknowledges my presence.  He is requesting to be readjusted in the bed.  With help of nurse tech, we repositioned patient.  Patient has no acute complaints at this time.  No family or friends present at bedside during my visit.  HPI: 68 y.o. male  with past medical history of heavy alcohol use, tobacco abuse, chronic alcoholic pancreatitis, COPD, HTN, CAD, A. Fib admitted on 05/09/2022 with worsening abdominal pain for several weeks.   Noted frequent ED visits, 6 in last 6 months. Reportedly drinks about 1 pint per day with a few beers. Current tobacco use and occasional marijuana use.   Found to have ascites, cirrhosis, SBO and AKI Underwent paracentesis 4/25 2L off Repeat paracentesis 4/29 1.9L off-SBP (+)   Palliative medicine was consulted for assisting with goals of care conversations in light of worsening cachexia, chronic pancreatitis, alcoholic cirrhosis, frequent admissions and severe malnutrition.  Summary of counseling/coordination of care: I have reviewed the patient's chart extensively and assessed the patient.    No acute palliative needs today.    Symptoms assessed and symptom burden remains low.  Pt had no actue complaints of pain or discomfort on PE, despite abdomen being distended. No adjustments to medications needed at this time.  TOC following closely for discharge planning.  DNR remains.  PMT will continue to follow and support patient  and family throughout his hospitalization.  Physical Exam Constitutional:      General: He is not in acute distress. HENT:     Head: Normocephalic.     Comments: Temporal wasting Cardiovascular:     Rate and Rhythm: Normal rate.  Pulmonary:     Effort: Pulmonary effort is normal.  Abdominal:     Comments: distended  Musculoskeletal:     Cervical back: Normal range of motion.  Neurological:     Mental Status: He is alert.  Psychiatric:        Mood and Affect: Mood is not anxious.        Behavior: Behavior is not agitated.             Total Time 25 minutes   Mackinley Cassaday L. Manon Hilding, FNP-BC Palliative Medicine Team Team Phone # [phone]

## 2022-05-24 NOTE — Hospital Course (Addendum)
67 y.o. male with medical history significant of heavy alcohol use, tobacco abuse, substance use, alcoholic pancreatitis, COPD, hypertension, coronary artery disease, atrial fibrillation presenting with worsening abdominal pain for several weeks.     Patient is known to be seen at least 4-5 times in the ER over the past month.  Baseline heavy alcohol use.  Drinks at least 1 pint a day.  Also with at least 1-2 beers.  Patient states has been trying to decrease his use.  1 pack/day smoker.  Occasional marijuana use.  Paracentesis on 4/25 with 2 L removed.  Paracentesis was done after 1 dose of ceftriaxone.  Patient has dysphagia and severe protein calorie malnutrition.  Family wanted to do TPN for nutrition.  On dysphagia diet.  Unable to do a PEG tube with ascites.  Aspiration pneumonia and started on Unasyn on 05/22/2022.  5/8.  Patient started on TPN 5/9.  Patient having audible wheeze.  Will start steroids and nebulizer treatments.  Repeat chest x-ray showing multifocal pneumonia.  3 L removed from paracentesis. 5/10.  Patient still with wheeze.  Continue steroids and nebulizer treatments.  Patient more alert and eating better. 5/11.  Patient became hypotensive and hypoxic.  Then had worsening mental status and dropped heart rate.  Patient lost his pulse.  He was a DNR.  Patient was pronounced at 3:13 AM.

## 2022-05-24 NOTE — Assessment & Plan Note (Signed)
Patient does not have small bowel obstruction

## 2022-05-24 NOTE — Progress Notes (Signed)
PT Cancellation Note  Patient Details Name: MABRY KHALIL MRN: [id number] DOB: 08-Nov-1955   Cancelled Treatment:    Reason Eval/Treat Not Completed: Patient at procedure or test/unavailable Attempted to see pt this afternoon, door closed for sterile procedure (PICC placement), will try back at later time/date as appropriate.    Malachi Pro, DPT 05/24/2022, 4:16 PM

## 2022-05-24 NOTE — Assessment & Plan Note (Addendum)
Nicotine patch was given during hospital course

## 2022-05-24 NOTE — Assessment & Plan Note (Addendum)
Last sodium 147

## 2022-05-24 NOTE — Assessment & Plan Note (Addendum)
No signs of withdrawal during hospital course

## 2022-05-24 NOTE — Assessment & Plan Note (Addendum)
Patient was on Aldactone

## 2022-05-24 NOTE — Assessment & Plan Note (Addendum)
Ferritin 66, last hemoglobin 8.7

## 2022-05-24 NOTE — Assessment & Plan Note (Signed)
EKG with sinus tachycardia on admission.

## 2022-05-24 NOTE — Assessment & Plan Note (Addendum)
Paracentesis on 4/25 with 2 L removed.  Patient received 5-day course of Rocephin and now on p.o. Cipro prophylaxis.  Paracentesis on 5/9 removed 3 L of fluid.  IV albumin afterwards.  So far culture negative.  Still had 288 nucleated cell count with 67% neutrophils.

## 2022-05-25 ENCOUNTER — Inpatient Hospital Stay: Payer: 59

## 2022-05-25 DIAGNOSIS — A419 Sepsis, unspecified organism: Secondary | ICD-10-CM | POA: Diagnosis not present

## 2022-05-25 DIAGNOSIS — K652 Spontaneous bacterial peritonitis: Secondary | ICD-10-CM | POA: Diagnosis not present

## 2022-05-25 DIAGNOSIS — K7460 Unspecified cirrhosis of liver: Secondary | ICD-10-CM | POA: Diagnosis not present

## 2022-05-25 DIAGNOSIS — R188 Other ascites: Secondary | ICD-10-CM

## 2022-05-25 DIAGNOSIS — E43 Unspecified severe protein-calorie malnutrition: Secondary | ICD-10-CM | POA: Diagnosis not present

## 2022-05-25 DIAGNOSIS — Z515 Encounter for palliative care: Secondary | ICD-10-CM | POA: Diagnosis not present

## 2022-05-25 LAB — CBC
HCT: 27.6 % — ABNORMAL LOW (ref 39.0–52.0)
Hemoglobin: 8.7 g/dL — ABNORMAL LOW (ref 13.0–17.0)
MCH: 30.9 pg (ref 26.0–34.0)
MCHC: 31.5 g/dL (ref 30.0–36.0)
MCV: 97.9 fL (ref 80.0–100.0)
Platelets: 124 10*3/uL — ABNORMAL LOW (ref 150–400)
RBC: 2.82 MIL/uL — ABNORMAL LOW (ref 4.22–5.81)
RDW: 14.6 % (ref 11.5–15.5)
WBC: 15.4 10*3/uL — ABNORMAL HIGH (ref 4.0–10.5)
nRBC: 0 % (ref 0.0–0.2)

## 2022-05-25 LAB — PHOSPHORUS: Phosphorus: 2.4 mg/dL — ABNORMAL LOW (ref 2.5–4.6)

## 2022-05-25 LAB — AMMONIA: Ammonia: 21 umol/L (ref 9–35)

## 2022-05-25 LAB — COMPREHENSIVE METABOLIC PANEL
ALT: 32 U/L (ref 0–44)
AST: 70 U/L — ABNORMAL HIGH (ref 15–41)
Albumin: 2 g/dL — ABNORMAL LOW (ref 3.5–5.0)
Alkaline Phosphatase: 98 U/L (ref 38–126)
Anion gap: 10 (ref 5–15)
BUN: 24 mg/dL — ABNORMAL HIGH (ref 8–23)
CO2: 20 mmol/L — ABNORMAL LOW (ref 22–32)
Calcium: 9.4 mg/dL (ref 8.9–10.3)
Chloride: 112 mmol/L — ABNORMAL HIGH (ref 98–111)
Creatinine, Ser: 1.17 mg/dL (ref 0.61–1.24)
GFR, Estimated: >60 mL/min (ref >60)
Glucose, Bld: 127 mg/dL — ABNORMAL HIGH (ref 70–99)
Potassium: 3 mmol/L — ABNORMAL LOW (ref 3.5–5.1)
Sodium: 142 mmol/L (ref 135–145)
Total Bilirubin: 2.8 mg/dL — ABNORMAL HIGH (ref 0.3–1.2)
Total Protein: 6.6 g/dL (ref 6.5–8.1)

## 2022-05-25 LAB — BODY FLUID CELL COUNT WITH DIFFERENTIAL
Eos, Fluid: 0 %
Lymphs, Fluid: 16 %
Monocyte-Macrophage-Serous Fluid: 17 %
Neutrophil Count, Fluid: 67 %
Total Nucleated Cell Count, Fluid: 288 cu mm

## 2022-05-25 LAB — GLUCOSE, CAPILLARY
Glucose-Capillary: 106 mg/dL — ABNORMAL HIGH (ref 70–99)
Glucose-Capillary: 159 mg/dL — ABNORMAL HIGH (ref 70–99)
Glucose-Capillary: 172 mg/dL — ABNORMAL HIGH (ref 70–99)
Glucose-Capillary: 193 mg/dL — ABNORMAL HIGH (ref 70–99)
Glucose-Capillary: 199 mg/dL — ABNORMAL HIGH (ref 70–99)

## 2022-05-25 LAB — PROTEIN, PLEURAL OR PERITONEAL FLUID: Total protein, fluid: <3 g/dL

## 2022-05-25 LAB — LACTATE DEHYDROGENASE, PLEURAL OR PERITONEAL FLUID: LD, Fluid: 39 U/L — ABNORMAL HIGH (ref 3–23)

## 2022-05-25 LAB — MAGNESIUM: Magnesium: 1.9 mg/dL (ref 1.7–2.4)

## 2022-05-25 LAB — GLUCOSE, PLEURAL OR PERITONEAL FLUID: Glucose, Fluid: 195 mg/dL

## 2022-05-25 LAB — CULTURE, BLOOD (ROUTINE X 2): Culture: NO GROWTH

## 2022-05-25 MED ORDER — INSULIN ASPART 100 UNIT/ML IJ SOLN
0.0000 [IU] | Freq: Four times a day (QID) | INTRAMUSCULAR | Status: DC
  Administered 2022-05-25 (×2): 2 [IU] via SUBCUTANEOUS
  Administered 2022-05-26: 7 [IU] via SUBCUTANEOUS
  Administered 2022-05-26 (×2): 5 [IU] via SUBCUTANEOUS
  Administered 2022-05-27: 9 [IU] via SUBCUTANEOUS
  Filled 2022-05-25 (×6): qty 1

## 2022-05-25 MED ORDER — TRACE MINERALS CU-MN-SE-ZN 300-55-60-3000 MCG/ML IV SOLN
INTRAVENOUS | Status: AC
  Filled 2022-05-25: qty 732

## 2022-05-25 MED ORDER — METHYLPREDNISOLONE SODIUM SUCC 40 MG IJ SOLR
40.0000 mg | Freq: Every day | INTRAMUSCULAR | Status: DC
  Administered 2022-05-25 – 2022-05-26 (×2): 40 mg via INTRAVENOUS
  Filled 2022-05-25 (×2): qty 1

## 2022-05-25 MED ORDER — IPRATROPIUM-ALBUTEROL 0.5-2.5 (3) MG/3ML IN SOLN
3.0000 mL | Freq: Four times a day (QID) | RESPIRATORY_TRACT | Status: DC
  Administered 2022-05-25 – 2022-05-27 (×6): 3 mL via RESPIRATORY_TRACT
  Filled 2022-05-25 (×7): qty 3

## 2022-05-25 MED ORDER — FUROSEMIDE 10 MG/ML IJ SOLN
40.0000 mg | Freq: Once | INTRAMUSCULAR | Status: AC
  Administered 2022-05-25: 40 mg via INTRAVENOUS
  Filled 2022-05-25: qty 4

## 2022-05-25 MED ORDER — LIDOCAINE HCL (PF) 1 % IJ SOLN
10.0000 mL | Freq: Once | INTRAMUSCULAR | Status: AC
  Administered 2022-05-25: 10 mL via SUBCUTANEOUS
  Filled 2022-05-25: qty 10

## 2022-05-25 MED ORDER — SPIRONOLACTONE 25 MG PO TABS
25.0000 mg | ORAL_TABLET | Freq: Every day | ORAL | Status: DC
  Administered 2022-05-26: 25 mg via ORAL
  Filled 2022-05-25: qty 1

## 2022-05-25 MED ORDER — ALBUMIN HUMAN 25 % IV SOLN
12.5000 g | Freq: Once | INTRAVENOUS | Status: AC
  Administered 2022-05-25: 12.5 g via INTRAVENOUS
  Filled 2022-05-25: qty 50

## 2022-05-25 MED ORDER — POTASSIUM CHLORIDE CRYS ER 20 MEQ PO TBCR
40.0000 meq | EXTENDED_RELEASE_TABLET | Freq: Three times a day (TID) | ORAL | Status: AC
  Administered 2022-05-25 (×2): 40 meq via ORAL
  Filled 2022-05-25 (×2): qty 2

## 2022-05-25 NOTE — Progress Notes (Signed)
                                                     Palliative Care Progress Note, Assessment & Plan   Patient Name: Thomas Coffey       Date: 05/25/2022 DOB: Jun 13, 1955  Age: 67 y.o. MRN#: [id number] Attending Physician: Alford Highland, MD Primary Care Physician: Pcp, No Admit Date: 05/02/2022  Subjective: Patient is lying in bed with mitts in place.  He is sleeping but easily arousable.  He acknowledges my presence.  He makes some vocalizations but he his words are unclear.  He denies pain or discomfort by nodding.  No family or friends present at bedside during my visit.  HPI: 67 y.o. male  with past medical history of heavy alcohol use, tobacco abuse, chronic alcoholic pancreatitis, COPD, HTN, CAD, A. Fib admitted on 05/01/2022 with worsening abdominal pain for several weeks.   Noted frequent ED visits, 6 in last 6 months. Reportedly drinks about 1 pint per day with a few beers. Current tobacco use and occasional marijuana use.   Found to have ascites, cirrhosis, SBO and AKI Underwent paracentesis 4/25 2L off Repeat paracentesis 4/29 1.9L off-SBP (+)   Palliative medicine was consulted for assisting with goals of care conversations in light of worsening cachexia, chronic pancreatitis, alcoholic cirrhosis, frequent admissions and severe malnutrition.  Summary of counseling/coordination of care: I have reviewed the patient's chart and assessed the patient at bedside.  Patient appears comfortable in no apparent distress.  Plan remains for paracentesis today. TPN to continue for nutritional support.  No adjustments to medications needed at this time.  After assessing the patient, I spoke with his daughter [name] over the phone.  Medical update given.  [name] shares concern that patient is not as interactive.  I reviewed that Ativan has been held.  I  discussed that patient's mentation is baseline for my interactions this week.  He is able to respond but is not carrying on conversations.  She shares she is awaiting update after paracentesis in hopes of him being more interactive after this procedure is completed.  We discussed that patient is receiving paracentesis in hopes of giving relief of abdominal distention, though this does not reverse his poor functional status or pneumonia.  Elita Quick again confirmed that goal is to continue TPN in hopes of patient becoming stronger.  Elita Quick wants to continue current plan of care.  No adjustments to goals or medical treatment plan at this time.  PMT will continue to follow and support patient and family throughout his hospitalization.  Physical Exam Vitals reviewed.  Constitutional:      General: He is not in acute distress.    Comments: Thin, frail  HENT:     Head:     Comments: Temporal wasting Cardiovascular:     Rate and Rhythm: Normal rate.  Abdominal:     Comments: Distended, non tender  Musculoskeletal:     Comments: Generalized weakness  Skin:    General: Skin is warm and dry.  Psychiatric:        Behavior: Behavior is not agitated.             Total Time 25 minutes   Katriel Cutsforth L. Manon Hilding, FNP-BC Palliative Medicine Team Team Phone # [phone]

## 2022-05-25 NOTE — Progress Notes (Signed)
PHARMACY - TOTAL PARENTERAL NUTRITION CONSULT NOTE   Indication: Severe malnutrition, no PO intake, NPO status, intolerance to NG tube for enteric feeds   Patient Measurements: Height: 5\' 9"  (175.3 cm) Weight: 55.2 kg (121 lb 11.1 oz) IBW/kg (Calculated) : 70.7 TPN AdjBW (KG): 58.8 Body mass index is 17.97 kg/m. Usual Weight:    Assessment: medical history significant of heavy alcohol use, tobacco abuse, substance use, chronic alcoholic pancreatitis, COPD, hypertension, coronary artery disease, atrial fibrillation presenting with worsening abdominal pain for several weeks.  -patient with SBP,  antibiotic therapy completed -Hepatic encephalopathy. Ammonia improved s/p lactulose enemas. Patient pulled out multiple NG Tubes. PEG contraindicated given ascites and SBP -Aspiration Pneumonia  Glucose / Insulin: BG 106 --172 2 units SSI Electrolytes: hypokalemia Renal: SCr at apparent baseline Hepatic: AST>ALT, Total bilirubin elevated Ammonia 53 Intake / Output; MIVF: none GI Imaging: GI Surgeries / Procedures:   Central access:  5/8 TPN start date:  5/8  Nutritional Goals: Goal TPN rate is 75 mL/hr (provides 109.8 g of protein and 2097 kcals per day)  RD Assessment: Estimated Needs Total Energy Estimated Needs: 1800-2100kcal/day Total Protein Estimated Needs: 95-110g/day Total Fluid Estimated Needs: 1.5-1.7L/day  Current Nutrition:  Dysphagia 1 Diet  Plan:  ---advance TPN to 75 mL/hr at 1800  ---nutritional components: Protein -Clinisol 15%  109.8 grams Dextrose 16.8 % Lipids 63 grams   ---Electrolytes in TPN(standard): Na 18mEq/L, K 67mEq/L, Ca 52mEq/L, Mg 80mEq/L, and Phos 8mmol/L. Cl:Ac 1:1 ---po  KCL 40 meq x 2 ---Add standard MVI and trace elements to TPN Patient receiving Folic Acid IV and Thiamine IV outside of TPN currently per protocol ---adjust SSI to Sensitive q6h SSI and adjust as needed  ---Monitor TPN labs Daily until stable then Mon/Thurs  Lowella Bandy 05/25/2022,7:09 AM

## 2022-05-25 NOTE — Plan of Care (Addendum)
Patient A&Ox1, dependent for ADL and transfers, TPN infusing. Patient asks for his bedside table to be near him, but is able to grab water even with mittens and spills water all over himself and bed. This RN provided a new cup of water and offered to patient every rounding. Pure wick changed.Patient has not slept at all this shift and is continuously restless trying to get out of bed to pee. Patient has been advised numerous times he does not need to get out of bed to pee.

## 2022-05-25 NOTE — Progress Notes (Signed)
Nutrition Follow-up  DOCUMENTATION CODES:   Severe malnutrition in context of chronic illness  INTERVENTION:   TPN per pharmacy   Recommend continue daily thiamine, folic acid and MVI in TPN    Pt at high refeed risk; recommend monitor potassium, magnesium and phosphorus labs daily until stable   Daily weights    Nepro Shake po TID, each supplement provides 425 kcal and 19 grams protein  Feeding assistance with meals  NUTRITION DIAGNOSIS:   Severe Malnutrition related to chronic illness (cirrhosis) as evidenced by severe fat depletion, severe muscle depletion, percent weight loss.  Ongoing  GOAL:   Patient will meet greater than or equal to 90% of their needs  Met with TPN  MONITOR:   Diet advancement, Labs, Weight trends, Skin, I & O's, Other (Comment) (TPN)  REASON FOR ASSESSMENT:   Consult Assessment of nutrition requirement/status, Enteral/tube feeding initiation and management  ASSESSMENT:   67 y/o male with h/o etoh abuse, chronic pancreatitis, cirrhosis, gallstones,  HTN, CHF, chronic pain, Afib, CHF, portal vein thrombosis and CAD who is admitted with aspiration PNA, AKI, dysphagia, encephalopathy and pancreatitis.  4/25- s/p paracentesis- 2 L removed 4/29- s/p paracentesis- 1.9 L removed 5/5- NPO due to concern of aspiration and pocketing food, NGT placed, pt pulled out NGT, unsuccessful replacement 5/7- s/p BSE- advanced to dysphagia 1 diet with nectar thick liquids  5/8- TPN initiated  Reviewed I/O's: +160 ml x 24 hours and +5 L since 05/11/22  UOP: 425 ml x 24 hours   Per MD notes, plan for paracentesis today.   Palliative care following for ogals of care. Pt is a DNR, but family is requesting all nutritional interventions. Pt pulled out NGT on 05/21/22 and replacement was unsuccessful. Given pt's ascites and peritonitis, he would be a poor PEG candidate. MD agrees with RD that pt would also be a poor TPN candidate. Additionally, unlikely that  insurance would approve TPN for home.   RD discussed nutritional interventions with daughter. TPN was initiated on 05/24/22. Pt currently receiving TPN at 30 ml/hr, which provides 874 kcals and 45 grams protein, which meets 49% of estimated kcal needs and 47% of estimated protein needs. Plan to increase to goal rate of 75 ml/hr at 1800, which will provides 2097 kcals and 110 grams protein, meeting 100% of estimated nutritional needs.   Pt lying in bed at time of visit. He acknowledged RD and reports he ate some breakfast. Observed meal tray at bedside- pt consumed 25% of sausage. 75% of eggs, 100% of puree pineapple, and 1.75 juices (504 kcals and 12 grams protein).   Per MAR, pt is inconsistent with Nepro supplements.   Medications reviewed and include cipro, folic acid, solu-medrol, potassium chloride, thiamine, and albumin.   Labs reviewed: K: 3, CBGS: 193 (inpatient orders for glycemic control are 0-9 units insulin aspart every 6 hours).    Diet Order:   Diet Order             DIET - DYS 1 Room service appropriate? No; Fluid consistency: Nectar Thick  Diet effective now                   EDUCATION NEEDS:   Not appropriate for education at this time  Skin:  Skin Assessment: Reviewed RN Assessment  Last BM:  5/7- via rectal tube  Height:   Ht Readings from Last 1 Encounters:  05/05/2022 5\' 9"  (1.753 m)    Weight:   Wt Readings from Last  1 Encounters:  05/25/22 55.2 kg    Ideal Body Weight:  72.7 kg  BMI:  Body mass index is 17.97 kg/m.  Estimated Nutritional Needs:   Kcal:  1800-2100kcal/day  Protein:  95-110g/day  Fluid:  1.5-1.7L/day    Levada Schilling, RD, LDN, CDCES Registered Dietitian II Certified Diabetes Care and Education Specialist Please refer to [facility] for RD and/or RD on-call/weekend/after hours pager

## 2022-05-25 NOTE — Procedures (Signed)
PROCEDURE SUMMARY:  Successful ultrasound guided paracentesis from the left lower quadrant.  Yielded 3 L of amber-colored fluid.  No immediate complications.  The patient tolerated the procedure well.   Specimen sent for labs.  EBL < 2 mL  The patient has required >/=2 paracenteses in a 30 day period and a screening evaluation by the [facility] has been arranged.  Alwyn Ren, [place] [phone] 05/25/2022, 4:10 PM

## 2022-05-25 NOTE — Progress Notes (Signed)
Progress Note   Patient: Thomas Coffey ZOX:[id number] DOB: July 17, 1955 DOA: 05/10/2022     14 DOS: the patient was seen and examined on 05/25/2022   Brief hospital course: 67 y.o. male with medical history significant of heavy alcohol use, tobacco abuse, substance use, alcoholic pancreatitis, COPD, hypertension, coronary artery disease, atrial fibrillation presenting with worsening abdominal pain for several weeks.     Patient is known to be seen at least 4-5 times in the ER over the past month.  Baseline heavy alcohol use.  Drinks at least 1 pint a day.  Also with at least 1-2 beers.  Patient states has been trying to decrease his use.  1 pack/day smoker.  Occasional marijuana use.  Paracentesis on 4/25 with 2 L removed.  Paracentesis was done after 1 dose of ceftriaxone.  Patient has dysphagia and severe protein calorie malnutrition.  Family wanted to do TPN for nutrition.  On dysphagia diet.  Unable to do a PEG tube with ascites.  Aspiration pneumonia and started on Unasyn on 05/22/2022.  5/8.  Patient started on TPN 5/9.  Patient having audible wheeze.  Will start steroids and nebulizer treatments.  Repeat chest x-ray showing multifocal pneumonia.  With abdominal distention will get another paracentesis.  Assessment and Plan: * Severe sepsis (HCC) 5/7: pt now meets severe sepsis from aspiration pneumonia - tachycardia HR 110, leukocytosis (WBC 20.4), encephalopathy and AKI c/w organ dysfunction, thrombocytopenia.  Continue Unasyn.  Repeat chest x-ray on 5/9 shows multifocal pneumonia.  Started nebulizer treatments and steroids.   Spontaneous bacterial peritonitis (HCC) Paracentesis on 4/25 with 2 L removed.  Patient received 5-day course of Rocephin and now on p.o. Cipro prophylaxis.  Will get another paracentesis today.  Protein-calorie malnutrition, severe BMI 17.97 Related to chronic illnesses -- alcoholism, liver cirrhosis, chronic pancreatitis Patient is a DO NOT RESUSCITATE.   Not a candidate for PEG with ascites.  Started TPN on 5/8.  Overall prognosis is poor.  Alcoholic cirrhosis of liver with ascites (HCC) Patient with hepatic encephalopathy, elevated liver function tests, coagulopathy.  Last ammonia level 21.  Had paracentesis on 4/25 with 2 L removed.  Will get another paracentesis today.   Alcohol abuse No signs of withdrawal  Iron deficiency anemia Ferritin 66, last hemoglobin 9.3  Hypernatremia Improved on D5W.  Chronic pancreatitis (HCC) Chronic pancreatitis on imaging   SBO (small bowel obstruction) (HCC) Patient does not have small bowel obstruction  (HFpEF) heart failure with preserved ejection fraction (HCC) 2D echo 10/2018 with EF 60-65% Watch with TPN. Given 1 dose of Lasix today.  Essential hypertension Currently not on any blood pressure medication.  Given a dose of IV Lasix today.  Will have to start Aldactone.   Hypokalemia Replace with oral potassium today  Paroxysmal atrial fibrillation (HCC) EKG with sinus tachycardia on admission.  Tobacco abuse Nicotine patch  AKI (acute kidney injury) (HCC) Creatinine peaked at 2.05 on 5/3.  Last creatinine 1.17.        Subjective: Patient more alert today than yesterday.  Concentrating on eating.  Asking for water but THICKENED liquids.  Audible wheeze heard.  Initially admitted with chest pain and shortness of breath.  Physical Exam: Vitals:   05/24/22 2234 05/25/22 0433 05/25/22 0500 05/25/22 0807  BP: 136/87 129/87  112/87  Pulse: 97 (!) 106  91  Resp:  14  19  Temp:    97.7 F (36.5 C)  TempSrc:      SpO2: 92% 93%  96%  Weight:  55.2 kg   Height:       Physical Exam HENT:     Head: Normocephalic.     Mouth/Throat:     Pharynx: No oropharyngeal exudate.  Eyes:     General: Lids are normal.     Conjunctiva/sclera: Conjunctivae normal.  Cardiovascular:     Rate and Rhythm: Normal rate and regular rhythm.     Heart sounds: Normal heart sounds, S1 normal  and S2 normal.  Pulmonary:     Breath sounds: Examination of the right-middle field reveals decreased breath sounds and wheezing. Examination of the left-middle field reveals decreased breath sounds and wheezing. Examination of the right-lower field reveals decreased breath sounds and rhonchi. Examination of the left-lower field reveals decreased breath sounds and rhonchi. Decreased breath sounds, wheezing and rhonchi present. No rales.  Abdominal:     General: There is distension.     Palpations: Abdomen is soft.     Tenderness: There is no abdominal tenderness.  Musculoskeletal:     Right lower leg: No swelling.     Left lower leg: No swelling.  Skin:    General: Skin is warm.     Findings: No rash.  Neurological:     Mental Status: He is alert.     Data Reviewed: Repeat chest x-ray today shows multifocal pneumonia Potassium 3.2, creatinine 1.17, total bilirubin 2.8, AST 70, ammonia level 21, phosphorus 2.4.  Family Communication: Spoke with daughter on the phone and she was okay with getting another paracentesis.  Disposition: Status is: Inpatient Remains inpatient appropriate because: Patient started on TPN.  Will give IV steroids for multifocal pneumonia with wheeze.  Started nebulizer treatments.  Will get another paracentesis today.  Planned Discharge Destination: To be determined    Time spent: 28 minutes  Author: Alford Highland, MD 05/25/2022 12:26 PM  For on call review [email or web address].

## 2022-05-25 NOTE — Progress Notes (Signed)
PT Cancellation Note  Patient Details Name: Thomas Coffey MRN: [id number] DOB: [birth year]   Cancelled Treatment:    Reason Eval/Treat Not Completed: Patient at procedure or test/unavailable Pt off floor at [place] for paracentesis, will maintain on caseload and continue to try and see as appropriate.    Malachi Pro, DPT 05/25/2022, 3:07 PM

## 2022-05-26 DIAGNOSIS — F1010 Alcohol abuse, uncomplicated: Secondary | ICD-10-CM | POA: Diagnosis not present

## 2022-05-26 DIAGNOSIS — E43 Unspecified severe protein-calorie malnutrition: Secondary | ICD-10-CM | POA: Diagnosis not present

## 2022-05-26 DIAGNOSIS — A419 Sepsis, unspecified organism: Secondary | ICD-10-CM | POA: Diagnosis not present

## 2022-05-26 DIAGNOSIS — K652 Spontaneous bacterial peritonitis: Secondary | ICD-10-CM | POA: Diagnosis not present

## 2022-05-26 LAB — RENAL FUNCTION PANEL
Albumin: 2.1 g/dL — ABNORMAL LOW (ref 3.5–5.0)
Anion gap: 11 (ref 5–15)
BUN: 28 mg/dL — ABNORMAL HIGH (ref 8–23)
CO2: 20 mmol/L — ABNORMAL LOW (ref 22–32)
Calcium: 9.6 mg/dL (ref 8.9–10.3)
Chloride: 115 mmol/L — ABNORMAL HIGH (ref 98–111)
Creatinine, Ser: 1.31 mg/dL — ABNORMAL HIGH (ref 0.61–1.24)
GFR, Estimated: >60 mL/min (ref >60)
Glucose, Bld: 115 mg/dL — ABNORMAL HIGH (ref 70–99)
Phosphorus: 3 mg/dL (ref 2.5–4.6)
Potassium: 3.2 mmol/L — ABNORMAL LOW (ref 3.5–5.1)
Sodium: 146 mmol/L — ABNORMAL HIGH (ref 135–145)

## 2022-05-26 LAB — GLUCOSE, CAPILLARY
Glucose-Capillary: 117 mg/dL — ABNORMAL HIGH (ref 70–99)
Glucose-Capillary: 148 mg/dL — ABNORMAL HIGH (ref 70–99)
Glucose-Capillary: 270 mg/dL — ABNORMAL HIGH (ref 70–99)
Glucose-Capillary: 290 mg/dL — ABNORMAL HIGH (ref 70–99)
Glucose-Capillary: 328 mg/dL — ABNORMAL HIGH (ref 70–99)
Glucose-Capillary: 403 mg/dL — ABNORMAL HIGH (ref 70–99)

## 2022-05-26 LAB — MAGNESIUM: Magnesium: 1.9 mg/dL (ref 1.7–2.4)

## 2022-05-26 LAB — CULTURE, BLOOD (ROUTINE X 2): Special Requests: ADEQUATE

## 2022-05-26 LAB — PATHOLOGIST SMEAR REVIEW

## 2022-05-26 MED ORDER — ALBUTEROL SULFATE (2.5 MG/3ML) 0.083% IN NEBU
2.5000 mg | INHALATION_SOLUTION | RESPIRATORY_TRACT | Status: DC | PRN
  Administered 2022-05-26: 2.5 mg via RESPIRATORY_TRACT
  Filled 2022-05-26: qty 3

## 2022-05-26 MED ORDER — TRACE MINERALS CU-MN-SE-ZN 300-55-60-3000 MCG/ML IV SOLN
INTRAVENOUS | Status: DC
  Filled 2022-05-26: qty 732

## 2022-05-26 MED ORDER — SODIUM CHLORIDE 0.9 % IV SOLN
3.0000 g | Freq: Four times a day (QID) | INTRAVENOUS | Status: DC
  Administered 2022-05-26 (×3): 3 g via INTRAVENOUS
  Filled 2022-05-26 (×2): qty 8
  Filled 2022-05-26: qty 3
  Filled 2022-05-26 (×2): qty 8

## 2022-05-26 NOTE — Progress Notes (Signed)
Physical Therapy Treatment Patient Details Name: Thomas Coffey MRN: [id number] DOB: Jul 17, 1955 Today's Date: 05/26/2022   History of Present Illness Pt is a 67 y/o M admitted on 05/04/2022 after presenting with abdominal pain, ascited, cirrhosis, & AKI. PMH: heavy alcohol use, substance abuse, COPD, HTN, CAD, a-fib    PT Comments    Patient s/p paracentesis (5/9) with removal of 3L fluid.  Patient globally lethargic throughout session.  Does open eyes to voice, but requires continuous (and max) encouragement/stimulation to maintain functional alertness throughout session.  Very limited ability to follow commands.  Speech generally garbled and incoherent.  Currently requiring max/total assist +2 for all mobility; unsafe/unable to attempt mobility beyond sitting edge of bed.  Appears with significant functional decline since initial evaluation (RN informed/aware); will continue to monitor medical status, goals of care and ability to effectively participate/progress with skilled PT.    Recommendations for follow up therapy are one component of a multi-disciplinary discharge planning process, led by the attending physician.  Recommendations may be updated based on patient status, additional functional criteria and insurance authorization.  Follow Up Recommendations  Can patient physically be transported by private vehicle: No    Assistance Recommended at Discharge Frequent or constant Supervision/Assistance  Patient can return home with the following Assistance with feeding;Assist for transportation;Help with stairs or ramp for entrance;Direct supervision/assist for medications management;Direct supervision/assist for financial management;Assistance with cooking/housework;Two people to help with walking and/or transfers;Two people to help with bathing/dressing/bathroom   Equipment Recommendations       Recommendations for Other Services       Precautions / Restrictions  Precautions Precautions: Fall Restrictions Weight Bearing Restrictions: No     Mobility  Bed Mobility Overal bed mobility: Needs Assistance Bed Mobility: Supine to Sit, Sit to Supine     Supine to sit: Mod assist, Max assist Sit to supine: Max assist, Total assist   General bed mobility comments: does initiate movement of LEs for transition to edge of bed; mod assist for truncal elevation with hand-over-hand guidance    Transfers                   General transfer comment: unsafe/unable    Ambulation/Gait               General Gait Details: unsafe/unable   Stairs             Wheelchair Mobility    Modified Rankin (Stroke Patients Only)       Balance Overall balance assessment: Needs assistance Sitting-balance support: No upper extremity supported, Feet supported Sitting balance-Leahy Scale: Poor Sitting balance - Comments: requires bilat UE support, sway in all directions                                    Cognition Arousal/Alertness: Lethargic Behavior During Therapy: Flat affect Overall Cognitive Status: No family/caregiver present to determine baseline cognitive functioning                                 General Comments: Oriented to self only with max stimulation for alertness; unable to consistently follow commands or effectively communicate wants/needs        Exercises Other Exercises Other Exercises: Supine LE therex, 1x15, act assist/passive ROM: ankle pumps, heel slides, hip abduct/adduct Other Exercises: Rolling, bilat, for repositioning, linen change, dep assist +2.  Very limited ability to actively assist/participate with mobility efforts    General Comments        Pertinent Vitals/Pain Pain Assessment Pain Assessment: Faces Faces Pain Scale: Hurts little more Breathing: occasional labored breathing, short period of hyperventilation Negative Vocalization: occasional moan/groan, low  speech, negative/disapproving quality Facial Expression: smiling or inexpressive Body Language: tense, distressed pacing, fidgeting Consolability: no need to console PAINAD Score: 3 Pain Descriptors / Indicators: Moaning Pain Intervention(s): Limited activity within patient's tolerance, Monitored during session, Repositioned    Home Living                          Prior Function            PT Goals (current goals can now be found in the care plan section) Acute Rehab PT Goals Patient Stated Goal: none stated PT Goal Formulation: Patient unable to participate in goal setting Time For Goal Achievement: 06/05/22 Potential to Achieve Goals: Fair Progress towards PT goals: Progressing toward goals    Frequency    Min 2X/week      PT Plan Current plan remains appropriate    Co-evaluation              AM-PAC PT "6 Clicks" Mobility   Outcome Measure  Help needed turning from your back to your side while in a flat bed without using bedrails?: A Lot Help needed moving from lying on your back to sitting on the side of a flat bed without using bedrails?: A Lot Help needed moving to and from a bed to a chair (including a wheelchair)?: Total Help needed standing up from a chair using your arms (e.g., wheelchair or bedside chair)?: Total Help needed to walk in hospital room?: Total Help needed climbing 3-5 steps with a railing? : Total 6 Click Score: 8    End of Session Equipment Utilized During Treatment: Oxygen Activity Tolerance: Patient limited by lethargy Patient left: in bed;with call bell/phone within reach;with bed alarm set Nurse Communication: Mobility status PT Visit Diagnosis: Unsteadiness on feet (R26.81);History of falling (Z91.81);Muscle weakness (generalized) (M62.81);Difficulty in walking, not elsewhere classified (R26.2);Other abnormalities of gait and mobility (R26.89)     Time: 9563-8756 PT Time Calculation (min) (ACUTE ONLY): 23  min  Charges:  $Therapeutic Exercise: 8-22 mins $Therapeutic Activity: 8-22 mins                     Molley Houser H. Manson Passey, PT, DPT, NCS 05/26/22, 2:55 PM [phone]

## 2022-05-26 NOTE — Progress Notes (Signed)
Progress Note   Patient: Thomas Coffey VWU:[id number] DOB: Dec 12, 1955 DOA: 05/09/2022     15 DOS: the patient was seen and examined on 05/26/2022   Brief hospital course: 67 y.o. male with medical history significant of heavy alcohol use, tobacco abuse, substance use, alcoholic pancreatitis, COPD, hypertension, coronary artery disease, atrial fibrillation presenting with worsening abdominal pain for several weeks.     Patient is known to be seen at least 4-5 times in the ER over the past month.  Baseline heavy alcohol use.  Drinks at least 1 pint a day.  Also with at least 1-2 beers.  Patient states has been trying to decrease his use.  1 pack/day smoker.  Occasional marijuana use.  Paracentesis on 4/25 with 2 L removed.  Paracentesis was done after 1 dose of ceftriaxone.  Patient has dysphagia and severe protein calorie malnutrition.  Family wanted to do TPN for nutrition.  On dysphagia diet.  Unable to do a PEG tube with ascites.  Aspiration pneumonia and started on Unasyn on 05/22/2022.  5/8.  Patient started on TPN 5/9.  Patient having audible wheeze.  Will start steroids and nebulizer treatments.  Repeat chest x-ray showing multifocal pneumonia.  3 L removed from paracentesis. 5/10.  Patient still with wheeze.  Continue steroids and nebulizer treatments.  Patient more alert and eating better.   Assessment and Plan: * Severe sepsis (HCC) 5/7: pt now meets severe sepsis from aspiration pneumonia - tachycardia HR 110, leukocytosis (WBC 20.4), encephalopathy and AKI c/w organ dysfunction, thrombocytopenia.  Continue Unasyn.  Repeat chest x-ray on 5/9 shows multifocal pneumonia.  Continue nebulizer treatments and steroids.   Spontaneous bacterial peritonitis (HCC) Paracentesis on 4/25 with 2 L removed.  Patient received 5-day course of Rocephin and now on p.o. Cipro prophylaxis.  Paracentesis on 5/9 removed 3 L of fluid.  IV albumin afterwards.  So far culture negative.  Still at 288  nucleated cell count with 67% neutrophils.  Protein-calorie malnutrition, severe BMI 17.78 Related to chronic illnesses -- alcoholism, liver cirrhosis, chronic pancreatitis Patient is a DO NOT RESUSCITATE.  Not a candidate for PEG with ascites.  Started TPN on 5/8.  Overall prognosis is poor.  Alcoholic cirrhosis of liver with ascites (HCC) Patient with hepatic encephalopathy, elevated liver function tests, coagulopathy.  Last ammonia level 21.  Had paracentesis on 4/25 with 2 L removed, another paracentesis on 5/9 with 3 L removed.  Will start low-dose Aldactone.  Likely will start Lasix tomorrow.   Alcohol abuse No signs of withdrawal  Iron deficiency anemia Ferritin 66, last hemoglobin 8.7  Hypernatremia Na 146 today  Chronic pancreatitis (HCC) Chronic pancreatitis on imaging   SBO (small bowel obstruction) (HCC) Patient does not have small bowel obstruction  (HFpEF) heart failure with preserved ejection fraction (HCC) 2D echo 10/2018 with EF 60-65% Watch with TPN.   Essential hypertension Currently not on any blood pressure medication. Continue Aldactone.   Hypokalemia Starting Aldactone which should help out with potassium  Paroxysmal atrial fibrillation (HCC) EKG with sinus tachycardia on admission.  Tobacco abuse Nicotine patch  AKI (acute kidney injury) (HCC) Creatinine peaked at 2.05 on 5/3.  Last creatinine 1.31.        Subjective: Patient more awake.  Again asking for water.  Still with coughing.  3 L removed from abdomen yesterday.  Admitted 16 days ago with ascites  Physical Exam: Vitals:   05/26/22 0007 05/26/22 0500 05/26/22 0832 05/26/22 1502  BP: 103/79  127/80 137/76  Pulse:  87  82 88  Resp: 18  17 16   Temp: 98 F (36.7 C)  98 F (36.7 C) 98.5 F (36.9 C)  TempSrc:      SpO2: 100%  100% 100%  Weight:  54.6 kg    Height:       Physical Exam HENT:     Head: Normocephalic.     Mouth/Throat:     Pharynx: No oropharyngeal exudate.   Eyes:     General: Lids are normal.     Conjunctiva/sclera: Conjunctivae normal.  Cardiovascular:     Rate and Rhythm: Normal rate and regular rhythm.     Heart sounds: Normal heart sounds, S1 normal and S2 normal.  Pulmonary:     Breath sounds: Examination of the right-middle field reveals decreased breath sounds and wheezing. Examination of the left-middle field reveals decreased breath sounds and wheezing. Examination of the right-lower field reveals decreased breath sounds and rhonchi. Examination of the left-lower field reveals decreased breath sounds and rhonchi. Decreased breath sounds, wheezing and rhonchi present. No rales.  Abdominal:     General: There is distension.     Palpations: Abdomen is soft.     Tenderness: There is no abdominal tenderness.  Musculoskeletal:     Right lower leg: No swelling.     Left lower leg: No swelling.  Skin:    General: Skin is warm.     Findings: No rash.  Neurological:     Mental Status: He is alert.     Data Reviewed: Sodium 146, potassium 3.2, BUN 28, creatinine 1.31, albumin 2.1 Body fluid total nucleated cell count 288 with 67% neutrophils.  No organisms seen on Gram stain and cultures so far.  Family Communication: Spoke with daughter at the bedside  Disposition: Status is: Inpatient Remains inpatient appropriate because: On TPN, will have a difficult time finding placement.  Planned Discharge Destination: Rehab    Time spent: 28 minutes  Author: Alford Highland, MD 05/26/2022 5:39 PM  For on call review [email or web address].

## 2022-05-26 NOTE — Progress Notes (Signed)
At bedside for PICC assessment. Per RN, pt confused and was pulling at site. Dressing intact, however, dried sanguinous drainage noted under transparent dressing. Upon assessment, PICC appears to be at the 0 mark. Recommended a dressing change, and to note the exit measurement, if out more than 2-3 cm, a CXR may be warranted. RN in agreement.

## 2022-05-26 NOTE — Plan of Care (Signed)
Patient A&Ox1, from home, needs assistance with all ADLs in room. TPN maintained, pure wick changed, minimal urinary output this shift. Patient lethargic tonight.

## 2022-05-26 NOTE — Progress Notes (Signed)
At bedside for IVT consult regarding PICC line with small amount of bleeding noted. No active bleeding noted. RN reported changing dressing with placement at 0 cm, Also reports good blood return and flushes easily. Currently both ports infusing without issue. Recommended not changing dressing again to allow for clotting. If bleeding becomes active, re consult IVT. RN agreeable.

## 2022-05-26 NOTE — Progress Notes (Deleted)
New patient visit   Patient: Thomas Coffey   DOB: [birth year]   67 y.o. Male  MRN: [id number] Visit Date: 05/29/2022  Today's healthcare provider: Ronnald Ramp, MD   No chief complaint on file.  Subjective    Thomas Coffey is a 67 y.o. male who presents today as a new patient to establish care.  HPI   Encounter to Establish Care Patient presents to establish care  Introduced myself and my role as primary care physician  We reviewed patient's medical, surgical, and social history and medications as listed below    PMHX   Last annual physical: ***   HTN Medications: ***   ***  Medications: ***    Social Hx  Tobacco use: *** Alcohol Use : *** Illicit drug use: ***  ***    Concerns for Today:   ***:   Past Medical History:  Diagnosis Date   Acute pancreatitis    Alcohol use    CHF (congestive heart failure) (HCC)    COPD (chronic obstructive pulmonary disease) (HCC)    Coronary artery disease    COVID-19 virus infection    positive COVID-19 test on 01/22/20   Hypertension    MI (myocardial infarction) (HCC)    Paroxysmal atrial fibrillation (HCC)    Past Surgical History:  Procedure Laterality Date   ANTERIOR VITRECTOMY Right 10/04/2020   Procedure: ANTERIOR VITRECTOMY;  Surgeon: Nevada Crane, MD;  Location: [facility];  Service: Ophthalmology;  Laterality: Right;   CATARACT EXTRACTION W/PHACO Right 10/04/2020   Procedure: CATARACT EXTRACTION PHACO AND INTRAOCULAR LENS PLACEMENT (IOC) RIGHT VISION BLUE, CAPSULAR TENSION RING 14A  RIGHT 21.84 01:44.7;  Surgeon: Nevada Crane, MD;  Location: [facility];  Service: Ophthalmology;  Laterality: Right;   CATARACT EXTRACTION W/PHACO Left 10/18/2020   Procedure: CATARACT EXTRACTION PHACO AND INTRAOCULAR LENS PLACEMENT (IOC) LEFT 2.95 00:23.9;  Surgeon: Nevada Crane, MD;  Location: [facility];  Service: Ophthalmology;  Laterality: Left;   PARTIAL COLECTOMY      Family Status  Relation Name Status   Mother  Deceased   Father  Deceased   Brother  Deceased   Family History  Problem Relation Age of Onset   Cancer Mother    Kidney disease Brother    Social History   Socioeconomic History   Marital status: Divorced    Spouse name: Not on file   Number of children: Not on file   Years of education: Not on file   Highest education level: Not on file  Occupational History   Not on file  Tobacco Use   Smoking status: Every Day    Packs/day: 1.00    Years: 30.00    Additional pack years: 0.00    Total pack years: 30.00    Types: Cigarettes   Smokeless tobacco: Never  Substance and Sexual Activity   Alcohol use: Yes    Alcohol/week: 6.0 standard drinks of alcohol    Types: 6 Cans of beer per week    Comment: daily   Drug use: Yes    Types: Marijuana   Sexual activity: Not on file  Other Topics Concern   Not on file  Social History Narrative   Not on file   Social Determinants of Health   Financial Resource Strain: Not on file  Food Insecurity: No Food Insecurity (04/18/2022)   Hunger Vital Sign    Worried About Running Out of Food in the Last Year: Never true  Ran Out of Food in the Last Year: Never true  Transportation Needs: No Transportation Needs (04/20/2022)   PRAPARE - Administrator, Civil Service (Medical): No    Lack of Transportation (Non-Medical): No  Physical Activity: Not on file  Stress: Not on file  Social Connections: Not on file   Facility-Administered Medications Prior to Visit  Medication Dose Route Frequency Provider   acetaminophen (TYLENOL) tablet 650 mg  650 mg Oral TID PRN Darlin Priestly, MD   albuterol (PROVENTIL) (2.5 MG/3ML) 0.083% nebulizer solution 2.5 mg  2.5 mg Nebulization Q2H PRN Wieting, Richard, MD   Ampicillin-Sulbactam (UNASYN) 3 g in sodium chloride 0.9 % 100 mL IVPB  3 g Intravenous Q6H Wieting, Richard, MD   Chlorhexidine Gluconate Cloth 2 % PADS 6 each  6 each Topical  Daily Wieting, Richard, MD   ciprofloxacin (CIPRO) tablet 500 mg  500 mg Oral Q breakfast Esaw Grandchild A, DO   dextromethorphan-guaiFENesin (MUCINEX DM) 30-600 MG per 12 hr tablet 1 tablet  1 tablet Oral BID Manuela Schwartz, NP   dronabinol (MARINOL) capsule 2.5 mg  2.5 mg Oral BID AC Griffith, Kelly A, DO   enoxaparin (LOVENOX) injection 40 mg  40 mg Subcutaneous Q24H Floydene Flock, MD   feeding supplement (NEPRO CARB STEADY) liquid 237 mL  237 mL Oral TID BM Esaw Grandchild A, DO   folic acid (FOLVITE) tablet 1 mg  1 mg Oral Daily [name], RPH   Or   folic acid injection 1 mg  1 mg Intravenous Daily [name], RPH   hydrocerin (EUCERIN) cream   Topical BID Floydene Flock, MD   insulin aspart (novoLOG) injection 0-9 Units  0-9 Units Subcutaneous Q6H Lowella Bandy, RPH   ipratropium-albuterol (DUONEB) 0.5-2.5 (3) MG/3ML nebulizer solution 3 mL  3 mL Nebulization Q6H Wieting, Richard, MD   lidocaine (LIDODERM) 5 % 1 patch  1 patch Transdermal Q24H Foust, Katy L, NP   methocarbamol (ROBAXIN) tablet 750 mg  750 mg Oral Q8H PRN Esaw Grandchild A, DO   methylPREDNISolone sodium succinate (SOLU-MEDROL) 40 mg/mL injection 40 mg  40 mg Intravenous Daily Wieting, Richard, MD   morphine (PF) 2 MG/ML injection 1 mg  1 mg Intravenous Q4H PRN Theotis Burrow, NP   nicotine (NICODERM CQ - dosed in mg/24 hours) patch 21 mg  21 mg Transdermal Daily Floydene Flock, MD   ondansetron [facility]) tablet 4 mg  4 mg Oral Q6H PRN Floydene Flock, MD   Or   ondansetron [facility]) injection 4 mg  4 mg Intravenous Q6H PRN Floydene Flock, MD   Oral care mouth rinse  15 mL Mouth Rinse PRN Esaw Grandchild A, DO   oxyCODONE (Oxy IR/ROXICODONE) immediate release tablet 5-10 mg  5-10 mg Oral Q6H PRN Esaw Grandchild A, DO   pantoprazole (PROTONIX) EC tablet 40 mg  40 mg Oral BID Chappell, Alex B, RPH   sodium chloride flush (NS) 0.9 % injection 10-40 mL  10-40 mL Intracatheter Q12H Wieting,  Richard, MD   sodium chloride flush (NS) 0.9 % injection 10-40 mL  10-40 mL Intracatheter PRN Wieting, Richard, MD   spironolactone (ALDACTONE) tablet 25 mg  25 mg Oral Daily Wieting, Richard, MD   thiamine (VITAMIN B1) tablet 100 mg  100 mg Oral Daily Floydene Flock, MD   Or   thiamine (VITAMIN B1) injection 100 mg  100 mg Intravenous Daily Floydene Flock, MD   TPN ADULT (  ION)   Intravenous Continuous TPN Celene Squibb, [facility]   Outpatient Medications Prior to Visit  Medication Sig   furosemide (LASIX) 40 MG tablet Take 1 tablet (40 mg total) by mouth daily.   ondansetron (ZOFRAN-ODT) 4 MG disintegrating tablet Take 1 tablet (4 mg total) by mouth every 8 (eight) hours as needed for nausea or vomiting.   Potassium Chloride ER 20 MEQ TBCR Take 1 tablet by mouth daily. (Patient not taking: Reported on 05/13/2022)   potassium chloride SA (KLOR-CON M) 20 MEQ tablet Take 1 tablet (20 mEq total) by mouth daily.   spironolactone (ALDACTONE) 25 MG tablet Take 0.5 tablets (12.5 mg total) by mouth daily.   No Known Allergies   There is no immunization history on file for this patient.  Health Maintenance  Topic Date Due   COVID-19 Vaccine (1) Never done   Pneumonia Vaccine 34+ Years old (1 of 2 - PCV) Never done   DTaP/Tdap/Td (1 - Tdap) Never done   COLONOSCOPY (Pts 45-56yrs Insurance coverage will need to be confirmed)  Never done   Zoster Vaccines- Shingrix (1 of 2) Never done   INFLUENZA VACCINE  08/17/2022   Hepatitis C Screening  Completed   HPV VACCINES  Aged Out   Lung Cancer Screening  Discontinued    Patient Care Team: Pcp, No as PCP - General  Review of Systems  {Labs  Heme  Chem  Endocrine  Serology  Results Review (optional):23779}   Objective    There were no vitals taken for this visit. {Show previous vital signs (optional):23777}  Physical Exam ***  Depression Screen     No data to display         No results found for any visits on 05/29/22.   Assessment & Plan     ***  No follow-ups on file.     {provider attestation***:1}   Ronnald Ramp, MD  [facility] [phone] (phone) [phone] (fax)  [facility]

## 2022-05-26 NOTE — Progress Notes (Signed)
PHARMACY - TOTAL PARENTERAL NUTRITION CONSULT NOTE   Indication: Severe malnutrition, no PO intake, NPO status, intolerance to NG tube for enteric feeds   Patient Measurements: Height: 5\' 9"  (175.3 cm) Weight: 54.6 kg (120 lb 5.9 oz) IBW/kg (Calculated) : 70.7 TPN AdjBW (KG): 58.8 Body mass index is 17.78 kg/m. Usual Weight:    Assessment:  Thomas Coffey is a 67 y.o. male presenting with abdominal pain. PMH significant for AUD, SUD, CHF, COPD, HTN, CAD (MI), AF, pancreatitis. Patient completed 5-day course of ceftriaxone for SBP and is now on ciprofloxacin for prophylaxis. Patient unable to maintain NG tube given hepatic encephalopathy, and PEG contraindicated given ascites and SBP. Pharmacy has been consulted to manage TPN.  Glucose / Insulin: 24h BG- 110-190s, 24h insulin- 11u Electrolytes: K 3.2, Na 146, Cl 115, CO2 20 Renal: SCr 1.31 (BL 1-1.1) Hepatic: AST>ALT, Tbili 2.8, Ammonia 21 Intake / Output: +3.6 L MIVF: none GI Imaging:  4/24 CT Abd: cirrhosis, moderate diffuse abd ascites, possible SBO vs enteritis, chronic pancreatitis with several cystic lesions GI Surgeries / Procedures:  4/25 Paracentesis: removed 2L dark amber fluid 4/29 Paracentesis: removed 1.9L of straw-colored fluid 5/9 Paracentesis: removed 2L amber-colored fluid   Central access: 5/8 TPN start date: 5/8  Nutritional Goals: Goal TPN rate is 75 mL/hr (provides 109.8 g of protein and 2097 kcals per day)  RD Assessment: Estimated Needs Total Energy Estimated Needs: 1800-2100kcal/day Total Protein Estimated Needs: 95-110g/day Total Fluid Estimated Needs: 1.5-1.7L/day  Current Nutrition: Dysphagia 1 Diet, TPN  Plan:  Continue TPN at 75 mL/hr at 1800  utritional components: Protein -Clinisol 15%  109.8 grams Dextrose 16.8 % Lipids 63 grams   Electrolytes in TPN(standard): Na 32mEq/L, K 109mEq/L, Ca 64mEq/L, Mg 75mEq/L, and Phos 11mmol/L. Cl:Ac 1:2 Add standard MVI and trace elements to TPN Patient  receiving folic acid IV and thiamine IV outside of TPN Continue  Sensitive SSI q6h and adjust as needed  Monitor TPN labs daily until stable then Mon/Thurs  Celene Squibb, PharmD PGY1 Pharmacy Resident 05/26/2022 9:07 AM

## 2022-05-27 LAB — COMPREHENSIVE METABOLIC PANEL
ALT: 27 U/L (ref 0–44)
AST: 67 U/L — ABNORMAL HIGH (ref 15–41)
Albumin: <1.5 g/dL — ABNORMAL LOW (ref 3.5–5.0)
Alkaline Phosphatase: 68 U/L (ref 38–126)
Anion gap: 8 (ref 5–15)
BUN: 32 mg/dL — ABNORMAL HIGH (ref 8–23)
CO2: 16 mmol/L — ABNORMAL LOW (ref 22–32)
Calcium: 8.2 mg/dL — ABNORMAL LOW (ref 8.9–10.3)
Chloride: 123 mmol/L — ABNORMAL HIGH (ref 98–111)
Creatinine, Ser: 1.07 mg/dL (ref 0.61–1.24)
GFR, Estimated: >60 mL/min (ref >60)
Glucose, Bld: 496 mg/dL — ABNORMAL HIGH (ref 70–99)
Potassium: 4.3 mmol/L (ref 3.5–5.1)
Sodium: 147 mmol/L — ABNORMAL HIGH (ref 135–145)
Total Bilirubin: 1.7 mg/dL — ABNORMAL HIGH (ref 0.3–1.2)
Total Protein: 4.5 g/dL — ABNORMAL LOW (ref 6.5–8.1)

## 2022-05-27 LAB — GLUCOSE, CAPILLARY: Glucose-Capillary: 406 mg/dL — ABNORMAL HIGH (ref 70–99)

## 2022-05-27 LAB — CULTURE, BLOOD (ROUTINE X 2)

## 2022-05-27 LAB — MAGNESIUM: Magnesium: 1.9 mg/dL (ref 1.7–2.4)

## 2022-05-27 LAB — BODY FLUID CULTURE W GRAM STAIN: Culture: NO GROWTH

## 2022-05-27 LAB — PHOSPHORUS: Phosphorus: 6.6 mg/dL — ABNORMAL HIGH (ref 2.5–4.6)

## 2022-05-27 MED ORDER — INSULIN ASPART 100 UNIT/ML IJ SOLN: 10.0000 [IU] | Freq: Once | INTRAMUSCULAR | Status: DC

## 2022-05-27 MED ORDER — SODIUM CHLORIDE 0.9 % IV BOLUS: 1000.0000 mL | Freq: Once | INTRAVENOUS | Status: DC

## 2022-05-28 LAB — BODY FLUID CULTURE W GRAM STAIN

## 2022-05-28 LAB — CULTURE, BLOOD (ROUTINE X 2)
Culture: NO GROWTH
Special Requests: ADEQUATE

## 2022-05-29 ENCOUNTER — Ambulatory Visit: Payer: 59 | Admitting: Family Medicine

## 2022-06-17 NOTE — Progress Notes (Signed)
       CROSS COVER NOTE  NAME: Thomas Coffey MRN: [id number] DOB : November 11, 1955    HPI/Events of Note   Report:message received from nurse  BP is 86/56 MAP 67, pulse 70 just now. 88% 4L O2. I did try suctioning pt and was not able to get much. This BP is down from 95/63 with MAP 74, and pulse 78. I know he is DNR, but wanted to make you aware and see if you had any recommendations. Thank you!  Blood sugar noted to be elevated earlier was rechecked and no improvement after insulin, 406 from 403 Received page then that patient with agonal breathing and low heart rate On review of chart: Patient with decompensating cirrhosis and severe malnutrition recently started on TPN. S/P 2 paracentesis removing of total of 5 liters ascites. Recently started on TPN for nutritional support.  Code status noted to be DNR    Assessment and  Interventions   Assessment: Bedside no purposeful response., pulse palpable 25. See flowsheet for vitals. Liter fluid bolus started. Family was contacted via phone regarding change. Labs ordered  Patient heart rate continued to deteriorate. Patient lost pulse, no breathing effort and response to pain. Time of death 0313 AM Jun 15, 2022. Daughter informed at bedside       Donnie Mesa NP [facility] Cross Cover 7pm-7am - check amion for availability Pager [phone]

## 2022-06-17 NOTE — Progress Notes (Signed)
Pt BP 86/56 with MAP 67 and pulse 70, 88% on 4L O2. Patient does have audible wheezing/wetness/gurgling. This RN attempted to suction patient with little success. NP notified, waiting on any recommendations.  NP recommended to check BG-406

## 2022-06-17 NOTE — Death Summary Note (Signed)
DEATH SUMMARY   Patient Details  Name: Thomas Coffey MRN: [id number] DOB: 04-04-1955 PCP:Pcp, No Admission/Discharge Information   Admit Date:  2022-05-28  Date of Death: Date of Death: 06/14/2022  Time of Death: Time of Death: 0313  Length of Stay: June 19, 2022   Principle Cause of death: Severe sepsis, aspiration pneumonia, failure to thrive, severe protein calorie malnutrition  Hospital Diagnoses: Principal Problem:   Severe sepsis (HCC) Active Problems:   Spontaneous bacterial peritonitis (HCC)   Protein-calorie malnutrition, severe   Alcoholic cirrhosis of liver with ascites (HCC)   Alcohol abuse   AKI (acute kidney injury) (HCC)   Tobacco abuse   Paroxysmal atrial fibrillation (HCC)   Hypokalemia   Aspiration pneumonia (HCC)   Essential hypertension   (HFpEF) heart failure with preserved ejection fraction (HCC)   SBO (small bowel obstruction) (HCC)   Chronic pancreatitis (HCC)   Goals of care, counseling/discussion   Palliative care by specialist   Hypernatremia   Iron deficiency anemia   Hospital Course: 67 y.o. male with medical history significant of heavy alcohol use, tobacco abuse, substance use, alcoholic pancreatitis, COPD, hypertension, coronary artery disease, atrial fibrillation presenting with worsening abdominal pain for several weeks.     Patient is known to be seen at least 4-5 times in the ER over the past month.  Baseline heavy alcohol use.  Drinks at least 1 pint a day.  Also with at least 1-2 beers.  Patient states has been trying to decrease his use.  1 pack/day smoker.  Occasional marijuana use.  Paracentesis on 4/25 with 2 L removed.  Paracentesis was done after 1 dose of ceftriaxone.  Patient has dysphagia and severe protein calorie malnutrition.  Family wanted to do TPN for nutrition.  On dysphagia diet.  Unable to do a PEG tube with ascites.  Aspiration pneumonia and started on Unasyn on 05/22/2022.  5/8.  Patient started on TPN 5/9.  Patient  having audible wheeze.  Will start steroids and nebulizer treatments.  Repeat chest x-ray showing multifocal pneumonia.  3 L removed from paracentesis. 5/10.  Patient still with wheeze.  Continue steroids and nebulizer treatments.  Patient more alert and eating better. 06-14-22.  Patient became hypotensive and hypoxic.  Then had worsening mental status and dropped heart rate.  Patient lost his pulse.  He was a DNR.  Patient was pronounced at 3:13 AM.   Assessment and Plan: * Severe sepsis (HCC) 5/7: pt now meets severe sepsis from aspiration pneumonia - tachycardia HR 110, leukocytosis (WBC 20.4), encephalopathy and AKI c/w organ dysfunction, thrombocytopenia.  Repeat chest x-ray on 5/9 shows multifocal pneumonia.  Patient was treated with Unasyn, steroids and nebulizer treatments   Spontaneous bacterial peritonitis (HCC) Paracentesis on 4/25 with 2 L removed.  Patient received 5-day course of Rocephin and now on p.o. Cipro prophylaxis.  Paracentesis on 5/9 removed 3 L of fluid.  IV albumin afterwards.  So far culture negative.  Still had 288 nucleated cell count with 67% neutrophils.  Protein-calorie malnutrition, severe BMI 17.78, underweight, failure to thrive Related to chronic illnesses -- alcoholism, liver cirrhosis, chronic pancreatitis Patient was started on TPN on 5/8.  Last albumin less than 1.5  Alcoholic cirrhosis of liver with ascites (HCC) Patient with hepatic encephalopathy, elevated liver function tests, coagulopathy.  Last ammonia level 21.  Had paracentesis on 4/25 with 2 L removed, another paracentesis on 5/9 with 3 L removed.  Was on low-dose Aldactone   Alcohol abuse No signs of withdrawal during  hospital course  Iron deficiency anemia Ferritin 66, last hemoglobin 8.7  Hypernatremia Last sodium 147  Chronic pancreatitis (HCC) Chronic pancreatitis on imaging   SBO (small bowel obstruction) (HCC) Patient does not have small bowel obstruction  (HFpEF) heart failure  with preserved ejection fraction (HCC) 2D echo 10/2018 with EF 60-65% Watch with TPN.   Essential hypertension Patient was on Aldactone  Hypokalemia Aldactone helped out with potassium.  Last potassium 4.3  Paroxysmal atrial fibrillation (HCC) EKG with sinus tachycardia on admission.  Tobacco abuse Nicotine patch was given during hospital course  AKI (acute kidney injury) (HCC) Creatinine peaked at 2.05 on 5/3.  Last creatinine 1.07         Procedures: 2 paracentesis  Consultations: Interventional radiology, general surgery, palliative care  The results of significant diagnostics from this hospitalization (including imaging, microbiology, ancillary and laboratory) are listed below for reference.   Significant Diagnostic Studies: US Paracentesis  Result Date: 05/25/2022 INDICATION: Patient with a history of cirrhosis with recurrent ascites. Interventional radiology asked to perform a diagnostic and therapeutic paracentesis. EXAM: ULTRASOUND GUIDED PARACENTESIS MEDICATIONS: 1% lidocaine 10 mL COMPLICATIONS: None immediate. PROCEDURE: Informed written consent was obtained from the patient after a discussion of the risks, benefits and alternatives to treatment. A timeout was performed prior to the initiation of the procedure. Initial ultrasound scanning demonstrates a large amount of ascites within the left lower abdominal quadrant. The left lower abdomen was prepped and draped in the usual sterile fashion. 1% lidocaine was used for local anesthesia. Following this, a 19 gauge, 7-cm, Yueh catheter was introduced. An ultrasound image was saved for documentation purposes. The paracentesis was performed. The catheter was removed and a dressing was applied. The patient tolerated the procedure well without immediate post procedural complication. FINDINGS: A total of approximately 3 L of amber colored fluid was removed. Samples were sent to the laboratory as requested by the clinical team.  IMPRESSION: Successful ultrasound-guided paracentesis yielding 3 liters of peritoneal fluid. Performed by Alwyn Ren NP PLAN: The patient has required >/=2 paracenteses in a 30 day period and a formal evaluation by the [facility] Interventional Radiology Portal Hypertension Clinic has been arranged. Electronically Signed   By: Olive Bass M.D.   On: 05/25/2022 16:11   DG Chest Port 1 View  Result Date: 05/25/2022 CLINICAL DATA:  Shortness of breath EXAM: PORTABLE CHEST 1 VIEW COMPARISON:  CXR 05/22/22 FINDINGS: No pleural effusion. No pneumothorax. Normal cardiac and mediastinal contours. Low lung volumes. Compared to prior exam there is a new patchy airspace opacity in the right upper lung with unchanged airspace opacity in the left upper lung. No radiographically apparent displaced rib fractures. Visualized upper abdomen is unremarkable. IMPRESSION: New patchy airspace opacity in the right upper lung with unchanged airspace opacity in the left upper lung. Findings are concerning for multifocal pneumonia. Electronically Signed   By: Lorenza Cambridge M.D.   On: 05/25/2022 09:04   [place] EKG SITE RITE  Result Date: 05/23/2022 If Site Rite image not attached, placement could not be confirmed due to current cardiac rhythm.  DG Chest 1 View  Result Date: 05/22/2022 CLINICAL DATA:  Hypoxia and chest pain EXAM: CHEST  1 VIEW COMPARISON:  Chest x-ray 05/14/2022 FINDINGS: There is new patchy multifocal airspace disease in the left mid and lower lung. Costophrenic angles are clear. No pneumothorax visualized. Questionable small nodular density projects over the left costophrenic angle. The cardiomediastinal silhouette is within normal limits. There are atherosclerotic calcifications of the aorta. No acute  fractures are seen. IMPRESSION: 1. New patchy multifocal airspace disease in the left mid and lower lung concerning for pneumonia. Followup PA and lateral chest X-ray is recommended in 3-4 weeks following trial of  antibiotic therapy to ensure resolution. 2. Questionable small nodular density in the left costophrenic angle. Recommend attention to this area on follow-up x-ray. Electronically Signed   By: Darliss Cheney M.D.   On: 05/22/2022 15:02   DG Lumbar Spine 2-3 Views  Result Date: 05/18/2022 CLINICAL DATA:  Back pain EXAM: LUMBAR SPINE - 3 VIEW COMPARISON:  Lumbar spine radiograph dated 05/30/2020 FINDINGS: There is no evidence of lumbar spine fracture. Straightening of the lumbar lordosis. Multilevel degenerative changes of the lumbar spine characterized by anterior disc osteophytes and mild intervertebral disc space narrowing, similar to 05/30/2020. Surgical clip projects over the medial right hemiabdomen. IMPRESSION: 1. No acute fracture. 2. Multilevel degenerative changes of the lumbar spine, similar to 05/30/2020. Electronically Signed   By: Agustin Cree M.D.   On: 05/18/2022 08:39   US Paracentesis  Result Date: 05/15/2022 INDICATION: Patient history of alcohol abuse, cirrhosis. Found to have ascites. Request is for therapeutic and diagnostic paracentesis EXAM: ULTRASOUND GUIDED THERAPEUTIC AND DIAGNOSTIC PARACENTESIS MEDICATIONS: Lidocaine 1% 10 mL COMPLICATIONS: None immediate. PROCEDURE: Informed written consent was obtained from the patient after a discussion of the risks, benefits and alternatives to treatment. A timeout was performed prior to the initiation of the procedure. Initial ultrasound scanning demonstrates a small amount of ascites within the left lower abdominal quadrant. The left lower abdomen was prepped and draped in the usual sterile fashion. 1% lidocaine was used for local anesthesia. Following this, a 19 gauge, 7-cm, Yueh catheter was introduced. An ultrasound image was saved for documentation purposes. The paracentesis was performed. The catheter was removed and a dressing was applied. The patient tolerated the procedure well without immediate post procedural complication. FINDINGS: A  total of approximately 1.9 L of straw-colored fluid was removed. Samples were sent to the laboratory as requested by the clinical team. IMPRESSION: Successful ultrasound-guided therapeutic and diagnostic paracentesis yielding 1.9 liters of peritoneal fluid. Read by: Anders Grant, NP PLAN: If the patient eventually requires >/=2 paracenteses in a 30 day period, candidacy for formal evaluation by the [facility] Interventional Radiology Portal Hypertension Clinic will be assessed. Roanna Banning, MD [facility] Radiology Electronically Signed   By: Roanna Banning M.D.   On: 05/15/2022 15:20   US Paracentesis  Result Date: 05/11/2022 INDICATION: Patient with new onset ascites request received for diagnostic and therapeutic paracentesis. EXAM: ULTRASOUND GUIDED  PARACENTESIS MEDICATIONS: Local 1% lidocaine only. COMPLICATIONS: None immediate. PROCEDURE: Informed written consent was obtained from the patient after a discussion of the risks, benefits and alternatives to treatment. A timeout was performed prior to the initiation of the procedure. Initial ultrasound scanning demonstrates a small amount of ascites within the left lower abdominal quadrant. The left lower abdomen was prepped and draped in the usual sterile fashion. 1% lidocaine was used for local anesthesia. Following this, a 19 gauge, 7-cm, Yueh catheter was introduced. An ultrasound image was saved for documentation purposes. The paracentesis was performed. The catheter was removed and a dressing was applied. The patient tolerated the procedure well without immediate post procedural complication. FINDINGS: A total of approximately 2 L of dark amber colored fluid was removed. Samples were sent to the laboratory as requested by the clinical team. IMPRESSION: Successful ultrasound-guided paracentesis yielding 2 liters of peritoneal fluid. This exam was performed  by Pattricia Boss PA-C, and was supervised and  interpreted by Dr. Loreta Ave. Electronically Signed   By: Gilmer Mor D.O.   On: 05/11/2022 16:12   CT Angio Chest PE W and/or Wo Contrast  Result Date: 04/29/2022 CLINICAL DATA:  Abdominal pain, acute. Chest pain and shortness of breath. EXAM: CT ANGIOGRAPHY CHEST CT ABDOMEN AND PELVIS WITH CONTRAST TECHNIQUE: Multidetector CT imaging of the chest was performed using the standard protocol during bolus administration of intravenous contrast. Multiplanar CT image reconstructions and MIPs were obtained to evaluate the vascular anatomy. Multidetector CT imaging of the abdomen and pelvis was performed using the standard protocol during bolus administration of intravenous contrast. RADIATION DOSE REDUCTION: This exam was performed according to the departmental dose-optimization program which includes automated exposure control, adjustment of the mA and/or kV according to patient size and/or use of iterative reconstruction technique. CONTRAST:  OMNIPAQUE IOHEXOL 350 MG/ML SOLN COMPARISON:  CT AP 04/19/2022 an CTA chest 04/11/2022 FINDINGS: CTA CHEST FINDINGS Cardiovascular: Satisfactory opacification of the pulmonary arteries to the segmental level. No evidence of pulmonary embolism. Normal heart size. No pericardial effusion. Aortic atherosclerosis and coronary artery calcifications. Mediastinum/Nodes: No enlarged mediastinal, hilar, or axillary lymph nodes. Thyroid gland, trachea, and esophagus demonstrate no significant findings. Lungs/Pleura: There is no pneumothorax. No pleural effusion, interstitial edema or airspace consolidation. Emphysema and diffuse bronchial wall thickening. Several calcified granulomas identified. Musculoskeletal: No chest wall abnormality. No acute or significant osseous findings. Review of the MIP images confirms the above findings. CT ABDOMEN and PELVIS FINDINGS Hepatobiliary: Cirrhotic liver. Too small to characterize hypodensity in lateral segment measures 5 mm. No suspicious  enhancing liver lesions identified. Gallstones are identified measuring up to 8 mm. No gallbladder wall thickening. No significant bile duct dilatation. Pancreas: Changes of chronic pancreatitis identified. Diffuse intraductal and parenchymal calcifications are identified. There are several cystic lesions within the head and neck of pancreas. The largest is in the neck measuring 1.6 cm, image 41/4. Unchanged from previous exam. Spleen: Normal in size without focal abnormality. Adrenals/Urinary Tract: Adrenal glands are unremarkable. Kidneys are normal, without renal calculi, focal lesion, or hydronephrosis. Bladder is unremarkable. Stomach/Bowel: Stomach appears nondistended. No pathologic dilatation of the large or small bowel loops. Signs of previous resection of the cecum with right abdominal enterocolonic anastomosis. There is mild increase caliber of the mid small bowel loops which measure up to 3.2 cm, image 14/7. Transition to decreased caliber distal small bowel noted within the lower abdomen/pelvis. There is no pathologic dilatation of the colon. Mild wall thickening involving the descending colon is nonspecific and may reflect hepatic colopathy. Vascular/Lymphatic: Aortic atherosclerosis. Decreased caliber of the patent extrahepatic portal vein. Portal venous confluence and SMV remain patent. Splenic vein is patent. Upper abdominal varices. No abdominopelvic adenopathy. Reproductive: Prostate is unremarkable. Other: Moderate diffuse abdominal ascites within the abdomen and pelvis, similar in volume to the previous exam. No signs of pneumoperitoneum. No discrete fluid collections. Musculoskeletal: No acute or significant osseous findings. Review of the MIP images confirms the above findings. IMPRESSION: 1. No evidence for acute pulmonary embolus. 2. Morphologic features of the liver compatible with cirrhosis. 3. Moderate diffuse abdominal ascites, similar in volume to the previous exam. 4. Mild increase  caliber of the mid small bowel loops with transition to decreased caliber distal small bowel loops noted within the lower abdomen/pelvis. Findings may reflect partial small bowel obstruction versus ileus versus enteritis. 5. Mild wall thickening involving the descending colon is nonspecific and may reflect hepatic colopathy.  6. Chronic pancreatitis with several cystic lesions within the head and neck of pancreas. The largest is in the neck measuring 1.6 cm. Recommend follow-up pancreatic protocol CT or MRI in 12 months. 7. Gallstones. 8.  Aortic Atherosclerosis (ICD10-I70.0). Electronically Signed   By: Signa Kell M.D.   On: 05/08/2022 10:12   CT ABDOMEN PELVIS W CONTRAST  Result Date: 05/11/2022 CLINICAL DATA:  Abdominal pain, acute. Chest pain and shortness of breath. EXAM: CT ANGIOGRAPHY CHEST CT ABDOMEN AND PELVIS WITH CONTRAST TECHNIQUE: Multidetector CT imaging of the chest was performed using the standard protocol during bolus administration of intravenous contrast. Multiplanar CT image reconstructions and MIPs were obtained to evaluate the vascular anatomy. Multidetector CT imaging of the abdomen and pelvis was performed using the standard protocol during bolus administration of intravenous contrast. RADIATION DOSE REDUCTION: This exam was performed according to the departmental dose-optimization program which includes automated exposure control, adjustment of the mA and/or kV according to patient size and/or use of iterative reconstruction technique. CONTRAST:  OMNIPAQUE IOHEXOL 350 MG/ML SOLN COMPARISON:  CT AP 04/19/2022 an CTA chest 04/11/2022 FINDINGS: CTA CHEST FINDINGS Cardiovascular: Satisfactory opacification of the pulmonary arteries to the segmental level. No evidence of pulmonary embolism. Normal heart size. No pericardial effusion. Aortic atherosclerosis and coronary artery calcifications. Mediastinum/Nodes: No enlarged mediastinal, hilar, or axillary lymph nodes. Thyroid gland,  trachea, and esophagus demonstrate no significant findings. Lungs/Pleura: There is no pneumothorax. No pleural effusion, interstitial edema or airspace consolidation. Emphysema and diffuse bronchial wall thickening. Several calcified granulomas identified. Musculoskeletal: No chest wall abnormality. No acute or significant osseous findings. Review of the MIP images confirms the above findings. CT ABDOMEN and PELVIS FINDINGS Hepatobiliary: Cirrhotic liver. Too small to characterize hypodensity in lateral segment measures 5 mm. No suspicious enhancing liver lesions identified. Gallstones are identified measuring up to 8 mm. No gallbladder wall thickening. No significant bile duct dilatation. Pancreas: Changes of chronic pancreatitis identified. Diffuse intraductal and parenchymal calcifications are identified. There are several cystic lesions within the head and neck of pancreas. The largest is in the neck measuring 1.6 cm, image 41/4. Unchanged from previous exam. Spleen: Normal in size without focal abnormality. Adrenals/Urinary Tract: Adrenal glands are unremarkable. Kidneys are normal, without renal calculi, focal lesion, or hydronephrosis. Bladder is unremarkable. Stomach/Bowel: Stomach appears nondistended. No pathologic dilatation of the large or small bowel loops. Signs of previous resection of the cecum with right abdominal enterocolonic anastomosis. There is mild increase caliber of the mid small bowel loops which measure up to 3.2 cm, image 14/7. Transition to decreased caliber distal small bowel noted within the lower abdomen/pelvis. There is no pathologic dilatation of the colon. Mild wall thickening involving the descending colon is nonspecific and may reflect hepatic colopathy. Vascular/Lymphatic: Aortic atherosclerosis. Decreased caliber of the patent extrahepatic portal vein. Portal venous confluence and SMV remain patent. Splenic vein is patent. Upper abdominal varices. No abdominopelvic adenopathy.  Reproductive: Prostate is unremarkable. Other: Moderate diffuse abdominal ascites within the abdomen and pelvis, similar in volume to the previous exam. No signs of pneumoperitoneum. No discrete fluid collections. Musculoskeletal: No acute or significant osseous findings. Review of the MIP images confirms the above findings. IMPRESSION: 1. No evidence for acute pulmonary embolus. 2. Morphologic features of the liver compatible with cirrhosis. 3. Moderate diffuse abdominal ascites, similar in volume to the previous exam. 4. Mild increase caliber of the mid small bowel loops with transition to decreased caliber distal small bowel loops noted within the lower abdomen/pelvis. Findings may  reflect partial small bowel obstruction versus ileus versus enteritis. 5. Mild wall thickening involving the descending colon is nonspecific and may reflect hepatic colopathy. 6. Chronic pancreatitis with several cystic lesions within the head and neck of pancreas. The largest is in the neck measuring 1.6 cm. Recommend follow-up pancreatic protocol CT or MRI in 12 months. 7. Gallstones. 8.  Aortic Atherosclerosis (ICD10-I70.0). Electronically Signed   By: Signa Kell M.D.   On: 05/07/2022 10:12   CT HEAD WO CONTRAST ( )  Result Date: 05/09/2022 CLINICAL DATA:  Headaches and neck pain, initial encounter EXAM: CT HEAD WITHOUT CONTRAST CT CERVICAL SPINE WITHOUT CONTRAST TECHNIQUE: Multidetector CT imaging of the head and cervical spine was performed following the standard protocol without intravenous contrast. Multiplanar CT image reconstructions of the cervical spine were also generated. RADIATION DOSE REDUCTION: This exam was performed according to the departmental dose-optimization program which includes automated exposure control, adjustment of the mA and/or kV according to patient size and/or use of iterative reconstruction technique. COMPARISON:  02/03/2020 FINDINGS: CT HEAD FINDINGS Brain: No evidence of acute infarction,  hemorrhage, hydrocephalus, extra-axial collection or mass lesion/mass effect. Mild atrophic changes and chronic white matter ischemic changes are again identified. Vascular: No hyperdense vessel or unexpected calcification. Skull: Normal. Negative for fracture or focal lesion. Sinuses/Orbits: No acute finding. Other: None. CT CERVICAL SPINE FINDINGS Alignment: Within normal limits. Skull base and vertebrae: 7 cervical segments are well visualized. Vertebral body height is well maintained. Multilevel osteophytic changes and facet hypertrophic changes are seen. No acute fracture is noted. No acute facet abnormality is seen. The odontoid is within normal limits. Soft tissues and spinal canal: Surrounding soft tissue structures are within normal limits. Upper chest: Mild emphysematous changes are noted in the lung apices. Other: None IMPRESSION: CT of the head: Chronic atrophic and ischemic changes without acute abnormality. CT of the cervical spine: Multilevel degenerative change without acute bony abnormality. Electronically Signed   By: Alcide Clever M.D.   On: 05/11/2022 09:52   CT Cervical Spine Wo Contrast  Result Date: 04/19/2022 CLINICAL DATA:  Headaches and neck pain, initial encounter EXAM: CT HEAD WITHOUT CONTRAST CT CERVICAL SPINE WITHOUT CONTRAST TECHNIQUE: Multidetector CT imaging of the head and cervical spine was performed following the standard protocol without intravenous contrast. Multiplanar CT image reconstructions of the cervical spine were also generated. RADIATION DOSE REDUCTION: This exam was performed according to the departmental dose-optimization program which includes automated exposure control, adjustment of the mA and/or kV according to patient size and/or use of iterative reconstruction technique. COMPARISON:  02/03/2020 FINDINGS: CT HEAD FINDINGS Brain: No evidence of acute infarction, hemorrhage, hydrocephalus, extra-axial collection or mass lesion/mass effect. Mild atrophic changes  and chronic white matter ischemic changes are again identified. Vascular: No hyperdense vessel or unexpected calcification. Skull: Normal. Negative for fracture or focal lesion. Sinuses/Orbits: No acute finding. Other: None. CT CERVICAL SPINE FINDINGS Alignment: Within normal limits. Skull base and vertebrae: 7 cervical segments are well visualized. Vertebral body height is well maintained. Multilevel osteophytic changes and facet hypertrophic changes are seen. No acute fracture is noted. No acute facet abnormality is seen. The odontoid is within normal limits. Soft tissues and spinal canal: Surrounding soft tissue structures are within normal limits. Upper chest: Mild emphysematous changes are noted in the lung apices. Other: None IMPRESSION: CT of the head: Chronic atrophic and ischemic changes without acute abnormality. CT of the cervical spine: Multilevel degenerative change without acute bony abnormality. Electronically Signed   By: Alcide Clever  M.D.   On: 04/27/2022 09:52   DG Chest Port 1 View  Result Date: 04/22/2022 CLINICAL DATA:  Chest pain and nausea EXAM: PORTABLE CHEST 1 VIEW COMPARISON:  Chest radiograph 05/02/2022, CTA chest 04/11/2022 FINDINGS: The cardiomediastinal silhouette is normal There is no focal consolidation or pulmonary edema. There is no pleural effusion or pneumothorax There is no acute osseous abnormality. IMPRESSION: Stable chest with no radiographic evidence of acute cardiopulmonary process. Electronically Signed   By: Lesia Hausen M.D.   On: 05/05/2022 08:07   DG Chest 2 View  Result Date: 05/02/2022 CLINICAL DATA:  Shortness of breath. EXAM: CHEST - 2 VIEW COMPARISON:  04/19/2022. FINDINGS: Clear lungs. Normal heart size and mediastinal contours. No pleural effusion or pneumothorax. Visualized bones and upper abdomen are unremarkable. IMPRESSION: No evidence of acute cardiopulmonary disease. Electronically Signed   By: Orvan Falconer M.D.   On: 05/02/2022 13:07     Microbiology: Recent Results (from the past 240 hour(s))  Culture, blood (Routine X 2) w Reflex to ID Panel     Status: None (Preliminary result)   Collection Time: 05/23/22  9:23 AM   Specimen: BLOOD  Result Value Ref Range Status   Specimen Description BLOOD BLOOD LEFT ARM  Final   Special Requests   Final    BOTTLES DRAWN AEROBIC AND ANAEROBIC Blood Culture adequate volume   Culture   Final    NO GROWTH 4 DAYS Performed at [facility], [address], [place], [place] [address]    Report Status PENDING  Incomplete  Culture, blood (Routine X 2) w Reflex to ID Panel     Status: None (Preliminary result)   Collection Time: 05/23/22  9:30 AM   Specimen: BLOOD  Result Value Ref Range Status   Specimen Description BLOOD BLOOD LEFT HAND  Final   Special Requests   Final    BOTTLES DRAWN AEROBIC ONLY Blood Culture adequate volume   Culture   Final    NO GROWTH 4 DAYS Performed at [facility], [address] [place] [address], [place], [place] [address]    Report Status PENDING  Incomplete  Body fluid culture w Gram Stain     Status: None (Preliminary result)   Collection Time: 05/25/22  4:01 PM   Specimen: PATH Cytology Peritoneal fluid  Result Value Ref Range Status   Specimen Description   Final    PERITONEAL Performed at [facility], [address], [place], [place] [address]    Special Requests   Final    NONE Performed at [facility], [address] [place], [place] [address]    Gram Stain   Final    CYTOSPIN SMEAR WBC PRESENT,BOTH PMN AND MONONUCLEAR NO ORGANISMS SEEN    Culture   Final    NO GROWTH 2 DAYS Performed at [facility], [address], [place], [place] [address]    Report Status PENDING  Incomplete    Time spent: Patient not seen on the day of expiration.  Signed: Alford Highland, MD 05/18/2022

## 2022-06-17 NOTE — Progress Notes (Signed)
   Jun 24, 2022 0331  Attending Physican Contact  Attending Physician Notified Y  Attending Physician (First and Last Name) Manuela Schwartz, NP  Post Mortem Checklist  Date of Death 06-24-2022  Time of Death 0313  Pronounced By Manuela Schwartz, NP  Next of kin notified Yes  Name of next of kin notified of death Amal Palmatier  Contact Person's Relationship to Patient Daughter  Contact Person's Phone Number [phone]  Family Communication Notes Daughter at bedside  Was the patient a No Code Blue or a Limited Code Blue? Yes  Did the patient die unattended? No  Patient restrained? Not applicable  [facility] (previously known as [facility])  Notification Date 24-Jun-2022  Notification Time 0345  [facility] Number [phone]  Is patient a potential donor? N  Autopsy  Autopsy requested by MD or Family ( Non ME Case) N/A  Medical Examiner  Is this a medical examiner's case? N   This RN assessed patient throughout the shift with findings of soft BP, lower than normal, audible wheezing/wet breath sounds along with elevated blood glucose. NP notified with recommendations of additional insulin, IV NS bolus and STAT labs to be drawn. RT administered breathing treatment in attempts to elevate patient's oxygen saturation. Patient began to have agonal breathing, BP continued to decline. EKG also ordered indicating abnormal EKG.

## 2022-06-17 DEATH — deceased

## 2022-06-19 ENCOUNTER — Ambulatory Visit: Payer: 59 | Admitting: Gastroenterology
